# Patient Record
Sex: Female | Born: 1955
Health system: Southern US, Community
[De-identification: ages and names within clinical notes are randomized; demographics above are authoritative.]

## PROBLEM LIST (undated history)

## (undated) ENCOUNTER — Ambulatory Visit: Admission: EM | Source: Home / Self Care

## (undated) DIAGNOSIS — F329 Major depressive disorder, single episode, unspecified: Secondary | ICD-10-CM

## (undated) DIAGNOSIS — K219 Gastro-esophageal reflux disease without esophagitis: Secondary | ICD-10-CM

## (undated) DIAGNOSIS — J449 Chronic obstructive pulmonary disease, unspecified: Secondary | ICD-10-CM

## (undated) DIAGNOSIS — I739 Peripheral vascular disease, unspecified: Secondary | ICD-10-CM

## (undated) DIAGNOSIS — F32A Depression, unspecified: Secondary | ICD-10-CM

## (undated) DIAGNOSIS — C801 Malignant (primary) neoplasm, unspecified: Secondary | ICD-10-CM

## (undated) DIAGNOSIS — E785 Hyperlipidemia, unspecified: Secondary | ICD-10-CM

## (undated) DIAGNOSIS — R7303 Prediabetes: Secondary | ICD-10-CM

## (undated) DIAGNOSIS — M1990 Unspecified osteoarthritis, unspecified site: Secondary | ICD-10-CM

## (undated) DIAGNOSIS — I7409 Other arterial embolism and thrombosis of abdominal aorta: Secondary | ICD-10-CM

## (undated) DIAGNOSIS — I10 Essential (primary) hypertension: Secondary | ICD-10-CM

## (undated) HISTORY — PX: BREAST LUMPECTOMY: SHX2

## (undated) HISTORY — DX: Peripheral vascular disease, unspecified: I73.9

## (undated) HISTORY — DX: Other arterial embolism and thrombosis of abdominal aorta: I74.09

## (undated) HISTORY — PX: BREAST SURGERY: SHX581

---

## 1978-03-03 HISTORY — PX: APPENDECTOMY: SHX54

## 1989-03-03 HISTORY — PX: TUBAL LIGATION: SHX77

## 1999-06-29 ENCOUNTER — Encounter: Payer: Self-pay | Admitting: *Deleted

## 1999-06-29 ENCOUNTER — Emergency Department (HOSPITAL_COMMUNITY): Admission: EM | Admit: 1999-06-29 | Discharge: 1999-06-29 | Payer: Self-pay | Admitting: *Deleted

## 1999-11-10 ENCOUNTER — Emergency Department (HOSPITAL_COMMUNITY): Admission: EM | Admit: 1999-11-10 | Discharge: 1999-11-10 | Payer: Self-pay | Admitting: Internal Medicine

## 2006-08-24 ENCOUNTER — Emergency Department (HOSPITAL_COMMUNITY): Admission: EM | Admit: 2006-08-24 | Discharge: 2006-08-24 | Payer: Self-pay | Admitting: Emergency Medicine

## 2006-10-15 ENCOUNTER — Emergency Department (HOSPITAL_COMMUNITY): Admission: EM | Admit: 2006-10-15 | Discharge: 2006-10-15 | Payer: Self-pay | Admitting: Emergency Medicine

## 2006-11-26 ENCOUNTER — Ambulatory Visit: Payer: Self-pay | Admitting: Internal Medicine

## 2006-11-26 ENCOUNTER — Ambulatory Visit: Payer: Self-pay | Admitting: *Deleted

## 2006-11-26 LAB — CONVERTED CEMR LAB
ALT: 15 units/L (ref 0–35)
AST: 12 units/L (ref 0–37)
Albumin: 4.1 g/dL (ref 3.5–5.2)
Alkaline Phosphatase: 59 units/L (ref 39–117)
BUN: 18 mg/dL (ref 6–23)
CO2: 24 meq/L (ref 19–32)
Calcium: 9.3 mg/dL (ref 8.4–10.5)
Chloride: 107 meq/L (ref 96–112)
Cholesterol: 201 mg/dL — ABNORMAL HIGH (ref 0–200)
Creatinine, Ser: 0.71 mg/dL (ref 0.40–1.20)
Glucose, Bld: 86 mg/dL (ref 70–99)
HDL: 56 mg/dL (ref >39)
LDL Cholesterol: 126 mg/dL — ABNORMAL HIGH (ref 0–99)
Potassium: 4.1 meq/L (ref 3.5–5.3)
Sodium: 140 meq/L (ref 135–145)
TSH: 1.03 microintl units/mL (ref 0.350–5.50)
Total Bilirubin: 0.5 mg/dL (ref 0.3–1.2)
Total CHOL/HDL Ratio: 3.6
Total Protein: 7.2 g/dL (ref 6.0–8.3)
Triglycerides: 94 mg/dL (ref <150)
VLDL: 19 mg/dL (ref 0–40)

## 2007-01-23 ENCOUNTER — Emergency Department (HOSPITAL_COMMUNITY): Admission: EM | Admit: 2007-01-23 | Discharge: 2007-01-23 | Payer: Self-pay | Admitting: Emergency Medicine

## 2007-02-17 ENCOUNTER — Ambulatory Visit: Payer: Self-pay | Admitting: Internal Medicine

## 2007-03-04 DIAGNOSIS — C801 Malignant (primary) neoplasm, unspecified: Secondary | ICD-10-CM

## 2007-03-04 HISTORY — DX: Malignant (primary) neoplasm, unspecified: C80.1

## 2007-04-08 ENCOUNTER — Ambulatory Visit (HOSPITAL_COMMUNITY): Admission: RE | Admit: 2007-04-08 | Discharge: 2007-04-08 | Payer: Self-pay | Admitting: Family Medicine

## 2007-05-17 ENCOUNTER — Encounter: Admission: RE | Admit: 2007-05-17 | Discharge: 2007-05-17 | Payer: Self-pay | Admitting: Family Medicine

## 2007-06-09 ENCOUNTER — Ambulatory Visit: Payer: Self-pay | Admitting: Internal Medicine

## 2007-06-11 ENCOUNTER — Encounter: Admission: RE | Admit: 2007-06-11 | Discharge: 2007-06-11 | Payer: Self-pay | Admitting: Family Medicine

## 2007-06-11 ENCOUNTER — Encounter (INDEPENDENT_AMBULATORY_CARE_PROVIDER_SITE_OTHER): Payer: Self-pay | Admitting: Radiology

## 2007-06-17 ENCOUNTER — Encounter: Payer: Self-pay | Admitting: Family Medicine

## 2007-06-17 ENCOUNTER — Ambulatory Visit: Payer: Self-pay | Admitting: Internal Medicine

## 2007-06-17 LAB — CONVERTED CEMR LAB
BUN: 18 mg/dL (ref 6–23)
CO2: 30 meq/L (ref 19–32)
Calcium: 9.6 mg/dL (ref 8.4–10.5)
Chloride: 101 meq/L (ref 96–112)
Creatinine, Ser: 0.86 mg/dL (ref 0.40–1.20)
Glucose, Bld: 87 mg/dL (ref 70–99)
Potassium: 3.1 meq/L — ABNORMAL LOW (ref 3.5–5.3)
Sodium: 143 meq/L (ref 135–145)

## 2007-06-24 ENCOUNTER — Encounter: Admission: RE | Admit: 2007-06-24 | Discharge: 2007-06-24 | Payer: Self-pay | Admitting: Family Medicine

## 2007-07-02 ENCOUNTER — Encounter: Admission: RE | Admit: 2007-07-02 | Discharge: 2007-07-02 | Payer: Self-pay | Admitting: General Surgery

## 2007-07-05 ENCOUNTER — Encounter (HOSPITAL_BASED_OUTPATIENT_CLINIC_OR_DEPARTMENT_OTHER): Payer: Self-pay | Admitting: General Surgery

## 2007-07-05 ENCOUNTER — Encounter: Admission: RE | Admit: 2007-07-05 | Discharge: 2007-07-05 | Payer: Self-pay | Admitting: General Surgery

## 2007-07-05 ENCOUNTER — Ambulatory Visit (HOSPITAL_BASED_OUTPATIENT_CLINIC_OR_DEPARTMENT_OTHER): Admission: RE | Admit: 2007-07-05 | Discharge: 2007-07-05 | Payer: Self-pay | Admitting: General Surgery

## 2007-07-21 ENCOUNTER — Ambulatory Visit: Payer: Self-pay | Admitting: Internal Medicine

## 2007-07-27 ENCOUNTER — Ambulatory Visit: Admission: RE | Admit: 2007-07-27 | Discharge: 2007-10-12 | Payer: Self-pay | Admitting: Radiation Oncology

## 2007-08-16 ENCOUNTER — Ambulatory Visit: Payer: Self-pay | Admitting: Oncology

## 2007-08-16 LAB — CBC WITH DIFFERENTIAL/PLATELET
BASO%: 0.5 % (ref 0.0–2.0)
Basophils Absolute: 0 10*3/uL (ref 0.0–0.1)
EOS%: 2 % (ref 0.0–7.0)
Eosinophils Absolute: 0.1 10*3/uL (ref 0.0–0.5)
HCT: 34.7 % — ABNORMAL LOW (ref 34.8–46.6)
HGB: 12.1 g/dL (ref 11.6–15.9)
LYMPH%: 34 % (ref 14.0–48.0)
MCH: 32 pg (ref 26.0–34.0)
MCHC: 34.8 g/dL (ref 32.0–36.0)
MCV: 92 fL (ref 81.0–101.0)
MONO#: 0.8 10*3/uL (ref 0.1–0.9)
MONO%: 12.4 % (ref 0.0–13.0)
NEUT#: 3.1 10*3/uL (ref 1.5–6.5)
NEUT%: 51.1 % (ref 39.6–76.8)
Platelets: 257 10*3/uL (ref 145–400)
RBC: 3.77 10*6/uL (ref 3.70–5.32)
RDW: 13.7 % (ref 11.3–14.5)
WBC: 6.1 10*3/uL (ref 3.9–10.0)
lymph#: 2.1 10*3/uL (ref 0.9–3.3)

## 2007-08-17 LAB — COMPREHENSIVE METABOLIC PANEL
ALT: 41 U/L — ABNORMAL HIGH (ref 0–35)
AST: 28 U/L (ref 0–37)
Albumin: 4.4 g/dL (ref 3.5–5.2)
Alkaline Phosphatase: 88 U/L (ref 39–117)
BUN: 15 mg/dL (ref 6–23)
CO2: 24 mEq/L (ref 19–32)
Calcium: 9.9 mg/dL (ref 8.4–10.5)
Chloride: 104 mEq/L (ref 96–112)
Creatinine, Ser: 0.83 mg/dL (ref 0.40–1.20)
Glucose, Bld: 90 mg/dL (ref 70–99)
Potassium: 3.7 mEq/L (ref 3.5–5.3)
Sodium: 140 mEq/L (ref 135–145)
Total Bilirubin: 0.3 mg/dL (ref 0.3–1.2)
Total Protein: 7.6 g/dL (ref 6.0–8.3)

## 2007-08-17 LAB — CANCER ANTIGEN 27.29: CA 27.29: 40 U/mL — ABNORMAL HIGH (ref 0–39)

## 2007-08-17 LAB — LACTATE DEHYDROGENASE: LDH: 206 U/L (ref 94–250)

## 2007-08-17 LAB — VITAMIN D 25 HYDROXY (VIT D DEFICIENCY, FRACTURES): Vit D, 25-Hydroxy: 17 ng/mL — ABNORMAL LOW (ref 30–89)

## 2007-09-02 ENCOUNTER — Ambulatory Visit (HOSPITAL_COMMUNITY): Admission: RE | Admit: 2007-09-02 | Discharge: 2007-09-02 | Payer: Self-pay | Admitting: Family Medicine

## 2007-10-05 ENCOUNTER — Ambulatory Visit: Payer: Self-pay | Admitting: Internal Medicine

## 2007-10-05 ENCOUNTER — Encounter: Payer: Self-pay | Admitting: Family Medicine

## 2007-10-05 LAB — CONVERTED CEMR LAB
BUN: 19 mg/dL (ref 6–23)
CO2: 21 meq/L (ref 19–32)
Calcium: 9.3 mg/dL (ref 8.4–10.5)
Chloride: 105 meq/L (ref 96–112)
Creatinine, Ser: 0.92 mg/dL (ref 0.40–1.20)
Glucose, Bld: 108 mg/dL — ABNORMAL HIGH (ref 70–99)
Potassium: 3.8 meq/L (ref 3.5–5.3)
Sodium: 140 meq/L (ref 135–145)

## 2007-10-13 ENCOUNTER — Encounter: Payer: Self-pay | Admitting: Family Medicine

## 2007-10-13 ENCOUNTER — Ambulatory Visit: Payer: Self-pay | Admitting: Internal Medicine

## 2007-10-13 LAB — CONVERTED CEMR LAB
ALT: 74 units/L — ABNORMAL HIGH (ref 0–35)
AST: 44 units/L — ABNORMAL HIGH (ref 0–37)
Albumin: 4.6 g/dL (ref 3.5–5.2)
Alkaline Phosphatase: 113 units/L (ref 39–117)
BUN: 12 mg/dL (ref 6–23)
Basophils Absolute: 0.1 10*3/uL (ref 0.0–0.1)
Basophils Relative: 2 % — ABNORMAL HIGH (ref 0–1)
CO2: 23 meq/L (ref 19–32)
Calcium: 9.8 mg/dL (ref 8.4–10.5)
Chloride: 101 meq/L (ref 96–112)
Cholesterol: 222 mg/dL — ABNORMAL HIGH (ref 0–200)
Creatinine, Ser: 0.9 mg/dL (ref 0.40–1.20)
Eosinophils Absolute: 0.1 10*3/uL (ref 0.0–0.7)
Eosinophils Relative: 4 % (ref 0–5)
Glucose, Bld: 92 mg/dL (ref 70–99)
HCT: 39.1 % (ref 36.0–46.0)
HDL: 58 mg/dL (ref >39)
Hemoglobin: 12.9 g/dL (ref 12.0–15.0)
LDL Cholesterol: 128 mg/dL — ABNORMAL HIGH (ref 0–99)
Lymphocytes Relative: 32 % (ref 12–46)
Lymphs Abs: 1.1 10*3/uL (ref 0.7–4.0)
MCHC: 33 g/dL (ref 30.0–36.0)
MCV: 96.3 fL (ref 78.0–100.0)
Monocytes Absolute: 0.7 10*3/uL (ref 0.1–1.0)
Monocytes Relative: 20 % — ABNORMAL HIGH (ref 3–12)
Neutro Abs: 1.5 10*3/uL — ABNORMAL LOW (ref 1.7–7.7)
Neutrophils Relative %: 42 % — ABNORMAL LOW (ref 43–77)
Platelets: 238 10*3/uL (ref 150–400)
Potassium: 4.1 meq/L (ref 3.5–5.3)
RBC: 4.06 M/uL (ref 3.87–5.11)
RDW: 15.4 % (ref 11.5–15.5)
Sed Rate: 15 mm/hr (ref 0–22)
Sodium: 138 meq/L (ref 135–145)
TSH: 3.096 microintl units/mL (ref 0.350–4.50)
Total Bilirubin: 0.5 mg/dL (ref 0.3–1.2)
Total CHOL/HDL Ratio: 3.8
Total Protein: 8.2 g/dL (ref 6.0–8.3)
Triglycerides: 179 mg/dL — ABNORMAL HIGH (ref <150)
VLDL: 36 mg/dL (ref 0–40)
WBC: 3.4 10*3/uL — ABNORMAL LOW (ref 4.0–10.5)

## 2007-11-09 ENCOUNTER — Ambulatory Visit: Payer: Self-pay | Admitting: Oncology

## 2008-01-21 ENCOUNTER — Ambulatory Visit: Payer: Self-pay | Admitting: Internal Medicine

## 2008-04-11 ENCOUNTER — Encounter: Admission: RE | Admit: 2008-04-11 | Discharge: 2008-04-11 | Payer: Self-pay | Admitting: Oncology

## 2008-04-19 ENCOUNTER — Ambulatory Visit: Payer: Self-pay | Admitting: Family Medicine

## 2008-05-02 ENCOUNTER — Ambulatory Visit: Payer: Self-pay | Admitting: Oncology

## 2008-06-12 ENCOUNTER — Ambulatory Visit (HOSPITAL_COMMUNITY): Admission: RE | Admit: 2008-06-12 | Discharge: 2008-06-12 | Payer: Self-pay | Admitting: Oncology

## 2008-06-12 LAB — CBC WITH DIFFERENTIAL/PLATELET
BASO%: 0.7 % (ref 0.0–2.0)
Basophils Absolute: 0 10*3/uL (ref 0.0–0.1)
EOS%: 2.9 % (ref 0.0–7.0)
Eosinophils Absolute: 0.1 10*3/uL (ref 0.0–0.5)
HCT: 40.2 % (ref 34.8–46.6)
HGB: 13.8 g/dL (ref 11.6–15.9)
LYMPH%: 36.7 % (ref 14.0–49.7)
MCH: 30.9 pg (ref 25.1–34.0)
MCHC: 34.3 g/dL (ref 31.5–36.0)
MCV: 90.1 fL (ref 79.5–101.0)
MONO#: 0.6 10*3/uL (ref 0.1–0.9)
MONO%: 14.4 % — ABNORMAL HIGH (ref 0.0–14.0)
NEUT#: 1.9 10*3/uL (ref 1.5–6.5)
NEUT%: 45.3 % (ref 38.4–76.8)
Platelets: 230 10*3/uL (ref 145–400)
RBC: 4.46 10*6/uL (ref 3.70–5.45)
RDW: 13.9 % (ref 11.2–14.5)
WBC: 4.2 10*3/uL (ref 3.9–10.3)
lymph#: 1.5 10*3/uL (ref 0.9–3.3)
nRBC: 0 % (ref 0–0)

## 2008-06-13 LAB — COMPREHENSIVE METABOLIC PANEL
ALT: 21 U/L (ref 0–35)
AST: 20 U/L (ref 0–37)
Albumin: 4.5 g/dL (ref 3.5–5.2)
Alkaline Phosphatase: 89 U/L (ref 39–117)
BUN: 17 mg/dL (ref 6–23)
CO2: 26 mEq/L (ref 19–32)
Calcium: 9.7 mg/dL (ref 8.4–10.5)
Chloride: 102 mEq/L (ref 96–112)
Creatinine, Ser: 0.98 mg/dL (ref 0.40–1.20)
Glucose, Bld: 87 mg/dL (ref 70–99)
Potassium: 3.6 mEq/L (ref 3.5–5.3)
Sodium: 138 mEq/L (ref 135–145)
Total Bilirubin: 0.3 mg/dL (ref 0.3–1.2)
Total Protein: 7.8 g/dL (ref 6.0–8.3)

## 2008-06-13 LAB — CANCER ANTIGEN 27.29: CA 27.29: 45 U/mL — ABNORMAL HIGH (ref 0–39)

## 2008-06-13 LAB — VITAMIN D 25 HYDROXY (VIT D DEFICIENCY, FRACTURES): Vit D, 25-Hydroxy: 13 ng/mL — ABNORMAL LOW (ref 30–89)

## 2008-06-13 LAB — LACTATE DEHYDROGENASE: LDH: 186 U/L (ref 94–250)

## 2008-06-14 ENCOUNTER — Ambulatory Visit: Payer: Self-pay | Admitting: Internal Medicine

## 2008-12-27 ENCOUNTER — Ambulatory Visit: Payer: Self-pay | Admitting: Oncology

## 2009-03-05 ENCOUNTER — Ambulatory Visit: Payer: Self-pay | Admitting: Oncology

## 2010-07-16 NOTE — Op Note (Signed)
Brandy Stevenson, Brandy Stevenson             ACCOUNT NO.:  [id number]   MEDICAL RECORD NO.:  [id number]          PATIENT TYPE:  AMB   LOCATION:  DSC                          FACILITY:  [facility]   PHYSICIAN:  [name], M.D.   DATE OF BIRTH:  [birth year]   DATE OF PROCEDURE:  07/05/2007  DATE OF DISCHARGE:                               OPERATIVE REPORT   PREOPERATIVE DIAGNOSIS:  Carcinoma of the left breast (ductal carcinoma  in situ).   POSTOPERATIVE DIAGNOSIS:  Carcinoma of the left breast (ductal carcinoma  in situ).   PATHOLOGY:  Pending.   PROCEDURE:  Needle localized lumpectomy, left breast.   SURGEON:  [name], MD   ASSISTANT:  OR nurse.   ANESTHESIA:  General.   The patient is a 55 year old female on routine screening mammogram was  noted to have an area of calcifications and distortion of the left  breast which on biopsy shows ductal carcinoma in situ.  The patient  comes to the operating room now for excision of this area after the  risks and potential benefits of surgery have been discussed.  All  questions answered and consent obtained.   The patient is positioned supine and following the induction of  satisfactory general anesthesia, the left breast is prepped and draped  to be included in a sterile operative field.  Positive identification of  the patient as Brandy Stevenson and the side as left side was carried  out.  The  localizing needle having been in place, I made a transverse  incision taking an ellipse of skin around the localizing needle, deep  into the skin and subcutaneous tissue, carrying the border all the way  down toward the chest wall.  Wide margins was taken around the needle  and the entire mass removed and x-rayed.  X-rays result showed the mass  to be well within the local area of the carcinoma.  Hemostasis was then  secured with electrocautery.  The breast was then irrigated with normal  saline.  Sponge and instrument counts were verified.   The breast tissues  were reapproximated with 2-0 Vicryl and 3-0 Vicryl.  The skin was closed  with running 5-0 Monocryl and reinforced with Steri-Strips.  Sterile  dressings were applied.  The anesthetic reversed.  The patient removed  from the operating room to the recovery room in stable condition,  tolerated the procedure well.      [name], M.D.  Electronically Signed     PB/MEDQ  D:  07/05/2007  T:  07/06/2007  Job:  [id number]

## 2010-11-27 LAB — COMPREHENSIVE METABOLIC PANEL
ALT: 128 — ABNORMAL HIGH
AST: 85 — ABNORMAL HIGH
Albumin: 3.9
Alkaline Phosphatase: 99
BUN: 11
CO2: 31
Calcium: 9.7
Chloride: 100
Creatinine, Ser: 0.76
GFR calc Af Amer: >60
GFR calc non Af Amer: >60
Glucose, Bld: 76
Potassium: 2.9 — ABNORMAL LOW
Sodium: 138
Total Bilirubin: 0.4
Total Protein: 7.7

## 2010-11-27 LAB — DIFFERENTIAL
Basophils Absolute: 0
Basophils Relative: 1
Eosinophils Absolute: 0.2
Eosinophils Relative: 3
Lymphocytes Relative: 36
Lymphs Abs: 2.3
Monocytes Absolute: 0.8
Monocytes Relative: 13 — ABNORMAL HIGH
Neutro Abs: 3.2
Neutrophils Relative %: 49

## 2010-11-27 LAB — CBC
HCT: 38.1
Hemoglobin: 12.9
MCHC: 33.8
MCV: 94.4
Platelets: 238
RBC: 4.03
RDW: 13.4
WBC: 6.5

## 2010-12-10 LAB — WOUND CULTURE: Gram Stain: NONE SEEN

## 2010-12-18 LAB — URINE CULTURE: Colony Count: 7000

## 2010-12-18 LAB — URINE MICROSCOPIC-ADD ON

## 2010-12-18 LAB — URINALYSIS, ROUTINE W REFLEX MICROSCOPIC
Bilirubin Urine: NEGATIVE
Glucose, UA: NEGATIVE
Hgb urine dipstick: NEGATIVE
Ketones, ur: NEGATIVE
Nitrite: NEGATIVE
Protein, ur: NEGATIVE
Specific Gravity, Urine: 1.021
Urobilinogen, UA: 1
pH: 6

## 2011-03-04 DIAGNOSIS — I739 Peripheral vascular disease, unspecified: Secondary | ICD-10-CM

## 2011-03-04 HISTORY — DX: Peripheral vascular disease, unspecified: I73.9

## 2011-05-22 ENCOUNTER — Telehealth: Payer: Self-pay | Admitting: *Deleted

## 2011-05-22 NOTE — Telephone Encounter (Signed)
left voice message to inform the patient of the new date and time on 06-05-2011 at 1:30pm with dr.rubin

## 2011-06-05 ENCOUNTER — Ambulatory Visit: Payer: Self-pay | Admitting: Oncology

## 2011-06-25 ENCOUNTER — Ambulatory Visit: Payer: Self-pay | Admitting: Oncology

## 2011-06-25 ENCOUNTER — Other Ambulatory Visit: Payer: Self-pay | Admitting: Lab

## 2011-07-02 ENCOUNTER — Encounter (HOSPITAL_COMMUNITY): Payer: Self-pay | Admitting: *Deleted

## 2011-07-02 ENCOUNTER — Emergency Department (HOSPITAL_COMMUNITY)
Admission: EM | Admit: 2011-07-02 | Discharge: 2011-07-02 | Disposition: A | Discharge status: Home / Self Care | Payer: Self-pay | Attending: Emergency Medicine | Admitting: Emergency Medicine

## 2011-07-02 DIAGNOSIS — Z79899 Other long term (current) drug therapy: Secondary | ICD-10-CM | POA: Insufficient documentation

## 2011-07-02 DIAGNOSIS — F17200 Nicotine dependence, unspecified, uncomplicated: Secondary | ICD-10-CM | POA: Insufficient documentation

## 2011-07-02 DIAGNOSIS — I10 Essential (primary) hypertension: Secondary | ICD-10-CM | POA: Insufficient documentation

## 2011-07-02 DIAGNOSIS — J4489 Other specified chronic obstructive pulmonary disease: Secondary | ICD-10-CM | POA: Insufficient documentation

## 2011-07-02 DIAGNOSIS — E785 Hyperlipidemia, unspecified: Secondary | ICD-10-CM | POA: Insufficient documentation

## 2011-07-02 DIAGNOSIS — J449 Chronic obstructive pulmonary disease, unspecified: Secondary | ICD-10-CM | POA: Insufficient documentation

## 2011-07-02 HISTORY — DX: Chronic obstructive pulmonary disease, unspecified: J44.9

## 2011-07-02 HISTORY — DX: Malignant (primary) neoplasm, unspecified: C80.1

## 2011-07-02 HISTORY — DX: Essential (primary) hypertension: I10

## 2011-07-02 HISTORY — DX: Hyperlipidemia, unspecified: E78.5

## 2011-07-02 LAB — POCT I-STAT, CHEM 8
BUN: 13 mg/dL (ref 6–23)
Calcium, Ion: 1.1 mmol/L — ABNORMAL LOW (ref 1.12–1.32)
Chloride: 106 mEq/L (ref 96–112)
Creatinine, Ser: 0.9 mg/dL (ref 0.50–1.10)
Glucose, Bld: 89 mg/dL (ref 70–99)
HCT: 36 % (ref 36.0–46.0)
Hemoglobin: 12.2 g/dL (ref 12.0–15.0)
Potassium: 3.6 mEq/L (ref 3.5–5.1)
Sodium: 140 mEq/L (ref 135–145)
TCO2: 27 mmol/L (ref 0–100)

## 2011-07-02 MED ORDER — OXYCODONE-ACETAMINOPHEN 5-325 MG PO TABS
1.0000 | ORAL_TABLET | Freq: Once | ORAL | Status: AC
  Administered 2011-07-02: 1 via ORAL
  Filled 2011-07-02: qty 1

## 2011-07-02 NOTE — ED Notes (Addendum)
Pt states she has no pain and does not know why she was sent here she is confused but answers questions

## 2011-07-02 NOTE — ED Notes (Signed)
Felt bad for about a week, went to MD yesterday, treated for HTN, increased meds for bp,

## 2011-07-02 NOTE — Discharge Instructions (Signed)
Followup with your doctor this week for your high blood pressure and possible medication adjustment Arterial Hypertension Arterial hypertension (high blood pressure) is a condition of elevated pressure in your blood vessels. Hypertension over a long period of time is a risk factor for strokes, heart attacks, and heart failure. It is also the leading cause of kidney (renal) failure.  CAUSES   In Adults -- Over 90% of all hypertension has no known cause. This is called essential or primary hypertension. In the other 10% of people with hypertension, the increase in blood pressure is caused by another disorder. This is called secondary hypertension. Important causes of secondary hypertension are:   Heavy alcohol use.   Obstructive sleep apnea.   Hyperaldosterosim (Conn's syndrome).   Steroid use.   Chronic kidney failure.   Hyperparathyroidism.   Medications.   Renal artery stenosis.   Pheochromocytoma.   Cushing's disease.   Coarctation of the aorta.   Scleroderma renal crisis.   Licorice (in excessive amounts).   Drugs (cocaine, methamphetamine).  Your caregiver can explain any items above that apply to you.  In Children -- Secondary hypertension is more common and should always be considered.   Pregnancy -- Few women of childbearing age have high blood pressure. However, up to 10% of them develop hypertension of pregnancy. Generally, this will not harm the woman. It may be a sign of 3 complications of pregnancy: preeclampsia, HELLP syndrome, and eclampsia. Follow up and control with medication is necessary.  SYMPTOMS   This condition normally does not produce any noticeable symptoms. It is usually found during a routine exam.   Malignant hypertension is a late problem of high blood pressure. It may have the following symptoms:   Headaches.   Blurred vision.   End-organ damage (this means your kidneys, heart, lungs, and other organs are being damaged).   Stressful  situations can increase the blood pressure. If a person with normal blood pressure has their blood pressure go up while being seen by their caregiver, this is often termed "white coat hypertension." Its importance is not known. It may be related with eventually developing hypertension or complications of hypertension.   Hypertension is often confused with mental tension, stress, and anxiety.  DIAGNOSIS  The diagnosis is made by 3 separate blood pressure measurements. They are taken at least 1 week apart from each other. If there is organ damage from hypertension, the diagnosis may be made without repeat measurements. Hypertension is usually identified by having blood pressure readings:  Above 140/90 mmHg measured in both arms, at 3 separate times, over a couple weeks.   Over 130/80 mmHg should be considered a risk factor and may require treatment in patients with diabetes.  Blood pressure readings over 120/80 mmHg are called "pre-hypertension" even in non-diabetic patients. To get a true blood pressure measurement, use the following guidelines. Be aware of the factors that can alter blood pressure readings.  Take measurements at least 1 hour after caffeine.   Take measurements 30 minutes after smoking and without any stress. This is another reason to quit smoking - it raises your blood pressure.   Use a proper cuff size. Ask your caregiver if you are not sure about your cuff size.   Most home blood pressure cuffs are automatic. They will measure systolic and diastolic pressures. The systolic pressure is the pressure reading at the start of sounds. Diastolic pressure is the pressure at which the sounds disappear. If you are elderly, measure pressures in multiple  postures. Try sitting, lying or standing.   Sit at rest for a minimum of 5 minutes before taking measurements.   You should not be on any medications like decongestants. These are found in many cold medications.   Record your blood  pressure readings and review them with your caregiver.  If you have hypertension:  Your caregiver may do tests to be sure you do not have secondary hypertension (see "causes" above).   Your caregiver may also look for signs of metabolic syndrome. This is also called Syndrome X or Insulin Resistance Syndrome. You may have this syndrome if you have type 2 diabetes, abdominal obesity, and abnormal blood lipids in addition to hypertension.   Your caregiver will take your medical and family history and perform a physical exam.   Diagnostic tests may include blood tests (for glucose, cholesterol, potassium, and kidney function), a urinalysis, or an EKG. Other tests may also be necessary depending on your condition.  PREVENTION  There are important lifestyle issues that you can adopt to reduce your chance of developing hypertension:  Maintain a normal weight.   Limit the amount of salt (sodium) in your diet.   Exercise often.   Limit alcohol intake.   Get enough potassium in your diet. Discuss specific advice with your caregiver.   Follow a DASH diet (dietary approaches to stop hypertension). This diet is rich in fruits, vegetables, and low-fat dairy products, and avoids certain fats.  PROGNOSIS  Essential hypertension cannot be cured. Lifestyle changes and medical treatment can lower blood pressure and reduce complications. The prognosis of secondary hypertension depends on the underlying cause. Many people whose hypertension is controlled with medicine or lifestyle changes can live a normal, healthy life.  RISKS AND COMPLICATIONS  While high blood pressure alone is not an illness, it often requires treatment due to its short- and long-term effects on many organs. Hypertension increases your risk for:  CVAs or strokes (cerebrovascular accident).   Heart failure due to chronically high blood pressure (hypertensive cardiomyopathy).   Heart attack (myocardial infarction).   Damage to the  retina (hypertensive retinopathy).   Kidney failure (hypertensive nephropathy).  Your caregiver can explain list items above that apply to you. Treatment of hypertension can significantly reduce the risk of complications. TREATMENT   For overweight patients, weight loss and regular exercise are recommended. Physical fitness lowers blood pressure.   Mild hypertension is usually treated with diet and exercise. A diet rich in fruits and vegetables, fat-free dairy products, and foods low in fat and salt (sodium) can help lower blood pressure. Decreasing salt intake decreases blood pressure in a 1/3 of people.   Stop smoking if you are a smoker.  The steps above are highly effective in reducing blood pressure. While these actions are easy to suggest, they are difficult to achieve. Most patients with moderate or severe hypertension end up requiring medications to bring their blood pressure down to a normal level. There are several classes of medications for treatment. Blood pressure pills (antihypertensives) will lower blood pressure by their different actions. Lowering the blood pressure by 10 mmHg may decrease the risk of complications by as much as 25%. The goal of treatment is effective blood pressure control. This will reduce your risk for complications. Your caregiver will help you determine the best treatment for you according to your lifestyle. What is excellent treatment for one person, may not be for you. HOME CARE INSTRUCTIONS   Do not smoke.   Follow the lifestyle changes  outlined in the "Prevention" section.   If you are on medications, follow the directions carefully. Blood pressure medications must be taken as prescribed. Skipping doses reduces their benefit. It also puts you at risk for problems.   Follow up with your caregiver, as directed.   If you are asked to monitor your blood pressure at home, follow the guidelines in the "Diagnosis" section above.  SEEK MEDICAL CARE IF:    You think you are having medication side effects.   You have recurrent headaches or lightheadedness.   You have swelling in your ankles.   You have trouble with your vision.  SEEK IMMEDIATE MEDICAL CARE IF:   You have sudden onset of chest pain or pressure, difficulty breathing, or other symptoms of a heart attack.   You have a severe headache.   You have symptoms of a stroke (such as sudden weakness, difficulty speaking, difficulty walking).  MAKE SURE YOU:   Understand these instructions.   Will watch your condition.   Will get help right away if you are not doing well or get worse.  Document Released: 02/17/2005 Document Revised: 02/06/2011 Document Reviewed: 09/17/2006 [facility] Patient Information 2012 [place], [place].

## 2011-07-02 NOTE — ED Provider Notes (Signed)
History     CSN: [id number]  Arrival date & time 07/02/11  1756   First MD Initiated Contact with Patient 07/02/11 2021      Chief Complaint  Patient presents with  . Hypertension    (Consider location/radiation/quality/duration/timing/severity/associated sxs/prior treatment) Patient is a 56 y.o. female presenting with hypertension. The history is provided by the patient.  Hypertension   patient here with increased blood pressure x1 week. Patient saw her doctor yesterday and had a blood pressure medication adjusted complains that her blood pressure home at systolic of 200. She does have some dizziness and a slight headache but no vomiting or mental status changes. Denies any chest pain shortness of breath abdominal pain. She does feel better at this point  Past Medical History  Diagnosis Date  . Cancer   . Hypertension   . COPD (chronic obstructive pulmonary disease)   . Hyperlipemia     Past Surgical History  Procedure Date  . Tubal ligation   . Appendectomy   . Breast surgery     No family history on file.  History  Substance Use Topics  . Smoking status: Current Everyday Smoker  . Smokeless tobacco: Not on file  . Alcohol Use: No    OB History    Grav Para Term Preterm Abortions TAB SAB Ect Mult Living                  Review of Systems  All other systems reviewed and are negative.    Allergies  Review of patient's allergies indicates no known allergies.  Home Medications   Current Outpatient Rx  Name Route Sig Dispense Refill  . ALBUTEROL SULFATE HFA 108 (90 BASE) MCG/ACT IN AERS Inhalation Inhale 2 puffs into the lungs every 6 (six) hours as needed. For shortness of breath    . FLUTICASONE-SALMETEROL 100-50 MCG/DOSE IN AEPB Inhalation Inhale 1 puff into the lungs every 12 (twelve) hours.    . IBUPROFEN 100 MG PO TABS Oral Take 100-300 mg by mouth every 6 (six) hours as needed. For pain    . LISINOPRIL-HYDROCHLOROTHIAZIDE 20-12.5 MG PO TABS Oral  Take 1 tablet by mouth daily.    [name] PRAVASTATIN SODIUM 20 MG PO TABS Oral Take 20 mg by mouth daily.      BP 176/105  Pulse 59  Temp(Src) 98.4 F (36.9 C) (Oral)  Resp 18  SpO2 100%  Physical Exam  Nursing note and vitals reviewed. Constitutional: She is oriented to person, place, and time. She appears well-developed and well-nourished.  Non-toxic appearance. No distress.  HENT:  Head: Normocephalic and atraumatic.  Eyes: Conjunctivae, EOM and lids are normal. Pupils are equal, round, and reactive to light.  Neck: Normal range of motion. Neck supple. No tracheal deviation present. No mass present.  Cardiovascular: Normal rate, regular rhythm and normal heart sounds.  Exam reveals no gallop.   No murmur heard. Pulmonary/Chest: Effort normal and breath sounds normal. No stridor. No respiratory distress. She has no decreased breath sounds. She has no wheezes. She has no rhonchi. She has no rales.  Abdominal: Soft. Normal appearance and bowel sounds are normal. She exhibits no distension. There is no tenderness. There is no rebound and no CVA tenderness.  Musculoskeletal: Normal range of motion. She exhibits no edema and no tenderness.  Neurological: She is alert and oriented to person, place, and time. She has normal strength. No cranial nerve deficit or sensory deficit. GCS eye subscore is 4. GCS verbal subscore is 5.  GCS motor subscore is 6.  Skin: Skin is warm and dry. No abrasion and no rash noted.  Psychiatric: She has a normal mood and affect. Her speech is normal and behavior is normal.    ED Course  Procedures (including critical care time)  Labs Reviewed - No data to display No results found.   No diagnosis found.    MDM  Blood pressure has been stable here. Patient's renal function normal. Will follow up with Dr.        Toy Baker, MD 07/02/11 2133

## 2011-07-23 ENCOUNTER — Ambulatory Visit (HOSPITAL_BASED_OUTPATIENT_CLINIC_OR_DEPARTMENT_OTHER): Payer: Self-pay | Admitting: Oncology

## 2011-07-23 ENCOUNTER — Telehealth: Payer: Self-pay | Admitting: *Deleted

## 2011-07-23 VITALS — BP 118/76 | HR 66 | Temp 97.7°F | Ht 65.0 in | Wt 148.5 lb

## 2011-07-23 DIAGNOSIS — E559 Vitamin D deficiency, unspecified: Secondary | ICD-10-CM

## 2011-07-23 DIAGNOSIS — M25519 Pain in unspecified shoulder: Secondary | ICD-10-CM

## 2011-07-23 DIAGNOSIS — D0590 Unspecified type of carcinoma in situ of unspecified breast: Secondary | ICD-10-CM

## 2011-07-23 DIAGNOSIS — D0510 Intraductal carcinoma in situ of unspecified breast: Secondary | ICD-10-CM

## 2011-07-23 NOTE — Telephone Encounter (Signed)
gave patient appointment for 07-2012 printed out calendar and gave to the patient 

## 2011-07-23 NOTE — Progress Notes (Signed)
Hematology and Oncology Follow Up Visit  Brandy Stevenson [id number] Mar 25, 1955 56 y.o. 07/23/2011 6:50 PM   DIAGNOSIS: 56 year old woman a previous history of DCIS present diagnosed May 2009.  radiation therapy completed August 2009 previous use  of tamoxifen for approximately 2-1/2 years.  Encounter Diagnoses  Name Primary?  [name] DCIS (ductal carcinoma in situ) Yes  . Unspecified vitamin D deficiency      PAST THERAPY: As above   Interim History:  This patient was last seen office in April 2010., She had subsequently moved out of the city has returned. She moved to [facility] to help look after her mother. She is now living back in [place] where she is working as a Stage manager at a [employer]. Her last mammogram was apparently in November of 2012 and was felt to be normal. She denies been receiving mammograms every 6 months. Once again she had stopped taking tamoxifen after a few years. She has also developed some pain in her left shoulder and arm and was concerned that this was related to her previous diagnosis of breast cancer. Her local physician and [facility] Center had recommended followup with our practice. She has otherwise been doing well. She is continued on lisinopril Advair albuterol and a cholesterol-lowering agent. She continues to smoke approximately a pack per day.  Medications: I have reviewed the patient's current medications.  Allergies: No Known Allergies  Past Medical History, Surgical history, Social history, and Family History were reviewed and updated.  Review of Systems: Constitutional:  Negative for fever, chills, night sweats, anorexia, weight loss, pain. Cardiovascular: no chest pain or dyspnea on exertion Respiratory: negative Neurological: negative Dermatological: negative ENT: negative Skin Gastrointestinal: negative Genito-Urinary: negative Hematological and Lymphatic: negative Breast: negative Musculoskeletal: Positive for  pain and discomfort left arm shoulder especially with rotation. Remaining ROS negative.  Physical Exam:  Blood pressure 118/76, pulse 66, temperature 97.7 F (36.5 C), height 5\' 5"  (1.651 m), weight 148 lb 8 oz (67.359 kg).  ECOG: 0   General appearance: alert, cooperative and appears stated age Eyes: conjunctivae/corneas clear. PERRL, EOM's intact. Fundi benign. Throat: lips, mucosa, and tongue normal; teeth and gums normal Resp: clear to auscultation bilaterally and normal percussion bilaterally Breasts: normal appearance, no masses or tenderness, status status post lumpectomy left breast with radiation-related changes seen. No masses in either breast, no nipple retraction or Cardio: regular rate and rhythm, S1, S2 normal, no murmur, click, rub or gallop and normal apical impulse GI: soft, non-tender; bowel sounds normal; no masses,  no organomegaly Extremities: extremities normal, atraumatic, no cyanosis or edema, rotation of left arm limited on Pulses: 2+ and symmetric Skin: Skin color, texture, turgor normal. No rashes or lesions Lymph nodes: Cervical, supraclavicular, and axillary nodes normal.   Lab Results: Lab Results  Component Value Date   WBC 4.2 06/12/2008   HGB 12.2 07/02/2011   HCT 36.0 07/02/2011   MCV 90.1 06/12/2008   PLT 230 06/12/2008     Chemistry      Component Value Date/Time   NA 140 07/02/2011 2056   K 3.6 07/02/2011 2056   CL 106 07/02/2011 2056   CO2 26 06/12/2008 0939   BUN 13 07/02/2011 2056   CREATININE 0.90 07/02/2011 2056      Component Value Date/Time   CALCIUM 9.7 06/12/2008 0939   ALKPHOS 89 06/12/2008 0939   AST 20 06/12/2008 0939   ALT 21 06/12/2008 0939   BILITOT 0.3 06/12/2008 5621  Radiological Studies:  No results found.   IMPRESSIONS AND PLAN: A 56 y.o. female with   History of previous DCIS. She has not been taking her tamoxifen for about 2 years. At this point I do not see any abnormalities in her breast examination. She may need  to see an orthopedic doctor to evaluate her left arm and shoulder. She been some x-rays of that area as well. I have recommended that we obtain her x-rays and mammograms from her previous evaluation performed in November of 2012. I have scheduled her for followup mammogram in for November of 2013. I will plan to see her at that point. I'm happy see her any point prior to that.  Spent more than half the time coordinating care.    Brandy Stevenson 5/22/20136:50 PM

## 2011-10-02 ENCOUNTER — Emergency Department (HOSPITAL_COMMUNITY): Payer: Self-pay

## 2011-10-02 ENCOUNTER — Emergency Department (HOSPITAL_COMMUNITY)
Admission: EM | Admit: 2011-10-02 | Discharge: 2011-10-02 | Disposition: A | Discharge status: Home / Self Care | Payer: Self-pay | Attending: Emergency Medicine | Admitting: Emergency Medicine

## 2011-10-02 ENCOUNTER — Encounter (HOSPITAL_COMMUNITY): Payer: Self-pay | Admitting: *Deleted

## 2011-10-02 DIAGNOSIS — M62830 Muscle spasm of back: Secondary | ICD-10-CM

## 2011-10-02 DIAGNOSIS — J4489 Other specified chronic obstructive pulmonary disease: Secondary | ICD-10-CM | POA: Insufficient documentation

## 2011-10-02 DIAGNOSIS — J449 Chronic obstructive pulmonary disease, unspecified: Secondary | ICD-10-CM | POA: Insufficient documentation

## 2011-10-02 DIAGNOSIS — E785 Hyperlipidemia, unspecified: Secondary | ICD-10-CM | POA: Insufficient documentation

## 2011-10-02 DIAGNOSIS — IMO0002 Reserved for concepts with insufficient information to code with codable children: Secondary | ICD-10-CM | POA: Insufficient documentation

## 2011-10-02 DIAGNOSIS — F17200 Nicotine dependence, unspecified, uncomplicated: Secondary | ICD-10-CM | POA: Insufficient documentation

## 2011-10-02 DIAGNOSIS — T148XXA Other injury of unspecified body region, initial encounter: Secondary | ICD-10-CM

## 2011-10-02 DIAGNOSIS — X58XXXA Exposure to other specified factors, initial encounter: Secondary | ICD-10-CM | POA: Insufficient documentation

## 2011-10-02 DIAGNOSIS — I10 Essential (primary) hypertension: Secondary | ICD-10-CM | POA: Insufficient documentation

## 2011-10-02 MED ORDER — DIAZEPAM 2 MG PO TABS
2.0000 mg | ORAL_TABLET | Freq: Once | ORAL | Status: AC
  Administered 2011-10-02: 2 mg via ORAL
  Filled 2011-10-02: qty 1

## 2011-10-02 MED ORDER — HYDROCODONE-ACETAMINOPHEN 5-325 MG PO TABS
1.0000 | ORAL_TABLET | Freq: Four times a day (QID) | ORAL | Status: AC | PRN
Start: 2011-10-02 — End: 2011-10-12

## 2011-10-02 MED ORDER — DIAZEPAM 5 MG PO TABS: 5.0000 mg | ORAL_TABLET | Freq: Three times a day (TID) | ORAL | Status: AC | PRN

## 2011-10-02 NOTE — ED Notes (Signed)
Pt sts top of shoulder and adjacent to her back.  Tightness of on the shoulder.  Daughter sts that when pt breath its hard b/c due to the pain.  Sts pain has been hurting since this am.  Pt took motrin, and advil but still no relief.

## 2011-10-02 NOTE — ED Notes (Signed)
Patient transported to X-ray 

## 2011-10-02 NOTE — ED Notes (Signed)
Pt state she woke up this morning and started to have right shoulder and back pain. Pt states it feels stiff. Pt denies any injury

## 2011-10-02 NOTE — ED Provider Notes (Signed)
History     CSN: [id number]  Arrival date & time 10/02/11  1632   First MD Initiated Contact with Patient 10/02/11 1855      Chief Complaint  Patient presents with  . Back Pain  . Shoulder Pain    (Consider location/radiation/quality/duration/timing/severity/associated sxs/prior treatment) HPI Comments: Pt reports to triage with complaints of back and shoulder pain. Pt woke up this morning with a tightness in her right shoulderblade with radiation to the right upper chest.. She states that the pain is constant but does get worse with movement or palpation of her shoulder or deep inspiration. 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 She is right-handed. Pt's daughter reports that her mother asked her to look at her back and that she felt a "hardness" in the muscles over her mother's right shoulder blade.  Pt denies change in her normal sputum and cough from COPD and smoking. Patient denies any chest pain, shortness of breath, or fever. Patient denies any muscle weakness, pain radiation down the right arm, numbness, or tingling of the upper extremities. Denies abdominal pain or changes in bowel habits.  Pt has a history of COPD and breast cancer (left lumpectomy 2009 s/p radiation, no chemotherapy).    The history is provided by the patient and a relative.    Past Medical History  Diagnosis Date  . Cancer   . Hypertension   . COPD (chronic obstructive pulmonary disease)   . Hyperlipemia     Past Surgical History  Procedure Date  . Tubal ligation   . Appendectomy   . Breast surgery     No family history on file.  History  Substance Use Topics  . Smoking status: Current Everyday Smoker  . Smokeless tobacco: Not on file  . Alcohol Use: No    OB History    Grav Para Term Preterm Abortions TAB SAB Ect Mult Living                  Review of Systems  Constitutional: Negative for fever and chills.    Respiratory: Negative for cough and shortness of breath.   Gastrointestinal: Negative for abdominal pain.  Neurological: Negative for weakness and numbness.    Allergies  Review of patient's allergies indicates no known allergies.  Home Medications   Current Outpatient Rx  Name Route Sig Dispense Refill  . ALBUTEROL SULFATE HFA 108 (90 BASE) MCG/ACT IN AERS Inhalation Inhale 2 puffs into the lungs every 6 (six) hours as needed. For shortness of breath    . FLUTICASONE-SALMETEROL 100-50 MCG/DOSE IN AEPB Inhalation Inhale 1 puff into the lungs every 12 (twelve) hours.    [name] LISINOPRIL-HYDROCHLOROTHIAZIDE 20-12.5 MG PO TABS Oral Take 1 tablet by mouth daily.    [name] PRAVASTATIN SODIUM 20 MG PO TABS Oral Take 20 mg by mouth every evening.       BP 106/60  Pulse 55  Temp 97.8 F (36.6 C) (Oral)  Resp 22  Ht 5\' 5"  (1.651 m)  Physical Exam  Nursing note and vitals reviewed. Constitutional: She appears well-developed and well-nourished. No distress.  HENT:  Head: Normocephalic and atraumatic.  Neck: Neck supple.  Cardiovascular: Normal rate, regular rhythm and normal heart sounds.   Pulmonary/Chest: Effort normal and breath sounds normal. No respiratory distress. She has no wheezes. She has no rales.  Abdominal: Soft. She exhibits no distension and no mass. There is no tenderness.  There is no rebound and no guarding.  Musculoskeletal:       Cervical back: She exhibits no bony tenderness.       Thoracic back: She exhibits no bony tenderness.       Lumbar back: She exhibits no bony tenderness.       Back:  Lymphadenopathy:    She has no axillary adenopathy.  Neurological: She is alert.  Skin: She is not diaphoretic.    ED Course  Procedures (including critical care time)  Labs Reviewed - No data to display Dg Chest 2 View  10/02/2011  *RADIOLOGY REPORT*  Clinical Data: Chest pain, shortness of breath and cough.  CHEST - 2 VIEW  Comparison: 06/12/2008  Findings: The  cardiomediastinal silhouette is unremarkable. There is no evidence of focal airspace disease, pulmonary edema, suspicious pulmonary nodule/mass, pleural effusion, or pneumothorax. No acute bony abnormalities are identified.  IMPRESSION: No evidence of active cardiopulmonary disease.  Original Report Authenticated By: Rosendo Gros, M.D.    Date: 10/02/2011  Rate: 53  Rhythm: normal sinus rhythm  QRS Axis: normal  Intervals: normal  ST/T Wave abnormalities: normal  Conduction Disutrbances:none  Narrative Interpretation:   Old EKG Reviewed: unchanged     1. Muscle strain   2. Muscle spasm of back       MDM  Pt with increased activity using right arm, now with swelling and tenderness over right upper back.  Pain does radiate into her chest but is reproducible with palpation.  All pain is worse with twisting movement and palpation.  Not exertional.  CXR and EKG unremarkable.  Doubt ACS or pulmonary pathology.  Neurovascularly intact.  No radicular symptoms.  Likely muscle strain.  Pt d/c home with valium, norco.  Discussed all results with patient.  Pt given return precautions.  Pt verbalizes understanding and agrees with plan.           Malcolm, [place] 10/02/11 2205

## 2011-10-03 NOTE — ED Provider Notes (Signed)
Medical screening examination/treatment/procedure(s) were performed by non-physician practitioner and as supervising physician I was immediately available for consultation/collaboration.   Tanara Turvey Y. Wilhemina Grall, MD 10/03/11 1258 

## 2011-11-05 ENCOUNTER — Encounter (HOSPITAL_COMMUNITY): Payer: Self-pay | Admitting: *Deleted

## 2011-11-05 ENCOUNTER — Emergency Department (HOSPITAL_COMMUNITY)
Admission: EM | Admit: 2011-11-05 | Discharge: 2011-11-05 | Disposition: A | Discharge status: Home / Self Care | Payer: Self-pay | Attending: Emergency Medicine | Admitting: Emergency Medicine

## 2011-11-05 DIAGNOSIS — E785 Hyperlipidemia, unspecified: Secondary | ICD-10-CM | POA: Insufficient documentation

## 2011-11-05 DIAGNOSIS — Z859 Personal history of malignant neoplasm, unspecified: Secondary | ICD-10-CM | POA: Insufficient documentation

## 2011-11-05 DIAGNOSIS — I10 Essential (primary) hypertension: Secondary | ICD-10-CM | POA: Insufficient documentation

## 2011-11-05 DIAGNOSIS — J449 Chronic obstructive pulmonary disease, unspecified: Secondary | ICD-10-CM | POA: Insufficient documentation

## 2011-11-05 DIAGNOSIS — Z760 Encounter for issue of repeat prescription: Secondary | ICD-10-CM | POA: Insufficient documentation

## 2011-11-05 DIAGNOSIS — J4489 Other specified chronic obstructive pulmonary disease: Secondary | ICD-10-CM | POA: Insufficient documentation

## 2011-11-05 DIAGNOSIS — F17200 Nicotine dependence, unspecified, uncomplicated: Secondary | ICD-10-CM | POA: Insufficient documentation

## 2011-11-05 MED ORDER — ALBUTEROL SULFATE HFA 108 (90 BASE) MCG/ACT IN AERS
2.0000 | INHALATION_SPRAY | RESPIRATORY_TRACT | Status: DC | PRN
  Filled 2011-11-05: qty 6.7

## 2011-11-05 MED ORDER — HYDROCORTISONE 1 % EX CREA: TOPICAL_CREAM | CUTANEOUS | Status: DC

## 2011-11-05 MED ORDER — FLUTICASONE-SALMETEROL 100-50 MCG/DOSE IN AEPB: 1.0000 | INHALATION_SPRAY | Freq: Two times a day (BID) | RESPIRATORY_TRACT | Status: DC

## 2011-11-05 MED ORDER — PRAVASTATIN SODIUM 20 MG PO TABS: 20.0000 mg | ORAL_TABLET | Freq: Every evening | ORAL | Status: DC

## 2011-11-05 MED ORDER — LISINOPRIL-HYDROCHLOROTHIAZIDE 20-12.5 MG PO TABS: 1.0000 | ORAL_TABLET | Freq: Every day | ORAL | Status: DC

## 2011-11-05 NOTE — ED Notes (Signed)
Pt reports elevated BP x 2 weeks since she ran out of her meds.  Pt reports taking lisinopril for BP.  States that she used to go to [facility] for HTN meds.  Pt also reports that she has been having h/a and dizziness x 2 weeks.  Pt admits to taking some of her friend's Lisinopril.

## 2011-11-05 NOTE — ED Provider Notes (Signed)
History     CSN: [id number]  Arrival date & time 11/05/11  4098   First MD Initiated Contact with Patient 11/05/11 [phone]      Chief Complaint  Patient presents with  . Hypertension    (Consider location/radiation/quality/duration/timing/severity/associated sxs/prior treatment) HPI Pt has been out of meds since [facility] closed. She complains only of ongoing pruritic scaly breast rash. She has been treated with a cream in the past with good effect. She does not know the name of the cream. No current HA, visual changes, focal weakness. BP has improved since this AM.  Past Medical History  Diagnosis Date  . Cancer   . Hypertension   . COPD (chronic obstructive pulmonary disease)   . Hyperlipemia     Past Surgical History  Procedure Date  . Tubal ligation   . Appendectomy   . Breast surgery     No family history on file.  History  Substance Use Topics  . Smoking status: Current Everyday Smoker -- 1.0 packs/day  . Smokeless tobacco: Not on file  . Alcohol Use: No    OB History    Grav Para Term Preterm Abortions TAB SAB Ect Mult Living                  Review of Systems  Constitutional: Negative for fever, chills and fatigue.  HENT: Negative for neck pain.   Eyes: Negative for visual disturbance.  Respiratory: Negative for cough and shortness of breath.   Cardiovascular: Negative for chest pain.  Gastrointestinal: Negative for nausea, vomiting and abdominal pain.  Genitourinary: Negative for dysuria and frequency.  Musculoskeletal: Negative for myalgias, back pain and gait problem.  Skin: Positive for rash.  Neurological: Positive for light-headedness and headaches. Negative for weakness and numbness.    Allergies  Review of patient's allergies indicates no known allergies.  Home Medications   Current Outpatient Rx  Name Route Sig Dispense Refill  . ALBUTEROL SULFATE HFA 108 (90 BASE) MCG/ACT IN AERS Inhalation Inhale 2 puffs into the lungs every 6 (six)  hours as needed. For shortness of breath    . FLUTICASONE-SALMETEROL 100-50 MCG/DOSE IN AEPB Inhalation Inhale 1 puff into the lungs every 12 (twelve) hours. 60 each 2  . HYDROCORTISONE 1 % EX CREA  Apply to affected area 2 times daily 15 g 0  . LISINOPRIL-HYDROCHLOROTHIAZIDE 20-12.5 MG PO TABS Oral Take 1 tablet by mouth daily. 30 tablet 2  . PRAVASTATIN SODIUM 20 MG PO TABS Oral Take 1 tablet (20 mg total) by mouth every evening. 30 tablet 2    BP 160/97  Pulse 68  Temp 98.3 F (36.8 C) (Oral)  Resp 16  Ht 5\' 4"  (1.626 m)  Wt 144 lb 3.2 oz (65.409 kg)  BMI 24.75 kg/m2  SpO2 100%  Physical Exam  Nursing note and vitals reviewed. Constitutional: She is oriented to person, place, and time. She appears well-developed and well-nourished. No distress.  HENT:  Head: Normocephalic and atraumatic.  Mouth/Throat: Oropharynx is clear and moist.  Eyes: EOM are normal. Pupils are equal, round, and reactive to light.  Neck: Normal range of motion. Neck supple.  Cardiovascular: Normal rate and regular rhythm.   Pulmonary/Chest: Effort normal and breath sounds normal. No respiratory distress. She has no wheezes. She has no rales.  Abdominal: Soft. Bowel sounds are normal. There is no tenderness. There is no rebound.  Musculoskeletal: Normal range of motion. She exhibits no edema and no tenderness.  Neurological: She is alert and  oriented to person, place, and time.       5/5 motor,sensation intact, gait normal  Skin: Skin is warm and dry. Rash (Scaly rash on each breast. No evidence of infection. ) noted. No erythema.  Psychiatric: She has a normal mood and affect. Her behavior is normal.    ED Course  Procedures (including critical care time)  Labs Reviewed - No data to display No results found.   1. Hypertension   2. Medication refill       MDM  Will refill meds. Advised to return for worsening symptoms or any concerns        Loren Racer, MD 11/05/11 [phone]

## 2011-11-05 NOTE — ED Notes (Addendum)
Pt states she has been out of BP meds for 2 weeks.  States she took BP this am and it was 212/119.  BP has decreased since pt arrival.  Also complains of rash on breast.  Denies any other symptoms at this time.

## 2012-05-22 ENCOUNTER — Telehealth: Payer: Self-pay | Admitting: *Deleted

## 2012-05-22 NOTE — Telephone Encounter (Signed)
Left message for pt to return my call so I can reschedule her appt.  

## 2012-06-20 ENCOUNTER — Encounter (HOSPITAL_COMMUNITY): Payer: Self-pay | Admitting: *Deleted

## 2012-06-20 ENCOUNTER — Emergency Department (HOSPITAL_COMMUNITY)
Admission: EM | Admit: 2012-06-20 | Discharge: 2012-06-20 | Disposition: A | Discharge status: Home / Self Care | Payer: Self-pay | Attending: Emergency Medicine | Admitting: Emergency Medicine

## 2012-06-20 ENCOUNTER — Emergency Department (HOSPITAL_COMMUNITY): Payer: Self-pay

## 2012-06-20 DIAGNOSIS — I10 Essential (primary) hypertension: Secondary | ICD-10-CM | POA: Insufficient documentation

## 2012-06-20 DIAGNOSIS — J4489 Other specified chronic obstructive pulmonary disease: Secondary | ICD-10-CM | POA: Insufficient documentation

## 2012-06-20 DIAGNOSIS — Z862 Personal history of diseases of the blood and blood-forming organs and certain disorders involving the immune mechanism: Secondary | ICD-10-CM | POA: Insufficient documentation

## 2012-06-20 DIAGNOSIS — Z8639 Personal history of other endocrine, nutritional and metabolic disease: Secondary | ICD-10-CM | POA: Insufficient documentation

## 2012-06-20 DIAGNOSIS — M62838 Other muscle spasm: Secondary | ICD-10-CM | POA: Insufficient documentation

## 2012-06-20 DIAGNOSIS — Z79899 Other long term (current) drug therapy: Secondary | ICD-10-CM | POA: Insufficient documentation

## 2012-06-20 DIAGNOSIS — J449 Chronic obstructive pulmonary disease, unspecified: Secondary | ICD-10-CM | POA: Insufficient documentation

## 2012-06-20 DIAGNOSIS — F17200 Nicotine dependence, unspecified, uncomplicated: Secondary | ICD-10-CM | POA: Insufficient documentation

## 2012-06-20 DIAGNOSIS — Z8739 Personal history of other diseases of the musculoskeletal system and connective tissue: Secondary | ICD-10-CM | POA: Insufficient documentation

## 2012-06-20 DIAGNOSIS — Z853 Personal history of malignant neoplasm of breast: Secondary | ICD-10-CM | POA: Insufficient documentation

## 2012-06-20 MED ORDER — HYDROCODONE-ACETAMINOPHEN 5-325 MG PO TABS: 1.0000 | ORAL_TABLET | Freq: Four times a day (QID) | ORAL | Status: DC | PRN

## 2012-06-20 MED ORDER — IBUPROFEN 200 MG PO TABS
600.0000 mg | ORAL_TABLET | Freq: Once | ORAL | Status: AC
  Administered 2012-06-20: 600 mg via ORAL
  Filled 2012-06-20: qty 3

## 2012-06-20 MED ORDER — HYDROCODONE-ACETAMINOPHEN 5-325 MG PO TABS
2.0000 | ORAL_TABLET | Freq: Once | ORAL | Status: AC
  Administered 2012-06-20: 2 via ORAL
  Filled 2012-06-20: qty 2

## 2012-06-20 MED ORDER — IBUPROFEN 600 MG PO TABS: 600.0000 mg | ORAL_TABLET | Freq: Four times a day (QID) | ORAL | Status: DC | PRN

## 2012-06-20 MED ORDER — METHOCARBAMOL 500 MG PO TABS: 500.0000 mg | ORAL_TABLET | Freq: Two times a day (BID) | ORAL | Status: DC

## 2012-06-20 MED ORDER — DIAZEPAM 2 MG PO TABS
2.0000 mg | ORAL_TABLET | Freq: Once | ORAL | Status: AC
  Administered 2012-06-20: 2 mg via ORAL
  Filled 2012-06-20: qty 1

## 2012-06-20 NOTE — ED Notes (Signed)
Discharge instructions reviewed w/ pt., verbalizes understanding. Three prescriptions provided at discharge. 

## 2012-06-20 NOTE — ED Notes (Signed)
Patient transported to X-ray 

## 2012-06-20 NOTE — ED Notes (Signed)
Pt c/o neck pain starting this past Thursday and worsening gradually until she was woken from sleep at 0200 this morning with pain in the back of her head and neck.

## 2012-06-20 NOTE — ED Provider Notes (Signed)
History     CSN: [id number]  Arrival date & time 06/20/12  4098   First MD Initiated Contact with Patient 06/20/12 [phone]      Chief Complaint  Patient presents with  . Neck Pain    (Consider location/radiation/quality/duration/timing/severity/associated sxs/prior treatment) HPI Comments: Pt with past hx of cancer of the breast, now in remission, and COPD who comes in with cc of neck pain. Pt started having neck pain on Thursday. The pain is located on the right side of her neck, and shoots down towards the deltoid. There is no associated numbness, weakness, urinary incontinence, urinary retention, bowel incontinence. Pt's pain is worse with any movement of the neck, and also with the forward flexion of the upper extremities.    Patient is a 57 y.o. female presenting with neck pain. The history is provided by the patient.  Neck Pain Associated symptoms: no chest pain and no headaches     Past Medical History  Diagnosis Date  . Cancer   . Hypertension   . COPD (chronic obstructive pulmonary disease)   . Hyperlipemia     Past Surgical History  Procedure Laterality Date  . Tubal ligation    . Appendectomy    . Breast surgery      History reviewed. No pertinent family history.  History  Substance Use Topics  . Smoking status: Current Every Day Smoker -- 1.00 packs/day  . Smokeless tobacco: Not on file  . Alcohol Use: No    OB History   Grav Para Term Preterm Abortions TAB SAB Ect Mult Living                  Review of Systems  Constitutional: Negative for activity change.  HENT: Positive for neck pain and neck stiffness.   Respiratory: Negative for shortness of breath.   Cardiovascular: Negative for chest pain.  Gastrointestinal: Negative for nausea, vomiting and abdominal pain.  Genitourinary: Negative for dysuria.  Neurological: Negative for headaches.    Allergies  Review of patient's allergies indicates no known allergies.  Home Medications   Current  Outpatient Rx  Name  Route  Sig  Dispense  Refill  . ibuprofen (ADVIL,MOTRIN) 600 MG tablet   Oral   Take 600 mg by mouth every 6 (six) hours as needed for pain.         [name] lisinopril-hydrochlorothiazide (PRINZIDE,ZESTORETIC) 20-12.5 MG per tablet   Oral   Take 1 tablet by mouth daily.   30 tablet   2     BP 158/97  Pulse 55  Temp(Src) 98 F (36.7 C) (Oral)  Resp 20  SpO2 99%  Physical Exam  Constitutional: She is oriented to person, place, and time. She appears well-developed and well-nourished.  HENT:  Head: Normocephalic and atraumatic.  Eyes: EOM are normal. Pupils are equal, round, and reactive to light.  Neck:  Pt has no midline cspine tenderness. Pt has a visible spasm on the right side of the neck. The tenderness wi reproducible with palpation of the paraspinal muscles and with movement of the neck.  Cardiovascular: Normal rate, regular rhythm, normal heart sounds and intact distal pulses.   No murmur heard. Pulmonary/Chest: Effort normal. No respiratory distress.  Abdominal: Soft. She exhibits no distension. There is no tenderness. There is no rebound and no guarding.  Musculoskeletal: She exhibits no edema and no tenderness.  Neurological: She is alert and oriented to person, place, and time. No cranial nerve deficit.  Motor and sensory exam of the  upper extremity is normal.  Skin: Skin is warm and dry.    ED Course  Procedures (including critical care time)  Labs Reviewed - No data to display No results found.   No diagnosis found.    MDM  Pt comes in with cc of neck pain. Clinically the exam is consistent with muscular etiology - likely spasms. There is hx of cancer, osteoporosis, so we will get cspine films to ensure there is no lytic lesion or pathologic fractures.  Pt has no neuro deficits. Return precautions provided.   Derwood Kaplan, MD 06/20/12 1601

## 2012-06-24 ENCOUNTER — Telehealth: Payer: Self-pay | Admitting: *Deleted

## 2012-06-24 NOTE — Telephone Encounter (Signed)
Attempted to call patient but mobile number [phone] was the wrong number and her home # [phone] states voicemail has not been set up.  I was able to get in touch with Joanna Puff from her contact list.  He states he will give her the message and have her call us back to reschedule.  Awaiting patient response, I have cancelled her appt.

## 2012-07-09 ENCOUNTER — Telehealth: Payer: Self-pay | Admitting: *Deleted

## 2012-07-09 NOTE — Telephone Encounter (Signed)
Attempted to call patient again to reschedule her appt.  The mobile number listed is a wrong number.  Voicemail was not set up on her home number so I was unable to leave a message.

## 2012-07-16 ENCOUNTER — Other Ambulatory Visit: Payer: Self-pay | Admitting: Internal Medicine

## 2012-07-19 ENCOUNTER — Ambulatory Visit: Payer: Self-pay

## 2012-07-22 ENCOUNTER — Other Ambulatory Visit: Payer: Self-pay | Admitting: Lab

## 2012-07-22 ENCOUNTER — Ambulatory Visit: Payer: Self-pay | Admitting: Oncology

## 2012-08-17 ENCOUNTER — Emergency Department (HOSPITAL_COMMUNITY): Payer: Self-pay

## 2012-08-17 ENCOUNTER — Encounter (HOSPITAL_COMMUNITY): Payer: Self-pay | Admitting: *Deleted

## 2012-08-17 ENCOUNTER — Emergency Department (HOSPITAL_COMMUNITY)
Admission: EM | Admit: 2012-08-17 | Discharge: 2012-08-17 | Disposition: A | Discharge status: Home / Self Care | Payer: Self-pay | Attending: Emergency Medicine | Admitting: Emergency Medicine

## 2012-08-17 DIAGNOSIS — Z853 Personal history of malignant neoplasm of breast: Secondary | ICD-10-CM | POA: Insufficient documentation

## 2012-08-17 DIAGNOSIS — Z9889 Other specified postprocedural states: Secondary | ICD-10-CM | POA: Insufficient documentation

## 2012-08-17 DIAGNOSIS — R110 Nausea: Secondary | ICD-10-CM | POA: Insufficient documentation

## 2012-08-17 DIAGNOSIS — I10 Essential (primary) hypertension: Secondary | ICD-10-CM | POA: Insufficient documentation

## 2012-08-17 DIAGNOSIS — Z8639 Personal history of other endocrine, nutritional and metabolic disease: Secondary | ICD-10-CM | POA: Insufficient documentation

## 2012-08-17 DIAGNOSIS — Z79899 Other long term (current) drug therapy: Secondary | ICD-10-CM | POA: Insufficient documentation

## 2012-08-17 DIAGNOSIS — G8929 Other chronic pain: Secondary | ICD-10-CM | POA: Insufficient documentation

## 2012-08-17 DIAGNOSIS — J449 Chronic obstructive pulmonary disease, unspecified: Secondary | ICD-10-CM | POA: Insufficient documentation

## 2012-08-17 DIAGNOSIS — Z862 Personal history of diseases of the blood and blood-forming organs and certain disorders involving the immune mechanism: Secondary | ICD-10-CM | POA: Insufficient documentation

## 2012-08-17 DIAGNOSIS — E871 Hypo-osmolality and hyponatremia: Secondary | ICD-10-CM | POA: Insufficient documentation

## 2012-08-17 DIAGNOSIS — R7402 Elevation of levels of lactic acid dehydrogenase (LDH): Secondary | ICD-10-CM | POA: Insufficient documentation

## 2012-08-17 DIAGNOSIS — R7401 Elevation of levels of liver transaminase levels: Secondary | ICD-10-CM | POA: Insufficient documentation

## 2012-08-17 DIAGNOSIS — F17200 Nicotine dependence, unspecified, uncomplicated: Secondary | ICD-10-CM | POA: Insufficient documentation

## 2012-08-17 DIAGNOSIS — R071 Chest pain on breathing: Secondary | ICD-10-CM | POA: Insufficient documentation

## 2012-08-17 DIAGNOSIS — Z923 Personal history of irradiation: Secondary | ICD-10-CM | POA: Insufficient documentation

## 2012-08-17 DIAGNOSIS — N644 Mastodynia: Secondary | ICD-10-CM | POA: Insufficient documentation

## 2012-08-17 DIAGNOSIS — R0789 Other chest pain: Secondary | ICD-10-CM

## 2012-08-17 DIAGNOSIS — J4489 Other specified chronic obstructive pulmonary disease: Secondary | ICD-10-CM | POA: Insufficient documentation

## 2012-08-17 LAB — CBC WITH DIFFERENTIAL/PLATELET
Basophils Absolute: 0.1 10*3/uL (ref 0.0–0.1)
Basophils Relative: 2 % — ABNORMAL HIGH (ref 0–1)
Eosinophils Absolute: 0.2 10*3/uL (ref 0.0–0.7)
Eosinophils Relative: 3 % (ref 0–5)
HCT: 41.5 % (ref 36.0–46.0)
Hemoglobin: 14.2 g/dL (ref 12.0–15.0)
Lymphocytes Relative: 54 % — ABNORMAL HIGH (ref 12–46)
Lymphs Abs: 3.4 10*3/uL (ref 0.7–4.0)
MCH: 31.6 pg (ref 26.0–34.0)
MCHC: 34.2 g/dL (ref 30.0–36.0)
MCV: 92.2 fL (ref 78.0–100.0)
Monocytes Absolute: 0.8 10*3/uL (ref 0.1–1.0)
Monocytes Relative: 13 % — ABNORMAL HIGH (ref 3–12)
Neutro Abs: 1.8 10*3/uL (ref 1.7–7.7)
Neutrophils Relative %: 29 % — ABNORMAL LOW (ref 43–77)
Platelets: 236 10*3/uL (ref 150–400)
RBC: 4.5 MIL/uL (ref 3.87–5.11)
RDW: 13.8 % (ref 11.5–15.5)
WBC: 6.2 10*3/uL (ref 4.0–10.5)

## 2012-08-17 LAB — COMPREHENSIVE METABOLIC PANEL
ALT: 53 U/L — ABNORMAL HIGH (ref 0–35)
AST: 35 U/L (ref 0–37)
Albumin: 4.3 g/dL (ref 3.5–5.2)
Alkaline Phosphatase: 95 U/L (ref 39–117)
BUN: 13 mg/dL (ref 6–23)
CO2: 24 mEq/L (ref 19–32)
Calcium: 10 mg/dL (ref 8.4–10.5)
Chloride: 99 mEq/L (ref 96–112)
Creatinine, Ser: 0.77 mg/dL (ref 0.50–1.10)
GFR calc Af Amer: >90 mL/min (ref >90)
GFR calc non Af Amer: >90 mL/min (ref >90)
Glucose, Bld: 96 mg/dL (ref 70–99)
Potassium: 3.4 mEq/L — ABNORMAL LOW (ref 3.5–5.1)
Sodium: 134 mEq/L — ABNORMAL LOW (ref 135–145)
Total Bilirubin: 0.3 mg/dL (ref 0.3–1.2)
Total Protein: 8.5 g/dL — ABNORMAL HIGH (ref 6.0–8.3)

## 2012-08-17 LAB — POCT I-STAT TROPONIN I: Troponin i, poc: 0.03 ng/mL (ref 0.00–0.08)

## 2012-08-17 MED ORDER — LISINOPRIL-HYDROCHLOROTHIAZIDE 20-12.5 MG PO TABS: 1.0000 | ORAL_TABLET | Freq: Every day | ORAL | Status: DC

## 2012-08-17 MED ORDER — ASPIRIN 81 MG PO CHEW
324.0000 mg | CHEWABLE_TABLET | Freq: Once | ORAL | Status: AC
  Administered 2012-08-17: 324 mg via ORAL
  Filled 2012-08-17: qty 4

## 2012-08-17 MED ORDER — KETOROLAC TROMETHAMINE 30 MG/ML IJ SOLN
30.0000 mg | Freq: Once | INTRAMUSCULAR | Status: AC
  Administered 2012-08-17: 30 mg via INTRAVENOUS
  Filled 2012-08-17: qty 1

## 2012-08-17 NOTE — ED Provider Notes (Signed)
History     CSN: [id number]  Arrival date & time 08/17/12  0023   First MD Initiated Contact with Patient 08/17/12 0102      Chief Complaint  Patient presents with  . Chest Pain    (Consider location/radiation/quality/duration/timing/severity/associated sxs/prior treatment) Patient is a 57 y.o. female presenting with chest pain. The history is provided by the patient.  Chest Pain She is a very poor historian, but she has been having pain in the left side of her chest with radiation to both arms for about the last week. She describes it as like a knot that spreads out and she rates the pain at 6/10. Nothing makes it better nothing makes it worse. Specifically, the pain is not worse with exertion or deep breath. There is no associated dyspnea but she has had mild nausea and sweating. She is status post cancer of the left breast with surgery and radiation and she has chronic tenderness of the breast and she is back to his the pain she is having his different from the pain that she always has in that breast. She does have significant cardiac risk factors in that she smokes one pack of cigarettes a day, has hypertension and hyperlipidemia. She ran out of her blood pressure medicine about 2 weeks ago. She has been taking ibuprofen and hydrocodone-acetaminophen with partial and temporary relief.  Past Medical History  Diagnosis Date  . Cancer   . Hypertension   . COPD (chronic obstructive pulmonary disease)   . Hyperlipemia     Past Surgical History  Procedure Laterality Date  . Tubal ligation    . Appendectomy    . Breast surgery      No family history on file.  History  Substance Use Topics  . Smoking status: Current Every Day Smoker -- 1.00 packs/day  . Smokeless tobacco: Not on file  . Alcohol Use: No    OB History   Grav Para Term Preterm Abortions TAB SAB Ect Mult Living                  Review of Systems  Cardiovascular: Positive for chest pain.  All other systems  reviewed and are negative.    Allergies  Review of patient's allergies indicates no known allergies.  Home Medications   Current Outpatient Rx  Name  Route  Sig  Dispense  Refill  . HYDROcodone-acetaminophen (NORCO/VICODIN) 5-325 MG per tablet   Oral   Take 1 tablet by mouth every 6 (six) hours as needed for pain.   15 tablet   0   . ibuprofen (ADVIL,MOTRIN) 600 MG tablet   Oral   Take 600 mg by mouth every 6 (six) hours as needed for pain.         [name] ibuprofen (ADVIL,MOTRIN) 600 MG tablet   Oral   Take 1 tablet (600 mg total) by mouth every 6 (six) hours as needed for pain.   30 tablet   0   . lisinopril-hydrochlorothiazide (PRINZIDE,ZESTORETIC) 20-12.5 MG per tablet   Oral   Take 1 tablet by mouth daily.   30 tablet   2   . methocarbamol (ROBAXIN) 500 MG tablet   Oral   Take 1 tablet (500 mg total) by mouth 2 (two) times daily.   20 tablet   0     BP 154/105  Pulse 62  Temp(Src) 97.9 F (36.6 C)  Resp 20  SpO2 99%  Physical Exam  Nursing note and vitals reviewed.  56  year old female, resting comfortably and in no acute distress. Vital signs are significant for hypertension with blood pressure 154/105. Oxygen saturation is 99%, which is normal. Head is normocephalic and atraumatic. PERRLA, EOMI. Oropharynx is clear. Neck is nontender and supple without adenopathy or JVD. Back is nontender and there is no CVA tenderness. Lungs are clear without rales, wheezes, or rhonchi. Chest: Left breast is hyperpigmented and tender consistent with post radiation changes. There is no R. edema or warmth. Heart has regular rate and rhythm without murmur. Abdomen is soft, flat, nontender without masses or hepatosplenomegaly and peristalsis is normoactive. Extremities have no cyanosis or edema, full range of motion is present. Skin is warm and dry without rash. Neurologic: Mental status is normal, cranial nerves are intact, there are no motor or sensory deficits.  ED  Course  Procedures (including critical care time)  Labs Reviewed  COMPREHENSIVE METABOLIC PANEL  CBC WITH DIFFERENTIAL   No results found.   Date: 08/17/2012  Rate: 69  Rhythm: normal sinus rhythm  QRS Axis: normal  Intervals: normal  ST/T Wave abnormalities: normal  Conduction Disutrbances:none  Narrative Interpretation: Left atrial hypertrophy. When compared with ECG of 10/02/2011, no significant changes are seen.  Old EKG Reviewed: unchanged    1. Chest wall pain   2. Hyponatremia   3. Elevated transaminase level       MDM  Chest pain which seems most likely related to the post radiation changes in her breast. However, she does have significant cardiac risk factors. [facility], but no longer has a PCP. Screening labs have been obtained and she will be given aspirin and ketorolac.   She got good relief of pain from ketorolac. She's a vice use over-the-counter ibuprofen as needed for pain. She states that she cannot take naproxen because of stomach upset.      Dione Booze, MD 08/17/12 [phone]

## 2012-08-17 NOTE — ED Notes (Signed)
Pt c/o left chest pain radiating down left arm; states has left breast cancer and unable to differentiate; not taking bp pills; states has chest pain but today started with arm pain

## 2012-10-08 ENCOUNTER — Encounter: Payer: Self-pay | Admitting: *Deleted

## 2012-10-08 NOTE — Progress Notes (Signed)
Letter mailed to request pt to call and schedule appt with new med onc provider. 

## 2012-11-28 ENCOUNTER — Encounter (HOSPITAL_COMMUNITY): Payer: Self-pay | Admitting: Emergency Medicine

## 2012-11-28 ENCOUNTER — Emergency Department (HOSPITAL_COMMUNITY)
Admission: EM | Admit: 2012-11-28 | Discharge: 2012-11-28 | Disposition: A | Discharge status: Home / Self Care | Payer: No Typology Code available for payment source | Attending: Emergency Medicine | Admitting: Emergency Medicine

## 2012-11-28 ENCOUNTER — Emergency Department (HOSPITAL_COMMUNITY): Payer: No Typology Code available for payment source

## 2012-11-28 DIAGNOSIS — Z79899 Other long term (current) drug therapy: Secondary | ICD-10-CM | POA: Insufficient documentation

## 2012-11-28 DIAGNOSIS — K6389 Other specified diseases of intestine: Secondary | ICD-10-CM

## 2012-11-28 DIAGNOSIS — F17200 Nicotine dependence, unspecified, uncomplicated: Secondary | ICD-10-CM | POA: Insufficient documentation

## 2012-11-28 DIAGNOSIS — Z8639 Personal history of other endocrine, nutritional and metabolic disease: Secondary | ICD-10-CM | POA: Insufficient documentation

## 2012-11-28 DIAGNOSIS — I10 Essential (primary) hypertension: Secondary | ICD-10-CM | POA: Insufficient documentation

## 2012-11-28 DIAGNOSIS — Z862 Personal history of diseases of the blood and blood-forming organs and certain disorders involving the immune mechanism: Secondary | ICD-10-CM | POA: Insufficient documentation

## 2012-11-28 DIAGNOSIS — D649 Anemia, unspecified: Secondary | ICD-10-CM | POA: Insufficient documentation

## 2012-11-28 DIAGNOSIS — Z859 Personal history of malignant neoplasm, unspecified: Secondary | ICD-10-CM | POA: Insufficient documentation

## 2012-11-28 DIAGNOSIS — K37 Unspecified appendicitis: Secondary | ICD-10-CM | POA: Insufficient documentation

## 2012-11-28 DIAGNOSIS — M549 Dorsalgia, unspecified: Secondary | ICD-10-CM | POA: Insufficient documentation

## 2012-11-28 DIAGNOSIS — J4489 Other specified chronic obstructive pulmonary disease: Secondary | ICD-10-CM | POA: Insufficient documentation

## 2012-11-28 DIAGNOSIS — Z9851 Tubal ligation status: Secondary | ICD-10-CM | POA: Insufficient documentation

## 2012-11-28 DIAGNOSIS — J449 Chronic obstructive pulmonary disease, unspecified: Secondary | ICD-10-CM | POA: Insufficient documentation

## 2012-11-28 DIAGNOSIS — Z9089 Acquired absence of other organs: Secondary | ICD-10-CM | POA: Insufficient documentation

## 2012-11-28 LAB — URINALYSIS, ROUTINE W REFLEX MICROSCOPIC
Bilirubin Urine: NEGATIVE
Glucose, UA: NEGATIVE mg/dL
Hgb urine dipstick: NEGATIVE
Ketones, ur: NEGATIVE mg/dL
Nitrite: NEGATIVE
Protein, ur: NEGATIVE mg/dL
Specific Gravity, Urine: 1.018 (ref 1.005–1.030)
Urobilinogen, UA: 0.2 mg/dL (ref 0.0–1.0)
pH: 5.5 (ref 5.0–8.0)

## 2012-11-28 LAB — COMPREHENSIVE METABOLIC PANEL
ALT: 14 U/L (ref 0–35)
AST: 17 U/L (ref 0–37)
Albumin: 3.5 g/dL (ref 3.5–5.2)
Alkaline Phosphatase: 64 U/L (ref 39–117)
BUN: 12 mg/dL (ref 6–23)
CO2: 27 mEq/L (ref 19–32)
Calcium: 9.1 mg/dL (ref 8.4–10.5)
Chloride: 102 mEq/L (ref 96–112)
Creatinine, Ser: 0.76 mg/dL (ref 0.50–1.10)
GFR calc Af Amer: >90 mL/min (ref >90)
GFR calc non Af Amer: >90 mL/min (ref >90)
Glucose, Bld: 80 mg/dL (ref 70–99)
Potassium: 3.5 mEq/L (ref 3.5–5.1)
Sodium: 139 mEq/L (ref 135–145)
Total Bilirubin: 0.2 mg/dL — ABNORMAL LOW (ref 0.3–1.2)
Total Protein: 7 g/dL (ref 6.0–8.3)

## 2012-11-28 LAB — CBC WITH DIFFERENTIAL/PLATELET
Basophils Absolute: 0.1 10*3/uL (ref 0.0–0.1)
Basophils Relative: 1 % (ref 0–1)
Eosinophils Absolute: 0.2 10*3/uL (ref 0.0–0.7)
Eosinophils Relative: 3 % (ref 0–5)
HCT: 26.8 % — ABNORMAL LOW (ref 36.0–46.0)
Hemoglobin: 9.2 g/dL — ABNORMAL LOW (ref 12.0–15.0)
Lymphocytes Relative: 43 % (ref 12–46)
Lymphs Abs: 2.6 10*3/uL (ref 0.7–4.0)
MCH: 32.3 pg (ref 26.0–34.0)
MCHC: 34.3 g/dL (ref 30.0–36.0)
MCV: 94 fL (ref 78.0–100.0)
Monocytes Absolute: 0.7 10*3/uL (ref 0.1–1.0)
Monocytes Relative: 11 % (ref 3–12)
Neutro Abs: 2.5 10*3/uL (ref 1.7–7.7)
Neutrophils Relative %: 42 % — ABNORMAL LOW (ref 43–77)
Platelets: 269 10*3/uL (ref 150–400)
RBC: 2.85 MIL/uL — ABNORMAL LOW (ref 3.87–5.11)
RDW: 13.5 % (ref 11.5–15.5)
WBC: 5.9 10*3/uL (ref 4.0–10.5)

## 2012-11-28 LAB — URINE MICROSCOPIC-ADD ON

## 2012-11-28 LAB — OCCULT BLOOD, POC DEVICE: Fecal Occult Bld: NEGATIVE

## 2012-11-28 MED ORDER — OXYCODONE-ACETAMINOPHEN 5-325 MG PO TABS: 1.0000 | ORAL_TABLET | ORAL | Status: DC | PRN

## 2012-11-28 MED ORDER — ONDANSETRON HCL 4 MG/2ML IJ SOLN
4.0000 mg | Freq: Once | INTRAMUSCULAR | Status: AC
  Administered 2012-11-28: 4 mg via INTRAVENOUS
  Filled 2012-11-28: qty 2

## 2012-11-28 MED ORDER — TRAMADOL HCL 50 MG PO TABS: 50.0000 mg | ORAL_TABLET | Freq: Four times a day (QID) | ORAL | Status: DC | PRN

## 2012-11-28 MED ORDER — SODIUM CHLORIDE 0.9 % IV BOLUS (SEPSIS)
1000.0000 mL | Freq: Once | INTRAVENOUS | Status: AC
  Administered 2012-11-28: 1000 mL via INTRAVENOUS

## 2012-11-28 MED ORDER — MORPHINE SULFATE 4 MG/ML IJ SOLN
4.0000 mg | Freq: Once | INTRAMUSCULAR | Status: AC
  Administered 2012-11-28: 4 mg via INTRAVENOUS
  Filled 2012-11-28: qty 1

## 2012-11-28 MED ORDER — LISINOPRIL-HYDROCHLOROTHIAZIDE 20-12.5 MG PO TABS: 1.0000 | ORAL_TABLET | Freq: Every day | ORAL | Status: DC

## 2012-11-28 NOTE — ED Notes (Signed)
Pt is awake and alert. C/o right side flank pian for the past 3 days. Pt reports bloating and swelling. Denies injury. Pt reports take 800 mg of ibuprofen Q4 hours to relive the pain. Pt denies N/V.

## 2012-11-28 NOTE — ED Notes (Signed)
Pt is awake and alert, pleasant and cooperative. Patient denies pain,nausea or vomiting. Discharge vitals 132/78 HR 78 RR 16 and unlabored. Pt advised to follow-up with PCP. Will continue to monitor for safety. Patient escorted to lobby without incident. T.Melvyn Neth RN

## 2012-11-28 NOTE — ED Provider Notes (Signed)
CSN: [id number]     Arrival date & time 11/28/12  1109 History   First MD Initiated Contact with Patient 11/28/12 1251     Chief Complaint  Patient presents with  . Abdominal Pain   (Consider location/radiation/quality/duration/timing/severity/associated sxs/prior Treatment) HPI Comments: 57 year old female with right back pain that radiates to her abdomen over the past 3 days. She dates it is like a throbbing pain. The pain does not wax and wane has been slowly getting worse. Has tried Tylenol and Naprosyn but has had no improvement in the pain. She denies any fevers or chills. Denies any dysuria but has had increased urination. She denies any nausea or vomiting. Denies any hematuria or vaginal discharge or bleeding. States her abdomen does not hurt but feels swollen. Denies any midline back pain or leg weakness or numbness.  The history is provided by the patient.    Past Medical History  Diagnosis Date  . Cancer   . Hypertension   . COPD (chronic obstructive pulmonary disease)   . Hyperlipemia    Past Surgical History  Procedure Laterality Date  . Tubal ligation    . Appendectomy    . Breast surgery     History reviewed. No pertinent family history. History  Substance Use Topics  . Smoking status: Current Every Day Smoker -- 1.00 packs/day for 20 years    Types: Cigarettes  . Smokeless tobacco: Not on file  . Alcohol Use: No   OB History   Grav Para Term Preterm Abortions TAB SAB Ect Mult Living                 Review of Systems  Constitutional: Negative for fever and chills.  Gastrointestinal: Positive for abdominal pain (and bloating). Negative for nausea and vomiting.  Genitourinary: Positive for frequency and flank pain. Negative for dysuria.  Musculoskeletal: Positive for back pain.  Neurological: Negative for weakness and numbness.  All other systems reviewed and are negative.    Allergies  Vicodin  Home Medications   Current Outpatient Rx  Name  Route   Sig  Dispense  Refill  . albuterol (PROVENTIL HFA;VENTOLIN HFA) 108 (90 BASE) MCG/ACT inhaler   Inhalation   Inhale 2 puffs into the lungs every 6 (six) hours as needed for wheezing.         [name] ibuprofen (ADVIL,MOTRIN) 200 MG tablet   Oral   Take 800 mg by mouth every 8 (eight) hours as needed for pain.         [name] lisinopril-hydrochlorothiazide (ZESTORETIC) 20-12.5 MG per tablet   Oral   Take 1 tablet by mouth daily.   30 tablet   0   . loratadine (CLARITIN) 10 MG tablet   Oral   Take 10 mg by mouth daily.         . naproxen (NAPROSYN) 500 MG tablet   Oral   Take 250 mg by mouth 2 (two) times daily as needed (for pain).          BP 103/59  Pulse 56  Temp(Src) 97.9 F (36.6 C) (Oral)  Resp 18  SpO2 99% Physical Exam  Nursing note and vitals reviewed. Constitutional: She is oriented to person, place, and time. She appears well-developed and well-nourished. No distress.  HENT:  Head: Normocephalic and atraumatic.  Right Ear: External ear normal.  Left Ear: External ear normal.  Nose: Nose normal.  Eyes: Right eye exhibits no discharge. Left eye exhibits no discharge.  Cardiovascular: Normal rate, regular rhythm and  normal heart sounds.   Pulmonary/Chest: Effort normal and breath sounds normal.  Abdominal: Soft. There is no tenderness.  Mild right flank tenderness  Genitourinary: Rectum normal. Rectal exam shows no external hemorrhoid, no internal hemorrhoid, no mass, no tenderness and anal tone normal. Guaiac negative stool.  Neurological: She is alert and oriented to person, place, and time. She has normal strength. No sensory deficit.  Skin: Skin is warm and dry.    ED Course  Procedures (including critical care time) Labs Review Labs Reviewed  URINALYSIS, ROUTINE W REFLEX MICROSCOPIC - Abnormal; Notable for the following:    Leukocytes, UA SMALL (*)    All other components within normal limits  URINE MICROSCOPIC-ADD ON - Abnormal; Notable for the  following:    Squamous Epithelial / LPF FEW (*)    All other components within normal limits  CBC WITH DIFFERENTIAL - Abnormal; Notable for the following:    RBC 2.85 (*)    Hemoglobin 9.2 (*)    HCT 26.8 (*)    Neutrophils Relative % 42 (*)    All other components within normal limits  COMPREHENSIVE METABOLIC PANEL - Abnormal; Notable for the following:    Total Bilirubin 0.2 (*)    All other components within normal limits  OCCULT BLOOD, POC DEVICE   Imaging Review Ct Abdomen Pelvis Wo Contrast  11/28/2012   *RADIOLOGY REPORT*  Clinical Data: Right flank pain  CT ABDOMEN AND PELVIS WITHOUT CONTRAST  Technique:  Multidetector CT imaging of the abdomen and pelvis was performed following the standard protocol without intravenous contrast.  Comparison: Prior acute abdominal series radiograph 08/24/2006  Findings:  Lower Chest:  Mild atelectasis versus pleural parenchymal scarring in the anterior lingula and right lower lobe.  Visualized cardiac structures within normal limits for size.  Unremarkable visualized distal thoracic esophagus.  Abdomen: Unenhanced CT was performed per clinician order.  Lack of IV contrast limits sensitivity and specificity, especially for evaluation of abdominal/pelvic solid viscera.  Within these limitations, unremarkable CT appearance of the stomach, duodenum, spleen, adrenal glands and pancreas.  Normal hepatic contour and morphology.  No focal hepatic lesion. Gallbladder is unremarkable. No intra or extrahepatic biliary ductal dilatation.  No hydronephrosis or nephrolithiasis.  No ureteral dilatation.  Small 1.7 cm rounded fat attenuation structure with a thin peripheral soft tissue rim noted in the right cul-de-sac adjacent to the rectosigmoid junction with mild surrounding inflammatory change.  The imaging features are most suggestive of subacute epiploic appendagitis.  Normal-caliber bowel.  No evidence of obstruction.  No significant colonic diverticular disease.   Small appendiceal stump noted and unremarkable.  The terminal ileum is unremarkable.  No free fluid or suspicious adenopathy.  Pelvis: The bladder is decompressed.  No distal ureteral stone. Globular, mildly enlarged uterus, likely multi fibroid. Unremarkable adnexa.  No free fluid or adenopathy.  Bones: No acute fracture or aggressive appearing lytic or blastic osseous lesion.  Vascular: Limited evaluation in the absence of intravenous contrast material.  Extensive calcified atherosclerotic plaque throughout the aorta and the proximal branch vessels.  IMPRESSION:  1.  CT findings are most consistent with a subacute epiploic appendagitis in the region of the rectosigmoid junction in the right pelvic cul-de-sac. 2.  Negative for hydronephrosis or nephrolithiasis. 3.  Extensive atherosclerotic vascular calcifications throughout the aorto iliac system and branch vessels.  Limited evaluation in the absence of intravenous contrast material. 4.  Additional ancillary findings as above.   Original Report Authenticated By: Malachy Moan, M.D.  MDM   1. Epiploic appendagitis   2. Anemia    Patient with mild right flank/lower abd pain. CT shows no stone, but does show subacute epiploic appendagitis. Pain controlled in ED. Noted to be anemic in her labs, though she is asymptomatic. Denies any GI bleeding sx or vomiting. Rectal shows no gross or microscopic blood. As she is asymptomatic, I will refer her to GI for outpatient w/u.    Audree Camel, MD 11/28/12 1630

## 2013-01-26 ENCOUNTER — Other Ambulatory Visit (HOSPITAL_COMMUNITY): Payer: Self-pay

## 2013-01-26 ENCOUNTER — Other Ambulatory Visit (HOSPITAL_COMMUNITY): Payer: Self-pay | Admitting: Psychiatry

## 2013-01-26 DIAGNOSIS — M79604 Pain in right leg: Secondary | ICD-10-CM

## 2013-01-31 ENCOUNTER — Encounter (HOSPITAL_COMMUNITY): Payer: No Typology Code available for payment source

## 2013-02-02 ENCOUNTER — Ambulatory Visit (HOSPITAL_COMMUNITY)
Admission: RE | Admit: 2013-02-02 | Discharge: 2013-02-02 | Disposition: A | Discharge status: Home / Self Care | Payer: No Typology Code available for payment source | Source: Ambulatory Visit | Attending: Family Medicine | Admitting: Family Medicine

## 2013-02-02 DIAGNOSIS — M79604 Pain in right leg: Secondary | ICD-10-CM

## 2013-02-02 DIAGNOSIS — I739 Peripheral vascular disease, unspecified: Secondary | ICD-10-CM | POA: Insufficient documentation

## 2013-02-02 DIAGNOSIS — M79609 Pain in unspecified limb: Secondary | ICD-10-CM

## 2013-02-02 NOTE — Progress Notes (Signed)
*  PRELIMINARY RESULTS* Vascular Ultrasound Lower extremity venous duplex = no evidence of DVT. Lower extremity arterial duplex = Bilateral monophasic flow noted throughout both lower extremities. Consistent with Aortoiliac disease, right worse than left.   Called report to Dr. Christell Constant.   Farrel Demark, RDMS, RVT  02/02/2013, 10:49 AM

## 2013-02-23 ENCOUNTER — Ambulatory Visit: Payer: No Typology Code available for payment source | Attending: Internal Medicine | Admitting: Internal Medicine

## 2013-02-23 ENCOUNTER — Ambulatory Visit: Payer: No Typology Code available for payment source | Attending: Internal Medicine

## 2013-02-23 ENCOUNTER — Encounter: Payer: Self-pay | Admitting: Internal Medicine

## 2013-02-23 VITALS — BP 132/86 | HR 74 | Temp 98.0°F | Resp 16 | Wt 150.0 lb

## 2013-02-23 DIAGNOSIS — I739 Peripheral vascular disease, unspecified: Secondary | ICD-10-CM | POA: Insufficient documentation

## 2013-02-23 DIAGNOSIS — J4489 Other specified chronic obstructive pulmonary disease: Secondary | ICD-10-CM | POA: Insufficient documentation

## 2013-02-23 DIAGNOSIS — M79604 Pain in right leg: Secondary | ICD-10-CM

## 2013-02-23 DIAGNOSIS — Z1211 Encounter for screening for malignant neoplasm of colon: Secondary | ICD-10-CM

## 2013-02-23 DIAGNOSIS — IMO0001 Reserved for inherently not codable concepts without codable children: Secondary | ICD-10-CM | POA: Insufficient documentation

## 2013-02-23 DIAGNOSIS — Z9889 Other specified postprocedural states: Secondary | ICD-10-CM | POA: Insufficient documentation

## 2013-02-23 DIAGNOSIS — Z139 Encounter for screening, unspecified: Secondary | ICD-10-CM

## 2013-02-23 DIAGNOSIS — J449 Chronic obstructive pulmonary disease, unspecified: Secondary | ICD-10-CM | POA: Insufficient documentation

## 2013-02-23 DIAGNOSIS — I10 Essential (primary) hypertension: Secondary | ICD-10-CM | POA: Insufficient documentation

## 2013-02-23 DIAGNOSIS — K219 Gastro-esophageal reflux disease without esophagitis: Secondary | ICD-10-CM | POA: Insufficient documentation

## 2013-02-23 DIAGNOSIS — F17200 Nicotine dependence, unspecified, uncomplicated: Secondary | ICD-10-CM

## 2013-02-23 DIAGNOSIS — M79609 Pain in unspecified limb: Secondary | ICD-10-CM

## 2013-02-23 LAB — COMPLETE METABOLIC PANEL WITHOUT GFR
ALT: 20 U/L (ref 0–35)
AST: 21 U/L (ref 0–37)
Albumin: 4.2 g/dL (ref 3.5–5.2)
Alkaline Phosphatase: 71 U/L (ref 39–117)
BUN: 16 mg/dL (ref 6–23)
CO2: 30 meq/L (ref 19–32)
Calcium: 9.5 mg/dL (ref 8.4–10.5)
Chloride: 100 meq/L (ref 96–112)
Creat: 0.79 mg/dL (ref 0.50–1.10)
GFR, Est African American: >89 mL/min
GFR, Est Non African American: 84 mL/min
Glucose, Bld: 83 mg/dL (ref 70–99)
Potassium: 3.5 meq/L (ref 3.5–5.3)
Sodium: 140 meq/L (ref 135–145)
Total Bilirubin: 0.5 mg/dL (ref 0.3–1.2)
Total Protein: 7.7 g/dL (ref 6.0–8.3)

## 2013-02-23 LAB — LIPID PANEL
Cholesterol: 220 mg/dL — ABNORMAL HIGH (ref 0–200)
HDL: 60 mg/dL (ref >39)
LDL Cholesterol: 143 mg/dL — ABNORMAL HIGH (ref 0–99)
Total CHOL/HDL Ratio: 3.7 Ratio
Triglycerides: 85 mg/dL (ref <150)
VLDL: 17 mg/dL (ref 0–40)

## 2013-02-23 LAB — CBC WITH DIFFERENTIAL/PLATELET
Basophils Absolute: 0 K/uL (ref 0.0–0.1)
Basophils Relative: 1 % (ref 0–1)
Eosinophils Absolute: 0.1 K/uL (ref 0.0–0.7)
Eosinophils Relative: 1 % (ref 0–5)
HCT: 37.9 % (ref 36.0–46.0)
Hemoglobin: 13.1 g/dL (ref 12.0–15.0)
Lymphocytes Relative: 36 % (ref 12–46)
Lymphs Abs: 2 K/uL (ref 0.7–4.0)
MCH: 31.6 pg (ref 26.0–34.0)
MCHC: 34.6 g/dL (ref 30.0–36.0)
MCV: 91.5 fL (ref 78.0–100.0)
Monocytes Absolute: 0.6 K/uL (ref 0.1–1.0)
Monocytes Relative: 11 % (ref 3–12)
Neutro Abs: 2.9 K/uL (ref 1.7–7.7)
Neutrophils Relative %: 51 % (ref 43–77)
Platelets: 261 K/uL (ref 150–400)
RBC: 4.14 MIL/uL (ref 3.87–5.11)
RDW: 14.1 % (ref 11.5–15.5)
WBC: 5.7 K/uL (ref 4.0–10.5)

## 2013-02-23 LAB — TSH: TSH: 0.971 u[IU]/mL (ref 0.350–4.500)

## 2013-02-23 MED ORDER — NICOTINE 21 MG/24HR TD PT24: 21.0000 mg | MEDICATED_PATCH | Freq: Every day | TRANSDERMAL | Status: DC

## 2013-02-23 NOTE — Progress Notes (Signed)
MRN: [id number] Name: Brandy Stevenson  Sex: female Age: 57 y.o. DOB: 01/27/1956  Allergies: Vicodin  Chief Complaint  Patient presents with  . Establish Care    HPI: Patient is 57 y.o. female who comes for the first time to establish medical care, she has history of hypertension COPD GERD, history of breast cancer status post lumpectomy and radiotherapy following up with oncologist, she also reported to have intermittent claudication and leg pain for the last 8 months, she had a lower extremity vascular ultrasound done which reported no DVT butARTERIAL DUPLEX=Bilateral monophasic flow in lowerextremity arterial system. Consistent with aortoiliacdisease.patient does smoke cigarettes everyday, have counseled her about quit smoking, she's agreeable to try nicotine patch. Patient has not seen any vascular surgeon. Patient denies any fever chills chest pain shortness of breath.    Past Medical History  Diagnosis Date  . Cancer   . Hypertension   . COPD (chronic obstructive pulmonary disease)   . Hyperlipemia     Past Surgical History  Procedure Laterality Date  . Tubal ligation    . Appendectomy    . Breast surgery    . Breast lumpectomy Left     s/p radiation therapy       Medication List       This list is accurate as of: 02/23/13 12:56 PM.  Always use your most recent med list.               albuterol 108 (90 BASE) MCG/ACT inhaler  Commonly known as:  PROVENTIL HFA;VENTOLIN HFA  Inhale 2 puffs into the lungs every 6 (six) hours as needed for wheezing.     Fluticasone-Salmeterol 100-50 MCG/DOSE Aepb  Commonly known as:  ADVAIR  Inhale 1 puff into the lungs 2 (two) times daily.     ibuprofen 200 MG tablet  Commonly known as:  ADVIL,MOTRIN  Take 800 mg by mouth every 8 (eight) hours as needed for pain.     lisinopril-hydrochlorothiazide 20-12.5 MG per tablet  Commonly known as:  ZESTORETIC  Take 1 tablet by mouth daily.     loratadine 10 MG tablet  Commonly known  as:  CLARITIN  Take 10 mg by mouth daily.     naproxen 500 MG tablet  Commonly known as:  NAPROSYN  Take 250 mg by mouth 2 (two) times daily as needed (for pain).     nicotine 21 mg/24hr patch  Commonly known as:  NICODERM CQ  Place 1 patch (21 mg total) onto the skin daily.     ranitidine 150 MG capsule  Commonly known as:  ZANTAC  Take 150 mg by mouth 2 (two) times daily.     traMADol 50 MG tablet  Commonly known as:  ULTRAM  Take 1 tablet (50 mg total) by mouth every 6 (six) hours as needed for pain.        Meds ordered this encounter  Medications  . ranitidine (ZANTAC) 150 MG capsule    Sig: Take 150 mg by mouth 2 (two) times daily.  . Fluticasone-Salmeterol (ADVAIR) 100-50 MCG/DOSE AEPB    Sig: Inhale 1 puff into the lungs 2 (two) times daily.  . nicotine (NICODERM CQ) 21 mg/24hr patch    Sig: Place 1 patch (21 mg total) onto the skin daily.    Dispense:  28 patch    Refill:  0     There is no immunization history on file for this patient.  History reviewed. No pertinent family history.  History  Substance Use  Topics  . Smoking status: Current Every Day Smoker -- 1.00 packs/day for 40 years    Types: Cigarettes  . Smokeless tobacco: Not on file  . Alcohol Use: No    Review of Systems  As noted in HPI  Filed Vitals:   02/23/13 1216  BP: 132/86  Pulse: 74  Temp: 98 F (36.7 C)  Resp: 16    Physical Exam  Physical Exam  Constitutional: No distress.  Eyes: EOM are normal. Pupils are equal, round, and reactive to light.  Neck: Neck supple.  Cardiovascular: Normal rate.   Pulmonary/Chest: Breath sounds normal. No respiratory distress. She has no wheezes. She has no rales.  Musculoskeletal: She exhibits no edema.    CBC    Component Value Date/Time   WBC 5.9 11/28/2012 1346   WBC 4.2 06/12/2008 0939   RBC 2.85* 11/28/2012 1346   RBC 4.46 06/12/2008 0939   HGB 9.2* 11/28/2012 1346   HGB 13.8 06/12/2008 0939   HCT 26.8* 11/28/2012 1346   HCT 40.2  06/12/2008 0939   PLT 269 11/28/2012 1346   PLT 230 06/12/2008 0939   MCV 94.0 11/28/2012 1346   MCV 90.1 06/12/2008 0939   LYMPHSABS 2.6 11/28/2012 1346   LYMPHSABS 1.5 06/12/2008 0939   MONOABS 0.7 11/28/2012 1346   MONOABS 0.6 06/12/2008 0939   EOSABS 0.2 11/28/2012 1346   EOSABS 0.1 06/12/2008 0939   BASOSABS 0.1 11/28/2012 1346   BASOSABS 0.0 06/12/2008 0939    CMP     Component Value Date/Time   NA 139 11/28/2012 1346   K 3.5 11/28/2012 1346   CL 102 11/28/2012 1346   CO2 27 11/28/2012 1346   GLUCOSE 80 11/28/2012 1346   BUN 12 11/28/2012 1346   CREATININE 0.76 11/28/2012 1346   CALCIUM 9.1 11/28/2012 1346   PROT 7.0 11/28/2012 1346   ALBUMIN 3.5 11/28/2012 1346   AST 17 11/28/2012 1346   ALT 14 11/28/2012 1346   ALKPHOS 64 11/28/2012 1346   BILITOT 0.2* 11/28/2012 1346   GFRNONAA >90 11/28/2012 1346   GFRAA >90 11/28/2012 1346    Lab Results  Component Value Date/Time   CHOL 222* 10/13/2007  8:19 PM    No components found with this basename: hga1c    Lab Results  Component Value Date/Time   AST 17 11/28/2012  1:46 PM    Assessment and Plan  Essential hypertension, benign Controlled continue with current medication also advised for low-salt diet  GERD (gastroesophageal reflux disease)  Symptoms stable continue with Zantac  COPD (chronic obstructive pulmonary disease) Patient is on Advair and albuterol when necessary  Leg pain, bilateral/Intermittent claudication - Plan: Ambulatory referral to Vascular Surgery  Smoking - Plan: nicotine (NICODERM CQ) 21 mg/24hr patch  History of lumpectomy ... history of breast cancer following up with oncology.  Special screening for malignant neoplasms, colon - Plan: Ambulatory referral to Gastroenterology  Screening - Plan: CBC with Differential, COMPLETE METABOLIC PANEL WITH GFR, TSH, Lipid panel, Vit D  25 hydroxy (rtn osteoporosis monitoring)   Health Maintenance -Colonoscopy: referred to GI    Return in about 6 weeks (around  04/06/2013).  Doris Cheadle, MD

## 2013-02-23 NOTE — Progress Notes (Signed)
Patient here to establish care Has history of left breast cancer (no blood pressure or blood draw from left arm) Has pain to her legs-doppler did not reveal DVT Was suggestive she may have an artery problem Has been vomiting whitish phlegm in the am  Will need referral to GI

## 2013-02-24 LAB — VITAMIN D 25 HYDROXY (VIT D DEFICIENCY, FRACTURES): Vit D, 25-Hydroxy: 17 ng/mL — ABNORMAL LOW (ref 30–89)

## 2013-02-25 ENCOUNTER — Other Ambulatory Visit: Payer: Self-pay | Admitting: *Deleted

## 2013-02-25 DIAGNOSIS — M79609 Pain in unspecified limb: Secondary | ICD-10-CM

## 2013-02-25 DIAGNOSIS — I739 Peripheral vascular disease, unspecified: Secondary | ICD-10-CM

## 2013-02-28 ENCOUNTER — Telehealth: Payer: Self-pay | Admitting: Emergency Medicine

## 2013-02-28 MED ORDER — VITAMIN D (ERGOCALCIFEROL) 1.25 MG (50000 UNIT) PO CAPS: 50000.0000 [IU] | ORAL_CAPSULE | ORAL | Status: DC

## 2013-02-28 NOTE — Addendum Note (Signed)
Addended by: Nonnie Done D on: 02/28/2013 11:21 AM   Modules accepted: Orders

## 2013-02-28 NOTE — Telephone Encounter (Signed)
Message copied by Darlis Loan on Mon Feb 28, 2013 11:21 AM ------      Message from: Doris Cheadle      Created: Mon Feb 28, 2013 10:36 AM       Blood work reviewed, noticed low vitamin D, call patient advise to start ergocalciferol 50,000 units once a week for the duration of  12 weeks.      Also  noticed elevated cholesterol, advise patient for low fat diet.       ------

## 2013-02-28 NOTE — Telephone Encounter (Signed)
Attempted to reach pt with results but number listed unable to take messages

## 2013-03-02 ENCOUNTER — Telehealth: Payer: Self-pay | Admitting: Emergency Medicine

## 2013-03-02 ENCOUNTER — Other Ambulatory Visit: Payer: Self-pay | Admitting: Emergency Medicine

## 2013-03-02 MED ORDER — LISINOPRIL-HYDROCHLOROTHIAZIDE 20-12.5 MG PO TABS: 1.0000 | ORAL_TABLET | Freq: Every day | ORAL | Status: DC

## 2013-03-02 NOTE — Telephone Encounter (Signed)
Pt called requesting Lisinopril refill Pt also informed of Vitamin D script sent to our pharmacy She will pick up Monday

## 2013-03-07 ENCOUNTER — Other Ambulatory Visit: Payer: Self-pay | Admitting: Vascular Surgery

## 2013-03-07 DIAGNOSIS — I739 Peripheral vascular disease, unspecified: Secondary | ICD-10-CM

## 2013-03-07 DIAGNOSIS — M79609 Pain in unspecified limb: Secondary | ICD-10-CM

## 2013-03-21 ENCOUNTER — Encounter: Payer: Self-pay | Admitting: Vascular Surgery

## 2013-03-22 ENCOUNTER — Encounter: Payer: Self-pay | Admitting: Vascular Surgery

## 2013-03-22 ENCOUNTER — Ambulatory Visit (HOSPITAL_COMMUNITY)
Admission: RE | Admit: 2013-03-22 | Discharge: 2013-03-22 | Disposition: A | Discharge status: Home / Self Care | Payer: No Typology Code available for payment source | Source: Ambulatory Visit | Attending: Vascular Surgery | Admitting: Vascular Surgery

## 2013-03-22 ENCOUNTER — Ambulatory Visit (INDEPENDENT_AMBULATORY_CARE_PROVIDER_SITE_OTHER): Payer: No Typology Code available for payment source | Admitting: Vascular Surgery

## 2013-03-22 ENCOUNTER — Ambulatory Visit (INDEPENDENT_AMBULATORY_CARE_PROVIDER_SITE_OTHER)
Admission: RE | Admit: 2013-03-22 | Discharge: 2013-03-22 | Disposition: A | Discharge status: Home / Self Care | Payer: No Typology Code available for payment source | Source: Ambulatory Visit

## 2013-03-22 VITALS — BP 119/81 | HR 67 | Resp 16 | Ht 65.0 in | Wt 148.0 lb

## 2013-03-22 DIAGNOSIS — I739 Peripheral vascular disease, unspecified: Secondary | ICD-10-CM

## 2013-03-22 DIAGNOSIS — M79605 Pain in left leg: Principal | ICD-10-CM

## 2013-03-22 DIAGNOSIS — I70219 Atherosclerosis of native arteries of extremities with intermittent claudication, unspecified extremity: Secondary | ICD-10-CM

## 2013-03-22 DIAGNOSIS — M79609 Pain in unspecified limb: Secondary | ICD-10-CM

## 2013-03-22 DIAGNOSIS — I10 Essential (primary) hypertension: Secondary | ICD-10-CM | POA: Insufficient documentation

## 2013-03-22 DIAGNOSIS — I779 Disorder of arteries and arterioles, unspecified: Secondary | ICD-10-CM | POA: Insufficient documentation

## 2013-03-22 DIAGNOSIS — F17200 Nicotine dependence, unspecified, uncomplicated: Secondary | ICD-10-CM | POA: Insufficient documentation

## 2013-03-22 DIAGNOSIS — M79604 Pain in right leg: Secondary | ICD-10-CM

## 2013-03-22 DIAGNOSIS — E785 Hyperlipidemia, unspecified: Secondary | ICD-10-CM | POA: Insufficient documentation

## 2013-03-22 NOTE — Progress Notes (Signed)
Subjective:     Patient ID: Brandy Stevenson, female   DOB: 01-Jun-1955, 58 y.o.   MRN: [id number]  HPI this 58 year old female was evaluated for bilateral lower extremity leg pain. She denies pain at rest except at night when both legs are restless and have discomfort. She has no history of nonhealing ulcers or infection. She does have discomfort with ambulation in both legs beginning in the calves extending into the buttocks after walking about one half block. This is limiting her. She does have a long history of tobacco abuse smoking one pack per day 40+ years.  Past Medical History  Diagnosis Date  . Cancer   . Hypertension   . COPD (chronic obstructive pulmonary disease)   . Hyperlipemia     History  Substance Use Topics  . Smoking status: Current Every Day Smoker -- 1.00 packs/day for 40 years    Types: Cigarettes  . Smokeless tobacco: Not on file  . Alcohol Use: No    No family history on file.  Allergies  Allergen Reactions  . Vicodin [Hydrocodone-Acetaminophen] Nausea And Vomiting    Current outpatient prescriptions:albuterol (PROVENTIL HFA;VENTOLIN HFA) 108 (90 BASE) MCG/ACT inhaler, Inhale 2 puffs into the lungs every 6 (six) hours as needed for wheezing., Disp: , Rfl: ;  aspirin 81 MG tablet, Take 81 mg by mouth daily., Disp: , Rfl: ;  Fluticasone-Salmeterol (ADVAIR) 100-50 MCG/DOSE AEPB, Inhale 1 puff into the lungs 2 (two) times daily., Disp: , Rfl:  ibuprofen (ADVIL,MOTRIN) 200 MG tablet, Take 800 mg by mouth every 8 (eight) hours as needed for pain., Disp: , Rfl: ;  lisinopril-hydrochlorothiazide (ZESTORETIC) 20-12.5 MG per tablet, Take 1 tablet by mouth daily., Disp: 30 tablet, Rfl: 2;  loratadine (CLARITIN) 10 MG tablet, Take 10 mg by mouth daily., Disp: , Rfl: ;  naproxen (NAPROSYN) 500 MG tablet, Take 250 mg by mouth 2 (two) times daily as needed (for pain)., Disp: , Rfl:  nicotine (NICODERM CQ) 21 mg/24hr patch, Place 1 patch (21 mg total) onto the skin daily., Disp:  28 patch, Rfl: 0;  ranitidine (ZANTAC) 150 MG capsule, Take 150 mg by mouth 2 (two) times daily., Disp: , Rfl: ;  Vitamin D, Ergocalciferol, (DRISDOL) 50000 UNITS CAPS capsule, Take 1 capsule (50,000 Units total) by mouth every 7 (seven) days., Disp: 12 capsule, Rfl: 0 traMADol (ULTRAM) 50 MG tablet, Take 1 tablet (50 mg total) by mouth every 6 (six) hours as needed for pain., Disp: 15 tablet, Rfl: 0  BP 119/81  Pulse 67  Resp 16  Ht 5\' 5"  (1.651 m)  Wt 148 lb (67.132 kg)  BMI 24.63 kg/m2  Body mass index is 24.63 kg/(m^2).           Review of Systems denies chest pain, dyspnea on exertion, PND, orthopnea. Does complain of wheezing, rashes in her breast, discomfort and legs with walking. All other systems negative and complete review of systems     Objective:   Physical Exam BP 119/81  Pulse 67  Resp 16  Ht 5\' 5"  (1.651 m)  Wt 148 lb (67.132 kg)  BMI 24.63 kg/m2  Gen.-alert and oriented x3 in no apparent distress HEENT normal for age Lungs no rhonchi or wheezing Cardiovascular regular rhythm no murmurs carotid pulses 3+ palpable no bruits audible Abdomen soft nontender no palpable masses Musculoskeletal free of  major deformities Skin clear -no rashes Neurologic normal Lower extremities 1-2+ femoral pulses palpable bilaterally. No popliteal or distal pulses palpable. Both feet adequately perfused with  no evidence of infection gangrene or ulceration.  Today I ordered lower trimming arterial duplex exam and ABIs. ABI on the right is 0.75 and on the left is 0.94 with monophasic flow throughout. She does have evidence of calcification in her aorta.        Assessment:     Likely diffuse aortoiliac and femoral popliteal occlusive disease with limiting but stable claudication-no limb threatening ischemia at present Prolonged tobacco abuse-ongoing COPD     Plan:     Counseled the patient regarding cessation of tobacco use. She is not interested in evaluation of  arterial system with possible stent or bypass Otherwise we'll see back in 6 months with repeat ABIs in evaluation by nurse practitionere If symptoms worsen she will be in touch with [place]

## 2013-03-22 NOTE — Addendum Note (Signed)
Addended by: Mena Goes on: 03/22/2013 03:53 PM   Modules accepted: Orders

## 2013-04-06 ENCOUNTER — Ambulatory Visit: Payer: Self-pay | Admitting: Internal Medicine

## 2013-04-08 ENCOUNTER — Encounter: Payer: Self-pay | Admitting: Internal Medicine

## 2013-04-08 ENCOUNTER — Ambulatory Visit: Payer: No Typology Code available for payment source | Attending: Internal Medicine | Admitting: Internal Medicine

## 2013-04-08 VITALS — BP 133/81 | HR 65 | Temp 98.8°F | Resp 14 | Ht 64.0 in | Wt 158.4 lb

## 2013-04-08 DIAGNOSIS — M79604 Pain in right leg: Secondary | ICD-10-CM

## 2013-04-08 DIAGNOSIS — M79609 Pain in unspecified limb: Secondary | ICD-10-CM | POA: Insufficient documentation

## 2013-04-08 DIAGNOSIS — E785 Hyperlipidemia, unspecified: Secondary | ICD-10-CM | POA: Insufficient documentation

## 2013-04-08 DIAGNOSIS — I10 Essential (primary) hypertension: Secondary | ICD-10-CM | POA: Insufficient documentation

## 2013-04-08 DIAGNOSIS — K219 Gastro-esophageal reflux disease without esophagitis: Secondary | ICD-10-CM | POA: Insufficient documentation

## 2013-04-08 DIAGNOSIS — F17200 Nicotine dependence, unspecified, uncomplicated: Secondary | ICD-10-CM | POA: Insufficient documentation

## 2013-04-08 DIAGNOSIS — Z923 Personal history of irradiation: Secondary | ICD-10-CM | POA: Insufficient documentation

## 2013-04-08 DIAGNOSIS — E559 Vitamin D deficiency, unspecified: Secondary | ICD-10-CM | POA: Insufficient documentation

## 2013-04-08 DIAGNOSIS — J449 Chronic obstructive pulmonary disease, unspecified: Secondary | ICD-10-CM | POA: Insufficient documentation

## 2013-04-08 DIAGNOSIS — M79605 Pain in left leg: Secondary | ICD-10-CM

## 2013-04-08 DIAGNOSIS — Z79899 Other long term (current) drug therapy: Secondary | ICD-10-CM | POA: Insufficient documentation

## 2013-04-08 DIAGNOSIS — J4489 Other specified chronic obstructive pulmonary disease: Secondary | ICD-10-CM | POA: Insufficient documentation

## 2013-04-08 MED ORDER — FLUTICASONE-SALMETEROL 100-50 MCG/DOSE IN AEPB: 1.0000 | INHALATION_SPRAY | Freq: Two times a day (BID) | RESPIRATORY_TRACT | Status: DC

## 2013-04-08 MED ORDER — GABAPENTIN 300 MG PO CAPS: 300.0000 mg | ORAL_CAPSULE | Freq: Every day | ORAL | Status: DC

## 2013-04-08 MED ORDER — ALBUTEROL SULFATE HFA 108 (90 BASE) MCG/ACT IN AERS: 2.0000 | INHALATION_SPRAY | Freq: Four times a day (QID) | RESPIRATORY_TRACT | Status: DC | PRN

## 2013-04-08 NOTE — Progress Notes (Signed)
MRN: [id number] Name: Brandy Stevenson  Sex: female Age: 58 y.o. DOB: [birth year]  Allergies: Vicodin  Chief Complaint  Patient presents with  . Follow-up    HPI: Patient is 58 y.o. female who for followup, recently had a blood work done which was reviewed with the patient, noticed vitamin D deficiency patient has already been started on medication, for hyperlipidemia she has already been advised for low-fat diet, she has history of chronic leg pain and claudication and has followed up with vascular, she takes naproxen for pain and is requesting something additional because she also gets some cramps at night. Patient is trying to quit smoking. She denies any headache dizziness chest and shortness of breath , her blood pressure is well controlled  Past Medical History  Diagnosis Date  . Cancer   . Hypertension   . COPD (chronic obstructive pulmonary disease)   . Hyperlipemia     Past Surgical History  Procedure Laterality Date  . Tubal ligation    . Appendectomy    . Breast surgery    . Breast lumpectomy Left     s/p radiation therapy       Medication List       This list is accurate as of: 04/08/13 10:32 AM.  Always use your most recent med list.               albuterol 108 (90 BASE) MCG/ACT inhaler  Commonly known as:  PROVENTIL HFA;VENTOLIN HFA  Inhale 2 puffs into the lungs every 6 (six) hours as needed for wheezing.     aspirin 81 MG tablet  Take 81 mg by mouth daily.     Fluticasone-Salmeterol 100-50 MCG/DOSE Aepb  Commonly known as:  ADVAIR  Inhale 1 puff into the lungs 2 (two) times daily.     gabapentin 300 MG capsule  Commonly known as:  NEURONTIN  Take 1 capsule (300 mg total) by mouth at bedtime.     ibuprofen 200 MG tablet  Commonly known as:  ADVIL,MOTRIN  Take 800 mg by mouth every 8 (eight) hours as needed for pain.     lisinopril-hydrochlorothiazide 20-12.5 MG per tablet  Commonly known as:  ZESTORETIC  Take 1 tablet by mouth daily.     loratadine 10 MG tablet  Commonly known as:  CLARITIN  Take 10 mg by mouth daily.     naproxen 500 MG tablet  Commonly known as:  NAPROSYN  Take 250 mg by mouth 2 (two) times daily as needed (for pain).     nicotine 21 mg/24hr patch  Commonly known as:  NICODERM CQ  Place 1 patch (21 mg total) onto the skin daily.     ranitidine 150 MG capsule  Commonly known as:  ZANTAC  Take 150 mg by mouth 2 (two) times daily.     traMADol 50 MG tablet  Commonly known as:  ULTRAM  Take 1 tablet (50 mg total) by mouth every 6 (six) hours as needed for pain.     Vitamin D (Ergocalciferol) 50000 UNITS Caps capsule  Commonly known as:  DRISDOL  Take 1 capsule (50,000 Units total) by mouth every 7 (seven) days.        Meds ordered this encounter  Medications  . gabapentin (NEURONTIN) 300 MG capsule    Sig: Take 1 capsule (300 mg total) by mouth at bedtime.    Dispense:  90 capsule    Refill:  0     There is no immunization history  on file for this patient.  No family history on file.  History  Substance Use Topics  . Smoking status: Current Every Day Smoker -- 1.00 packs/day for 40 years    Types: Cigarettes  . Smokeless tobacco: Not on file  . Alcohol Use: No    Review of Systems   As noted in HPI  Filed Vitals:   04/08/13 1007  BP: 133/81  Pulse: 65  Temp: 98.8 F (37.1 C)  Resp: 14    Physical Exam  Physical Exam  Eyes: EOM are normal. Pupils are equal, round, and reactive to light.  Cardiovascular: Normal rate and regular rhythm.   Pulmonary/Chest: Breath sounds normal. No respiratory distress. She has no wheezes. She has no rales.  Musculoskeletal: She exhibits no edema.    CBC    Component Value Date/Time   WBC 5.7 02/23/2013 1300   WBC 4.2 06/12/2008 0939   RBC 4.14 02/23/2013 1300   RBC 4.46 06/12/2008 0939   HGB 13.1 02/23/2013 1300   HGB 13.8 06/12/2008 0939   HCT 37.9 02/23/2013 1300   HCT 40.2 06/12/2008 0939   PLT 261 02/23/2013 1300   PLT 230  06/12/2008 0939   MCV 91.5 02/23/2013 1300   MCV 90.1 06/12/2008 0939   LYMPHSABS 2.0 02/23/2013 1300   LYMPHSABS 1.5 06/12/2008 0939   MONOABS 0.6 02/23/2013 1300   MONOABS 0.6 06/12/2008 0939   EOSABS 0.1 02/23/2013 1300   EOSABS 0.1 06/12/2008 0939   BASOSABS 0.0 02/23/2013 1300   BASOSABS 0.0 06/12/2008 0939    CMP     Component Value Date/Time   NA 140 02/23/2013 1300   K 3.5 02/23/2013 1300   CL 100 02/23/2013 1300   CO2 30 02/23/2013 1300   GLUCOSE 83 02/23/2013 1300   BUN 16 02/23/2013 1300   CREATININE 0.79 02/23/2013 1300   CREATININE 0.76 11/28/2012 1346   CALCIUM 9.5 02/23/2013 1300   PROT 7.7 02/23/2013 1300   ALBUMIN 4.2 02/23/2013 1300   AST 21 02/23/2013 1300   ALT 20 02/23/2013 1300   ALKPHOS 71 02/23/2013 1300   BILITOT 0.5 02/23/2013 1300   GFRNONAA >90 11/28/2012 1346   GFRAA >90 11/28/2012 1346    Lab Results  Component Value Date/Time   CHOL 220* 02/23/2013  1:00 PM    No components found with this basename: hga1c    Lab Results  Component Value Date/Time   AST 21 02/23/2013  1:00 PM    Assessment and Plan  Unspecified vitamin D deficiency Started on vitamin D supplements.  Other and unspecified hyperlipidemia Low-fat diet  Essential hypertension, benign Low salt diet continue with current medication  GERD (gastroesophageal reflux disease) Symptoms controlled continue with Zantac  Leg pain, bilateral - Plan: Patient will try gabapentin (NEURONTIN) 300 MG capsule each bedtime  Smoking In the process to quit smoking, she already has nicotine patch.    Return in about 3 months (around 07/06/2013).  Lorayne Marek, MD

## 2013-04-08 NOTE — Addendum Note (Signed)
Addended by: Darsh Vandevoort, [place] R on: 04/08/2013 04:29 PM   Modules accepted: Orders

## 2013-04-08 NOTE — Addendum Note (Signed)
Addended by: Nakoma Gotwalt, [place] R on: 04/08/2013 04:24 PM   Modules accepted: Orders

## 2013-04-08 NOTE — Progress Notes (Signed)
Pt is here f/u. Complains of bilateral leg pain. No swelling, just an aching pain. Can't walk for a long period of time. Tries to elevate legs to ease the pain. Nothing works to help the pain. Suffers from possible muscle spasms. No OTC medications for pain. Used to take Motrin 800 mg for pain; medication worked for pt.

## 2013-06-10 ENCOUNTER — Other Ambulatory Visit: Payer: Self-pay | Admitting: Internal Medicine

## 2013-07-06 ENCOUNTER — Ambulatory Visit: Payer: No Typology Code available for payment source | Admitting: Internal Medicine

## 2013-07-19 ENCOUNTER — Other Ambulatory Visit: Payer: Self-pay

## 2013-07-19 MED ORDER — NAPROXEN 500 MG PO TABS: 250.0000 mg | ORAL_TABLET | Freq: Two times a day (BID) | ORAL | Status: DC | PRN

## 2013-07-21 ENCOUNTER — Ambulatory Visit: Payer: No Typology Code available for payment source | Attending: Internal Medicine | Admitting: *Deleted

## 2013-07-21 VITALS — BP 151/93 | HR 56 | Temp 98.2°F | Resp 14

## 2013-07-21 DIAGNOSIS — I10 Essential (primary) hypertension: Secondary | ICD-10-CM

## 2013-07-21 MED ORDER — CLONIDINE HCL 0.1 MG PO TABS
0.1000 mg | ORAL_TABLET | Freq: Once | ORAL | Status: AC
  Administered 2013-07-21: 0.1 mg via ORAL

## 2013-07-21 MED ORDER — LISINOPRIL-HYDROCHLOROTHIAZIDE 20-12.5 MG PO TABS: 1.0000 | ORAL_TABLET | Freq: Every day | ORAL | Status: DC

## 2013-07-21 NOTE — Patient Instructions (Signed)

## 2013-07-21 NOTE — Progress Notes (Unsigned)
Patient here today for blood pressure check. Patient states she has had a headache since Friday. Patient given Clonidine 0.1 mg for BP reading.  BP checked after 30 minutes was still elevated. Informed patient to sit in lobby for about 30-45 minutes and will recheck blood pressure. Blood pressure rechecked and 151/93. Alverda Skeans, RN

## 2013-08-24 ENCOUNTER — Emergency Department (HOSPITAL_COMMUNITY)
Admission: EM | Admit: 2013-08-24 | Discharge: 2013-08-24 | Disposition: A | Discharge status: Home / Self Care | Payer: No Typology Code available for payment source | Attending: Emergency Medicine | Admitting: Emergency Medicine

## 2013-08-24 ENCOUNTER — Encounter (HOSPITAL_COMMUNITY): Payer: Self-pay | Admitting: Emergency Medicine

## 2013-08-24 DIAGNOSIS — R519 Headache, unspecified: Secondary | ICD-10-CM

## 2013-08-24 DIAGNOSIS — Z7982 Long term (current) use of aspirin: Secondary | ICD-10-CM | POA: Insufficient documentation

## 2013-08-24 DIAGNOSIS — Z79899 Other long term (current) drug therapy: Secondary | ICD-10-CM | POA: Insufficient documentation

## 2013-08-24 DIAGNOSIS — Z791 Long term (current) use of non-steroidal anti-inflammatories (NSAID): Secondary | ICD-10-CM | POA: Insufficient documentation

## 2013-08-24 DIAGNOSIS — J4489 Other specified chronic obstructive pulmonary disease: Secondary | ICD-10-CM | POA: Insufficient documentation

## 2013-08-24 DIAGNOSIS — F17200 Nicotine dependence, unspecified, uncomplicated: Secondary | ICD-10-CM | POA: Insufficient documentation

## 2013-08-24 DIAGNOSIS — R51 Headache: Secondary | ICD-10-CM | POA: Insufficient documentation

## 2013-08-24 DIAGNOSIS — I10 Essential (primary) hypertension: Secondary | ICD-10-CM | POA: Insufficient documentation

## 2013-08-24 DIAGNOSIS — Z859 Personal history of malignant neoplasm, unspecified: Secondary | ICD-10-CM | POA: Insufficient documentation

## 2013-08-24 DIAGNOSIS — E785 Hyperlipidemia, unspecified: Secondary | ICD-10-CM | POA: Insufficient documentation

## 2013-08-24 DIAGNOSIS — IMO0002 Reserved for concepts with insufficient information to code with codable children: Secondary | ICD-10-CM | POA: Insufficient documentation

## 2013-08-24 DIAGNOSIS — J449 Chronic obstructive pulmonary disease, unspecified: Secondary | ICD-10-CM | POA: Insufficient documentation

## 2013-08-24 LAB — COMPREHENSIVE METABOLIC PANEL
ALT: 28 U/L (ref 0–35)
AST: 26 U/L (ref 0–37)
Albumin: 4.2 g/dL (ref 3.5–5.2)
Alkaline Phosphatase: 72 U/L (ref 39–117)
BUN: 14 mg/dL (ref 6–23)
CO2: 26 mEq/L (ref 19–32)
Calcium: 9.7 mg/dL (ref 8.4–10.5)
Chloride: 102 mEq/L (ref 96–112)
Creatinine, Ser: 0.82 mg/dL (ref 0.50–1.10)
GFR calc Af Amer: >90 mL/min (ref >90)
GFR calc non Af Amer: 79 mL/min — ABNORMAL LOW (ref >90)
Glucose, Bld: 71 mg/dL (ref 70–99)
Potassium: 3.2 mEq/L — ABNORMAL LOW (ref 3.7–5.3)
Sodium: 142 mEq/L (ref 137–147)
Total Bilirubin: 0.3 mg/dL (ref 0.3–1.2)
Total Protein: 8.1 g/dL (ref 6.0–8.3)

## 2013-08-24 LAB — CBC WITH DIFFERENTIAL/PLATELET
Basophils Absolute: 0 10*3/uL (ref 0.0–0.1)
Basophils Relative: 1 % (ref 0–1)
Eosinophils Absolute: 0.1 10*3/uL (ref 0.0–0.7)
Eosinophils Relative: 2 % (ref 0–5)
HCT: 36.4 % (ref 36.0–46.0)
Hemoglobin: 12.5 g/dL (ref 12.0–15.0)
Lymphocytes Relative: 38 % (ref 12–46)
Lymphs Abs: 1.7 10*3/uL (ref 0.7–4.0)
MCH: 31.6 pg (ref 26.0–34.0)
MCHC: 34.3 g/dL (ref 30.0–36.0)
MCV: 91.9 fL (ref 78.0–100.0)
Monocytes Absolute: 0.4 10*3/uL (ref 0.1–1.0)
Monocytes Relative: 10 % (ref 3–12)
Neutro Abs: 2.3 10*3/uL (ref 1.7–7.7)
Neutrophils Relative %: 49 % (ref 43–77)
Platelets: 228 10*3/uL (ref 150–400)
RBC: 3.96 MIL/uL (ref 3.87–5.11)
RDW: 13.4 % (ref 11.5–15.5)
WBC: 4.6 10*3/uL (ref 4.0–10.5)

## 2013-08-24 LAB — URINALYSIS, ROUTINE W REFLEX MICROSCOPIC
Bilirubin Urine: NEGATIVE
Glucose, UA: NEGATIVE mg/dL
Hgb urine dipstick: NEGATIVE
Ketones, ur: NEGATIVE mg/dL
Leukocytes, UA: NEGATIVE
Nitrite: NEGATIVE
Protein, ur: NEGATIVE mg/dL
Specific Gravity, Urine: 1.003 — ABNORMAL LOW (ref 1.005–1.030)
Urobilinogen, UA: 0.2 mg/dL (ref 0.0–1.0)
pH: 6.5 (ref 5.0–8.0)

## 2013-08-24 LAB — LIPASE, BLOOD: Lipase: 79 U/L — ABNORMAL HIGH (ref 11–59)

## 2013-08-24 MED ORDER — DIPHENHYDRAMINE HCL 50 MG/ML IJ SOLN
25.0000 mg | Freq: Once | INTRAMUSCULAR | Status: AC
  Administered 2013-08-24: 25 mg via INTRAVENOUS
  Filled 2013-08-24: qty 1

## 2013-08-24 MED ORDER — KETOROLAC TROMETHAMINE 15 MG/ML IJ SOLN
15.0000 mg | Freq: Once | INTRAMUSCULAR | Status: AC
  Administered 2013-08-24: 15 mg via INTRAVENOUS
  Filled 2013-08-24: qty 1

## 2013-08-24 MED ORDER — SODIUM CHLORIDE 0.9 % IV BOLUS (SEPSIS)
1000.0000 mL | Freq: Once | INTRAVENOUS | Status: AC
  Administered 2013-08-24: 1000 mL via INTRAVENOUS

## 2013-08-24 MED ORDER — DEXAMETHASONE SODIUM PHOSPHATE 10 MG/ML IJ SOLN
10.0000 mg | Freq: Once | INTRAMUSCULAR | Status: AC
  Administered 2013-08-24: 10 mg via INTRAVENOUS
  Filled 2013-08-24: qty 1

## 2013-08-24 MED ORDER — METOCLOPRAMIDE HCL 5 MG/ML IJ SOLN
10.0000 mg | Freq: Once | INTRAMUSCULAR | Status: AC
  Administered 2013-08-24: 10 mg via INTRAVENOUS
  Filled 2013-08-24: qty 2

## 2013-08-24 NOTE — ED Notes (Addendum)
Pt c/o headache x 6 days,  bilateral arm pain x 2 weeks, intermittent sharp RLQ abdominal pain x 2-3 days, and polyuria since last night.  Pain score 6/10.  Pt reports appointment w/ PCP, on Friday.  Denies blurred vision and injury to arms.

## 2013-08-24 NOTE — ED Provider Notes (Addendum)
CSN: [id number]     Arrival date & time 08/24/13  1620 History   First MD Initiated Contact with Patient 08/24/13 1926     Chief Complaint  Patient presents with  . Headache  . Arm Pain     (Consider location/radiation/quality/duration/timing/severity/associated sxs/prior Treatment) HPI Comments: Pt has been out of BP and pain meds for over a week.  Got BP meds today and took one but headache did not resolve.  States similar to prior HA's.  No focal neuro sx.  Pt also states that out of naproxen for last week and has noticed aching in bilateral arms without CP or SOB.  Worse with movement and muscles feel tight. Pt denies fever, tick bite or heat exposure.  Initially told triage abd pain but she denies any abd pain currently but has had frequent urination at night.  No hx of DM.  Pt has appt with PCP and pain management on Friday.  Patient is a 58 y.o. female presenting with headaches and arm pain. The history is provided by the patient.  Headache Pain location:  Frontal Quality:  Dull Radiates to:  Does not radiate Severity currently:  6/10 Severity at highest:  6/10 Onset quality:  Gradual Duration:  1 week Timing:  Constant Progression:  Waxing and waning Chronicity:  Recurrent Similar to prior headaches: yes   Context: bright light and loud noise   Relieved by:  NSAIDs Worsened by:  Light and sound Ineffective treatments:  Acetaminophen Associated symptoms: focal weakness, nausea and photophobia   Associated symptoms: no blurred vision, no cough, no diarrhea, no fever, no visual change, no vomiting and no weakness   Arm Pain Associated symptoms include headaches.    Past Medical History  Diagnosis Date  . Cancer   . Hypertension   . COPD (chronic obstructive pulmonary disease)   . Hyperlipemia    Past Surgical History  Procedure Laterality Date  . Tubal ligation    . Appendectomy    . Breast surgery    . Breast lumpectomy Left     s/p radiation therapy     History reviewed. No pertinent family history. History  Substance Use Topics  . Smoking status: Current Every Day Smoker -- 0.25 packs/day for 40 years    Types: Cigarettes  . Smokeless tobacco: Not on file  . Alcohol Use: No   OB History   Grav Para Term Preterm Abortions TAB SAB Ect Mult Living                 Review of Systems  Constitutional: Negative for fever and chills.  Eyes: Positive for photophobia. Negative for blurred vision.  Respiratory: Negative for cough.   Gastrointestinal: Positive for nausea. Negative for vomiting and diarrhea.  Genitourinary: Positive for frequency. Negative for vaginal bleeding and vaginal discharge.  Neurological: Positive for focal weakness and headaches.  All other systems reviewed and are negative.     Allergies  Vicodin  Home Medications   Prior to Admission medications   Medication Sig Start Date End Date Taking? Authorizing Araina Butrick  albuterol (PROVENTIL HFA;VENTOLIN HFA) 108 (90 BASE) MCG/ACT inhaler Inhale 2 puffs into the lungs every 6 (six) hours as needed for wheezing. 04/08/13   Lorayne Marek, MD  aspirin 81 MG tablet Take 81 mg by mouth daily.    Historical Aleza Pew, MD  Fluticasone-Salmeterol (ADVAIR) 100-50 MCG/DOSE AEPB Inhale 1 puff into the lungs 2 (two) times daily. 04/08/13   Lorayne Marek, MD  gabapentin (NEURONTIN) 300 MG capsule  Take 1 capsule (300 mg total) by mouth at bedtime. 04/08/13   Lorayne Marek, MD  ibuprofen (ADVIL,MOTRIN) 200 MG tablet Take 800 mg by mouth every 8 (eight) hours as needed for pain.    Historical Goldie Dimmer, MD  lisinopril-hydrochlorothiazide (PRINZIDE,ZESTORETIC) 20-12.5 MG per tablet Take 1 tablet by mouth daily. 07/21/13   Angelica Chessman, MD  loratadine (CLARITIN) 10 MG tablet Take 10 mg by mouth daily.    Historical Donnavin Vandenbrink, MD  naproxen (NAPROSYN) 500 MG tablet Take 0.5 tablets (250 mg total) by mouth 2 (two) times daily as needed (for pain). 07/19/13   Lorayne Marek, MD  nicotine  (NICODERM CQ) 21 mg/24hr patch Place 1 patch (21 mg total) onto the skin daily. 02/23/13   Lorayne Marek, MD  ranitidine (ZANTAC) 150 MG capsule Take 150 mg by mouth 2 (two) times daily.    Historical Casmira Cramer, MD  traMADol (ULTRAM) 50 MG tablet Take 1 tablet (50 mg total) by mouth every 6 (six) hours as needed for pain. 11/28/12   Ephraim Hamburger, MD  Vitamin D, Ergocalciferol, (DRISDOL) 50000 UNITS CAPS capsule Take 1 capsule (50,000 Units total) by mouth every 7 (seven) days. 02/28/13   Lorayne Marek, MD   BP 157/94  Pulse 61  Temp(Src) 99.2 F (37.3 C) (Oral)  Resp 18  SpO2 100% Physical Exam  Nursing note and vitals reviewed. Constitutional: She is oriented to person, place, and time. She appears well-developed and well-nourished. No distress.  HENT:  Head: Normocephalic and atraumatic.  Eyes: EOM are normal. Pupils are equal, round, and reactive to light.  No photophobia  Neck: Muscular tenderness present.    Cardiovascular: Normal rate, regular rhythm, normal heart sounds and intact distal pulses.  Exam reveals no friction rub.   No murmur heard. Pulmonary/Chest: Effort normal and breath sounds normal. She has no wheezes. She has no rales.  Abdominal: Soft. Bowel sounds are normal. She exhibits no distension. There is no tenderness. There is no rebound and no guarding.  Musculoskeletal: Normal range of motion. She exhibits no tenderness.  No edema  Neurological: She is alert and oriented to person, place, and time. She has normal strength. No cranial nerve deficit or sensory deficit.  Skin: Skin is warm and dry. No rash noted.  Psychiatric: She has a normal mood and affect. Her behavior is normal.    ED Course  Procedures (including critical care time) Labs Review Labs Reviewed  COMPREHENSIVE METABOLIC PANEL - Abnormal; Notable for the following:    Potassium 3.2 (*)    GFR calc non Af Amer 79 (*)    All other components within normal limits  LIPASE, BLOOD - Abnormal;  Notable for the following:    Lipase 79 (*)    All other components within normal limits  URINALYSIS, ROUTINE W REFLEX MICROSCOPIC - Abnormal; Notable for the following:    Specific Gravity, Urine 1.003 (*)    All other components within normal limits  CBC WITH DIFFERENTIAL    Imaging Review No results found.   Date: 08/24/2013  Rate: 64  Rhythm: normal sinus rhythm  QRS Axis: normal  Intervals: normal  ST/T Wave abnormalities: normal  Conduction Disutrbances: none  Narrative Interpretation: unremarkable      MDM   Final diagnoses:  Nonintractable episodic headache, unspecified headache type    Pt with HA without sx suggestive of SAH(sudden onset, worst of life, or deficits), infection, or cavernous vein thrombosis.  States in the past she has had HA but not for  awhile.  Pt states off meds for last 1 week cause out but took BP meds today because px was refilled.  Pt states HA did not improved after BP meds.  Pt BP is 157/94 here and denies any infectious sx.  Normal neuro exam and vital signs. Will give HA cocktail and re-eval.  Secondly the patient is complaining of bilateral upper extremity pain and aching which is also been ongoing for the last 10 weeks and she's been out of her medications. She typically takes a Naprosyn twice a day that she is given by her pain clinic but has not been taking that. She describes the pain in her arms and HTN denies any chest pain or shortness of breath. Feel most likely that this is related to not taking anti-inflammatories. We'll do a screening EKG. Patient is also complaining of urinary frequency but denies any dysuria and currently has no abdominal tenderness on my exam.  Patient's labs and UA are within normal limits. Her lipase is slightly elevated however she has no left upper quadrant tenderness and denies any alcohol use.  Patient states she has an appointment with her PCP and pain doctor on Friday and states they just think she needs  help controlling the headache  9:11 PM Patient much better after headache cocktail. EKG within normal limits. Blood pressure remains the same at 157/93. Patient will continue her blood pressure medication and follow up with her doctor as planned  Blanchie Dessert, MD 08/24/13 2112  Blanchie Dessert, MD 08/24/13 2114  Blanchie Dessert, MD 08/24/13 2115

## 2013-08-26 ENCOUNTER — Ambulatory Visit: Payer: Self-pay | Attending: Internal Medicine | Admitting: Internal Medicine

## 2013-08-26 ENCOUNTER — Encounter: Payer: Self-pay | Admitting: Internal Medicine

## 2013-08-26 VITALS — BP 111/70 | HR 78 | Temp 98.2°F | Resp 17 | Wt 151.8 lb

## 2013-08-26 DIAGNOSIS — E876 Hypokalemia: Secondary | ICD-10-CM

## 2013-08-26 DIAGNOSIS — J4489 Other specified chronic obstructive pulmonary disease: Secondary | ICD-10-CM | POA: Insufficient documentation

## 2013-08-26 DIAGNOSIS — M79604 Pain in right leg: Secondary | ICD-10-CM

## 2013-08-26 DIAGNOSIS — M79605 Pain in left leg: Secondary | ICD-10-CM

## 2013-08-26 DIAGNOSIS — J449 Chronic obstructive pulmonary disease, unspecified: Secondary | ICD-10-CM | POA: Insufficient documentation

## 2013-08-26 DIAGNOSIS — F17200 Nicotine dependence, unspecified, uncomplicated: Secondary | ICD-10-CM | POA: Insufficient documentation

## 2013-08-26 DIAGNOSIS — E785 Hyperlipidemia, unspecified: Secondary | ICD-10-CM | POA: Insufficient documentation

## 2013-08-26 DIAGNOSIS — I10 Essential (primary) hypertension: Secondary | ICD-10-CM | POA: Insufficient documentation

## 2013-08-26 DIAGNOSIS — K219 Gastro-esophageal reflux disease without esophagitis: Secondary | ICD-10-CM | POA: Insufficient documentation

## 2013-08-26 DIAGNOSIS — Z79899 Other long term (current) drug therapy: Secondary | ICD-10-CM | POA: Insufficient documentation

## 2013-08-26 DIAGNOSIS — M79609 Pain in unspecified limb: Secondary | ICD-10-CM

## 2013-08-26 DIAGNOSIS — Z7982 Long term (current) use of aspirin: Secondary | ICD-10-CM | POA: Insufficient documentation

## 2013-08-26 MED ORDER — GABAPENTIN 300 MG PO CAPS: 300.0000 mg | ORAL_CAPSULE | Freq: Every day | ORAL | Status: DC

## 2013-08-26 MED ORDER — NAPROXEN 500 MG PO TABS: 250.0000 mg | ORAL_TABLET | Freq: Two times a day (BID) | ORAL | Status: DC | PRN

## 2013-08-26 NOTE — Progress Notes (Signed)
Patient states here for her regular check up Complains of still having pain and cramps To her right leg

## 2013-08-26 NOTE — Progress Notes (Signed)
MRN: [id number] Name: Brandy Stevenson  Sex: female Age: 58 y.o. DOB: August 07, 1955  Allergies: Vicodin  Chief Complaint  Patient presents with  . Follow-up    HPI: Patient is 58 y.o. female who has to of hypertension GERD COPD comes today for followup her, patient still does smoke cigarettes, I have advised patient to quit smoking, patient is going to try, she also has chronic leg pain, she was prescribed Neurontin on the last visit as per patient she could not afford and could not get it, patient recently had a blood work done noticed low potassium level when she was in the emergency room a few days ago with symptoms of headache, patient not sure if she was given potassium supplement, currently she denies any acute symptoms. Her blood pressure is well controlled.  Past Medical History  Diagnosis Date  . Cancer   . Hypertension   . COPD (chronic obstructive pulmonary disease)   . Hyperlipemia     Past Surgical History  Procedure Laterality Date  . Tubal ligation    . Appendectomy    . Breast surgery    . Breast lumpectomy Left     s/p radiation therapy       Medication List       This list is accurate as of: 08/26/13 11:29 AM.  Always use your most recent med list.               albuterol 108 (90 BASE) MCG/ACT inhaler  Commonly known as:  PROVENTIL HFA;VENTOLIN HFA  Inhale 2 puffs into the lungs every 6 (six) hours as needed for wheezing.     aspirin 81 MG tablet  Take 81 mg by mouth daily.     Fluticasone-Salmeterol 100-50 MCG/DOSE Aepb  Commonly known as:  ADVAIR  Inhale 1 puff into the lungs 2 (two) times daily.     gabapentin 300 MG capsule  Commonly known as:  NEURONTIN  Take 1 capsule (300 mg total) by mouth at bedtime.     ibuprofen 200 MG tablet  Commonly known as:  ADVIL,MOTRIN  Take 800 mg by mouth every 8 (eight) hours as needed for pain.     lisinopril-hydrochlorothiazide 20-12.5 MG per tablet  Commonly known as:  PRINZIDE,ZESTORETIC  Take 1  tablet by mouth daily.     naproxen 500 MG tablet  Commonly known as:  NAPROSYN  Take 0.5 tablets (250 mg total) by mouth 2 (two) times daily as needed (for pain).     ranitidine 150 MG capsule  Commonly known as:  ZANTAC  Take 150 mg by mouth 2 (two) times daily.     Vitamin D (Ergocalciferol) 50000 UNITS Caps capsule  Commonly known as:  DRISDOL  Take 1 capsule (50,000 Units total) by mouth every 7 (seven) days.        Meds ordered this encounter  Medications  . gabapentin (NEURONTIN) 300 MG capsule    Sig: Take 1 capsule (300 mg total) by mouth at bedtime.    Dispense:  90 capsule    Refill:  0  . naproxen (NAPROSYN) 500 MG tablet    Sig: Take 0.5 tablets (250 mg total) by mouth 2 (two) times daily as needed (for pain).    Dispense:  30 tablet    Refill:  0     There is no immunization history on file for this patient.  History reviewed. No pertinent family history.  History  Substance Use Topics  . Smoking status: Current Every  Day Smoker -- 0.25 packs/day for 40 years    Types: Cigarettes  . Smokeless tobacco: Not on file  . Alcohol Use: No    Review of Systems   As noted in HPI  Filed Vitals:   08/26/13 1046  BP: 111/70  Pulse: 78  Temp: 98.2 F (36.8 C)  Resp: 17    Physical Exam  Physical Exam  Eyes: EOM are normal. Pupils are equal, round, and reactive to light.  Cardiovascular: Normal rate and regular rhythm.   Pulmonary/Chest: Breath sounds normal. No respiratory distress. She has no wheezes. She has no rales.  Musculoskeletal: She exhibits no edema.    CBC    Component Value Date/Time   WBC 4.6 08/24/2013 1717   WBC 4.2 06/12/2008 0939   RBC 3.96 08/24/2013 1717   RBC 4.46 06/12/2008 0939   HGB 12.5 08/24/2013 1717   HGB 13.8 06/12/2008 0939   HCT 36.4 08/24/2013 1717   HCT 40.2 06/12/2008 0939   PLT 228 08/24/2013 1717   PLT 230 06/12/2008 0939   MCV 91.9 08/24/2013 1717   MCV 90.1 06/12/2008 0939   LYMPHSABS 1.7 08/24/2013 1717    LYMPHSABS 1.5 06/12/2008 0939   MONOABS 0.4 08/24/2013 1717   MONOABS 0.6 06/12/2008 0939   EOSABS 0.1 08/24/2013 1717   EOSABS 0.1 06/12/2008 0939   BASOSABS 0.0 08/24/2013 1717   BASOSABS 0.0 06/12/2008 0939    CMP     Component Value Date/Time   NA 142 08/24/2013 1717   K 3.2* 08/24/2013 1717   CL 102 08/24/2013 1717   CO2 26 08/24/2013 1717   GLUCOSE 71 08/24/2013 1717   BUN 14 08/24/2013 1717   CREATININE 0.82 08/24/2013 1717   CREATININE 0.79 02/23/2013 1300   CALCIUM 9.7 08/24/2013 1717   PROT 8.1 08/24/2013 1717   ALBUMIN 4.2 08/24/2013 1717   AST 26 08/24/2013 1717   ALT 28 08/24/2013 1717   ALKPHOS 72 08/24/2013 1717   BILITOT 0.3 08/24/2013 1717   GFRNONAA 79* 08/24/2013 1717   GFRNONAA 84 02/23/2013 1300   GFRAA >90 08/24/2013 1717   GFRAA >89 02/23/2013 1300    Lab Results  Component Value Date/Time   CHOL 220* 02/23/2013  1:00 PM    No components found with this basename: hga1c    Lab Results  Component Value Date/Time   AST 26 08/24/2013  5:17 PM    Assessment and Plan  Essential hypertension, benign - Plan: Continue with current medication, will repeat blood chemistry COMPLETE METABOLIC PANEL WITH GFR  Gastroesophageal reflux disease without esophagitis Taking Zantac symptoms stable.  Other and unspecified hyperlipidemia Current meds of her diet. Repeat her fasting lipids and on the next visit.  Leg pain, bilateral - Plan: gabapentin (NEURONTIN) 300 MG capsule, naproxen (NAPROSYN) 500 MG tablet  Hypokalemia - Plan: Repeat blood chemistry COMPLETE METABOLIC PANEL WITH GFR   Return in about 3 months (around 11/26/2013) for hypertension.  Lorayne Marek, MD

## 2013-08-27 LAB — COMPLETE METABOLIC PANEL WITH GFR
ALT: 26 U/L (ref 0–35)
AST: 24 U/L (ref 0–37)
Albumin: 4.1 g/dL (ref 3.5–5.2)
Alkaline Phosphatase: 66 U/L (ref 39–117)
BUN: 14 mg/dL (ref 6–23)
CO2: 29 mEq/L (ref 19–32)
Calcium: 9.5 mg/dL (ref 8.4–10.5)
Chloride: 103 mEq/L (ref 96–112)
Creat: 0.89 mg/dL (ref 0.50–1.10)
GFR, Est African American: 84 mL/min
GFR, Est Non African American: 73 mL/min
Glucose, Bld: 84 mg/dL (ref 70–99)
Potassium: 3.3 mEq/L — ABNORMAL LOW (ref 3.5–5.3)
Sodium: 141 mEq/L (ref 135–145)
Total Bilirubin: 0.2 mg/dL (ref 0.2–1.2)
Total Protein: 7.3 g/dL (ref 6.0–8.3)

## 2013-08-30 ENCOUNTER — Telehealth: Payer: Self-pay

## 2013-08-30 MED ORDER — POTASSIUM CHLORIDE ER 10 MEQ PO TBCR: 10.0000 meq | EXTENDED_RELEASE_TABLET | Freq: Every day | ORAL | Status: DC

## 2013-08-30 NOTE — Telephone Encounter (Signed)
Message copied by Dorothe Pea on Tue Aug 30, 2013  9:13 AM ------      Message from: Lorayne Marek      Created: Mon Aug 29, 2013  1:03 PM       Blood work reviewed her potassium is borderline low, advise patient to take KCL 10 mEq daily for 10 days.             ------

## 2013-08-30 NOTE — Telephone Encounter (Signed)
Patient is aware of her lab results [facility]  

## 2013-09-01 ENCOUNTER — Other Ambulatory Visit: Payer: Self-pay | Admitting: Internal Medicine

## 2013-09-16 ENCOUNTER — Ambulatory Visit: Payer: No Typology Code available for payment source | Attending: Internal Medicine

## 2013-09-19 ENCOUNTER — Other Ambulatory Visit: Payer: Self-pay

## 2013-09-19 MED ORDER — RANITIDINE HCL 150 MG PO CAPS: 150.0000 mg | ORAL_CAPSULE | Freq: Two times a day (BID) | ORAL | Status: DC

## 2013-09-23 ENCOUNTER — Other Ambulatory Visit: Payer: Self-pay | Admitting: Internal Medicine

## 2013-10-03 ENCOUNTER — Encounter: Payer: Self-pay | Admitting: Family

## 2013-10-04 ENCOUNTER — Ambulatory Visit (HOSPITAL_COMMUNITY)
Admission: RE | Admit: 2013-10-04 | Discharge: 2013-10-04 | Disposition: A | Discharge status: Home / Self Care | Payer: No Typology Code available for payment source | Source: Ambulatory Visit | Attending: Family | Admitting: Family

## 2013-10-04 ENCOUNTER — Encounter: Payer: Self-pay | Admitting: Family

## 2013-10-04 ENCOUNTER — Ambulatory Visit (INDEPENDENT_AMBULATORY_CARE_PROVIDER_SITE_OTHER): Payer: Self-pay | Admitting: Family

## 2013-10-04 VITALS — BP 111/85 | HR 57 | Resp 16 | Ht 64.0 in | Wt 151.0 lb

## 2013-10-04 DIAGNOSIS — M79604 Pain in right leg: Secondary | ICD-10-CM

## 2013-10-04 DIAGNOSIS — M79605 Pain in left leg: Secondary | ICD-10-CM

## 2013-10-04 DIAGNOSIS — M79609 Pain in unspecified limb: Secondary | ICD-10-CM

## 2013-10-04 DIAGNOSIS — I70219 Atherosclerosis of native arteries of extremities with intermittent claudication, unspecified extremity: Secondary | ICD-10-CM

## 2013-10-04 DIAGNOSIS — R252 Cramp and spasm: Secondary | ICD-10-CM

## 2013-10-04 NOTE — Patient Instructions (Signed)
Peripheral Vascular Disease Peripheral Vascular Disease (PVD), also called Peripheral Arterial Disease (PAD), is a circulation problem caused by cholesterol (atherosclerotic plaque) deposits in the arteries. PVD commonly occurs in the lower extremities (legs) but it can occur in other areas of the body, such as your arms. The cholesterol buildup in the arteries reduces blood flow which can cause pain and other serious problems. The presence of PVD can place a person at risk for Coronary Artery Disease (CAD).  CAUSES  Causes of PVD can be many. It is usually associated with more than one risk factor such as:   High Cholesterol.  Smoking.  Diabetes.  Lack of exercise or inactivity.  High blood pressure (hypertension).  Obesity.  Family history. SYMPTOMS   When the lower extremities are affected, patients with PVD may experience:  Leg pain with exertion or physical activity. This is called INTERMITTENT CLAUDICATION. This may present as cramping or numbness with physical activity. The location of the pain is associated with the level of blockage. For example, blockage at the abdominal level (distal abdominal aorta) may result in buttock or hip pain. Lower leg arterial blockage may result in calf pain.  As PVD becomes more severe, pain can develop with less physical activity.  In people with severe PVD, leg pain may occur at rest.  Other PVD signs and symptoms:  Leg numbness or weakness.  Coldness in the affected leg or foot, especially when compared to the other leg.  A change in leg color.  Patients with significant PVD are more prone to ulcers or sores on toes, feet or legs. These may take longer to heal or may reoccur. The ulcers or sores can become infected.  If signs and symptoms of PVD are ignored, gangrene may occur. This can result in the loss of toes or loss of an entire limb.  Not all leg pain is related to PVD. Other medical conditions can cause leg pain such  as:  Blood clots (embolism) or Deep Vein Thrombosis.  Inflammation of the blood vessels (vasculitis).  Spinal stenosis. DIAGNOSIS  Diagnosis of PVD can involve several different types of tests. These can include:  Pulse Volume Recording Method (PVR). This test is simple, painless and does not involve the use of X-rays. PVR involves measuring and comparing the blood pressure in the arms and legs. An ABI (Ankle-Brachial Index) is calculated. The normal ratio of blood pressures is 1. As this number becomes smaller, it indicates more severe disease.  < 0.95 - indicates significant narrowing in one or more leg vessels.  <0.8 - there will usually be pain in the foot, leg or buttock with exercise.  <0.4 - will usually have pain in the legs at rest.  <0.25 - usually indicates limb threatening PVD.  Doppler detection of pulses in the legs. This test is painless and checks to see if you have a pulses in your legs/feet.  A dye or contrast material (a substance that highlights the blood vessels so they show up on x-ray) may be given to help your caregiver better see the arteries for the following tests. The dye is eliminated from your body by the kidney's. Your caregiver may order blood work to check your kidney function and other laboratory values before the following tests are performed:  Magnetic Resonance Angiography (MRA). An MRA is a picture study of the blood vessels and arteries. The MRA machine uses a large magnet to produce images of the blood vessels.  Computed Tomography Angiography (CTA). A CTA   is a specialized x-ray that looks at how the blood flows in your blood vessels. An IV may be inserted into your arm so contrast dye can be injected.  Angiogram. Is a procedure that uses x-rays to look at your blood vessels. This procedure is minimally invasive, meaning a small incision (cut) is made in your groin. A small tube (catheter) is then inserted into the artery of your groin. The catheter  is guided to the blood vessel or artery your caregiver wants to examine. Contrast dye is injected into the catheter. X-rays are then taken of the blood vessel or artery. After the images are obtained, the catheter is taken out. TREATMENT  Treatment of PVD involves many interventions which may include:  Lifestyle changes:  Quitting smoking.  Exercise.  Following a low fat, low cholesterol diet.  Control of diabetes.  Foot care is very important to the PVD patient. Good foot care can help prevent infection.  Medication:  Cholesterol-lowering medicine.  Blood pressure medicine.  Anti-platelet drugs.  Certain medicines may reduce symptoms of Intermittent Claudication.  Interventional/Surgical options:  Angioplasty. An Angioplasty is a procedure that inflates a balloon in the blocked artery. This opens the blocked artery to improve blood flow.  Stent Implant. A wire mesh tube (stent) is placed in the artery. The stent expands and stays in place, allowing the artery to remain open.  Peripheral Bypass Surgery. This is a surgical procedure that reroutes the blood around a blocked artery to help improve blood flow. This type of procedure may be performed if Angioplasty or stent implants are not an option. SEEK IMMEDIATE MEDICAL CARE IF:   You develop pain or numbness in your arms or legs.  Your arm or leg turns cold, becomes blue in color.  You develop redness, warmth, swelling and pain in your arms or legs. MAKE SURE YOU:   Understand these instructions.  Will watch your condition.  Will get help right away if you are not doing well or get worse. Document Released: 03/27/2004 Document Revised: 05/12/2011 Document Reviewed: 02/22/2008 ExitCare Patient Information 2015 [employer]. This information is not intended to replace advice given to you by your health care provider. Make sure you discuss any questions you have with your health care provider.   Smoking  Cessation Quitting smoking is important to your health and has many advantages. However, it is not always easy to quit since nicotine is a very addictive drug. Oftentimes, people try 3 times or more before being able to quit. This document explains the best ways for you to prepare to quit smoking. Quitting takes hard work and a lot of effort, but you can do it. ADVANTAGES OF QUITTING SMOKING  You will live longer, feel better, and live better.  Your body will feel the impact of quitting smoking almost immediately.  Within 20 minutes, blood pressure decreases. Your pulse returns to its normal level.  After 8 hours, carbon monoxide levels in the blood return to normal. Your oxygen level increases.  After 24 hours, the chance of having a heart attack starts to decrease. Your breath, hair, and body stop smelling like smoke.  After 48 hours, damaged nerve endings begin to recover. Your sense of taste and smell improve.  After 72 hours, the body is virtually free of nicotine. Your bronchial tubes relax and breathing becomes easier.  After 2 to 12 weeks, lungs can hold more air. Exercise becomes easier and circulation improves.  The risk of having a heart attack, stroke, cancer,   or lung disease is greatly reduced.  After 1 year, the risk of coronary heart disease is cut in half.  After 5 years, the risk of stroke falls to the same as a nonsmoker.  After 10 years, the risk of lung cancer is cut in half and the risk of other cancers decreases significantly.  After 15 years, the risk of coronary heart disease drops, usually to the level of a nonsmoker.  If you are pregnant, quitting smoking will improve your chances of having a healthy baby.  The people you live with, especially any children, will be healthier.  You will have extra money to spend on things other than cigarettes. QUESTIONS TO THINK ABOUT BEFORE ATTEMPTING TO QUIT You may want to talk about your answers with your health care  provider.  Why do you want to quit?  If you tried to quit in the past, what helped and what did not?  What will be the most difficult situations for you after you quit? How will you plan to handle them?  Who can help you through the tough times? Your family? Friends? A health care provider?  What pleasures do you get from smoking? What ways can you still get pleasure if you quit? Here are some questions to ask your health care provider:  How can you help me to be successful at quitting?  What medicine do you think would be best for me and how should I take it?  What should I do if I need more help?  What is smoking withdrawal like? How can I get information on withdrawal? GET READY  Set a quit date.  Change your environment by getting rid of all cigarettes, ashtrays, matches, and lighters in your home, car, or work. Do not let people smoke in your home.  Review your past attempts to quit. Think about what worked and what did not. GET SUPPORT AND ENCOURAGEMENT You have a better chance of being successful if you have help. You can get support in many ways.  Tell your family, friends, and coworkers that you are going to quit and need their support. Ask them not to smoke around you.  Get individual, group, or telephone counseling and support. Programs are available at local hospitals and health centers. Call your local health department for information about programs in your area.  Spiritual beliefs and practices may help some smokers quit.  Download a "quit meter" on your computer to keep track of quit statistics, such as how long you have gone without smoking, cigarettes not smoked, and money saved.  Get a self-help book about quitting smoking and staying off tobacco. LEARN NEW SKILLS AND BEHAVIORS  Distract yourself from urges to smoke. Talk to someone, go for a walk, or occupy your time with a task.  Change your normal routine. Take a different route to work. Drink tea  instead of coffee. Eat breakfast in a different place.  Reduce your stress. Take a hot bath, exercise, or read a book.  Plan something enjoyable to do every day. Reward yourself for not smoking.  Explore interactive web-based programs that specialize in helping you quit. GET MEDICINE AND USE IT CORRECTLY Medicines can help you stop smoking and decrease the urge to smoke. Combining medicine with the above behavioral methods and support can greatly increase your chances of successfully quitting smoking.  Nicotine replacement therapy helps deliver nicotine to your body without the negative effects and risks of smoking. Nicotine replacement therapy includes nicotine gum, lozenges, inhalers,   nasal sprays, and skin patches. Some may be available over-the-counter and others require a prescription.  Antidepressant medicine helps people abstain from smoking, but how this works is unknown. This medicine is available by prescription.  Nicotinic receptor partial agonist medicine simulates the effect of nicotine in your brain. This medicine is available by prescription. Ask your health care provider for advice about which medicines to use and how to use them based on your health history. Your health care provider will tell you what side effects to look out for if you choose to be on a medicine or therapy. Carefully read the information on the package. Do not use any other product containing nicotine while using a nicotine replacement product.  RELAPSE OR DIFFICULT SITUATIONS Most relapses occur within the first 3 months after quitting. Do not be discouraged if you start smoking again. Remember, most people try several times before finally quitting. You may have symptoms of withdrawal because your body is used to nicotine. You may crave cigarettes, be irritable, feel very hungry, cough often, get headaches, or have difficulty concentrating. The withdrawal symptoms are only temporary. They are strongest when you  first quit, but they will go away within 10-14 days. To reduce the chances of relapse, try to:  Avoid drinking alcohol. Drinking lowers your chances of successfully quitting.  Reduce the amount of caffeine you consume. Once you quit smoking, the amount of caffeine in your body increases and can give you symptoms, such as a rapid heartbeat, sweating, and anxiety.  Avoid smokers because they can make you want to smoke.  Do not let weight gain distract you. Many smokers will gain weight when they quit, usually less than 10 pounds. Eat a healthy diet and stay active. You can always lose the weight gained after you quit.  Find ways to improve your mood other than smoking. FOR MORE INFORMATION  www.smokefree.gov  Document Released: 02/11/2001 Document Revised: 07/04/2013 Document Reviewed: 05/29/2011 ExitCare Patient Information 2015 [employer]. This information is not intended to replace advice given to you by your health care provider. Make sure you discuss any questions you have with your health care provider.  

## 2013-10-04 NOTE — Progress Notes (Signed)
[facility] -PAD  History of Present Illness Brandy Stevenson is a 58 y.o. female patient of Dr. Kellie Simmering who returns for follow up of bilateral lower extremity pain. She denies pain at rest except at night when both legs are restless and have discomfort. She has no history of nonhealing ulcers or infection.   She does have a long history of tobacco abuse smoking one pack per day 40+ years, she states she is motivated to quit. The tired feeling in her legs starts in her thighs, travels distally to her calves, also described as heavy feeling in feet. She denies non healing wounds but does have dry skin cracking at backs of both heels. The dorsum of her right foot hurts constantly.   She has a history of left breast lumpectomy for breast cancer.  She denies any history of stroke of TIA, denies hx of MI or heart disease. She states she has back pain with bending over and the same tired feeling in legs with bending at the waist. She was under evaluation of her back which was halted when left breast cancer found in 2009. Her PCP is now [place].  Pt has not had previous peripheral vascular intervention.  The patient reports New Medical or Surgical History: started gabapentin which has helped pain in legs.  Pt Diabetic: No Pt smoker: smoker  (2/3 ppd x 40+ yrs)  Pt meds include: Statin :No, was told by her PCP that her cholesterol is fine ASA: Yes Other anticoagulants/antiplatelets: no  Past Medical History  Diagnosis Date  . Cancer   . Hypertension   . COPD (chronic obstructive pulmonary disease)   . Hyperlipemia     Social History History  Substance Use Topics  . Smoking status: Current Every Day Smoker -- 0.25 packs/day for 40 years    Types: Cigarettes  . Smokeless tobacco: Not on file  . Alcohol Use: No    Family History No family history on file.  Past Surgical History  Procedure Laterality Date  . Tubal  ligation    . Appendectomy    . Breast surgery    . Breast lumpectomy Left     s/p radiation therapy     Allergies  Allergen Reactions  . Vicodin [Hydrocodone-Acetaminophen] Nausea And Vomiting    Current Outpatient Prescriptions  Medication Sig Dispense Refill  . albuterol (PROVENTIL HFA;VENTOLIN HFA) 108 (90 BASE) MCG/ACT inhaler Inhale 2 puffs into the lungs every 6 (six) hours as needed for wheezing.  90 Inhaler  3  . aspirin 81 MG tablet Take 81 mg by mouth daily.      . Fluticasone-Salmeterol (ADVAIR) 100-50 MCG/DOSE AEPB Inhale 1 puff into the lungs 2 (two) times daily.  180 each  3  . gabapentin (NEURONTIN) 300 MG capsule Take 1 capsule (300 mg total) by mouth at bedtime.  90 capsule  0  . ibuprofen (ADVIL,MOTRIN) 200 MG tablet Take 800 mg by mouth every 8 (eight) hours as needed for pain.      [name] lisinopril-hydrochlorothiazide (PRINZIDE,ZESTORETIC) 20-12.5 MG per tablet Take 1 tablet by mouth daily.  30 tablet  1  . naproxen (NAPROSYN) 500 MG tablet Take 0.5 tablets (250 mg total) by mouth 2 (two) times daily as needed (for pain).  30 tablet  0  . potassium chloride (K-DUR) 10 MEQ tablet Take 1 tablet (10 mEq total) by mouth daily.  10 tablet  0  . ranitidine (ZANTAC) 150 MG capsule Take 1  capsule (150 mg total) by mouth 2 (two) times daily.  60 capsule  2  . Vitamin D, Ergocalciferol, (DRISDOL) 50000 UNITS CAPS capsule TAKE 1 CAPSULE BY MOUTH EVERY 7 DAYS.  12 capsule  0   No current facility-administered medications for this visit.    ROS: See HPI for pertinent positives and negatives.   Physical Examination  Filed Vitals:   10/04/13 1200  BP: 111/85  Pulse: 57  Resp: 16  Height: 5\' 4"  (1.626 m)  Weight: 151 lb (68.493 kg)  SpO2: 100%   Body mass index is 25.91 kg/(m^2).  General: A&O x 3, WDWN. Gait: normal Eyes: PERRLA. Pulmonary: CTAB, without wheezes , rales or rhonchi. Cardiac: regular Rythm , without detected murmur.         Carotid Bruits Right Left    Negative Negative  Aorta is not palpable. Radial pulses: 2+ palpable and =                           VASCULAR EXAM: Extremities without ischemic changes  without Gangrene; without open wounds. Dry cracking skin at posterior aspect of heels.                                                                                                          LE Pulses Right Left       FEMORAL  1+ palpable  1+ palpable        POPLITEAL  not palpable   not palpable       POSTERIOR TIBIAL  not palpable   not palpable        DORSALIS PEDIS      ANTERIOR TIBIAL 1+ palpable  not palpable    Abdomen: soft, NT, no masses. Skin: no rashes, no ulcers noted. See extremities. Musculoskeletal: no muscle wasting or atrophy.  Neurologic: A&O X 3; Appropriate Affect ; SENSATION: normal; MOTOR FUNCTION:  moving all extremities equally, motor strength 5/5 throughout in UE's, 3/5 in LE's. Speech is fluent/normal. CN 2-12 grossly intact. Positive straight leg raise test in both legs.    Non-Invasive Vascular Imaging: DATE: 10/04/2013 ABI: RIGHT 0.66, TBI: 0.36, Waveforms: monophasic;  LEFT does not correlate with the tibial artery waveforms, indicating the presence of medial calcification. Monophasic waveforms. Left TBI: 0.72. Previous (03/22/13) ABI's: Right: 0.75, LEFT: 0.94 (unreliable, monophasic waveforms bilaterally)   ASSESSMENT: Brandy Stevenson is a 59 y.o. female who presents with chronic intermittent claudication in legs, no non healing wounds, and 40+ years of tobacco use. Graduated walking program. Right lower extremity ABI indicates moderate arterial occlusive disease, slightly worse than six months prior at 0.75; left ABI is unreliable as the value does not correlate with monophasic waveforms indicating the presence of medial calcification. She has pain in her low back with straight leg raise test in both legs. She was under evaluation in 2009 for lumbar spine issues when this was halted when left  breast cancer was found for which lumpectomy was performed. The pain and tired feeling in her legs with  bending at the waist is not likely to be related to claudication, but she does have evidence of arterial occlusive disease in both legs with possible overlapping symptoms of lumbar spine problems. Will refer to neurosurgeon for evaluation. She is motivated to stop smoking and was given a pamphlet re ongoing free smoking cessation classes at [facility], states she will register.  Will evaluate the outcome of the conservative approach of smoking cessation attempt, maximal medical management, and graduated walking program when she returns in 6 months.   PLAN:  Referral to neurosurgeon, Dr. Erline Levine, for lumbar spine evaluation that was halted when left breast cancer found in 2009. Graduated walking program. She was counseled re smoking cessation. I discussed in depth with the patient the nature of atherosclerosis, and emphasized the importance of maximal medical management including strict control of blood pressure, blood glucose, and lipid levels, obtaining regular exercise, and cessation of smoking.  The patient is aware that without maximal medical management the underlying atherosclerotic disease process will progress, limiting the benefit of any interventions.  Based on the patient's vascular studies and examination, pt will return to clinic in 6 months for ABI's.  The patient was given information about PAD including signs, symptoms, treatment, what symptoms should prompt the patient to seek immediate medical care, and risk reduction measures to take.  Clemon Chambers, RN, MSN, FNP-C [facility] Phone: [phone]  Clinic MD: Kellie Simmering  10/04/2013 11:18 AM

## 2013-10-21 ENCOUNTER — Other Ambulatory Visit: Payer: Self-pay | Admitting: Internal Medicine

## 2013-10-26 ENCOUNTER — Telehealth: Payer: Self-pay | Admitting: Internal Medicine

## 2013-10-26 NOTE — Telephone Encounter (Signed)
Patient has called in to talk about a medical matter of her concern; patient did not disclose information about the matter, however preferred to have a nurse call her to clarify;

## 2013-10-27 ENCOUNTER — Encounter: Payer: Self-pay | Admitting: Emergency Medicine

## 2013-10-27 ENCOUNTER — Other Ambulatory Visit: Payer: Self-pay | Admitting: *Deleted

## 2013-10-27 DIAGNOSIS — R109 Unspecified abdominal pain: Secondary | ICD-10-CM

## 2013-10-27 NOTE — Patient Instructions (Signed)
Keep scheduled appointment for CT scan @ [facility] 10/28/13 and we will call you with results If symptoms continue with nausea,vomitting and increased abdominal pain go to ER

## 2013-10-27 NOTE — Progress Notes (Unsigned)
Patient ID: Brandy Stevenson, female   DOB: June 19, 1955, 58 y.o.   MRN: [id number] Pt comes in with c/o 3 dys right flank pain radiating to back with swelling. Denies n/v/d or fever 6/10 pain Urine Dip- Moderate blood Reviewed with Dr. Annitta Needs Pt scheduled for CT scan @ [facility] 10/28/13

## 2013-10-28 ENCOUNTER — Ambulatory Visit (HOSPITAL_COMMUNITY)
Admission: RE | Admit: 2013-10-28 | Discharge: 2013-10-28 | Disposition: A | Discharge status: Home / Self Care | Payer: Self-pay | Source: Ambulatory Visit | Attending: Internal Medicine | Admitting: Internal Medicine

## 2013-10-28 ENCOUNTER — Telehealth: Payer: Self-pay | Admitting: Internal Medicine

## 2013-10-28 DIAGNOSIS — R109 Unspecified abdominal pain: Secondary | ICD-10-CM | POA: Insufficient documentation

## 2013-10-28 DIAGNOSIS — I708 Atherosclerosis of other arteries: Secondary | ICD-10-CM | POA: Insufficient documentation

## 2013-10-28 DIAGNOSIS — R142 Eructation: Secondary | ICD-10-CM

## 2013-10-28 DIAGNOSIS — R141 Gas pain: Secondary | ICD-10-CM | POA: Insufficient documentation

## 2013-10-28 DIAGNOSIS — R319 Hematuria, unspecified: Secondary | ICD-10-CM | POA: Insufficient documentation

## 2013-10-28 DIAGNOSIS — R143 Flatulence: Secondary | ICD-10-CM

## 2013-10-28 DIAGNOSIS — R935 Abnormal findings on diagnostic imaging of other abdominal regions, including retroperitoneum: Secondary | ICD-10-CM | POA: Insufficient documentation

## 2013-10-28 DIAGNOSIS — Z87442 Personal history of urinary calculi: Secondary | ICD-10-CM | POA: Insufficient documentation

## 2013-10-28 DIAGNOSIS — Z9089 Acquired absence of other organs: Secondary | ICD-10-CM | POA: Insufficient documentation

## 2013-10-28 DIAGNOSIS — I700 Atherosclerosis of aorta: Secondary | ICD-10-CM | POA: Insufficient documentation

## 2013-10-28 NOTE — Telephone Encounter (Signed)
Patient calling for CT results. Informed patient results have not been read by MD when it is we will call her back. Vivia Birmingham, RN

## 2013-10-28 NOTE — Telephone Encounter (Signed)
Pt is in a lot of pain and calling for imaging results to see what treatment will be. Please f/u with pt.

## 2013-10-31 ENCOUNTER — Telehealth: Payer: Self-pay | Admitting: Internal Medicine

## 2013-10-31 NOTE — Telephone Encounter (Signed)
Pt. Called to check on status of CT scan results. Please f/u with pt.

## 2013-11-01 NOTE — Telephone Encounter (Signed)
Please see the results note

## 2013-11-01 NOTE — Telephone Encounter (Signed)
Pt requesting CT results. Please f/u

## 2013-11-02 ENCOUNTER — Telehealth: Payer: Self-pay | Admitting: Emergency Medicine

## 2013-11-02 DIAGNOSIS — F17200 Nicotine dependence, unspecified, uncomplicated: Secondary | ICD-10-CM

## 2013-11-02 MED ORDER — ATORVASTATIN CALCIUM 20 MG PO TABS: 20.0000 mg | ORAL_TABLET | Freq: Every day | ORAL | Status: DC

## 2013-11-02 NOTE — Telephone Encounter (Signed)
Pt given negative CT results and instructed she will need to start taking prescribed Lipitor 20 mg tab daily Medication e-scribed to [facility]

## 2013-11-28 ENCOUNTER — Encounter: Payer: Self-pay | Admitting: Internal Medicine

## 2013-11-28 ENCOUNTER — Ambulatory Visit: Payer: No Typology Code available for payment source | Attending: Internal Medicine | Admitting: Internal Medicine

## 2013-11-28 VITALS — BP 107/73 | HR 64 | Temp 98.0°F | Resp 16 | Wt 154.6 lb

## 2013-11-28 DIAGNOSIS — E785 Hyperlipidemia, unspecified: Secondary | ICD-10-CM | POA: Insufficient documentation

## 2013-11-28 DIAGNOSIS — J449 Chronic obstructive pulmonary disease, unspecified: Secondary | ICD-10-CM | POA: Insufficient documentation

## 2013-11-28 DIAGNOSIS — Z139 Encounter for screening, unspecified: Secondary | ICD-10-CM

## 2013-11-28 DIAGNOSIS — I10 Essential (primary) hypertension: Secondary | ICD-10-CM | POA: Insufficient documentation

## 2013-11-28 DIAGNOSIS — E876 Hypokalemia: Secondary | ICD-10-CM | POA: Insufficient documentation

## 2013-11-28 DIAGNOSIS — Z923 Personal history of irradiation: Secondary | ICD-10-CM | POA: Insufficient documentation

## 2013-11-28 DIAGNOSIS — J4489 Other specified chronic obstructive pulmonary disease: Secondary | ICD-10-CM | POA: Insufficient documentation

## 2013-11-28 DIAGNOSIS — Z9089 Acquired absence of other organs: Secondary | ICD-10-CM | POA: Insufficient documentation

## 2013-11-28 DIAGNOSIS — K219 Gastro-esophageal reflux disease without esophagitis: Secondary | ICD-10-CM | POA: Insufficient documentation

## 2013-11-28 DIAGNOSIS — F17200 Nicotine dependence, unspecified, uncomplicated: Secondary | ICD-10-CM | POA: Insufficient documentation

## 2013-11-28 DIAGNOSIS — Z7982 Long term (current) use of aspirin: Secondary | ICD-10-CM | POA: Insufficient documentation

## 2013-11-28 NOTE — Progress Notes (Signed)
MRN: [id number] Name: Brandy Stevenson  Sex: female Age: 58 y.o. DOB: 23-Sep-1955  Allergies: Vicodin  Chief Complaint  Patient presents with  . Follow-up    HPI: Patient is 58 y.o. female who has to of hypertension GERD hyperlipidemia COPD comes today for followup, she has been compliant in taking her medications her blood pressure is well controlled her she uses Advair as well as albuterol when necessary, patient is to smoke cigarettes, I have encouraged her to quit smoking, last blood work reviewed she had a low potassium as per patient she took the supplement. Patient has already been referred to GI for screening colonoscopy, she has not had any recent mammogram done  Past Medical History  Diagnosis Date  . Hypertension   . COPD (chronic obstructive pulmonary disease)   . Hyperlipemia   . Cancer 2009    Breast    Past Surgical History  Procedure Laterality Date  . Tubal ligation    . Appendectomy    . Breast surgery    . Breast lumpectomy Left     s/p radiation therapy       Medication List       This list is accurate as of: 11/28/13  9:29 AM.  Always use your most recent med list.               albuterol 108 (90 BASE) MCG/ACT inhaler  Commonly known as:  PROVENTIL HFA;VENTOLIN HFA  Inhale 2 puffs into the lungs every 6 (six) hours as needed for wheezing.     ALEVE 220 MG Caps  Generic drug:  Naproxen Sodium  Take by mouth as needed. FOR PAIN     aspirin 81 MG tablet  Take 81 mg by mouth daily.     atorvastatin 20 MG tablet  Commonly known as:  LIPITOR  Take 1 tablet (20 mg total) by mouth daily.     Fluticasone-Salmeterol 100-50 MCG/DOSE Aepb  Commonly known as:  ADVAIR  Inhale 1 puff into the lungs 2 (two) times daily.     gabapentin 300 MG capsule  Commonly known as:  NEURONTIN  Take 1 capsule (300 mg total) by mouth at bedtime.     ibuprofen 200 MG tablet  Commonly known as:  ADVIL,MOTRIN  Take 800 mg by mouth every 8 (eight) hours as  needed for pain.     lisinopril-hydrochlorothiazide 20-12.5 MG per tablet  Commonly known as:  PRINZIDE,ZESTORETIC  TAKE 1 TABLET BY MOUTH DAILY.     naproxen 500 MG tablet  Commonly known as:  NAPROSYN  Take 0.5 tablets (250 mg total) by mouth 2 (two) times daily as needed (for pain).     potassium chloride 10 MEQ tablet  Commonly known as:  K-DUR  Take 1 tablet (10 mEq total) by mouth daily.     ranitidine 150 MG capsule  Commonly known as:  ZANTAC  Take 1 capsule (150 mg total) by mouth 2 (two) times daily.     Vitamin D (Ergocalciferol) 50000 UNITS Caps capsule  Commonly known as:  DRISDOL  TAKE 1 CAPSULE BY MOUTH EVERY 7 DAYS.        No orders of the defined types were placed in this encounter.     There is no immunization history on file for this patient.  Family History  Problem Relation Age of Onset  . Hyperlipidemia Mother   . Hypertension Mother   . Hyperlipidemia Sister   . Hypertension Sister   . Heart disease  Sister     Before age 81,  CHF  . Heart attack Brother   . Hypertension Daughter     History  Substance Use Topics  . Smoking status: Light Tobacco Smoker -- 0.25 packs/day for 40 years    Types: Cigarettes  . Smokeless tobacco: Never Used  . Alcohol Use: No    Review of Systems   As noted in HPI  Filed Vitals:   11/28/13 0905  BP: 107/73  Pulse: 64  Temp: 98 F (36.7 C)  Resp: 16    Physical Exam  Physical Exam  Constitutional: No distress.  Eyes: EOM are normal. Pupils are equal, round, and reactive to light.  Cardiovascular: Normal rate and regular rhythm.   Pulmonary/Chest: Breath sounds normal. No respiratory distress. She has no wheezes. She has no rales.  Musculoskeletal: She exhibits no edema.    CBC    Component Value Date/Time   WBC 4.6 08/24/2013 1717   WBC 4.2 06/12/2008 0939   RBC 3.96 08/24/2013 1717   RBC 4.46 06/12/2008 0939   HGB 12.5 08/24/2013 1717   HGB 13.8 06/12/2008 0939   HCT 36.4 08/24/2013 1717    HCT 40.2 06/12/2008 0939   PLT 228 08/24/2013 1717   PLT 230 06/12/2008 0939   MCV 91.9 08/24/2013 1717   MCV 90.1 06/12/2008 0939   LYMPHSABS 1.7 08/24/2013 1717   LYMPHSABS 1.5 06/12/2008 0939   MONOABS 0.4 08/24/2013 1717   MONOABS 0.6 06/12/2008 0939   EOSABS 0.1 08/24/2013 1717   EOSABS 0.1 06/12/2008 0939   BASOSABS 0.0 08/24/2013 1717   BASOSABS 0.0 06/12/2008 0939    CMP     Component Value Date/Time   NA 141 08/26/2013 1129   K 3.3* 08/26/2013 1129   CL 103 08/26/2013 1129   CO2 29 08/26/2013 1129   GLUCOSE 84 08/26/2013 1129   BUN 14 08/26/2013 1129   CREATININE 0.89 08/26/2013 1129   CREATININE 0.82 08/24/2013 1717   CALCIUM 9.5 08/26/2013 1129   PROT 7.3 08/26/2013 1129   ALBUMIN 4.1 08/26/2013 1129   AST 24 08/26/2013 1129   ALT 26 08/26/2013 1129   ALKPHOS 66 08/26/2013 1129   BILITOT 0.2 08/26/2013 1129   GFRNONAA 73 08/26/2013 1129   GFRNONAA 79* 08/24/2013 1717   GFRAA 84 08/26/2013 1129   GFRAA >90 08/24/2013 1717    Lab Results  Component Value Date/Time   CHOL 220* 02/23/2013  1:00 PM    No components found with this basename: hga1c    Lab Results  Component Value Date/Time   AST 24 08/26/2013 11:29 AM    Assessment and Plan  Essential hypertension, benign - Plan: Blood pressure is well-controlled continued current meds repeat her COMPLETE METABOLIC PANEL WITH GFR  Gastroesophageal reflux disease without esophagitis As modification currently on Zantac.  Other and unspecified hyperlipidemia - Plan: Will repeat fasting Lipid panel  Hypokalemia - Plan: COMPLETE METABOLIC PANEL WITH GFR  Screening - Plan: MM DIGITAL SCREENING BILATERAL   Health Maintenance -Colonoscopy: already referred to GI  -Mammogram: ordered  -Vaccinations:  Patient declined flu shot  Return in about 3 months (around 02/27/2014) for hypertension, hyperipidemia.  Lorayne Marek, MD

## 2013-11-28 NOTE — Patient Instructions (Signed)
Smoking Cessation Quitting smoking is important to your health and has many advantages. However, it is not always easy to quit since nicotine is a very addictive drug. Oftentimes, people try 3 times or more before being able to quit. This document explains the best ways for you to prepare to quit smoking. Quitting takes hard work and a lot of effort, but you can do it. ADVANTAGES OF QUITTING SMOKING  You will live longer, feel better, and live better.  Your body will feel the impact of quitting smoking almost immediately.  Within 20 minutes, blood pressure decreases. Your pulse returns to its normal level.  After 8 hours, carbon monoxide levels in the blood return to normal. Your oxygen level increases.  After 24 hours, the chance of having a heart attack starts to decrease. Your breath, hair, and body stop smelling like smoke.  After 48 hours, damaged nerve endings begin to recover. Your sense of taste and smell improve.  After 72 hours, the body is virtually free of nicotine. Your bronchial tubes relax and breathing becomes easier.  After 2 to 12 weeks, lungs can hold more air. Exercise becomes easier and circulation improves.  The risk of having a heart attack, stroke, cancer, or lung disease is greatly reduced.  After 1 year, the risk of coronary heart disease is cut in half.  After 5 years, the risk of stroke falls to the same as a nonsmoker.  After 10 years, the risk of lung cancer is cut in half and the risk of other cancers decreases significantly.  After 15 years, the risk of coronary heart disease drops, usually to the level of a nonsmoker.  If you are pregnant, quitting smoking will improve your chances of having a healthy baby.  The people you live with, especially any children, will be healthier.  You will have extra money to spend on things other than cigarettes. QUESTIONS TO THINK ABOUT BEFORE ATTEMPTING TO QUIT You may want to talk about your answers with your  health care provider.  Why do you want to quit?  If you tried to quit in the past, what helped and what did not?  What will be the most difficult situations for you after you quit? How will you plan to handle them?  Who can help you through the tough times? Your family? Friends? A health care provider?  What pleasures do you get from smoking? What ways can you still get pleasure if you quit? Here are some questions to ask your health care provider:  How can you help me to be successful at quitting?  What medicine do you think would be best for me and how should I take it?  What should I do if I need more help?  What is smoking withdrawal like? How can I get information on withdrawal? GET READY  Set a quit date.  Change your environment by getting rid of all cigarettes, ashtrays, matches, and lighters in your home, car, or work. Do not let people smoke in your home.  Review your past attempts to quit. Think about what worked and what did not. GET SUPPORT AND ENCOURAGEMENT You have a better chance of being successful if you have help. You can get support in many ways.  Tell your family, friends, and coworkers that you are going to quit and need their support. Ask them not to smoke around you.  Get individual, group, or telephone counseling and support. Programs are available at local hospitals and health centers. Call   your local health department for information about programs in your area.  Spiritual beliefs and practices may help some smokers quit.  Download a "quit meter" on your computer to keep track of quit statistics, such as how long you have gone without smoking, cigarettes not smoked, and money saved.  Get a self-help book about quitting smoking and staying off tobacco. LEARN NEW SKILLS AND BEHAVIORS  Distract yourself from urges to smoke. Talk to someone, go for a walk, or occupy your time with a task.  Change your normal routine. Take a different route to work.  Drink tea instead of coffee. Eat breakfast in a different place.  Reduce your stress. Take a hot bath, exercise, or read a book.  Plan something enjoyable to do every day. Reward yourself for not smoking.  Explore interactive web-based programs that specialize in helping you quit. GET MEDICINE AND USE IT CORRECTLY Medicines can help you stop smoking and decrease the urge to smoke. Combining medicine with the above behavioral methods and support can greatly increase your chances of successfully quitting smoking.  Nicotine replacement therapy helps deliver nicotine to your body without the negative effects and risks of smoking. Nicotine replacement therapy includes nicotine gum, lozenges, inhalers, nasal sprays, and skin patches. Some may be available over-the-counter and others require a prescription.  Antidepressant medicine helps people abstain from smoking, but how this works is unknown. This medicine is available by prescription.  Nicotinic receptor partial agonist medicine simulates the effect of nicotine in your brain. This medicine is available by prescription. Ask your health care provider for advice about which medicines to use and how to use them based on your health history. Your health care provider will tell you what side effects to look out for if you choose to be on a medicine or therapy. Carefully read the information on the package. Do not use any other product containing nicotine while using a nicotine replacement product.  RELAPSE OR DIFFICULT SITUATIONS Most relapses occur within the first 3 months after quitting. Do not be discouraged if you start smoking again. Remember, most people try several times before finally quitting. You may have symptoms of withdrawal because your body is used to nicotine. You may crave cigarettes, be irritable, feel very hungry, cough often, get headaches, or have difficulty concentrating. The withdrawal symptoms are only temporary. They are strongest  when you first quit, but they will go away within 10-14 days. To reduce the chances of relapse, try to:  Avoid drinking alcohol. Drinking lowers your chances of successfully quitting.  Reduce the amount of caffeine you consume. Once you quit smoking, the amount of caffeine in your body increases and can give you symptoms, such as a rapid heartbeat, sweating, and anxiety.  Avoid smokers because they can make you want to smoke.  Do not let weight gain distract you. Many smokers will gain weight when they quit, usually less than 10 pounds. Eat a healthy diet and stay active. You can always lose the weight gained after you quit.  Find ways to improve your mood other than smoking. FOR MORE INFORMATION  www.smokefree.gov  Document Released: 02/11/2001 Document Revised: 07/04/2013 Document Reviewed: 05/29/2011 [employer] Patient Information 2015 [employer]. This information is not intended to replace advice given to you by your health care provider. Make sure you discuss any questions you have with your health care provider.  

## 2013-11-28 NOTE — Progress Notes (Signed)
Patient here for follow up on her HTN and cholesterol

## 2013-11-29 LAB — LIPID PANEL
Cholesterol: 146 mg/dL (ref 0–200)
HDL: 55 mg/dL (ref >39)
LDL Cholesterol: 75 mg/dL (ref 0–99)
Total CHOL/HDL Ratio: 2.7 Ratio
Triglycerides: 78 mg/dL (ref <150)
VLDL: 16 mg/dL (ref 0–40)

## 2013-11-29 LAB — COMPLETE METABOLIC PANEL WITH GFR
ALT: 17 U/L (ref 0–35)
AST: 21 U/L (ref 0–37)
Albumin: 4.2 g/dL (ref 3.5–5.2)
Alkaline Phosphatase: 61 U/L (ref 39–117)
BUN: 17 mg/dL (ref 6–23)
CO2: 29 mEq/L (ref 19–32)
Calcium: 9.5 mg/dL (ref 8.4–10.5)
Chloride: 103 mEq/L (ref 96–112)
Creat: 0.95 mg/dL (ref 0.50–1.10)
GFR, Est African American: 77 mL/min
GFR, Est Non African American: 67 mL/min
Glucose, Bld: 83 mg/dL (ref 70–99)
Potassium: 4.5 mEq/L (ref 3.5–5.3)
Sodium: 141 mEq/L (ref 135–145)
Total Bilirubin: 0.3 mg/dL (ref 0.2–1.2)
Total Protein: 7.4 g/dL (ref 6.0–8.3)

## 2013-12-01 ENCOUNTER — Telehealth: Payer: Self-pay | Admitting: *Deleted

## 2013-12-01 NOTE — Telephone Encounter (Signed)
Pt is aware of her lab results.  

## 2013-12-01 NOTE — Telephone Encounter (Signed)
Message copied by Joan Mayans on Thu Dec 01, 2013  9:23 AM ------      Message from: Lorayne Marek      Created: Tue Nov 29, 2013 12:16 PM       Call and let the Patient know that blood work is normal.       ------

## 2013-12-19 ENCOUNTER — Other Ambulatory Visit: Payer: Self-pay | Admitting: Internal Medicine

## 2013-12-22 ENCOUNTER — Other Ambulatory Visit: Payer: Self-pay

## 2013-12-22 DIAGNOSIS — M79604 Pain in right leg: Secondary | ICD-10-CM

## 2013-12-22 DIAGNOSIS — M79605 Pain in left leg: Principal | ICD-10-CM

## 2013-12-22 MED ORDER — NAPROXEN 500 MG PO TABS: 250.0000 mg | ORAL_TABLET | Freq: Two times a day (BID) | ORAL | Status: DC | PRN

## 2013-12-22 MED ORDER — RANITIDINE HCL 150 MG PO CAPS: 150.0000 mg | ORAL_CAPSULE | Freq: Two times a day (BID) | ORAL | Status: DC

## 2013-12-28 ENCOUNTER — Other Ambulatory Visit: Payer: Self-pay | Admitting: Internal Medicine

## 2014-01-31 ENCOUNTER — Other Ambulatory Visit: Payer: Self-pay | Admitting: Internal Medicine

## 2014-03-02 ENCOUNTER — Other Ambulatory Visit: Payer: Self-pay | Admitting: Internal Medicine

## 2014-03-09 ENCOUNTER — Telehealth: Payer: Self-pay | Admitting: Internal Medicine

## 2014-03-09 NOTE — Telephone Encounter (Signed)
Pt stated at this time do not have HA Been out of HTN medication x 3 day, and taking them now. Advice to walk in tomorrow, BP check If symptoms worsen go to urgent care or ER

## 2014-03-09 NOTE — Telephone Encounter (Signed)
Patient called stating that she has a really bad headache on Monday and since then every time she exerts herself the headache comes back. Patient would like to speak to nurse. Please f/u with pt.

## 2014-03-10 ENCOUNTER — Other Ambulatory Visit: Payer: Self-pay | Admitting: Emergency Medicine

## 2014-03-10 ENCOUNTER — Emergency Department (HOSPITAL_COMMUNITY)
Admission: EM | Admit: 2014-03-10 | Discharge: 2014-03-11 | Disposition: A | Discharge status: Home / Self Care | Payer: No Typology Code available for payment source | Attending: Emergency Medicine | Admitting: Emergency Medicine

## 2014-03-10 DIAGNOSIS — I10 Essential (primary) hypertension: Secondary | ICD-10-CM | POA: Insufficient documentation

## 2014-03-10 DIAGNOSIS — Z720 Tobacco use: Secondary | ICD-10-CM | POA: Insufficient documentation

## 2014-03-10 DIAGNOSIS — E785 Hyperlipidemia, unspecified: Secondary | ICD-10-CM | POA: Insufficient documentation

## 2014-03-10 DIAGNOSIS — Z79899 Other long term (current) drug therapy: Secondary | ICD-10-CM | POA: Insufficient documentation

## 2014-03-10 DIAGNOSIS — J449 Chronic obstructive pulmonary disease, unspecified: Secondary | ICD-10-CM | POA: Insufficient documentation

## 2014-03-10 DIAGNOSIS — Z7982 Long term (current) use of aspirin: Secondary | ICD-10-CM | POA: Insufficient documentation

## 2014-03-10 DIAGNOSIS — Z7951 Long term (current) use of inhaled steroids: Secondary | ICD-10-CM | POA: Insufficient documentation

## 2014-03-10 DIAGNOSIS — R51 Headache: Secondary | ICD-10-CM | POA: Insufficient documentation

## 2014-03-10 DIAGNOSIS — Z853 Personal history of malignant neoplasm of breast: Secondary | ICD-10-CM | POA: Insufficient documentation

## 2014-03-10 DIAGNOSIS — R519 Headache, unspecified: Secondary | ICD-10-CM

## 2014-03-10 MED ORDER — NAPROXEN 500 MG PO TABS: 500.0000 mg | ORAL_TABLET | Freq: Two times a day (BID) | ORAL | Status: DC | PRN

## 2014-03-10 NOTE — ED Notes (Signed)
Bed: [facility] Expected date:  Expected time:  Means of arrival:  Comments: EMS 59yo F HTN

## 2014-03-10 NOTE — ED Notes (Addendum)
Pt transported from home with c/o sudden onset back and L side of head, pt states she had similar HA Monday which resolved. A & O, neuro intact. Pt is hypertensive.  Pt states she was out of her antihypertensives last week for 3 days, has been taking them as directed this week. C/o HA Monday that resolved, 40min to 1 hour ago began having severe HA to back of head radiating around head. Pt also states she is having a difficult time swallowing but this is not new, she states this usually clears with use of MDI.

## 2014-03-11 ENCOUNTER — Encounter (HOSPITAL_COMMUNITY): Payer: Self-pay | Admitting: Emergency Medicine

## 2014-03-11 ENCOUNTER — Emergency Department (HOSPITAL_COMMUNITY): Payer: No Typology Code available for payment source

## 2014-03-11 MED ORDER — NAPROXEN 500 MG PO TABS: 500.0000 mg | ORAL_TABLET | Freq: Two times a day (BID) | ORAL | Status: DC

## 2014-03-11 MED ORDER — OXYCODONE-ACETAMINOPHEN 5-325 MG PO TABS
1.0000 | ORAL_TABLET | Freq: Once | ORAL | Status: AC
  Administered 2014-03-11: 1 via ORAL
  Filled 2014-03-11: qty 1

## 2014-03-11 NOTE — ED Provider Notes (Signed)
CSN: [id number]     Arrival date & time 03/10/14  2353 History   First MD Initiated Contact with Patient 03/11/14 [phone]     Chief Complaint  Patient presents with  . Headache      HPI Patient presents with headache in the posterior aspect of her head over the past 3 days.  She previously had migraine headaches but has not had these in many years.  Her headache had somewhat resolved over the past couple days but then returned again just prior to arrival in the emergency department.  She now states the pain radiates from her right posterior scalp around to the right side of her head.  No photophobia or phonophobia.  She has not tried any medication prior to arrival.  Denies nausea vomiting.  No weakness of her arms or legs.  No speech difficulties.  Pain is mild to moderate in severity.  Nothing worsens or improves her pain   Past Medical History  Diagnosis Date  . Hypertension   . COPD (chronic obstructive pulmonary disease)   . Hyperlipemia   . Cancer 2009    Breast   Past Surgical History  Procedure Laterality Date  . Tubal ligation    . Appendectomy    . Breast surgery    . Breast lumpectomy Left     s/p radiation therapy    Family History  Problem Relation Age of Onset  . Hyperlipidemia Mother   . Hypertension Mother   . Hyperlipidemia Sister   . Hypertension Sister   . Heart disease Sister     Before age 19,  CHF  . Heart attack Brother   . Hypertension Daughter    History  Substance Use Topics  . Smoking status: Light Tobacco Smoker -- 0.25 packs/day for 40 years    Types: Cigarettes  . Smokeless tobacco: Never Used  . Alcohol Use: No   OB History    No data available     Review of Systems  All other systems reviewed and are negative.     Allergies  Vicodin  Home Medications   Prior to Admission medications   Medication Sig Start Date End Date Taking? Authorizing Provider  albuterol (PROVENTIL HFA;VENTOLIN HFA) 108 (90 BASE) MCG/ACT inhaler Inhale 2  puffs into the lungs every 6 (six) hours as needed for wheezing. 04/08/13  Yes Lorayne Marek, MD  aspirin 81 MG tablet Take 81 mg by mouth daily.   Yes Historical Provider, MD  atorvastatin (LIPITOR) 20 MG tablet Take 1 tablet (20 mg total) by mouth daily. 11/02/13  Yes Deepak Advani, MD  Fluticasone-Salmeterol (ADVAIR) 100-50 MCG/DOSE AEPB Inhale 1 puff into the lungs 2 (two) times daily. 04/08/13  Yes Lorayne Marek, MD  gabapentin (NEURONTIN) 300 MG capsule TAKE ONE CAPSULE AT BEDTIME 03/08/14  Yes Deepak Advani, MD  lisinopril-hydrochlorothiazide (PRINZIDE,ZESTORETIC) 20-12.5 MG per tablet TAKE 1 TABLET BY MOUTH DAILY. 12/20/13  Yes Tresa Garter, MD  Naproxen Sodium (ALEVE) 220 MG CAPS Take by mouth as needed. FOR PAIN   Yes Historical Provider, MD  potassium chloride (K-DUR) 10 MEQ tablet Take 1 tablet (10 mEq total) by mouth daily. Patient taking differently: Take 10 mEq by mouth daily as needed. For low potassium 08/30/13  Yes Deepak Advani, MD  ranitidine (ZANTAC) 150 MG capsule Take 1 capsule (150 mg total) by mouth 2 (two) times daily. 12/22/13  Yes Lorayne Marek, MD  Vitamin D, Ergocalciferol, (DRISDOL) 50000 UNITS CAPS capsule TAKE 1 CAPSULE BY MOUTH EVERY 7  DAYS.   Yes Deepak Advani, MD  ibuprofen (ADVIL,MOTRIN) 200 MG tablet Take 800 mg by mouth every 8 (eight) hours as needed for pain.    Historical Provider, MD  naproxen (NAPROSYN) 500 MG tablet Take 1 tablet (500 mg total) by mouth 2 (two) times daily. 03/11/14   Hoy Morn, MD   BP 140/93 mmHg  Pulse 59  Temp(Src) 97.6 F (36.4 C) (Oral)  Resp 20  Wt 150 lb (68.04 kg)  SpO2 96% Physical Exam  Constitutional: She is oriented to person, place, and time. She appears well-developed and well-nourished. No distress.  HENT:  Head: Normocephalic and atraumatic.  Eyes: EOM are normal.  Neck: Normal range of motion.  Cardiovascular: Normal rate, regular rhythm and normal heart sounds.   Pulmonary/Chest: Effort normal and breath  sounds normal.  Abdominal: Soft. She exhibits no distension.  Musculoskeletal: Normal range of motion.  Neurological: She is alert and oriented to person, place, and time.  Skin: Skin is warm and dry.  Psychiatric: She has a normal mood and affect. Judgment normal.  Nursing note and vitals reviewed.   ED Course  Procedures (including critical care time) Labs Review Labs Reviewed - No data to display  Imaging Review Ct Head Wo Contrast  03/11/2014   CLINICAL DATA:  New onset headache tonight. History of hypertension. Headaches on the back and left-sided head. Similar headache on Monday which resolved. Difficulty swallowing.  EXAM: CT HEAD WITHOUT CONTRAST  TECHNIQUE: Contiguous axial images were obtained from the base of the skull through the vertex without intravenous contrast.  COMPARISON:  None.  FINDINGS: Ventricles and sulci appear symmetrical. No mass effect or midline shift. No abnormal extra-axial fluid collections. Gray-white matter junctions are distinct. Basal cisterns are not effaced. No evidence of acute intracranial hemorrhage. No depressed skull fractures. Visualized paranasal sinuses and mastoid air cells are not opacified.  IMPRESSION: No acute intracranial abnormalities.  Normal examination.   Electronically Signed   By: Lucienne Capers M.D.   On: 03/11/2014 01:11  I personally reviewed the imaging tests through PACS system I reviewed available ER/hospitalization records through the EMR    EKG Interpretation None      MDM   Final diagnoses:  Headache, unspecified headache type   3:28 AM Patient is feeling better this time.  Nonspecific headache.  CT ordered prior to my arrival but without significant abnormality.  Doubt subarachnoid hemorrhage.    Hoy Morn, MD 03/11/14 [phone]

## 2014-03-11 NOTE — Discharge Instructions (Signed)

## 2014-03-30 ENCOUNTER — Ambulatory Visit: Payer: No Typology Code available for payment source | Admitting: Internal Medicine

## 2014-04-03 ENCOUNTER — Encounter: Payer: Self-pay | Admitting: Family

## 2014-04-04 ENCOUNTER — Ambulatory Visit (INDEPENDENT_AMBULATORY_CARE_PROVIDER_SITE_OTHER): Payer: Self-pay | Admitting: Family

## 2014-04-04 ENCOUNTER — Encounter: Payer: Self-pay | Admitting: Family

## 2014-04-04 ENCOUNTER — Ambulatory Visit (HOSPITAL_COMMUNITY)
Admission: RE | Admit: 2014-04-04 | Discharge: 2014-04-04 | Disposition: A | Discharge status: Home / Self Care | Payer: No Typology Code available for payment source | Source: Ambulatory Visit | Attending: Family | Admitting: Family

## 2014-04-04 VITALS — BP 128/88 | HR 53 | Resp 16 | Ht 65.0 in | Wt 156.0 lb

## 2014-04-04 DIAGNOSIS — Z87891 Personal history of nicotine dependence: Secondary | ICD-10-CM

## 2014-04-04 DIAGNOSIS — M629 Disorder of muscle, unspecified: Secondary | ICD-10-CM | POA: Insufficient documentation

## 2014-04-04 DIAGNOSIS — I739 Peripheral vascular disease, unspecified: Secondary | ICD-10-CM

## 2014-04-04 DIAGNOSIS — I70219 Atherosclerosis of native arteries of extremities with intermittent claudication, unspecified extremity: Secondary | ICD-10-CM

## 2014-04-04 NOTE — Patient Instructions (Signed)
Peripheral Vascular Disease Peripheral Vascular Disease (PVD), also called Peripheral Arterial Disease (PAD), is a circulation problem caused by cholesterol (atherosclerotic plaque) deposits in the arteries. PVD commonly occurs in the lower extremities (legs) but it can occur in other areas of the body, such as your arms. The cholesterol buildup in the arteries reduces blood flow which can cause pain and other serious problems. The presence of PVD can place a person at risk for Coronary Artery Disease (CAD).  CAUSES  Causes of PVD can be many. It is usually associated with more than one risk factor such as:   High Cholesterol.  Smoking.  Diabetes.  Lack of exercise or inactivity.  High blood pressure (hypertension).  Obesity.  Family history. SYMPTOMS   When the lower extremities are affected, patients with PVD may experience:  Leg pain with exertion or physical activity. This is called INTERMITTENT CLAUDICATION. This may present as cramping or numbness with physical activity. The location of the pain is associated with the level of blockage. For example, blockage at the abdominal level (distal abdominal aorta) may result in buttock or hip pain. Lower leg arterial blockage may result in calf pain.  As PVD becomes more severe, pain can develop with less physical activity.  In people with severe PVD, leg pain may occur at rest.  Other PVD signs and symptoms:  Leg numbness or weakness.  Coldness in the affected leg or foot, especially when compared to the other leg.  A change in leg color.  Patients with significant PVD are more prone to ulcers or sores on toes, feet or legs. These may take longer to heal or may reoccur. The ulcers or sores can become infected.  If signs and symptoms of PVD are ignored, gangrene may occur. This can result in the loss of toes or loss of an entire limb.  Not all leg pain is related to PVD. Other medical conditions can cause leg pain such  as:  Blood clots (embolism) or Deep Vein Thrombosis.  Inflammation of the blood vessels (vasculitis).  Spinal stenosis. DIAGNOSIS  Diagnosis of PVD can involve several different types of tests. These can include:  Pulse Volume Recording Method (PVR). This test is simple, painless and does not involve the use of X-rays. PVR involves measuring and comparing the blood pressure in the arms and legs. An ABI (Ankle-Brachial Index) is calculated. The normal ratio of blood pressures is 1. As this number becomes smaller, it indicates more severe disease.  < 0.95 - indicates significant narrowing in one or more leg vessels.  <0.8 - there will usually be pain in the foot, leg or buttock with exercise.  <0.4 - will usually have pain in the legs at rest.  <0.25 - usually indicates limb threatening PVD.  Doppler detection of pulses in the legs. This test is painless and checks to see if you have a pulses in your legs/feet.  A dye or contrast material (a substance that highlights the blood vessels so they show up on x-ray) may be given to help your caregiver better see the arteries for the following tests. The dye is eliminated from your body by the kidney's. Your caregiver may order blood work to check your kidney function and other laboratory values before the following tests are performed:  Magnetic Resonance Angiography (MRA). An MRA is a picture study of the blood vessels and arteries. The MRA machine uses a large magnet to produce images of the blood vessels.  Computed Tomography Angiography (CTA). A CTA   is a specialized x-ray that looks at how the blood flows in your blood vessels. An IV may be inserted into your arm so contrast dye can be injected.  Angiogram. Is a procedure that uses x-rays to look at your blood vessels. This procedure is minimally invasive, meaning a small incision (cut) is made in your groin. A small tube (catheter) is then inserted into the artery of your groin. The catheter  is guided to the blood vessel or artery your caregiver wants to examine. Contrast dye is injected into the catheter. X-rays are then taken of the blood vessel or artery. After the images are obtained, the catheter is taken out. TREATMENT  Treatment of PVD involves many interventions which may include:  Lifestyle changes:  Quitting smoking.  Exercise.  Following a low fat, low cholesterol diet.  Control of diabetes.  Foot care is very important to the PVD patient. Good foot care can help prevent infection.  Medication:  Cholesterol-lowering medicine.  Blood pressure medicine.  Anti-platelet drugs.  Certain medicines may reduce symptoms of Intermittent Claudication.  Interventional/Surgical options:  Angioplasty. An Angioplasty is a procedure that inflates a balloon in the blocked artery. This opens the blocked artery to improve blood flow.  Stent Implant. A wire mesh tube (stent) is placed in the artery. The stent expands and stays in place, allowing the artery to remain open.  Peripheral Bypass Surgery. This is a surgical procedure that reroutes the blood around a blocked artery to help improve blood flow. This type of procedure may be performed if Angioplasty or stent implants are not an option. SEEK IMMEDIATE MEDICAL CARE IF:   You develop pain or numbness in your arms or legs.  Your arm or leg turns cold, becomes blue in color.  You develop redness, warmth, swelling and pain in your arms or legs. MAKE SURE YOU:   Understand these instructions.  Will watch your condition.  Will get help right away if you are not doing well or get worse. Document Released: 03/27/2004 Document Revised: 05/12/2011 Document Reviewed: 02/22/2008 [facility] Patient Information 2015 [place], [place]. This information is not intended to replace advice given to you by your health care provider. Make sure you discuss any questions you have with your health care provider.   For cracked skin at  heels: try A&D ointment, also take a daily multivitamin with minerals.  For leg cramps at night: take 400 mg magnesium oxide at bedtime and a teaspoon of mustard by mouth at bedtime.

## 2014-04-04 NOTE — Progress Notes (Signed)
[facility] [place] HISTORY AND PHYSICAL -PAD  History of Present Illness Brandy Stevenson is a 59 y.o. female patient of Dr. Kellie Simmering who returns for follow up of bilateral lower extremity PAD and known iliac artery stenosis.  Pt has not had previous peripheral vascular intervention.  She has no history of nonhealing ulcers or infection.   The tired feeling in her legs starts in her thighs, travels distally to her calves, also described as heavy feeling in feet. She denies non healing wounds but does have dry skin cracking at backs of both heels. The dorsum of her right foot hurts constantly.   She has a history of left breast lumpectomy for breast cancer.  She denies any history of stroke or TIA, denies hx of MI or heart disease. She states she has back pain with bending over and the same tired feeling in legs with bending at the waist. She was under evaluation of her back which was halted when left breast cancer found in 2009. Her PCP is now [place].  The patient reports New Medical or Surgical History: started gabapentin which has helped pain in legs.  She could not afford the co-pay to see Dr. Vertell Limber, neurosurgeon to whom she was referred for evaluation of known back problems.   She is attending classes online.   Pt Diabetic: No Pt smoker: former smoker, stopped January 2016, smoked 40+ yrs)  Pt meds include: Statin :yes ASA: Yes Other anticoagulants/antiplatelets: no   Past Medical History  Diagnosis Date  . Hypertension   . COPD (chronic obstructive pulmonary disease)   . Hyperlipemia   . Cancer 2009    Breast    Social History History  Substance Use Topics  . Smoking status: Former Smoker -- 0.25 packs/day for 40 years    Types: Cigarettes    Quit date: 03/04/2014  . Smokeless tobacco: Never Used  . Alcohol Use: No    Family History Family History  Problem Relation Age of Onset  . Hyperlipidemia Mother   . Hypertension  Mother   . Heart disease Mother     Atrial Fib.  . Hyperlipidemia Sister   . Hypertension Sister   . Heart disease Sister     Before age 109,  CHF  . Heart attack Brother   . Hyperlipidemia Brother   . Hypertension Brother   . Hypertension Daughter     Past Surgical History  Procedure Laterality Date  . Tubal ligation  1991  . Breast surgery    . Breast lumpectomy Left     s/p radiation therapy   . Appendectomy  1980    Allergies  Allergen Reactions  . Vicodin [Hydrocodone-Acetaminophen] Nausea And Vomiting and Other (See Comments)    Sweating    Current Outpatient Prescriptions  Medication Sig Dispense Refill  . albuterol (PROVENTIL HFA;VENTOLIN HFA) 108 (90 BASE) MCG/ACT inhaler Inhale 2 puffs into the lungs every 6 (six) hours as needed for wheezing. 90 Inhaler 3  . aspirin 81 MG tablet Take 81 mg by mouth daily.    [name] atorvastatin (LIPITOR) 20 MG tablet Take 1 tablet (20 mg total) by mouth daily. 90 tablet 3  . Fluticasone-Salmeterol (ADVAIR) 100-50 MCG/DOSE AEPB Inhale 1 puff into the lungs 2 (two) times daily. 180 each 3  . gabapentin (NEURONTIN) 300 MG capsule TAKE ONE CAPSULE AT BEDTIME 90 capsule 0  . lisinopril-hydrochlorothiazide (PRINZIDE,ZESTORETIC) 20-12.5 MG per tablet TAKE 1 TABLET BY MOUTH DAILY. 30 tablet 1  . naproxen (  NAPROSYN) 500 MG tablet Take 1 tablet (500 mg total) by mouth 2 (two) times daily. 10 tablet 0  . Naproxen Sodium (ALEVE) 220 MG CAPS Take by mouth as needed. FOR PAIN    . ranitidine (ZANTAC) 150 MG capsule Take 1 capsule (150 mg total) by mouth 2 (two) times daily. 60 capsule 2  . Vitamin D, Ergocalciferol, (DRISDOL) 50000 UNITS CAPS capsule TAKE 1 CAPSULE BY MOUTH EVERY 7 DAYS. 12 capsule 0  . ibuprofen (ADVIL,MOTRIN) 200 MG tablet Take 800 mg by mouth every 8 (eight) hours as needed for pain.    . potassium chloride (K-DUR) 10 MEQ tablet Take 1 tablet (10 mEq total) by mouth daily. (Patient not taking: Reported on 04/04/2014) 10 tablet 0    No current facility-administered medications for this visit.    ROS: See HPI for pertinent positives and negatives.   Physical Examination  Filed Vitals:   04/04/14 1148  BP: 128/88  Pulse: 53  Resp: 16  Height: 5\' 5"  (1.651 m)  Weight: 156 lb (70.761 kg)  SpO2: 100%   Body mass index is 25.96 kg/(m^2).  General: A&O x 3, WDWN. Gait: normal Eyes: PERRLA. Pulmonary: CTAB, without wheezes , rales or rhonchi. Cardiac: regular Rythm , without detected murmur.     Carotid Bruits Right Left   Negative Negative  Aorta is not palpable. Radial pulses: 2+ palpable and =   VASCULAR EXAM: Extremities without ischemic changes  without Gangrene; without open wounds. Dry cracking skin at posterior aspect of heels.     LE Pulses Right Left   FEMORAL Not  palpable Not palpable    POPLITEAL not palpable  not palpable   POSTERIOR TIBIAL not palpable  not palpable    DORSALIS PEDIS  ANTERIOR TIBIAL not palpable  not palpable    Abdomen: soft, NT, no palpable masses. Skin: no rashes, no ulcers. See extremities. Musculoskeletal: no muscle wasting or atrophy. Neurologic: A&O X 3; Appropriate Affect ; SENSATION: normal; MOTOR FUNCTION: moving all extremities equally, motor strength 5/5 throughout in UE's, 3/5 in LE's. Speech is fluent/normal. CN 2-12 grossly intact. Positive straight leg raise test in both legs.    Non-Invasive Vascular Imaging: DATE: 04/04/2014 ABI: RIGHT 0.72 (10/04/13, 0.66), Waveforms: monophasic;  LEFT 0.82 (did not correlate with monophasic waveform), Waveforms: monophasic   ASSESSMENT: Brandy Stevenson is a 59 y.o. female who presents with PAD, known iliac artery disease, no LE tissue loss.  She stopped smoking last month, 40+ years  smoking hx. She does no have DM. She is trying to maintain a graduated walking program. ABI's remain stable, monophasic waveforms six months ago and now.  PLAN:  Continue smoking cessation and graduated walking program. I discussed in depth with the patient the nature of atherosclerosis, and emphasized the importance of maximal medical management including strict control of blood pressure, blood glucose, and lipid levels, obtaining regular exercise, and continued cessation of smoking.  The patient is aware that without maximal medical management the underlying atherosclerotic disease process will progress, limiting the benefit of any interventions.  Based on the patient's vascular studies and examination, pt will return to clinic in 1 year for ABI's.   The patient was given information about PAD including signs, symptoms, treatment, what symptoms should prompt the patient to seek immediate medical care, and risk reduction measures to take.  Clemon Chambers, RN, MSN, FNP-C [facility] Phone: [phone]  Clinic MD: Kellie Simmering  04/04/2014  11:56 AM

## 2014-04-05 ENCOUNTER — Other Ambulatory Visit: Payer: Self-pay | Admitting: *Deleted

## 2014-04-05 DIAGNOSIS — I739 Peripheral vascular disease, unspecified: Secondary | ICD-10-CM

## 2014-04-06 ENCOUNTER — Other Ambulatory Visit: Payer: Self-pay | Admitting: Internal Medicine

## 2014-04-12 ENCOUNTER — Ambulatory Visit: Payer: No Typology Code available for payment source | Attending: Internal Medicine | Admitting: Internal Medicine

## 2014-04-12 ENCOUNTER — Encounter: Payer: Self-pay | Admitting: Internal Medicine

## 2014-04-12 VITALS — BP 110/76 | HR 59 | Temp 97.8°F | Ht 65.0 in | Wt 159.0 lb

## 2014-04-12 DIAGNOSIS — Z7982 Long term (current) use of aspirin: Secondary | ICD-10-CM | POA: Insufficient documentation

## 2014-04-12 DIAGNOSIS — Z853 Personal history of malignant neoplasm of breast: Secondary | ICD-10-CM | POA: Insufficient documentation

## 2014-04-12 DIAGNOSIS — Z7951 Long term (current) use of inhaled steroids: Secondary | ICD-10-CM | POA: Insufficient documentation

## 2014-04-12 DIAGNOSIS — I10 Essential (primary) hypertension: Secondary | ICD-10-CM

## 2014-04-12 DIAGNOSIS — E785 Hyperlipidemia, unspecified: Secondary | ICD-10-CM

## 2014-04-12 DIAGNOSIS — G4700 Insomnia, unspecified: Secondary | ICD-10-CM

## 2014-04-12 DIAGNOSIS — Z87891 Personal history of nicotine dependence: Secondary | ICD-10-CM | POA: Insufficient documentation

## 2014-04-12 DIAGNOSIS — J449 Chronic obstructive pulmonary disease, unspecified: Secondary | ICD-10-CM | POA: Insufficient documentation

## 2014-04-12 DIAGNOSIS — Z1231 Encounter for screening mammogram for malignant neoplasm of breast: Secondary | ICD-10-CM

## 2014-04-12 DIAGNOSIS — K219 Gastro-esophageal reflux disease without esophagitis: Secondary | ICD-10-CM

## 2014-04-12 DIAGNOSIS — Z923 Personal history of irradiation: Secondary | ICD-10-CM | POA: Insufficient documentation

## 2014-04-12 DIAGNOSIS — Z791 Long term (current) use of non-steroidal anti-inflammatories (NSAID): Secondary | ICD-10-CM | POA: Insufficient documentation

## 2014-04-12 MED ORDER — RANITIDINE HCL 150 MG PO CAPS: 150.0000 mg | ORAL_CAPSULE | Freq: Two times a day (BID) | ORAL | Status: DC

## 2014-04-12 MED ORDER — ATORVASTATIN CALCIUM 20 MG PO TABS: 20.0000 mg | ORAL_TABLET | Freq: Every day | ORAL | Status: DC

## 2014-04-12 NOTE — Progress Notes (Signed)
MRN: [id number] Name: Brandy Stevenson  Sex: female Age: 59 y.o. DOB: 30-Jun-1955  Allergies: Vicodin  Chief Complaint  Patient presents with  . Follow-up    HPI: Patient is 59 y.o. female who has is she of GERD, hyperlipidemia, hypertension, COPD comes today for followup as per patient she ran out of for cholesterol medication and its refill, her blood pressure is well controlled, denies any headache dizziness chest and shortness of breath, she also has history of left breast cancer was following up her with oncology has had a lumpectomy done in the past, she did not had a repeat mammogram done. Patient also complaining of problem with the sleep.  Past Medical History  Diagnosis Date  . Hypertension   . COPD (chronic obstructive pulmonary disease)   . Hyperlipemia   . Cancer 2009    Breast    Past Surgical History  Procedure Laterality Date  . Tubal ligation  1991  . Breast surgery    . Breast lumpectomy Left     s/p radiation therapy   . Appendectomy  1980      Medication List       This list is accurate as of: 04/12/14 11:50 AM.  Always use your most recent med list.               albuterol 108 (90 BASE) MCG/ACT inhaler  Commonly known as:  PROVENTIL HFA;VENTOLIN HFA  Inhale 2 puffs into the lungs every 6 (six) hours as needed for wheezing.     ALEVE 220 MG Caps  Generic drug:  Naproxen Sodium  Take by mouth as needed. FOR PAIN     aspirin 81 MG tablet  Take 81 mg by mouth daily.     atorvastatin 20 MG tablet  Commonly known as:  LIPITOR  Take 1 tablet (20 mg total) by mouth daily.     Fluticasone-Salmeterol 100-50 MCG/DOSE Aepb  Commonly known as:  ADVAIR  Inhale 1 puff into the lungs 2 (two) times daily.     gabapentin 300 MG capsule  Commonly known as:  NEURONTIN  TAKE ONE CAPSULE AT BEDTIME     ibuprofen 200 MG tablet  Commonly known as:  ADVIL,MOTRIN  Take 800 mg by mouth every 8 (eight) hours as needed for pain.     lisinopril-hydrochlorothiazide 20-12.5 MG per tablet  Commonly known as:  PRINZIDE,ZESTORETIC  TAKE 1 TABLET BY MOUTH DAILY.     naproxen 500 MG tablet  Commonly known as:  NAPROSYN  Take 1 tablet (500 mg total) by mouth 2 (two) times daily.     potassium chloride 10 MEQ tablet  Commonly known as:  K-DUR  Take 1 tablet (10 mEq total) by mouth daily.     ranitidine 150 MG capsule  Commonly known as:  ZANTAC  Take 1 capsule (150 mg total) by mouth 2 (two) times daily.     Vitamin D (Ergocalciferol) 50000 UNITS Caps capsule  Commonly known as:  DRISDOL  TAKE 1 CAPSULE BY MOUTH EVERY 7 DAYS.        Meds ordered this encounter  Medications  . atorvastatin (LIPITOR) 20 MG tablet    Sig: Take 1 tablet (20 mg total) by mouth daily.    Dispense:  90 tablet    Refill:  3  . ranitidine (ZANTAC) 150 MG capsule    Sig: Take 1 capsule (150 mg total) by mouth 2 (two) times daily.    Dispense:  60 capsule  Refill:  3     There is no immunization history on file for this patient.  Family History  Problem Relation Age of Onset  . Hyperlipidemia Mother   . Hypertension Mother   . Heart disease Mother     Atrial Fib.  . Hyperlipidemia Sister   . Hypertension Sister   . Heart disease Sister     Before age 85,  CHF  . Heart attack Brother   . Hyperlipidemia Brother   . Hypertension Brother   . Hypertension Daughter     History  Substance Use Topics  . Smoking status: Former Smoker -- 0.25 packs/day for 40 years    Types: Cigarettes    Quit date: 03/04/2014  . Smokeless tobacco: Never Used  . Alcohol Use: No    Review of Systems   As noted in HPI  Filed Vitals:   04/12/14 1107  BP: 110/76  Pulse: 59  Temp: 97.8 F (36.6 C)    Physical Exam  Physical Exam  Constitutional: No distress.  Eyes: EOM are normal. Pupils are equal, round, and reactive to light.  Cardiovascular: Normal rate.   Pulmonary/Chest: Breath sounds normal. No respiratory distress. She has  no wheezes. She has no rales.  Musculoskeletal: She exhibits no edema.    CBC    Component Value Date/Time   WBC 4.6 08/24/2013 1717   WBC 4.2 06/12/2008 0939   RBC 3.96 08/24/2013 1717   RBC 4.46 06/12/2008 0939   HGB 12.5 08/24/2013 1717   HGB 13.8 06/12/2008 0939   HCT 36.4 08/24/2013 1717   HCT 40.2 06/12/2008 0939   PLT 228 08/24/2013 1717   PLT 230 06/12/2008 0939   MCV 91.9 08/24/2013 1717   MCV 90.1 06/12/2008 0939   LYMPHSABS 1.7 08/24/2013 1717   LYMPHSABS 1.5 06/12/2008 0939   MONOABS 0.4 08/24/2013 1717   MONOABS 0.6 06/12/2008 0939   EOSABS 0.1 08/24/2013 1717   EOSABS 0.1 06/12/2008 0939   BASOSABS 0.0 08/24/2013 1717   BASOSABS 0.0 06/12/2008 0939    CMP     Component Value Date/Time   NA 141 11/28/2013 0931   K 4.5 11/28/2013 0931   CL 103 11/28/2013 0931   CO2 29 11/28/2013 0931   GLUCOSE 83 11/28/2013 0931   BUN 17 11/28/2013 0931   CREATININE 0.95 11/28/2013 0931   CREATININE 0.82 08/24/2013 1717   CALCIUM 9.5 11/28/2013 0931   PROT 7.4 11/28/2013 0931   ALBUMIN 4.2 11/28/2013 0931   AST 21 11/28/2013 0931   ALT 17 11/28/2013 0931   ALKPHOS 61 11/28/2013 0931   BILITOT 0.3 11/28/2013 0931   GFRNONAA 67 11/28/2013 0931   GFRNONAA 79* 08/24/2013 1717   GFRAA 77 11/28/2013 0931   GFRAA >90 08/24/2013 1717    Lab Results  Component Value Date/Time   CHOL 146 11/28/2013 09:31 AM    No components found for: HGA1C  Lab Results  Component Value Date/Time   AST 21 11/28/2013 09:31 AM    Assessment and Plan  Essential hypertension, benign - Plan: blood pressure is well controlled, continue with current meds, repeat COMPLETE METABOLIC PANEL WITH GFR  Gastroesophageal reflux disease without esophagitis - Plan:lifestyle modification, continue with  ranitidine (ZANTAC) 150 MG capsule  Hyperlipidemia - Plan: resume back on atorvastatin (LIPITOR) 20 MG tablet, will repeat fasting lipid panel on the next visit.  Encounter for screening  mammogram for breast cancer - Plan: MM DIGITAL SCREENING BILATERAL  Insomnia Patient is advised for sleep hygiene, she  will start taking OTC melatonin  Health Maintenance  -Mammogram:ordered -Vaccinations:  Patient declined for flu shot.  Return in about 3 months (around 07/11/2014) for hypertension, hyperipidemia.  Lorayne Marek, MD

## 2014-04-12 NOTE — Progress Notes (Signed)
Patient is here for follow-up for HTN.  Pt has been having difficulty getting medications filled due to finances.  Pt states she has been having increased anxiety, heart palpitations and headaches x 1 month.  Pt denies any chest pain or dizziness.  Pt also c/o having pain and tenderness in left breast x 3 months.

## 2014-04-12 NOTE — Patient Instructions (Signed)
Insomnia Insomnia is frequent trouble falling and/or staying asleep. Insomnia can be a long term problem or a short term problem. Both are common. Insomnia can be a short term problem when the wakefulness is related to a certain stress or worry. Long term insomnia is often related to ongoing stress during waking hours and/or poor sleeping habits. Overtime, sleep deprivation itself can make the problem worse. Every little thing feels more severe because you are overtired and your ability to cope is decreased. CAUSES   Stress, anxiety, and depression.  Poor sleeping habits.  Distractions such as TV in the bedroom.  Naps close to bedtime.  Engaging in emotionally charged conversations before bed.  Technical reading before sleep.  Alcohol and other sedatives. They may make the problem worse. They can hurt normal sleep patterns and normal dream activity.  Stimulants such as caffeine for several hours prior to bedtime.  Pain syndromes and shortness of breath can cause insomnia.  Exercise late at night.  Changing time zones may cause sleeping problems (jet lag). It is sometimes helpful to have someone observe your sleeping patterns. They should look for periods of not breathing during the night (sleep apnea). They should also look to see how long those periods last. If you live alone or observers are uncertain, you can also be observed at a sleep clinic where your sleep patterns will be professionally monitored. Sleep apnea requires a checkup and treatment. Give your caregivers your medical history. Give your caregivers observations your family has made about your sleep.  SYMPTOMS   Not feeling rested in the morning.  Anxiety and restlessness at bedtime.  Difficulty falling and staying asleep. TREATMENT   Your caregiver may prescribe treatment for an underlying medical disorders. Your caregiver can give advice or help if you are using alcohol or other drugs for self-medication. Treatment  of underlying problems will usually eliminate insomnia problems.  Medications can be prescribed for short time use. They are generally not recommended for lengthy use.  Over-the-counter sleep medicines are not recommended for lengthy use. They can be habit forming.  You can promote easier sleeping by making lifestyle changes such as:  Using relaxation techniques that help with breathing and reduce muscle tension.  Exercising earlier in the day.  Changing your diet and the time of your last meal. No night time snacks.  Establish a regular time to go to bed.  Counseling can help with stressful problems and worry.  Soothing music and white noise may be helpful if there are background noises you cannot remove.  Stop tedious detailed work at least one hour before bedtime. HOME CARE INSTRUCTIONS   Keep a diary. Inform your caregiver about your progress. This includes any medication side effects. See your caregiver regularly. Take note of:  Times when you are asleep.  Times when you are awake during the night.  The quality of your sleep.  How you feel the next day. This information will help your caregiver care for you.  Get out of bed if you are still awake after 15 minutes. Read or do some quiet activity. Keep the lights down. Wait until you feel sleepy and go back to bed.  Keep regular sleeping and waking hours. Avoid naps.  Exercise regularly.  Avoid distractions at bedtime. Distractions include watching television or engaging in any intense or detailed activity like attempting to balance the household checkbook.  Develop a bedtime ritual. Keep a familiar routine of bathing, brushing your teeth, climbing into bed at the same   time each night, listening to soothing music. Routines increase the success of falling to sleep faster.  Use relaxation techniques. This can be using breathing and muscle tension release routines. It can also include visualizing peaceful scenes. You can  also help control troubling or intruding thoughts by keeping your mind occupied with boring or repetitive thoughts like the old concept of counting sheep. You can make it more creative like imagining planting one beautiful flower after another in your backyard garden.  During your day, work to eliminate stress. When this is not possible use some of the previous suggestions to help reduce the anxiety that accompanies stressful situations. MAKE SURE YOU:   Understand these instructions.  Will watch your condition.  Will get help right away if you are not doing well or get worse. Document Released: 02/15/2000 Document Revised: 05/12/2011 Document Reviewed: 03/17/2007 [employer] Patient Information 2015 [employer]. This information is not intended to replace advice given to you by your health care provider. Make sure you discuss any questions you have with your health care provider.  

## 2014-04-13 ENCOUNTER — Telehealth: Payer: Self-pay

## 2014-04-13 LAB — COMPLETE METABOLIC PANEL WITH GFR
ALT: 50 U/L — ABNORMAL HIGH (ref 0–35)
AST: 40 U/L — ABNORMAL HIGH (ref 0–37)
Albumin: 4.3 g/dL (ref 3.5–5.2)
Alkaline Phosphatase: 77 U/L (ref 39–117)
BUN: 19 mg/dL (ref 6–23)
CO2: 30 mEq/L (ref 19–32)
Calcium: 9.2 mg/dL (ref 8.4–10.5)
Chloride: 104 mEq/L (ref 96–112)
Creat: 0.75 mg/dL (ref 0.50–1.10)
GFR, Est African American: >89 mL/min
GFR, Est Non African American: 89 mL/min
Glucose, Bld: 90 mg/dL (ref 70–99)
Potassium: 4.4 mEq/L (ref 3.5–5.3)
Sodium: 143 mEq/L (ref 135–145)
Total Bilirubin: 0.3 mg/dL (ref 0.2–1.2)
Total Protein: 7.1 g/dL (ref 6.0–8.3)

## 2014-04-13 NOTE — Telephone Encounter (Signed)
-----   Message from Lorayne Marek, MD sent at 04/13/2014  9:28 AM EST ----- Blood work reviewed noticed borderline elevated liver enzymes, advise patient to avoid alcohol and Tylenol, will repeat her LFTs on the following visit.

## 2014-04-13 NOTE — Telephone Encounter (Signed)
Patient not available Left message on voice mail to return our call 

## 2014-04-13 NOTE — Telephone Encounter (Signed)
Pt aware of lab results 

## 2014-05-23 ENCOUNTER — Other Ambulatory Visit: Payer: Self-pay

## 2014-05-23 DIAGNOSIS — F17200 Nicotine dependence, unspecified, uncomplicated: Secondary | ICD-10-CM

## 2014-05-23 MED ORDER — ALBUTEROL SULFATE HFA 108 (90 BASE) MCG/ACT IN AERS: 2.0000 | INHALATION_SPRAY | Freq: Four times a day (QID) | RESPIRATORY_TRACT | Status: DC | PRN

## 2014-05-23 MED ORDER — FLUTICASONE-SALMETEROL 100-50 MCG/DOSE IN AEPB: 1.0000 | INHALATION_SPRAY | Freq: Two times a day (BID) | RESPIRATORY_TRACT | Status: DC

## 2014-06-06 ENCOUNTER — Telehealth: Payer: Self-pay | Admitting: Internal Medicine

## 2014-06-06 NOTE — Telephone Encounter (Signed)
Pt called requesting medication refill for lisinopril-hydrochlorothiazide (PRINZIDE,ZESTORETIC) 20-12.5 MG per tablet. Pt has to f/u with [place] in may , she uses [facility] pharmacy.Please f/u with pt

## 2014-06-08 ENCOUNTER — Telehealth: Payer: Self-pay | Admitting: Internal Medicine

## 2014-06-08 NOTE — Telephone Encounter (Signed)
Patient is needing refills for the following medication. Please follow up with pt.   lisinopril-hydrochlorothiazide (PRINZIDE,ZESTORETIC) 20-12.5 MG per tablet

## 2014-06-13 ENCOUNTER — Telehealth: Payer: Self-pay | Admitting: Internal Medicine

## 2014-06-13 NOTE — Telephone Encounter (Signed)
Patient came into facility to request a med refill for Lisinopril for next month to last her until her next appointment, patient uses our pharmacy. Please f/u with pt

## 2014-06-19 ENCOUNTER — Ambulatory Visit: Payer: No Typology Code available for payment source

## 2014-06-23 ENCOUNTER — Telehealth: Payer: Self-pay

## 2014-06-23 MED ORDER — LISINOPRIL-HYDROCHLOROTHIAZIDE 20-12.5 MG PO TABS: 1.0000 | ORAL_TABLET | Freq: Every day | ORAL | Status: DC

## 2014-06-23 NOTE — Telephone Encounter (Signed)
Spoke with patient and she is aware her prescription was sent to the pharmacy and She can pick it up

## 2014-07-27 ENCOUNTER — Ambulatory Visit: Payer: No Typology Code available for payment source | Attending: Internal Medicine | Admitting: Internal Medicine

## 2014-07-27 ENCOUNTER — Encounter: Payer: Self-pay | Admitting: Internal Medicine

## 2014-07-27 VITALS — BP 131/82 | HR 62 | Temp 98.2°F | Resp 16 | Wt 152.8 lb

## 2014-07-27 DIAGNOSIS — F17200 Nicotine dependence, unspecified, uncomplicated: Secondary | ICD-10-CM

## 2014-07-27 DIAGNOSIS — Z7951 Long term (current) use of inhaled steroids: Secondary | ICD-10-CM | POA: Insufficient documentation

## 2014-07-27 DIAGNOSIS — Z923 Personal history of irradiation: Secondary | ICD-10-CM | POA: Insufficient documentation

## 2014-07-27 DIAGNOSIS — K219 Gastro-esophageal reflux disease without esophagitis: Secondary | ICD-10-CM | POA: Insufficient documentation

## 2014-07-27 DIAGNOSIS — E559 Vitamin D deficiency, unspecified: Secondary | ICD-10-CM | POA: Insufficient documentation

## 2014-07-27 DIAGNOSIS — R252 Cramp and spasm: Secondary | ICD-10-CM | POA: Insufficient documentation

## 2014-07-27 DIAGNOSIS — Z720 Tobacco use: Secondary | ICD-10-CM

## 2014-07-27 DIAGNOSIS — F17210 Nicotine dependence, cigarettes, uncomplicated: Secondary | ICD-10-CM | POA: Insufficient documentation

## 2014-07-27 DIAGNOSIS — J449 Chronic obstructive pulmonary disease, unspecified: Secondary | ICD-10-CM | POA: Insufficient documentation

## 2014-07-27 DIAGNOSIS — I10 Essential (primary) hypertension: Secondary | ICD-10-CM | POA: Insufficient documentation

## 2014-07-27 DIAGNOSIS — Z7982 Long term (current) use of aspirin: Secondary | ICD-10-CM | POA: Insufficient documentation

## 2014-07-27 DIAGNOSIS — Z791 Long term (current) use of non-steroidal anti-inflammatories (NSAID): Secondary | ICD-10-CM | POA: Insufficient documentation

## 2014-07-27 DIAGNOSIS — E785 Hyperlipidemia, unspecified: Secondary | ICD-10-CM | POA: Insufficient documentation

## 2014-07-27 DIAGNOSIS — Z853 Personal history of malignant neoplasm of breast: Secondary | ICD-10-CM | POA: Insufficient documentation

## 2014-07-27 LAB — LIPID PANEL
Cholesterol: 155 mg/dL (ref 0–200)
HDL: 55 mg/dL (ref >=46)
LDL Cholesterol: 68 mg/dL (ref 0–99)
Total CHOL/HDL Ratio: 2.8 Ratio
Triglycerides: 159 mg/dL — ABNORMAL HIGH (ref <150)
VLDL: 32 mg/dL (ref 0–40)

## 2014-07-27 LAB — COMPLETE METABOLIC PANEL WITH GFR
ALT: 19 U/L (ref 0–35)
AST: 22 U/L (ref 0–37)
Albumin: 4.3 g/dL (ref 3.5–5.2)
Alkaline Phosphatase: 59 U/L (ref 39–117)
BUN: 14 mg/dL (ref 6–23)
CO2: 31 mEq/L (ref 19–32)
Calcium: 9.9 mg/dL (ref 8.4–10.5)
Chloride: 103 mEq/L (ref 96–112)
Creat: 0.8 mg/dL (ref 0.50–1.10)
GFR, Est African American: >89 mL/min
GFR, Est Non African American: 82 mL/min
Glucose, Bld: 91 mg/dL (ref 70–99)
Potassium: 3.2 mEq/L — ABNORMAL LOW (ref 3.5–5.3)
Sodium: 144 mEq/L (ref 135–145)
Total Bilirubin: 0.5 mg/dL (ref 0.2–1.2)
Total Protein: 7.8 g/dL (ref 6.0–8.3)

## 2014-07-27 MED ORDER — FLUTICASONE-SALMETEROL 100-50 MCG/DOSE IN AEPB: 1.0000 | INHALATION_SPRAY | Freq: Two times a day (BID) | RESPIRATORY_TRACT | Status: DC

## 2014-07-27 MED ORDER — LISINOPRIL-HYDROCHLOROTHIAZIDE 20-12.5 MG PO TABS: 1.0000 | ORAL_TABLET | Freq: Every day | ORAL | Status: DC

## 2014-07-27 MED ORDER — ATORVASTATIN CALCIUM 20 MG PO TABS: 20.0000 mg | ORAL_TABLET | Freq: Every day | ORAL | Status: DC

## 2014-07-27 MED ORDER — RANITIDINE HCL 150 MG PO CAPS: 150.0000 mg | ORAL_CAPSULE | Freq: Two times a day (BID) | ORAL | Status: DC

## 2014-07-27 MED ORDER — ALBUTEROL SULFATE HFA 108 (90 BASE) MCG/ACT IN AERS: 2.0000 | INHALATION_SPRAY | Freq: Four times a day (QID) | RESPIRATORY_TRACT | Status: DC | PRN

## 2014-07-27 NOTE — Progress Notes (Signed)
Patient states she is here for her routine check up on her hypertension and Cholesterol.  Patient will also need refills on her medications

## 2014-07-27 NOTE — Patient Instructions (Signed)
DASH Eating Plan °DASH stands for "Dietary Approaches to Stop Hypertension." The DASH eating plan is a healthy eating plan that has been shown to reduce high blood pressure (hypertension). Additional health benefits may include reducing the risk of type 2 diabetes mellitus, heart disease, and stroke. The DASH eating plan may also help with weight loss. °WHAT DO I NEED TO KNOW ABOUT THE DASH EATING PLAN? °For the DASH eating plan, you will follow these general guidelines: °· Choose foods with a percent daily value for sodium of less than 5% (as listed on the food label). °· Use salt-free seasonings or herbs instead of table salt or sea salt. °· Check with your health care provider or pharmacist before using salt substitutes. °· Eat lower-sodium products, often labeled as "lower sodium" or "no salt added." °· Eat fresh foods. °· Eat more vegetables, fruits, and low-fat dairy products. °· Choose whole grains. Look for the word "whole" as the first word in the ingredient list. °· Choose fish and skinless chicken or turkey more often than red meat. Limit fish, poultry, and meat to 6 oz (170 g) each day. °· Limit sweets, desserts, sugars, and sugary drinks. °· Choose heart-healthy fats. °· Limit cheese to 1 oz (28 g) per day. °· Eat more home-cooked food and less restaurant, buffet, and fast food. °· Limit fried foods. °· Cook foods using methods other than frying. °· Limit canned vegetables. If you do use them, rinse them well to decrease the sodium. °· When eating at a restaurant, ask that your food be prepared with less salt, or no salt if possible. °WHAT FOODS CAN I EAT? °Seek help from a dietitian for individual calorie needs. °Grains °Whole grain or whole wheat bread. Brown rice. Whole grain or whole wheat pasta. Quinoa, bulgur, and whole grain cereals. Low-sodium cereals. Corn or whole wheat flour tortillas. Whole grain cornbread. Whole grain crackers. Low-sodium crackers. °Vegetables °Fresh or frozen vegetables  (raw, steamed, roasted, or grilled). Low-sodium or reduced-sodium tomato and vegetable juices. Low-sodium or reduced-sodium tomato sauce and paste. Low-sodium or reduced-sodium canned vegetables.  °Fruits °All fresh, canned (in natural juice), or frozen fruits. °Meat and Other Protein Products °Ground beef (85% or leaner), grass-fed beef, or beef trimmed of fat. Skinless chicken or turkey. Ground chicken or turkey. Pork trimmed of fat. All fish and seafood. Eggs. Dried beans, peas, or lentils. Unsalted nuts and seeds. Unsalted canned beans. °Dairy °Low-fat dairy products, such as skim or 1% milk, 2% or reduced-fat cheeses, low-fat ricotta or cottage cheese, or plain low-fat yogurt. Low-sodium or reduced-sodium cheeses. °Fats and Oils °Tub margarines without trans fats. Light or reduced-fat mayonnaise and salad dressings (reduced sodium). Avocado. Safflower, olive, or canola oils. Natural peanut or almond butter. °Other °Unsalted popcorn and pretzels. °The items listed above may not be a complete list of recommended foods or beverages. Contact your dietitian for more options. °WHAT FOODS ARE NOT RECOMMENDED? °Grains °White bread. White pasta. White rice. Refined cornbread. Bagels and croissants. Crackers that contain trans fat. °Vegetables °Creamed or fried vegetables. Vegetables in a cheese sauce. Regular canned vegetables. Regular canned tomato sauce and paste. Regular tomato and vegetable juices. °Fruits °Dried fruits. Canned fruit in light or heavy syrup. Fruit juice. °Meat and Other Protein Products °Fatty cuts of meat. Ribs, chicken wings, bacon, sausage, bologna, salami, chitterlings, fatback, hot dogs, bratwurst, and packaged luncheon meats. Salted nuts and seeds. Canned beans with salt. °Dairy °Whole or 2% milk, cream, half-and-half, and cream cheese. Whole-fat or sweetened yogurt. Full-fat   cheeses or blue cheese. Nondairy creamers and whipped toppings. Processed cheese, cheese spreads, or cheese  curds. °Condiments °Onion and garlic salt, seasoned salt, table salt, and sea salt. Canned and packaged gravies. Worcestershire sauce. Tartar sauce. Barbecue sauce. Teriyaki sauce. Soy sauce, including reduced sodium. Steak sauce. Fish sauce. Oyster sauce. Cocktail sauce. Horseradish. Ketchup and mustard. Meat flavorings and tenderizers. Bouillon cubes. Hot sauce. Tabasco sauce. Marinades. Taco seasonings. Relishes. °Fats and Oils °Butter, stick margarine, lard, shortening, ghee, and bacon fat. Coconut, palm kernel, or palm oils. Regular salad dressings. °Other °Pickles and olives. Salted popcorn and pretzels. °The items listed above may not be a complete list of foods and beverages to avoid. Contact your dietitian for more information. °WHERE CAN I FIND MORE INFORMATION? °National Heart, [employer], and [employer] Institute: www.nhlbi.nih.gov/health/health-topics/topics/dash/ °Document Released: 02/06/2011 Document Revised: 07/04/2013 Document Reviewed: 12/22/2012 °ExitCare® Patient Information ©2015 [employer]. This information is not intended to replace advice given to you by your health care provider. Make sure you discuss any questions you have with your health care provider. ° °

## 2014-07-27 NOTE — Progress Notes (Signed)
MRN: [id number] Name: Brandy Stevenson  Sex: female Age: 59 y.o. DOB: Aug 27, 1955  Allergies: Vicodin  Chief Complaint  Patient presents with  . Follow-up    HPI: Patient is 59 y.o. female who history of hypertension , hyperlipidemia, GERD, COPD comes today for followup , she is requesting refill on her medications, previous blood work reviewed, noticed borderline liver enzyme elevation, as per patient she does not drink alcohol.patient still smoke cigarettes, I have counseled patient to quit smoking. Patient is complaining of leg cramps, in the past she was also low on vitamin D.  Past Medical History  Diagnosis Date  . Hypertension   . COPD (chronic obstructive pulmonary disease)   . Hyperlipemia   . Cancer 2009    Breast    Past Surgical History  Procedure Laterality Date  . Tubal ligation  1991  . Breast surgery    . Breast lumpectomy Left     s/p radiation therapy   . Appendectomy  1980      Medication List       This list is accurate as of: 07/27/14  1:02 PM.  Always use your most recent med list.               albuterol 108 (90 BASE) MCG/ACT inhaler  Commonly known as:  PROVENTIL HFA;VENTOLIN HFA  Inhale 2 puffs into the lungs every 6 (six) hours as needed for wheezing.     ALEVE 220 MG Caps  Generic drug:  Naproxen Sodium  Take by mouth as needed. FOR PAIN     aspirin 81 MG tablet  Take 81 mg by mouth daily.     atorvastatin 20 MG tablet  Commonly known as:  LIPITOR  Take 1 tablet (20 mg total) by mouth daily.     Fluticasone-Salmeterol 100-50 MCG/DOSE Aepb  Commonly known as:  ADVAIR  Inhale 1 puff into the lungs 2 (two) times daily.     gabapentin 300 MG capsule  Commonly known as:  NEURONTIN  TAKE ONE CAPSULE AT BEDTIME     ibuprofen 200 MG tablet  Commonly known as:  ADVIL,MOTRIN  Take 800 mg by mouth every 8 (eight) hours as needed for pain.     lisinopril-hydrochlorothiazide 20-12.5 MG per tablet  Commonly known as:   PRINZIDE,ZESTORETIC  Take 1 tablet by mouth daily.     naproxen 500 MG tablet  Commonly known as:  NAPROSYN  Take 1 tablet (500 mg total) by mouth 2 (two) times daily.     potassium chloride 10 MEQ tablet  Commonly known as:  K-DUR  Take 1 tablet (10 mEq total) by mouth daily.     ranitidine 150 MG capsule  Commonly known as:  ZANTAC  Take 1 capsule (150 mg total) by mouth 2 (two) times daily.     Vitamin D (Ergocalciferol) 50000 UNITS Caps capsule  Commonly known as:  DRISDOL  TAKE 1 CAPSULE BY MOUTH EVERY 7 DAYS.        Meds ordered this encounter  Medications  . albuterol (PROVENTIL HFA;VENTOLIN HFA) 108 (90 BASE) MCG/ACT inhaler    Sig: Inhale 2 puffs into the lungs every 6 (six) hours as needed for wheezing.    Dispense:  3 Inhaler    Refill:  12  . atorvastatin (LIPITOR) 20 MG tablet    Sig: Take 1 tablet (20 mg total) by mouth daily.    Dispense:  90 tablet    Refill:  3  . Fluticasone-Salmeterol (ADVAIR) 100-50  MCG/DOSE AEPB    Sig: Inhale 1 puff into the lungs 2 (two) times daily.    Dispense:  180 each    Refill:  3  . lisinopril-hydrochlorothiazide (PRINZIDE,ZESTORETIC) 20-12.5 MG per tablet    Sig: Take 1 tablet by mouth daily.    Dispense:  30 tablet    Refill:  2  . ranitidine (ZANTAC) 150 MG capsule    Sig: Take 1 capsule (150 mg total) by mouth 2 (two) times daily.    Dispense:  60 capsule    Refill:  3     There is no immunization history on file for this patient.  Family History  Problem Relation Age of Onset  . Hyperlipidemia Mother   . Hypertension Mother   . Heart disease Mother     Atrial Fib.  . Hyperlipidemia Sister   . Hypertension Sister   . Heart disease Sister     Before age 68,  CHF  . Heart attack Brother   . Hyperlipidemia Brother   . Hypertension Brother   . Hypertension Daughter     History  Substance Use Topics  . Smoking status: Former Smoker -- 0.25 packs/day for 40 years    Types: Cigarettes    Quit date:  03/04/2014  . Smokeless tobacco: Never Used  . Alcohol Use: No    Review of Systems   As noted in HPI  Filed Vitals:   07/27/14 1136  BP: 131/82  Pulse: 62  Temp: 98.2 F (36.8 C)  Resp: 16    Physical Exam  Physical Exam  Constitutional: No distress.  Eyes: EOM are normal. Pupils are equal, round, and reactive to light.  Cardiovascular: Normal rate and regular rhythm.   Pulmonary/Chest: Breath sounds normal. No respiratory distress. She has no wheezes. She has no rales.  Musculoskeletal: She exhibits no edema.    CBC    Component Value Date/Time   WBC 4.6 08/24/2013 1717   WBC 4.2 06/12/2008 0939   RBC 3.96 08/24/2013 1717   RBC 4.46 06/12/2008 0939   HGB 12.5 08/24/2013 1717   HGB 13.8 06/12/2008 0939   HCT 36.4 08/24/2013 1717   HCT 40.2 06/12/2008 0939   PLT 228 08/24/2013 1717   PLT 230 06/12/2008 0939   MCV 91.9 08/24/2013 1717   MCV 90.1 06/12/2008 0939   LYMPHSABS 1.7 08/24/2013 1717   LYMPHSABS 1.5 06/12/2008 0939   MONOABS 0.4 08/24/2013 1717   MONOABS 0.6 06/12/2008 0939   EOSABS 0.1 08/24/2013 1717   EOSABS 0.1 06/12/2008 0939   BASOSABS 0.0 08/24/2013 1717   BASOSABS 0.0 06/12/2008 0939    CMP     Component Value Date/Time   NA 143 04/12/2014 1143   K 4.4 04/12/2014 1143   CL 104 04/12/2014 1143   CO2 30 04/12/2014 1143   GLUCOSE 90 04/12/2014 1143   BUN 19 04/12/2014 1143   CREATININE 0.75 04/12/2014 1143   CREATININE 0.82 08/24/2013 1717   CALCIUM 9.2 04/12/2014 1143   PROT 7.1 04/12/2014 1143   ALBUMIN 4.3 04/12/2014 1143   AST 40* 04/12/2014 1143   ALT 50* 04/12/2014 1143   ALKPHOS 77 04/12/2014 1143   BILITOT 0.3 04/12/2014 1143   GFRNONAA 89 04/12/2014 1143   GFRNONAA 79* 08/24/2013 1717   GFRAA >89 04/12/2014 1143   GFRAA >90 08/24/2013 1717    Lab Results  Component Value Date/Time   CHOL 146 11/28/2013 09:31 AM    No results found for: HGBA1C  Lab Results  Component Value Date/Time   AST 40* 04/12/2014 11:43  AM    Assessment and Plan  Essential hypertension, benign - Plan:advised patient for DASH diet, continue with  lisinopril-hydrochlorothiazide (PRINZIDE,ZESTORETIC) 20-12.5 MG per tablet  Smoking Counseled patient to quit smoking  Hyperlipidemia - Plan: Currently patient is onatorvastatin (LIPITOR) 20 MG tablet,will repeat Lipid panel  Gastroesophageal reflux disease without esophagitis - Plan: ranitidine (ZANTAC) 150 MG capsule  Chronic obstructive pulmonary disease, unspecified COPD, unspecified chronic bronchitis type - Plan: albuterol (PROVENTIL HFA;VENTOLIN HFA) 108 (90 BASE) MCG/ACT inhaler, Fluticasone-Salmeterol (ADVAIR) 100-50 MCG/DOSE AEPB  Vitamin D deficiency - Plan: Vit D  25 hydroxy (rtn osteoporosis monitoring)  Cramp of both lower extremities - Plan: COMPLETE METABOLIC PANEL WITH GFR, Vit D  25 hydroxy (rtn osteoporosis monitoring)   Return in about 3 months (around 10/27/2014) for hypertension.   This note has been created with Surveyor, quantity. Any transcriptional errors are unintentional.    Lorayne Marek, MD

## 2014-07-28 LAB — VITAMIN D 25 HYDROXY (VIT D DEFICIENCY, FRACTURES): Vit D, 25-Hydroxy: 20 ng/mL — ABNORMAL LOW (ref 30–100)

## 2014-08-15 ENCOUNTER — Telehealth: Payer: Self-pay | Admitting: *Deleted

## 2014-08-15 NOTE — Telephone Encounter (Signed)
-----   Message from Lorayne Marek, MD sent at 07/28/2014 10:52 AM EDT ----- Blood work reviewed  potassium is borderline low, advise patient to take KCL 20 mEq daily for 10 days. noticed low vitamin D, call patient advise to start ergocalciferol 50,000 units once a week for the duration of  12 weeks, then take OTC vitamin d 2000 units daily.

## 2014-08-15 NOTE — Telephone Encounter (Signed)
Pt aware of resuls Advised to take Vit D weekly then 2000 unit daily Potassium 1 pill for 10 days

## 2014-09-08 ENCOUNTER — Telehealth: Payer: Self-pay

## 2014-09-08 ENCOUNTER — Other Ambulatory Visit: Payer: Self-pay

## 2014-09-08 DIAGNOSIS — E876 Hypokalemia: Secondary | ICD-10-CM

## 2014-09-08 MED ORDER — POTASSIUM CHLORIDE CRYS ER 20 MEQ PO TBCR: 20.0000 meq | EXTENDED_RELEASE_TABLET | Freq: Every day | ORAL | Status: DC

## 2014-09-08 NOTE — Telephone Encounter (Signed)
Returned patient phone call Prescriptions sent to pharmacy Patient is aware to pick them up when she gets a chance

## 2014-09-27 ENCOUNTER — Ambulatory Visit: Payer: No Typology Code available for payment source | Admitting: Internal Medicine

## 2014-09-29 ENCOUNTER — Ambulatory Visit: Payer: No Typology Code available for payment source | Attending: Internal Medicine | Admitting: Internal Medicine

## 2014-09-29 ENCOUNTER — Encounter: Payer: Self-pay | Admitting: Internal Medicine

## 2014-09-29 VITALS — BP 114/73 | HR 68 | Temp 98.0°F | Resp 16 | Wt 153.4 lb

## 2014-09-29 DIAGNOSIS — E785 Hyperlipidemia, unspecified: Secondary | ICD-10-CM | POA: Insufficient documentation

## 2014-09-29 DIAGNOSIS — E876 Hypokalemia: Secondary | ICD-10-CM

## 2014-09-29 DIAGNOSIS — Z79899 Other long term (current) drug therapy: Secondary | ICD-10-CM | POA: Insufficient documentation

## 2014-09-29 DIAGNOSIS — Z87891 Personal history of nicotine dependence: Secondary | ICD-10-CM | POA: Insufficient documentation

## 2014-09-29 DIAGNOSIS — E559 Vitamin D deficiency, unspecified: Secondary | ICD-10-CM | POA: Insufficient documentation

## 2014-09-29 DIAGNOSIS — L309 Dermatitis, unspecified: Secondary | ICD-10-CM | POA: Insufficient documentation

## 2014-09-29 DIAGNOSIS — J449 Chronic obstructive pulmonary disease, unspecified: Secondary | ICD-10-CM | POA: Insufficient documentation

## 2014-09-29 DIAGNOSIS — Z7982 Long term (current) use of aspirin: Secondary | ICD-10-CM | POA: Insufficient documentation

## 2014-09-29 DIAGNOSIS — I10 Essential (primary) hypertension: Secondary | ICD-10-CM | POA: Insufficient documentation

## 2014-09-29 MED ORDER — CLOBETASOL PROPIONATE 0.05 % EX CREA: 1.0000 "application " | TOPICAL_CREAM | Freq: Two times a day (BID) | CUTANEOUS | Status: DC

## 2014-09-29 MED ORDER — VITAMIN D (ERGOCALCIFEROL) 1.25 MG (50000 UNIT) PO CAPS: ORAL_CAPSULE | ORAL | Status: DC

## 2014-09-29 NOTE — Progress Notes (Signed)
Patient here follow up  Complains of still having bilateral leg pain And now has a rash to the bottom of her right foot

## 2014-09-29 NOTE — Progress Notes (Signed)
MRN: [id number] Name: Brandy Stevenson  Sex: female Age: 59 y.o. DOB: 08/14/1955  Allergies: Vicodin  Chief Complaint  Patient presents with  . Follow-up    HPI: Patient is 59 y.o. female who has history of hypertension, hyperlipidemia, COPD comes today for followup , still complaining of leg cramps, previous blood work reviewed with the patient noticed low potassium level as per patient she was started taking potassium supplement, also have been low on vitamin D and is requesting refill on vitamin D prescription, she's up so complaining of dry/ thick skin in her feet, denies any fever chills any discharge denies any trauma.  Past Medical History  Diagnosis Date  . Hypertension   . COPD (chronic obstructive pulmonary disease)   . Hyperlipemia   . Cancer 2009    Breast    Past Surgical History  Procedure Laterality Date  . Tubal ligation  1991  . Breast surgery    . Breast lumpectomy Left     s/p radiation therapy   . Appendectomy  1980      Medication List       This list is accurate as of: 09/29/14  3:12 PM.  Always use your most recent med list.               albuterol 108 (90 BASE) MCG/ACT inhaler  Commonly known as:  PROVENTIL HFA;VENTOLIN HFA  Inhale 2 puffs into the lungs every 6 (six) hours as needed for wheezing.     ALEVE 220 MG Caps  Generic drug:  Naproxen Sodium  Take by mouth as needed. FOR PAIN     aspirin 81 MG tablet  Take 81 mg by mouth daily.     atorvastatin 20 MG tablet  Commonly known as:  LIPITOR  Take 1 tablet (20 mg total) by mouth daily.     clobetasol cream 0.05 %  Commonly known as:  TEMOVATE  Apply 1 application topically 2 (two) times daily.     Fluticasone-Salmeterol 100-50 MCG/DOSE Aepb  Commonly known as:  ADVAIR  Inhale 1 puff into the lungs 2 (two) times daily.     gabapentin 300 MG capsule  Commonly known as:  NEURONTIN  TAKE ONE CAPSULE AT BEDTIME     ibuprofen 200 MG tablet  Commonly known as:  ADVIL,MOTRIN    Take 800 mg by mouth every 8 (eight) hours as needed for pain.     lisinopril-hydrochlorothiazide 20-12.5 MG per tablet  Commonly known as:  PRINZIDE,ZESTORETIC  Take 1 tablet by mouth daily.     naproxen 500 MG tablet  Commonly known as:  NAPROSYN  Take 1 tablet (500 mg total) by mouth 2 (two) times daily.     potassium chloride 10 MEQ tablet  Commonly known as:  K-DUR  Take 1 tablet (10 mEq total) by mouth daily.     potassium chloride SA 20 MEQ tablet  Commonly known as:  K-DUR,KLOR-CON  Take 1 tablet (20 mEq total) by mouth daily.     ranitidine 150 MG capsule  Commonly known as:  ZANTAC  Take 1 capsule (150 mg total) by mouth 2 (two) times daily.     Vitamin D (Ergocalciferol) 50000 UNITS Caps capsule  Commonly known as:  DRISDOL  TAKE 1 CAPSULE BY MOUTH EVERY 7 DAYS.        Meds ordered this encounter  Medications  . clobetasol cream (TEMOVATE) 0.05 %    Sig: Apply 1 application topically 2 (two) times daily.  Dispense:  30 g    Refill:  1  . Vitamin D, Ergocalciferol, (DRISDOL) 50000 UNITS CAPS capsule    Sig: TAKE 1 CAPSULE BY MOUTH EVERY 7 DAYS.    Dispense:  12 capsule    Refill:  0     There is no immunization history on file for this patient.  Family History  Problem Relation Age of Onset  . Hyperlipidemia Mother   . Hypertension Mother   . Heart disease Mother     Atrial Fib.  . Hyperlipidemia Sister   . Hypertension Sister   . Heart disease Sister     Before age 7,  CHF  . Heart attack Brother   . Hyperlipidemia Brother   . Hypertension Brother   . Hypertension Daughter     History  Substance Use Topics  . Smoking status: Former Smoker -- 0.25 packs/day for 40 years    Types: Cigarettes    Quit date: 03/04/2014  . Smokeless tobacco: Never Used  . Alcohol Use: No    Review of Systems   As noted in HPI  Filed Vitals:   09/29/14 1424  BP: 114/73  Pulse: 68  Temp: 98 F (36.7 C)  Resp: 16    Physical Exam  Physical  Exam  Constitutional: No distress.  Eyes: EOM are normal. Pupils are equal, round, and reactive to light.  Cardiovascular: Normal rate and regular rhythm.   Pulmonary/Chest: Breath sounds normal. No respiratory distress. She has no wheezes. She has no rales.    Labs   Lab Results  Component Value Date   WBC 4.6 08/24/2013   HGB 12.5 08/24/2013   HCT 36.4 08/24/2013   PLT 228 08/24/2013   GLUCOSE 91 07/27/2014   CHOL 155 07/27/2014   TRIG 159* 07/27/2014   HDL 55 07/27/2014   LDLCALC 68 07/27/2014   ALT 19 07/27/2014   AST 22 07/27/2014   NA 144 07/27/2014   K 3.2* 07/27/2014   CL 103 07/27/2014   CREATININE 0.80 07/27/2014   BUN 14 07/27/2014   CO2 31 07/27/2014   TSH 0.971 02/23/2013    No results found for: HGBA1C   Assessment and Plan  Essential hypertension, benign - Plan: blood pressure is controlled continue with current meds, repeat blood chemistry COMPLETE METABOLIC PANEL WITH GFR  Vitamin D deficiency - Plan: Vitamin D, Ergocalciferol, (DRISDOL) 50000 UNITS CAPS capsule  Hypokalemia - Plan: repeat blood chemistry COMPLETE METABOLIC PANEL WITH GFR  Dermatitis - Plan: clobetasol cream (TEMOVATE) 0.05 %   Return in about 3 months (around 12/30/2014), or if symptoms worsen or fail to improve.   This note has been created with Surveyor, quantity. Any transcriptional errors are unintentional.    Lorayne Marek, MD

## 2014-09-30 LAB — COMPLETE METABOLIC PANEL WITH GFR
ALT: 17 U/L (ref 6–29)
AST: 17 U/L (ref 10–35)
Albumin: 4.1 g/dL (ref 3.6–5.1)
Alkaline Phosphatase: 66 U/L (ref 33–130)
BUN: 14 mg/dL (ref 7–25)
CO2: 28 mmol/L (ref 20–31)
Calcium: 9.2 mg/dL (ref 8.6–10.4)
Chloride: 103 mmol/L (ref 98–110)
Creat: 0.89 mg/dL (ref 0.50–1.05)
GFR, Est African American: 83 mL/min (ref >=60)
GFR, Est Non African American: 72 mL/min (ref >=60)
Glucose, Bld: 75 mg/dL (ref 65–99)
Potassium: 3.8 mmol/L (ref 3.5–5.3)
Sodium: 139 mmol/L (ref 135–146)
Total Bilirubin: 0.4 mg/dL (ref 0.2–1.2)
Total Protein: 7.1 g/dL (ref 6.1–8.1)

## 2014-10-02 ENCOUNTER — Telehealth: Payer: Self-pay

## 2014-10-02 NOTE — Telephone Encounter (Signed)
-----   Message from Lorayne Marek, MD sent at 10/02/2014  9:54 AM EDT ----- Call and let the Patient know that blood work is normal.

## 2014-10-02 NOTE — Telephone Encounter (Signed)
Patient is aware of her lab results 

## 2014-10-11 ENCOUNTER — Other Ambulatory Visit: Payer: Self-pay

## 2014-10-11 DIAGNOSIS — L309 Dermatitis, unspecified: Secondary | ICD-10-CM

## 2014-10-11 MED ORDER — CLOBETASOL PROPIONATE 0.05 % EX GEL: CUTANEOUS | Status: DC

## 2014-10-12 ENCOUNTER — Emergency Department (HOSPITAL_COMMUNITY): Payer: No Typology Code available for payment source

## 2014-10-12 ENCOUNTER — Emergency Department (HOSPITAL_COMMUNITY)
Admission: EM | Admit: 2014-10-12 | Discharge: 2014-10-12 | Disposition: A | Discharge status: Home / Self Care | Payer: No Typology Code available for payment source | Attending: Emergency Medicine | Admitting: Emergency Medicine

## 2014-10-12 ENCOUNTER — Other Ambulatory Visit: Payer: Self-pay | Admitting: Family Medicine

## 2014-10-12 ENCOUNTER — Encounter (HOSPITAL_COMMUNITY): Payer: Self-pay

## 2014-10-12 DIAGNOSIS — Y9289 Other specified places as the place of occurrence of the external cause: Secondary | ICD-10-CM | POA: Insufficient documentation

## 2014-10-12 DIAGNOSIS — E785 Hyperlipidemia, unspecified: Secondary | ICD-10-CM | POA: Insufficient documentation

## 2014-10-12 DIAGNOSIS — W1839XA Other fall on same level, initial encounter: Secondary | ICD-10-CM | POA: Insufficient documentation

## 2014-10-12 DIAGNOSIS — Y9389 Activity, other specified: Secondary | ICD-10-CM | POA: Insufficient documentation

## 2014-10-12 DIAGNOSIS — J449 Chronic obstructive pulmonary disease, unspecified: Secondary | ICD-10-CM | POA: Insufficient documentation

## 2014-10-12 DIAGNOSIS — Z853 Personal history of malignant neoplasm of breast: Secondary | ICD-10-CM | POA: Insufficient documentation

## 2014-10-12 DIAGNOSIS — Y998 Other external cause status: Secondary | ICD-10-CM | POA: Insufficient documentation

## 2014-10-12 DIAGNOSIS — R519 Headache, unspecified: Secondary | ICD-10-CM

## 2014-10-12 DIAGNOSIS — S0990XA Unspecified injury of head, initial encounter: Secondary | ICD-10-CM | POA: Insufficient documentation

## 2014-10-12 DIAGNOSIS — Z7982 Long term (current) use of aspirin: Secondary | ICD-10-CM | POA: Insufficient documentation

## 2014-10-12 DIAGNOSIS — R51 Headache: Secondary | ICD-10-CM

## 2014-10-12 DIAGNOSIS — Z79899 Other long term (current) drug therapy: Secondary | ICD-10-CM | POA: Insufficient documentation

## 2014-10-12 DIAGNOSIS — R0981 Nasal congestion: Secondary | ICD-10-CM | POA: Insufficient documentation

## 2014-10-12 DIAGNOSIS — I10 Essential (primary) hypertension: Secondary | ICD-10-CM | POA: Insufficient documentation

## 2014-10-12 DIAGNOSIS — Z87891 Personal history of nicotine dependence: Secondary | ICD-10-CM | POA: Insufficient documentation

## 2014-10-12 DIAGNOSIS — J988 Other specified respiratory disorders: Secondary | ICD-10-CM

## 2014-10-12 LAB — CBC WITH DIFFERENTIAL/PLATELET
Basophils Absolute: 0 10*3/uL (ref 0.0–0.1)
Basophils Relative: 1 % (ref 0–1)
Eosinophils Absolute: 0.1 10*3/uL (ref 0.0–0.7)
Eosinophils Relative: 2 % (ref 0–5)
HCT: 38.3 % (ref 36.0–46.0)
Hemoglobin: 12.9 g/dL (ref 12.0–15.0)
Lymphocytes Relative: 45 % (ref 12–46)
Lymphs Abs: 1.9 10*3/uL (ref 0.7–4.0)
MCH: 31.4 pg (ref 26.0–34.0)
MCHC: 33.7 g/dL (ref 30.0–36.0)
MCV: 93.2 fL (ref 78.0–100.0)
Monocytes Absolute: 0.5 10*3/uL (ref 0.1–1.0)
Monocytes Relative: 12 % (ref 3–12)
Neutro Abs: 1.7 10*3/uL (ref 1.7–7.7)
Neutrophils Relative %: 40 % — ABNORMAL LOW (ref 43–77)
Platelets: 223 10*3/uL (ref 150–400)
RBC: 4.11 MIL/uL (ref 3.87–5.11)
RDW: 13.3 % (ref 11.5–15.5)
WBC: 4.2 10*3/uL (ref 4.0–10.5)

## 2014-10-12 LAB — COMPREHENSIVE METABOLIC PANEL
ALT: 19 U/L (ref 14–54)
AST: 21 U/L (ref 15–41)
Albumin: 4.4 g/dL (ref 3.5–5.0)
Alkaline Phosphatase: 56 U/L (ref 38–126)
Anion gap: 7 (ref 5–15)
BUN: 17 mg/dL (ref 6–20)
CO2: 32 mmol/L (ref 22–32)
Calcium: 10 mg/dL (ref 8.9–10.3)
Chloride: 102 mmol/L (ref 101–111)
Creatinine, Ser: 0.82 mg/dL (ref 0.44–1.00)
GFR calc Af Amer: >60 mL/min (ref >60)
GFR calc non Af Amer: >60 mL/min (ref >60)
Glucose, Bld: 91 mg/dL (ref 65–99)
Potassium: 3.4 mmol/L — ABNORMAL LOW (ref 3.5–5.1)
Sodium: 141 mmol/L (ref 135–145)
Total Bilirubin: 0.4 mg/dL (ref 0.3–1.2)
Total Protein: 8.2 g/dL — ABNORMAL HIGH (ref 6.5–8.1)

## 2014-10-12 LAB — URINE MICROSCOPIC-ADD ON

## 2014-10-12 LAB — URINALYSIS, ROUTINE W REFLEX MICROSCOPIC
Bilirubin Urine: NEGATIVE
Glucose, UA: NEGATIVE mg/dL
Ketones, ur: NEGATIVE mg/dL
Leukocytes, UA: NEGATIVE
Nitrite: NEGATIVE
Protein, ur: NEGATIVE mg/dL
Specific Gravity, Urine: 1.008 (ref 1.005–1.030)
Urobilinogen, UA: 1 mg/dL (ref 0.0–1.0)
pH: 6.5 (ref 5.0–8.0)

## 2014-10-12 MED ORDER — CLOBETASOL PROPIONATE 0.05 % EX GEL: 1.0000 "application " | Freq: Two times a day (BID) | CUTANEOUS | Status: DC

## 2014-10-12 MED ORDER — SODIUM CHLORIDE 0.9 % IV BOLUS (SEPSIS)
1000.0000 mL | Freq: Once | INTRAVENOUS | Status: AC
  Administered 2014-10-12: 1000 mL via INTRAVENOUS

## 2014-10-12 NOTE — ED Provider Notes (Signed)
CSN: [id number]     Arrival date & time 10/12/14  1312 History   First MD Initiated Contact with Patient 10/12/14 1328     Chief Complaint  Patient presents with  . Fall  . Headache  . Arm Pain  . Nausea     (Consider location/radiation/quality/duration/timing/severity/associated sxs/prior Treatment) HPI  59 year old female who presents with left sided headache, generalized malaise, feeling hot and cold, nauseous, and congested.  Reports symptoms for a few days. No known sick contacts, no runny nose or sore throat. Has had cough productive of phlegm.  3 days ago, mechical fall on the left side, caught self, but said she fell because her legs both weak and gave out from under her.  Complains of mild myalgias all over. History of migraines, but not similar to that. Usual migraines more severe per patient, but this headache comes and goes. Denies ear pain, hearing changes, ataxia, weakness or numbness, vision changes, speech changes, neck pain or stiffness.  Past Medical History  Diagnosis Date  . Hypertension   . COPD (chronic obstructive pulmonary disease)   . Hyperlipemia   . Cancer 2009    Breast   Past Surgical History  Procedure Laterality Date  . Tubal ligation  1991  . Breast surgery    . Breast lumpectomy Left     s/p radiation therapy   . Appendectomy  1980   Family History  Problem Relation Age of Onset  . Hyperlipidemia Mother   . Hypertension Mother   . Heart disease Mother     Atrial Fib.  . Hyperlipidemia Sister   . Hypertension Sister   . Heart disease Sister     Before age 46,  CHF  . Heart attack Brother   . Hyperlipidemia Brother   . Hypertension Brother   . Hypertension Daughter    Social History  Substance Use Topics  . Smoking status: Former Smoker -- 0.25 packs/day for 40 years    Types: Cigarettes    Quit date: 03/04/2014  . Smokeless tobacco: Never Used  . Alcohol Use: No   OB History    No data available     Review of Systems 10/14  systems reviewed and are negative other than those stated in the HPI    Allergies  Vicodin  Home Medications   Prior to Admission medications   Medication Sig Start Date End Date Taking? Authorizing Provider  albuterol (PROVENTIL HFA;VENTOLIN HFA) 108 (90 BASE) MCG/ACT inhaler Inhale 2 puffs into the lungs every 6 (six) hours as needed for wheezing. 07/27/14  Yes Lorayne Marek, MD  aspirin 81 MG tablet Take 81 mg by mouth daily.   Yes Historical Provider, MD  atorvastatin (LIPITOR) 20 MG tablet Take 1 tablet (20 mg total) by mouth daily. 07/27/14  Yes Deepak Advani, MD  Fluticasone-Salmeterol (ADVAIR) 100-50 MCG/DOSE AEPB Inhale 1 puff into the lungs 2 (two) times daily. 07/27/14  Yes Deepak Advani, MD  gabapentin (NEURONTIN) 300 MG capsule TAKE ONE CAPSULE AT BEDTIME Patient taking differently: TAKE 300 MG BY MOUTH AT BEDTIME. 03/08/14  Yes Deepak Advani, MD  ibuprofen (ADVIL,MOTRIN) 200 MG tablet Take 200-800 mg by mouth every 8 (eight) hours as needed for pain.    Yes Historical Provider, MD  lisinopril-hydrochlorothiazide (PRINZIDE,ZESTORETIC) 20-12.5 MG per tablet Take 1 tablet by mouth daily. 07/27/14  Yes Lorayne Marek, MD  ranitidine (ZANTAC) 150 MG capsule Take 1 capsule (150 mg total) by mouth 2 (two) times daily. 07/27/14  Yes Deepak Advani,  MD  clobetasol (TEMOVATE) 0.05 % GEL Apply 1 application topically 2 (two) times daily. Apply 1 application topically two times daily Patient not taking: Reported on 10/12/2014 10/12/14   Boykin Nearing, MD  clobetasol cream (TEMOVATE) 1.61 % Apply 1 application topically 2 (two) times daily. Patient not taking: Reported on 10/12/2014 09/29/14   Lorayne Marek, MD  potassium chloride SA (K-DUR,KLOR-CON) 20 MEQ tablet Take 1 tablet (20 mEq total) by mouth daily. Patient not taking: Reported on 10/12/2014 09/08/14   Lorayne Marek, MD  Vitamin D, Ergocalciferol, (DRISDOL) 50000 UNITS CAPS capsule TAKE 1 CAPSULE BY MOUTH EVERY 7 DAYS. Patient not taking:  Reported on 10/12/2014 09/29/14   Lorayne Marek, MD   BP 158/111 mmHg  Pulse 58  Temp(Src) 98.2 F (36.8 C) (Oral)  Resp 18  SpO2 100% Physical Exam Physical Exam  Nursing note and vitals reviewed. Constitutional: Well developed, well nourished, non-toxic, and in no acute distress Head: Normocephalic and atraumatic.  Mouth/Throat: Oropharynx is clear and moist.  Neck: Normal range of motion. Neck supple. No meningismus.  Cardiovascular: Normal rate and regular rhythm.   Pulmonary/Chest: Effort normal and breath sounds normal.  Abdominal: Soft. There is no tenderness. There is no rebound and no guarding.  Musculoskeletal: Normal range of motion.  Neurological: Alert, no facial droop, fluent speech, moves all extremities symmetrically, sensation to light touch in tact throughout. PERRL.  Skin: Skin is warm and dry.  Psychiatric: Cooperative  ED Course  Procedures (including critical care time) Labs Review Labs Reviewed  URINALYSIS, ROUTINE W REFLEX MICROSCOPIC (NOT AT [facility]) - Abnormal; Notable for the following:    APPearance CLOUDY (*)    Hgb urine dipstick TRACE (*)    All other components within normal limits  CBC WITH DIFFERENTIAL/PLATELET - Abnormal; Notable for the following:    Neutrophils Relative % 40 (*)    All other components within normal limits  COMPREHENSIVE METABOLIC PANEL - Abnormal; Notable for the following:    Potassium 3.4 (*)    Total Protein 8.2 (*)    All other components within normal limits  URINE MICROSCOPIC-ADD ON - Abnormal; Notable for the following:    Squamous Epithelial / LPF FEW (*)    All other components within normal limits    Imaging Review Dg Chest 2 View  10/12/2014   CLINICAL DATA:  Pt fell 3days ago and used her left arm to catch her fall. Pt c/o of SOB and unproductive cough since fall.Hx of left sided breast cx and left lung ectomy x7years, COPD, HTN, current smoker 1ppd  EXAM: CHEST  2 VIEW  COMPARISON:  08/17/2012  FINDINGS:  Normal heart, mediastinum hila. Lungs are mildly hyperexpanded but clear. No pleural effusion pneumothorax.  There is mild degenerative disc disease along the thoracic spine. No evidence of a fracture.  IMPRESSION: No active cardiopulmonary disease.   Electronically Signed   By: Lajean Manes M.D.   On: 10/12/2014 14:23   Ct Head Wo Contrast  10/12/2014   CLINICAL DATA:  New left-sided headache.  Nausea.  EXAM: CT HEAD WITHOUT CONTRAST  TECHNIQUE: Contiguous axial images were obtained from the base of the skull through the vertex without intravenous contrast.  COMPARISON:  03/11/2014  FINDINGS: No mass lesion. No midline shift. No acute hemorrhage or hematoma. No extra-axial fluid collections. No evidence of acute infarction. Brain parenchyma is normal. Osseous structures are normal.  IMPRESSION: Normal exam.   Electronically Signed   By: Lorriane Shire M.D.   On: 10/12/2014  15:08     EKG Interpretation None      MDM   Final diagnoses:  Congestion of upper airway  Acute nonintractable headache, unspecified headache type    59 year old female who presents with multiple complaints, including headache, subjective fevers and chills, nausea, congestion, cough with sputum production, and generalized malaise. Overall clinical present dictation seem consistent with likely flulike illness. She is afebrile with stable vital signs. She on exam has an intact neurological exam, benign abdomen, and unremarkable cardiopulmonary exam. Given that these are new daily headaches for this patient the CT head was performed and negative. No evidence of meningismus or other concerning features that would be concerning for meningitis. Headache is intermittent in nature, and not of sudden onset maximal intensity to suggest evidence of subarachnoid hemorrhage. Basic blood work including CBC, comp metabolic profile, and urinalysis are unremarkable. Chest x-ray shows no acute cardiopulmonary processes. She is given IV fluids,  report some symptomatic improvement. Supportive care was discussed for home. She is appropriate for discharge. Strict return and follow-up instructions were reviewed. She expresses understanding of all discharge instructions and felt comfortable with the plan of care.    Forde Dandy, MD 10/12/14 [phone]

## 2014-10-12 NOTE — Discharge Instructions (Signed)
Return without fail for worsening symptoms including worsening pain, confusion, fevers, new vision or speech changes, new weakness or numbness, difficulty breathing, chest pain, or any other symptoms concerning to you.   General Headache Without Cause A general headache is pain or discomfort felt around the head or neck area. The cause may not be found.  HOME CARE   Keep all doctor visits.  Only take medicines as told by your doctor.  Lie down in a dark, quiet room when you have a headache.  Keep a journal to find out if certain things bring on headaches. For example, write down:  What you eat and drink.  How much sleep you get.  Any change to your diet or medicines.  Relax by getting a massage or doing other relaxing activities.  Put ice or heat packs on the head and neck area as told by your doctor.  Lessen stress.  Sit up straight. Do not tighten (tense) your muscles.  Quit smoking if you smoke.  Lessen how much alcohol you drink.  Lessen how much caffeine you drink, or stop drinking caffeine.  Eat and sleep on a regular schedule.  Get 7 to 9 hours of sleep, or as told by your doctor.  Keep lights dim if bright lights bother you or make your headaches worse. GET HELP RIGHT AWAY IF:   Your headache becomes really bad.  You have a fever.  You have a stiff neck.  You have trouble seeing.  Your muscles are weak, or you lose muscle control.  You lose your balance or have trouble walking.  You feel like you will pass out (faint), or you pass out.  You have really bad symptoms that are different than your first symptoms.  You have problems with the medicines given to you by your doctor.  Your medicines do not work.  Your headache feels different than the other headaches.  You feel sick to your stomach (nauseous) or throw up (vomit). MAKE SURE YOU:   Understand these instructions.  Will watch your condition.  Will get help right away if you are not  doing well or get worse. Document Released: 11/27/2007 Document Revised: 05/12/2011 Document Reviewed: 02/07/2011 [facility] Patient Information 2015 [place], [place]. This information is not intended to replace advice given to you by your health care provider. Make sure you discuss any questions you have with your health care provider.

## 2014-10-12 NOTE — ED Notes (Signed)
This RN notes that the Pt walked to the room w/o difficulty.

## 2014-10-12 NOTE — ED Notes (Signed)
Pt c/o intermittent head pain and L arm pain after a fall x 3 days ago, increased urination x "a couple days," and nausea r/t chest congestion x "a while."  Pain score 5/10.  Pt reports taking ibuprofen w/ relief.  Pt reports that her leg "gave out" causing her to fall.  Sts she has chronic issues w/ her legs and is followed by a "vein specialist."

## 2014-11-24 ENCOUNTER — Other Ambulatory Visit: Payer: Self-pay | Admitting: Internal Medicine

## 2015-01-03 ENCOUNTER — Emergency Department (HOSPITAL_COMMUNITY)
Admission: EM | Admit: 2015-01-03 | Discharge: 2015-01-03 | Disposition: A | Discharge status: Home / Self Care | Payer: No Typology Code available for payment source | Attending: Emergency Medicine | Admitting: Emergency Medicine

## 2015-01-03 ENCOUNTER — Encounter (HOSPITAL_COMMUNITY): Payer: Self-pay | Admitting: Emergency Medicine

## 2015-01-03 ENCOUNTER — Emergency Department (HOSPITAL_COMMUNITY): Payer: No Typology Code available for payment source

## 2015-01-03 DIAGNOSIS — Z720 Tobacco use: Secondary | ICD-10-CM | POA: Insufficient documentation

## 2015-01-03 DIAGNOSIS — K219 Gastro-esophageal reflux disease without esophagitis: Secondary | ICD-10-CM

## 2015-01-03 DIAGNOSIS — J449 Chronic obstructive pulmonary disease, unspecified: Secondary | ICD-10-CM | POA: Insufficient documentation

## 2015-01-03 DIAGNOSIS — N644 Mastodynia: Secondary | ICD-10-CM | POA: Insufficient documentation

## 2015-01-03 DIAGNOSIS — Z853 Personal history of malignant neoplasm of breast: Secondary | ICD-10-CM | POA: Insufficient documentation

## 2015-01-03 DIAGNOSIS — Z79899 Other long term (current) drug therapy: Secondary | ICD-10-CM | POA: Insufficient documentation

## 2015-01-03 DIAGNOSIS — E876 Hypokalemia: Secondary | ICD-10-CM

## 2015-01-03 DIAGNOSIS — Z7982 Long term (current) use of aspirin: Secondary | ICD-10-CM | POA: Insufficient documentation

## 2015-01-03 DIAGNOSIS — E785 Hyperlipidemia, unspecified: Secondary | ICD-10-CM | POA: Insufficient documentation

## 2015-01-03 DIAGNOSIS — I10 Essential (primary) hypertension: Secondary | ICD-10-CM | POA: Insufficient documentation

## 2015-01-03 LAB — BASIC METABOLIC PANEL
Anion gap: 5 (ref 5–15)
BUN: 12 mg/dL (ref 6–20)
CO2: 31 mmol/L (ref 22–32)
Calcium: 9.2 mg/dL (ref 8.9–10.3)
Chloride: 105 mmol/L (ref 101–111)
Creatinine, Ser: 0.83 mg/dL (ref 0.44–1.00)
GFR calc Af Amer: >60 mL/min (ref >60)
GFR calc non Af Amer: >60 mL/min (ref >60)
Glucose, Bld: 104 mg/dL — ABNORMAL HIGH (ref 65–99)
Potassium: 2.6 mmol/L — CL (ref 3.5–5.1)
Sodium: 141 mmol/L (ref 135–145)

## 2015-01-03 LAB — CBC
HCT: 37.9 % (ref 36.0–46.0)
Hemoglobin: 12.6 g/dL (ref 12.0–15.0)
MCH: 31.1 pg (ref 26.0–34.0)
MCHC: 33.2 g/dL (ref 30.0–36.0)
MCV: 93.6 fL (ref 78.0–100.0)
Platelets: 205 10*3/uL (ref 150–400)
RBC: 4.05 MIL/uL (ref 3.87–5.11)
RDW: 13.8 % (ref 11.5–15.5)
WBC: 4.3 10*3/uL (ref 4.0–10.5)

## 2015-01-03 LAB — I-STAT TROPONIN, ED: Troponin i, poc: 0 ng/mL (ref 0.00–0.08)

## 2015-01-03 MED ORDER — GABAPENTIN 300 MG PO CAPS: 300.0000 mg | ORAL_CAPSULE | Freq: Every day | ORAL | Status: DC

## 2015-01-03 MED ORDER — POTASSIUM CHLORIDE CRYS ER 20 MEQ PO TBCR
40.0000 meq | EXTENDED_RELEASE_TABLET | Freq: Once | ORAL | Status: AC
  Administered 2015-01-03: 40 meq via ORAL
  Filled 2015-01-03: qty 2

## 2015-01-03 MED ORDER — POTASSIUM CHLORIDE CRYS ER 20 MEQ PO TBCR: 20.0000 meq | EXTENDED_RELEASE_TABLET | Freq: Every day | ORAL | Status: DC

## 2015-01-03 MED ORDER — ATORVASTATIN CALCIUM 20 MG PO TABS: 20.0000 mg | ORAL_TABLET | Freq: Every day | ORAL | Status: DC

## 2015-01-03 MED ORDER — LISINOPRIL-HYDROCHLOROTHIAZIDE 20-12.5 MG PO TABS: 1.0000 | ORAL_TABLET | Freq: Every day | ORAL | Status: DC

## 2015-01-03 MED ORDER — FLUTICASONE-SALMETEROL 100-50 MCG/DOSE IN AEPB: 1.0000 | INHALATION_SPRAY | Freq: Two times a day (BID) | RESPIRATORY_TRACT | Status: DC

## 2015-01-03 MED ORDER — RANITIDINE HCL 150 MG PO CAPS: 150.0000 mg | ORAL_CAPSULE | Freq: Two times a day (BID) | ORAL | Status: DC

## 2015-01-03 NOTE — ED Notes (Signed)
Pt c/o of pain in left breast/chest area.  Pt unable to determine if pain is in just the breast or in chest. Pt states that "pain hurts when you touch the breast like go away".  Pt has PMH breast cancer on left side.

## 2015-01-03 NOTE — ED Provider Notes (Signed)
CSN: [id number]     Arrival date & time 01/03/15  1761 History   First MD Initiated Contact with Patient 01/03/15 [phone]     Chief Complaint  Patient presents with  . Chest Pain  . Breast Pain      HPI Patient has a history of breast cancer status post treatment.  She's been in remission since 2009.  She reports several days of ongoing intermittent sharp pain located to her left breast itself.  She reports her left breast is somewhat tender to palpation.  She denies shortness of breath.  She denies radiation of her pain.  She has no associated shortness of breath nausea vomiting or diaphoresis associated with the pain.  The pain usually resolves once she squeezes her left breast.  She reports it is somewhat more painful to lay on the left side.  She's not noticed any drainage or redness.  No fevers or chills.  No other complaints.   Past Medical History  Diagnosis Date  . Hypertension   . COPD (chronic obstructive pulmonary disease) ([place])   . Hyperlipemia   . Cancer [facility]) 2009    Breast   Past Surgical History  Procedure Laterality Date  . Tubal ligation  1991  . Breast surgery    . Breast lumpectomy Left     s/p radiation therapy   . Appendectomy  1980   Family History  Problem Relation Age of Onset  . Hyperlipidemia Mother   . Hypertension Mother   . Heart disease Mother     Atrial Fib.  . Hyperlipidemia Sister   . Hypertension Sister   . Heart disease Sister     Before age 45,  CHF  . Heart attack Brother   . Hyperlipidemia Brother   . Hypertension Brother   . Hypertension Daughter    Social History  Substance Use Topics  . Smoking status: Current Every Day Smoker -- 0.25 packs/day for 40 years    Types: Cigarettes  . Smokeless tobacco: Never Used  . Alcohol Use: No   OB History    No data available     Review of Systems  All other systems reviewed and are negative.     Allergies  Vicodin  Home Medications   Prior to Admission medications    Medication Sig Start Date End Date Taking? Authorizing Provider  albuterol (PROVENTIL HFA;VENTOLIN HFA) 108 (90 BASE) MCG/ACT inhaler Inhale 2 puffs into the lungs every 6 (six) hours as needed for wheezing. 07/27/14  Yes Lorayne Marek, MD  aspirin 81 MG tablet Take 81 mg by mouth daily.   Yes Historical Provider, MD  atorvastatin (LIPITOR) 20 MG tablet Take 1 tablet (20 mg total) by mouth daily. 07/27/14  Yes Deepak Advani, MD  Fluticasone-Salmeterol (ADVAIR) 100-50 MCG/DOSE AEPB Inhale 1 puff into the lungs 2 (two) times daily. 07/27/14  Yes Lorayne Marek, MD  gabapentin (NEURONTIN) 300 MG capsule TAKE ONE CAPSULE AT BEDTIME Patient taking differently: take 300 mg at bedtime 03/08/14  Yes Deepak Advani, MD  ibuprofen (ADVIL,MOTRIN) 200 MG tablet Take 200-800 mg by mouth every 8 (eight) hours as needed for pain.    Yes Historical Provider, MD  lisinopril-hydrochlorothiazide (PRINZIDE,ZESTORETIC) 20-12.5 MG per tablet Take 1 tablet by mouth daily. 07/27/14  Yes Lorayne Marek, MD  ranitidine (ZANTAC) 150 MG capsule Take 1 capsule (150 mg total) by mouth 2 (two) times daily. 07/27/14  Yes Deepak Advani, MD  clobetasol (TEMOVATE) 0.05 % GEL Apply 1 application topically 2 (two) times  daily. Apply 1 application topically two times daily Patient not taking: Reported on 10/12/2014 10/12/14   Boykin Nearing, MD  clobetasol cream (TEMOVATE) 5.97 % Apply 1 application topically 2 (two) times daily. Patient not taking: Reported on 10/12/2014 09/29/14   Lorayne Marek, MD  potassium chloride SA (K-DUR,KLOR-CON) 20 MEQ tablet Take 1 tablet (20 mEq total) by mouth daily. Patient not taking: Reported on 10/12/2014 09/08/14   Lorayne Marek, MD  Vitamin D, Ergocalciferol, (DRISDOL) 50000 UNITS CAPS capsule TAKE 1 CAPSULE BY MOUTH EVERY 7 DAYS. Patient not taking: Reported on 10/12/2014 09/29/14   Lorayne Marek, MD   BP 149/88 mmHg  Pulse 64  Temp(Src) 98.1 F (36.7 C) (Oral)  Resp 15  SpO2 100% Physical Exam   Constitutional: She is oriented to person, place, and time. She appears well-developed and well-nourished. No distress.  HENT:  Head: Normocephalic and atraumatic.  Eyes: EOM are normal.  Neck: Normal range of motion.  Cardiovascular: Normal rate, regular rhythm and normal heart sounds.   Pulmonary/Chest: Effort normal and breath sounds normal.  Mild tenderness surrounding the left breast near her prior  lumpectomy scar.  No fluctuance or erythema or warmth noted in this area.  Mild possible dimpling of the skin in this region.  No overlying skin changes.  Abdominal: Soft. She exhibits no distension. There is no tenderness.  Musculoskeletal: Normal range of motion.  Neurological: She is alert and oriented to person, place, and time.  Skin: Skin is warm and dry.  Psychiatric: She has a normal mood and affect. Judgment normal.  Nursing note and vitals reviewed.   ED Course  Procedures (including critical care time) Labs Review Labs Reviewed  BASIC METABOLIC PANEL - Abnormal; Notable for the following:    Potassium 2.6 (*)    Glucose, Bld 104 (*)    All other components within normal limits  CBC  I-STAT TROPOININ, ED    Imaging Review Dg Chest 2 View  01/03/2015  CLINICAL DATA:  Cough and chest pain for 3 days EXAM: CHEST  2 VIEW COMPARISON:  October 12, 2014 FINDINGS: There is no edema or consolidation. The heart size and pulmonary vascularity are normal. No adenopathy. There is atherosclerotic calcification in the aortic arch region. No pneumothorax. There is mild degenerative change in the thoracic spine. IMPRESSION: No edema or consolidation. Electronically Signed   By: Lowella Grip III M.D.   On: 01/03/2015 09:47   I have personally reviewed and evaluated these images and lab results as part of my medical decision-making.   EKG Interpretation   Date/Time:  Wednesday January 03 2015 09:36:27 EDT Ventricular Rate:  59 PR Interval:  170 QRS Duration: 93 QT Interval:   428 QTC Calculation: 424 R Axis:   -5 Text Interpretation:  Sinus rhythm Low voltage, precordial leads No  significant change was found Confirmed by Dorthea Maina  MD, Lennette Bihari ([id number]) on  01/03/2015 10:31:57 AM      MDM   Final diagnoses:  None    Patient is to be referred back to her primary care physician for referral to the [facility] [place] for mammography and possible ultrasound given her history of cancer in the discomfort there.  She understands to return to the ER for new or worsening symptoms.  Doubt acute coronary syndrome.    Jola Schmidt, MD 01/03/15 1104

## 2015-03-15 ENCOUNTER — Encounter: Payer: Self-pay | Admitting: Family Medicine

## 2015-03-15 ENCOUNTER — Ambulatory Visit: Payer: BLUE CROSS/BLUE SHIELD | Attending: Family Medicine | Admitting: Family Medicine

## 2015-03-15 VITALS — BP 124/79 | HR 59 | Temp 98.0°F | Resp 16 | Ht 64.0 in | Wt 153.0 lb

## 2015-03-15 DIAGNOSIS — I739 Peripheral vascular disease, unspecified: Secondary | ICD-10-CM | POA: Diagnosis present

## 2015-03-15 DIAGNOSIS — Z7982 Long term (current) use of aspirin: Secondary | ICD-10-CM | POA: Diagnosis not present

## 2015-03-15 DIAGNOSIS — I70209 Unspecified atherosclerosis of native arteries of extremities, unspecified extremity: Secondary | ICD-10-CM | POA: Insufficient documentation

## 2015-03-15 DIAGNOSIS — Z1159 Encounter for screening for other viral diseases: Secondary | ICD-10-CM

## 2015-03-15 DIAGNOSIS — Z6826 Body mass index (BMI) 26.0-26.9, adult: Secondary | ICD-10-CM | POA: Diagnosis not present

## 2015-03-15 DIAGNOSIS — Z720 Tobacco use: Secondary | ICD-10-CM

## 2015-03-15 DIAGNOSIS — Z114 Encounter for screening for human immunodeficiency virus [HIV]: Secondary | ICD-10-CM

## 2015-03-15 DIAGNOSIS — G4700 Insomnia, unspecified: Secondary | ICD-10-CM | POA: Insufficient documentation

## 2015-03-15 DIAGNOSIS — Z0000 Encounter for general adult medical examination without abnormal findings: Secondary | ICD-10-CM

## 2015-03-15 DIAGNOSIS — I70219 Atherosclerosis of native arteries of extremities with intermittent claudication, unspecified extremity: Secondary | ICD-10-CM

## 2015-03-15 DIAGNOSIS — F17200 Nicotine dependence, unspecified, uncomplicated: Secondary | ICD-10-CM

## 2015-03-15 DIAGNOSIS — Z79899 Other long term (current) drug therapy: Secondary | ICD-10-CM | POA: Insufficient documentation

## 2015-03-15 DIAGNOSIS — L84 Corns and callosities: Secondary | ICD-10-CM

## 2015-03-15 DIAGNOSIS — F17210 Nicotine dependence, cigarettes, uncomplicated: Secondary | ICD-10-CM | POA: Insufficient documentation

## 2015-03-15 LAB — COMPLETE METABOLIC PANEL WITH GFR
ALT: 15 U/L (ref 6–29)
AST: 19 U/L (ref 10–35)
Albumin: 4.3 g/dL (ref 3.6–5.1)
Alkaline Phosphatase: 49 U/L (ref 33–130)
BUN: 14 mg/dL (ref 7–25)
CO2: 30 mmol/L (ref 20–31)
Calcium: 9.6 mg/dL (ref 8.6–10.4)
Chloride: 102 mmol/L (ref 98–110)
Creat: 0.73 mg/dL (ref 0.50–1.05)
GFR, Est African American: >89 mL/min (ref >=60)
GFR, Est Non African American: >89 mL/min (ref >=60)
Glucose, Bld: 80 mg/dL (ref 65–99)
Potassium: 3.8 mmol/L (ref 3.5–5.3)
Sodium: 143 mmol/L (ref 135–146)
Total Bilirubin: 0.4 mg/dL (ref 0.2–1.2)
Total Protein: 7.6 g/dL (ref 6.1–8.1)

## 2015-03-15 LAB — MAGNESIUM: Magnesium: 2 mg/dL (ref 1.5–2.5)

## 2015-03-15 LAB — POCT GLYCOSYLATED HEMOGLOBIN (HGB A1C): Hemoglobin A1C: 5.9

## 2015-03-15 MED ORDER — GABAPENTIN 300 MG PO CAPS: 600.0000 mg | ORAL_CAPSULE | Freq: Every day | ORAL | Status: DC

## 2015-03-15 MED ORDER — DIPHENHYDRAMINE HCL 25 MG PO TABS: 25.0000 mg | ORAL_TABLET | Freq: Every evening | ORAL | Status: DC | PRN

## 2015-03-15 MED ORDER — BUPROPION HCL ER (SR) 150 MG PO TB12: 150.0000 mg | ORAL_TABLET | Freq: Two times a day (BID) | ORAL | Status: DC

## 2015-03-15 MED FILL — GABAPENTIN 300 MG CAPSULE: 300 | 30 days supply | Qty: 60 | Fill #0

## 2015-03-15 MED FILL — ?BUPROPION HCL SR 150 MG TA: 150 | 30 days supply | Qty: 60 | Fill #0

## 2015-03-15 MED FILL — ?DIPHENHYDRAMINE 25 MG CAP: 25 | 15 days supply | Qty: 30 | Fill #0

## 2015-03-15 NOTE — Patient Instructions (Addendum)
Brandy Stevenson was seen today for foot swelling.  Diagnoses and all orders for this visit:  Healthcare maintenance -     POCT glycosylated hemoglobin (Hb A1C) -     MM DIGITAL SCREENING BILATERAL; Future -     Ambulatory referral to Gastroenterology  Atherosclerotic PVD with intermittent claudication (HCC) -     gabapentin (NEURONTIN) 300 MG capsule; Take 2 capsules (600 mg total) by mouth at bedtime. -     COMPLETE METABOLIC PANEL WITH GFR -     Magnesium  Insomnia -     diphenhydrAMINE (BENADRYL) 25 MG tablet; Take 1-2 tablets (25-50 mg total) by mouth at bedtime as needed for sleep.  Smoking -     buPROPion (WELLBUTRIN SR) 150 MG 12 hr tablet; Take 1 tablet (150 mg total) by mouth 2 (two) times daily. 150 mg daily for 3 days, then twice daily, take 2nd dose before 5 PM   Smoking cessation support: smoking cessation hotline: 1-800-QUIT-NOW.  Smoking cessation classes are available through [facility]. Call 6780655916 or visit our website at [email or web address]   F/u in 4-6 weeks for physical with pap  Dr. Adrian Blackwater

## 2015-03-15 NOTE — Progress Notes (Signed)
Patient ID: Brandy Stevenson, female   DOB: 1955-03-19, 60 y.o.   MRN: [id number]   Subjective:  Patient ID: Brandy Stevenson, female    DOB: 18-Jun-1955  Age: 60 y.o. MRN: [id number]  CC: Foot Swelling   HPI Aithana Guin presents for    1. Claudication: she has worsening pain in both legs. She has known PAD. She is a smoker. 1/2 PPD. She quit for one month last year. Started smoking again due to stress. She is followed by vascular. She knows she needs to quit and desires to quit smoking.   2. Calluses: on both feet. Non tender. No drainage. Chronic.    Social History  Substance Use Topics  . Smoking status: Current Every Day Smoker -- 0.25 packs/day for 40 years    Types: Cigarettes  . Smokeless tobacco: Never Used  . Alcohol Use: No   Outpatient Prescriptions Prior to Visit  Medication Sig Dispense Refill  . aspirin 81 MG tablet Take 81 mg by mouth daily.    [name] atorvastatin (LIPITOR) 20 MG tablet Take 1 tablet (20 mg total) by mouth daily. 90 tablet 3  . Fluticasone-Salmeterol (ADVAIR) 100-50 MCG/DOSE AEPB Inhale 1 puff into the lungs 2 (two) times daily. 180 each 3  . gabapentin (NEURONTIN) 300 MG capsule Take 1 capsule (300 mg total) by mouth at bedtime. 90 capsule 0  . ibuprofen (ADVIL,MOTRIN) 200 MG tablet Take 200-800 mg by mouth every 8 (eight) hours as needed for pain.     [name] lisinopril-hydrochlorothiazide (PRINZIDE,ZESTORETIC) 20-12.5 MG tablet Take 1 tablet by mouth daily. 30 tablet 2  . ranitidine (ZANTAC) 150 MG capsule Take 1 capsule (150 mg total) by mouth 2 (two) times daily. 60 capsule 3  . albuterol (PROVENTIL HFA;VENTOLIN HFA) 108 (90 BASE) MCG/ACT inhaler Inhale 2 puffs into the lungs every 6 (six) hours as needed for wheezing. (Patient not taking: Reported on 03/15/2015) 3 Inhaler 12  . atorvastatin (LIPITOR) 20 MG tablet TAKE 1 TABLET BY MOUTH ONCE DAILY (Patient not taking: Reported on 03/15/2015) 90 tablet 3  . potassium chloride SA (K-DUR,KLOR-CON) 20 MEQ tablet  Take 1 tablet (20 mEq total) by mouth daily. (Patient not taking: Reported on 03/15/2015) 10 tablet 0   No facility-administered medications prior to visit.    ROS Review of Systems  Constitutional: Negative for fever and chills.  Eyes: Negative for visual disturbance.  Respiratory: Negative for shortness of breath.   Cardiovascular: Negative for chest pain.  Gastrointestinal: Negative for abdominal pain and blood in stool.  Musculoskeletal: Negative for back pain and arthralgias.  Skin: Negative for rash.  Allergic/Immunologic: Negative for immunocompromised state.  Hematological: Negative for adenopathy. Does not bruise/bleed easily.  Psychiatric/Behavioral: Negative for suicidal ideas and dysphoric mood.    Objective:  BP 124/79 mmHg  Pulse 59  Temp(Src) 98 F (36.7 C) (Oral)  Resp 16  Ht 5\' 4"  (1.626 m)  Wt 153 lb (69.4 kg)  BMI 26.25 kg/m2  SpO2 100%  BP/Weight 03/15/2015 01/03/2015 [id number]  Systolic BP A999333 [id number] A999333  Diastolic BP 79 86 99  Wt. (Lbs) 153 - -  BMI 26.25 - -    Physical Exam  Constitutional: She is oriented to person, place, and time. She appears well-developed and well-nourished. No distress.  HENT:  Head: Normocephalic and atraumatic.  Cardiovascular: Normal rate, regular rhythm, normal heart sounds and intact distal pulses.  Exam reveals decreased pulses.   Pulmonary/Chest: Effort normal and breath sounds normal.  Musculoskeletal: She exhibits no edema.  Neurological:  She is alert and oriented to person, place, and time.  Skin: Skin is warm and dry. No rash noted.  Plantar calluses on both feet   Psychiatric: She has a normal mood and affect.   Lab Results  Component Value Date   HGBA1C 5.90 03/15/2015    Assessment & Plan:   Problem List Items Addressed This Visit    Atherosclerotic PVD with intermittent claudication (HCC) (Chronic)   Relevant Medications   gabapentin (NEURONTIN) 300 MG capsule   Other Relevant Orders   COMPLETE  METABOLIC PANEL WITH GFR   Magnesium   Insomnia (Chronic)   Relevant Medications   diphenhydrAMINE (BENADRYL) 25 MG tablet   Smoking (Chronic)   Relevant Medications   buPROPion (WELLBUTRIN SR) 150 MG 12 hr tablet    Other Visit Diagnoses    Healthcare maintenance    -  Primary    Relevant Orders    POCT glycosylated hemoglobin (Hb A1C) (Completed)    MM DIGITAL SCREENING BILATERAL    Ambulatory referral to Gastroenterology    Screening for HIV (human immunodeficiency virus)        Relevant Orders    HIV antibody (with reflex)    Need for hepatitis C screening test        Relevant Orders    Hepatitis C antibody, reflex    Callus of foot        Relevant Orders    Ambulatory referral to Podiatry       No orders of the defined types were placed in this encounter.    Follow-up: No Follow-up on file.   Brandy Nearing MD

## 2015-03-15 NOTE — Progress Notes (Signed)
F/U black spot on rt foot  Pain to walk and swelling  No pain today  Tobacco user 1/2 ppday  No suicidal thought in the past two weeks

## 2015-03-15 NOTE — Assessment & Plan Note (Signed)
A: smoker with PAD P: Smoking cessation wellbutrin

## 2015-03-16 LAB — HEPATITIS C ANTIBODY: HCV Ab: NEGATIVE

## 2015-03-16 LAB — HIV ANTIBODY (ROUTINE TESTING W REFLEX): HIV 1&2 Ab, 4th Generation: NONREACTIVE

## 2015-03-19 ENCOUNTER — Other Ambulatory Visit: Payer: Self-pay | Admitting: Family Medicine

## 2015-03-19 DIAGNOSIS — Z853 Personal history of malignant neoplasm of breast: Secondary | ICD-10-CM

## 2015-03-19 MED FILL — LISINOPRIL-HCTZ 20-12.5 MG: 20-12.5 | 30 days supply | Qty: 30 | Fill #0

## 2015-03-19 MED FILL — ATORVASTATIN 20 MG TABLET: 20 | 30 days supply | Qty: 30 | Fill #0

## 2015-03-20 ENCOUNTER — Telehealth: Payer: Self-pay

## 2015-03-20 NOTE — Telephone Encounter (Signed)
Spoke with patient this am  And she is aware of her lab results

## 2015-03-20 NOTE — Telephone Encounter (Signed)
-----   Message from Boykin Nearing, MD sent at 03/16/2015  8:29 AM EST ----- Screening HIV and Hep C negative Normal CMP  Normal magnesium level

## 2015-03-22 MED FILL — VENTOLIN HFA 90 MCG INHALER: 108 (90 BAS | 30 days supply | Qty: 18 | Fill #0

## 2015-03-26 ENCOUNTER — Ambulatory Visit
Admission: RE | Admit: 2015-03-26 | Discharge: 2015-03-26 | Disposition: A | Discharge status: Home / Self Care | Payer: BLUE CROSS/BLUE SHIELD | Source: Ambulatory Visit | Attending: Family Medicine | Admitting: Family Medicine

## 2015-03-26 ENCOUNTER — Other Ambulatory Visit: Payer: Self-pay | Admitting: Family Medicine

## 2015-03-26 DIAGNOSIS — Z853 Personal history of malignant neoplasm of breast: Secondary | ICD-10-CM

## 2015-04-04 ENCOUNTER — Encounter: Payer: Self-pay | Admitting: Podiatry

## 2015-04-04 ENCOUNTER — Encounter: Payer: Self-pay | Admitting: Family

## 2015-04-04 ENCOUNTER — Ambulatory Visit (INDEPENDENT_AMBULATORY_CARE_PROVIDER_SITE_OTHER): Payer: BLUE CROSS/BLUE SHIELD | Admitting: Podiatry

## 2015-04-04 VITALS — BP 146/86 | HR 69 | Resp 12

## 2015-04-04 DIAGNOSIS — R0989 Other specified symptoms and signs involving the circulatory and respiratory systems: Secondary | ICD-10-CM

## 2015-04-04 DIAGNOSIS — Q828 Other specified congenital malformations of skin: Secondary | ICD-10-CM | POA: Diagnosis not present

## 2015-04-04 NOTE — Progress Notes (Signed)
   Subjective:    Patient ID: Brandy Stevenson, female    DOB: December 09, 1955, 60 y.o.   MRN: [id number]  HPI   This patient presents today complaining of a three-month history of painful skin lesions 2 on the plantar aspect of right foot aggravated with standing walking relieved with rest. Patient has attempted this soak right foot to reduce symptoms, however, upon weight-bearing the still quite uncomfortable becoming progressively more uncomfortable over time.  Patient has a history of claudication and cramping with walking which reduces her standing walking and states that she has a pending vascular evaluation on 04/10/2015 for evaluation of this problem  Review of Systems  Skin: Positive for color change.       Objective:   Physical Exam  Patient appears orientated 3 with granddaughter present and treatment room  Vascular: No calf edema bilaterally No calf tenderness bilaterally DP and PT pulses 0/4 bilaterally Capillary reflex delayed bilaterally  Neurological: Sensation to 10 g monofilament wire intact 5/5 bilaterally Vibratory sensation reactive bilaterally Ankle reflex equal and reactive bilaterally  Dermatological: No open skin lesions bilaterally two nucleated plantar keratoses plantar mid arch right which are patient's area of concern  Musculoskeletal: There is no pain or crepitus upon range of motion ankle, subtalar, midtarsal joints bilaterally       Assessment & Plan:   Assessment: Nonpalpable pedal pulses consistent with known history of peripheral arterial disease and pending vascular examination by vascular Dr. Osvaldo Angst 2  Plan: Today review the results of examination with patient today made aware that her circulation in her feet needed evaluation as prescribed with pending vascular exam on 04/10/2015.  I made patient aware the skin lesions were chronic and should be debrided at patient's request maintain comfort Porokeratosis 2 are debrided  without any bleeding  Reappoint at patient's request

## 2015-04-04 NOTE — Patient Instructions (Addendum)
your foot examination demonstrated decreased pulsation or possible decreased circulation in your feet with pending vascular examination on 04/10/2015 The corn like growths on the bottom of your right foot are a primary skin problem and will need  trimimg as needed to remain comfortable

## 2015-04-05 ENCOUNTER — Other Ambulatory Visit: Payer: Self-pay | Admitting: Internal Medicine

## 2015-04-05 MED FILL — raNITIdine HCL 150 MG TABS: 150 | 30 days supply | Qty: 60 | Fill #3

## 2015-04-10 ENCOUNTER — Ambulatory Visit (HOSPITAL_COMMUNITY)
Admission: RE | Admit: 2015-04-10 | Discharge: 2015-04-10 | Disposition: A | Discharge status: Home / Self Care | Payer: BLUE CROSS/BLUE SHIELD | Source: Ambulatory Visit | Attending: Family | Admitting: Family

## 2015-04-10 ENCOUNTER — Ambulatory Visit (INDEPENDENT_AMBULATORY_CARE_PROVIDER_SITE_OTHER): Payer: BLUE CROSS/BLUE SHIELD | Admitting: Family

## 2015-04-10 ENCOUNTER — Encounter: Payer: Self-pay | Admitting: Family

## 2015-04-10 VITALS — BP 129/89 | HR 59 | Temp 97.2°F | Resp 16 | Ht 65.0 in | Wt 148.0 lb

## 2015-04-10 DIAGNOSIS — Z720 Tobacco use: Secondary | ICD-10-CM | POA: Diagnosis not present

## 2015-04-10 DIAGNOSIS — I10 Essential (primary) hypertension: Secondary | ICD-10-CM | POA: Insufficient documentation

## 2015-04-10 DIAGNOSIS — I739 Peripheral vascular disease, unspecified: Secondary | ICD-10-CM | POA: Diagnosis not present

## 2015-04-10 DIAGNOSIS — R29898 Other symptoms and signs involving the musculoskeletal system: Secondary | ICD-10-CM | POA: Diagnosis not present

## 2015-04-10 DIAGNOSIS — F17200 Nicotine dependence, unspecified, uncomplicated: Secondary | ICD-10-CM

## 2015-04-10 DIAGNOSIS — I779 Disorder of arteries and arterioles, unspecified: Secondary | ICD-10-CM

## 2015-04-10 DIAGNOSIS — E785 Hyperlipidemia, unspecified: Secondary | ICD-10-CM | POA: Insufficient documentation

## 2015-04-10 NOTE — Patient Instructions (Signed)
Peripheral Vascular Disease Peripheral vascular disease (PVD) is a disease of the blood vessels that are not part of your heart and brain. A simple term for PVD is poor circulation. In most cases, PVD narrows the blood vessels that carry blood from your heart to the rest of your body. This can result in a decreased supply of blood to your arms, legs, and internal organs, like your stomach or kidneys. However, it most often affects a person's lower legs and feet. There are two types of PVD.  Organic PVD. This is the more common type. It is caused by damage to the structure of blood vessels.  Functional PVD. This is caused by conditions that make blood vessels contract and tighten (spasm). Without treatment, PVD tends to get worse over time. PVD can also lead to acute ischemic limb. This is when an arm or limb suddenly has trouble getting enough blood. This is a medical emergency. CAUSES Each type of PVD has many different causes. The most common cause of PVD is buildup of a fatty material (plaque) inside of your arteries (atherosclerosis). Small amounts of plaque can break off from the walls of the blood vessels and become lodged in a smaller artery. This blocks blood flow and can cause acute ischemic limb. Other common causes of PVD include:  Blood clots that form inside of blood vessels.  Injuries to blood vessels.  Diseases that cause inflammation of blood vessels or cause blood vessel spasms.  Health behaviors and health history that increase your risk of developing PVD. RISK FACTORS  You may have a greater risk of PVD if you:  Have a family history of PVD.  Have certain medical conditions, including:  High cholesterol.  Diabetes.  High blood pressure (hypertension).  Coronary heart disease.  Past problems with blood clots.  Past injury, such as burns or a broken bone. These may have damaged blood vessels in your limbs.  Buerger disease. This is caused by inflamed blood  vessels in your hands and feet.  Some forms of arthritis.  Rare birth defects that affect the arteries in your legs.  Use tobacco.  Do not get enough exercise.  Are obese.  Are age 50 or older. SIGNS AND SYMPTOMS  PVD may cause many different symptoms. Your symptoms depend on what part of your body is not getting enough blood. Some common signs and symptoms include:  Cramps in your lower legs. This may be a symptom of poor leg circulation (claudication).  Pain and weakness in your legs while you are physically active that goes away when you rest (intermittent claudication).  Leg pain when at rest.  Leg numbness, tingling, or weakness.  Coldness in a leg or foot, especially when compared with the other leg.  Skin or hair changes. These can include:  Hair loss.  Shiny skin.  Pale or bluish skin.  Thick toenails.  Inability to get or maintain an erection (erectile dysfunction). People with PVD are more prone to developing ulcers and sores on their toes, feet, or legs. These may take longer than normal to heal. DIAGNOSIS Your health care provider may diagnose PVD from your signs and symptoms. The health care provider will also do a physical exam. You may have tests to find out what is causing your PVD and determine its severity. Tests may include:  Blood pressure recordings from your arms and legs and measurements of the strength of your pulses (pulse volume recordings).  Imaging studies using sound waves to take pictures of   the blood flow through your blood vessels (Doppler ultrasound).  Injecting a dye into your blood vessels before having imaging studies using:  X-rays (angiogram or arteriogram).  Computer-generated X-rays (CT angiogram).  A powerful electromagnetic field and a computer (magnetic resonance angiogram or MRA). TREATMENT Treatment for PVD depends on the cause of your condition and the severity of your symptoms. It also depends on your age. Underlying  causes need to be treated and controlled. These include long-lasting (chronic) conditions, such as diabetes, high cholesterol, and high blood pressure. You may need to first try making lifestyle changes and taking medicines. Surgery may be needed if these do not work. Lifestyle changes may include:  Quitting smoking.  Exercising regularly.  Following a low-fat, low-cholesterol diet. Medicines may include:  Blood thinners to prevent blood clots.  Medicines to improve blood flow.  Medicines to improve your blood cholesterol levels. Surgical procedures may include:  A procedure that uses an inflated balloon to open a blocked artery and improve blood flow (angioplasty).  A procedure to put in a tube (stent) to keep a blocked artery open (stent implant).  Surgery to reroute blood flow around a blocked artery (peripheral bypass surgery).  Surgery to remove dead tissue from an infected wound on the affected limb.  Amputation. This is surgical removal of the affected limb. This may be necessary in cases of acute ischemic limb that are not improved through medical or surgical treatments. HOME CARE INSTRUCTIONS  Take medicines only as directed by your health care provider.  Do not use any tobacco products, including cigarettes, chewing tobacco, or electronic cigarettes. If you need help quitting, ask your health care provider.  Lose weight if you are overweight, and maintain a healthy weight as directed by your health care provider.  Eat a diet that is low in fat and cholesterol. If you need help, ask your health care provider.  Exercise regularly. Ask your health care provider to suggest some good activities for you.  Use compression stockings or other mechanical devices as directed by your health care provider.  Take good care of your feet.  Wear comfortable shoes that fit well.  Check your feet often for any cuts or sores. SEEK MEDICAL CARE IF:  You have cramps in your legs  while walking.  You have leg pain when you are at rest.  You have coldness in a leg or foot.  Your skin changes.  You have erectile dysfunction.  You have cuts or sores on your feet that are not healing. SEEK IMMEDIATE MEDICAL CARE IF:  Your arm or leg turns cold and blue.  Your arms or legs become red, warm, swollen, painful, or numb.  You have chest pain or trouble breathing.  You suddenly have weakness in your face, arm, or leg.  You become very confused or lose the ability to speak.  You suddenly have a very bad headache or lose your vision.   This information is not intended to replace advice given to you by your health care provider. Make sure you discuss any questions you have with your health care provider.   Document Released: 03/27/2004 Document Revised: 03/10/2014 Document Reviewed: 07/28/2013 Elsevier Interactive Patient Education 2016 [employer]  

## 2015-04-10 NOTE — Progress Notes (Signed)
[facility] [place] HISTORY AND PHYSICAL -PAD  History of Present Illness Brandy Stevenson is a 60 y.o. female patient of Brandy Stevenson who returns for follow up of bilateral lower extremity PAD and known iliac artery stenosis.  Pt has not had previous peripheral vascular intervention. She has no history of nonhealing ulcers or infection.   The tired feeling in her legs starts in her thighs, travels distally to her calves, also described as heavy feeling in feet. Her lower legs feel cold all of the time. She denies non healing wounds but does have dry skin cracking at backs of both heels, no fissures. Her feet "ball up" at night.   She has a history of left breast lumpectomy for breast cancer.  She denies any history of stroke or TIA, denies hx of MI or heart disease. She states she has back pain with bending over and the same tired feeling in legs with bending at the waist. Her legs give way at times.. She was under evaluation of her back which was halted when left breast cancer found in 2009.  Her PCP is Brandy Stevenson.  The patient reports New Medical or Surgical History: started gabapentin which has helped pain in legs.  She could not afford the co-pay to see Brandy Stevenson, neurosurgeon to whom she was referred for evaluation of known back problems.  Pt states she now has a different health insurance. She states many years ago she was told that she had a deteriorating disc in her back. She was also diagnosed a few years back with osteoporosis.    Pt Diabetic: No Pt smoker:  Smoker, 5 cigarettes/day, 40+ yrs)  Pt meds include: Statin :yes ASA: Yes Other anticoagulants/antiplatelets: no   Past Medical History  Diagnosis Date  . Hypertension   . COPD (chronic obstructive pulmonary disease) ([place])   . Hyperlipemia   . Cancer [facility]) 2009    Breast  . Peripheral vascular disease [facility])     Social History Social History  Substance Use Topics  .  Smoking status: Light Tobacco Smoker -- 0.25 packs/day for 40 years    Types: Cigarettes  . Smokeless tobacco: Never Used  . Alcohol Use: No    Family History Family History  Problem Relation Age of Onset  . Hyperlipidemia Mother   . Hypertension Mother   . Heart disease Mother     Atrial Fib.  . Hyperlipidemia Sister   . Hypertension Sister   . Heart disease Sister     Before age 26,  CHF  . Heart attack Brother   . Hyperlipidemia Brother   . Hypertension Brother   . Hypertension Daughter     Past Surgical History  Procedure Laterality Date  . Tubal ligation  1991  . Breast surgery    . Breast lumpectomy Left     s/p radiation therapy   . Appendectomy  1980    Allergies  Allergen Reactions  . Vicodin [Hydrocodone-Acetaminophen] Nausea And Vomiting and Other (See Comments)    Sweating    Current Outpatient Prescriptions  Medication Sig Dispense Refill  . albuterol (PROVENTIL HFA;VENTOLIN HFA) 108 (90 BASE) MCG/ACT inhaler Inhale 2 puffs into the lungs every 6 (six) hours as needed for wheezing. 3 Inhaler 12  . aspirin 81 MG tablet Take 81 mg by mouth daily.    [name] atorvastatin (LIPITOR) 20 MG tablet Take 1 tablet (20 mg total) by mouth daily. 90 tablet 3  . buPROPion (WELLBUTRIN SR) 150 MG  12 hr tablet Take 1 tablet (150 mg total) by mouth 2 (two) times daily. 150 mg daily for 3 days, then twice daily, take 2nd dose before 5 PM 60 tablet 1  . diphenhydrAMINE (BENADRYL) 25 mg capsule   0  . diphenhydrAMINE (BENADRYL) 25 MG tablet Take 1-2 tablets (25-50 mg total) by mouth at bedtime as needed for sleep. 30 tablet 0  . Fluticasone-Salmeterol (ADVAIR) 100-50 MCG/DOSE AEPB Inhale 1 puff into the lungs 2 (two) times daily. 180 each 3  . gabapentin (NEURONTIN) 300 MG capsule Take 2 capsules (600 mg total) by mouth at bedtime. 60 capsule 5  . ibuprofen (ADVIL,MOTRIN) 200 MG tablet Take 200-800 mg by mouth every 8 (eight) hours as needed for pain.     [name]  lisinopril-hydrochlorothiazide (PRINZIDE,ZESTORETIC) 20-12.5 MG tablet Take 1 tablet by mouth daily. 30 tablet 2  . ranitidine (ZANTAC) 150 MG tablet TAKE 1 TABLET BY MOUTH 2 TIMES DAILY. 60 tablet 2   No current facility-administered medications for this visit.    ROS: See HPI for pertinent positives and negatives.   Physical Examination  Filed Vitals:   04/10/15 1329  BP: 129/89  Pulse: 59  Temp: 97.2 F (36.2 C)  TempSrc: Oral  Resp: 16  Height: 5\' 5"  (1.651 m)  Weight: 148 lb (67.132 kg)  SpO2: 99%   Body mass index is 24.63 kg/(m^2).  General: A&O x 3, WDWN. Gait: slow, deliberate Eyes: PERRLA. Pulmonary: CTAB, without wheezes , rales or rhonchi. Cardiac: regular rhythm, no detected murmur.     Carotid Bruits Right Left   Negative Negative  Aorta is not palpable. Radial pulses: 2+ palpable and =   VASCULAR EXAM: Extremities without ischemic changes  without Gangrene; without open wounds. Dry cracking skin at posterior aspect of heels, no fissures.     LE Pulses Right Left   FEMORAL Not palpable 1+ palpable    POPLITEAL not palpable  not palpable   POSTERIOR TIBIAL not palpable  not palpable    DORSALIS PEDIS  ANTERIOR TIBIAL not palpable  not palpable    Abdomen: soft, NT, no palpable masses. Skin: no rashes, no ulcers. See extremities. Acanthosis nigricans at neck. Musculoskeletal: no muscle wasting or atrophy. Neurologic: A&O X 3; Appropriate Affect ; SENSATION: normal; MOTOR FUNCTION: moving all extremities equally, motor strength 5/5 throughout in UE's, 3/5 in LE's. Speech is fluent/normal. CN 2-12 grossly intact. Positive straight leg raise test in both legs.           Non-Invasive Vascular  Imaging: DATE: 04/10/2015 ABI (Date: 04/10/2015)  R: 0.60 (0.72, 04/04/14), DP: mono, PT: mono, TBI: 0.45  L: 0.59 (0.82), DP: mono, PT: mono, TBI: 0.41   ASSESSMENT: Brandy Stevenson is a 60 y.o. female female who presents with PAD, known iliac artery disease, has no signs of ischemia in her feet/legs.  She has a constellation of symptoms in her legs that are not primarily classic for claudication, even though she does have moderate bilateral arterial occlusive disease in her legs, there seems to be a significant neurological component. The symptoms in her legs are life limiting. I discussed the above with Brandy Stevenson and pt's ABI results. Will refer to neurosurgeon to continue evaluation of back issues that was halted in 2009 when breast cancer was diagnosed. If no neurological etiology for her leg sx's is found, will consider arteriogram to evaluate aortoiliac and lower extremity arterial perfusion.  She denies a known diagnosis of DM; unfortunately she resumed smoking.  Face to  face time with patient was 25 minutes. Over 50% of this time was spent on counseling and coordination of care.    PLAN:  The patient was counseled re smoking cessation and given several free resources re smoking cessation.   Referral back to Dr. Erline Levine, since pt has different health insurance, for evaluation of neurological sx's relating to her legs since her arterial occlusive disease is moderate and does not account for the severe life limiting sx's she is having in her legs.  She has a remote hx of back issues; evaluation of this was halted in 2009 when breast cancer was diagnosed.  Based on the patient's vascular studies and examination, pt will return to clinic in 6 months with ABI's.   I discussed in depth with the patient the nature of atherosclerosis, and emphasized the importance of maximal medical management including strict control of blood pressure, blood glucose, and lipid levels, obtaining regular  exercise, and cessation of smoking.  The patient is aware that without maximal medical management the underlying atherosclerotic disease process will progress, limiting the benefit of any interventions.  The patient was given information about PAD including signs, symptoms, treatment, what symptoms should prompt the patient to seek immediate medical care, and risk reduction measures to take.  Clemon Chambers, RN, MSN, FNP-C [facility] Phone: [phone]  Clinic MD: Kellie Stevenson  04/10/2015 1:48 PM

## 2015-04-17 ENCOUNTER — Other Ambulatory Visit: Payer: BLUE CROSS/BLUE SHIELD | Admitting: Family Medicine

## 2015-04-17 MED FILL — ATORVASTATIN 20 MG TABLET: 20 | 30 days supply | Qty: 30 | Fill #1

## 2015-04-17 MED FILL — LISINOPRIL-HCTZ 20-12.5 MG: 20-12.5 | 30 days supply | Qty: 30 | Fill #1

## 2015-05-01 ENCOUNTER — Encounter: Payer: Self-pay | Admitting: Clinical

## 2015-05-01 NOTE — Progress Notes (Signed)
Depression screen [facility] 2/9 03/15/2015 04/12/2014  Decreased Interest 1 2  Down, Depressed, Hopeless 1 2  PHQ - 2 Score 2 4  Altered sleeping 3 2  Tired, decreased energy 3 2  Change in appetite 2 2  Feeling bad or failure about yourself  0 0  Trouble concentrating 2 0  Moving slowly or fidgety/restless - 0  Suicidal thoughts 0 0  PHQ-9 Score 12 10  03-14-14: PHQ9- 13 "Moving slowly or fidgety/restless"=1  GAD 7 : Generalized Anxiety Score 03/15/2015  Nervous, Anxious, on Edge 0  Control/stop worrying 1  Worry too much - different things 1  Trouble relaxing 3  Restless 1  Easily annoyed or irritable 1  Afraid - awful might happen 0  Total GAD 7 Score 7

## 2015-05-03 ENCOUNTER — Encounter: Payer: Self-pay | Admitting: Family Medicine

## 2015-05-03 ENCOUNTER — Ambulatory Visit: Payer: BLUE CROSS/BLUE SHIELD | Attending: Family Medicine | Admitting: Family Medicine

## 2015-05-03 VITALS — BP 128/80 | HR 60 | Temp 98.1°F | Resp 16 | Ht 65.0 in | Wt 151.0 lb

## 2015-05-03 DIAGNOSIS — Z124 Encounter for screening for malignant neoplasm of cervix: Secondary | ICD-10-CM

## 2015-05-03 DIAGNOSIS — J449 Chronic obstructive pulmonary disease, unspecified: Secondary | ICD-10-CM

## 2015-05-03 DIAGNOSIS — Z1272 Encounter for screening for malignant neoplasm of vagina: Secondary | ICD-10-CM | POA: Insufficient documentation

## 2015-05-03 DIAGNOSIS — Z79899 Other long term (current) drug therapy: Secondary | ICD-10-CM | POA: Diagnosis not present

## 2015-05-03 DIAGNOSIS — Z7982 Long term (current) use of aspirin: Secondary | ICD-10-CM | POA: Insufficient documentation

## 2015-05-03 DIAGNOSIS — Z888 Allergy status to other drugs, medicaments and biological substances status: Secondary | ICD-10-CM | POA: Insufficient documentation

## 2015-05-03 DIAGNOSIS — F17210 Nicotine dependence, cigarettes, uncomplicated: Secondary | ICD-10-CM | POA: Diagnosis not present

## 2015-05-03 MED ORDER — FLUTICASONE-SALMETEROL 100-50 MCG/DOSE IN AEPB: 1.0000 | INHALATION_SPRAY | Freq: Two times a day (BID) | RESPIRATORY_TRACT | Status: DC

## 2015-05-03 MED ORDER — ALBUTEROL SULFATE HFA 108 (90 BASE) MCG/ACT IN AERS: 2.0000 | INHALATION_SPRAY | Freq: Four times a day (QID) | RESPIRATORY_TRACT | Status: DC | PRN

## 2015-05-03 NOTE — Patient Instructions (Signed)
Diagnoses and all orders for this visit:  Pap smear for cervical cancer screening -     Cytology - PAP   Pap results will be sent to mychart  F/u in 1-3 weeks for disability evaluation exam, needs 30 minutes  Bring your paperwork along  Dr. Adrian Blackwater

## 2015-05-03 NOTE — Progress Notes (Signed)
SUBJECTIVE:  60 y.o. female for Pap:   Social History  Substance Use Topics  . Smoking status: Light Tobacco Smoker -- 0.25 packs/day for 40 years    Types: Cigarettes  . Smokeless tobacco: Never Used  . Alcohol Use: No   Current Outpatient Prescriptions  Medication Sig Dispense Refill  . albuterol (PROVENTIL HFA;VENTOLIN HFA) 108 (90 BASE) MCG/ACT inhaler Inhale 2 puffs into the lungs every 6 (six) hours as needed for wheezing. 3 Inhaler 12  . aspirin 81 MG tablet Take 81 mg by mouth daily.    [name] atorvastatin (LIPITOR) 20 MG tablet Take 1 tablet (20 mg total) by mouth daily. 90 tablet 3  . buPROPion (WELLBUTRIN SR) 150 MG 12 hr tablet Take 1 tablet (150 mg total) by mouth 2 (two) times daily. 150 mg daily for 3 days, then twice daily, take 2nd dose before 5 PM 60 tablet 1  . diphenhydrAMINE (BENADRYL) 25 mg capsule   0  . diphenhydrAMINE (BENADRYL) 25 MG tablet Take 1-2 tablets (25-50 mg total) by mouth at bedtime as needed for sleep. 30 tablet 0  . Fluticasone-Salmeterol (ADVAIR) 100-50 MCG/DOSE AEPB Inhale 1 puff into the lungs 2 (two) times daily. 180 each 3  . gabapentin (NEURONTIN) 300 MG capsule Take 2 capsules (600 mg total) by mouth at bedtime. 60 capsule 5  . ibuprofen (ADVIL,MOTRIN) 200 MG tablet Take 200-800 mg by mouth every 8 (eight) hours as needed for pain.     [name] lisinopril-hydrochlorothiazide (PRINZIDE,ZESTORETIC) 20-12.5 MG tablet Take 1 tablet by mouth daily. 30 tablet 2  . ranitidine (ZANTAC) 150 MG tablet TAKE 1 TABLET BY MOUTH 2 TIMES DAILY. 60 tablet 2   No current facility-administered medications for this visit.   Allergies: Vicodin  No LMP recorded. Patient is postmenopausal.  ROS:  No pelvic pain or vaginal discharge. No pain with sex. Has chronic leg pain.   OBJECTIVE:  The patient appears well, alert, oriented x 3, in no distress. BP 128/80 mmHg  Pulse 60  Temp(Src) 98.1 F (36.7 C) (Oral)  Resp 16  Ht 5\' 5"  (1.651 m)  Wt 151 lb (68.493 kg)  BMI  25.13 kg/m2  SpO2 99% Chest: normal WOB   BREAST EXAM: breasts appear normal, no suspicious masses, no skin or nipple changes or axillary nodes, not examined  PELVIC EXAM: normal external genitalia, vulva, vagina, cervix, uterus and adnexa  ASSESSMENT:  well woman  PLAN:  pap smear

## 2015-05-03 NOTE — Progress Notes (Signed)
Annual pap smear No vaginal discharge no odor Sexually active, no pain with intercourse  No pain today  Tobacco user 5 cigarette  No suicidal thought in the past two weeks

## 2015-05-04 LAB — CERVICOVAGINAL ANCILLARY ONLY
Chlamydia: NEGATIVE
Neisseria Gonorrhea: NEGATIVE
Wet Prep (BD Affirm): NEGATIVE

## 2015-05-04 LAB — CYTOLOGY - PAP

## 2015-05-17 MED FILL — ATORVASTATIN 20 MG TABLET: 20 | 30 days supply | Qty: 30 | Fill #2

## 2015-05-17 MED FILL — raNITIdine HCL 150 MG TABS: 150 | 30 days supply | Qty: 60 | Fill #0

## 2015-05-17 MED FILL — VENTOLIN HFA 90 MCG INHALER: 108 (90 BAS | 30 days supply | Qty: 18 | Fill #0

## 2015-05-17 MED FILL — LISINOPRIL-HCTZ 20-12.5 MG: 20-12.5 | 30 days supply | Qty: 30 | Fill #2

## 2015-05-22 ENCOUNTER — Encounter: Payer: Self-pay | Admitting: Family Medicine

## 2015-05-22 ENCOUNTER — Ambulatory Visit (HOSPITAL_BASED_OUTPATIENT_CLINIC_OR_DEPARTMENT_OTHER): Payer: BLUE CROSS/BLUE SHIELD | Admitting: Clinical

## 2015-05-22 ENCOUNTER — Ambulatory Visit: Payer: BLUE CROSS/BLUE SHIELD | Attending: Family Medicine | Admitting: Family Medicine

## 2015-05-22 VITALS — BP 107/68 | HR 73 | Temp 98.3°F | Resp 16 | Ht 65.0 in | Wt 151.0 lb

## 2015-05-22 DIAGNOSIS — F17210 Nicotine dependence, cigarettes, uncomplicated: Secondary | ICD-10-CM | POA: Insufficient documentation

## 2015-05-22 DIAGNOSIS — J449 Chronic obstructive pulmonary disease, unspecified: Secondary | ICD-10-CM | POA: Insufficient documentation

## 2015-05-22 DIAGNOSIS — E785 Hyperlipidemia, unspecified: Secondary | ICD-10-CM | POA: Diagnosis not present

## 2015-05-22 DIAGNOSIS — I10 Essential (primary) hypertension: Secondary | ICD-10-CM

## 2015-05-22 DIAGNOSIS — F418 Other specified anxiety disorders: Secondary | ICD-10-CM | POA: Diagnosis not present

## 2015-05-22 DIAGNOSIS — J3489 Other specified disorders of nose and nasal sinuses: Secondary | ICD-10-CM

## 2015-05-22 DIAGNOSIS — Z79899 Other long term (current) drug therapy: Secondary | ICD-10-CM | POA: Diagnosis not present

## 2015-05-22 DIAGNOSIS — I739 Peripheral vascular disease, unspecified: Secondary | ICD-10-CM | POA: Diagnosis present

## 2015-05-22 DIAGNOSIS — F329 Major depressive disorder, single episode, unspecified: Secondary | ICD-10-CM | POA: Diagnosis not present

## 2015-05-22 DIAGNOSIS — G8929 Other chronic pain: Secondary | ICD-10-CM | POA: Diagnosis present

## 2015-05-22 DIAGNOSIS — F32A Depression, unspecified: Secondary | ICD-10-CM

## 2015-05-22 DIAGNOSIS — F17200 Nicotine dependence, unspecified, uncomplicated: Secondary | ICD-10-CM

## 2015-05-22 DIAGNOSIS — Z720 Tobacco use: Secondary | ICD-10-CM

## 2015-05-22 DIAGNOSIS — F419 Anxiety disorder, unspecified: Secondary | ICD-10-CM | POA: Insufficient documentation

## 2015-05-22 DIAGNOSIS — Z7982 Long term (current) use of aspirin: Secondary | ICD-10-CM | POA: Insufficient documentation

## 2015-05-22 MED ORDER — ATORVASTATIN CALCIUM 20 MG PO TABS: 20.0000 mg | ORAL_TABLET | Freq: Every day | ORAL | Status: DC

## 2015-05-22 MED ORDER — LISINOPRIL-HYDROCHLOROTHIAZIDE 20-12.5 MG PO TABS: 1.0000 | ORAL_TABLET | Freq: Every day | ORAL | Status: DC

## 2015-05-22 MED ORDER — BUPROPION HCL ER (SR) 150 MG PO TB12: 150.0000 mg | ORAL_TABLET | Freq: Every day | ORAL | Status: DC

## 2015-05-22 MED ORDER — ESCITALOPRAM OXALATE 20 MG PO TABS: 20.0000 mg | ORAL_TABLET | Freq: Every day | ORAL | Status: DC

## 2015-05-22 MED ORDER — MUPIROCIN 2 % EX OINT: 1.0000 "application " | TOPICAL_OINTMENT | Freq: Three times a day (TID) | CUTANEOUS | Status: DC

## 2015-05-22 MED ORDER — BUPROPION HCL ER (SR) 150 MG PO TB12: 150.0000 mg | ORAL_TABLET | Freq: Two times a day (BID) | ORAL | Status: DC

## 2015-05-22 MED FILL — MUPIROCIN 2% OINTMENT: 2 | 14 days supply | Qty: 22 | Fill #0

## 2015-05-22 MED FILL — BUPROPION SR 150 MG TABLET: 150 | 30 days supply | Qty: 60 | Fill #0

## 2015-05-22 MED FILL — ESCITALOPRAM 20 MG TABLET: 20 | 30 days supply | Qty: 30 | Fill #0

## 2015-05-22 NOTE — Progress Notes (Signed)
Disability exam and form C/C irritation and sore on nose x2 day  Pain scale #5 Tobacco user 5 cigarette per day  No suicidal thoughts in the past two weeks

## 2015-05-22 NOTE — Assessment & Plan Note (Addendum)
A: declined with worsening screening, worsening depression and anxiety, since starting wellbutrin P: Counseling services available at [facility], [place] and [place].  Taper off wellbutrin take 150 mg daily for one week, then every other day for one week, then STOP Add lexapro 20 mg daily

## 2015-05-22 NOTE — Progress Notes (Signed)
ASSESSMENT: Pt currently experiencing symptoms of anxiety and depression. Pt needs to f/u with PCP and [facility]; would benefit from psychoeducation and brief therapeutic interventions.  Stage of Change: contemplative  PLAN: 1. F/U with behavioral health consultant in one month 2. Psychiatric Medications: Wellbutrin, Lexapro(will be tapering off Wellbutrin, adding Lexapro, as prescribed by PCP) 3. Behavioral recommendation(s):   -Continue to find humor in everyday life -Continue to keep positive outlook -Consider increased social support -Consider reading educational material regarding coping with symptoms of anxiety and depression SUBJECTIVE: Pt. referred by Dr Adrian Blackwater for symptoms of anxiety and deression:  Pt. reports the following symptoms/concerns: Pt states that she has "been through a lot in life, and going through a lot right now", does not want to open it all up today. She copes by finding the humor in life, tries to keep positive; proud to be breast cancer survivor, married over 35 years, children all grown. She is enjoying going back to school at [employer], but feels somewhat isolated doing online classes, wants to come back to talk some more at next visit.  Duration of problem: Two months (increase in symptoms) Severity: severe  OBJECTIVE: Orientation & Cognition: Oriented x3. Thought processes normal and appropriate to situation. Mood: appropriate. Affect: appropriate Appearance: appropriate Risk of harm to self or others: no known risk of harm to self or others Substance use: tobacco Assessments administered: PHQ9: 22/ GAD7: 21  Diagnosis: Anxiety and depression CPT Code: F41.8 -------------------------------------------- Other(s) present in the room: none  Time spent with patient in exam room: 20 minutes, 12:45-1:05pm   Depression screen [facility] 2/9 05/22/2015 03/15/2015 04/12/2014  Decreased Interest 3 1 2   Down, Depressed, Hopeless 2 1 2   PHQ - 2 Score 5 2 4   Altered sleeping 3 3 2    Tired, decreased energy 3 3 2   Change in appetite 3 2 2   Feeling bad or failure about yourself  3 0 0  Trouble concentrating 3 2 0  Moving slowly or fidgety/restless 2 - 0  Suicidal thoughts 0 0 0  PHQ-9 Score 22 12 10     GAD 7 : Generalized Anxiety Score 05/22/2015 03/15/2015  Nervous, Anxious, on Edge 3 0  Control/stop worrying 3 1  Worry too much - different things 3 1  Trouble relaxing 3 3  Restless 3 1  Easily annoyed or irritable 3 1  Afraid - awful might happen 3 0  Total GAD 7 Score 21 7  Anxiety Difficulty Not difficult at all -

## 2015-05-22 NOTE — Progress Notes (Signed)
Subjective:  Patient ID: Brandy Stevenson, female    DOB: 12/05/1955  Age: 60 y.o. MRN: [id number]  CC: Follow-up   HPI Brandy Stevenson presents for   1. Disability evaluation: she comes today with a form for disability. She is applying for disability on the basis of her chronic leg pain due to peripheral arterial disease. She continues to smoke.   2. Sore on nose: x 2 days. Swelling in L nares. No history of nasal sores in the past. No trauma to her nose. No fever or chills.   Past Medical History  Diagnosis Date  . Hypertension   . COPD (chronic obstructive pulmonary disease) ([place])   . Hyperlipemia   . Cancer ([place]) 2009    L, Breast  . Peripheral vascular disease ([place]) 2013   Social History  Substance Use Topics  . Smoking status: Light Tobacco Smoker -- 0.25 packs/day for 40 years    Types: Cigarettes  . Smokeless tobacco: Never Used  . Alcohol Use: No    Outpatient Prescriptions Prior to Visit  Medication Sig Dispense Refill  . albuterol (PROVENTIL HFA;VENTOLIN HFA) 108 (90 Base) MCG/ACT inhaler Inhale 2 puffs into the lungs every 6 (six) hours as needed for wheezing. 3 Inhaler 12  . aspirin 81 MG tablet Take 81 mg by mouth daily.    [name] atorvastatin (LIPITOR) 20 MG tablet Take 1 tablet (20 mg total) by mouth daily. 90 tablet 3  . buPROPion (WELLBUTRIN SR) 150 MG 12 hr tablet Take 1 tablet (150 mg total) by mouth 2 (two) times daily. 150 mg daily for 3 days, then twice daily, take 2nd dose before 5 PM 60 tablet 1  . diphenhydrAMINE (BENADRYL) 25 MG tablet Take 1-2 tablets (25-50 mg total) by mouth at bedtime as needed for sleep. 30 tablet 0  . Fluticasone-Salmeterol (ADVAIR) 100-50 MCG/DOSE AEPB Inhale 1 puff into the lungs 2 (two) times daily. 180 each 3  . gabapentin (NEURONTIN) 300 MG capsule Take 2 capsules (600 mg total) by mouth at bedtime. 60 capsule 5  . ibuprofen (ADVIL,MOTRIN) 200 MG tablet Take 200-800 mg by mouth every 8 (eight) hours as needed for pain.     [name]  lisinopril-hydrochlorothiazide (PRINZIDE,ZESTORETIC) 20-12.5 MG tablet Take 1 tablet by mouth daily. 30 tablet 2  . ranitidine (ZANTAC) 150 MG tablet TAKE 1 TABLET BY MOUTH 2 TIMES DAILY. 60 tablet 2  . diphenhydrAMINE (BENADRYL) 25 mg capsule Reported on 05/22/2015  0   No facility-administered medications prior to visit.    ROS Review of Systems  Constitutional: Negative for fever and chills.  Eyes: Negative for visual disturbance.  Respiratory: Negative for shortness of breath.   Cardiovascular: Negative for chest pain.  Gastrointestinal: Negative for abdominal pain and blood in stool.  Musculoskeletal: Positive for myalgias. Negative for back pain and arthralgias.  Skin: Negative for rash.  Allergic/Immunologic: Negative for immunocompromised state.  Hematological: Negative for adenopathy. Does not bruise/bleed easily.  Psychiatric/Behavioral: Positive for sleep disturbance and dysphoric mood. Negative for suicidal ideas.    Objective:  BP 107/68 mmHg  Pulse 73  Temp(Src) 98.3 F (36.8 C) (Oral)  Resp 16  Ht 5\' 5"  (1.651 m)  Wt 151 lb (68.493 kg)  BMI 25.13 kg/m2  SpO2 96%  BP/Weight 05/22/2015 [id number]  Systolic BP [id number]  Diastolic BP 68 80 89  Wt. (Lbs) 151 151 148  BMI 25.13 25.13 24.63   Physical Exam  Constitutional: She is oriented to person, place, and time. She  appears well-developed and well-nourished. No distress.  HENT:  Head: Normocephalic and atraumatic.  Nose:    Cardiovascular: Normal rate, regular rhythm, normal heart sounds and intact distal pulses.  Exam reveals decreased pulses.   Pulmonary/Chest: Effort normal and breath sounds normal.  Musculoskeletal: She exhibits no edema.  Neurological: She is alert and oriented to person, place, and time.  Skin: Skin is warm and dry. No rash noted.  Psychiatric: She exhibits a depressed mood.   Lab Results  Component Value Date   HGBA1C 5.90 03/15/2015   Depression screen [facility] 2/9  05/22/2015 03/15/2015 04/12/2014  Decreased Interest 3 1 2   Down, Depressed, Hopeless 2 1 2   PHQ - 2 Score 5 2 4   Altered sleeping 3 3 2   Tired, decreased energy 3 3 2   Change in appetite 3 2 2   Feeling bad or failure about yourself  3 0 0  Trouble concentrating 3 2 0  Moving slowly or fidgety/restless 2 - 0  Suicidal thoughts 0 0 0  PHQ-9 Score 22 12 10     GAD 7 : Generalized Anxiety Score 05/22/2015 03/15/2015  Nervous, Anxious, on Edge 3 0  Control/stop worrying 3 1  Worry too much - different things 3 1  Trouble relaxing 3 3  Restless 3 1  Easily annoyed or irritable 3 1  Afraid - awful might happen 3 0  Total GAD 7 Score 21 7  Anxiety Difficulty Not difficult at all -       Assessment & Plan:   There are no diagnoses linked to this encounter. Tagen was seen today for follow-up.  Diagnoses and all orders for this visit:  Essential hypertension, benign -     lisinopril-hydrochlorothiazide (PRINZIDE,ZESTORETIC) 20-12.5 MG tablet; Take 1 tablet by mouth daily.  Hyperlipidemia -     atorvastatin (LIPITOR) 20 MG tablet; Take 1 tablet (20 mg total) by mouth daily.  Nasal sore -     mupirocin ointment (BACTROBAN) 2 %; Place 1 application into the nose 3 (three) times daily. Apply to L side of nose  Smoking -     Discontinue: buPROPion (WELLBUTRIN SR) 150 MG 12 hr tablet; Take 1 tablet (150 mg total) by mouth 2 (two) times daily. -     buPROPion (WELLBUTRIN SR) 150 MG 12 hr tablet; Take 1 tablet (150 mg total) by mouth daily.  Anxiety and depression -     escitalopram (LEXAPRO) 20 MG tablet; Take 1 tablet (20 mg total) by mouth daily. -     buPROPion (WELLBUTRIN SR) 150 MG 12 hr tablet; Take 1 tablet (150 mg total) by mouth daily.   Meds ordered this encounter  Medications  . lisinopril-hydrochlorothiazide (PRINZIDE,ZESTORETIC) 20-12.5 MG tablet    Sig: Take 1 tablet by mouth daily.    Dispense:  30 tablet    Refill:  11  . atorvastatin (LIPITOR) 20 MG tablet     Sig: Take 1 tablet (20 mg total) by mouth daily.    Dispense:  30 tablet    Refill:  11  . mupirocin ointment (BACTROBAN) 2 %    Sig: Place 1 application into the nose 3 (three) times daily. Apply to L side of nose    Dispense:  22 g    Refill:  0    Follow-up: No Follow-up on file.   Boykin Nearing MD

## 2015-05-22 NOTE — Patient Instructions (Addendum)
Brandy Stevenson was seen today for follow-up.  Diagnoses and all orders for this visit:  Essential hypertension, benign -     lisinopril-hydrochlorothiazide (PRINZIDE,ZESTORETIC) 20-12.5 MG tablet; Take 1 tablet by mouth daily.  Hyperlipidemia -     atorvastatin (LIPITOR) 20 MG tablet; Take 1 tablet (20 mg total) by mouth daily.  Nasal sore -     mupirocin ointment (BACTROBAN) 2 %; Place 1 application into the nose 3 (three) times daily. Apply to L side of nose  Smoking -     Discontinue: buPROPion (WELLBUTRIN SR) 150 MG 12 hr tablet; Take 1 tablet (150 mg total) by mouth 2 (two) times daily. -     buPROPion (WELLBUTRIN SR) 150 MG 12 hr tablet; Take 1 tablet (150 mg total) by mouth daily.  Anxiety and depression -     escitalopram (LEXAPRO) 20 MG tablet; Take 1 tablet (20 mg total) by mouth daily. -     buPROPion (WELLBUTRIN SR) 150 MG 12 hr tablet; Take 1 tablet (150 mg total) by mouth daily.  Continue to work on quitting smoking Counseling services available at [facility] [place], [place] and [employer].  Taper off wellbutrin take 150 mg daily for one week, then every other day for one week, then STOP Add lexapro 20 mg daily   F/u in 6 weeks for smoking cessation and depression   Dr. Adrian Blackwater

## 2015-05-22 NOTE — Assessment & Plan Note (Signed)
Down to 5 cigs per day Addressed smoking cessation

## 2015-05-24 ENCOUNTER — Other Ambulatory Visit: Payer: Self-pay | Admitting: Gastroenterology

## 2015-06-14 ENCOUNTER — Encounter (HOSPITAL_COMMUNITY): Payer: Self-pay | Admitting: *Deleted

## 2015-06-18 ENCOUNTER — Telehealth: Payer: Self-pay | Admitting: Clinical

## 2015-06-18 NOTE — Telephone Encounter (Signed)
Attempt to f/u with pt; no voicemail set up, no message left 

## 2015-06-22 MED FILL — LISINOPRIL-HCTZ 20-12.5 MG: 20-12.5 | 30 days supply | Qty: 30 | Fill #0

## 2015-06-22 MED FILL — raNITIdine HCL 150 MG TABS: 150 | 30 days supply | Qty: 60 | Fill #1

## 2015-06-22 MED FILL — ATORVASTATIN 20 MG TABLET: 20 | 30 days supply | Qty: 30 | Fill #3

## 2015-06-25 MED FILL — TRILYTE WITH FLAVOR PACKETS: 420 | 1 days supply | Qty: 4000 | Fill #0

## 2015-06-26 ENCOUNTER — Ambulatory Visit (HOSPITAL_COMMUNITY): Payer: BLUE CROSS/BLUE SHIELD | Admitting: Anesthesiology

## 2015-06-26 ENCOUNTER — Ambulatory Visit (HOSPITAL_COMMUNITY)
Admission: RE | Admit: 2015-06-26 | Discharge: 2015-06-26 | Disposition: A | Discharge status: Home / Self Care | Payer: BLUE CROSS/BLUE SHIELD | Source: Ambulatory Visit | Attending: Gastroenterology | Admitting: Gastroenterology

## 2015-06-26 ENCOUNTER — Encounter (HOSPITAL_COMMUNITY): Payer: Self-pay | Admitting: Anesthesiology

## 2015-06-26 ENCOUNTER — Encounter (HOSPITAL_COMMUNITY)
Admission: RE | Disposition: A | Discharge status: Home / Self Care | Payer: Self-pay | Source: Ambulatory Visit | Attending: Gastroenterology

## 2015-06-26 DIAGNOSIS — K621 Rectal polyp: Secondary | ICD-10-CM | POA: Insufficient documentation

## 2015-06-26 DIAGNOSIS — E7800 Pure hypercholesterolemia, unspecified: Secondary | ICD-10-CM | POA: Diagnosis not present

## 2015-06-26 DIAGNOSIS — Z9049 Acquired absence of other specified parts of digestive tract: Secondary | ICD-10-CM | POA: Diagnosis not present

## 2015-06-26 DIAGNOSIS — D124 Benign neoplasm of descending colon: Secondary | ICD-10-CM | POA: Diagnosis not present

## 2015-06-26 DIAGNOSIS — D125 Benign neoplasm of sigmoid colon: Secondary | ICD-10-CM | POA: Diagnosis not present

## 2015-06-26 DIAGNOSIS — J449 Chronic obstructive pulmonary disease, unspecified: Secondary | ICD-10-CM | POA: Insufficient documentation

## 2015-06-26 DIAGNOSIS — F17210 Nicotine dependence, cigarettes, uncomplicated: Secondary | ICD-10-CM | POA: Diagnosis not present

## 2015-06-26 DIAGNOSIS — Z800 Family history of malignant neoplasm of digestive organs: Secondary | ICD-10-CM | POA: Diagnosis not present

## 2015-06-26 DIAGNOSIS — Z853 Personal history of malignant neoplasm of breast: Secondary | ICD-10-CM | POA: Insufficient documentation

## 2015-06-26 DIAGNOSIS — I10 Essential (primary) hypertension: Secondary | ICD-10-CM | POA: Diagnosis not present

## 2015-06-26 DIAGNOSIS — D123 Benign neoplasm of transverse colon: Secondary | ICD-10-CM | POA: Diagnosis not present

## 2015-06-26 DIAGNOSIS — I739 Peripheral vascular disease, unspecified: Secondary | ICD-10-CM | POA: Insufficient documentation

## 2015-06-26 DIAGNOSIS — Z1211 Encounter for screening for malignant neoplasm of colon: Secondary | ICD-10-CM | POA: Insufficient documentation

## 2015-06-26 HISTORY — DX: Depression, unspecified: F32.A

## 2015-06-26 HISTORY — DX: Unspecified osteoarthritis, unspecified site: M19.90

## 2015-06-26 HISTORY — PX: COLONOSCOPY WITH PROPOFOL: SHX5780

## 2015-06-26 HISTORY — DX: Major depressive disorder, single episode, unspecified: F32.9

## 2015-06-26 HISTORY — DX: Gastro-esophageal reflux disease without esophagitis: K21.9

## 2015-06-26 SURGERY — COLONOSCOPY WITH PROPOFOL
Anesthesia: Monitor Anesthesia Care

## 2015-06-26 MED ORDER — PROPOFOL 500 MG/50ML IV EMUL
INTRAVENOUS | Status: DC | PRN
  Administered 2015-06-26: 110 ug/kg/min via INTRAVENOUS

## 2015-06-26 MED ORDER — LIDOCAINE HCL (CARDIAC) 20 MG/ML IV SOLN
INTRAVENOUS | Status: DC | PRN
  Administered 2015-06-26: 25 mg via INTRATRACHEAL

## 2015-06-26 MED ORDER — LACTATED RINGERS IV SOLN
INTRAVENOUS | Status: DC
  Administered 2015-06-26: 1000 mL via INTRAVENOUS

## 2015-06-26 MED ORDER — PROPOFOL 10 MG/ML IV BOLUS
INTRAVENOUS | Status: AC
  Filled 2015-06-26: qty 60

## 2015-06-26 MED ORDER — PROPOFOL 10 MG/ML IV BOLUS
INTRAVENOUS | Status: DC | PRN
  Administered 2015-06-26 (×4): 20 mg via INTRAVENOUS
  Administered 2015-06-26: 40 mg via INTRAVENOUS
  Administered 2015-06-26 (×2): 20 mg via INTRAVENOUS

## 2015-06-26 MED ORDER — SODIUM CHLORIDE 0.9 % IV SOLN: INTRAVENOUS | Status: DC

## 2015-06-26 MED ORDER — PROPOFOL 10 MG/ML IV BOLUS
INTRAVENOUS | Status: AC
  Filled 2015-06-26: qty 20

## 2015-06-26 SURGICAL SUPPLY — 22 items

## 2015-06-26 NOTE — Transfer of Care (Signed)
Immediate Anesthesia Transfer of Care Note  Patient: Brandy Stevenson  Procedure(s) Performed: Procedure(s): COLONOSCOPY WITH PROPOFOL (N/A)  Patient Location: PACU and Endoscopy Unit  Anesthesia Type:MAC  Level of Consciousness: sedated and patient cooperative  Airway & Oxygen Therapy: Patient Spontanous Breathing and Patient connected to face mask oxygen  Post-op Assessment: Report given to RN and Post -op Vital signs reviewed and stable  Post vital signs: Reviewed and stable  Last Vitals:  Filed Vitals:   06/26/15 1039  BP: 155/97  Pulse: 59  Temp: 36.5 C  Resp: 17    Complications: No apparent anesthesia complications

## 2015-06-26 NOTE — Op Note (Signed)
[facility] Patient Name: Brandy Stevenson Procedure Date: 06/26/2015 MRN: [id number] Attending MD: [name] , MD Date of Birth: 02-14-1956 CSN:  Age: 60 Admit Type: Outpatient Procedure:                Colonoscopy Indications:              Screening for colorectal malignant neoplasm Providers:                [name], MD, Sarah Monday, RN, William Dalton, Technician Referring MD:              Medicines:                Propofol per Anesthesia Complications:            No immediate complications. Estimated Blood Loss:     Estimated blood loss: none. Procedure:                Pre-Anesthesia Assessment:                           - Prior to the procedure, a History and Physical                            was performed, and patient medications and                            allergies were reviewed. The patient's tolerance of                            previous anesthesia was also reviewed. The risks                            and benefits of the procedure and the sedation                            options and risks were discussed with the patient.                            All questions were answered, and informed consent                            was obtained. Prior Anticoagulants: The patient has                            taken aspirin, last dose was day of procedure. ASA                            Grade Assessment: III - A patient with severe                            systemic disease. After reviewing the risks and  benefits, the patient was deemed in satisfactory                            condition to undergo the procedure.                           After obtaining informed consent, the colonoscope                            was passed under direct vision. Throughout the                            procedure, the patient's blood pressure, pulse, and                            oxygen saturations  were monitored continuously. The                            [id number]) scope was introduced through                            the anus and advanced to the the cecum, identified                            by appendiceal orifice and ileocecal valve. The                            colonoscopy was technically difficult and complex                            due to significant looping. The patient tolerated                            the procedure well. The quality of the bowel                            preparation was good. The appendiceal orifice and                            the rectum were photographed. Scope In: 11:33:24 AM Scope Out: 12:15:57 PM Scope Withdrawal Time: 0 hours 36 minutes 52 seconds  Total Procedure Duration: 0 hours 42 minutes 33 seconds  Findings:      The perianal and digital rectal examinations were normal.      A 10 mm polyp was found in the proximal transverse colon. The polyp was       sessile. The polyp was removed with a hot snare. Resection and retrieval       were complete.      A 7 mm polyp was found in the proximal descending colon. The polyp was       sessile. The polyp was removed with a hot snare. Resection and retrieval       were complete.      A 7 mm polyp was found in the sigmoid colon. The polyp was sessile. The  polyp was removed with a hot snare. Resection and retrieval were       complete.      Three sessile polyps were found in the rectum. The polyps were 3 to 5 mm       in size. These polyps were removed with a cold biopsy forceps. Resection       and retrieval were complete.      The exam was otherwise without abnormality. Impression:               - One 10 mm polyp in the proximal transverse colon,                            removed with a hot snare. Resected and retrieved.                           - One 7 mm polyp in the proximal descending colon,                            removed with a hot snare. Resected and  retrieved.                           - One 7 mm polyp in the sigmoid colon, removed with                            a hot snare. Resected and retrieved.                           - Three 3 to 5 mm polyps in the rectum, removed                            with a cold biopsy forceps. Resected and retrieved.                           - The examination was otherwise normal. Moderate Sedation:      N/A- Per Anesthesia Care Recommendation:           - Patient has a contact number available for                            emergencies. The signs and symptoms of potential                            delayed complications were discussed with the                            patient. Return to normal activities tomorrow.                            Written discharge instructions were provided to the                            patient.                           -  Repeat colonoscopy date to be determined after                            pending pathology results are reviewed for                            surveillance.                           - Resume previous diet.                           - Continue present medications. Procedure Code(s):        --- Professional ---                           [phone], Colonoscopy, flexible; with removal of                            tumor(s), polyp(s), or other lesion(s) by snare                            technique                           45380, 74, Colonoscopy, flexible; with biopsy,                            single or multiple Diagnosis Code(s):        --- Professional ---                           Z12.11, Encounter for screening for malignant                            neoplasm of colon                           D12.3, Benign neoplasm of transverse colon (hepatic                            flexure or splenic flexure)                           D12.4, Benign neoplasm of descending colon                           D12.5, Benign neoplasm of sigmoid colon                            K62.1, Rectal polyp CPT copyright 2016 [employer]. All rights reserved. The codes documented in this report are preliminary and upon coder review may  be revised to meet current compliance requirements. Earle Gell, MD [name], MD 06/26/2015 12:24:51 PM This report has been signed electronically. Number of Addenda: 0

## 2015-06-26 NOTE — Anesthesia Preprocedure Evaluation (Addendum)
Anesthesia Evaluation  Patient identified by MRN, date of birth, ID band Patient awake    Reviewed: Allergy & Precautions, NPO status , Patient's Chart, lab work & pertinent test results  Airway Mallampati: II  TM Distance: >3 FB Neck ROM: Full    Dental no notable dental hx.    Pulmonary COPD,  COPD inhaler, Current Smoker,    Pulmonary exam normal breath sounds clear to auscultation       Cardiovascular hypertension, Pt. on medications + Peripheral Vascular Disease  Normal cardiovascular exam Rhythm:Regular Rate:Normal     Neuro/Psych PSYCHIATRIC DISORDERS Depression  Neuromuscular disease    GI/Hepatic Neg liver ROS, GERD  ,  Endo/Other  negative endocrine ROS  Renal/GU negative Renal ROS  negative genitourinary   Musculoskeletal  (+) Arthritis ,   Abdominal   Peds negative pediatric ROS (+)  Hematology negative hematology ROS (+)   Anesthesia Other Findings   Reproductive/Obstetrics negative OB ROS                             Anesthesia Physical Anesthesia Plan  ASA: II  Anesthesia Plan: MAC   Post-op Pain Management:    Induction: Intravenous  Airway Management Planned: Natural Airway  Additional Equipment:   Intra-op Plan:   Post-operative Plan:   Informed Consent: I have reviewed the patients History and Physical, chart, labs and discussed the procedure including the risks, benefits and alternatives for the proposed anesthesia with the patient or authorized representative who has indicated his/her understanding and acceptance.   Dental advisory given  Plan Discussed with: CRNA  Anesthesia Plan Comments:         Anesthesia Quick Evaluation

## 2015-06-26 NOTE — Discharge Instructions (Signed)

## 2015-06-26 NOTE — H&P (Signed)
  Procedure: Baseline screening colonoscopy  History: The patient is a 60 year old female born 10/29/1955. She is scheduled to undergo her first screening colonoscopy with polypectomy to prevent colon cancer.  Past medical history: Tubal ligation. Appendectomy. Breast lumpectomy. Hypertension. Chronic obstructive pulmonary disease. Hypercholesterolemia. Left breast cancer. Peripheral vascular disease. Vitamin B 12 deficiency.  Family history: Uncle diagnosed with colon cancer  Medication allergies: Vicodin causes nausea and vomiting  Exam: The patient is alert and lying comfortably on the endoscopy stretcher. Abdomen is soft and nontender to palpation. Lungs are clear to auscultation. Cardiac exam reveals a regular rhythm.  Plan: Proceed with screening colonoscopy

## 2015-06-26 NOTE — Anesthesia Postprocedure Evaluation (Signed)
Anesthesia Post Note  Patient: Brandy Stevenson  Procedure(s) Performed: Procedure(s) (LRB): COLONOSCOPY WITH PROPOFOL (N/A)  Patient location during evaluation: PACU Anesthesia Type: MAC Level of consciousness: awake and alert Pain management: pain level controlled Vital Signs Assessment: post-procedure vital signs reviewed and stable Respiratory status: spontaneous breathing, nonlabored ventilation, respiratory function stable and patient connected to nasal cannula oxygen Cardiovascular status: stable and blood pressure returned to baseline Anesthetic complications: no    Last Vitals:  Filed Vitals:   06/26/15 1240 06/26/15 1250  BP: 145/88 152/78  Pulse: 59 58  Temp:    Resp: 17 14    Last Pain:  Filed Vitals:   06/26/15 1251  PainSc: 3                  Koy Lamp J

## 2015-06-27 ENCOUNTER — Encounter (HOSPITAL_COMMUNITY): Payer: Self-pay | Admitting: Gastroenterology

## 2015-07-02 HISTORY — PX: COLONOSCOPY: SHX174

## 2015-07-09 ENCOUNTER — Other Ambulatory Visit: Payer: Self-pay | Admitting: Neurosurgery

## 2015-07-09 DIAGNOSIS — M4316 Spondylolisthesis, lumbar region: Secondary | ICD-10-CM

## 2015-07-09 DIAGNOSIS — G959 Disease of spinal cord, unspecified: Secondary | ICD-10-CM

## 2015-07-16 ENCOUNTER — Ambulatory Visit
Admission: RE | Admit: 2015-07-16 | Discharge: 2015-07-16 | Disposition: A | Discharge status: Home / Self Care | Payer: BLUE CROSS/BLUE SHIELD | Source: Ambulatory Visit | Attending: Neurosurgery | Admitting: Neurosurgery

## 2015-07-16 DIAGNOSIS — G959 Disease of spinal cord, unspecified: Secondary | ICD-10-CM

## 2015-07-16 DIAGNOSIS — M4316 Spondylolisthesis, lumbar region: Secondary | ICD-10-CM

## 2015-07-26 MED FILL — ATORVASTATIN 20 MG TABLET: 20 | 30 days supply | Qty: 30 | Fill #4

## 2015-07-26 MED FILL — LISINOPRIL-HCTZ 20-12.5 MG: 20-12.5 | 30 days supply | Qty: 30 | Fill #1

## 2015-08-27 ENCOUNTER — Other Ambulatory Visit: Payer: Self-pay | Admitting: Family Medicine

## 2015-08-27 MED FILL — GABAPENTIN 300 MG CAPSULE: 300 | 30 days supply | Qty: 60 | Fill #1

## 2015-08-27 MED FILL — LISINOPRIL-HCTZ 20-12.5 MG: 20-12.5 | 30 days supply | Qty: 30 | Fill #2

## 2015-08-27 MED FILL — ATORVASTATIN 20 MG TABLET: 20 | 30 days supply | Qty: 30 | Fill #5

## 2015-08-28 MED FILL — VENTOLIN HFA 90 MCG INHALER: 108 (90 BAS | 30 days supply | Qty: 18 | Fill #1

## 2015-08-28 MED FILL — raNITIdine HCL 150 MG TABS: 150 | 30 days supply | Qty: 60 | Fill #2

## 2015-08-30 ENCOUNTER — Encounter (HOSPITAL_COMMUNITY): Payer: Self-pay

## 2015-08-30 ENCOUNTER — Emergency Department (HOSPITAL_COMMUNITY)
Admission: EM | Admit: 2015-08-30 | Discharge: 2015-08-31 | Disposition: A | Discharge status: Home / Self Care | Payer: BLUE CROSS/BLUE SHIELD | Attending: Emergency Medicine | Admitting: Emergency Medicine

## 2015-08-30 ENCOUNTER — Emergency Department (HOSPITAL_COMMUNITY): Payer: BLUE CROSS/BLUE SHIELD

## 2015-08-30 DIAGNOSIS — E785 Hyperlipidemia, unspecified: Secondary | ICD-10-CM | POA: Insufficient documentation

## 2015-08-30 DIAGNOSIS — M1990 Unspecified osteoarthritis, unspecified site: Secondary | ICD-10-CM | POA: Insufficient documentation

## 2015-08-30 DIAGNOSIS — Z7951 Long term (current) use of inhaled steroids: Secondary | ICD-10-CM | POA: Insufficient documentation

## 2015-08-30 DIAGNOSIS — Z853 Personal history of malignant neoplasm of breast: Secondary | ICD-10-CM | POA: Insufficient documentation

## 2015-08-30 DIAGNOSIS — J449 Chronic obstructive pulmonary disease, unspecified: Secondary | ICD-10-CM | POA: Insufficient documentation

## 2015-08-30 DIAGNOSIS — Z7982 Long term (current) use of aspirin: Secondary | ICD-10-CM | POA: Insufficient documentation

## 2015-08-30 DIAGNOSIS — Z79899 Other long term (current) drug therapy: Secondary | ICD-10-CM | POA: Insufficient documentation

## 2015-08-30 DIAGNOSIS — F17210 Nicotine dependence, cigarettes, uncomplicated: Secondary | ICD-10-CM | POA: Insufficient documentation

## 2015-08-30 DIAGNOSIS — F329 Major depressive disorder, single episode, unspecified: Secondary | ICD-10-CM | POA: Insufficient documentation

## 2015-08-30 DIAGNOSIS — I10 Essential (primary) hypertension: Secondary | ICD-10-CM | POA: Insufficient documentation

## 2015-08-30 DIAGNOSIS — I739 Peripheral vascular disease, unspecified: Secondary | ICD-10-CM | POA: Insufficient documentation

## 2015-08-30 DIAGNOSIS — K219 Gastro-esophageal reflux disease without esophagitis: Secondary | ICD-10-CM | POA: Insufficient documentation

## 2015-08-30 DIAGNOSIS — R0602 Shortness of breath: Secondary | ICD-10-CM | POA: Insufficient documentation

## 2015-08-30 NOTE — ED Notes (Signed)
Patient states multiple complaints.  Patient states that has had pain in her right chest since about 1800 today.  Patient denies nauseau and vomiting but,  states that has had some SOB.  Patient  states that she has been out of her breathing treatments and has been using her granddaughters medication which is different from what she takes.   Patient states has been unable to fill her albuterol medication due to not being able to afford it.  Patient also states has a Hx of acid reflux and has been out of her medication x2 weeks.  Patient also c/o abdominal pain and frequent urination.  Patient denies excessive thirst.

## 2015-08-30 NOTE — ED Notes (Signed)
Patient transported to x-ray. ?

## 2015-08-30 NOTE — ED Notes (Signed)
This RN reassessed VS....blood pressure 150/95, PR 65, O2 97, RR 16.

## 2015-08-30 NOTE — ED Notes (Signed)
Bed: [facility] Expected date:  Expected time:  Means of arrival:  Comments: EKGs

## 2015-08-31 ENCOUNTER — Encounter (HOSPITAL_COMMUNITY): Payer: Self-pay | Admitting: Emergency Medicine

## 2015-08-31 LAB — CBC
HCT: 36.7 % (ref 36.0–46.0)
Hemoglobin: 12.8 g/dL (ref 12.0–15.0)
MCH: 31.3 pg (ref 26.0–34.0)
MCHC: 34.9 g/dL (ref 30.0–36.0)
MCV: 89.7 fL (ref 78.0–100.0)
Platelets: 247 10*3/uL (ref 150–400)
RBC: 4.09 MIL/uL (ref 3.87–5.11)
RDW: 13.5 % (ref 11.5–15.5)
WBC: 5.8 10*3/uL (ref 4.0–10.5)

## 2015-08-31 LAB — BASIC METABOLIC PANEL
Anion gap: 9 (ref 5–15)
BUN: 15 mg/dL (ref 6–20)
CO2: 31 mmol/L (ref 22–32)
Calcium: 9.7 mg/dL (ref 8.9–10.3)
Chloride: 101 mmol/L (ref 101–111)
Creatinine, Ser: 0.73 mg/dL (ref 0.44–1.00)
GFR calc Af Amer: >60 mL/min (ref >60)
GFR calc non Af Amer: >60 mL/min (ref >60)
Glucose, Bld: 99 mg/dL (ref 65–99)
Potassium: 3.3 mmol/L — ABNORMAL LOW (ref 3.5–5.1)
Sodium: 141 mmol/L (ref 135–145)

## 2015-08-31 LAB — I-STAT TROPONIN, ED
Troponin i, poc: 0 ng/mL (ref 0.00–0.08)
Troponin i, poc: 0.01 ng/mL (ref 0.00–0.08)

## 2015-08-31 MED ORDER — GI COCKTAIL ~~LOC~~
30.0000 mL | Freq: Once | ORAL | Status: AC
  Administered 2015-08-31: 30 mL via ORAL
  Filled 2015-08-31: qty 30

## 2015-08-31 NOTE — ED Provider Notes (Signed)
CSN: [id number]     Arrival date & time 08/30/15  2251 History  By signing my name below, I, Reola Mosher, attest that this documentation has been prepared under the direction and in the presence of Etana Beets, MD.  Electronically Signed: Reola Mosher, ED Scribe. 08/31/2015. 1:38 AM.   Chief Complaint  Patient presents with  . Chest Pain  . Shortness of Breath   Patient is a 60 y.o. female presenting with chest pain. The history is provided by the patient.  Chest Pain Pain location:  R chest Pain quality: not sharp   Pain radiates to:  Does not radiate Pain radiates to the back: no   Pain severity:  Moderate Onset quality:  Gradual Timing:  Constant Progression:  Unchanged Context: not breathing   Relieved by:  Nothing Worsened by:  Nothing tried Associated symptoms: shortness of breath   Associated symptoms: no abdominal pain, no diaphoresis, no fever, no nausea and not vomiting    HPI Comments: Brandy Stevenson is a 60 y.o. female with a PMHx significant for COPD, and GERD, and HTN who presents to the Emergency Department complaining of sudden onset, resolved CP onset ~7 hours PTA. Pt reports that she was standing, and she began to felt nervous and felt urinary and bowel urgency. She reports that she could not pass a bowel movement when she went to the bathroom, and it was all flatulence. She has not taken her Zantac for her GERD in 9 days because she ran out. She has not had a cardiac catheterization or stress testing in the past. She is not complaining of any other symptoms at this time. She denies nausea or vomiting.    Past Medical History  Diagnosis Date  . Hypertension   . COPD (chronic obstructive pulmonary disease) ([place])   . Hyperlipemia   . Peripheral vascular disease ([place]) 2013  . Depression   . GERD (gastroesophageal reflux disease)   . Arthritis     back problems to be evaluated by neurology next month  . Cancer ([place]) 2009    L, Breast, radiation  and surgery.   Past Surgical History  Procedure Laterality Date  . Tubal ligation  1991  . Breast surgery    . Breast lumpectomy Left     s/p radiation therapy   . Appendectomy  1980  . Colonoscopy with propofol N/A 06/26/2015    Procedure: COLONOSCOPY WITH PROPOFOL;  Surgeon: [name], MD;  Location: [facility];  Service: Endoscopy;  Laterality: N/A;   Family History  Problem Relation Age of Onset  . Hyperlipidemia Mother   . Hypertension Mother   . Heart disease Mother     Atrial Fib.  . Hyperlipidemia Sister   . Hypertension Sister   . Heart disease Sister     Before age 107,  CHF  . Heart attack Brother   . Hyperlipidemia Brother   . Hypertension Brother   . Hypertension Daughter    Social History  Substance Use Topics  . Smoking status: Light Tobacco Smoker -- 0.25 packs/day for 40 years    Types: Cigarettes  . Smokeless tobacco: Never Used     Comment: trying to quit  . Alcohol Use: No   OB History    No data available     Review of Systems  Constitutional: Negative for fever and diaphoresis.  Respiratory: Positive for shortness of breath. Negative for stridor.   Cardiovascular: Positive for chest pain. Negative for leg swelling.  Gastrointestinal:  Negative for nausea, vomiting and abdominal pain.  All other systems reviewed and are negative.  Allergies  Vicodin  Home Medications   Prior to Admission medications   Medication Sig Start Date End Date Taking? Authorizing Provider  albuterol (PROVENTIL HFA;VENTOLIN HFA) 108 (90 Base) MCG/ACT inhaler Inhale 2 puffs into the lungs every 6 (six) hours as needed for wheezing. 05/03/15  Yes Boykin Nearing, MD  aspirin EC 81 MG tablet Take 81 mg by mouth daily.   Yes Historical Provider, MD  atorvastatin (LIPITOR) 20 MG tablet Take 1 tablet (20 mg total) by mouth daily. 05/22/15  Yes Josalyn Funches, MD  buPROPion (WELLBUTRIN SR) 150 MG 12 hr tablet Take 1 tablet (150 mg total) by mouth daily. 05/22/15  Yes  Josalyn Funches, MD  diphenhydrAMINE (BENADRYL) 25 mg capsule TAKE 1-2 CAPSULES BY MOUTH AT BEDTIME AS NEEDED FOR SLEEP. 08/27/15  Yes Josalyn Funches, MD  diphenhydramine-acetaminophen (TYLENOL PM) 25-500 MG TABS tablet Take 1 tablet by mouth at bedtime as needed (pain/sleep).   Yes Historical Provider, MD  escitalopram (LEXAPRO) 20 MG tablet Take 1 tablet (20 mg total) by mouth daily. 05/22/15  Yes Josalyn Funches, MD  Fluticasone-Salmeterol (ADVAIR) 100-50 MCG/DOSE AEPB Inhale 1 puff into the lungs 2 (two) times daily. 05/03/15  Yes Josalyn Funches, MD  gabapentin (NEURONTIN) 300 MG capsule Take 2 capsules (600 mg total) by mouth at bedtime. Patient taking differently: Take 300 mg by mouth at bedtime.  03/15/15  Yes Josalyn Funches, MD  lisinopril-hydrochlorothiazide (PRINZIDE,ZESTORETIC) 20-12.5 MG tablet Take 1 tablet by mouth daily. 05/22/15  Yes Josalyn Funches, MD  naproxen (NAPROSYN) 500 MG tablet Take 500 mg by mouth 2 (two) times daily with a meal.   Yes Historical Provider, MD  ranitidine (ZANTAC) 150 MG tablet TAKE 1 TABLET BY MOUTH 2 TIMES DAILY. 04/06/15  Yes Josalyn Funches, MD   BP 128/88 mmHg  Pulse 60  Temp(Src) 98.3 F (36.8 C) (Oral)  Resp 17  Ht 5\' 5"  (1.651 m)  Wt 150 lb (68.04 kg)  BMI 24.96 kg/m2  SpO2 100%   Physical Exam  Constitutional: She is oriented to person, place, and time. She appears well-developed and well-nourished. No distress.  HENT:  Head: Normocephalic and atraumatic.  Mouth/Throat: Oropharynx is clear and moist. No oropharyngeal exudate.  Eyes: Conjunctivae and EOM are normal. Pupils are equal, round, and reactive to light.  Neck: Trachea normal and normal range of motion. Neck supple. No JVD present. Carotid bruit is not present. No tracheal deviation present.  Cardiovascular: Normal rate, regular rhythm, normal heart sounds and intact distal pulses.  Exam reveals no gallop and no friction rub.   No murmur heard. Pulses:      Dorsalis pedis pulses  are 2+ on the right side, and 2+ on the left side.       Posterior tibial pulses are 2+ on the right side, and 2+ on the left side.  Pulmonary/Chest: Effort normal and breath sounds normal. No stridor. She has no wheezes. She has no rales. She exhibits no tenderness.  Abdominal: Soft. She exhibits no mass. There is no tenderness. There is no rebound and no guarding.  She has hyperactive bowel sounds.  Musculoskeletal: Normal range of motion. She exhibits no edema.  Pt does not have leg edema, and all compartments are soft.   Lymphadenopathy:    She has no cervical adenopathy.  Neurological: She is alert and oriented to person, place, and time. She has normal reflexes.  Skin: Skin is  warm and dry. She is not diaphoretic.  Psychiatric: She has a normal mood and affect. Her behavior is normal.  Nursing note and vitals reviewed.  ED Course  Procedures (including critical care time)  DIAGNOSTIC STUDIES: Oxygen Saturation is 100%, on RA, normal by my interpretation.   COORDINATION OF CARE: 1:18 AM-Discussed next steps with pt. Pt verbalized understanding and is agreeable with the plan.   Labs Review Labs Reviewed  BASIC METABOLIC PANEL - Abnormal; Notable for the following:    Potassium 3.3 (*)    All other components within normal limits  CBC  I-STAT TROPOININ, ED    Imaging Review Dg Chest 2 View  08/31/2015  CLINICAL DATA:  Initial evaluation for acute chest pain. EXAM: CHEST  2 VIEW COMPARISON:  Prior radiograph from 01/03/2015. FINDINGS: Cardiac and mediastinal silhouettes are stable in size and contour, and remain within normal limits. Atheromatous plaque within the aortic arch. Lungs are normally inflated. No focal infiltrate, pulmonary edema, or pleural effusion. No pneumothorax. No acute osseous abnormality. IMPRESSION: 1. No active cardiopulmonary disease. 2. Aortic atherosclerosis. Electronically Signed   By: Jeannine Boga M.D.   On: 08/31/2015 00:06   I have  personally reviewed and evaluated these images and lab results as part of my medical decision-making.   EKG Interpretation   Date/Time:  Thursday August 30 2015 23:33:18 EDT Ventricular Rate:  58 PR Interval:    QRS Duration: 102 QT Interval:  426 QTC Calculation: 419 R Axis:   26 Text Interpretation:  Sinus rhythm Confirmed by [facility]  MD, Kalyb Pemble  ([id number]) on 08/31/2015 12:53:55 AM      MDM   Final diagnoses:  None   Filed Vitals:   08/30/15 2320 08/31/15 0201  BP: 150/95 128/88  Pulse: 65 60  Temp:    Resp: 16 17   Results for orders placed or performed during the hospital encounter of [id number]  Basic metabolic panel  Result Value Ref Range   Sodium 141 135 - 145 mmol/L   Potassium 3.3 (L) 3.5 - 5.1 mmol/L   Chloride 101 101 - 111 mmol/L   CO2 31 22 - 32 mmol/L   Glucose, Bld 99 65 - 99 mg/dL   BUN 15 6 - 20 mg/dL   Creatinine, Ser 0.73 0.44 - 1.00 mg/dL   Calcium 9.7 8.9 - 10.3 mg/dL   GFR calc non Af Amer >60 >60 mL/min   GFR calc Af Amer >60 >60 mL/min   Anion gap 9 5 - 15  CBC  Result Value Ref Range   WBC 5.8 4.0 - 10.5 K/uL   RBC 4.09 3.87 - 5.11 MIL/uL   Hemoglobin 12.8 12.0 - 15.0 g/dL   HCT 36.7 36.0 - 46.0 %   MCV 89.7 78.0 - 100.0 fL   MCH 31.3 26.0 - 34.0 pg   MCHC 34.9 30.0 - 36.0 g/dL   RDW 13.5 11.5 - 15.5 %   Platelets 247 150 - 400 K/uL  I-stat troponin, ED  Result Value Ref Range   Troponin i, poc 0.00 0.00 - 0.08 ng/mL   Comment 3          I-stat troponin, ED  Result Value Ref Range   Troponin i, poc 0.01 0.00 - 0.08 ng/mL   Comment 3           Dg Chest 2 View  08/31/2015  CLINICAL DATA:  Initial evaluation for acute chest pain. EXAM: CHEST  2 VIEW COMPARISON:  Prior radiograph from 01/03/2015.  FINDINGS: Cardiac and mediastinal silhouettes are stable in size and contour, and remain within normal limits. Atheromatous plaque within the aortic arch. Lungs are normally inflated. No focal infiltrate, pulmonary edema, or pleural  effusion. No pneumothorax. No acute osseous abnormality. IMPRESSION: 1. No active cardiopulmonary disease. 2. Aortic atherosclerosis. Electronically Signed   By: Jeannine Boga M.D.   On: 08/31/2015 00:06    Pain free post GI cocktail.  I suspect her symptoms are gerd.  She is instructed to follow up with her PMD and cardiology for outpatient stress test.  Strict return precautions given.  Restart your zantac  I personally performed the services described in this documentation, which was scribed in my presence. The recorded information has been reviewed and is accurate.      Veatrice Kells, MD 08/31/15 [phone]

## 2015-09-10 ENCOUNTER — Encounter: Payer: Self-pay | Admitting: Family Medicine

## 2015-09-10 ENCOUNTER — Ambulatory Visit: Payer: Self-pay | Attending: Family Medicine | Admitting: Family Medicine

## 2015-09-10 VITALS — BP 105/71 | HR 56 | Temp 98.1°F | Resp 18 | Ht 65.0 in | Wt 149.0 lb

## 2015-09-10 DIAGNOSIS — F329 Major depressive disorder, single episode, unspecified: Secondary | ICD-10-CM | POA: Insufficient documentation

## 2015-09-10 DIAGNOSIS — Z7982 Long term (current) use of aspirin: Secondary | ICD-10-CM | POA: Insufficient documentation

## 2015-09-10 DIAGNOSIS — F419 Anxiety disorder, unspecified: Secondary | ICD-10-CM

## 2015-09-10 DIAGNOSIS — I739 Peripheral vascular disease, unspecified: Secondary | ICD-10-CM

## 2015-09-10 DIAGNOSIS — Z720 Tobacco use: Secondary | ICD-10-CM

## 2015-09-10 DIAGNOSIS — F32A Depression, unspecified: Secondary | ICD-10-CM

## 2015-09-10 DIAGNOSIS — F418 Other specified anxiety disorders: Secondary | ICD-10-CM

## 2015-09-10 DIAGNOSIS — F17210 Nicotine dependence, cigarettes, uncomplicated: Secondary | ICD-10-CM | POA: Insufficient documentation

## 2015-09-10 DIAGNOSIS — Z79899 Other long term (current) drug therapy: Secondary | ICD-10-CM | POA: Insufficient documentation

## 2015-09-10 DIAGNOSIS — F17200 Nicotine dependence, unspecified, uncomplicated: Secondary | ICD-10-CM

## 2015-09-10 DIAGNOSIS — G4700 Insomnia, unspecified: Secondary | ICD-10-CM

## 2015-09-10 DIAGNOSIS — I70219 Atherosclerosis of native arteries of extremities with intermittent claudication, unspecified extremity: Secondary | ICD-10-CM

## 2015-09-10 DIAGNOSIS — IMO0001 Reserved for inherently not codable concepts without codable children: Secondary | ICD-10-CM

## 2015-09-10 DIAGNOSIS — I70209 Unspecified atherosclerosis of native arteries of extremities, unspecified extremity: Secondary | ICD-10-CM | POA: Insufficient documentation

## 2015-09-10 MED ORDER — DIPHENHYDRAMINE HCL 25 MG PO CAPS: 25.0000 mg | ORAL_CAPSULE | Freq: Every evening | ORAL | Status: DC | PRN

## 2015-09-10 MED ORDER — GABAPENTIN 300 MG PO CAPS: ORAL_CAPSULE | ORAL | Status: DC

## 2015-09-10 MED ORDER — BUPROPION HCL ER (SR) 150 MG PO TB12: ORAL_TABLET | ORAL | Status: DC

## 2015-09-10 NOTE — Patient Instructions (Addendum)
Brandy Stevenson was seen today for depression and nicotine dependence.  Diagnoses and all orders for this visit:  Atherosclerotic PVD with intermittent claudication (HCC) -     Discontinue: gabapentin (NEURONTIN) 300 MG capsule; 300 mg in the morning and 600 mg in the afternoon -     gabapentin (NEURONTIN) 300 MG capsule; 300 mg in the morning and 600 mg in the evening  Smoking -     buPROPion (WELLBUTRIN SR) 150 MG 12 hr tablet; 150 mg daily for two weeks, then 300 mg daily  Anxiety and depression -     buPROPion (WELLBUTRIN SR) 150 MG 12 hr tablet; 150 mg daily for two weeks, then 300 mg daily  Insomnia -     diphenhydrAMINE (BENADRYL) 25 mg capsule; Take 1-2 capsules (25-50 mg total) by mouth at bedtime as needed.   F/u in 6-8  weeks for smoking cessation  Dr. Adrian Blackwater

## 2015-09-10 NOTE — Progress Notes (Signed)
Subjective:  Patient ID: Brandy Stevenson, female    DOB: 02/07/1956  Age: 60 y.o. MRN: [id number]  CC: Depression and Nicotine Dependence   HPI Jaquay Slimak has PAD with claudication she presents for   1. Smoking cessation: she is down to 5-6 cigs per day. She strongly desires to quit. She is taking 150 mg of wellbutrin most days. She does not buy cigarettes. Her husband leaves her with a few when he leaves for the day.  Social History  Substance Use Topics  . Smoking status: Light Tobacco Smoker -- 0.25 packs/day for 40 years    Types: Cigarettes  . Smokeless tobacco: Never Used     Comment: trying to quit  . Alcohol Use: No    Outpatient Prescriptions Prior to Visit  Medication Sig Dispense Refill  . albuterol (PROVENTIL HFA;VENTOLIN HFA) 108 (90 Base) MCG/ACT inhaler Inhale 2 puffs into the lungs every 6 (six) hours as needed for wheezing. 3 Inhaler 12  . aspirin EC 81 MG tablet Take 81 mg by mouth daily.    [name] atorvastatin (LIPITOR) 20 MG tablet Take 1 tablet (20 mg total) by mouth daily. 30 tablet 11  . buPROPion (WELLBUTRIN SR) 150 MG 12 hr tablet Take 1 tablet (150 mg total) by mouth daily. 30 tablet 0  . diphenhydrAMINE (BENADRYL) 25 mg capsule TAKE 1-2 CAPSULES BY MOUTH AT BEDTIME AS NEEDED FOR SLEEP. 30 capsule 0  . diphenhydramine-acetaminophen (TYLENOL PM) 25-500 MG TABS tablet Take 1 tablet by mouth at bedtime as needed (pain/sleep).    [name] escitalopram (LEXAPRO) 20 MG tablet Take 1 tablet (20 mg total) by mouth daily. 30 tablet 3  . Fluticasone-Salmeterol (ADVAIR) 100-50 MCG/DOSE AEPB Inhale 1 puff into the lungs 2 (two) times daily. 180 each 3  . gabapentin (NEURONTIN) 300 MG capsule Take 2 capsules (600 mg total) by mouth at bedtime. (Patient taking differently: Take 300 mg by mouth at bedtime. ) 60 capsule 5  . lisinopril-hydrochlorothiazide (PRINZIDE,ZESTORETIC) 20-12.5 MG tablet Take 1 tablet by mouth daily. 30 tablet 11  . naproxen (NAPROSYN) 500 MG tablet Take  500 mg by mouth 2 (two) times daily with a meal.    . ranitidine (ZANTAC) 150 MG tablet TAKE 1 TABLET BY MOUTH 2 TIMES DAILY. 60 tablet 2   No facility-administered medications prior to visit.    ROS Review of Systems  Constitutional: Negative for fever and chills.  Eyes: Negative for visual disturbance.  Respiratory: Negative for shortness of breath.   Cardiovascular: Negative for chest pain.  Gastrointestinal: Negative for abdominal pain and blood in stool.  Musculoskeletal: Positive for myalgias. Negative for back pain and arthralgias.  Skin: Negative for rash.  Allergic/Immunologic: Negative for immunocompromised state.  Hematological: Negative for adenopathy. Does not bruise/bleed easily.  Psychiatric/Behavioral: Positive for sleep disturbance and dysphoric mood. Negative for suicidal ideas.    Objective:  BP 105/71 mmHg  Pulse 56  Temp(Src) 98.1 F (36.7 C) (Oral)  Resp 18  Ht 5\' 5"  (1.651 m)  Wt 149 lb (67.586 kg)  BMI 24.79 kg/m2  SpO2 99%  BP/Weight 09/10/2015 08/31/2015 123456  Systolic BP 123456 [id number] -  Diastolic BP 71 61 -  Wt. (Lbs) 149 - 150  BMI 24.79 - 24.96    Physical Exam  Constitutional: She is oriented to person, place, and time. She appears well-developed and well-nourished. No distress.  HENT:  Head: Normocephalic and atraumatic.  Cardiovascular: Normal rate, regular rhythm, normal heart sounds and intact distal pulses.  Exam reveals  decreased pulses.   Pulmonary/Chest: Effort normal and breath sounds normal.  Musculoskeletal: She exhibits no edema.  Neurological: She is alert and oriented to person, place, and time.  Skin: Skin is warm and dry. No rash noted.  Psychiatric: She exhibits a depressed mood.    Assessment & Plan:   There are no diagnoses linked to this encounter.  Eulalia was seen today for depression and nicotine dependence.  Diagnoses and all orders for this visit:  Atherosclerotic PVD with intermittent claudication (HCC) -      Discontinue: gabapentin (NEURONTIN) 300 MG capsule; 300 mg in the morning and 600 mg in the afternoon -     gabapentin (NEURONTIN) 300 MG capsule; 300 mg in the morning and 600 mg in the evening  Smoking -     buPROPion (WELLBUTRIN SR) 150 MG 12 hr tablet; 150 mg daily for two weeks, then 300 mg daily  Anxiety and depression -     buPROPion (WELLBUTRIN SR) 150 MG 12 hr tablet; 150 mg daily for two weeks, then 300 mg daily  Insomnia -     diphenhydrAMINE (BENADRYL) 25 mg capsule; Take 1-2 capsules (25-50 mg total) by mouth at bedtime as needed.   Meds ordered this encounter  Medications  . DISCONTD: gabapentin (NEURONTIN) 300 MG capsule    Sig: 300 mg in the morning and 600 mg in the afternoon    Dispense:  90 capsule    Refill:  3  . diphenhydrAMINE (BENADRYL) 25 mg capsule    Sig: Take 1-2 capsules (25-50 mg total) by mouth at bedtime as needed.    Dispense:  30 capsule    Refill:  0  . buPROPion (WELLBUTRIN SR) 150 MG 12 hr tablet    Sig: 150 mg daily for two weeks, then 300 mg daily    Dispense:  60 tablet    Refill:  0  . gabapentin (NEURONTIN) 300 MG capsule    Sig: 300 mg in the morning and 600 mg in the evening    Dispense:  90 capsule    Refill:  3    Follow-up: No Follow-up on file.   Boykin Nearing MD

## 2015-09-11 ENCOUNTER — Encounter: Payer: Self-pay | Admitting: Family Medicine

## 2015-09-11 NOTE — Assessment & Plan Note (Signed)
She continues to smoke. She desires to quit. We spent 10 minutes discussing why she particularly needs to quit, PAD and COPD, and the benefits of quitting.  Plan wellbutrin 150 mg daily for two weeks, then 300 mg daily

## 2015-09-11 NOTE — Assessment & Plan Note (Signed)
Still with significant pain  Plan: Increase gabapentin to 300 mg in AM, 600 mg in PM Smoking cessation

## 2015-09-27 MED FILL — LISINOPRIL-HCTZ 20-12.5 MG: 20-12.5 | 30 days supply | Qty: 30 | Fill #3

## 2015-09-27 MED FILL — ATORVASTATIN 20 MG TABLET: 20 | 30 days supply | Qty: 30 | Fill #6

## 2015-10-11 ENCOUNTER — Encounter: Payer: Self-pay | Admitting: Family

## 2015-10-15 ENCOUNTER — Other Ambulatory Visit: Payer: Self-pay

## 2015-10-15 ENCOUNTER — Ambulatory Visit (INDEPENDENT_AMBULATORY_CARE_PROVIDER_SITE_OTHER): Payer: Self-pay | Admitting: Family

## 2015-10-15 ENCOUNTER — Other Ambulatory Visit: Payer: Self-pay | Admitting: Family Medicine

## 2015-10-15 ENCOUNTER — Encounter: Payer: Self-pay | Admitting: Family

## 2015-10-15 ENCOUNTER — Ambulatory Visit (HOSPITAL_COMMUNITY)
Admission: RE | Admit: 2015-10-15 | Discharge: 2015-10-15 | Disposition: A | Discharge status: Home / Self Care | Payer: Self-pay | Source: Ambulatory Visit | Attending: Family | Admitting: Family

## 2015-10-15 VITALS — BP 148/100 | HR 67 | Temp 98.4°F | Resp 16 | Ht 65.0 in | Wt 145.0 lb

## 2015-10-15 DIAGNOSIS — E785 Hyperlipidemia, unspecified: Secondary | ICD-10-CM | POA: Insufficient documentation

## 2015-10-15 DIAGNOSIS — F17200 Nicotine dependence, unspecified, uncomplicated: Secondary | ICD-10-CM

## 2015-10-15 DIAGNOSIS — Z720 Tobacco use: Secondary | ICD-10-CM | POA: Insufficient documentation

## 2015-10-15 DIAGNOSIS — I10 Essential (primary) hypertension: Secondary | ICD-10-CM | POA: Insufficient documentation

## 2015-10-15 DIAGNOSIS — J449 Chronic obstructive pulmonary disease, unspecified: Secondary | ICD-10-CM | POA: Insufficient documentation

## 2015-10-15 DIAGNOSIS — K219 Gastro-esophageal reflux disease without esophagitis: Secondary | ICD-10-CM | POA: Insufficient documentation

## 2015-10-15 DIAGNOSIS — R29898 Other symptoms and signs involving the musculoskeletal system: Secondary | ICD-10-CM | POA: Insufficient documentation

## 2015-10-15 DIAGNOSIS — I7409 Other arterial embolism and thrombosis of abdominal aorta: Secondary | ICD-10-CM

## 2015-10-15 DIAGNOSIS — I779 Disorder of arteries and arterioles, unspecified: Secondary | ICD-10-CM | POA: Insufficient documentation

## 2015-10-15 DIAGNOSIS — F329 Major depressive disorder, single episode, unspecified: Secondary | ICD-10-CM | POA: Insufficient documentation

## 2015-10-15 MED FILL — raNITIdine HCL 150 MG TABS: 150 | 30 days supply | Qty: 60 | Fill #0

## 2015-10-15 NOTE — Progress Notes (Signed)
Vitals:   10/15/15 1333  BP: (!) 150/99  Pulse: 67  Resp: 16  Temp: 98.4 F (36.9 C)  SpO2: 100%  Weight: 145 lb (65.8 kg)  Height: 5\' 5"  (1.651 m)

## 2015-10-15 NOTE — Progress Notes (Signed)
[facility] [place]   CC: Follow up peripheral artery occlusive disease  History of Present Illness Brandy Stevenson is a 60 y.o. female patient of Dr. Kellie Simmering who returns for follow up of bilateral lower extremity PAD and known iliac artery stenosis.  Pt has not had previous peripheral vascular intervention. She has no history of nonhealing ulcers or infection.   The tired feeling in her legs starts in her thighs, travels distally to her calves, also described as heavy feeling in feet. Her lower legs feel cold all of the time. She denies non healing wounds but does have dry skin cracking at backs of both heels, no fissures. Her feet "ball up" at night.   She has a history of left breast lumpectomy for breast cancer.  She denies any history of stroke or TIA, denies hx of MI or heart disease. She states she has back pain with bending over and the same tired feeling in legs with bending at the waist. Her legs give way at times.. She was under evaluation of her back which was halted when left breast cancer found in 2009.  Her PCP is [place].  The patient reports New Medical or Surgical History: started gabapentin which has helped pain in legs.  She saw Dr. Vertell Limber, neurosurgeon, about May 2017; states he told her that she has arthritis in her neck and minor lumbar spine issues, not enough to cause the bilateral calf, thigh, and hips pain, at it's worst when she makes her bed, also present when she lies supine. She states many years ago she was told that she had a deteriorating disc in her back. She was also diagnosed a few years back with osteoporosis.    Pt Diabetic: A1C was 5.9 in January 2017 (review of records) (pre-diabetes). She has a sign of insulin resistance: acanthosis nigricans encircling her neck Pt smoker:  Smoker, about 2 cigarettes/day, (40+ yrs). She has been taking Wellbutrin since about June 2017 and has decreased her smoking. She  expresses a strong desire to stop smoking.   Pt meds include: Statin :yes ASA: Yes Other anticoagulants/antiplatelets: no   Past Medical History:  Diagnosis Date  . Arthritis    back problems to be evaluated by neurology next month  . Cancer ([place]) 2009   L, Breast, radiation and surgery.  [name] COPD (chronic obstructive pulmonary disease) ([place])   . Depression   . GERD (gastroesophageal reflux disease)   . Hyperlipemia   . Hypertension   . Peripheral vascular disease ([place]) 2013    Social History Social History  Substance Use Topics  . Smoking status: Light Tobacco Smoker    Packs/day: 0.25    Years: 40.00    Types: Cigarettes  . Smokeless tobacco: Never Used     Comment: trying to quit  . Alcohol use No    Family History Family History  Problem Relation Age of Onset  . Hyperlipidemia Mother   . Hypertension Mother   . Heart disease Mother     Atrial Fib.  . Hyperlipidemia Sister   . Hypertension Sister   . Heart disease Sister     Before age 58,  CHF  . Heart attack Brother   . Hyperlipidemia Brother   . Hypertension Brother   . Hypertension Daughter     Past Surgical History:  Procedure Laterality Date  . APPENDECTOMY  1980  . BREAST LUMPECTOMY Left    s/p radiation therapy   . BREAST SURGERY    .  COLONOSCOPY  07/2015  . COLONOSCOPY WITH PROPOFOL N/A 06/26/2015   Procedure: COLONOSCOPY WITH PROPOFOL;  Surgeon: [name], MD;  Location: [facility];  Service: Endoscopy;  Laterality: N/A;  . TUBAL LIGATION  1991    Allergies  Allergen Reactions  . Vicodin [Hydrocodone-Acetaminophen] Nausea And Vomiting and Other (See Comments)    Sweating    Current Outpatient Prescriptions  Medication Sig Dispense Refill  . albuterol (PROVENTIL HFA;VENTOLIN HFA) 108 (90 Base) MCG/ACT inhaler Inhale 2 puffs into the lungs every 6 (six) hours as needed for wheezing. 3 Inhaler 12  . aspirin EC 81 MG tablet Take 81 mg by mouth daily.    [name] atorvastatin (LIPITOR)  20 MG tablet Take 1 tablet (20 mg total) by mouth daily. 30 tablet 11  . buPROPion (WELLBUTRIN SR) 150 MG 12 hr tablet 150 mg daily for two weeks, then 300 mg daily 60 tablet 0  . diphenhydrAMINE (BENADRYL) 25 mg capsule Take 1-2 capsules (25-50 mg total) by mouth at bedtime as needed. 30 capsule 0  . escitalopram (LEXAPRO) 20 MG tablet Take 1 tablet (20 mg total) by mouth daily. 30 tablet 3  . Fluticasone-Salmeterol (ADVAIR) 100-50 MCG/DOSE AEPB Inhale 1 puff into the lungs 2 (two) times daily. 180 each 3  . gabapentin (NEURONTIN) 300 MG capsule 300 mg in the morning and 600 mg in the evening 90 capsule 3  . lisinopril-hydrochlorothiazide (PRINZIDE,ZESTORETIC) 20-12.5 MG tablet Take 1 tablet by mouth daily. 30 tablet 11  . ranitidine (ZANTAC) 150 MG tablet TAKE 1 TABLET BY MOUTH 2 TIMES DAILY. 60 tablet 2  . diphenhydramine-acetaminophen (TYLENOL PM) 25-500 MG TABS tablet Take 1 tablet by mouth at bedtime as needed (pain/sleep).    . naproxen (NAPROSYN) 500 MG tablet Take 500 mg by mouth 2 (two) times daily with a meal.     No current facility-administered medications for this visit.     ROS: See HPI for pertinent positives and negatives.   Physical Examination  Vitals:   10/15/15 1333 10/15/15 1334  BP: (!) 150/99 (!) 148/100  Pulse: 67 67  Resp: 16   Temp: 98.4 F (36.9 C)   SpO2: 100%   Weight: 145 lb (65.8 kg)   Height: 5\' 5"  (1.651 m)    Body mass index is 24.13 kg/m.  General: A&O x 3, WDWN. Gait: slow, deliberate Eyes: PERRLA. Pulmonary: CTAB, without wheezes , rales or rhonchi. Cardiac: regular rhythm, no detected murmur.     Carotid Bruits Right Left   Negative Negative  Aorta is not palpable. Radial pulses: 2+ palpable and =   VASCULAR EXAM: Extremities without ischemic changes  without Gangrene; without open wounds. Dry cracking skin at posterior aspect of heels, no  fissures.     LE Pulses Right Left   FEMORAL Not palpable 1+ palpable    POPLITEAL not palpable  not palpable   POSTERIOR TIBIAL not palpable  not palpable    DORSALIS PEDIS  ANTERIOR TIBIAL not palpable  not palpable    Abdomen: soft, NT, no palpable masses. Skin: no rashes, no ulcers. See extremities. Acanthosis nigricans at neck. Musculoskeletal: no muscle wasting or atrophy. Neurologic: A&O X 3; Appropriate Affect ; SENSATION: normal; MOTOR FUNCTION: moving all extremities equally, motor strength 5/5 throughout in UE's, 3/5 in LE's. Speech is fluent/normal. CN 2-12 grossly intact. Positive straight leg raise test in both legs.   10/28/13 CT abd/pelvis w/o contrast requested by Dr. Annitta Needs for hematuria, right flank pain: Densely calcified atherosclerotic plaque extends  along the abdominal aorta into the iliac arteries. This is advanced for age.  07/16/15 MR Lumbar spine w/o contrast requested by Dr. Vertell Limber: IMPRESSION: Mild lumbar degenerative change.  Normal alignment Small right foraminal disc protrusion L3-4 with associated disc bulging.   Non-Invasive Vascular Imaging: DATE: 10/15/2015 ABI:  RIGHT: 0.71 (0.60, 04/10/15), Waveforms: monophasic; TBI: not performed  LEFT: 0.66 (0.59), Waveforms: monophasic; TBI: not perfomred   ASSESSMENT: Markita Urrabazo is a 60 y.o. female who presents with PAD, known aortoiliac artery disease, has no signs of ischemia in her feet/legs.  She has a constellation of symptoms in her legs that are not primarily classic for claudication, even though she does have moderate bilateral arterial occlusive disease in her legs, there seems to be a significant neurological component. The symptoms in her legs are life limiting. She was evaluated by  Dr. Vertell Limber and he determined that the lumber spine issues she has are minor, not severe enough to cause the life limiting symptoms in her legs.  At pt's visit on 04/10/15, Dr. Kellie Simmering advised that If no neurological etiology for her leg sx's is found, will consider arteriogram to evaluate aortoiliac and lower extremity arterial perfusion. ABI's have improved slightly today compared to 6 months ago; this might be attributable to decreased smoking, taking Wellbutrin. She is not walking any more than usual as her hips to calves are painful with walking. Her right femoral pulse is not palpable, left is 1+ palpable.   She denies a known diagnosis of DM; unfortunately she resumed smoking.    PLAN:  The patient was counseled re smoking cessation and given several free resources re smoking cessation.   Based on the patient's vascular studies and examination, and after discussing with Dr. Trula Slade, pt will be scheduled for arteriogram with bilateral run off, possible intervention, first available vascular surgeon and date.  I discussed in depth with the patient the nature of atherosclerosis, and emphasized the importance of maximal medical management including strict control of blood pressure, blood glucose, and lipid levels, obtaining regular exercise, and cessation of smoking.  The patient is aware that without maximal medical management the underlying atherosclerotic disease process will progress, limiting the benefit of any interventions.  The patient was given information about PAD including signs, symptoms, treatment, what symptoms should prompt the patient to seek immediate medical care, and risk reduction measures to take.  Clemon Chambers, RN, MSN, FNP-C [facility] Phone: [phone]  Clinic MD: Trula Slade  10/15/15 1:46 PM

## 2015-10-15 NOTE — Patient Instructions (Signed)
Peripheral Vascular Disease Peripheral vascular disease (PVD) is a disease of the blood vessels that are not part of your heart and brain. A simple term for PVD is poor circulation. In most cases, PVD narrows the blood vessels that carry blood from your heart to the rest of your body. This can result in a decreased supply of blood to your arms, legs, and internal organs, like your stomach or kidneys. However, it most often affects a person's lower legs and feet. There are two types of PVD.  Organic PVD. This is the more common type. It is caused by damage to the structure of blood vessels.  Functional PVD. This is caused by conditions that make blood vessels contract and tighten (spasm). Without treatment, PVD tends to get worse over time. PVD can also lead to acute ischemic limb. This is when an arm or limb suddenly has trouble getting enough blood. This is a medical emergency. CAUSES Each type of PVD has many different causes. The most common cause of PVD is buildup of a fatty material (plaque) inside of your arteries (atherosclerosis). Small amounts of plaque can break off from the walls of the blood vessels and become lodged in a smaller artery. This blocks blood flow and can cause acute ischemic limb. Other common causes of PVD include:  Blood clots that form inside of blood vessels.  Injuries to blood vessels.  Diseases that cause inflammation of blood vessels or cause blood vessel spasms.  Health behaviors and health history that increase your risk of developing PVD. RISK FACTORS  You may have a greater risk of PVD if you:  Have a family history of PVD.  Have certain medical conditions, including:  High cholesterol.  Diabetes.  High blood pressure (hypertension).  Coronary heart disease.  Past problems with blood clots.  Past injury, such as burns or a broken bone. These may have damaged blood vessels in your limbs.  Buerger disease. This is caused by inflamed blood  vessels in your hands and feet.  Some forms of arthritis.  Rare birth defects that affect the arteries in your legs.  Use tobacco.  Do not get enough exercise.  Are obese.  Are age 50 or older. SIGNS AND SYMPTOMS  PVD may cause many different symptoms. Your symptoms depend on what part of your body is not getting enough blood. Some common signs and symptoms include:  Cramps in your lower legs. This may be a symptom of poor leg circulation (claudication).  Pain and weakness in your legs while you are physically active that goes away when you rest (intermittent claudication).  Leg pain when at rest.  Leg numbness, tingling, or weakness.  Coldness in a leg or foot, especially when compared with the other leg.  Skin or hair changes. These can include:  Hair loss.  Shiny skin.  Pale or bluish skin.  Thick toenails.  Inability to get or maintain an erection (erectile dysfunction). People with PVD are more prone to developing ulcers and sores on their toes, feet, or legs. These may take longer than normal to heal. DIAGNOSIS Your health care provider may diagnose PVD from your signs and symptoms. The health care provider will also do a physical exam. You may have tests to find out what is causing your PVD and determine its severity. Tests may include:  Blood pressure recordings from your arms and legs and measurements of the strength of your pulses (pulse volume recordings).  Imaging studies using sound waves to take pictures of   the blood flow through your blood vessels (Doppler ultrasound).  Injecting a dye into your blood vessels before having imaging studies using:  X-rays (angiogram or arteriogram).  Computer-generated X-rays (CT angiogram).  A powerful electromagnetic field and a computer (magnetic resonance angiogram or MRA). TREATMENT Treatment for PVD depends on the cause of your condition and the severity of your symptoms. It also depends on your age. Underlying  causes need to be treated and controlled. These include long-lasting (chronic) conditions, such as diabetes, high cholesterol, and high blood pressure. You may need to first try making lifestyle changes and taking medicines. Surgery may be needed if these do not work. Lifestyle changes may include:  Quitting smoking.  Exercising regularly.  Following a low-fat, low-cholesterol diet. Medicines may include:  Blood thinners to prevent blood clots.  Medicines to improve blood flow.  Medicines to improve your blood cholesterol levels. Surgical procedures may include:  A procedure that uses an inflated balloon to open a blocked artery and improve blood flow (angioplasty).  A procedure to put in a tube (stent) to keep a blocked artery open (stent implant).  Surgery to reroute blood flow around a blocked artery (peripheral bypass surgery).  Surgery to remove dead tissue from an infected wound on the affected limb.  Amputation. This is surgical removal of the affected limb. This may be necessary in cases of acute ischemic limb that are not improved through medical or surgical treatments. HOME CARE INSTRUCTIONS  Take medicines only as directed by your health care provider.  Do not use any tobacco products, including cigarettes, chewing tobacco, or electronic cigarettes. If you need help quitting, ask your health care provider.  Lose weight if you are overweight, and maintain a healthy weight as directed by your health care provider.  Eat a diet that is low in fat and cholesterol. If you need help, ask your health care provider.  Exercise regularly. Ask your health care provider to suggest some good activities for you.  Use compression stockings or other mechanical devices as directed by your health care provider.  Take good care of your feet.  Wear comfortable shoes that fit well.  Check your feet often for any cuts or sores. SEEK MEDICAL CARE IF:  You have cramps in your legs  while walking.  You have leg pain when you are at rest.  You have coldness in a leg or foot.  Your skin changes.  You have erectile dysfunction.  You have cuts or sores on your feet that are not healing. SEEK IMMEDIATE MEDICAL CARE IF:  Your arm or leg turns cold and blue.  Your arms or legs become red, warm, swollen, painful, or numb.  You have chest pain or trouble breathing.  You suddenly have weakness in your face, arm, or leg.  You become very confused or lose the ability to speak.  You suddenly have a very bad headache or lose your vision.   This information is not intended to replace advice given to you by your health care provider. Make sure you discuss any questions you have with your health care provider.   Document Released: 03/27/2004 Document Revised: 03/10/2014 Document Reviewed: 07/28/2013 Elsevier Interactive Patient Education 2016 Elsevier Inc[employer]     Steps to Quit Smoking  Smoking tobacco can be harmful to your health and can affect almost every organ in your body. Smoking puts you, and those around you, at risk for developing many serious chronic diseases. Quitting smoking is difficult, but it is one of   the best things that you can do for your health. It is never too late to quit. WHAT ARE THE BENEFITS OF QUITTING SMOKING? When you quit smoking, you lower your risk of developing serious diseases and conditions, such as:  Lung cancer or lung disease, such as COPD.  Heart disease.  Stroke.  Heart attack.  Infertility.  Osteoporosis and bone fractures. Additionally, symptoms such as coughing, wheezing, and shortness of breath may get better when you quit. You may also find that you get sick less often because your body is stronger at fighting off colds and infections. If you are pregnant, quitting smoking can help to reduce your chances of having a baby of low birth weight. HOW DO I GET READY TO QUIT? When you decide to quit smoking, create a plan to  make sure that you are successful. Before you quit:  Pick a date to quit. Set a date within the next two weeks to give you time to prepare.  Write down the reasons why you are quitting. Keep this list in places where you will see it often, such as on your bathroom mirror or in your car or wallet.  Identify the people, places, things, and activities that make you want to smoke (triggers) and avoid them. Make sure to take these actions:  Throw away all cigarettes at home, at work, and in your car.  Throw away smoking accessories, such as ashtrays and lighters.  Clean your car and make sure to empty the ashtray.  Clean your home, including curtains and carpets.  Tell your family, friends, and coworkers that you are quitting. Support from your loved ones can make quitting easier.  Talk with your health care provider about your options for quitting smoking.  Find out what treatment options are covered by your health insurance. WHAT STRATEGIES CAN I USE TO QUIT SMOKING?  Talk with your healthcare provider about different strategies to quit smoking. Some strategies include:  Quitting smoking altogether instead of gradually lessening how much you smoke over a period of time. Research shows that quitting "cold turkey" is more successful than gradually quitting.  Attending in-person counseling to help you build problem-solving skills. You are more likely to have success in quitting if you attend several counseling sessions. Even short sessions of 10 minutes can be effective.  Finding resources and support systems that can help you to quit smoking and remain smoke-free after you quit. These resources are most helpful when you use them often. They can include:  Online chats with a counselor.  Telephone quitlines.  Printed self-help materials.  Support groups or group counseling.  Text messaging programs.  Mobile phone applications.  Taking medicines to help you quit smoking. (If you are  pregnant or breastfeeding, talk with your health care provider first.) Some medicines contain nicotine and some do not. Both types of medicines help with cravings, but the medicines that include nicotine help to relieve withdrawal symptoms. Your health care provider may recommend:  Nicotine patches, gum, or lozenges.  Nicotine inhalers or sprays.  Non-nicotine medicine that is taken by mouth. Talk with your health care provider about combining strategies, such as taking medicines while you are also receiving in-person counseling. Using these two strategies together makes you more likely to succeed in quitting than if you used either strategy on its own. If you are pregnant or breastfeeding, talk with your health care provider about finding counseling or other support strategies to quit smoking. Do not take medicine to help you   quit smoking unless told to do so by your health care provider. WHAT THINGS CAN I DO TO MAKE IT EASIER TO QUIT? Quitting smoking might feel overwhelming at first, but there is a lot that you can do to make it easier. Take these important actions:  Reach out to your family and friends and ask that they support and encourage you during this time. Call telephone quitlines, reach out to support groups, or work with a counselor for support.  Ask people who smoke to avoid smoking around you.  Avoid places that trigger you to smoke, such as bars, parties, or smoke-break areas at work.  Spend time around people who do not smoke.  Lessen stress in your life, because stress can be a smoking trigger for some people. To lessen stress, try:  Exercising regularly.  Deep-breathing exercises.  Yoga.  Meditating.  Performing a body scan. This involves closing your eyes, scanning your body from head to toe, and noticing which parts of your body are particularly tense. Purposefully relax the muscles in those areas.  Download or purchase mobile phone or tablet apps (applications)  that can help you stick to your quit plan by providing reminders, tips, and encouragement. There are many free apps, such as QuitGuide from the CDC (Centers for Disease Control and Prevention). You can find other support for quitting smoking (smoking cessation) through smokefree.gov and other websites. HOW WILL I FEEL WHEN I QUIT SMOKING? Within the first 24 hours of quitting smoking, you may start to feel some withdrawal symptoms. These symptoms are usually most noticeable 2-3 days after quitting, but they usually do not last beyond 2-3 weeks. Changes or symptoms that you might experience include:  Mood swings.  Restlessness, anxiety, or irritation.  Difficulty concentrating.  Dizziness.  Strong cravings for sugary foods in addition to nicotine.  Mild weight gain.  Constipation.  Nausea.  Coughing or a sore throat.  Changes in how your medicines work in your body.  A depressed mood.  Difficulty sleeping (insomnia). After the first 2-3 weeks of quitting, you may start to notice more positive results, such as:  Improved sense of smell and taste.  Decreased coughing and sore throat.  Slower heart rate.  Lower blood pressure.  Clearer skin.  The ability to breathe more easily.  Fewer sick days. Quitting smoking is very challenging for most people. Do not get discouraged if you are not successful the first time. Some people need to make many attempts to quit before they achieve long-term success. Do your best to stick to your quit plan, and talk with your health care provider if you have any questions or concerns.   This information is not intended to replace advice given to you by your health care provider. Make sure you discuss any questions you have with your health care provider.   Document Released: 02/11/2001 Document Revised: 07/04/2014 Document Reviewed: 07/04/2014 Elsevier Interactive Patient Education 2016 Elsevier Inc.  

## 2015-10-22 ENCOUNTER — Encounter (HOSPITAL_COMMUNITY)
Admission: RE | Disposition: A | Discharge status: Home / Self Care | Payer: Self-pay | Source: Ambulatory Visit | Attending: Vascular Surgery

## 2015-10-22 ENCOUNTER — Ambulatory Visit (HOSPITAL_COMMUNITY)
Admission: RE | Admit: 2015-10-22 | Discharge: 2015-10-22 | Disposition: A | Discharge status: Home / Self Care | Payer: Self-pay | Source: Ambulatory Visit | Attending: Vascular Surgery | Admitting: Vascular Surgery

## 2015-10-22 DIAGNOSIS — Z8249 Family history of ischemic heart disease and other diseases of the circulatory system: Secondary | ICD-10-CM | POA: Insufficient documentation

## 2015-10-22 DIAGNOSIS — Z885 Allergy status to narcotic agent status: Secondary | ICD-10-CM | POA: Insufficient documentation

## 2015-10-22 DIAGNOSIS — E1151 Type 2 diabetes mellitus with diabetic peripheral angiopathy without gangrene: Secondary | ICD-10-CM | POA: Insufficient documentation

## 2015-10-22 DIAGNOSIS — I10 Essential (primary) hypertension: Secondary | ICD-10-CM | POA: Insufficient documentation

## 2015-10-22 DIAGNOSIS — J449 Chronic obstructive pulmonary disease, unspecified: Secondary | ICD-10-CM | POA: Insufficient documentation

## 2015-10-22 DIAGNOSIS — K219 Gastro-esophageal reflux disease without esophagitis: Secondary | ICD-10-CM | POA: Insufficient documentation

## 2015-10-22 DIAGNOSIS — Z853 Personal history of malignant neoplasm of breast: Secondary | ICD-10-CM | POA: Insufficient documentation

## 2015-10-22 DIAGNOSIS — Z7982 Long term (current) use of aspirin: Secondary | ICD-10-CM | POA: Insufficient documentation

## 2015-10-22 DIAGNOSIS — Z7951 Long term (current) use of inhaled steroids: Secondary | ICD-10-CM | POA: Insufficient documentation

## 2015-10-22 DIAGNOSIS — M5126 Other intervertebral disc displacement, lumbar region: Secondary | ICD-10-CM | POA: Insufficient documentation

## 2015-10-22 DIAGNOSIS — F17210 Nicotine dependence, cigarettes, uncomplicated: Secondary | ICD-10-CM | POA: Insufficient documentation

## 2015-10-22 DIAGNOSIS — I700 Atherosclerosis of aorta: Secondary | ICD-10-CM | POA: Insufficient documentation

## 2015-10-22 DIAGNOSIS — M1990 Unspecified osteoarthritis, unspecified site: Secondary | ICD-10-CM | POA: Insufficient documentation

## 2015-10-22 DIAGNOSIS — Z923 Personal history of irradiation: Secondary | ICD-10-CM | POA: Insufficient documentation

## 2015-10-22 DIAGNOSIS — I7409 Other arterial embolism and thrombosis of abdominal aorta: Secondary | ICD-10-CM

## 2015-10-22 DIAGNOSIS — E785 Hyperlipidemia, unspecified: Secondary | ICD-10-CM | POA: Insufficient documentation

## 2015-10-22 DIAGNOSIS — I779 Disorder of arteries and arterioles, unspecified: Secondary | ICD-10-CM

## 2015-10-22 DIAGNOSIS — F329 Major depressive disorder, single episode, unspecified: Secondary | ICD-10-CM | POA: Insufficient documentation

## 2015-10-22 HISTORY — PX: PERIPHERAL VASCULAR CATHETERIZATION: SHX172C

## 2015-10-22 LAB — POCT I-STAT, CHEM 8
BUN: 18 mg/dL (ref 6–20)
Calcium, Ion: 1.11 mmol/L — ABNORMAL LOW (ref 1.13–1.30)
Chloride: 99 mmol/L — ABNORMAL LOW (ref 101–111)
Creatinine, Ser: 0.7 mg/dL (ref 0.44–1.00)
Glucose, Bld: 83 mg/dL (ref 65–99)
HCT: 35 % — ABNORMAL LOW (ref 36.0–46.0)
Hemoglobin: 11.9 g/dL — ABNORMAL LOW (ref 12.0–15.0)
Potassium: 2.4 mmol/L — CL (ref 3.5–5.1)
Sodium: 142 mmol/L (ref 135–145)
TCO2: 30 mmol/L (ref 0–100)

## 2015-10-22 LAB — POCT ACTIVATED CLOTTING TIME
Activated Clotting Time: 186 seconds
Activated Clotting Time: 164 seconds

## 2015-10-22 SURGERY — ABDOMINAL AORTOGRAM W/LOWER EXTREMITY
Anesthesia: LOCAL

## 2015-10-22 MED ORDER — FENTANYL CITRATE (PF) 100 MCG/2ML IJ SOLN
INTRAMUSCULAR | Status: DC | PRN
  Administered 2015-10-22 (×2): 50 ug via INTRAVENOUS

## 2015-10-22 MED ORDER — LIDOCAINE HCL (PF) 1 % IJ SOLN
INTRAMUSCULAR | Status: AC
  Filled 2015-10-22: qty 30

## 2015-10-22 MED ORDER — SODIUM CHLORIDE 0.9 % IV SOLN: 1.0000 mL/kg/h | INTRAVENOUS | Status: DC

## 2015-10-22 MED ORDER — MIDAZOLAM HCL 2 MG/2ML IJ SOLN
INTRAMUSCULAR | Status: AC
  Filled 2015-10-22: qty 2

## 2015-10-22 MED ORDER — MIDAZOLAM HCL 2 MG/2ML IJ SOLN
INTRAMUSCULAR | Status: DC | PRN
  Administered 2015-10-22 (×2): 1 mg via INTRAVENOUS

## 2015-10-22 MED ORDER — HEPARIN SODIUM (PORCINE) 1000 UNIT/ML IJ SOLN
INTRAMUSCULAR | Status: AC
  Filled 2015-10-22: qty 1

## 2015-10-22 MED ORDER — POTASSIUM CHLORIDE CRYS ER 20 MEQ PO TBCR
EXTENDED_RELEASE_TABLET | ORAL | Status: AC
  Administered 2015-10-22: 20 meq via ORAL
  Filled 2015-10-22: qty 1

## 2015-10-22 MED ORDER — HEPARIN (PORCINE) IN NACL 2-0.9 UNIT/ML-% IJ SOLN
INTRAMUSCULAR | Status: AC
  Filled 2015-10-22: qty 500

## 2015-10-22 MED ORDER — HYDRALAZINE HCL 20 MG/ML IJ SOLN: 10.0000 mg | INTRAMUSCULAR | Status: DC | PRN

## 2015-10-22 MED ORDER — IODIXANOL 320 MG/ML IV SOLN
INTRAVENOUS | Status: DC | PRN
  Administered 2015-10-22: 187 mL via INTRA_ARTERIAL

## 2015-10-22 MED ORDER — LIDOCAINE HCL (PF) 1 % IJ SOLN
INTRAMUSCULAR | Status: DC | PRN
  Administered 2015-10-22: 15 mL via INTRADERMAL

## 2015-10-22 MED ORDER — HEPARIN SODIUM (PORCINE) 1000 UNIT/ML IJ SOLN
INTRAMUSCULAR | Status: DC | PRN
  Administered 2015-10-22: 4000 [IU] via INTRAVENOUS

## 2015-10-22 MED ORDER — POTASSIUM CHLORIDE CRYS ER 20 MEQ PO TBCR
20.0000 meq | EXTENDED_RELEASE_TABLET | Freq: Once | ORAL | Status: AC
  Administered 2015-10-22: 20 meq via ORAL
  Filled 2015-10-22: qty 1

## 2015-10-22 MED ORDER — FENTANYL CITRATE (PF) 100 MCG/2ML IJ SOLN
INTRAMUSCULAR | Status: AC
  Filled 2015-10-22: qty 2

## 2015-10-22 MED ORDER — HEPARIN (PORCINE) IN NACL 2-0.9 UNIT/ML-% IJ SOLN
INTRAMUSCULAR | Status: DC | PRN
  Administered 2015-10-22: 1000 mL

## 2015-10-22 MED ORDER — SODIUM CHLORIDE 0.9 % IV SOLN
INTRAVENOUS | Status: DC
  Administered 2015-10-22: 08:00:00 via INTRAVENOUS

## 2015-10-22 SURGICAL SUPPLY — 12 items
CATH ANGIO 5F BER2 65CM (CATHETERS) ×1 IMPLANT
CATH ANGIO 5F PIGTAIL 65CM (CATHETERS) ×1 IMPLANT
COVER PRB 48X5XTLSCP FOLD TPE (BAG) IMPLANT
COVER PROBE 5X48 (BAG) ×2
GUIDEWIRE ANGLED .035X150CM (WIRE) ×1 IMPLANT
KIT MICROINTRODUCER STIFF 5F (SHEATH) ×1 IMPLANT
KIT PV (KITS) ×2 IMPLANT
SHEATH PINNACLE 5F 10CM (SHEATH) ×1 IMPLANT
SYR MEDRAD MARK V 150ML (SYRINGE) ×2 IMPLANT
TRANSDUCER W/STOPCOCK (MISCELLANEOUS) ×2 IMPLANT
TRAY PV CATH (CUSTOM PROCEDURE TRAY) ×2 IMPLANT
WIRE HITORQ VERSACORE ST 145CM (WIRE) ×1 IMPLANT

## 2015-10-22 NOTE — Progress Notes (Signed)
ISTAT potassium 2.4; Eliezer Champagne, RN notified

## 2015-10-22 NOTE — H&P (View-Only) (Signed)
[facility] [place]   CC: Follow up peripheral artery occlusive disease  History of Present Illness Davonda Desario is a 60 y.o. female patient of Dr. Kellie Simmering who returns for follow up of bilateral lower extremity PAD and known iliac artery stenosis.  Pt has not had previous peripheral vascular intervention. She has no history of nonhealing ulcers or infection.   The tired feeling in her legs starts in her thighs, travels distally to her calves, also described as heavy feeling in feet. Her lower legs feel cold all of the time. She denies non healing wounds but does have dry skin cracking at backs of both heels, no fissures. Her feet "ball up" at night.   She has a history of left breast lumpectomy for breast cancer.  She denies any history of stroke or TIA, denies hx of MI or heart disease. She states she has back pain with bending over and the same tired feeling in legs with bending at the waist. Her legs give way at times.. She was under evaluation of her back which was halted when left breast cancer found in 2009.  Her PCP is [facility].  The patient reports New Medical or Surgical History: started gabapentin which has helped pain in legs.  She saw Dr. Vertell Limber, neurosurgeon, about May 2017; states he told her that she has arthritis in her neck and minor lumbar spine issues, not enough to cause the bilateral calf, thigh, and hips pain, at it's worst when she makes her bed, also present when she lies supine. She states many years ago she was told that she had a deteriorating disc in her back. She was also diagnosed a few years back with osteoporosis.    Pt Diabetic: A1C was 5.9 in January 2017 (review of records) (pre-diabetes). She has a sign of insulin resistance: acanthosis nigricans encircling her neck Pt smoker:  Smoker, about 2 cigarettes/day, (40+ yrs). She has been taking Wellbutrin since about June 2017 and has decreased her smoking. She  expresses a strong desire to stop smoking.   Pt meds include: Statin :yes ASA: Yes Other anticoagulants/antiplatelets: no   Past Medical History:  Diagnosis Date  . Arthritis    back problems to be evaluated by neurology next month  . Cancer ([place]) 2009   L, Breast, radiation and surgery.  [name] COPD (chronic obstructive pulmonary disease) ([place])   . Depression   . GERD (gastroesophageal reflux disease)   . Hyperlipemia   . Hypertension   . Peripheral vascular disease ([place]) 2013    Social History Social History  Substance Use Topics  . Smoking status: Light Tobacco Smoker    Packs/day: 0.25    Years: 40.00    Types: Cigarettes  . Smokeless tobacco: Never Used     Comment: trying to quit  . Alcohol use No    Family History Family History  Problem Relation Age of Onset  . Hyperlipidemia Mother   . Hypertension Mother   . Heart disease Mother     Atrial Fib.  . Hyperlipidemia Sister   . Hypertension Sister   . Heart disease Sister     Before age 18,  CHF  . Heart attack Brother   . Hyperlipidemia Brother   . Hypertension Brother   . Hypertension Daughter     Past Surgical History:  Procedure Laterality Date  . APPENDECTOMY  1980  . BREAST LUMPECTOMY Left    s/p radiation therapy   . BREAST SURGERY    .  COLONOSCOPY  07/2015  . COLONOSCOPY WITH PROPOFOL N/A 06/26/2015   Procedure: COLONOSCOPY WITH PROPOFOL;  Surgeon: [name], MD;  Location: [facility];  Service: Endoscopy;  Laterality: N/A;  . TUBAL LIGATION  1991    Allergies  Allergen Reactions  . Vicodin [Hydrocodone-Acetaminophen] Nausea And Vomiting and Other (See Comments)    Sweating    Current Outpatient Prescriptions  Medication Sig Dispense Refill  . albuterol (PROVENTIL HFA;VENTOLIN HFA) 108 (90 Base) MCG/ACT inhaler Inhale 2 puffs into the lungs every 6 (six) hours as needed for wheezing. 3 Inhaler 12  . aspirin EC 81 MG tablet Take 81 mg by mouth daily.    [name] atorvastatin (LIPITOR)  20 MG tablet Take 1 tablet (20 mg total) by mouth daily. 30 tablet 11  . buPROPion (WELLBUTRIN SR) 150 MG 12 hr tablet 150 mg daily for two weeks, then 300 mg daily 60 tablet 0  . diphenhydrAMINE (BENADRYL) 25 mg capsule Take 1-2 capsules (25-50 mg total) by mouth at bedtime as needed. 30 capsule 0  . escitalopram (LEXAPRO) 20 MG tablet Take 1 tablet (20 mg total) by mouth daily. 30 tablet 3  . Fluticasone-Salmeterol (ADVAIR) 100-50 MCG/DOSE AEPB Inhale 1 puff into the lungs 2 (two) times daily. 180 each 3  . gabapentin (NEURONTIN) 300 MG capsule 300 mg in the morning and 600 mg in the evening 90 capsule 3  . lisinopril-hydrochlorothiazide (PRINZIDE,ZESTORETIC) 20-12.5 MG tablet Take 1 tablet by mouth daily. 30 tablet 11  . ranitidine (ZANTAC) 150 MG tablet TAKE 1 TABLET BY MOUTH 2 TIMES DAILY. 60 tablet 2  . diphenhydramine-acetaminophen (TYLENOL PM) 25-500 MG TABS tablet Take 1 tablet by mouth at bedtime as needed (pain/sleep).    . naproxen (NAPROSYN) 500 MG tablet Take 500 mg by mouth 2 (two) times daily with a meal.     No current facility-administered medications for this visit.     ROS: See HPI for pertinent positives and negatives.   Physical Examination  Vitals:   10/15/15 1333 10/15/15 1334  BP: (!) 150/99 (!) 148/100  Pulse: 67 67  Resp: 16   Temp: 98.4 F (36.9 C)   SpO2: 100%   Weight: 145 lb (65.8 kg)   Height: 5\' 5"  (1.651 m)    Body mass index is 24.13 kg/m.  General: A&O x 3, WDWN. Gait: slow, deliberate Eyes: PERRLA. Pulmonary: CTAB, without wheezes , rales or rhonchi. Cardiac: regular rhythm, no detected murmur.     Carotid Bruits Right Left   Negative Negative  Aorta is not palpable. Radial pulses: 2+ palpable and =   VASCULAR EXAM: Extremities without ischemic changes  without Gangrene; without open wounds. Dry cracking skin at posterior aspect of heels, no  fissures.     LE Pulses Right Left   FEMORAL Not palpable 1+ palpable    POPLITEAL not palpable  not palpable   POSTERIOR TIBIAL not palpable  not palpable    DORSALIS PEDIS  ANTERIOR TIBIAL not palpable  not palpable    Abdomen: soft, NT, no palpable masses. Skin: no rashes, no ulcers. See extremities. Acanthosis nigricans at neck. Musculoskeletal: no muscle wasting or atrophy. Neurologic: A&O X 3; Appropriate Affect ; SENSATION: normal; MOTOR FUNCTION: moving all extremities equally, motor strength 5/5 throughout in UE's, 3/5 in LE's. Speech is fluent/normal. CN 2-12 grossly intact. Positive straight leg raise test in both legs.   10/28/13 CT abd/pelvis w/o contrast requested by Dr. Annitta Needs for hematuria, right flank pain: Densely calcified atherosclerotic plaque extends  along the abdominal aorta into the iliac arteries. This is advanced for age.  07/16/15 MR Lumbar spine w/o contrast requested by Dr. Vertell Limber: IMPRESSION: Mild lumbar degenerative change.  Normal alignment Small right foraminal disc protrusion L3-4 with associated disc bulging.   Non-Invasive Vascular Imaging: DATE: 10/15/2015 ABI:  RIGHT: 0.71 (0.60, 04/10/15), Waveforms: monophasic; TBI: not performed  LEFT: 0.66 (0.59), Waveforms: monophasic; TBI: not perfomred   ASSESSMENT: Jenesa Schmeer is a 60 y.o. female who presents with PAD, known aortoiliac artery disease, has no signs of ischemia in her feet/legs.  She has a constellation of symptoms in her legs that are not primarily classic for claudication, even though she does have moderate bilateral arterial occlusive disease in her legs, there seems to be a significant neurological component. The symptoms in her legs are life limiting. She was evaluated by  Dr. Vertell Limber and he determined that the lumber spine issues she has are minor, not severe enough to cause the life limiting symptoms in her legs.  At pt's visit on 04/10/15, Dr. Kellie Simmering advised that If no neurological etiology for her leg sx's is found, will consider arteriogram to evaluate aortoiliac and lower extremity arterial perfusion. ABI's have improved slightly today compared to 6 months ago; this might be attributable to decreased smoking, taking Wellbutrin. She is not walking any more than usual as her hips to calves are painful with walking. Her right femoral pulse is not palpable, left is 1+ palpable.   She denies a known diagnosis of DM; unfortunately she resumed smoking.    PLAN:  The patient was counseled re smoking cessation and given several free resources re smoking cessation.   Based on the patient's vascular studies and examination, and after discussing with Dr. Trula Slade, pt will be scheduled for arteriogram with bilateral run off, possible intervention, first available vascular surgeon and date.  I discussed in depth with the patient the nature of atherosclerosis, and emphasized the importance of maximal medical management including strict control of blood pressure, blood glucose, and lipid levels, obtaining regular exercise, and cessation of smoking.  The patient is aware that without maximal medical management the underlying atherosclerotic disease process will progress, limiting the benefit of any interventions.  The patient was given information about PAD including signs, symptoms, treatment, what symptoms should prompt the patient to seek immediate medical care, and risk reduction measures to take.  Clemon Chambers, RN, MSN, FNP-C [facility] Phone: [phone]  Clinic MD: Trula Slade  10/15/15 1:46 PM

## 2015-10-22 NOTE — Interval H&P Note (Signed)
History and Physical Interval Note:  10/22/2015 9:04 AM  Brandy Stevenson  has presented today for surgery, with the diagnosis of pvd with bilateral lower extremity claudication  The various methods of treatment have been discussed with the patient and family. After consideration of risks, benefits and other options for treatment, the patient has consented to  Procedure(s): Abdominal Aortogram w/Lower Extremity (N/A) as a surgical intervention .  The patient's history has been reviewed, patient examined, no change in status, stable for surgery.  I have reviewed the patient's chart and labs.  Questions were answered to the patient's satisfaction.     Deitra Mayo

## 2015-10-22 NOTE — Discharge Instructions (Signed)
Angiogram, Care After °Refer to this sheet in the next few weeks. These instructions provide you with information about caring for yourself after your procedure. Your health care provider may also give you more specific instructions. Your treatment has been planned according to current medical practices, but problems sometimes occur. Call your health care provider if you have any problems or questions after your procedure. °WHAT TO EXPECT AFTER THE PROCEDURE °After your procedure, it is typical to have the following: °· Bruising at the catheter insertion site that usually fades within 1-2 weeks. °· Blood collecting in the tissue (hematoma) that may be painful to the touch. It should usually decrease in size and tenderness within 1-2 weeks. °HOME CARE INSTRUCTIONS °· Take medicines only as directed by your health care provider. °· You may shower 24-48 hours after the procedure or as directed by your health care provider. Remove the bandage (dressing) and gently wash the site with plain soap and water. Pat the area dry with a clean towel. Do not rub the site, because this may cause bleeding. °· Do not take baths, swim, or use a hot tub until your health care provider approves. °· Check your insertion site every day for redness, swelling, or drainage. °· Do not apply powder or lotion to the site. °· Do not lift over 10 lb (4.5 kg) for 5 days after your procedure or as directed by your health care provider. °· Ask your health care provider when it is okay to: °¨ Return to work or school. °¨ Resume usual physical activities or sports. °¨ Resume sexual activity. °· Do not drive home if you are discharged the same day as the procedure. Have someone else drive you. °· You may drive 24 hours after the procedure unless otherwise instructed by your health care provider. °· Do not operate machinery or power tools for 24 hours after the procedure or as directed by your health care provider. °· If your procedure was done as an  outpatient procedure, which means that you went home the same day as your procedure, a responsible adult should be with you for the first 24 hours after you arrive home. °· Keep all follow-up visits as directed by your health care provider. This is important. °SEEK MEDICAL CARE IF: °· You have a fever. °· You have chills. °· You have increased bleeding from the catheter insertion site. Hold pressure on the site. °SEEK IMMEDIATE MEDICAL CARE IF: °· You have unusual pain at the catheter insertion site. °· You have redness, warmth, or swelling at the catheter insertion site. °· You have drainage (other than a small amount of blood on the dressing) from the catheter insertion site. °· The catheter insertion site is bleeding, and the bleeding does not stop after 30 minutes of holding steady pressure on the site. °· The area near or just beyond the catheter insertion site becomes pale, cool, tingly, or numb. °  °This information is not intended to replace advice given to you by your health care provider. Make sure you discuss any questions you have with your health care provider. °  °Document Released: 09/05/2004 Document Revised: 03/10/2014 Document Reviewed: 07/21/2012 °Elsevier Interactive Patient Education ©2016 [employer] ° °

## 2015-10-22 NOTE — Op Note (Signed)
PATIENT: Brandy Stevenson   MRN: [id number] DOB: Aug 16, 1955    DATE OF PROCEDURE: 10/22/2015  INDICATIONS: Keeta Omori is a 60 y.o. female who had previously been followed by Dr. Kellie Simmering. She was recently seen in our office by the nurse practitioner and Dr. Trula Slade. She was set up for an arteriogram.  PROCEDURE:  1. Ultrasound-guided access to the right common femoral artery 2. Arch aortogram 3. Aortogram with bilateral lower extremity runoff 4. Retrograde right femoral arteriogram  SURGEON: Judeth Cornfield. Scot Dock, MD, FACS  ANESTHESIA: local with sedation  EBL: Minimal  TECHNIQUE: The patient was taken to the peripheral vascular lab. She did notave femoral pulses. I elected to attempt cannulation on the right side. Both groins were prepped and draped in usual sterile fashion. The period of conscious sedation Was 47 minutes.  During that time period, I was present face-to-face 100% of the time.  The patient was administered (1 mg of Versed and 50 g of fentanyl, nitially. She later received an additional 1 mg of Versed and 50 g of fentanyl.). The patient's heart rate, blood pressure, and oxygen saturation were monitored by the nurse continuously during the procedure. The patient was also administered 4000 units of IV heparin once the catheter was placed as this was occlusive.  Under ultrasound guidance, after the skin was anesthetized, the right common femoral artery was cannulated with a micropuncture needle and a micropuncture sheath introduced over a wire. This was exchanged for a 5 French sheath over a versa core wire. The wire would initially not advance. I therefore used a Kumpe catheter and an angled Glidewire and order to advance the wire into the infrarenal aorta. The Kumpe catheter was exchanged for a pigtail catheter. Flush aortogram was obtained. Patient had an aortic occlusion. The catheter was positioned below the renals and bilateral lower extremity runoff films were  obtained. Advance the catheter into the ascending aortic arch and arch aortogram to see if the patient had any subclavian stenosis in case the patient were to be considered for excellent femoral bypass grafting. The catheter was retracted into the suprarenal aorta and a lateral aortogram was obtained. In order to obtain better visualization of the tibials on the right, the pigtail catheter was removed over a wire and a retrograde right femoral arteriogram was obtained. At the completion of the procedure the patient was transferred to the holding area for removal of the sheath. No immediate complications were noted.  FINDINGS:  1. The arch is widely patent as far the innominate artery, right subclavian artery, right vertebral artery and right common carotid artery. The left subclavian and left vertebral arteries are also patent. As no significant occlusive disease. 2. There are single renal arteries bilaterally with no significant renal artery stenosis identified. 3. The infrarenal aorta is occluded below the level of the inferior mesenteric artery. The common iliac arteries are occluded. There is reconstitution of the external iliac arteries bilaterally via hypogastric collaterals. 4. On the right side, the common femoral, deep femoral, superficial femoral and popliteal arteries are patent. There is a focal calcific plaque in the distal popliteal artery on the right. The anterior tibial arteries patent. There is a focal plaque in the proximal peroneal artery. The peroneal proximally and posterior tibial artery proximally are patent. There is poor visualization of these 2 vessels distally. 5. On the left side, the common femoral, superficial femoral, deep femoral arteries are patent. The popliteal artery, anterior tibial, tibial peroneal trunk, peroneal, posterior tibial arteries are patent.  CLINICAL NOTE: I have not previously evaluated this patient and therefore I will arrange for an office visit for new  patient evaluation to consider conservative treatment versus aortobifemoral bypass versus axillobifemoral bypass. Patient is a smoker.  Deitra Mayo, MD, FACS [facility]  DATE OF DICTATION:   10/22/2015

## 2015-10-22 NOTE — Progress Notes (Signed)
Site area: Right groin a 5 french sheath was removed  Site Prior to Removal:  Level 0  Pressure Applied For 15 MINUTES    Bedrest Beginning at 1125am  Manual:   Yes.    Patient Status During Pull:  stable  Post Pull Groin Site:  Level 0  Post Pull Instructions Given:  Yes.    Post Pull Pulses Present:  Yes.    Dressing Applied:  Yes.    Comments:  VS remain stable during sheath pull

## 2015-10-23 ENCOUNTER — Telehealth: Payer: Self-pay | Admitting: Vascular Surgery

## 2015-10-23 ENCOUNTER — Encounter (HOSPITAL_COMMUNITY): Payer: Self-pay | Admitting: Vascular Surgery

## 2015-10-23 NOTE — Telephone Encounter (Signed)
-----   Message from Mena Goes, RN sent at 10/22/2015 10:43 AM EDT ----- Regarding: FW:f/u 30 minute appt with [facility] to discuss surgery   ----- Message ----- From: [name], MD Sent: 10/22/2015  10:16 AM To: [facility] Charge Pool Subject: charge and f/u                                 PROCEDURE:  1. Ultrasound-guided access to the right common femoral artery 2. Arch aortogram 3. Aortogram with bilateral lower extremity runoff 4. Retrograde right femoral arteriogram  SURGEON: Judeth Cornfield. Scot Dock, MD, FACS  This patient was previously followed by Dr. Kellie Simmering. The patient was seen in the office by the nurse practitioner and Dr. Trula Slade and set up for an arteriogram. She needs to be considered for aortofemoral bypass grafting. I need to see her as a new patient (30 minutes).  This is not urgent. CD

## 2015-10-23 NOTE — Telephone Encounter (Signed)
Sched appt 9/21 at 10:00. Spoke to pt to inform pt of appt.

## 2015-10-25 MED FILL — LISINOPRIL-HCTZ 20-12.5 MG: 20-12.5 | 30 days supply | Qty: 30 | Fill #4

## 2015-10-25 MED FILL — ATORVASTATIN 20 MG TABLET: 20 | 30 days supply | Qty: 30 | Fill #7

## 2015-11-07 ENCOUNTER — Telehealth: Payer: Self-pay

## 2015-11-07 NOTE — Telephone Encounter (Signed)
Phone call from pt.  Reported she wanted to ask if she could resume sexual activity, following her angiogram on 8/21.  Reported her groin areas look fine; denied any swelling, redness, warmth, or discomfort.  Reported she was told she has a blockage, and wanted to make sure it would be okay to engage in sexual intercourse.  Advised since it has been 2 weeks post-procedure, and she feels the groin areas are healing, without any signs of increased redness, swelling, or bruising, it would be okay to resume sexual activity.  Pt. verb. understanding.

## 2015-11-16 ENCOUNTER — Encounter: Payer: Self-pay | Admitting: Vascular Surgery

## 2015-11-22 ENCOUNTER — Ambulatory Visit (INDEPENDENT_AMBULATORY_CARE_PROVIDER_SITE_OTHER): Payer: Self-pay | Admitting: Vascular Surgery

## 2015-11-22 ENCOUNTER — Encounter: Payer: Self-pay | Admitting: Vascular Surgery

## 2015-11-22 ENCOUNTER — Encounter: Payer: Self-pay | Admitting: Family Medicine

## 2015-11-22 ENCOUNTER — Ambulatory Visit: Payer: Self-pay | Attending: Family Medicine | Admitting: Family Medicine

## 2015-11-22 VITALS — BP 170/105 | HR 57 | Temp 97.3°F | Resp 16 | Ht 65.0 in | Wt 148.0 lb

## 2015-11-22 VITALS — BP 160/90 | HR 58 | Temp 98.0°F | Ht 65.0 in | Wt 148.6 lb

## 2015-11-22 DIAGNOSIS — I7409 Other arterial embolism and thrombosis of abdominal aorta: Secondary | ICD-10-CM

## 2015-11-22 DIAGNOSIS — F418 Other specified anxiety disorders: Secondary | ICD-10-CM | POA: Insufficient documentation

## 2015-11-22 DIAGNOSIS — I10 Essential (primary) hypertension: Secondary | ICD-10-CM | POA: Insufficient documentation

## 2015-11-22 DIAGNOSIS — F17210 Nicotine dependence, cigarettes, uncomplicated: Secondary | ICD-10-CM | POA: Insufficient documentation

## 2015-11-22 DIAGNOSIS — E876 Hypokalemia: Secondary | ICD-10-CM

## 2015-11-22 DIAGNOSIS — Z7982 Long term (current) use of aspirin: Secondary | ICD-10-CM | POA: Insufficient documentation

## 2015-11-22 DIAGNOSIS — I70219 Atherosclerosis of native arteries of extremities with intermittent claudication, unspecified extremity: Secondary | ICD-10-CM

## 2015-11-22 DIAGNOSIS — F329 Major depressive disorder, single episode, unspecified: Secondary | ICD-10-CM

## 2015-11-22 DIAGNOSIS — K219 Gastro-esophageal reflux disease without esophagitis: Secondary | ICD-10-CM

## 2015-11-22 DIAGNOSIS — F419 Anxiety disorder, unspecified: Secondary | ICD-10-CM

## 2015-11-22 DIAGNOSIS — F17200 Nicotine dependence, unspecified, uncomplicated: Secondary | ICD-10-CM

## 2015-11-22 DIAGNOSIS — I739 Peripheral vascular disease, unspecified: Secondary | ICD-10-CM | POA: Insufficient documentation

## 2015-11-22 DIAGNOSIS — F32A Depression, unspecified: Secondary | ICD-10-CM

## 2015-11-22 DIAGNOSIS — Z720 Tobacco use: Secondary | ICD-10-CM

## 2015-11-22 LAB — BASIC METABOLIC PANEL WITH GFR
BUN: 13 mg/dL (ref 7–25)
CO2: 28 mmol/L (ref 20–31)
Calcium: 9.2 mg/dL (ref 8.6–10.4)
Chloride: 100 mmol/L (ref 98–110)
Creat: 0.7 mg/dL (ref 0.50–1.05)
GFR, Est African American: >89 mL/min (ref >=60)
GFR, Est Non African American: >89 mL/min (ref >=60)
Glucose, Bld: 93 mg/dL (ref 65–99)
Potassium: 2.9 mmol/L — ABNORMAL LOW (ref 3.5–5.3)
Sodium: 141 mmol/L (ref 135–146)

## 2015-11-22 LAB — POCT URINALYSIS DIPSTICK
Bilirubin, UA: NEGATIVE
Glucose, UA: NEGATIVE
Ketones, UA: NEGATIVE
Leukocytes, UA: NEGATIVE
Nitrite, UA: NEGATIVE
Protein, UA: NEGATIVE
Spec Grav, UA: 1.01
Urobilinogen, UA: 0.2
pH, UA: 7

## 2015-11-22 MED ORDER — BUPROPION HCL ER (SR) 150 MG PO TB12: ORAL_TABLET | ORAL | 2 refills | Status: DC

## 2015-11-22 MED ORDER — BUPROPION HCL ER (SR) 150 MG PO TB12: 300.0000 mg | ORAL_TABLET | Freq: Every day | ORAL | 2 refills | Status: DC

## 2015-11-22 MED ORDER — RANITIDINE HCL 150 MG PO TABS: 150.0000 mg | ORAL_TABLET | Freq: Two times a day (BID) | ORAL | 11 refills | Status: DC

## 2015-11-22 MED ORDER — LISINOPRIL-HYDROCHLOROTHIAZIDE 20-12.5 MG PO TABS: 2.0000 | ORAL_TABLET | Freq: Every day | ORAL | 11 refills | Status: DC

## 2015-11-22 MED ORDER — ACETAMINOPHEN-CODEINE #3 300-30 MG PO TABS: 1.0000 | ORAL_TABLET | Freq: Three times a day (TID) | ORAL | 0 refills | Status: DC | PRN

## 2015-11-22 MED FILL — raNITIdine HCL 150 MG TABS: 150 | 30 days supply | Qty: 60 | Fill #0

## 2015-11-22 MED FILL — ACETAMINOPHEN/COD #3 TABLET: 300-30 | 20 days supply | Qty: 60 | Fill #0

## 2015-11-22 MED FILL — LISINOPRIL-HCTZ 20-12.5 MG: 20-12.5 | 30 days supply | Qty: 60 | Fill #0

## 2015-11-22 MED FILL — BUPROPION SR 150 MG TABLET: 150 | 30 days supply | Qty: 60 | Fill #0

## 2015-11-22 NOTE — Progress Notes (Signed)
Subjective:  Patient ID: Brandy Stevenson, female    DOB: [birth year]  Age: 60 y.o. MRN: [id number]  CC: Follow-up (smoking)   HPI Brandy Stevenson has PAD with claudication she presents for   1. Smoking cessation: she is down to 3 cigs per day. She strongly desires to quit. She is taking 150 mg of wellbutrin most days. She does not buy cigarettes. Her husband leaves her with a few when he leaves for the day.  2. HTN: taking prinzide once daily. She take ibuprofen 200 mg 4 times a day. She has slight dizziness. No HA, CP, SOB or swelling.   Social History  Substance Use Topics  . Smoking status: Light Tobacco Smoker    Packs/day: 0.25    Years: 40.00    Types: Cigarettes  . Smokeless tobacco: Never Used     Comment: 3 cigarettes per day  . Alcohol use No    Outpatient Medications Prior to Visit  Medication Sig Dispense Refill  . albuterol (PROVENTIL HFA;VENTOLIN HFA) 108 (90 Base) MCG/ACT inhaler Inhale 2 puffs into the lungs every 6 (six) hours as needed for wheezing. 3 Inhaler 12  . aspirin EC 81 MG tablet Take 81 mg by mouth daily.    [name] atorvastatin (LIPITOR) 20 MG tablet Take 1 tablet (20 mg total) by mouth daily. 30 tablet 11  . buPROPion (WELLBUTRIN SR) 150 MG 12 hr tablet 150 mg daily for two weeks, then 300 mg daily 60 tablet 0  . diphenhydrAMINE (BENADRYL) 25 mg capsule Take 1-2 capsules (25-50 mg total) by mouth at bedtime as needed. (Patient taking differently: Take 25-50 mg by mouth at bedtime as needed for sleep. ) 30 capsule 0  . diphenhydramine-acetaminophen (TYLENOL PM) 25-500 MG TABS tablet Take 1 tablet by mouth at bedtime as needed (pain/sleep).    [name] escitalopram (LEXAPRO) 20 MG tablet Take 1 tablet (20 mg total) by mouth daily. 30 tablet 3  . Fluticasone-Salmeterol (ADVAIR) 100-50 MCG/DOSE AEPB Inhale 1 puff into the lungs 2 (two) times daily. 180 each 3  . gabapentin (NEURONTIN) 300 MG capsule 300 mg in the morning and 600 mg in the evening (Patient taking  differently: Take 300-600 mg by mouth See admin instructions. 300 mg in the morning and 600 mg in the evening) 90 capsule 3  . ibuprofen (ADVIL,MOTRIN) 200 MG tablet Take 800 mg by mouth every 6 (six) hours as needed for moderate pain. Patient states she takes every day    . lisinopril-hydrochlorothiazide (PRINZIDE,ZESTORETIC) 20-12.5 MG tablet Take 1 tablet by mouth daily. 30 tablet 11  . ranitidine (ZANTAC) 150 MG tablet TAKE 1 TABLET BY MOUTH 2 TIMES DAILY. 60 tablet 2   No facility-administered medications prior to visit.     ROS Review of Systems  Constitutional: Negative for chills and fever.  Eyes: Negative for visual disturbance.  Respiratory: Negative for shortness of breath.   Cardiovascular: Negative for chest pain.  Gastrointestinal: Negative for abdominal pain and blood in stool.  Genitourinary: Positive for frequency.  Musculoskeletal: Positive for myalgias. Negative for arthralgias and back pain.  Skin: Negative for rash.  Allergic/Immunologic: Negative for immunocompromised state.  Hematological: Negative for adenopathy. Does not bruise/bleed easily.  Psychiatric/Behavioral: Positive for dysphoric mood and sleep disturbance. Negative for suicidal ideas.    Objective:  BP (!) 173/92 (BP Location: Right Arm, Patient Position: Sitting, Cuff Size: Small)   Pulse (!) 58   Temp 98 F (36.7 C) (Oral)   Ht 5\' 5"  (1.651 m)  Wt 148 lb 9.6 oz (67.4 kg)   SpO2 98%   BMI 24.73 kg/m   BP/Weight 11/22/2015 10/22/2015 99991111  Systolic BP 123XX123 [id number] [id number]  Diastolic BP 123456 77 123XX123  Wt. (Lbs) 148 145 145  BMI 24.63 24.13 24.13   .  Physical Exam  Constitutional: She is oriented to person, place, and time. She appears well-developed and well-nourished. No distress.  HENT:  Head: Normocephalic and atraumatic.  Cardiovascular: Normal rate, regular rhythm, normal heart sounds and intact distal pulses.  Exam reveals decreased pulses.   Pulmonary/Chest: Effort normal and breath  sounds normal.  Musculoskeletal: She exhibits no edema.  Neurological: She is alert and oriented to person, place, and time.  Skin: Skin is warm and dry. No rash noted.  Psychiatric: She exhibits a depressed mood.   Patient took another dose of her prinzide 20-12.5 mg x one  Repeat BP 160/90  UA: negative protein, small blood  Assessment & Plan:  Brandy Stevenson was seen today for follow-up.  Diagnoses and all orders for this visit:  Essential hypertension, benign -     lisinopril-hydrochlorothiazide (PRINZIDE,ZESTORETIC) 20-12.5 MG tablet; Take 2 tablets by mouth daily. -     BASIC METABOLIC PANEL WITH GFR  Smoking -     Discontinue: buPROPion (WELLBUTRIN SR) 150 MG 12 hr tablet; 150 mg daily for two weeks, then 300 mg daily -     buPROPion (WELLBUTRIN SR) 150 MG 12 hr tablet; Take 2 tablets (300 mg total) by mouth daily.  Anxiety and depression -     Discontinue: buPROPion (WELLBUTRIN SR) 150 MG 12 hr tablet; 150 mg daily for two weeks, then 300 mg daily -     buPROPion (WELLBUTRIN SR) 150 MG 12 hr tablet; Take 2 tablets (300 mg total) by mouth daily.   There are no diagnoses linked to this encounter.  There are no diagnoses linked to this encounter. No orders of the defined types were placed in this encounter.   Follow-up: Return in about 2 weeks (around 12/06/2015) for HTN pharmacy BP check .   Boykin Nearing MD

## 2015-11-22 NOTE — Assessment & Plan Note (Signed)
A: HTN with elevated BP today Med: compliant P: Increase prinzide to 40-25 mg daily BMP with GFR, UA

## 2015-11-22 NOTE — Progress Notes (Signed)
Pt states that she is down to 3 cigs a day.  Pt has had BP medication this morning.

## 2015-11-22 NOTE — Assessment & Plan Note (Signed)
A: smoker with COPD and PAD P: Increase Wellbutrin to 300 mg daily F/u in 1 month for smoking cessation

## 2015-11-22 NOTE — Patient Instructions (Addendum)
Brandy Stevenson was seen today for follow-up.  Diagnoses and all orders for this visit:  Atherosclerotic PVD with intermittent claudication (HCC) -     acetaminophen-codeine (TYLENOL #3) 300-30 MG tablet; Take 1 tablet by mouth every 8 (eight) hours as needed.  Essential hypertension, benign -     lisinopril-hydrochlorothiazide (PRINZIDE,ZESTORETIC) 20-12.5 MG tablet; Take 2 tablets by mouth daily. -     BASIC METABOLIC PANEL WITH GFR -     Urinalysis Dipstick  Smoking -     Discontinue: buPROPion (WELLBUTRIN SR) 150 MG 12 hr tablet; 150 mg daily for two weeks, then 300 mg daily -     buPROPion (WELLBUTRIN SR) 150 MG 12 hr tablet; Take 2 tablets (300 mg total) by mouth daily.  Anxiety and depression -     Discontinue: buPROPion (WELLBUTRIN SR) 150 MG 12 hr tablet; 150 mg daily for two weeks, then 300 mg daily -     buPROPion (WELLBUTRIN SR) 150 MG 12 hr tablet; Take 2 tablets (300 mg total) by mouth daily.  Other orders -     ranitidine (ZANTAC) 150 MG tablet; Take 1 tablet (150 mg total) by mouth 2 (two) times daily.   STOP taking ibuprofen  F/u in 2 weeks for HTN , BP check with clinical pharmacologist or with me   F/u in 1 month for smoking cessation  Dr. Adrian Blackwater

## 2015-11-22 NOTE — Progress Notes (Signed)
Patient name: Brandy Stevenson MRN: [id number] DOB: 24-Oct-1955 Sex: female  REASON FOR VISIT: Follow up of aortoiliac occlusive disease.  HPI: Brandy Stevenson is a 60 y.o. female who was previously followed by Dr. Kellie Simmering with aortoiliac occlusive disease. The patient was seen in our office by Dr. Trula Slade and set up for an arteriogram. I performed an arteriogram on 10/22/2015. This showed that her infrarenal aorta was occluded below the inferior mesenteric artery. There was reconstitution of the external iliac arteries bilaterally. She had no significant disease of the aortic arch or right or left subclavian arteries. She comes in today to discuss continued conservative treatment versus aortofemoral bypass grafting.  She complains of claudication in both calves which also extends up to her thighs and hips. This occurs about one to 2 blocks. Her symptoms are slightly more significant on the right side. She denies any history of rest pain or history of nonhealing ulcers.  Her risk factors for peripheral vascular disease include hypertension, hypercholesterolemia, a family history of premature cardiovascular disease, and tobacco use.  She is in the process of trying to quit smoking. She is down to 3 cigarettes a day. She had smoked 1-1-1/2 packs per day for over 25 years.  Past Medical History:  Diagnosis Date  . Arthritis    back problems to be evaluated by neurology next month  . Cancer ([place]) 2009   L, Breast, radiation and surgery.  [name] COPD (chronic obstructive pulmonary disease) ([place])   . Depression   . GERD (gastroesophageal reflux disease)   . Hyperlipemia   . Hypertension   . Peripheral vascular disease ([place]) 2013    Family History  Problem Relation Age of Onset  . Hyperlipidemia Mother   . Hypertension Mother   . Heart disease Mother     Atrial Fib.  . Hyperlipidemia Sister   . Hypertension Sister   . Heart disease Sister     Before age 36,  CHF  . Heart attack Brother   .  Hyperlipidemia Brother   . Hypertension Brother   . Hypertension Daughter     SOCIAL HISTORY: Social History  Substance Use Topics  . Smoking status: Light Tobacco Smoker    Packs/day: 0.25    Years: 40.00    Types: Cigarettes  . Smokeless tobacco: Never Used     Comment: 3 cigarettes per day  . Alcohol use No    Allergies  Allergen Reactions  . Vicodin [Hydrocodone-Acetaminophen] Nausea And Vomiting and Other (See Comments)    Sweating    Current Outpatient Prescriptions  Medication Sig Dispense Refill  . albuterol (PROVENTIL HFA;VENTOLIN HFA) 108 (90 Base) MCG/ACT inhaler Inhale 2 puffs into the lungs every 6 (six) hours as needed for wheezing. 3 Inhaler 12  . aspirin EC 81 MG tablet Take 81 mg by mouth daily.    [name] atorvastatin (LIPITOR) 20 MG tablet Take 1 tablet (20 mg total) by mouth daily. 30 tablet 11  . buPROPion (WELLBUTRIN SR) 150 MG 12 hr tablet 150 mg daily for two weeks, then 300 mg daily 60 tablet 0  . diphenhydrAMINE (BENADRYL) 25 mg capsule Take 1-2 capsules (25-50 mg total) by mouth at bedtime as needed. (Patient taking differently: Take 25-50 mg by mouth at bedtime as needed for sleep. ) 30 capsule 0  . diphenhydramine-acetaminophen (TYLENOL PM) 25-500 MG TABS tablet Take 1 tablet by mouth at bedtime as needed (pain/sleep).    [name] escitalopram (LEXAPRO) 20 MG tablet Take 1 tablet (20 mg total) by  mouth daily. 30 tablet 3  . Fluticasone-Salmeterol (ADVAIR) 100-50 MCG/DOSE AEPB Inhale 1 puff into the lungs 2 (two) times daily. 180 each 3  . gabapentin (NEURONTIN) 300 MG capsule 300 mg in the morning and 600 mg in the evening (Patient taking differently: Take 300-600 mg by mouth See admin instructions. 300 mg in the morning and 600 mg in the evening) 90 capsule 3  . ibuprofen (ADVIL,MOTRIN) 200 MG tablet Take 800 mg by mouth every 6 (six) hours as needed for moderate pain. Patient states she takes every day    . lisinopril-hydrochlorothiazide (PRINZIDE,ZESTORETIC)  20-12.5 MG tablet Take 1 tablet by mouth daily. 30 tablet 11  . ranitidine (ZANTAC) 150 MG tablet TAKE 1 TABLET BY MOUTH 2 TIMES DAILY. 60 tablet 2   No current facility-administered medications for this visit.     REVIEW OF SYSTEMS:  [X]  denotes positive finding, [ ]  denotes negative finding Cardiac  Comments:  Chest pain or chest pressure:    Shortness of breath upon exertion:    Short of breath when lying flat:    Irregular heart rhythm:        Vascular    Pain in calf, thigh, or hip brought on by ambulation: X Bilateral hips thighs and calves.   Pain in feet at night that wakes you up from your sleep:     Blood clot in your veins:    Leg swelling:         Pulmonary    Oxygen at home:    Productive cough:     Wheezing:         Neurologic    Sudden weakness in arms or legs:     Sudden numbness in arms or legs:     Sudden onset of difficulty speaking or slurred speech:    Temporary loss of vision in one eye:     Problems with dizziness:         Gastrointestinal    Blood in stool:     Vomited blood:         Genitourinary    Burning when urinating:     Blood in urine:        Psychiatric    Major depression:         Hematologic    Bleeding problems:    Problems with blood clotting too easily:        Skin    Rashes or ulcers:        Constitutional    Fever or chills:      PHYSICAL EXAM: Vitals:   11/22/15 0955  BP: (!) 162/100  Pulse: (!) 57  Resp: 16  Temp: 97.3 F (36.3 C)  TempSrc: Oral  SpO2: 98%  Weight: 148 lb (67.1 kg)  Height: 5\' 5"  (1.651 m)    GENERAL: The patient is a well-nourished female, in no acute distress. The vital signs are documented above. CARDIAC: There is a regular rate and rhythm.  VASCULAR: I do not detect carotid bruits.  I cannot palpate femoral, popliteal, or pedal pulses.  PULMONARY: There is good air exchange bilaterally without wheezing or rales. ABDOMEN: Soft and non-tender with normal pitched bowel sounds.    MUSCULOSKELETAL: There are no major deformities or cyanosis. NEUROLOGIC: No focal weakness or paresthesias are detected. SKIN: There are no ulcers or rashes noted. PSYCHIATRIC: The patient has a normal affect.  DATA:   The results of her arteriogram are discussed above. She has an aortic occlusion with reconstitution  of her external iliac arteries bilaterally.   MEDICAL ISSUES:  AORTOILIAC OCCLUSIVE DISEASE: This patient has an occluded infrarenal aorta. She has stable claudication. She is very close to getting off cigarettes completely and I have encouraged her to try to get off cigarettes completely. In addition we have discussed the importance of a structured walking program. We also discussed the importance of nutrition. Hopefully [facility] will be up and running soon have added her name to the list as I think she would be an excellent candidate for this. She seems very motivated. If her symptoms progressed then I think she would be a candidate for aortofemoral bypass grafting. She has had previous abdominal surgery. I'll see her back in 6 months. I ordered follow up ABIs at that time. She knows to call sooner if she has problems.  Deitra Mayo [facility] [phone]

## 2015-11-23 ENCOUNTER — Telehealth: Payer: Self-pay | Admitting: Family Medicine

## 2015-11-23 DIAGNOSIS — E876 Hypokalemia: Secondary | ICD-10-CM | POA: Insufficient documentation

## 2015-11-23 MED ORDER — POTASSIUM CHLORIDE CRYS ER 20 MEQ PO TBCR: EXTENDED_RELEASE_TABLET | ORAL | 0 refills | Status: DC

## 2015-11-23 NOTE — Telephone Encounter (Signed)
Called patient verified name and DOB  Potassium is low Normal kidney function Continue prinzide 20-12.5 mg, two tablets daily Also take potassium 40 mEq daily for next week, then 20 mEq daily, starting immediately  Come in today to pick up potassium from pharmacy Come in next week for repeat blood draw.  Patient is concerned that she does not have $ for her medications. I advised her to come in with watch she has and that I will talk with the pharmacy.   Patient agreed with plan and voiced understanding

## 2015-11-23 NOTE — Telephone Encounter (Signed)
-----   Message from Boykin Nearing, MD sent at 11/23/2015  8:15 AM EDT ----- Potassium is low Normal kidney function Continue prinzide 20-12.5 mg, two tablets daily Also take potassium 40 mEq daily for next week, then 20 mEq daily, starting immediately

## 2015-11-23 NOTE — Addendum Note (Signed)
Addended by: Boykin Nearing on: 11/23/2015 08:17 AM   Modules accepted: Orders

## 2015-11-23 NOTE — Assessment & Plan Note (Signed)
A: hypokalemia on prinzide P: Replace potassium Will plan to stop prinzide and replace with lisinopril and norvasc  Or lisinopril and aldactone

## 2015-11-24 ENCOUNTER — Encounter (HOSPITAL_COMMUNITY): Payer: Self-pay | Admitting: Emergency Medicine

## 2015-11-24 ENCOUNTER — Emergency Department (HOSPITAL_COMMUNITY)
Admission: EM | Admit: 2015-11-24 | Discharge: 2015-11-24 | Disposition: A | Discharge status: Home / Self Care | Payer: Self-pay | Attending: Emergency Medicine | Admitting: Emergency Medicine

## 2015-11-24 DIAGNOSIS — F17210 Nicotine dependence, cigarettes, uncomplicated: Secondary | ICD-10-CM | POA: Insufficient documentation

## 2015-11-24 DIAGNOSIS — E876 Hypokalemia: Secondary | ICD-10-CM | POA: Insufficient documentation

## 2015-11-24 DIAGNOSIS — Z79899 Other long term (current) drug therapy: Secondary | ICD-10-CM | POA: Insufficient documentation

## 2015-11-24 DIAGNOSIS — I10 Essential (primary) hypertension: Secondary | ICD-10-CM | POA: Insufficient documentation

## 2015-11-24 DIAGNOSIS — Z853 Personal history of malignant neoplasm of breast: Secondary | ICD-10-CM | POA: Insufficient documentation

## 2015-11-24 DIAGNOSIS — J449 Chronic obstructive pulmonary disease, unspecified: Secondary | ICD-10-CM | POA: Insufficient documentation

## 2015-11-24 DIAGNOSIS — Z7982 Long term (current) use of aspirin: Secondary | ICD-10-CM | POA: Insufficient documentation

## 2015-11-24 LAB — I-STAT TROPONIN, ED: Troponin i, poc: 0.01 ng/mL (ref 0.00–0.08)

## 2015-11-24 LAB — I-STAT CHEM 8, ED
BUN: 10 mg/dL (ref 6–20)
Calcium, Ion: 1.13 mmol/L — ABNORMAL LOW (ref 1.15–1.40)
Chloride: 99 mmol/L — ABNORMAL LOW (ref 101–111)
Creatinine, Ser: 0.6 mg/dL (ref 0.44–1.00)
Glucose, Bld: 88 mg/dL (ref 65–99)
HCT: 40 % (ref 36.0–46.0)
Hemoglobin: 13.6 g/dL (ref 12.0–15.0)
Potassium: 2.6 mmol/L — CL (ref 3.5–5.1)
Sodium: 140 mmol/L (ref 135–145)
TCO2: 28 mmol/L (ref 0–100)

## 2015-11-24 MED ORDER — POTASSIUM CHLORIDE CRYS ER 20 MEQ PO TBCR
60.0000 meq | EXTENDED_RELEASE_TABLET | Freq: Once | ORAL | Status: AC
  Administered 2015-11-24: 60 meq via ORAL
  Filled 2015-11-24: qty 3

## 2015-11-24 MED ORDER — ALBUTEROL SULFATE HFA 108 (90 BASE) MCG/ACT IN AERS
2.0000 | INHALATION_SPRAY | Freq: Once | RESPIRATORY_TRACT | Status: AC
  Administered 2015-11-24: 2 via RESPIRATORY_TRACT
  Filled 2015-11-24: qty 6.7

## 2015-11-24 NOTE — ED Provider Notes (Signed)
[facility] DEPT Provider Note   CSN: [id number] Arrival date & time: 11/24/15  1255     History   Chief Complaint Chief Complaint  Patient presents with  . Hypotension    HPI Brandy Stevenson is a 60 y.o. female.  The history is provided by the patient. No language interpreter was used.    Brandy Stevenson is a 59 y.o. female who presents to the Emergency Department complaining of hypotension.  She has a hx/o HTN and two days ago had her BP meds increased (lisinopril HCTZ increased from 20/12.5 daily to 40/25 daily).  Today she felt poorly with headache and general unwell feeling and suspected her BP was elevated.  She went to [employer] and checked her BP and it was 70/60.  Due to prior lumpectomy she had to use her right arm and had to position herself awkwardly and the BP measured off of her right forearm.  She presents to the ED due to concern for hypotension.  She currently denies any complaints and feels well.  No fever, chest pain, nausea, vomiting, numbness, weakness.  She is out of her alubterol MDI and is requesting a routine treatment.    Past Medical History:  Diagnosis Date  . Arthritis    back problems to be evaluated by neurology next month  . Cancer ([place]) 2009   L, Breast, radiation and surgery.  [name] COPD (chronic obstructive pulmonary disease) ([place])   . Depression   . GERD (gastroesophageal reflux disease)   . Hyperlipemia   . Hypertension   . Peripheral vascular disease ([place]) 2013    Patient Active Problem List   Diagnosis Date Noted  . Hypokalemia 11/23/2015  . Anxiety and depression 05/22/2015  . Insomnia 03/15/2015  . Hamstring tightness of both lower extremities 04/04/2014  . Unspecified vitamin D deficiency 04/08/2013  . Other and unspecified hyperlipidemia 04/08/2013  . Bilateral leg pain 03/22/2013  . Atherosclerotic PVD with intermittent claudication ([place]) 03/22/2013  . Essential hypertension, benign 02/23/2013  . GERD (gastroesophageal  reflux disease) 02/23/2013  . COPD (chronic obstructive pulmonary disease) ([place]) 02/23/2013  . Smoking 02/23/2013  . History of lumpectomy 02/23/2013    Past Surgical History:  Procedure Laterality Date  . APPENDECTOMY  1980  . BREAST LUMPECTOMY Left    s/p radiation therapy   . BREAST SURGERY    . COLONOSCOPY  07/2015  . COLONOSCOPY WITH PROPOFOL N/A 06/26/2015   Procedure: COLONOSCOPY WITH PROPOFOL;  Surgeon: [name], MD;  Location: [facility];  Service: Endoscopy;  Laterality: N/A;  . PERIPHERAL VASCULAR CATHETERIZATION N/A 10/22/2015   Procedure: Abdominal Aortogram w/Lower Extremity;  Surgeon: [name], MD;  Location: [facility];  Service: Cardiovascular;  Laterality: N/A;  . TUBAL LIGATION  1991    OB History    No data available       Home Medications    Prior to Admission medications   Medication Sig Start Date End Date Taking? Authorizing Provider  acetaminophen-codeine (TYLENOL #3) 300-30 MG tablet Take 1 tablet by mouth every 8 (eight) hours as needed. Patient taking differently: Take 0.5-1 tablets by mouth every 8 (eight) hours as needed for moderate pain.  11/22/15  Yes Josalyn Funches, MD  albuterol (PROVENTIL HFA;VENTOLIN HFA) 108 (90 Base) MCG/ACT inhaler Inhale 2 puffs into the lungs every 6 (six) hours as needed for wheezing. 05/03/15  Yes Boykin Nearing, MD  aspirin EC 81 MG tablet Take 81 mg by mouth daily.   Yes Historical  Provider, MD  atorvastatin (LIPITOR) 20 MG tablet Take 1 tablet (20 mg total) by mouth daily. 05/22/15  Yes Josalyn Funches, MD  buPROPion (WELLBUTRIN SR) 150 MG 12 hr tablet Take 2 tablets (300 mg total) by mouth daily. 11/22/15  Yes Josalyn Funches, MD  diphenhydrAMINE (BENADRYL) 25 mg capsule Take 1-2 capsules (25-50 mg total) by mouth at bedtime as needed. Patient taking differently: Take 25-50 mg by mouth at bedtime as needed for sleep.  09/10/15  Yes Josalyn Funches, MD  escitalopram (LEXAPRO) 20 MG tablet  Take 1 tablet (20 mg total) by mouth daily. 05/22/15  Yes Josalyn Funches, MD  Fluticasone-Salmeterol (ADVAIR) 100-50 MCG/DOSE AEPB Inhale 1 puff into the lungs 2 (two) times daily. 05/03/15  Yes Josalyn Funches, MD  gabapentin (NEURONTIN) 300 MG capsule 300 mg in the morning and 600 mg in the evening Patient taking differently: Take 300-600 mg by mouth See admin instructions. 300 mg in the morning and 600 mg in the evening 09/10/15  Yes Josalyn Funches, MD  lisinopril-hydrochlorothiazide (PRINZIDE,ZESTORETIC) 20-12.5 MG tablet Take 2 tablets by mouth daily. 11/22/15  Yes Josalyn Funches, MD  ranitidine (ZANTAC) 150 MG tablet Take 1 tablet (150 mg total) by mouth 2 (two) times daily. 11/22/15  Yes Boykin Nearing, MD  potassium chloride SA (K-DUR,KLOR-CON) 20 MEQ tablet Take by mouth two tablets once daily for one week, then 1 tablet daily for next week 11/23/15   Boykin Nearing, MD    Family History Family History  Problem Relation Age of Onset  . Hyperlipidemia Mother   . Hypertension Mother   . Heart disease Mother     Atrial Fib.  . Hyperlipidemia Sister   . Hypertension Sister   . Heart disease Sister     Before age 70,  CHF  . Heart attack Brother   . Hyperlipidemia Brother   . Hypertension Brother   . Hypertension Daughter     Social History Social History  Substance Use Topics  . Smoking status: Light Tobacco Smoker    Packs/day: 0.25    Years: 40.00    Types: Cigarettes  . Smokeless tobacco: Never Used     Comment: 3 cigarettes per day  . Alcohol use No     Allergies   Vicodin [hydrocodone-acetaminophen]   Review of Systems Review of Systems  All other systems reviewed and are negative.    Physical Exam Updated Vital Signs BP 132/86 (BP Location: Right Arm)   Pulse (!) 55   Temp 97.9 F (36.6 C) (Oral)   Resp 18   Ht 5\' 5"  (1.651 m)   Wt 148 lb (67.1 kg)   SpO2 100%   BMI 24.63 kg/m   Physical Exam  Constitutional: She is oriented to person, place,  and time. She appears well-developed and well-nourished.  HENT:  Head: Normocephalic and atraumatic.  Cardiovascular: Normal rate and regular rhythm.   No murmur heard. Pulmonary/Chest: Effort normal and breath sounds normal. No respiratory distress.  Abdominal: Soft. There is no tenderness. There is no rebound and no guarding.  Musculoskeletal: She exhibits no edema or tenderness.  Neurological: She is alert and oriented to person, place, and time.  Skin: Skin is warm and dry.  Psychiatric: She has a normal mood and affect. Her behavior is normal.  Nursing note and vitals reviewed.    ED Treatments / Results  Labs (all labs ordered are listed, but only abnormal results are displayed) Labs Reviewed  I-STAT CHEM 8, ED - Abnormal; Notable for the  following:       Result Value   Potassium 2.6 (*)    Chloride 99 (*)    Calcium, Ion 1.13 (*)    All other components within normal limits  I-STAT TROPOININ, ED    EKG  EKG Interpretation  Date/Time:  Saturday November 24 2015 15:27:45 EDT Ventricular Rate:  51 PR Interval:    QRS Duration: 98 QT Interval:  444 QTC Calculation: 409 R Axis:   -7 Text Interpretation:  Sinus rhythm Confirmed by Hazle Coca [phone]) on 11/24/2015 3:37:20 PM       Radiology No results found.  Procedures Procedures (including critical care time)  Medications Ordered in ED Medications  albuterol (PROVENTIL HFA;VENTOLIN HFA) 108 (90 Base) MCG/ACT inhaler 2 puff (2 puffs Inhalation Given 11/24/15 1602)  potassium chloride SA (K-DUR,KLOR-CON) CR tablet 60 mEq (60 mEq Oral Given 11/24/15 1636)     Initial Impression / Assessment and Plan / ED Course  I have reviewed the triage vital signs and the nursing notes.  Pertinent labs & imaging results that were available during my care of the patient were reviewed by me and considered in my medical decision making (see chart for details).  Clinical Course    Pt here for evaluation after feeling poorly  at home, concern for hypotension PTA.  Pt normo to hypertensive in ED, asymptomatic.  Pt notes difficulty in using BP cuff at [facility] and BP is not thought to be accurate.  She is noted to be hypokalemic and will not be able to fill her recently prescribed potassium supplement until Monday.  Will replace potassium in ED with plan to start her supplement at home with close outpatient follow up and return precautions.    Presentation not c/w ACS, CVA, hypertensive urgency, acute hypotension, sepsis.    Final Clinical Impressions(s) / ED Diagnoses   Final diagnoses:  Hypokalemia    New Prescriptions New Prescriptions   No medications on file     Quintella Reichert, MD 11/24/15 1715

## 2015-11-24 NOTE — ED Triage Notes (Signed)
Pt states that she recently went to her primary care doctor who upped her dose of lisinopril to two pills and she feels like it is making her BP too low and she has a headache. Pt denies dizziness, weakness, or SHOB.

## 2015-11-24 NOTE — ED Notes (Signed)
MD notified of critical potassium level  

## 2015-11-30 MED FILL — ATORVASTATIN 20 MG TABLET: 20 | 30 days supply | Qty: 30 | Fill #8

## 2015-12-05 NOTE — Progress Notes (Deleted)
   S:    Patient arrives ***.    Presents to the clinic for hypertension evaluation. Patient was referred on 11/22/15.  Patient was last seen by Primary Care Provider on 11/22/15.   Of note, she went to the ED on 11/24/15 for hypotension. It is believed the blood pressure reading at [place] [facility] was inaccurate and she was found to be normotensive to hypertensive in the ED.  Patient {Actions; denies-reports:120008} adherence with medications.  Current BP Medications include:  Lisinopril-HCTZ 20-12.5 mg 2 tablets daily. Is currently on potassium supplement to help with hypokalemia.   Dietary habits include:  O:   Last 3 Office BP readings: BP Readings from Last 3 Encounters:  11/24/15 140/95  11/22/15 (!) 160/90  11/22/15 (!) 170/105    BMET    Component Value Date/Time   NA 140 11/24/2015 1619   K 2.6 (LL) 11/24/2015 1619   CL 99 (L) 11/24/2015 1619   CO2 28 11/22/2015 1431   GLUCOSE 88 11/24/2015 1619   BUN 10 11/24/2015 1619   CREATININE 0.60 11/24/2015 1619   CREATININE 0.70 11/22/2015 1431   CALCIUM 9.2 11/22/2015 1431   GFRNONAA >89 11/22/2015 1431   GFRAA >89 11/22/2015 1431    A/P: Hypertension longstanding/newly diagnosed currently *** on current medications.  {Meds adjust:18428} ***. Will obtain BMET today.  Results reviewed and written information provided.   Total time in face-to-face counseling *** minutes.   F/U Clinic Visit with Dr. [name]  Patient seen with Biagio Borg, PharmD Candidate

## 2015-12-06 ENCOUNTER — Ambulatory Visit: Payer: Self-pay | Admitting: Pharmacist

## 2016-01-01 MED FILL — raNITIdine HCL 150 MG TABS: 150 | 30 days supply | Qty: 60 | Fill #1

## 2016-01-01 MED FILL — LISINOPRIL-HCTZ 20-12.5 MG: 20-12.5 | 30 days supply | Qty: 60 | Fill #1

## 2016-01-01 MED FILL — ?ATORVASTATIN 20 MG TABLET: 20 | 30 days supply | Qty: 30 | Fill #9

## 2016-01-02 MED FILL — VENTOLIN HFA 90 MCG INHALER: 108 (90 BAS | 30 days supply | Qty: 18 | Fill #2

## 2016-01-15 ENCOUNTER — Ambulatory Visit: Payer: Medicare Other | Admitting: Family Medicine

## 2016-02-04 MED FILL — raNITIdine HCL 150 MG TABS: 150 | 30 days supply | Qty: 60 | Fill #2

## 2016-02-04 MED FILL — LISINOPRIL-HCTZ 20-12.5 MG: 20-12.5 | 30 days supply | Qty: 60 | Fill #2

## 2016-02-04 MED FILL — ATORVASTATIN 20 MG TABLET: 20 | 30 days supply | Qty: 30 | Fill #10

## 2016-02-05 NOTE — Addendum Note (Signed)
Addended by: Lianne Cure A on: 02/05/2016 09:01 AM   Modules accepted: Orders

## 2016-02-18 ENCOUNTER — Ambulatory Visit: Payer: Commercial Managed Care - HMO | Attending: Family Medicine | Admitting: Family Medicine

## 2016-02-18 ENCOUNTER — Encounter: Payer: Self-pay | Admitting: Family Medicine

## 2016-02-18 VITALS — BP 103/63 | HR 62 | Temp 98.4°F | Ht 65.0 in | Wt 146.4 lb

## 2016-02-18 DIAGNOSIS — J418 Mixed simple and mucopurulent chronic bronchitis: Secondary | ICD-10-CM

## 2016-02-18 DIAGNOSIS — Z79899 Other long term (current) drug therapy: Secondary | ICD-10-CM | POA: Insufficient documentation

## 2016-02-18 DIAGNOSIS — L84 Corns and callosities: Secondary | ICD-10-CM | POA: Diagnosis not present

## 2016-02-18 DIAGNOSIS — E7800 Pure hypercholesterolemia, unspecified: Secondary | ICD-10-CM | POA: Diagnosis not present

## 2016-02-18 DIAGNOSIS — K219 Gastro-esophageal reflux disease without esophagitis: Secondary | ICD-10-CM | POA: Diagnosis not present

## 2016-02-18 DIAGNOSIS — F17200 Nicotine dependence, unspecified, uncomplicated: Secondary | ICD-10-CM

## 2016-02-18 DIAGNOSIS — I70219 Atherosclerosis of native arteries of extremities with intermittent claudication, unspecified extremity: Secondary | ICD-10-CM

## 2016-02-18 DIAGNOSIS — Z7982 Long term (current) use of aspirin: Secondary | ICD-10-CM | POA: Insufficient documentation

## 2016-02-18 DIAGNOSIS — Z853 Personal history of malignant neoplasm of breast: Secondary | ICD-10-CM | POA: Diagnosis not present

## 2016-02-18 DIAGNOSIS — Z0001 Encounter for general adult medical examination with abnormal findings: Secondary | ICD-10-CM | POA: Diagnosis not present

## 2016-02-18 DIAGNOSIS — E876 Hypokalemia: Secondary | ICD-10-CM | POA: Insufficient documentation

## 2016-02-18 DIAGNOSIS — F17210 Nicotine dependence, cigarettes, uncomplicated: Secondary | ICD-10-CM | POA: Diagnosis not present

## 2016-02-18 DIAGNOSIS — I10 Essential (primary) hypertension: Secondary | ICD-10-CM | POA: Insufficient documentation

## 2016-02-18 MED ORDER — ALBUTEROL SULFATE HFA 108 (90 BASE) MCG/ACT IN AERS: 2.0000 | INHALATION_SPRAY | Freq: Four times a day (QID) | RESPIRATORY_TRACT | 3 refills | Status: DC | PRN

## 2016-02-18 MED ORDER — GABAPENTIN 300 MG PO CAPS: 300.0000 mg | ORAL_CAPSULE | ORAL | 3 refills | Status: DC

## 2016-02-18 MED ORDER — RANITIDINE HCL 150 MG PO TABS: 150.0000 mg | ORAL_TABLET | Freq: Two times a day (BID) | ORAL | 3 refills | Status: DC

## 2016-02-18 MED ORDER — ASPIRIN EC 81 MG PO TBEC: 81.0000 mg | DELAYED_RELEASE_TABLET | Freq: Every day | ORAL | 3 refills | Status: DC

## 2016-02-18 MED ORDER — ATORVASTATIN CALCIUM 20 MG PO TABS: 20.0000 mg | ORAL_TABLET | Freq: Every day | ORAL | 3 refills | Status: DC

## 2016-02-18 MED ORDER — NAPROXEN 500 MG PO TABS: 500.0000 mg | ORAL_TABLET | Freq: Two times a day (BID) | ORAL | 0 refills | Status: DC

## 2016-02-18 MED ORDER — FLUTICASONE-SALMETEROL 100-50 MCG/DOSE IN AEPB: 1.0000 | INHALATION_SPRAY | Freq: Two times a day (BID) | RESPIRATORY_TRACT | 3 refills | Status: DC

## 2016-02-18 MED ORDER — NAPROXEN 500 MG PO TABS: 500.0000 mg | ORAL_TABLET | Freq: Two times a day (BID) | ORAL | 5 refills | Status: DC

## 2016-02-18 NOTE — Assessment & Plan Note (Signed)
Continue to smokes Compliant with wellbutrin  Plan: Continue wellbutrin Quit date set

## 2016-02-18 NOTE — Patient Instructions (Addendum)
Brandy Stevenson was seen today for nicotine dependence and hypertension.  Diagnoses and all orders for this visit:  Hypokalemia -     BASIC METABOLIC PANEL WITH GFR  Pure hypercholesterolemia -     atorvastatin (LIPITOR) 20 MG tablet; Take 1 tablet (20 mg total) by mouth daily.  Gastroesophageal reflux disease without esophagitis -     ranitidine (ZANTAC) 150 MG tablet; Take 1 tablet (150 mg total) by mouth 2 (two) times daily.  Atherosclerotic PVD with intermittent claudication (HCC) -     aspirin EC 81 MG tablet; Take 1 tablet (81 mg total) by mouth daily. -     gabapentin (NEURONTIN) 300 MG capsule; Take 1-2 capsules (300-600 mg total) by mouth See admin instructions. 300 mg in the morning and 600 mg in the evening  Mixed simple and mucopurulent chronic bronchitis (HCC) -     albuterol (PROVENTIL HFA;VENTOLIN HFA) 108 (90 Base) MCG/ACT inhaler; Inhale 2 puffs into the lungs every 6 (six) hours as needed for wheezing. -     Fluticasone-Salmeterol (ADVAIR) 100-50 MCG/DOSE AEPB; Inhale 1 puff into the lungs 2 (two) times daily.  Foot callus -     Ambulatory referral to Podiatry  Other orders -     Discontinue: naproxen (NAPROSYN) 500 MG tablet; Take 1 tablet (500 mg total) by mouth 2 (two) times daily with a meal. -     naproxen (NAPROSYN) 500 MG tablet; Take 1 tablet (500 mg total) by mouth 2 (two) times daily with a meal.   Set quit date: quit date on 03/03/2016    You will be called with lab results if potassium is normal will send prinzide to [facility]   F/u in 6 weeks for smoking cessation   Dr. Adrian Blackwater

## 2016-02-18 NOTE — Assessment & Plan Note (Signed)
Callus on R foot No ulcer PAD  Podiatry referral placed

## 2016-02-18 NOTE — Progress Notes (Signed)
Subjective:  Patient ID: Brandy Stevenson, female    DOB: 1955/05/27  Age: 60 y.o. MRN: [id number]  CC: Nicotine Dependence and Hypertension   HPI Brandy Stevenson has PAD with claudication she presents for   1. Smoking cessation: she is down to 5 cigs per day. She strongly desires to quit. She is taking 150 mg of wellbutrin BID every day. She does not buy cigarettes. Her husband leaves her with a few when he leaves for the day. She feels ready to quit smoking. She is starting a walking exercise plan this week. She has callus on her R foot that hurt when she walks. No sores or lesions.   2. HTN: taking prinzide once daily. She has slight dizziness. No HA, CP, SOB or swelling.   Social History  Substance Use Topics  . Smoking status: Light Tobacco Smoker    Packs/day: 0.25    Years: 40.00    Types: Cigarettes  . Smokeless tobacco: Never Used     Comment: 3 cigarettes per day  . Alcohol use No    Outpatient Medications Prior to Visit  Medication Sig Dispense Refill  . acetaminophen-codeine (TYLENOL #3) 300-30 MG tablet Take 1 tablet by mouth every 8 (eight) hours as needed. (Patient taking differently: Take 0.5-1 tablets by mouth every 8 (eight) hours as needed for moderate pain. ) 60 tablet 0  . albuterol (PROVENTIL HFA;VENTOLIN HFA) 108 (90 Base) MCG/ACT inhaler Inhale 2 puffs into the lungs every 6 (six) hours as needed for wheezing. 3 Inhaler 12  . aspirin EC 81 MG tablet Take 81 mg by mouth daily.    [name] atorvastatin (LIPITOR) 20 MG tablet Take 1 tablet (20 mg total) by mouth daily. 30 tablet 11  . buPROPion (WELLBUTRIN SR) 150 MG 12 hr tablet Take 2 tablets (300 mg total) by mouth daily. 60 tablet 2  . diphenhydrAMINE (BENADRYL) 25 mg capsule Take 1-2 capsules (25-50 mg total) by mouth at bedtime as needed. (Patient taking differently: Take 25-50 mg by mouth at bedtime as needed for sleep. ) 30 capsule 0  . escitalopram (LEXAPRO) 20 MG tablet Take 1 tablet (20 mg total) by mouth  daily. 30 tablet 3  . Fluticasone-Salmeterol (ADVAIR) 100-50 MCG/DOSE AEPB Inhale 1 puff into the lungs 2 (two) times daily. 180 each 3  . gabapentin (NEURONTIN) 300 MG capsule 300 mg in the morning and 600 mg in the evening (Patient taking differently: Take 300-600 mg by mouth See admin instructions. 300 mg in the morning and 600 mg in the evening) 90 capsule 3  . lisinopril-hydrochlorothiazide (PRINZIDE,ZESTORETIC) 20-12.5 MG tablet Take 2 tablets by mouth daily. 60 tablet 11  . potassium chloride SA (K-DUR,KLOR-CON) 20 MEQ tablet Take by mouth two tablets once daily for one week, then 1 tablet daily for next week 21 tablet 0  . ranitidine (ZANTAC) 150 MG tablet Take 1 tablet (150 mg total) by mouth 2 (two) times daily. 60 tablet 11   No facility-administered medications prior to visit.     ROS Review of Systems  Constitutional: Negative for chills and fever.  Eyes: Negative for visual disturbance.  Respiratory: Negative for shortness of breath.   Cardiovascular: Negative for chest pain.  Gastrointestinal: Negative for abdominal pain and blood in stool.  Genitourinary: Positive for frequency.  Musculoskeletal: Positive for arthralgias (R shoulder pain ) and myalgias. Negative for back pain.  Skin: Negative for rash.  Allergic/Immunologic: Negative for immunocompromised state.  Hematological: Negative for adenopathy. Does not bruise/bleed easily.  Psychiatric/Behavioral: Positive for dysphoric mood and sleep disturbance. Negative for suicidal ideas.    Objective:  BP 103/63 (BP Location: Left Arm, Patient Position: Sitting, Cuff Size: Small)   Pulse 62   Temp 98.4 F (36.9 C) (Oral)   Ht 5\' 5"  (1.651 m)   Wt 146 lb 6.4 oz (66.4 kg)   BMI 24.36 kg/m   BP/Weight 02/18/2016 11/24/2015 123456  Systolic BP [id number]  Diastolic BP 63 95 90  Wt. (Lbs) 146.4 148 148.6  BMI 24.36 24.63 24.73   . Physical Exam  Constitutional: She is oriented to person, place, and time. She  appears well-developed and well-nourished. No distress.  HENT:  Head: Normocephalic and atraumatic.  Cardiovascular: Normal rate, regular rhythm, normal heart sounds and intact distal pulses.  Exam reveals decreased pulses.   Pulmonary/Chest: Effort normal and breath sounds normal.  Musculoskeletal: She exhibits no edema.  Neurological: She is alert and oriented to person, place, and time.  Skin: Skin is warm and dry. No rash noted.  Psychiatric: She exhibits a depressed mood.     Depression screen [facility] 2/9 11/22/2015 11/22/2015 09/12/2015 09/12/2015 05/22/2015  Decreased Interest 2 2 3 3 3   Down, Depressed, Hopeless 2 2 1 1 2   PHQ - 2 Score 4 4 4 4 5   Altered sleeping 3 - 3 - 3  Tired, decreased energy 3 - 3 - 3  Change in appetite 0 - 1 - 3  Feeling bad or failure about yourself  0 - 0 - 3  Trouble concentrating 2 - 0 - 3  Moving slowly or fidgety/restless 0 - 0 - 2  Suicidal thoughts 0 - 0 - 0  PHQ-9 Score 12 - 11 - 22    GAD 7 : Generalized Anxiety Score 11/22/2015 09/12/2015 05/22/2015 03/15/2015  Nervous, Anxious, on Edge 2 0 3 0  Control/stop worrying 2 0 3 1  Worry too much - different things 2 0 3 1  Trouble relaxing 2 0 3 3  Restless 2 0 3 1  Easily annoyed or irritable 2 0 3 1  Afraid - awful might happen 0 0 3 0  Total GAD 7 Score 12 0 21 7  Anxiety Difficulty - - Not difficult at all -     Assessment & Plan:  Diagnoses and all orders for this visit:  Hypokalemia -     BASIC METABOLIC PANEL WITH GFR   There are no diagnoses linked to this encounter.  Diagnoses and all orders for this visit:  Hypokalemia -     BASIC METABOLIC PANEL WITH GFR   No orders of the defined types were placed in this encounter.   Follow-up: No Follow-up on file.   Boykin Nearing MD

## 2016-02-18 NOTE — Assessment & Plan Note (Addendum)
Well controlled Compliant with regimen Has recent hypokalemia on prinzide Checking K+ today Continue prinzide 40-25 mg daily for now   K + 3.1 on prinzide. Plan, DC prinzide.  Change to losartan 100 mg daily, noting patient also has COPD and desiring to avoid concerns of ACE inhibitor exacerbating cough in the future.

## 2016-02-19 LAB — BASIC METABOLIC PANEL WITH GFR
BUN: 16 mg/dL (ref 7–25)
CO2: 26 mmol/L (ref 20–31)
Calcium: 9.2 mg/dL (ref 8.6–10.4)
Chloride: 104 mmol/L (ref 98–110)
Creat: 0.84 mg/dL (ref 0.50–1.05)
GFR, Est African American: 88 mL/min (ref >=60)
GFR, Est Non African American: 76 mL/min (ref >=60)
Glucose, Bld: 83 mg/dL (ref 65–99)
Potassium: 3.1 mmol/L — ABNORMAL LOW (ref 3.5–5.3)
Sodium: 140 mmol/L (ref 135–146)

## 2016-02-19 MED ORDER — LOSARTAN POTASSIUM 100 MG PO TABS: 100.0000 mg | ORAL_TABLET | Freq: Every day | ORAL | 3 refills | Status: DC

## 2016-02-19 NOTE — Addendum Note (Signed)
Addended by: Boykin Nearing on: 02/19/2016 08:45 AM   Modules accepted: Orders

## 2016-02-20 ENCOUNTER — Telehealth: Payer: Self-pay

## 2016-02-20 NOTE — Telephone Encounter (Signed)
Pt was called and informed of results and medication being sent over to her pharmacy.

## 2016-02-20 NOTE — Telephone Encounter (Signed)
Pt was called and informed of lab results. 

## 2016-02-27 ENCOUNTER — Telehealth: Payer: Self-pay | Admitting: Family Medicine

## 2016-02-27 DIAGNOSIS — E876 Hypokalemia: Secondary | ICD-10-CM

## 2016-02-27 DIAGNOSIS — I10 Essential (primary) hypertension: Secondary | ICD-10-CM

## 2016-02-27 NOTE — Telephone Encounter (Signed)
Patient is calling regarding the change in her bp med. She takes lisinopril and was instead prescribed losartan. She needs clarification on the change of medicine.  Please follow up

## 2016-02-28 MED FILL — NAPROXEN 500 MG TABLET: 500 | 30 days supply | Qty: 60 | Fill #0

## 2016-02-29 NOTE — Telephone Encounter (Signed)
Changed to losartan at last OV  Since patient has COPD Lisinopril can cause cough Losartan will not Be advised to change to losartan

## 2016-02-29 NOTE — Telephone Encounter (Signed)
Will route to pcp 

## 2016-03-05 NOTE — Telephone Encounter (Signed)
Pt was called and informed of reason medication was changed pt states that she understands.

## 2016-03-14 ENCOUNTER — Ambulatory Visit: Payer: Medicare Other | Admitting: Podiatry

## 2016-03-23 ENCOUNTER — Emergency Department (HOSPITAL_COMMUNITY)
Admission: EM | Admit: 2016-03-23 | Discharge: 2016-03-24 | Disposition: A | Discharge status: Home / Self Care | Payer: Medicare HMO | Attending: Emergency Medicine | Admitting: Emergency Medicine

## 2016-03-23 ENCOUNTER — Encounter (HOSPITAL_COMMUNITY): Payer: Self-pay | Admitting: Emergency Medicine

## 2016-03-23 ENCOUNTER — Emergency Department (HOSPITAL_COMMUNITY): Payer: Medicare HMO

## 2016-03-23 DIAGNOSIS — F17210 Nicotine dependence, cigarettes, uncomplicated: Secondary | ICD-10-CM | POA: Insufficient documentation

## 2016-03-23 DIAGNOSIS — I10 Essential (primary) hypertension: Secondary | ICD-10-CM | POA: Diagnosis not present

## 2016-03-23 DIAGNOSIS — Z7982 Long term (current) use of aspirin: Secondary | ICD-10-CM | POA: Insufficient documentation

## 2016-03-23 DIAGNOSIS — J449 Chronic obstructive pulmonary disease, unspecified: Secondary | ICD-10-CM | POA: Diagnosis not present

## 2016-03-23 DIAGNOSIS — Z853 Personal history of malignant neoplasm of breast: Secondary | ICD-10-CM | POA: Diagnosis not present

## 2016-03-23 DIAGNOSIS — R609 Edema, unspecified: Secondary | ICD-10-CM | POA: Diagnosis present

## 2016-03-23 LAB — CBC WITH DIFFERENTIAL/PLATELET
Basophils Absolute: 0 10*3/uL (ref 0.0–0.1)
Basophils Relative: 1 %
Eosinophils Absolute: 0.1 10*3/uL (ref 0.0–0.7)
Eosinophils Relative: 2 %
HCT: 33 % — ABNORMAL LOW (ref 36.0–46.0)
Hemoglobin: 11 g/dL — ABNORMAL LOW (ref 12.0–15.0)
Lymphocytes Relative: 46 %
Lymphs Abs: 2.8 10*3/uL (ref 0.7–4.0)
MCH: 31.3 pg (ref 26.0–34.0)
MCHC: 33.3 g/dL (ref 30.0–36.0)
MCV: 93.8 fL (ref 78.0–100.0)
Monocytes Absolute: 0.5 10*3/uL (ref 0.1–1.0)
Monocytes Relative: 9 %
Neutro Abs: 2.5 10*3/uL (ref 1.7–7.7)
Neutrophils Relative %: 42 %
Platelets: 182 10*3/uL (ref 150–400)
RBC: 3.52 MIL/uL — ABNORMAL LOW (ref 3.87–5.11)
RDW: 14.8 % (ref 11.5–15.5)
WBC: 5.9 10*3/uL (ref 4.0–10.5)

## 2016-03-23 LAB — BASIC METABOLIC PANEL
Anion gap: 7 (ref 5–15)
BUN: 14 mg/dL (ref 6–20)
CO2: 24 mmol/L (ref 22–32)
Calcium: 9.2 mg/dL (ref 8.9–10.3)
Chloride: 110 mmol/L (ref 101–111)
Creatinine, Ser: 0.74 mg/dL (ref 0.44–1.00)
GFR calc Af Amer: >60 mL/min (ref >60)
GFR calc non Af Amer: >60 mL/min (ref >60)
Glucose, Bld: 91 mg/dL (ref 65–99)
Potassium: 3.4 mmol/L — ABNORMAL LOW (ref 3.5–5.1)
Sodium: 141 mmol/L (ref 135–145)

## 2016-03-23 MED ORDER — LISINOPRIL-HYDROCHLOROTHIAZIDE 20-12.5 MG PO TABS: 1.0000 | ORAL_TABLET | Freq: Every day | ORAL | 1 refills | Status: DC

## 2016-03-23 MED ORDER — AMLODIPINE BESYLATE 5 MG PO TABS
5.0000 mg | ORAL_TABLET | Freq: Once | ORAL | Status: AC
  Administered 2016-03-23: 5 mg via ORAL
  Filled 2016-03-23: qty 1

## 2016-03-23 NOTE — Discharge Instructions (Signed)
Follow up with your doctor this week, go back to your lisinopril HCTZ medication, return as needed for shortness of breath, worsening symptoms

## 2016-03-23 NOTE — ED Provider Notes (Signed)
[facility] DEPT Provider Note   CSN: [id number] Arrival date & time: 03/23/16  1839     History   Chief Complaint Chief Complaint  Patient presents with  . fluid retention    HPI Anneelizabeth Sprow is a 61 y.o. female.  HPI Pt used to take a combination ace inhibitor diuretic(lisinopril 20/12.5).  Her doctor switcher her to cozaar about two Stevenson ago.  She is not sure why she was switched.  She has noticed over the last few days puffiness in her face.  Her legs feel swollen.  No CP or SOB.  Her BP has been elevated today but she did take her medications.   Past Medical History:  Diagnosis Date  . Arthritis    back problems to be evaluated by neurology next month  . Cancer ([place]) 2009   L, Breast, radiation and surgery.  [name] COPD (chronic obstructive pulmonary disease) ([place])   . Depression   . GERD (gastroesophageal reflux disease)   . Hyperlipemia   . Hypertension   . Peripheral vascular disease ([place]) 2013    Patient Active Problem List   Diagnosis Date Noted  . History of breast cancer 02/18/2016  . Foot callus 02/18/2016  . Hypokalemia 11/23/2015  . Anxiety and depression 05/22/2015  . Insomnia 03/15/2015  . Hamstring tightness of both lower extremities 04/04/2014  . Unspecified vitamin D deficiency 04/08/2013  . Other and unspecified hyperlipidemia 04/08/2013  . Bilateral leg pain 03/22/2013  . Atherosclerotic PVD with intermittent claudication ([place]) 03/22/2013  . Essential hypertension, benign 02/23/2013  . GERD (gastroesophageal reflux disease) 02/23/2013  . COPD (chronic obstructive pulmonary disease) ([place]) 02/23/2013  . Smoking 02/23/2013  . History of lumpectomy 02/23/2013    Past Surgical History:  Procedure Laterality Date  . APPENDECTOMY  1980  . BREAST LUMPECTOMY Left    s/p radiation therapy   . BREAST SURGERY    . COLONOSCOPY  07/2015  . COLONOSCOPY WITH PROPOFOL N/A 06/26/2015   Procedure: COLONOSCOPY WITH PROPOFOL;  Surgeon: [name],  MD;  Location: [facility];  Service: Endoscopy;  Laterality: N/A;  . PERIPHERAL VASCULAR CATHETERIZATION N/A 10/22/2015   Procedure: Abdominal Aortogram w/Lower Extremity;  Surgeon: [name], MD;  Location: [facility];  Service: Cardiovascular;  Laterality: N/A;  . TUBAL LIGATION  1991    OB History    No data available       Home Medications    Prior to Admission medications   Medication Sig Start Date End Date Taking? Authorizing Provider  acetaminophen-codeine (TYLENOL #3) 300-30 MG tablet Take 1 tablet by mouth every 8 (eight) hours as needed. Patient taking differently: Take 0.5-1 tablets by mouth every 8 (eight) hours as needed for moderate pain.  11/22/15  Yes Josalyn Funches, MD  albuterol (PROVENTIL HFA;VENTOLIN HFA) 108 (90 Base) MCG/ACT inhaler Inhale 2 puffs into the lungs every 6 (six) hours as needed for wheezing. 02/18/16  Yes Boykin Nearing, MD  aspirin EC 81 MG tablet Take 1 tablet (81 mg total) by mouth daily. 02/18/16  Yes Josalyn Funches, MD  atorvastatin (LIPITOR) 20 MG tablet Take 1 tablet (20 mg total) by mouth daily. 02/18/16  Yes Josalyn Funches, MD  buPROPion (WELLBUTRIN SR) 150 MG 12 hr tablet Take 2 tablets (300 mg total) by mouth daily. 11/22/15  Yes Josalyn Funches, MD  diphenhydrAMINE (BENADRYL) 25 mg capsule Take 1-2 capsules (25-50 mg total) by mouth at bedtime as needed. Patient taking differently: Take 25-50 mg by mouth at bedtime  as needed for sleep.  09/10/15  Yes Josalyn Funches, MD  Fluticasone-Salmeterol (ADVAIR) 100-50 MCG/DOSE AEPB Inhale 1 puff into the lungs 2 (two) times daily. 02/18/16  Yes Josalyn Funches, MD  gabapentin (NEURONTIN) 300 MG capsule Take 1-2 capsules (300-600 mg total) by mouth See admin instructions. 300 mg in the morning and 600 mg in the evening Patient taking differently: Take 300 mg by mouth at bedtime.  02/18/16  Yes Josalyn Funches, MD  naproxen (NAPROSYN) 500 MG tablet Take 1 tablet (500 mg total) by  mouth 2 (two) times daily with a meal. 02/18/16  Yes Josalyn Funches, MD  ranitidine (ZANTAC) 150 MG tablet Take 1 tablet (150 mg total) by mouth 2 (two) times daily. 02/18/16  Yes Josalyn Funches, MD  lisinopril-hydrochlorothiazide (ZESTORETIC) 20-12.5 MG tablet Take 1 tablet by mouth daily. 03/23/16   Dorie Rank, MD  potassium chloride SA (K-DUR,KLOR-CON) 20 MEQ tablet Take by mouth two tablets once daily for one week, then 1 tablet daily for next week 11/23/15   Boykin Nearing, MD    Family History Family History  Problem Relation Age of Onset  . Hyperlipidemia Mother   . Hypertension Mother   . Heart disease Mother     Atrial Fib.  . Hyperlipidemia Sister   . Hypertension Sister   . Heart disease Sister     Before age 61,  CHF  . Heart attack Brother   . Hyperlipidemia Brother   . Hypertension Brother   . Hypertension Daughter     Social History Social History  Substance Use Topics  . Smoking status: Light Tobacco Smoker    Packs/day: 0.25    Years: 40.00    Types: Cigarettes  . Smokeless tobacco: Never Used     Comment: 3 cigarettes per day  . Alcohol use No     Allergies   Vicodin [hydrocodone-acetaminophen]   Review of Systems Review of Systems  All other systems reviewed and are negative.    Physical Exam Updated Vital Signs BP (!) 204/115 (BP Location: Right Arm)   Pulse 72   Temp 98.2 F (36.8 C) (Oral)   Resp 19   SpO2 99%   Physical Exam  Constitutional: She appears well-developed and well-nourished. No distress.  HENT:  Head: Normocephalic and atraumatic.  Right Ear: External ear normal.  Left Ear: External ear normal.  Eyes: Conjunctivae are normal. Right eye exhibits no discharge. Left eye exhibits no discharge. No scleral icterus.  Neck: Neck supple. No JVD present. No tracheal deviation present.  Cardiovascular: Normal rate, regular rhythm and intact distal pulses.   Pulmonary/Chest: Effort normal and breath sounds normal. No stridor. No  respiratory distress. She has no wheezes. She has no rales.  Abdominal: Soft. Bowel sounds are normal. She exhibits no distension. There is no tenderness. There is no rebound and no guarding.  Musculoskeletal: She exhibits no edema or tenderness.  Neurological: She is alert. She has normal strength. No cranial nerve deficit (no facial droop, extraocular movements intact, no slurred speech) or sensory deficit. She exhibits normal muscle tone. She displays no seizure activity. Coordination normal.  Skin: Skin is warm and dry. No rash noted.  Psychiatric: She has a normal mood and affect.  Nursing note and vitals reviewed.    ED Treatments / Results  Labs (all labs ordered are listed, but only abnormal results are displayed) Labs Reviewed  CBC WITH DIFFERENTIAL/PLATELET - Abnormal; Notable for the following:       Result Value   RBC  3.52 (*)    Hemoglobin 11.0 (*)    HCT 33.0 (*)    All other components within normal limits  BASIC METABOLIC PANEL - Abnormal; Notable for the following:    Potassium 3.4 (*)    All other components within normal limits    Radiology Dg Chest 2 View  Result Date: 03/23/2016 CLINICAL DATA:  61 year old female with hypertension and facial swelling. EXAM: CHEST  2 VIEW COMPARISON:  Chest radiograph dated 08/30/2015 FINDINGS: The lungs are clear. There is no pleural effusion or pneumothorax. The cardiac silhouette is within normal limits. There is mild atherosclerotic calcification of the aortic arch. No acute osseous pathology identified. IMPRESSION: No active cardiopulmonary disease. Electronically Signed   By: Anner Crete M.D.   On: 03/23/2016 23:29    Procedures Procedures (including critical care time)  Medications Ordered in ED Medications  amLODipine (NORVASC) tablet 5 mg (5 mg Oral Given 03/23/16 2302)     Initial Impression / Assessment and Plan / ED Course  I have reviewed the triage vital signs and the nursing notes.  Pertinent labs &  imaging results that were available during my care of the patient were reviewed by me and considered in my medical decision making (see chart for details).  Clinical Course as of Mar 24 2351  Nancy Fetter Mar 23, 2016  2344 BP is now 181/98.    Decreasing after Norvasc dose  [JK]    Clinical Course User Index [JK] Dorie Rank, MD   Patient presented to the emergency room with uncontrolled hypertension. Patient states she had been managed well on lisinopril and hydrochlorothiazide. She is not exactly sure why her doctor changed her to a new medication. I reviewed the notes from the clinic appointment. It looks like there is a concern about Ace inhibitor induced coughing. Patient does not recall having any side effects. She felt like her blood pressure was better controlled while she was on that medication. Here in the emergency room she doesn't show any evidence of pulmonary edema. I do not see any edema of the face specifically. I suspect her symptoms are related to her hypertension. I will discharge her home with a prescription for lisinopril HCTZ.  Final Clinical Impressions(s) / ED Diagnoses   Final diagnoses:  Hypertension, unspecified type    New Prescriptions New Prescriptions   LISINOPRIL-HYDROCHLOROTHIAZIDE (ZESTORETIC) 20-12.5 MG TABLET    Take 1 tablet by mouth daily.     Dorie Rank, MD 03/23/16 [phone]

## 2016-03-23 NOTE — ED Triage Notes (Signed)
Patient states that over the past 3-4 days she has swelling in her face. Patient believes it is fluid retention due to her having a medication change 2 weeks ago and was taken off BP med with diuretic in it.  Patient states that she has cough that is her baseline for her COPD and still smokes.

## 2016-03-24 NOTE — Telephone Encounter (Signed)
Pt calling stating she went to the ED last night due to her blood pressure/bp medication  Pt wants to inform PCP that ED physician left notes for PCP to read over ED Physician switched pts BP medication to Lisinopril  Pt would like to speak to PCP or nurse for advice on what she should do, either begin taking the Lisinopril right away or to wait a day or two.

## 2016-03-26 NOTE — Telephone Encounter (Signed)
Will route to PCP 

## 2016-03-27 MED ORDER — POTASSIUM CHLORIDE CRYS ER 20 MEQ PO TBCR: 20.0000 meq | EXTENDED_RELEASE_TABLET | Freq: Every day | ORAL | 5 refills | Status: DC

## 2016-03-27 NOTE — Telephone Encounter (Signed)
Please inform patient that her medication was changed from l;isinopril -HCTZ combo  to cozaar last visit due to low potassium.  She is advised to start back on lisinopril-HCTZ combo since her BP was better controlled with it, but she  will also need to take a potassium supplement daily, lose doe 20 mEq that I have sent to her pharmacy.   She should start right away.

## 2016-03-27 NOTE — Assessment & Plan Note (Signed)
Please inform patient that her medication was changed from prinzide to cozaar last visit due to low potassium.  She is advised to start back on the prinzide since her BP was better controlled with prinzide but she will also need to take a potassium supplement daily, lose doe 20 mEq that I have sent to her pharmacy.

## 2016-03-27 NOTE — Telephone Encounter (Signed)
Pt was called and informed of medication change and medication being sent over to her pharmacy. Pt states that she would like to have a MRI of her head due to pain in her head.

## 2016-03-31 ENCOUNTER — Other Ambulatory Visit: Payer: Self-pay | Admitting: Family Medicine

## 2016-03-31 DIAGNOSIS — E876 Hypokalemia: Secondary | ICD-10-CM

## 2016-03-31 DIAGNOSIS — I10 Essential (primary) hypertension: Secondary | ICD-10-CM

## 2016-03-31 MED ORDER — POTASSIUM CHLORIDE CRYS ER 20 MEQ PO TBCR: 20.0000 meq | EXTENDED_RELEASE_TABLET | Freq: Every day | ORAL | 5 refills | Status: DC

## 2016-04-02 ENCOUNTER — Ambulatory Visit: Payer: Medicare HMO | Attending: Internal Medicine | Admitting: Physician Assistant

## 2016-04-02 VITALS — BP 136/90 | HR 94 | Temp 97.9°F | Resp 16 | Ht 65.0 in | Wt 139.0 lb

## 2016-04-02 DIAGNOSIS — R519 Headache, unspecified: Secondary | ICD-10-CM

## 2016-04-02 DIAGNOSIS — Z79899 Other long term (current) drug therapy: Secondary | ICD-10-CM | POA: Insufficient documentation

## 2016-04-02 DIAGNOSIS — I10 Essential (primary) hypertension: Secondary | ICD-10-CM | POA: Diagnosis not present

## 2016-04-02 DIAGNOSIS — E876 Hypokalemia: Secondary | ICD-10-CM | POA: Diagnosis not present

## 2016-04-02 DIAGNOSIS — J069 Acute upper respiratory infection, unspecified: Secondary | ICD-10-CM | POA: Insufficient documentation

## 2016-04-02 DIAGNOSIS — R51 Headache: Secondary | ICD-10-CM

## 2016-04-02 DIAGNOSIS — Z7982 Long term (current) use of aspirin: Secondary | ICD-10-CM | POA: Diagnosis not present

## 2016-04-02 MED ORDER — AZITHROMYCIN 250 MG PO TABS: ORAL_TABLET | ORAL | 0 refills | Status: DC

## 2016-04-02 MED FILL — AZITHROMYCIN 250 MG TABLET: 250 | 5 days supply | Qty: 6 | Fill #0

## 2016-04-02 NOTE — Progress Notes (Signed)
"  Feels like fluid is in my head and something in my throat making me feel nauseated"

## 2016-04-02 NOTE — Progress Notes (Signed)
Chief Complaint: 'I was in the Emergency room"  Subjective: 61 year old female who was recently in the emergency department with increasing puffiness, increasing swelling and elevated blood pressure. She had recently been changed from [place] to Cozaar. Her blood pressure in the ED was 204/115. Her hemoglobin was 11. Her potassium was 3.4. In the emergency department they treated her with Norvasc with improvement in her blood pressure to 181/98. She was changed back to lisinopril/HCTZ at discharge. It looks like she contacted our office around 03/27/2016 and her potassium was addressed. She still has not received the potassium from her mail pharmacy [facility]).  At home she has no couple she's making that her mission to get one with her next paycheck.  Currently she stating that she has some type of "bug". She has congestion. She has phlegm production. Her right ear feels full. Her throat is sore. She has been having some headaches that she's concerned about. She continues to smoke.   ROS:  GEN: denies fever or chills, denies change in weight Skin: denies lesions or rashes HEENT: + headache, +earache, epistaxis, +sore throat, or neck pain LUNGS: denies SHOB, dyspnea, PND, orthopnea CV: denies CP or palpitations ABD: denies abd pain, N or V EXT: denies muscle spasms or swelling; no pain in lower ext, no weakness NEURO: denies numbness or tingling, denies sz, stroke or TIA   Objective:  Vitals:   04/02/16 0847  BP: 136/90  Pulse: 94  Resp: 16  Temp: 97.9 F (36.6 C)  TempSrc: Oral  SpO2: 99%  Weight: 139 lb (63 kg)  Height: 5\' 5"  (1.651 m)    Physical Exam:  General: in no acute distress. HEENT: no pallor, no icterus, moist oral mucosa but injected, no JVD, no lymphadenopathy Heart: Normal  s1 &s2  Regular rate and rhythm, without murmurs, rubs, gallops. Lungs: Clear to auscultation bilaterally. Abdomen: Soft, nontender, nondistended, positive bowel sounds. Extremities: No  clubbing cyanosis or edema with positive pedal pulses. Neuro: Alert, awake, oriented x3, nonfocal.  Medications: Prior to Admission medications   Medication Sig Start Date End Date Taking? Authorizing Provider  acetaminophen-codeine (TYLENOL #3) 300-30 MG tablet Take 1 tablet by mouth every 8 (eight) hours as needed. Patient taking differently: Take 0.5-1 tablets by mouth every 8 (eight) hours as needed for moderate pain.  11/22/15   Josalyn Funches, MD  albuterol (PROVENTIL HFA;VENTOLIN HFA) 108 (90 Base) MCG/ACT inhaler Inhale 2 puffs into the lungs every 6 (six) hours as needed for wheezing. 02/18/16   Boykin Nearing, MD  aspirin EC 81 MG tablet Take 1 tablet (81 mg total) by mouth daily. 02/18/16   Josalyn Funches, MD  atorvastatin (LIPITOR) 20 MG tablet Take 1 tablet (20 mg total) by mouth daily. 02/18/16   Josalyn Funches, MD  azithromycin (ZITHROMAX) 250 MG tablet 2 tabs today; Then 1 tab daily thereafter for 4 days 04/02/16   Brayton Caves, PA-C  buPROPion [facility] SR) 150 MG 12 hr tablet Take 2 tablets (300 mg total) by mouth daily. 11/22/15   Josalyn Funches, MD  diphenhydrAMINE (BENADRYL) 25 mg capsule Take 1-2 capsules (25-50 mg total) by mouth at bedtime as needed. Patient taking differently: Take 25-50 mg by mouth at bedtime as needed for sleep.  09/10/15   Josalyn Funches, MD  Fluticasone-Salmeterol (ADVAIR) 100-50 MCG/DOSE AEPB Inhale 1 puff into the lungs 2 (two) times daily. 02/18/16   Josalyn Funches, MD  gabapentin (NEURONTIN) 300 MG capsule Take 1-2 capsules (300-600 mg total) by mouth See admin instructions.  300 mg in the morning and 600 mg in the evening Patient taking differently: Take 300 mg by mouth at bedtime.  02/18/16   Josalyn Funches, MD  lisinopril-hydrochlorothiazide (ZESTORETIC) 20-12.5 MG tablet Take 1 tablet by mouth daily. 03/23/16   Dorie Rank, MD  naproxen (NAPROSYN) 500 MG tablet Take 1 tablet (500 mg total) by mouth 2 (two) times daily with a meal. 02/18/16    Josalyn Funches, MD  potassium chloride SA (K-DUR,KLOR-CON) 20 MEQ tablet Take 1 tablet (20 mEq total) by mouth daily. 03/31/16   Josalyn Funches, MD  ranitidine (ZANTAC) 150 MG tablet Take 1 tablet (150 mg total) by mouth 2 (two) times daily. 02/18/16   Boykin Nearing, MD    Assessment: 1. URI 2. HTN-improved 3. Hypokalemia 4. Smoker  Plan: Z-pack at pts request Cont current regimen RF modification Smoking cessation K+order should be in this week BMP and f/u in 2 weeks with PCP  Follow up: 2 weeks  The patient was given clear instructions to go to ER or return to medical center if symptoms don't improve, worsen or new problems develop. The patient verbalized understanding. The patient was told to call to get lab results if they haven't heard anything in the next week.   This note has been created with Surveyor, quantity. Any transcriptional errors are unintentional.   Zettie Pho, PA-C 04/02/2016, 11:18 AM

## 2016-04-03 ENCOUNTER — Other Ambulatory Visit: Payer: Self-pay | Admitting: Family Medicine

## 2016-04-03 DIAGNOSIS — Z1231 Encounter for screening mammogram for malignant neoplasm of breast: Secondary | ICD-10-CM

## 2016-04-17 ENCOUNTER — Ambulatory Visit: Payer: Medicare HMO | Attending: Family Medicine | Admitting: Family Medicine

## 2016-04-17 ENCOUNTER — Encounter: Payer: Self-pay | Admitting: Family Medicine

## 2016-04-17 VITALS — BP 110/70 | HR 60 | Temp 98.1°F | Ht 65.0 in | Wt 144.6 lb

## 2016-04-17 DIAGNOSIS — M47812 Spondylosis without myelopathy or radiculopathy, cervical region: Secondary | ICD-10-CM | POA: Insufficient documentation

## 2016-04-17 DIAGNOSIS — F17200 Nicotine dependence, unspecified, uncomplicated: Secondary | ICD-10-CM | POA: Diagnosis not present

## 2016-04-17 DIAGNOSIS — Z0001 Encounter for general adult medical examination with abnormal findings: Secondary | ICD-10-CM | POA: Diagnosis not present

## 2016-04-17 DIAGNOSIS — K219 Gastro-esophageal reflux disease without esophagitis: Secondary | ICD-10-CM | POA: Diagnosis not present

## 2016-04-17 DIAGNOSIS — F329 Major depressive disorder, single episode, unspecified: Secondary | ICD-10-CM | POA: Diagnosis not present

## 2016-04-17 DIAGNOSIS — K449 Diaphragmatic hernia without obstruction or gangrene: Secondary | ICD-10-CM

## 2016-04-17 DIAGNOSIS — I10 Essential (primary) hypertension: Secondary | ICD-10-CM | POA: Diagnosis not present

## 2016-04-17 DIAGNOSIS — F418 Other specified anxiety disorders: Secondary | ICD-10-CM

## 2016-04-17 DIAGNOSIS — E876 Hypokalemia: Secondary | ICD-10-CM | POA: Insufficient documentation

## 2016-04-17 DIAGNOSIS — Z79899 Other long term (current) drug therapy: Secondary | ICD-10-CM | POA: Insufficient documentation

## 2016-04-17 DIAGNOSIS — M4712 Other spondylosis with myelopathy, cervical region: Secondary | ICD-10-CM | POA: Diagnosis not present

## 2016-04-17 DIAGNOSIS — F32A Depression, unspecified: Secondary | ICD-10-CM

## 2016-04-17 DIAGNOSIS — Z7982 Long term (current) use of aspirin: Secondary | ICD-10-CM | POA: Insufficient documentation

## 2016-04-17 DIAGNOSIS — F17210 Nicotine dependence, cigarettes, uncomplicated: Secondary | ICD-10-CM | POA: Diagnosis not present

## 2016-04-17 DIAGNOSIS — R1319 Other dysphagia: Secondary | ICD-10-CM | POA: Insufficient documentation

## 2016-04-17 DIAGNOSIS — F419 Anxiety disorder, unspecified: Secondary | ICD-10-CM | POA: Diagnosis not present

## 2016-04-17 DIAGNOSIS — R1310 Dysphagia, unspecified: Secondary | ICD-10-CM | POA: Insufficient documentation

## 2016-04-17 LAB — BASIC METABOLIC PANEL WITH GFR
BUN: 13 mg/dL (ref 7–25)
CO2: 26 mmol/L (ref 20–31)
Calcium: 9.3 mg/dL (ref 8.6–10.4)
Chloride: 102 mmol/L (ref 98–110)
Creat: 0.77 mg/dL (ref 0.50–1.05)
GFR, Est African American: >89 mL/min (ref >=60)
GFR, Est Non African American: 85 mL/min (ref >=60)
Glucose, Bld: 88 mg/dL (ref 65–99)
Potassium: 3.4 mmol/L — ABNORMAL LOW (ref 3.5–5.3)
Sodium: 139 mmol/L (ref 135–146)

## 2016-04-17 MED ORDER — BUPROPION HCL ER (SR) 150 MG PO TB12
150.0000 mg | ORAL_TABLET | Freq: Two times a day (BID) | ORAL | 2 refills | Status: DC
Start: 2016-04-17 — End: 2016-06-17

## 2016-04-17 MED ORDER — MELOXICAM 7.5 MG PO TABS: 7.5000 mg | ORAL_TABLET | Freq: Every day | ORAL | 0 refills | Status: DC

## 2016-04-17 MED ORDER — NAPROXEN 500 MG PO TABS: 500.0000 mg | ORAL_TABLET | Freq: Two times a day (BID) | ORAL | 5 refills | Status: DC

## 2016-04-17 NOTE — Progress Notes (Signed)
Subjective:  Patient ID: Brandy Stevenson, female    DOB: 1955-04-05  Age: 61 y.o. MRN: [id number]  CC: Hypertension   HPI [name] has COPD,  PAD with claudication, HTN, current smoker  she presents for   1. Smoking cessation: she has stopped Wellbutrin. Increase smoking. Feels more depressed. Would like to start Wellbutrin again.   2. HTN: taking prinzide once daily. No HA, CP, SOB or swelling.   3. Trouble swallowing: for past several month. Worsening. She has difficulty swallowing burger. She denies fever, chills, weight loss. She used to drink alochol regularly, but stopped many years ago.  4. Cervical DJD: chronic with pain in both shoulders. She has been referred to neurosurgery. Has been lost to follow up.  Cervical MRI 07/16/2015: IMPRESSION: Moderately large central and right-sided disc protrusion C3-4 with cord flattening and mild spinal stenosis. Mild right foraminal stenosis with possible impingement right C4 nerve root.  Mild spinal and foraminal stenosis bilaterally C4-5 and C5-6.  Social History  Substance Use Topics  . Smoking status: Light Tobacco Smoker    Packs/day: 0.25    Years: 40.00    Types: Cigarettes  . Smokeless tobacco: Never Used     Comment: 3 cigarettes per day  . Alcohol use No    Outpatient Medications Prior to Visit  Medication Sig Dispense Refill  . acetaminophen-codeine (TYLENOL #3) 300-30 MG tablet Take 1 tablet by mouth every 8 (eight) hours as needed. (Patient taking differently: Take 0.5-1 tablets by mouth every 8 (eight) hours as needed for moderate pain. ) 60 tablet 0  . albuterol (PROVENTIL HFA;VENTOLIN HFA) 108 (90 Base) MCG/ACT inhaler Inhale 2 puffs into the lungs every 6 (six) hours as needed for wheezing. 3 Inhaler 3  . aspirin EC 81 MG tablet Take 1 tablet (81 mg total) by mouth daily. 90 tablet 3  . atorvastatin (LIPITOR) 20 MG tablet Take 1 tablet (20 mg total) by mouth daily. 90 tablet 3  . diphenhydrAMINE (BENADRYL)  25 mg capsule Take 1-2 capsules (25-50 mg total) by mouth at bedtime as needed. (Patient taking differently: Take 25-50 mg by mouth at bedtime as needed for sleep. ) 30 capsule 0  . Fluticasone-Salmeterol (ADVAIR) 100-50 MCG/DOSE AEPB Inhale 1 puff into the lungs 2 (two) times daily. 180 each 3  . gabapentin (NEURONTIN) 300 MG capsule Take 1-2 capsules (300-600 mg total) by mouth See admin instructions. 300 mg in the morning and 600 mg in the evening (Patient taking differently: Take 300 mg by mouth at bedtime. ) 270 capsule 3  . lisinopril-hydrochlorothiazide (ZESTORETIC) 20-12.5 MG tablet Take 1 tablet by mouth daily. 30 tablet 1  . naproxen (NAPROSYN) 500 MG tablet Take 1 tablet (500 mg total) by mouth 2 (two) times daily with a meal. 60 tablet 5  . potassium chloride SA (K-DUR,KLOR-CON) 20 MEQ tablet Take 1 tablet (20 mEq total) by mouth daily. 30 tablet 5  . ranitidine (ZANTAC) 150 MG tablet Take 1 tablet (150 mg total) by mouth 2 (two) times daily. 180 tablet 3  . azithromycin (ZITHROMAX) 250 MG tablet 2 tabs today; Then 1 tab daily thereafter for 4 days (Patient not taking: Reported on 04/17/2016) 6 tablet 0  . buPROPion (WELLBUTRIN SR) 150 MG 12 hr tablet Take 2 tablets (300 mg total) by mouth daily. (Patient not taking: Reported on 04/17/2016) 60 tablet 2   No facility-administered medications prior to visit.     ROS Review of Systems  Constitutional: Negative for chills and fever.  HENT: Positive for trouble swallowing.   Eyes: Negative for visual disturbance.  Respiratory: Negative for shortness of breath.   Cardiovascular: Negative for chest pain.  Gastrointestinal: Negative for abdominal pain and blood in stool.  Genitourinary: Negative for frequency.  Musculoskeletal: Positive for arthralgias (R shoulder pain ), myalgias and neck pain. Negative for back pain.  Skin: Negative for rash.  Allergic/Immunologic: Negative for immunocompromised state.  Hematological: Negative for  adenopathy. Does not bruise/bleed easily.  Psychiatric/Behavioral: Positive for dysphoric mood and sleep disturbance. Negative for suicidal ideas.    Objective:  BP 110/70 Comment: manual R arm  Pulse 60   Temp 98.1 F (36.7 C) (Oral)   Ht 5\' 5"  (1.651 m)   Wt 144 lb 9.6 oz (65.6 kg)   SpO2 99%   BMI 24.06 kg/m   BP/Weight 04/17/2016 04/02/2016 [id number]  Systolic BP [id number]  Diastolic BP 70 90 98  Wt. (Lbs) 144.6 139 -  BMI 24.06 23.13 -   . Physical Exam  Constitutional: She is oriented to person, place, and time. She appears well-developed and well-nourished. No distress.  HENT:  Head: Normocephalic and atraumatic.  Cardiovascular: Normal rate, regular rhythm, normal heart sounds and intact distal pulses.  Exam reveals decreased pulses.   Pulmonary/Chest: Effort normal and breath sounds normal.  Musculoskeletal: She exhibits no edema.  Neurological: She is alert and oriented to person, place, and time.  Skin: Skin is warm and dry. No rash noted.  Psychiatric: She exhibits a depressed mood.     Depression screen [facility] 2/9 04/02/2016 02/18/2016 11/22/2015 11/22/2015 09/12/2015  Decreased Interest 2 2 2 2 3   Down, Depressed, Hopeless 1 2 2 2 1   PHQ - 2 Score 3 4 4 4 4   Altered sleeping 3 3 3  - 3  Tired, decreased energy 3 3 3  - 3  Change in appetite 2 1 0 - 1  Feeling bad or failure about yourself  1 1 0 - 0  Trouble concentrating 0 1 2 - 0  Moving slowly or fidgety/restless 0 0 0 - 0  Suicidal thoughts 0 0 0 - 0  PHQ-9 Score 12 13 12  - 11    GAD 7 : Generalized Anxiety Score 04/02/2016 02/18/2016 11/22/2015 09/12/2015  Nervous, Anxious, on Edge 2 2 2  0  Control/stop worrying 2 2 2  0  Worry too much - different things 2 2 2  0  Trouble relaxing 2 2 2  0  Restless 2 2 2  0  Easily annoyed or irritable 1 2 2  0  Afraid - awful might happen 0 1 0 0  Total GAD 7 Score 11 13 12  0  Anxiety Difficulty - - - -     Assessment & Plan:  Kayann was seen today for  hypertension.  Diagnoses and all orders for this visit:  Esophageal dysphagia -     Cancel: H. pylori breath test -     DG Esophagus; Future  Hypokalemia -     BASIC METABOLIC PANEL WITH GFR  Smoking -     buPROPion (WELLBUTRIN SR) 150 MG 12 hr tablet; Take 1 tablet (150 mg total) by mouth 2 (two) times daily.  Anxiety and depression -     buPROPion (WELLBUTRIN SR) 150 MG 12 hr tablet; Take 1 tablet (150 mg total) by mouth 2 (two) times daily.  Osteoarthritis of cervical spine with myelopathy -     Ambulatory referral to Neurosurgery -     Discontinue: meloxicam (MOBIC) 7.5 MG tablet; Take  1 tablet (7.5 mg total) by mouth daily.  Other orders -     naproxen (NAPROSYN) 500 MG tablet; Take 1 tablet (500 mg total) by mouth 2 (two) times daily with a meal.   There are no diagnoses linked to this encounter.  There are no diagnoses linked to this encounter. No orders of the defined types were placed in this encounter.   Follow-up: Return in about 3 weeks (around 05/08/2016).   Boykin Nearing MD

## 2016-04-17 NOTE — Assessment & Plan Note (Signed)
Well controlled Checking BMP with GFR to f/u low potassium levels

## 2016-04-17 NOTE — Assessment & Plan Note (Addendum)
Dysphagia in smoker, former ETOH drinker No red flags  Plan: Continue zantac Barium swallow  Barium swallow revealed small hiatal and moderate reflux This Would cause pain and difficulty swallowing Plan: GI referral Continue zantac

## 2016-04-17 NOTE — Patient Instructions (Addendum)
Brandy Stevenson was seen today for hypertension.  Diagnoses and all orders for this visit:  Esophageal dysphagia -     Cancel: H. pylori breath test -     DG Esophagus; Future  Hypokalemia -     BASIC METABOLIC PANEL WITH GFR  Smoking -     buPROPion (WELLBUTRIN SR) 150 MG 12 hr tablet; Take 1 tablet (150 mg total) by mouth 2 (two) times daily.  Anxiety and depression -     buPROPion (WELLBUTRIN SR) 150 MG 12 hr tablet; Take 1 tablet (150 mg total) by mouth 2 (two) times daily.  Osteoarthritis of cervical spine with myelopathy -     Ambulatory referral to Neurosurgery -     Discontinue: meloxicam (MOBIC) 7.5 MG tablet; Take 1 tablet (7.5 mg total) by mouth daily.  Other orders -     naproxen (NAPROSYN) 500 MG tablet; Take 1 tablet (500 mg total) by mouth 2 (two) times daily with a meal.   We will evaluate your swallowing Be sure to take take zantac  Checking potassium level today you will be called if potassium is needed to be continued Restart wellbutrin 150 mg daily for 3 days, then twice daily, be sure to take second dose before 6 PM  F/u in 3 weeks for smoking cessation, f/u restart of Wellbutrin and for swallowing difficulty   Dr. Adrian Blackwater

## 2016-04-17 NOTE — Assessment & Plan Note (Signed)
Neurosurgery placed

## 2016-04-18 ENCOUNTER — Telehealth: Payer: Self-pay

## 2016-04-18 NOTE — Telephone Encounter (Signed)
Pt was called and informed of lab results and to continue taking medication. 

## 2016-04-21 ENCOUNTER — Ambulatory Visit (HOSPITAL_COMMUNITY): Payer: Medicare HMO

## 2016-04-22 ENCOUNTER — Ambulatory Visit (HOSPITAL_COMMUNITY)
Admission: RE | Admit: 2016-04-22 | Discharge: 2016-04-22 | Disposition: A | Discharge status: Home / Self Care | Payer: Medicare HMO | Source: Ambulatory Visit | Attending: Family Medicine | Admitting: Family Medicine

## 2016-04-22 DIAGNOSIS — K219 Gastro-esophageal reflux disease without esophagitis: Secondary | ICD-10-CM | POA: Insufficient documentation

## 2016-04-22 DIAGNOSIS — K449 Diaphragmatic hernia without obstruction or gangrene: Secondary | ICD-10-CM | POA: Diagnosis not present

## 2016-04-22 DIAGNOSIS — R1319 Other dysphagia: Secondary | ICD-10-CM

## 2016-04-22 DIAGNOSIS — R1310 Dysphagia, unspecified: Secondary | ICD-10-CM | POA: Insufficient documentation

## 2016-04-30 ENCOUNTER — Other Ambulatory Visit: Payer: Self-pay | Admitting: Family Medicine

## 2016-04-30 DIAGNOSIS — G4700 Insomnia, unspecified: Secondary | ICD-10-CM

## 2016-05-01 ENCOUNTER — Ambulatory Visit
Admission: RE | Admit: 2016-05-01 | Discharge: 2016-05-01 | Disposition: A | Discharge status: Home / Self Care | Payer: Medicare HMO | Source: Ambulatory Visit | Attending: Family Medicine | Admitting: Family Medicine

## 2016-05-01 ENCOUNTER — Telehealth: Payer: Self-pay

## 2016-05-01 DIAGNOSIS — Z1231 Encounter for screening mammogram for malignant neoplasm of breast: Secondary | ICD-10-CM | POA: Diagnosis not present

## 2016-05-01 DIAGNOSIS — K449 Diaphragmatic hernia without obstruction or gangrene: Secondary | ICD-10-CM | POA: Insufficient documentation

## 2016-05-01 NOTE — Assessment & Plan Note (Signed)
Barium swallow revealed small hiatal and moderate reflux This Would cause pain and difficulty swallowing Plan: GI referral Continue zantac

## 2016-05-01 NOTE — Telephone Encounter (Signed)
Pt returned phone call and was informed of lab results, and referral being placed for her.

## 2016-05-01 NOTE — Addendum Note (Signed)
Addended by: Boykin Nearing on: 05/01/2016 10:58 AM   Modules accepted: Orders

## 2016-05-01 NOTE — Telephone Encounter (Signed)
Patient refill request -

## 2016-05-01 NOTE — Telephone Encounter (Signed)
Pt was called and a VM was left informing pt to return phone call for lab results. 

## 2016-05-08 ENCOUNTER — Ambulatory Visit: Payer: Medicare HMO | Admitting: Family Medicine

## 2016-05-08 MED ORDER — DIPHENHYDRAMINE HCL 50 MG PO CAPS: 50.0000 mg | ORAL_CAPSULE | Freq: Every evening | ORAL | 2 refills | Status: DC | PRN

## 2016-05-15 ENCOUNTER — Encounter: Payer: Self-pay | Admitting: Family

## 2016-05-23 MED FILL — LISINOPRIL-HCTZ 20-12.5 MG: 20-12.5 | 30 days supply | Qty: 60 | Fill #3

## 2016-05-28 ENCOUNTER — Encounter: Payer: Self-pay | Admitting: Family

## 2016-05-28 ENCOUNTER — Ambulatory Visit (HOSPITAL_COMMUNITY)
Admission: RE | Admit: 2016-05-28 | Discharge: 2016-05-28 | Disposition: A | Discharge status: Home / Self Care | Payer: Medicare HMO | Source: Ambulatory Visit | Attending: Family | Admitting: Family

## 2016-05-28 ENCOUNTER — Ambulatory Visit (INDEPENDENT_AMBULATORY_CARE_PROVIDER_SITE_OTHER): Payer: Medicare HMO | Admitting: Family

## 2016-05-28 VITALS — BP 111/79 | HR 63 | Temp 97.0°F | Resp 20 | Ht 65.0 in | Wt 144.0 lb

## 2016-05-28 DIAGNOSIS — I7409 Other arterial embolism and thrombosis of abdominal aorta: Secondary | ICD-10-CM | POA: Diagnosis not present

## 2016-05-28 DIAGNOSIS — F17200 Nicotine dependence, unspecified, uncomplicated: Secondary | ICD-10-CM

## 2016-05-28 NOTE — Progress Notes (Signed)
[facility] [place] [facility]   CC: Follow up peripheral artery occlusive disease  History of Present Illness Brandy Stevenson is a 61 y.o. female patient of Dr. Kellie Simmering who returns for follow up of bilateral lower extremity PAD and known iliac artery stenosis.  She has no history of nonhealing ulcers or infection.  Dr. Scot Dock performed an arteriogram on 10/22/2015. This showed that her infrarenal aorta was occluded below the inferior mesenteric artery. There was reconstitution of the external iliac arteries bilaterally. She had no significant disease of the aortic arch or right or left subclavian arteries.  Dr. Scot Dock saw pt on 11-22-15. At that time she had stable claudication. She was very close to getting off cigarettes completely and Dr. Scot Dock encouraged her to try to get off cigarettes completely, and discussed a structured walking program and the importance of nutrition. Hopefully the [facility] vascular rehabilitation program will be up and running soon, Dr. Scot Dock added her name to the list; Dr. Scot Dock indicates that she would be an excellent candidate for this. She seems very motivated. If her symptoms progressed then Dr. Scot Dock indicates she would be a candidate for aortofemoral bypass grafting. She has had previous abdominal surgery. Dr. Scot Dock advised pt to return in 6 months with ABIs.   The tired feeling in her legs starts in her thighs, travels distally to her calves, also described as heavy feeling in feet. Her lower legs feel cold all of the time. She denies non healing wounds but does have dry skin cracking at backs of both heels, no fissures. Her feet "ball up" at night.   She has a history of left breast lumpectomy for breast cancer.  She denies any history of stroke or TIA, denies hx of MI or heart disease. She states she has back pain with bending over and the same tired feeling in legs with bending at the waist. Her legs give way at times.  She was  under evaluation of her back which was halted when left breast cancer found in 2009, but was evaluated by Dr. Vertell Limber for this in May 2017. Pt states he told her that she has arthritis in her neck and minor lumbar spine issues, not enough to cause the bilateral calf, thigh, and hips pain, at it's worst when she makes her bed, also present when she lies supine. She states many years ago she was told that she had a deteriorating disc in her back. She was also diagnosed a few years back with osteoporosis.    Her PCP is [facility].  Gabapentin has helped pain in her legs.  Pt Diabetic: A1C was 5.9 in January 2017 (review of records) (pre-diabetes). She has a sign of insulin resistance: acanthosis nigricans encircling her neck Pt smoker: Smoker, about 2 cigarettes/day, (40+ yrs). She has been taking Wellbutrin since about June 2017 and has decreased her smoking. She expresses a strong desire to stop smoking.   Pt meds include: Statin :yes ASA: Yes Other anticoagulants/antiplatelets: no   Past Medical History:  Diagnosis Date  . Arthritis    back problems to be evaluated by neurology next month  . Cancer ([place]) 2009   L, Breast, radiation and surgery.  [name] COPD (chronic obstructive pulmonary disease) ([place])   . Depression   . GERD (gastroesophageal reflux disease)   . Hyperlipemia   . Hypertension   . Peripheral vascular disease ([place]) 2013    Social History Social History  Substance Use Topics  . Smoking status: Light Tobacco  Smoker    Packs/day: 0.25    Years: 40.00    Types: Cigarettes  . Smokeless tobacco: Never Used     Comment: 3 cigarettes per day  . Alcohol use No    Family History Family History  Problem Relation Age of Onset  . Hyperlipidemia Mother   . Hypertension Mother   . Heart disease Mother     Atrial Fib.  . Hyperlipidemia Sister   . Hypertension Sister   . Heart disease Sister     Before age 10,  CHF  . Heart attack Brother   .  Hyperlipidemia Brother   . Hypertension Brother   . Hypertension Daughter     Past Surgical History:  Procedure Laterality Date  . APPENDECTOMY  1980  . BREAST LUMPECTOMY Left    s/p radiation therapy   . BREAST SURGERY    . COLONOSCOPY  07/2015  . COLONOSCOPY WITH PROPOFOL N/A 06/26/2015   Procedure: COLONOSCOPY WITH PROPOFOL;  Surgeon: [name], MD;  Location: [facility];  Service: Endoscopy;  Laterality: N/A;  . PERIPHERAL VASCULAR CATHETERIZATION N/A 10/22/2015   Procedure: Abdominal Aortogram w/Lower Extremity;  Surgeon: [name], MD;  Location: [facility];  Service: Cardiovascular;  Laterality: N/A;  . TUBAL LIGATION  1991    Allergies  Allergen Reactions  . Vicodin [Hydrocodone-Acetaminophen] Nausea And Vomiting and Other (See Comments)    Sweating    Current Outpatient Prescriptions  Medication Sig Dispense Refill  . albuterol (PROVENTIL HFA;VENTOLIN HFA) 108 (90 Base) MCG/ACT inhaler Inhale 2 puffs into the lungs every 6 (six) hours as needed for wheezing. 3 Inhaler 3  . aspirin EC 81 MG tablet Take 1 tablet (81 mg total) by mouth daily. 90 tablet 3  . atorvastatin (LIPITOR) 20 MG tablet Take 1 tablet (20 mg total) by mouth daily. 90 tablet 3  . buPROPion (WELLBUTRIN SR) 150 MG 12 hr tablet Take 1 tablet (150 mg total) by mouth 2 (two) times daily. 60 tablet 2  . diphenhydrAMINE (BENADRYL) 50 MG capsule Take 1 capsule (50 mg total) by mouth at bedtime as needed. 30 capsule 2  . Fluticasone-Salmeterol (ADVAIR) 100-50 MCG/DOSE AEPB Inhale 1 puff into the lungs 2 (two) times daily. 180 each 3  . gabapentin (NEURONTIN) 300 MG capsule Take 1-2 capsules (300-600 mg total) by mouth See admin instructions. 300 mg in the morning and 600 mg in the evening (Patient taking differently: Take 300 mg by mouth at bedtime. ) 270 capsule 3  . lisinopril-hydrochlorothiazide (ZESTORETIC) 20-12.5 MG tablet Take 1 tablet by mouth daily. 30 tablet 1  . naproxen  (NAPROSYN) 500 MG tablet Take 1 tablet (500 mg total) by mouth 2 (two) times daily with a meal. 60 tablet 5  . potassium chloride SA (K-DUR,KLOR-CON) 20 MEQ tablet Take 1 tablet (20 mEq total) by mouth daily. 30 tablet 5  . ranitidine (ZANTAC) 150 MG tablet Take 1 tablet (150 mg total) by mouth 2 (two) times daily. 180 tablet 3   No current facility-administered medications for this visit.     ROS: See HPI for pertinent positives and negatives.   Physical Examination  Vitals:   05/28/16 1449  BP: 111/79  Pulse: 63  Resp: 20  Temp: 97 F (36.1 C)  TempSrc: Oral  SpO2: 97%  Weight: 144 lb (65.3 kg)  Height: 5\' 5"  (1.651 m)   Body mass index is 23.96 kg/m.  General: A&O x 3, WDWN. Neck: mild thyromegaly, right lobe larger than  left, mild acanthosis nigricans.  Gait: slow, deliberate Eyes: PERRLA. Pulmonary: Respirations are non labored, CTAB, without wheezes, rales, or rhonchi. Cardiac: regular rhythm, no detected murmur.     Carotid Bruits Right Left   Negative Negative  Aorta is not palpable. Radial pulses: 2+ palpable and =   VASCULAR EXAM: Extremitieswithout ischemic changes  without Gangrene; without open wounds. Dry cracking skin at posterior aspect of heels, no fissures. Plantar warts.      LE Pulses Right Left   FEMORAL Not palpable Not palpable    POPLITEAL not palpable  not palpable   POSTERIOR TIBIAL not palpable  not palpable    DORSALIS PEDIS  ANTERIOR TIBIAL not palpable  not palpable    Abdomen: soft, NT, no palpable masses. Skin: no rashes, no ulcers. See extremities.  Musculoskeletal: no muscle wasting or atrophy. Neurologic: A&O X 3; Appropriate Affect ; SENSATION: normal; MOTOR FUNCTION:  moving all extremities equally, motor strength 5/5 throughout in UE's, 3/5 in LE's. Speech is fluent/normal. CN 2-12 grossly intact. Positive straight leg raise test in both legs.      ASSESSMENT: Brandy Stevenson is a 61 y.o. female who presents with PAD, known aortoiliac artery disease,has no signs of ischemia in her feet/legs.  Dr. Scot Dock performed an arteriogram on 10/22/2015. This showed that her infrarenal aorta was occluded below the inferior mesenteric artery. There was reconstitution of the external iliac arteries bilaterally. She had no significant disease of the aortic arch or right or left subclavian arteries.   She was evaluated by Dr. Vertell Limber and he determined that the lumber spine issues she has are minor, not severe enough to cause the symptoms in her legs.  She is not walking any more than usual as her hips to calves are painful with walking. She has painful plantar warts that she has been self treating, states she cannot afford the co-pay to see a podiatrist.   She denies a known diagnosis of DM, she does have a sign of insulin resistance: acanthosis nigricans. Unfortunately she resumed smoking.   Pt reports generalized feeling of feeling cold, straining at stool, fatigue, and unintentional weight loss in the last year. She denies post prandial abdominal pain. She denies palpitations. Pt also has mild thyromegaly with right lobe of thyroid larger than left. No TSH result on file dating back to 2016; defer to pt PCP to check her thyroid function with TSH, free T4 and free T3, thyroid antibodies.    DATA:  ABI's  (05/28/16): Right: 0.60 (0.71, 10-22-15), waveforms: monophasic; TBI: 0.47 LEFT: 0.57 (0.66, 10-22-15), waveforms: monophasic; TBI: 0.42 Slight decline in bilateral ABI, moderate arterial occlusive disease bilaterally.   10/28/13 CT abd/pelvis w/o contrast requested by Dr. Annitta Needs for hematuria, right flank pain: Densely calcified atherosclerotic plaque extends along  the abdominal aorta into the iliac arteries. This is advanced for age.  07/16/15 MR Lumbar spine w/o contrast requested by Dr. Vertell Limber: IMPRESSION: Mild lumbar degenerative change. Normal alignment Small right foraminal disc protrusion L3-4 with associated disc bulging.    PLAN:  The patient was counseled re smoking cessation and given several free resources re smoking cessation.   Based on the patient's vascular studies and examination, pt will return to clinic in 4 months with ABI's.  Continue graduated walking program.  I discussed in depth with the patient the nature of atherosclerosis, and emphasized the importance of maximal medical management including strict control of blood pressure, blood glucose, and lipid levels, obtaining regular exercise, and cessation of smoking.  The patient is aware that without maximal medical management the underlying atherosclerotic disease process will progress, limiting the benefit of any interventions.  The patient was given information about PAD including signs, symptoms, treatment, what symptoms should prompt the patient to seek immediate medical care, and risk reduction measures to take.  Clemon Chambers, RN, MSN, FNP-C [facility] Phone: [phone]  Clinic MD: Scot Dock  05/28/16 2:59 PM

## 2016-05-28 NOTE — Patient Instructions (Signed)
Peripheral Vascular Disease Peripheral vascular disease (PVD) is a disease of the blood vessels that are not part of your heart and brain. A simple term for PVD is poor circulation. In most cases, PVD narrows the blood vessels that carry blood from your heart to the rest of your body. This can result in a decreased supply of blood to your arms, legs, and internal organs, like your stomach or kidneys. However, it most often affects a person's lower legs and feet. There are two types of PVD.  Organic PVD. This is the more common type. It is caused by damage to the structure of blood vessels.  Functional PVD. This is caused by conditions that make blood vessels contract and tighten (spasm). Without treatment, PVD tends to get worse over time. PVD can also lead to acute ischemic limb. This is when an arm or limb suddenly has trouble getting enough blood. This is a medical emergency. Follow these instructions at home:  Take medicines only as told by your doctor.  Do not use any tobacco products, including cigarettes, chewing tobacco, or electronic cigarettes. If you need help quitting, ask your doctor.  Lose weight if you are overweight, and maintain a healthy weight as told by your doctor.  Eat a diet that is low in fat and cholesterol. If you need help, ask your doctor.  Exercise regularly. Ask your doctor for some good activities for you.  Take good care of your feet.  Wear comfortable shoes that fit well.  Check your feet often for any cuts or sores. Contact a doctor if:  You have cramps in your legs while walking.  You have leg pain when you are at rest.  You have coldness in a leg or foot.  Your skin changes.  You are unable to get or have an erection (erectile dysfunction).  You have cuts or sores on your feet that are not healing. Get help right away if:  Your arm or leg turns cold and blue.  Your arms or legs become red, warm, swollen, painful, or numb.  You have  chest pain or trouble breathing.  You suddenly have weakness in your face, arm, or leg.  You become very confused or you cannot speak.  You suddenly have a very bad headache.  You suddenly cannot see. This information is not intended to replace advice given to you by your health care provider. Make sure you discuss any questions you have with your health care provider. Document Released: 05/14/2009 Document Revised: 07/26/2015 Document Reviewed: 07/28/2013 Elsevier Interactive Patient Education  2017 Elsevier Inc.      Steps to Quit Smoking Smoking tobacco can be bad for your health. It can also affect almost every organ in your body. Smoking puts you and people around you at risk for many serious long-lasting (chronic) diseases. Quitting smoking is hard, but it is one of the best things that you can do for your health. It is never too late to quit. What are the benefits of quitting smoking? When you quit smoking, you lower your risk for getting serious diseases and conditions. They can include:  Lung cancer or lung disease.  Heart disease.  Stroke.  Heart attack.  Not being able to have children (infertility).  Weak bones (osteoporosis) and broken bones (fractures). If you have coughing, wheezing, and shortness of breath, those symptoms may get better when you quit. You may also get sick less often. If you are pregnant, quitting smoking can help to lower your chances   of having a baby of low birth weight. What can I do to help me quit smoking? Talk with your doctor about what can help you quit smoking. Some things you can do (strategies) include:  Quitting smoking totally, instead of slowly cutting back how much you smoke over a period of time.  Going to in-person counseling. You are more likely to quit if you go to many counseling sessions.  Using resources and support systems, such as:  Online chats with a counselor.  Phone quitlines.  Printed self-help  materials.  Support groups or group counseling.  Text messaging programs.  Mobile phone apps or applications.  Taking medicines. Some of these medicines may have nicotine in them. If you are pregnant or breastfeeding, do not take any medicines to quit smoking unless your doctor says it is okay. Talk with your doctor about counseling or other things that can help you. Talk with your doctor about using more than one strategy at the same time, such as taking medicines while you are also going to in-person counseling. This can help make quitting easier. What things can I do to make it easier to quit? Quitting smoking might feel very hard at first, but there is a lot that you can do to make it easier. Take these steps:  Talk to your family and friends. Ask them to support and encourage you.  Call phone quitlines, reach out to support groups, or work with a counselor.  Ask people who smoke to not smoke around you.  Avoid places that make you want (trigger) to smoke, such as:  Bars.  Parties.  Smoke-break areas at work.  Spend time with people who do not smoke.  Lower the stress in your life. Stress can make you want to smoke. Try these things to help your stress:  Getting regular exercise.  Deep-breathing exercises.  Yoga.  Meditating.  Doing a body scan. To do this, close your eyes, focus on one area of your body at a time from head to toe, and notice which parts of your body are tense. Try to relax the muscles in those areas.  Download or buy apps on your mobile phone or tablet that can help you stick to your quit plan. There are many free apps, such as QuitGuide from the CDC (Centers for Disease Control and Prevention). You can find more support from smokefree.gov and other websites. This information is not intended to replace advice given to you by your health care provider. Make sure you discuss any questions you have with your health care provider. Document Released:  12/14/2008 Document Revised: 10/16/2015 Document Reviewed: 07/04/2014 Elsevier Interactive Patient Education  2017 Elsevier Inc.  

## 2016-05-29 NOTE — Addendum Note (Signed)
Addended by: Lianne Cure A on: 05/29/2016 09:25 AM   Modules accepted: Orders

## 2016-06-17 ENCOUNTER — Other Ambulatory Visit: Payer: Self-pay | Admitting: Family Medicine

## 2016-06-17 DIAGNOSIS — IMO0001 Reserved for inherently not codable concepts without codable children: Secondary | ICD-10-CM

## 2016-06-17 DIAGNOSIS — F17200 Nicotine dependence, unspecified, uncomplicated: Secondary | ICD-10-CM

## 2016-06-17 DIAGNOSIS — F329 Major depressive disorder, single episode, unspecified: Secondary | ICD-10-CM

## 2016-06-17 DIAGNOSIS — F419 Anxiety disorder, unspecified: Secondary | ICD-10-CM

## 2016-06-17 DIAGNOSIS — F32A Depression, unspecified: Secondary | ICD-10-CM

## 2016-06-27 ENCOUNTER — Encounter: Payer: Self-pay | Admitting: Family Medicine

## 2016-06-27 ENCOUNTER — Ambulatory Visit: Payer: Medicare HMO | Attending: Family Medicine | Admitting: Family Medicine

## 2016-06-27 VITALS — BP 129/78 | HR 60 | Temp 98.1°F | Ht 65.0 in | Wt 144.0 lb

## 2016-06-27 DIAGNOSIS — F17210 Nicotine dependence, cigarettes, uncomplicated: Secondary | ICD-10-CM | POA: Diagnosis not present

## 2016-06-27 DIAGNOSIS — H538 Other visual disturbances: Secondary | ICD-10-CM | POA: Insufficient documentation

## 2016-06-27 DIAGNOSIS — L84 Corns and callosities: Secondary | ICD-10-CM | POA: Insufficient documentation

## 2016-06-27 DIAGNOSIS — Z79899 Other long term (current) drug therapy: Secondary | ICD-10-CM | POA: Diagnosis not present

## 2016-06-27 DIAGNOSIS — R51 Headache: Secondary | ICD-10-CM | POA: Diagnosis present

## 2016-06-27 DIAGNOSIS — G500 Trigeminal neuralgia: Secondary | ICD-10-CM | POA: Insufficient documentation

## 2016-06-27 DIAGNOSIS — R234 Changes in skin texture: Secondary | ICD-10-CM | POA: Insufficient documentation

## 2016-06-27 DIAGNOSIS — Z853 Personal history of malignant neoplasm of breast: Secondary | ICD-10-CM | POA: Diagnosis not present

## 2016-06-27 DIAGNOSIS — I70219 Atherosclerosis of native arteries of extremities with intermittent claudication, unspecified extremity: Secondary | ICD-10-CM

## 2016-06-27 DIAGNOSIS — I10 Essential (primary) hypertension: Secondary | ICD-10-CM | POA: Diagnosis not present

## 2016-06-27 DIAGNOSIS — E010 Iodine-deficiency related diffuse (endemic) goiter: Secondary | ICD-10-CM | POA: Diagnosis not present

## 2016-06-27 DIAGNOSIS — Z7982 Long term (current) use of aspirin: Secondary | ICD-10-CM | POA: Diagnosis not present

## 2016-06-27 MED ORDER — GABAPENTIN 300 MG PO CAPS: ORAL_CAPSULE | ORAL | 3 refills | Status: DC

## 2016-06-27 NOTE — Patient Instructions (Addendum)
Brandy Stevenson was seen today for headache.  Diagnoses and all orders for this visit:  Thyromegaly -     TSH -     T3, Free -     T4, free  Atherosclerotic PVD with intermittent claudication (HCC) -     gabapentin (NEURONTIN) 300 MG capsule; Take by mouth 300 mg in the day as needed for pain and 300 mg at night -     Ambulatory referral to Podiatry  Blurry vision, right eye -     Ambulatory referral to Ophthalmology  Cracked skin on feet -     Ambulatory referral to Podiatry   You will be called with lab results Keep track of headache symptoms   f/u in 2 weeks for R sided headache  Dr. Adrian Blackwater

## 2016-06-27 NOTE — Assessment & Plan Note (Signed)
Chronic in R eye Referral to opthalmology placed

## 2016-06-27 NOTE — Progress Notes (Signed)
Subjective:  Patient ID: Brandy Stevenson, female    DOB: [birth year]  Age: 61 y.o. MRN: [id number]  CC: Headache   HPI Brandy Stevenson has PAD, HTN, smoker, hx of breast cancer she presents for   1. Head pains: started 2 days ago. On the R side. Comes and goes. Quick, sharp pains. Pains occur at location of her ear and radiate forwards to temple. Last episode last night. She has chronic blurriness in her R eye. She denies ringing in her R ear or hearing loss.   2. ? Thyroid dysfunction: reports feeling hot and cold. Night sweats. Low energy. Feeling stressed and depressed. She had f/u with vascular surgery for PAD and was told that the R lobe of her thyroid gland was enlarged. She reports some soreness on the R side of her neck.   3. Foot pain: has dry, flaky skin on her feet and lower legs. She has cracking of her heels with pain. She has calluses on her R foot that are tender. She denies foot itching. She soaks her feet twice weekly in a special soap that is gives relief. She has pain with walking and prolonged standing.   Social History  Substance Use Topics  . Smoking status: Light Tobacco Smoker    Packs/day: 0.25    Years: 40.00    Types: Cigarettes  . Smokeless tobacco: Never Used     Comment: 3 cigarettes per day  . Alcohol use No    Outpatient Medications Prior to Visit  Medication Sig Dispense Refill  . albuterol (PROVENTIL HFA;VENTOLIN HFA) 108 (90 Base) MCG/ACT inhaler Inhale 2 puffs into the lungs every 6 (six) hours as needed for wheezing. 3 Inhaler 3  . aspirin EC 81 MG tablet Take 1 tablet (81 mg total) by mouth daily. 90 tablet 3  . atorvastatin (LIPITOR) 20 MG tablet Take 1 tablet (20 mg total) by mouth daily. 90 tablet 3  . buPROPion (WELLBUTRIN SR) 150 MG 12 hr tablet TAKE 1 TABLET TWICE DAILY 180 tablet 0  . diphenhydrAMINE (BENADRYL) 50 MG capsule Take 1 capsule (50 mg total) by mouth at bedtime as needed. 30 capsule 2  . Fluticasone-Salmeterol (ADVAIR) 100-50  MCG/DOSE AEPB Inhale 1 puff into the lungs 2 (two) times daily. 180 each 3  . gabapentin (NEURONTIN) 300 MG capsule Take 1-2 capsules (300-600 mg total) by mouth See admin instructions. 300 mg in the morning and 600 mg in the evening (Patient taking differently: Take 300 mg by mouth at bedtime. ) 270 capsule 3  . lisinopril-hydrochlorothiazide (ZESTORETIC) 20-12.5 MG tablet Take 1 tablet by mouth daily. 30 tablet 1  . naproxen (NAPROSYN) 500 MG tablet Take 1 tablet (500 mg total) by mouth 2 (two) times daily with a meal. 60 tablet 5  . potassium chloride SA (K-DUR,KLOR-CON) 20 MEQ tablet Take 1 tablet (20 mEq total) by mouth daily. 30 tablet 5  . ranitidine (ZANTAC) 150 MG tablet Take 1 tablet (150 mg total) by mouth 2 (two) times daily. 180 tablet 3   No facility-administered medications prior to visit.     ROS Review of Systems  Constitutional: Negative for chills and fever.  HENT: Negative for trouble swallowing.   Eyes: Negative for visual disturbance.  Respiratory: Negative for shortness of breath.   Cardiovascular: Negative for chest pain.  Gastrointestinal: Negative for abdominal pain and blood in stool.  Genitourinary: Negative for frequency.  Musculoskeletal: Positive for arthralgias, myalgias and neck pain. Negative for back pain.  Skin: Negative for  rash.  Allergic/Immunologic: Negative for immunocompromised state.  Neurological: Positive for headaches.  Hematological: Negative for adenopathy. Does not bruise/bleed easily.  Psychiatric/Behavioral: Positive for dysphoric mood and sleep disturbance. Negative for suicidal ideas.    Objective:  BP 129/78   Pulse 60   Temp 98.1 F (36.7 C) (Oral)   Ht 5\' 5"  (1.651 m)   Wt 144 lb (65.3 kg)   SpO2 100%   BMI 23.96 kg/m   BP/Weight 06/27/2016 05/28/2016 0/93/2671  Systolic BP 245 809 983  Diastolic BP 78 79 70  Wt. (Lbs) 144 144 144.6  BMI 23.96 23.96 24.06    Physical Exam  Constitutional: She is oriented to person,  place, and time. She appears well-developed and well-nourished. No distress.  HENT:  Head: Normocephalic and atraumatic.  Right Ear: Tympanic membrane, external ear and ear canal normal.  Left Ear: Tympanic membrane, external ear and ear canal normal.  Eyes: Conjunctivae and EOM are normal. Pupils are equal, round, and reactive to light.  Neck: Normal range of motion. Thyromegaly (slight enlargement in R lobe of thyroid gland, no nodules ) present.  Cardiovascular: Normal rate, regular rhythm, normal heart sounds and intact distal pulses.  Exam reveals decreased pulses.   Pulmonary/Chest: Effort normal and breath sounds normal.  Musculoskeletal: She exhibits no edema.       Feet:  Neurological: She is alert and oriented to person, place, and time.  Skin: Skin is warm and dry. No rash noted.  Psychiatric: She has a normal mood and affect.     Assessment & Plan:  Brandy Stevenson was seen today for headache.  Diagnoses and all orders for this visit:  Thyromegaly -     TSH -     T3, Free -     T4, free  Atherosclerotic PVD with intermittent claudication (HCC) -     gabapentin (NEURONTIN) 300 MG capsule; Take by mouth 300 mg in the day as needed for pain and 300 mg at night -     Ambulatory referral to Podiatry  Blurry vision, right eye -     Ambulatory referral to Ophthalmology  Cracked skin on feet -     Ambulatory referral to Podiatry  Trigeminal neuralgia of right side of face  Foot callus -     Ambulatory referral to Podiatry   There are no diagnoses linked to this encounter.  No orders of the defined types were placed in this encounter.   Follow-up: Return in about 2 weeks (around 07/11/2016) for R sided headaches.   Boykin Nearing MD

## 2016-06-27 NOTE — Assessment & Plan Note (Signed)
Cracking and fissuring of feet in setting of PAD Podiatry referral placed

## 2016-06-27 NOTE — Assessment & Plan Note (Signed)
New onset headache with features consistent with trigeminal neuralgia Discussed options Carbamazepine vs increase gabapentin Since symptoms started only 2 days ago and none today, plan to increase gabapentin to 300 mg q hs and 300 mg in afternoon as needed

## 2016-06-27 NOTE — Assessment & Plan Note (Signed)
Very slight enlargement of R lobe of thyroid gland with no nodules Plan TFTs Thyroid US of TFTs abnormal

## 2016-06-28 LAB — T3, FREE: T3, Free: 3.5 pg/mL (ref 2.0–4.4)

## 2016-06-28 LAB — TSH: TSH: 1.4 u[IU]/mL (ref 0.450–4.500)

## 2016-06-28 LAB — T4, FREE: Free T4: 1.33 ng/dL (ref 0.82–1.77)

## 2016-06-30 ENCOUNTER — Encounter: Payer: Self-pay | Admitting: Family Medicine

## 2016-07-02 ENCOUNTER — Telehealth: Payer: Self-pay

## 2016-07-02 NOTE — Telephone Encounter (Signed)
Pt was called and informed of lab results. 

## 2016-07-10 ENCOUNTER — Encounter: Payer: Self-pay | Admitting: Family Medicine

## 2016-07-10 ENCOUNTER — Ambulatory Visit: Payer: Medicare HMO | Attending: Family Medicine | Admitting: Family Medicine

## 2016-07-10 VITALS — BP 110/72 | HR 59 | Temp 97.9°F | Ht 65.0 in | Wt 147.4 lb

## 2016-07-10 DIAGNOSIS — Z853 Personal history of malignant neoplasm of breast: Secondary | ICD-10-CM | POA: Insufficient documentation

## 2016-07-10 DIAGNOSIS — Z7982 Long term (current) use of aspirin: Secondary | ICD-10-CM | POA: Diagnosis not present

## 2016-07-10 DIAGNOSIS — G500 Trigeminal neuralgia: Secondary | ICD-10-CM | POA: Diagnosis not present

## 2016-07-10 DIAGNOSIS — F17210 Nicotine dependence, cigarettes, uncomplicated: Secondary | ICD-10-CM | POA: Diagnosis not present

## 2016-07-10 DIAGNOSIS — Z79899 Other long term (current) drug therapy: Secondary | ICD-10-CM | POA: Insufficient documentation

## 2016-07-10 DIAGNOSIS — I10 Essential (primary) hypertension: Secondary | ICD-10-CM | POA: Diagnosis not present

## 2016-07-10 DIAGNOSIS — R51 Headache: Secondary | ICD-10-CM | POA: Diagnosis present

## 2016-07-10 DIAGNOSIS — K219 Gastro-esophageal reflux disease without esophagitis: Secondary | ICD-10-CM | POA: Insufficient documentation

## 2016-07-10 DIAGNOSIS — K449 Diaphragmatic hernia without obstruction or gangrene: Secondary | ICD-10-CM | POA: Diagnosis not present

## 2016-07-10 MED ORDER — OMEPRAZOLE 40 MG PO CPDR: 40.0000 mg | DELAYED_RELEASE_CAPSULE | Freq: Every day | ORAL | 3 refills | Status: DC

## 2016-07-10 NOTE — Progress Notes (Signed)
Subjective:  Patient ID: Brandy Stevenson, female    DOB: 1955-12-26  Age: 61 y.o. MRN: [id number]  CC: Headache   HPI Brandy Stevenson has PAD, HTN, smoker, hx of breast cancer she presents for   1. Head pains: resolved with increase gabapentin dose. Leg pain has also improved. She is also sleeping better.  2. ? Thyroid dysfunction: TFs normal at last visit,  3. GERD: he is taking zantac. Smoking 3-4 cigs per day. Working to quit. Reports GERD symptoms are controlled. Eating a low spice diet to try to control symptoms.  She had a barium swallow study on 04/22/2016: IMPRESSION: 1. Small sliding-type hiatal hernia and moderate inducible GE reflux. 2. Normal esophageal motility.  No esophageal lesions or stricture.    Social History  Substance Use Topics  . Smoking status: Light Tobacco Smoker    Packs/day: 0.25    Years: 40.00    Types: Cigarettes  . Smokeless tobacco: Never Used     Comment: 3 cigarettes per day  . Alcohol use No    Outpatient Medications Prior to Visit  Medication Sig Dispense Refill  . albuterol (PROVENTIL HFA;VENTOLIN HFA) 108 (90 Base) MCG/ACT inhaler Inhale 2 puffs into the lungs every 6 (six) hours as needed for wheezing. 3 Inhaler 3  . aspirin EC 81 MG tablet Take 1 tablet (81 mg total) by mouth daily. 90 tablet 3  . atorvastatin (LIPITOR) 20 MG tablet Take 1 tablet (20 mg total) by mouth daily. 90 tablet 3  . buPROPion (WELLBUTRIN SR) 150 MG 12 hr tablet TAKE 1 TABLET TWICE DAILY 180 tablet 0  . diphenhydrAMINE (BENADRYL) 50 MG capsule Take 1 capsule (50 mg total) by mouth at bedtime as needed. 30 capsule 2  . Fluticasone-Salmeterol (ADVAIR) 100-50 MCG/DOSE AEPB Inhale 1 puff into the lungs 2 (two) times daily. 180 each 3  . gabapentin (NEURONTIN) 300 MG capsule Take by mouth 300 mg in the day as needed for pain and 300 mg at night 180 capsule 3  . lisinopril-hydrochlorothiazide (ZESTORETIC) 20-12.5 MG tablet Take 1 tablet by mouth daily. 30 tablet 1    . naproxen (NAPROSYN) 500 MG tablet Take 1 tablet (500 mg total) by mouth 2 (two) times daily with a meal. 60 tablet 5  . potassium chloride SA (K-DUR,KLOR-CON) 20 MEQ tablet Take 1 tablet (20 mEq total) by mouth daily. 30 tablet 5  . ranitidine (ZANTAC) 150 MG tablet Take 1 tablet (150 mg total) by mouth 2 (two) times daily. 180 tablet 3   No facility-administered medications prior to visit.     ROS Review of Systems  Constitutional: Negative for chills and fever.  HENT: Negative for trouble swallowing.   Eyes: Negative for visual disturbance.  Respiratory: Negative for shortness of breath.   Cardiovascular: Negative for chest pain.  Gastrointestinal: Negative for abdominal pain and blood in stool.  Genitourinary: Negative for frequency.  Musculoskeletal: Positive for arthralgias and myalgias. Negative for back pain and neck pain.  Skin: Negative for rash.  Allergic/Immunologic: Negative for immunocompromised state.  Neurological: Negative for headaches.  Hematological: Negative for adenopathy. Does not bruise/bleed easily.  Psychiatric/Behavioral: Positive for dysphoric mood and sleep disturbance. Negative for suicidal ideas.    Objective:  BP 110/72   Pulse (!) 59   Temp 97.9 F (36.6 C) (Oral)   Ht 5\' 5"  (1.651 m)   Wt 147 lb 6.4 oz (66.9 kg)   SpO2 100%   BMI 24.53 kg/m   BP/Weight 07/10/2016 06/27/2016 05/28/2016  Systolic BP 595 638 756  Diastolic BP 72 78 79  Wt. (Lbs) 147.4 144 144  BMI 24.53 23.96 23.96    Physical Exam  Constitutional: She is oriented to person, place, and time. She appears well-developed and well-nourished. No distress.  HENT:  Head: Normocephalic and atraumatic.  Right Ear: Tympanic membrane, external ear and ear canal normal.  Left Ear: Tympanic membrane, external ear and ear canal normal.  Eyes: Conjunctivae and EOM are normal. Pupils are equal, round, and reactive to light.  Neck: Normal range of motion. No thyromegaly present.   Cardiovascular: Normal rate, regular rhythm, normal heart sounds and intact distal pulses.  Exam reveals decreased pulses.   Pulmonary/Chest: Effort normal and breath sounds normal.  Musculoskeletal: She exhibits no edema.  Neurological: She is alert and oriented to person, place, and time.  Skin: Skin is warm and dry. No rash noted.  Psychiatric: She has a normal mood and affect.     Assessment & Plan:  Brandy Stevenson was seen today for headache.  Diagnoses and all orders for this visit:  Hiatal hernia -     omeprazole (PRILOSEC) 40 MG capsule; Take 1 capsule (40 mg total) by mouth daily.  Gastroesophageal reflux disease, esophagitis presence not specified -     omeprazole (PRILOSEC) 40 MG capsule; Take 1 capsule (40 mg total) by mouth daily.  Trigeminal neuralgia of right side of face   There are no diagnoses linked to this encounter.  No orders of the defined types were placed in this encounter.   Follow-up: Return in about 8 weeks (around 09/04/2016) for GERD .   Boykin Nearing MD

## 2016-07-10 NOTE — Patient Instructions (Addendum)
Roniya was seen today for headache.  Diagnoses and all orders for this visit:  Hiatal hernia -     omeprazole (PRILOSEC) 40 MG capsule; Take 1 capsule (40 mg total) by mouth daily.  Gastroesophageal reflux disease, esophagitis presence not specified -     omeprazole (PRILOSEC) 40 MG capsule; Take 1 capsule (40 mg total) by mouth daily.  Trigeminal neuralgia of right side of face   Take prilosec 40 mg daily for 8 weeks  Try to avoid lactose in diet   f/u in 8 weeks for GERD  Dr. Adrian Blackwater

## 2016-07-14 NOTE — Assessment & Plan Note (Signed)
GERD: Continue zantac Smoking cessation addressed Add Prilosec

## 2016-07-14 NOTE — Assessment & Plan Note (Signed)
Symptoms have resolved with increase in gabapentin dosing

## 2016-07-16 ENCOUNTER — Encounter: Payer: Self-pay | Admitting: Family Medicine

## 2016-07-17 ENCOUNTER — Ambulatory Visit: Payer: Medicare HMO | Admitting: Podiatry

## 2016-07-29 MED FILL — LISINOPRIL-HCTZ 20-12.5 MG: 20-12.5 | 30 days supply | Qty: 60 | Fill #4

## 2016-08-11 ENCOUNTER — Ambulatory Visit: Payer: Medicare HMO | Admitting: Podiatry

## 2016-09-04 ENCOUNTER — Ambulatory Visit: Payer: Medicare HMO | Admitting: Family Medicine

## 2016-09-10 ENCOUNTER — Other Ambulatory Visit: Payer: Self-pay | Admitting: Pharmacist

## 2016-09-10 DIAGNOSIS — E7800 Pure hypercholesterolemia, unspecified: Secondary | ICD-10-CM

## 2016-09-10 DIAGNOSIS — J418 Mixed simple and mucopurulent chronic bronchitis: Secondary | ICD-10-CM

## 2016-09-10 MED ORDER — ATORVASTATIN CALCIUM 20 MG PO TABS: 20.0000 mg | ORAL_TABLET | Freq: Every day | ORAL | 0 refills | Status: DC

## 2016-09-10 MED ORDER — ALBUTEROL SULFATE HFA 108 (90 BASE) MCG/ACT IN AERS: 2.0000 | INHALATION_SPRAY | Freq: Four times a day (QID) | RESPIRATORY_TRACT | 0 refills | Status: DC | PRN

## 2016-09-12 ENCOUNTER — Ambulatory Visit: Payer: Medicare HMO | Admitting: Family Medicine

## 2016-09-22 ENCOUNTER — Encounter: Payer: Self-pay | Admitting: Family

## 2016-09-29 MED FILL — LISINOPRIL-HCTZ 20-12.5 MG: 20-12.5 | 30 days supply | Qty: 60 | Fill #5

## 2016-10-01 ENCOUNTER — Ambulatory Visit (INDEPENDENT_AMBULATORY_CARE_PROVIDER_SITE_OTHER): Payer: Medicare HMO | Admitting: Family

## 2016-10-01 ENCOUNTER — Encounter: Payer: Self-pay | Admitting: Family

## 2016-10-01 ENCOUNTER — Ambulatory Visit (HOSPITAL_COMMUNITY)
Admission: RE | Admit: 2016-10-01 | Discharge: 2016-10-01 | Disposition: A | Discharge status: Home / Self Care | Payer: Medicare HMO | Source: Ambulatory Visit | Attending: Vascular Surgery | Admitting: Vascular Surgery

## 2016-10-01 VITALS — BP 122/80 | HR 60 | Temp 98.7°F | Resp 59 | Ht 65.0 in | Wt 144.9 lb

## 2016-10-01 DIAGNOSIS — I739 Peripheral vascular disease, unspecified: Secondary | ICD-10-CM | POA: Diagnosis not present

## 2016-10-01 DIAGNOSIS — I7409 Other arterial embolism and thrombosis of abdominal aorta: Secondary | ICD-10-CM | POA: Diagnosis not present

## 2016-10-01 DIAGNOSIS — F17200 Nicotine dependence, unspecified, uncomplicated: Secondary | ICD-10-CM

## 2016-10-01 NOTE — Progress Notes (Signed)
[facility] [place]   CC: Follow up peripheral artery occlusive disease  History of Present Illness Brandy Stevenson is a 61 y.o. female who returns for follow up of bilateral lower extremity PAD and known iliac artery stenosis.  She has no history of nonhealing ulcers or infection.  Dr. Scot Dock performed an arteriogram on 10/22/2015. This showed that her infrarenal aorta was occluded below the inferior mesenteric artery. There was reconstitution of the external iliac arteries bilaterally. She had no significant disease of the aortic arch or right or left subclavian arteries.  Dr. Scot Dock saw pt on 11-22-15. At that time she had stable claudication. She was very close to getting off cigarettes completely and Dr. Scot Dock encouraged her to try to get off cigarettes completely, and discussed a structured walking program and the importance of nutrition. Hopefully the [facility] vascular rehabilitation program will be up and running soon, Dr. Scot Dock added her name to the list; Dr. Scot Dock indicates that she would be an excellent candidate for this. She seems very motivated. If her symptoms progressed then Dr. Scot Dock indicates she would be a candidate for aortofemoral bypass grafting. She has had previous abdominal surgery. Dr. Scot Dock advised pt to return in 6 months with ABIs.   After walking about 200 feet both hips feel achy and tired, this feeling extends distally into her calves, relieved by rest.  Her lower legs feel cold all of the time. She denies non healing wounds but does have dry skin cracking at backs of both heels, no fissures. Her feet "ball up" at night.   She has a history of left breast lumpectomy for breast cancer.  She denies any history of stroke or TIA, denies hx of MI or heart disease. She states she has back pain with bending over and the same tired feeling in legs with bending at the waist. Her legs give way at times.  She was under evaluation of her  back which was halted when left breast cancer found in 2009, but was evaluated by Dr. Vertell Limber for this in May 2017. Pt states he told her that she has arthritis in her neck and minor lumbar spine issues, not enough to cause the bilateral calf, thigh, and hips pain, at it's worstwhen she makes her bed, also present when she lies supine. She states many years ago she was told that she had a deteriorating disc in her back. She was also diagnosed a few years back with osteoporosis.    Her PCP is [place].  Gabapentin has helped pain in her legs.  Pt Diabetic: A1C was 5.9 in January 2017 (review of records) (pre-diabetes). She has a sign of insulin resistance: acanthosis nigricans encircling her neck Pt smoker: Smoker, about 5cigarettes/day or less, (40+ yrs). She has been taking Wellbutrin since about June 2017 and has decreased her smoking. She expresses a strong desire to stop smoking.   Pt meds include: Statin :yes ASA: Yes Other anticoagulants/antiplatelets: no   Past Medical History:  Diagnosis Date  . Arthritis    back problems to be evaluated by neurology next month  . Cancer ([place]) 2009   L, Breast, radiation and surgery.  [name] COPD (chronic obstructive pulmonary disease) ([place])   . Depression   . GERD (gastroesophageal reflux disease)   . Hyperlipemia   . Hypertension   . Peripheral vascular disease ([place]) 2013    Social History Social History  Substance Use Topics  . Smoking status: Light Tobacco Smoker  Packs/day: 0.25    Years: 40.00    Types: Cigarettes  . Smokeless tobacco: Never Used     Comment: 3 cigarettes per day  . Alcohol use No    Family History Family History  Problem Relation Age of Onset  . Hyperlipidemia Mother   . Hypertension Mother   . Heart disease Mother        Atrial Fib.  . Hyperlipidemia Sister   . Hypertension Sister   . Heart disease Sister        Before age 52,  CHF  . Heart attack Brother   . Hyperlipidemia Brother    . Hypertension Brother   . Hypertension Daughter     Past Surgical History:  Procedure Laterality Date  . APPENDECTOMY  1980  . BREAST LUMPECTOMY Left    s/p radiation therapy   . BREAST SURGERY    . COLONOSCOPY  07/2015  . COLONOSCOPY WITH PROPOFOL N/A 06/26/2015   Procedure: COLONOSCOPY WITH PROPOFOL;  Surgeon: [name], MD;  Location: [facility];  Service: Endoscopy;  Laterality: N/A;  . PERIPHERAL VASCULAR CATHETERIZATION N/A 10/22/2015   Procedure: Abdominal Aortogram w/Lower Extremity;  Surgeon: [name], MD;  Location: [facility];  Service: Cardiovascular;  Laterality: N/A;  . TUBAL LIGATION  1991    Allergies  Allergen Reactions  . Vicodin [Hydrocodone-Acetaminophen] Nausea And Vomiting and Other (See Comments)    Sweating    Current Outpatient Prescriptions  Medication Sig Dispense Refill  . albuterol (PROVENTIL HFA;VENTOLIN HFA) 108 (90 Base) MCG/ACT inhaler Inhale 2 puffs into the lungs every 6 (six) hours as needed for wheezing. 3 Inhaler 0  . aspirin EC 81 MG tablet Take 1 tablet (81 mg total) by mouth daily. 90 tablet 3  . atorvastatin (LIPITOR) 20 MG tablet Take 1 tablet (20 mg total) by mouth daily. 90 tablet 0  . buPROPion (WELLBUTRIN SR) 150 MG 12 hr tablet TAKE 1 TABLET TWICE DAILY 180 tablet 0  . diphenhydrAMINE (BENADRYL) 50 MG capsule Take 1 capsule (50 mg total) by mouth at bedtime as needed. 30 capsule 2  . Fluticasone-Salmeterol (ADVAIR) 100-50 MCG/DOSE AEPB Inhale 1 puff into the lungs 2 (two) times daily. 180 each 3  . gabapentin (NEURONTIN) 300 MG capsule Take by mouth 300 mg in the day as needed for pain and 300 mg at night 180 capsule 3  . lisinopril-hydrochlorothiazide (ZESTORETIC) 20-12.5 MG tablet Take 1 tablet by mouth daily. 30 tablet 1  . naproxen (NAPROSYN) 500 MG tablet Take 1 tablet (500 mg total) by mouth 2 (two) times daily with a meal. 60 tablet 5  . omeprazole (PRILOSEC) 40 MG capsule Take 1 capsule (40 mg  total) by mouth daily. 30 capsule 3  . potassium chloride SA (K-DUR,KLOR-CON) 20 MEQ tablet Take 1 tablet (20 mEq total) by mouth daily. 30 tablet 5   No current facility-administered medications for this visit.     ROS: See HPI for pertinent positives and negatives.   Physical Examination  Vitals:   10/01/16 1332  BP: 122/80  Pulse: 60  Resp: (!) 59  Temp: 98.7 F (37.1 C)  TempSrc: Oral  SpO2: 96%  Weight: 144 lb 14.4 oz (65.7 kg)  Height: 5\' 5"  (1.651 m)   Body mass index is 24.11 kg/m.  General: A&O x 3, WDWN. Neck: mild thyromegaly, right lobe larger than left, mild acanthosis nigricans.  Gait: normal Eyes: PERRLA. Pulmonary: Respirations are non labored, CTAB, without wheezes, rales, or rhonchi. Cardiac:  regular rhythm, no detected murmur.     Carotid Bruits Right Left   Negative Negative  Aorta is not palpable. Radial pulses: 2+ palpable and =   VASCULAR EXAM: Extremitieswithout ischemic changes  without Gangrene; without open wounds. Dry cracking skin at posterior aspect of heels, no fissures. Plantar warts.      LE Pulses Right Left   FEMORAL Not palpable Not palpable    POPLITEAL not palpable  not palpable   POSTERIOR TIBIAL not palpable  not palpable    DORSALIS PEDIS  ANTERIOR TIBIAL not palpable  not palpable    Abdomen: soft, NT, no palpable masses. Skin: no rashes, no ulcers. Small consolidated abscess below left clavicle. See extremities.  Musculoskeletal: no muscle wasting or atrophy. Neurologic: A&O X 3; Appropriate Affect ; SENSATION: normal; MOTOR FUNCTION: moving all extremities equally, motor strength 5/5 throughout in UE's, 3/5 in LE's. Speech is fluent/normal. CN 2-12 grossly intact.  Positive straight leg raise test in both legs.    ASSESSMENT: Logan Vegh is a 61 y.o. female presents with PAD, known aortoiliac artery disease,has no signs of ischemia in her feet/legs.  Dr. Scot Dock performed an arteriogram on 10/22/2015. This showed that her infrarenal aorta was occluded below the inferior mesenteric artery. There was reconstitution of the external iliac arteries bilaterally. She had no significant disease of the aortic arch or right or left subclavian arteries.  She has been trying to walk more, her claudication starts in her hips after walking about 200 feet, extends distally to her calves, relieved by rest.  Her femoral and pedal pulses are not palpable. There are no signs of ischemia in her feet or legs.    She was evaluated by Dr. Vertell Limber and he determined that the lumber spine issues she has are minor, not severe enough to cause the symptoms in her legs.  She has painful plantar warts that she has been self treating, states she cannot afford the co-pay to see a podiatrist.   She denies a known diagnosis of DM, she does have a sign of insulin resistance: acanthosis nigricans. Unfortunately she resumed smoking.    DATA  ABI (Date: 10/01/2016):  R:   ABI: 0.58 (was 0.60 on 05-28-16),   PT: mono  DP: mono  TBI:  0.45 (was 0.47)  L:   ABI: 0.54 (was 0.57),   PT: mono  DP: mono  TBI: 0.43 (was 0.42)  Stable bilateral ABI with moderaet arterial occlusive disease.    10/28/13 CT abd/pelvis w/o contrast requested by Dr. Annitta Needs for hematuria, right flank pain: Densely calcified atherosclerotic plaque extends along the abdominal aorta into the iliac arteries. This is advanced for age.  07/16/15 MR Lumbar spine w/o contrast requested by Dr. Vertell Limber: IMPRESSION: Mild lumbar degenerative change. Normal alignment Small right foraminal disc protrusion L3-4 with associated disc Bulging.    PLAN:  The patient was counseled re smoking cessation  and given several free resources re smoking cessation.   Continue graduated walking program.    Based on the patient's vascular studies and examination, pt will return to clinic in 6 months with ABI's. I advised her to notify us if she develops concerns re the circulation in her feet or legs.   I discussed in depth with the patient the nature of atherosclerosis, and emphasized the importance of maximal medical management including strict control of blood pressure, blood glucose, and lipid levels, obtaining regular exercise, and cessation of smoking.  The patient is aware that without maximal medical management  the underlying atherosclerotic disease process will progress, limiting the benefit of any interventions.  The patient was given information about PAD including signs, symptoms, treatment, what symptoms should prompt the patient to seek immediate medical care, and risk reduction measures to take.  Clemon Chambers, RN, MSN, FNP-C [facility] Phone: [phone]  Clinic MD: Scot Dock  10/01/16 1:34 PM

## 2016-10-01 NOTE — Patient Instructions (Signed)
Steps to Quit Smoking Smoking tobacco can be bad for your health. It can also affect almost every organ in your body. Smoking puts you and people around you at risk for many serious long-lasting (chronic) diseases. Quitting smoking is hard, but it is one of the best things that you can do for your health. It is never too late to quit. What are the benefits of quitting smoking? When you quit smoking, you lower your risk for getting serious diseases and conditions. They can include:  Lung cancer or lung disease.  Heart disease.  Stroke.  Heart attack.  Not being able to have children (infertility).  Weak bones (osteoporosis) and broken bones (fractures).  If you have coughing, wheezing, and shortness of breath, those symptoms may get better when you quit. You may also get sick less often. If you are pregnant, quitting smoking can help to lower your chances of having a baby of low birth weight. What can I do to help me quit smoking? Talk with your doctor about what can help you quit smoking. Some things you can do (strategies) include:  Quitting smoking totally, instead of slowly cutting back how much you smoke over a period of time.  Going to in-person counseling. You are more likely to quit if you go to many counseling sessions.  Using resources and support systems, such as: ? Online chats with a counselor. ? Phone quitlines. ? Printed self-help materials. ? Support groups or group counseling. ? Text messaging programs. ? Mobile phone apps or applications.  Taking medicines. Some of these medicines may have nicotine in them. If you are pregnant or breastfeeding, do not take any medicines to quit smoking unless your doctor says it is okay. Talk with your doctor about counseling or other things that can help you.  Talk with your doctor about using more than one strategy at the same time, such as taking medicines while you are also going to in-person counseling. This can help make  quitting easier. What things can I do to make it easier to quit? Quitting smoking might feel very hard at first, but there is a lot that you can do to make it easier. Take these steps:  Talk to your family and friends. Ask them to support and encourage you.  Call phone quitlines, reach out to support groups, or work with a counselor.  Ask people who smoke to not smoke around you.  Avoid places that make you want (trigger) to smoke, such as: ? Bars. ? Parties. ? Smoke-break areas at work.  Spend time with people who do not smoke.  Lower the stress in your life. Stress can make you want to smoke. Try these things to help your stress: ? Getting regular exercise. ? Deep-breathing exercises. ? Yoga. ? Meditating. ? Doing a body scan. To do this, close your eyes, focus on one area of your body at a time from head to toe, and notice which parts of your body are tense. Try to relax the muscles in those areas.  Download or buy apps on your mobile phone or tablet that can help you stick to your quit plan. There are many free apps, such as QuitGuide from the CDC (Centers for Disease Control and Prevention). You can find more support from smokefree.gov and other websites.  This information is not intended to replace advice given to you by your health care provider. Make sure you discuss any questions you have with your health care provider. Document Released: 12/14/2008 Document   Revised: 10/16/2015 Document Reviewed: 07/04/2014 Elsevier Interactive Patient Education  2018 Elsevier Inc.     Peripheral Vascular Disease Peripheral vascular disease (PVD) is a disease of the blood vessels that are not part of your heart and brain. A simple term for PVD is poor circulation. In most cases, PVD narrows the blood vessels that carry blood from your heart to the rest of your body. This can result in a decreased supply of blood to your arms, legs, and internal organs, like your stomach or kidneys.  However, it most often affects a person's lower legs and feet. There are two types of PVD.  Organic PVD. This is the more common type. It is caused by damage to the structure of blood vessels.  Functional PVD. This is caused by conditions that make blood vessels contract and tighten (spasm).  Without treatment, PVD tends to get worse over time. PVD can also lead to acute ischemic limb. This is when an arm or limb suddenly has trouble getting enough blood. This is a medical emergency. Follow these instructions at home:  Take medicines only as told by your doctor.  Do not use any tobacco products, including cigarettes, chewing tobacco, or electronic cigarettes. If you need help quitting, ask your doctor.  Lose weight if you are overweight, and maintain a healthy weight as told by your doctor.  Eat a diet that is low in fat and cholesterol. If you need help, ask your doctor.  Exercise regularly. Ask your doctor for some good activities for you.  Take good care of your feet. ? Wear comfortable shoes that fit well. ? Check your feet often for any cuts or sores. Contact a doctor if:  You have cramps in your legs while walking.  You have leg pain when you are at rest.  You have coldness in a leg or foot.  Your skin changes.  You are unable to get or have an erection (erectile dysfunction).  You have cuts or sores on your feet that are not healing. Get help right away if:  Your arm or leg turns cold and blue.  Your arms or legs become red, warm, swollen, painful, or numb.  You have chest pain or trouble breathing.  You suddenly have weakness in your face, arm, or leg.  You become very confused or you cannot speak.  You suddenly have a very bad headache.  You suddenly cannot see. This information is not intended to replace advice given to you by your health care provider. Make sure you discuss any questions you have with your health care provider. Document Released:  05/14/2009 Document Revised: 07/26/2015 Document Reviewed: 07/28/2013 Elsevier Interactive Patient Education  2017 Elsevier Inc.  

## 2016-10-03 ENCOUNTER — Ambulatory Visit: Payer: Medicare HMO | Attending: Family Medicine | Admitting: Family Medicine

## 2016-10-03 ENCOUNTER — Encounter: Payer: Self-pay | Admitting: Family Medicine

## 2016-10-03 DIAGNOSIS — F329 Major depressive disorder, single episode, unspecified: Secondary | ICD-10-CM | POA: Insufficient documentation

## 2016-10-03 DIAGNOSIS — E7800 Pure hypercholesterolemia, unspecified: Secondary | ICD-10-CM | POA: Diagnosis not present

## 2016-10-03 DIAGNOSIS — Z853 Personal history of malignant neoplasm of breast: Secondary | ICD-10-CM | POA: Diagnosis not present

## 2016-10-03 DIAGNOSIS — J418 Mixed simple and mucopurulent chronic bronchitis: Secondary | ICD-10-CM | POA: Diagnosis not present

## 2016-10-03 DIAGNOSIS — I739 Peripheral vascular disease, unspecified: Secondary | ICD-10-CM | POA: Diagnosis not present

## 2016-10-03 DIAGNOSIS — Z7982 Long term (current) use of aspirin: Secondary | ICD-10-CM | POA: Diagnosis not present

## 2016-10-03 DIAGNOSIS — F419 Anxiety disorder, unspecified: Secondary | ICD-10-CM | POA: Diagnosis not present

## 2016-10-03 DIAGNOSIS — F17200 Nicotine dependence, unspecified, uncomplicated: Secondary | ICD-10-CM

## 2016-10-03 DIAGNOSIS — K449 Diaphragmatic hernia without obstruction or gangrene: Secondary | ICD-10-CM | POA: Diagnosis not present

## 2016-10-03 DIAGNOSIS — Z79899 Other long term (current) drug therapy: Secondary | ICD-10-CM | POA: Insufficient documentation

## 2016-10-03 DIAGNOSIS — Z7951 Long term (current) use of inhaled steroids: Secondary | ICD-10-CM | POA: Insufficient documentation

## 2016-10-03 DIAGNOSIS — F17210 Nicotine dependence, cigarettes, uncomplicated: Secondary | ICD-10-CM | POA: Insufficient documentation

## 2016-10-03 DIAGNOSIS — I10 Essential (primary) hypertension: Secondary | ICD-10-CM | POA: Diagnosis not present

## 2016-10-03 DIAGNOSIS — I70219 Atherosclerosis of native arteries of extremities with intermittent claudication, unspecified extremity: Secondary | ICD-10-CM

## 2016-10-03 DIAGNOSIS — E876 Hypokalemia: Secondary | ICD-10-CM | POA: Diagnosis not present

## 2016-10-03 DIAGNOSIS — F32A Depression, unspecified: Secondary | ICD-10-CM

## 2016-10-03 DIAGNOSIS — K219 Gastro-esophageal reflux disease without esophagitis: Secondary | ICD-10-CM | POA: Diagnosis not present

## 2016-10-03 MED ORDER — FLUTICASONE-SALMETEROL 100-50 MCG/DOSE IN AEPB: 1.0000 | INHALATION_SPRAY | Freq: Two times a day (BID) | RESPIRATORY_TRACT | 3 refills | Status: DC

## 2016-10-03 MED ORDER — ASPIRIN EC 81 MG PO TBEC: 81.0000 mg | DELAYED_RELEASE_TABLET | Freq: Every day | ORAL | 3 refills | Status: AC

## 2016-10-03 MED ORDER — ALBUTEROL SULFATE HFA 108 (90 BASE) MCG/ACT IN AERS: 2.0000 | INHALATION_SPRAY | Freq: Four times a day (QID) | RESPIRATORY_TRACT | 3 refills | Status: DC | PRN

## 2016-10-03 MED ORDER — GABAPENTIN 300 MG PO CAPS: ORAL_CAPSULE | ORAL | 3 refills | Status: DC

## 2016-10-03 MED ORDER — BUPROPION HCL ER (SR) 150 MG PO TB12: 150.0000 mg | ORAL_TABLET | Freq: Two times a day (BID) | ORAL | 3 refills | Status: DC

## 2016-10-03 MED ORDER — LISINOPRIL-HYDROCHLOROTHIAZIDE 20-12.5 MG PO TABS: 1.0000 | ORAL_TABLET | Freq: Every day | ORAL | 3 refills | Status: DC

## 2016-10-03 MED ORDER — POTASSIUM CHLORIDE CRYS ER 20 MEQ PO TBCR: 20.0000 meq | EXTENDED_RELEASE_TABLET | Freq: Every day | ORAL | 3 refills | Status: DC

## 2016-10-03 MED ORDER — ATORVASTATIN CALCIUM 20 MG PO TABS: 20.0000 mg | ORAL_TABLET | Freq: Every day | ORAL | 3 refills | Status: DC

## 2016-10-03 MED ORDER — OMEPRAZOLE 40 MG PO CPDR: 40.0000 mg | DELAYED_RELEASE_CAPSULE | Freq: Every day | ORAL | 5 refills | Status: DC

## 2016-10-03 NOTE — Patient Instructions (Addendum)
Brandy Stevenson was seen today for gastroesophageal reflux.  Diagnoses and all orders for this visit:  Hiatal hernia -     omeprazole (PRILOSEC) 40 MG capsule; Take 1 capsule (40 mg total) by mouth daily.  Gastroesophageal reflux disease, esophagitis presence not specified -     omeprazole (PRILOSEC) 40 MG capsule; Take 1 capsule (40 mg total) by mouth daily.  Mixed simple and mucopurulent chronic bronchitis (HCC) -     albuterol (PROVENTIL HFA;VENTOLIN HFA) 108 (90 Base) MCG/ACT inhaler; Inhale 2 puffs into the lungs every 6 (six) hours as needed for wheezing. -     Fluticasone-Salmeterol (ADVAIR) 100-50 MCG/DOSE AEPB; Inhale 1 puff into the lungs 2 (two) times daily.  Pure hypercholesterolemia -     atorvastatin (LIPITOR) 20 MG tablet; Take 1 tablet (20 mg total) by mouth daily.  Smoking -     buPROPion (WELLBUTRIN SR) 150 MG 12 hr tablet; Take 1 tablet (150 mg total) by mouth 2 (two) times daily.  Anxiety and depression -     buPROPion (WELLBUTRIN SR) 150 MG 12 hr tablet; Take 1 tablet (150 mg total) by mouth 2 (two) times daily.  Atherosclerotic PVD with intermittent claudication (HCC) -     aspirin EC 81 MG tablet; Take 1 tablet (81 mg total) by mouth daily. -     gabapentin (NEURONTIN) 300 MG capsule; Take by mouth 300 mg in the day as needed for pain and 300 mg at night  Hypokalemia -     potassium chloride SA (K-DUR,KLOR-CON) 20 MEQ tablet; Take 1 tablet (20 mEq total) by mouth daily.  Essential hypertension, benign -     lisinopril-hydrochlorothiazide (ZESTORETIC) 20-12.5 MG tablet; Take 1 tablet by mouth daily. -     potassium chloride SA (K-DUR,KLOR-CON) 20 MEQ tablet; Take 1 tablet (20 mEq total) by mouth daily.  cholesterol and metabolic panel check today, checking potassium   F/u in   Dr. Adrian Blackwater

## 2016-10-03 NOTE — Assessment & Plan Note (Signed)
A: followed closely by vascular. Compliant with walking program. Working towards smoking cessation P: Continue aspirin, lipitor, gabapentin for pain Wellbutrin from smoking cessation and depression

## 2016-10-03 NOTE — Assessment & Plan Note (Signed)
Well controlled with  prilosec 40 mg daily Given hiatal hernia Plan to continue

## 2016-10-03 NOTE — Progress Notes (Signed)
Subjective:  Patient ID: Brandy Stevenson, female    DOB: [birth year]  Age: 61 y.o. MRN: [id number]  CC: Gastroesophageal Reflux   HPI Brandy Stevenson has PAD, HTN, smoker, hx of breast cancer, depression, she is disable and receiving social security disability benefits she presents for   1. GERD: taking prilosec 40 mg daily.  Reports GERD symptoms are well controlled. Denies abdominal pain and bloating.   She had a barium swallow study on 04/22/2016: IMPRESSION: 1. Small sliding-type hiatal hernia and moderate inducible GE reflux. 2. Normal esophageal motility.  No esophageal lesions or stricture.   2. PAD: she is followed by Vascular every 6 months. She has cut down smoking to 1-3 cigs per day. She still has pain when she walks especially in right leg and up hill but she walks 3-4 times a day to visit her niece and great-niece. She is pleased with her progress. She is hoping to avoid re-perfusion surgery. She reports gabapentin controls nighttime pain.     Social History  Substance Use Topics  . Smoking status: Light Tobacco Smoker    Packs/day: 0.25    Years: 40.00    Types: Cigarettes  . Smokeless tobacco: Never Used     Comment: 3 cigarettes per day  . Alcohol use No    Outpatient Medications Prior to Visit  Medication Sig Dispense Refill  . albuterol (PROVENTIL HFA;VENTOLIN HFA) 108 (90 Base) MCG/ACT inhaler Inhale 2 puffs into the lungs every 6 (six) hours as needed for wheezing. 3 Inhaler 0  . aspirin EC 81 MG tablet Take 1 tablet (81 mg total) by mouth daily. 90 tablet 3  . atorvastatin (LIPITOR) 20 MG tablet Take 1 tablet (20 mg total) by mouth daily. 90 tablet 0  . buPROPion (WELLBUTRIN SR) 150 MG 12 hr tablet TAKE 1 TABLET TWICE DAILY 180 tablet 0  . diphenhydrAMINE (BENADRYL) 50 MG capsule Take 1 capsule (50 mg total) by mouth at bedtime as needed. 30 capsule 2  . Fluticasone-Salmeterol (ADVAIR) 100-50 MCG/DOSE AEPB Inhale 1 puff into the lungs 2 (two) times daily.  180 each 3  . gabapentin (NEURONTIN) 300 MG capsule Take by mouth 300 mg in the day as needed for pain and 300 mg at night 180 capsule 3  . lisinopril-hydrochlorothiazide (ZESTORETIC) 20-12.5 MG tablet Take 1 tablet by mouth daily. 30 tablet 1  . naproxen (NAPROSYN) 500 MG tablet Take 1 tablet (500 mg total) by mouth 2 (two) times daily with a meal. 60 tablet 5  . omeprazole (PRILOSEC) 40 MG capsule Take 1 capsule (40 mg total) by mouth daily. 30 capsule 3  . potassium chloride SA (K-DUR,KLOR-CON) 20 MEQ tablet Take 1 tablet (20 mEq total) by mouth daily. 30 tablet 5   No facility-administered medications prior to visit.     ROS Review of Systems  Constitutional: Negative for chills and fever.  HENT: Negative for trouble swallowing.   Eyes: Negative for visual disturbance.  Respiratory: Negative for shortness of breath.   Cardiovascular: Negative for chest pain.  Gastrointestinal: Negative for abdominal pain and blood in stool.  Genitourinary: Negative for frequency.  Musculoskeletal: Positive for arthralgias and myalgias. Negative for back pain and neck pain.  Skin: Negative for rash.  Allergic/Immunologic: Negative for immunocompromised state.  Neurological: Negative for headaches.  Hematological: Negative for adenopathy. Does not bruise/bleed easily.  Psychiatric/Behavioral: Positive for dysphoric mood. Negative for sleep disturbance and suicidal ideas.    Objective:  BP 111/72   Pulse 62   Temp  98 F (36.7 C) (Oral)   Ht _0  (1.651 m)   Wt 145 lb 6.4 oz (66 kg)   SpO2 100%   BMI 24.20 kg/m   BP/Weight 10/03/2016 10/01/2016 7/41/2878  Systolic BP 676 720 947  Diastolic BP 72 80 72  Wt. (Lbs) 145.4 144.9 147.4  BMI 24.2 24.11 24.53    Physical Exam  Constitutional: She is oriented to person, place, and time. She appears well-developed and well-nourished. No distress.  HENT:  Head: Normocephalic and atraumatic.  Right Ear: Tympanic membrane, external ear and ear canal  normal.  Left Ear: Tympanic membrane, external ear and ear canal normal.  Eyes: Pupils are equal, round, and reactive to light. Conjunctivae and EOM are normal.  Neck: Normal range of motion. No thyromegaly present.  Cardiovascular: Normal rate, regular rhythm and normal heart sounds.  Exam reveals decreased pulses.   Pulmonary/Chest: Effort normal and breath sounds normal.  Abdominal: Soft. Bowel sounds are normal. She exhibits no distension and no mass. There is no tenderness. There is no rebound and no guarding.  Musculoskeletal: She exhibits no edema.  Neurological: She is alert and oriented to person, place, and time.  Skin: Skin is warm and dry. No rash noted.  Psychiatric: She has a normal mood and affect.     Assessment & Plan:  Len was seen today for gastroesophageal reflux.  Diagnoses and all orders for this visit:  Hiatal hernia -     omeprazole (PRILOSEC) 40 MG capsule; Take 1 capsule (40 mg total) by mouth daily.  Gastroesophageal reflux disease, esophagitis presence not specified -     omeprazole (PRILOSEC) 40 MG capsule; Take 1 capsule (40 mg total) by mouth daily.  Mixed simple and mucopurulent chronic bronchitis (HCC) -     albuterol (PROVENTIL HFA;VENTOLIN HFA) 108 (90 Base) MCG/ACT inhaler; Inhale 2 puffs into the lungs every 6 (six) hours as needed for wheezing. -     Fluticasone-Salmeterol (ADVAIR) 100-50 MCG/DOSE AEPB; Inhale 1 puff into the lungs 2 (two) times daily.  Pure hypercholesterolemia -     atorvastatin (LIPITOR) 20 MG tablet; Take 1 tablet (20 mg total) by mouth daily. -     Lipid Panel  Smoking -     buPROPion (WELLBUTRIN SR) 150 MG 12 hr tablet; Take 1 tablet (150 mg total) by mouth 2 (two) times daily.  Anxiety and depression -     buPROPion (WELLBUTRIN SR) 150 MG 12 hr tablet; Take 1 tablet (150 mg total) by mouth 2 (two) times daily.  Atherosclerotic PVD with intermittent claudication (HCC) -     aspirin EC 81 MG tablet; Take 1 tablet  (81 mg total) by mouth daily. -     gabapentin (NEURONTIN) 300 MG capsule; Take by mouth 300 mg in the day as needed for pain and 300 mg at night -     Lipid Panel  Hypokalemia -     potassium chloride SA (K-DUR,KLOR-CON) 20 MEQ tablet; Take 1 tablet (20 mEq total) by mouth daily. -     Lipid Panel -     CMP14+EGFR  Essential hypertension, benign -     lisinopril-hydrochlorothiazide (ZESTORETIC) 20-12.5 MG tablet; Take 1 tablet by mouth daily. -     potassium chloride SA (K-DUR,KLOR-CON) 20 MEQ tablet; Take 1 tablet (20 mEq total) by mouth daily. -     CMP14+EGFR   There are no diagnoses linked to this encounter.  No orders of the defined types were placed in this encounter.  Follow-up: Return in about 3 months (around 01/03/2017) for HTN, smoking, GERD.   Boykin Nearing MD

## 2016-10-04 LAB — CMP14+EGFR
ALT: 28 IU/L (ref 0–32)
AST: 35 IU/L (ref 0–40)
Albumin/Globulin Ratio: 1.4 (ref 1.2–2.2)
Albumin: 4.3 g/dL (ref 3.5–5.5)
Alkaline Phosphatase: 117 IU/L (ref 39–117)
BUN/Creatinine Ratio: 10 (ref 9–23)
BUN: 9 mg/dL (ref 6–24)
Bilirubin Total: <0.2 mg/dL (ref 0.0–1.2)
CO2: 27 mmol/L (ref 20–29)
Calcium: 9.7 mg/dL (ref 8.7–10.2)
Chloride: 101 mmol/L (ref 96–106)
Creatinine, Ser: 0.87 mg/dL (ref 0.57–1.00)
GFR calc Af Amer: 84 mL/min/{1.73_m2} (ref >59)
GFR calc non Af Amer: 73 mL/min/{1.73_m2} (ref >59)
Globulin, Total: 3.1 g/dL (ref 1.5–4.5)
Glucose: 69 mg/dL (ref 65–99)
Potassium: 3.1 mmol/L — ABNORMAL LOW (ref 3.5–5.2)
Sodium: 142 mmol/L (ref 134–144)
Total Protein: 7.4 g/dL (ref 6.0–8.5)

## 2016-10-04 LAB — LIPID PANEL
Chol/HDL Ratio: 3.2 ratio (ref 0.0–4.4)
Cholesterol, Total: 161 mg/dL (ref 100–199)
HDL: 51 mg/dL (ref >39)
LDL Calculated: 84 mg/dL (ref 0–99)
Triglycerides: 130 mg/dL (ref 0–149)
VLDL Cholesterol Cal: 26 mg/dL (ref 5–40)

## 2016-10-07 NOTE — Addendum Note (Signed)
Addended by: Lianne Cure A on: 10/07/2016 04:52 PM   Modules accepted: Orders

## 2016-10-16 ENCOUNTER — Telehealth: Payer: Self-pay

## 2016-10-16 NOTE — Telephone Encounter (Signed)
Pt was called and informed of lab results. 

## 2017-01-05 ENCOUNTER — Encounter: Payer: Self-pay | Admitting: Internal Medicine

## 2017-01-05 ENCOUNTER — Ambulatory Visit: Payer: Medicare HMO | Attending: Internal Medicine | Admitting: Internal Medicine

## 2017-01-05 VITALS — BP 116/76 | HR 59 | Temp 98.7°F | Resp 16 | Wt 145.6 lb

## 2017-01-05 DIAGNOSIS — G8929 Other chronic pain: Secondary | ICD-10-CM | POA: Insufficient documentation

## 2017-01-05 DIAGNOSIS — Z8249 Family history of ischemic heart disease and other diseases of the circulatory system: Secondary | ICD-10-CM | POA: Diagnosis not present

## 2017-01-05 DIAGNOSIS — Z79899 Other long term (current) drug therapy: Secondary | ICD-10-CM | POA: Insufficient documentation

## 2017-01-05 DIAGNOSIS — M25511 Pain in right shoulder: Secondary | ICD-10-CM | POA: Diagnosis not present

## 2017-01-05 DIAGNOSIS — Z9851 Tubal ligation status: Secondary | ICD-10-CM | POA: Insufficient documentation

## 2017-01-05 DIAGNOSIS — I739 Peripheral vascular disease, unspecified: Secondary | ICD-10-CM | POA: Insufficient documentation

## 2017-01-05 DIAGNOSIS — F419 Anxiety disorder, unspecified: Secondary | ICD-10-CM | POA: Insufficient documentation

## 2017-01-05 DIAGNOSIS — M25512 Pain in left shoulder: Secondary | ICD-10-CM | POA: Insufficient documentation

## 2017-01-05 DIAGNOSIS — Z9889 Other specified postprocedural states: Secondary | ICD-10-CM | POA: Insufficient documentation

## 2017-01-05 DIAGNOSIS — L98499 Non-pressure chronic ulcer of skin of other sites with unspecified severity: Secondary | ICD-10-CM | POA: Diagnosis not present

## 2017-01-05 DIAGNOSIS — F32A Depression, unspecified: Secondary | ICD-10-CM

## 2017-01-05 DIAGNOSIS — L97509 Non-pressure chronic ulcer of other part of unspecified foot with unspecified severity: Secondary | ICD-10-CM | POA: Diagnosis not present

## 2017-01-05 DIAGNOSIS — I10 Essential (primary) hypertension: Secondary | ICD-10-CM | POA: Diagnosis not present

## 2017-01-05 DIAGNOSIS — F17210 Nicotine dependence, cigarettes, uncomplicated: Secondary | ICD-10-CM | POA: Insufficient documentation

## 2017-01-05 DIAGNOSIS — G500 Trigeminal neuralgia: Secondary | ICD-10-CM | POA: Diagnosis not present

## 2017-01-05 DIAGNOSIS — K219 Gastro-esophageal reflux disease without esophagitis: Secondary | ICD-10-CM | POA: Diagnosis not present

## 2017-01-05 DIAGNOSIS — J449 Chronic obstructive pulmonary disease, unspecified: Secondary | ICD-10-CM | POA: Insufficient documentation

## 2017-01-05 DIAGNOSIS — F17200 Nicotine dependence, unspecified, uncomplicated: Secondary | ICD-10-CM

## 2017-01-05 DIAGNOSIS — L602 Onychogryphosis: Secondary | ICD-10-CM | POA: Diagnosis not present

## 2017-01-05 DIAGNOSIS — F329 Major depressive disorder, single episode, unspecified: Secondary | ICD-10-CM | POA: Insufficient documentation

## 2017-01-05 DIAGNOSIS — Z853 Personal history of malignant neoplasm of breast: Secondary | ICD-10-CM | POA: Diagnosis not present

## 2017-01-05 DIAGNOSIS — D649 Anemia, unspecified: Secondary | ICD-10-CM | POA: Insufficient documentation

## 2017-01-05 NOTE — Patient Instructions (Addendum)
Continue to work on trying to quit smoking.  You can call [phone] Quit Now to request the free nicotine patches.  You have been referred to Podiatry.   Please go to [facility] to get x-rays of your shoulder done as ordered.

## 2017-01-05 NOTE — Progress Notes (Deleted)
Patient ID: Brandy Stevenson, female    DOB: 1955/09/11  MRN: [id number]  CC: re-establish   Subjective: Brandy Stevenson is a 61 y.o. female who presents for Her concerns today include:  Hx of PAD, HTN, HL, tob dep, LT breast CA (lumpectomy and XRT 2009), GERD, COPD, Trigeminal neuralgia RT, dep/anxiety  1. Pain in both arms: -cold worse RT shoulder. More with movement  2. COPD -down to 5 cig from 1 pk a day. Try to quit. Stop Wellbutrin. Made me feel bad. Trying to quit on her own.   3. Depression: it all depends on the day. -daughter currently staying with her; feels crowded. More happy when she is by herself. Children going through somethings that are depressing to her. -likes to walk but painful callouses. Also can not walk 1 block without pain in calf. Better walking down hill  4. HTN: compliant Patient Active Problem List   Diagnosis Date Noted  . Cracked skin on feet 06/27/2016  . Hiatal hernia 05/01/2016  . DJD (degenerative joint disease) of cervical spine 04/17/2016  . History of breast cancer 02/18/2016  . Foot callus 02/18/2016  . Hypokalemia 11/23/2015  . Anxiety and depression 05/22/2015  . Insomnia 03/15/2015  . Hamstring tightness of both lower extremities 04/04/2014  . Unspecified vitamin D deficiency 04/08/2013  . Other and unspecified hyperlipidemia 04/08/2013  . Bilateral leg pain 03/22/2013  . Atherosclerotic PVD with intermittent claudication ([place]) 03/22/2013  . Essential hypertension, benign 02/23/2013  . GERD (gastroesophageal reflux disease) 02/23/2013  . COPD (chronic obstructive pulmonary disease) ([place]) 02/23/2013  . Smoking 02/23/2013  . History of lumpectomy 02/23/2013     Current Outpatient Medications on File Prior to Visit  Medication Sig Dispense Refill  . albuterol (PROVENTIL HFA;VENTOLIN HFA) 108 (90 Base) MCG/ACT inhaler Inhale 2 puffs into the lungs every 6 (six) hours as needed for wheezing. 3 Inhaler 3  . aspirin EC 81 MG tablet  Take 1 tablet (81 mg total) by mouth daily. 90 tablet 3  . atorvastatin (LIPITOR) 20 MG tablet Take 1 tablet (20 mg total) by mouth daily. 90 tablet 3  . buPROPion (WELLBUTRIN SR) 150 MG 12 hr tablet Take 1 tablet (150 mg total) by mouth 2 (two) times daily. 180 tablet 3  . diphenhydrAMINE (BENADRYL) 50 MG capsule Take 1 capsule (50 mg total) by mouth at bedtime as needed. 30 capsule 2  . Fluticasone-Salmeterol (ADVAIR) 100-50 MCG/DOSE AEPB Inhale 1 puff into the lungs 2 (two) times daily. 180 each 3  . gabapentin (NEURONTIN) 300 MG capsule Take by mouth 300 mg in the day as needed for pain and 300 mg at night 180 capsule 3  . lisinopril-hydrochlorothiazide (ZESTORETIC) 20-12.5 MG tablet Take 1 tablet by mouth daily. 90 tablet 3  . naproxen (NAPROSYN) 500 MG tablet Take 1 tablet (500 mg total) by mouth 2 (two) times daily with a meal. 60 tablet 5  . omeprazole (PRILOSEC) 40 MG capsule Take 1 capsule (40 mg total) by mouth daily. 30 capsule 5  . potassium chloride SA (K-DUR,KLOR-CON) 20 MEQ tablet Take 1 tablet (20 mEq total) by mouth daily. 90 tablet 3   No current facility-administered medications on file prior to visit.     Allergies  Allergen Reactions  . Vicodin [Hydrocodone-Acetaminophen] Nausea And Vomiting and Other (See Comments)    Sweating    Social History   Socioeconomic History  . Marital status: Married    Spouse name: Not on file  . Number of children:  Not on file  . Years of education: Not on file  . Highest education level: Not on file  Social Needs  . Financial resource strain: Not on file  . Food insecurity - worry: Not on file  . Food insecurity - inability: Not on file  . Transportation needs - medical: Not on file  . Transportation needs - non-medical: Not on file  Occupational History  . Not on file  Tobacco Use  . Smoking status: Light Tobacco Smoker    Packs/day: 0.25    Years: 40.00    Pack years: 10.00    Types: Cigarettes  . Smokeless tobacco:  Never Used  . Tobacco comment: 3 cigarettes per day  Substance and Sexual Activity  . Alcohol use: No  . Drug use: No  . Sexual activity: Not Currently  Other Topics Concern  . Not on file  Social History Narrative  . Not on file    Family History  Problem Relation Age of Onset  . Hyperlipidemia Mother   . Hypertension Mother   . Heart disease Mother        Atrial Fib.  . Hyperlipidemia Sister   . Hypertension Sister   . Heart disease Sister        Before age 43,  CHF  . Heart attack Brother   . Hyperlipidemia Brother   . Hypertension Brother   . Hypertension Daughter     Past Surgical History:  Procedure Laterality Date  . APPENDECTOMY  1980  . BREAST LUMPECTOMY Left    s/p radiation therapy   . BREAST SURGERY    . COLONOSCOPY  07/2015  . TUBAL LIGATION  1991    ROS: Review of Systems  PHYSICAL EXAM: BP 116/76   Pulse (!) 59   Temp 98.7 F (37.1 C) (Oral)   Resp 16   Wt 145 lb 9.6 oz (66 kg)   SpO2 95%   BMI 24.23 kg/m   Physical Exam  {female adult master:310786} {female adult master:310785} Depression screen [facility] 2/9 01/05/2017 10/03/2016 07/10/2016 06/27/2016 04/17/2016  Decreased Interest 2 2 2 3 2   Down, Depressed, Hopeless 1 1 1 2 1   PHQ - 2 Score 3 3 3 5 3   Altered sleeping 3 3 3 2 2   Tired, decreased energy 3 2 3 2 1   Change in appetite 1 2 2 1  0  Feeling bad or failure about yourself  1 1 0 1 1  Trouble concentrating 1 2 1 1  0  Moving slowly or fidgety/restless 1 1 0 1 0  Suicidal thoughts 0 0 0 0 0  PHQ-9 Score 13 14 12 13 7   Some recent data might be hidden   Lab Results  Component Value Date   WBC 5.9 03/23/2016   HGB 11.0 (L) 03/23/2016   HCT 33.0 (L) 03/23/2016   MCV 93.8 03/23/2016   PLT 182 03/23/2016     Chemistry      Component Value Date/Time   NA 142 10/03/2016 0954   K 3.1 (L) 10/03/2016 0954   CL 101 10/03/2016 0954   CO2 27 10/03/2016 0954   BUN 9 10/03/2016 0954   CREATININE 0.87 10/03/2016 0954   CREATININE 0.77  04/17/2016 1104      Component Value Date/Time   CALCIUM 9.7 10/03/2016 0954   ALKPHOS 117 10/03/2016 0954   AST 35 10/03/2016 0954   ALT 28 10/03/2016 0954   BILITOT <0.2 10/03/2016 0954        ASSESSMENT AND PLAN:  There are no diagnoses linked to this encounter. Declined Tramadol Patient was given the opportunity to ask questions.  Patient verbalized understanding of the plan and was able to repeat key elements of the plan.   No orders of the defined types were placed in this encounter.    Requested Prescriptions    No prescriptions requested or ordered in this encounter    No Follow-up on file.  Karle Plumber, MD, FACP

## 2017-01-06 LAB — CBC
Hematocrit: 37.3 % (ref 34.0–46.6)
Hemoglobin: 12.4 g/dL (ref 11.1–15.9)
MCH: 31.3 pg (ref 26.6–33.0)
MCHC: 33.2 g/dL (ref 31.5–35.7)
MCV: 94 fL (ref 79–97)
Platelets: 208 10*3/uL (ref 150–379)
RBC: 3.96 x10E6/uL (ref 3.77–5.28)
RDW: 13.6 % (ref 12.3–15.4)
WBC: 5.7 10*3/uL (ref 3.4–10.8)

## 2017-01-06 LAB — IRON,TIBC AND FERRITIN PANEL
Ferritin: 237 ng/mL — ABNORMAL HIGH (ref 15–150)
Iron Saturation: 23 % (ref 15–55)
Iron: 54 ug/dL (ref 27–159)
Total Iron Binding Capacity: 237 ug/dL — ABNORMAL LOW (ref 250–450)
UIBC: 183 ug/dL (ref 131–425)

## 2017-01-06 LAB — POTASSIUM: Potassium: 3.4 mmol/L — ABNORMAL LOW (ref 3.5–5.2)

## 2017-01-06 NOTE — Progress Notes (Signed)
CC: re-establish   Subjective: Brandy Stevenson is a 61 y.o. female who presents for chronic disease management.  Last saw Dr. Adrian Blackwater 10/2016. Her concerns today include:  Hx of PAD, HTN, HL, tob dep, LT breast CA (lumpectomy and XRT 2009), GERD, COPD, Trigeminal neuralgia RT, dep/anxiety  1. Pain in both arms: She points to both shoulders. -Worse in cold weather and with movement like elevation of arms above head to comb hair. -Right shoulder worse than left.   -Occasional tingling at the fingertips.  No weakness or numbness in the arms -.  Spinal stenosis was also noted at C4-6   2. COPD -down to 5 cig from 1 pk a day. Trying to quit. Stop Wellbutrin. "It made me feel bad and I'm on enough medicines." -Trying to quit on her own. -On Advair and albuterol inhalers  3. Depression: "it all depends on the day." -daughter currently staying with her; she feels crowded. happiest when she is by herself. Children going through somethings that are depressing to her. -likes to walk but painful callouses on RT foot prevents her from being able to do so. Also limited by pain in both calf from PAD. can not walk 1 block without stopping due to pain in calf. Better walking down hill  4. HTN: compliant with lisinopril/HCTZ Patient Active Problem List   Diagnosis Date Noted  . Cracked skin on feet 06/27/2016  . Hiatal hernia 05/01/2016  . DJD (degenerative joint disease) of cervical spine 04/17/2016  . History of breast cancer 02/18/2016  . Foot callus 02/18/2016  . Hypokalemia 11/23/2015  . Anxiety and depression 05/22/2015  . Insomnia 03/15/2015  . Hamstring tightness of both lower extremities 04/04/2014  . Unspecified vitamin D deficiency 04/08/2013  . Other and unspecified hyperlipidemia 04/08/2013  . Bilateral leg pain 03/22/2013  . Atherosclerotic PVD with intermittent claudication ([place]) 03/22/2013  . Essential hypertension, benign 02/23/2013  . GERD (gastroesophageal reflux disease)  02/23/2013  . COPD (chronic obstructive pulmonary disease) ([place]) 02/23/2013  . Smoking 02/23/2013  . History of lumpectomy 02/23/2013     Current Outpatient Medications on File Prior to Visit  Medication Sig Dispense Refill  . albuterol (PROVENTIL HFA;VENTOLIN HFA) 108 (90 Base) MCG/ACT inhaler Inhale 2 puffs into the lungs every 6 (six) hours as needed for wheezing. 3 Inhaler 3  . aspirin EC 81 MG tablet Take 1 tablet (81 mg total) by mouth daily. 90 tablet 3  . atorvastatin (LIPITOR) 20 MG tablet Take 1 tablet (20 mg total) by mouth daily. 90 tablet 3  . buPROPion (WELLBUTRIN SR) 150 MG 12 hr tablet Take 1 tablet (150 mg total) by mouth 2 (two) times daily. 180 tablet 3  . diphenhydrAMINE (BENADRYL) 50 MG capsule Take 1 capsule (50 mg total) by mouth at bedtime as needed. 30 capsule 2  . Fluticasone-Salmeterol (ADVAIR) 100-50 MCG/DOSE AEPB Inhale 1 puff into the lungs 2 (two) times daily. 180 each 3  . gabapentin (NEURONTIN) 300 MG capsule Take by mouth 300 mg in the day as needed for pain and 300 mg at night 180 capsule 3  . lisinopril-hydrochlorothiazide (ZESTORETIC) 20-12.5 MG tablet Take 1 tablet by mouth daily. 90 tablet 3  . naproxen (NAPROSYN) 500 MG tablet Take 1 tablet (500 mg total) by mouth 2 (two) times daily with a meal. 60 tablet 5  . omeprazole (PRILOSEC) 40 MG capsule Take 1 capsule (40 mg total) by mouth daily. 30 capsule 5  . potassium chloride SA (K-DUR,KLOR-CON) 20 MEQ tablet Take 1  tablet (20 mEq total) by mouth daily. 90 tablet 3   No current facility-administered medications on file prior to visit.     Allergies  Allergen Reactions  . Vicodin [Hydrocodone-Acetaminophen] Nausea And Vomiting and Other (See Comments)    Sweating    Social History   Socioeconomic History  . Marital status: Married    Spouse name: Not on file  . Number of children: Not on file  . Years of education: Not on file  . Highest education level: Not on file  Social Needs  .  Financial resource strain: Not on file  . Food insecurity - worry: Not on file  . Food insecurity - inability: Not on file  . Transportation needs - medical: Not on file  . Transportation needs - non-medical: Not on file  Occupational History  . Not on file  Tobacco Use  . Smoking status: Light Tobacco Smoker    Packs/day: 0.25    Years: 40.00    Pack years: 10.00    Types: Cigarettes  . Smokeless tobacco: Never Used  . Tobacco comment: 3 cigarettes per day  Substance and Sexual Activity  . Alcohol use: No  . Drug use: No  . Sexual activity: Not Currently  Other Topics Concern  . Not on file  Social History Narrative  . Not on file    Family History  Problem Relation Age of Onset  . Hyperlipidemia Mother   . Hypertension Mother   . Heart disease Mother        Atrial Fib.  . Hyperlipidemia Sister   . Hypertension Sister   . Heart disease Sister        Before age 44,  CHF  . Heart attack Brother   . Hyperlipidemia Brother   . Hypertension Brother   . Hypertension Daughter     Past Surgical History:  Procedure Laterality Date  . APPENDECTOMY  1980  . BREAST LUMPECTOMY Left    s/p radiation therapy   . BREAST SURGERY    . COLONOSCOPY  07/2015  . TUBAL LIGATION  1991    ROS: Review of Systems Negative except as stated above PHYSICAL EXAM: BP 116/76   Pulse (!) 59   Temp 98.7 F (37.1 C) (Oral)   Resp 16   Wt 145 lb 9.6 oz (66 kg)   SpO2 95%   BMI 24.23 kg/m   Physical Exam General appearance - alert, well appearing, and in no distress Mental status - alert, oriented to person, place, and time, normal mood, behavior, speech, dress, motor activity, and thought processes Mouth - mucous membranes moist, pharynx normal without lesions Neck - supple, no significant adenopathy Chest -scattered expiratory wheezes  heart - normal rate, regular rhythm, normal S1, S2, no murmurs, rubs, clicks or gallops Musculoskeletal -right shoulder no point tenderness.  Mild  discomfort with passive movement in all directions drop arm test is negative. Left shoulder -no point tenderness.  Mild discomfort with passive range of motion in all direction.  Drop arm test is negative. Extremities -no lower extremity edema Skin: Toenails are thickened, discolored and overgrown.  Hyperpigmented small callus on the soles of right foot.  They are both tender to touch Skin no soles of the feet especially on the heels are dried and cracked Skin on both lower extremities very dry with the appearance of fish scales Depression screen [facility] 2/9 01/05/2017 10/03/2016 07/10/2016 06/27/2016 04/17/2016  Decreased Interest 2 2 2 3 2   Down, Depressed, Hopeless 1 1 1  2 1  PHQ - 2 Score 3 3 3 5 3   Altered sleeping 3 3 3 2 2   Tired, decreased energy 3 2 3 2 1   Change in appetite 1 2 2 1  0  Feeling bad or failure about yourself  1 1 0 1 1  Trouble concentrating 1 2 1 1  0  Moving slowly or fidgety/restless 1 1 0 1 0  Suicidal thoughts 0 0 0 0 0  PHQ-9 Score 13 14 12 13 7   Some recent data might be hidden   Lab Results  Component Value Date   WBC 5.9 03/23/2016   HGB 11.0 (L) 03/23/2016   HCT 33.0 (L) 03/23/2016   MCV 93.8 03/23/2016   PLT 182 03/23/2016     Chemistry      Component Value Date/Time   NA 142 10/03/2016 0954   K 3.1 (L) 10/03/2016 0954   CL 101 10/03/2016 0954   CO2 27 10/03/2016 0954   BUN 9 10/03/2016 0954   CREATININE 0.87 10/03/2016 0954   CREATININE 0.77 04/17/2016 1104      Component Value Date/Time   CALCIUM 9.7 10/03/2016 0954   ALKPHOS 117 10/03/2016 0954   AST 35 10/03/2016 0954   ALT 28 10/03/2016 0954   BILITOT <0.2 10/03/2016 0954        ASSESSMENT AND PLAN: 1. Chronic pain of both shoulders -possible OA vs coming from neck.  -will image shoulders -Recommend trail of Tramadol. Pt declined stating narcotics in the past have been too strong for her. She also declined changing from Naprosyn to Mobic - DG Shoulder Right; Future - DG Shoulder  Left; Future  2. Essential hypertension -at goal. Cont Lisin/HCTZ. Recheck K+ - Potassium  3. Tobacco dependence Commended her on trying to quit. Encouraged her to set a quit date. Also encouraged her to try nicotine replacement therapy.  Patient can call 1 [phone] quit now.  Wellbutrin removed from med list  4. PAD (peripheral artery disease) ([place]) -she is followed by vascular.   5. Overgrown toenails - Ambulatory referral to Podiatry  6. Callous ulcer, unspecified ulcer stage ([place]) - Ambulatory referral to Podiatry  7. Anxiety and depression -pt declined any medication or referral to therapist at this time  8. Anemia, unspecified type - CBC - Iron, TIBC and Ferritin Panel  Patient was given the opportunity to ask questions.  Patient verbalized understanding of the plan and was able to repeat key elements of the plan.   No orders of the defined types were placed in this encounter.    Requested Prescriptions    No prescriptions requested or ordered in this encounter    No Follow-up on file.  Karle Plumber, MD, FACP

## 2017-01-07 ENCOUNTER — Ambulatory Visit (HOSPITAL_COMMUNITY)
Admission: RE | Admit: 2017-01-07 | Discharge: 2017-01-07 | Disposition: A | Discharge status: Home / Self Care | Payer: Medicare HMO | Source: Ambulatory Visit | Attending: Internal Medicine | Admitting: Internal Medicine

## 2017-01-07 DIAGNOSIS — M19012 Primary osteoarthritis, left shoulder: Secondary | ICD-10-CM | POA: Diagnosis not present

## 2017-01-07 DIAGNOSIS — M25512 Pain in left shoulder: Secondary | ICD-10-CM | POA: Insufficient documentation

## 2017-01-07 DIAGNOSIS — M19011 Primary osteoarthritis, right shoulder: Secondary | ICD-10-CM | POA: Diagnosis not present

## 2017-01-07 DIAGNOSIS — M25511 Pain in right shoulder: Secondary | ICD-10-CM | POA: Diagnosis not present

## 2017-01-07 DIAGNOSIS — G8929 Other chronic pain: Secondary | ICD-10-CM | POA: Diagnosis not present

## 2017-01-09 ENCOUNTER — Telehealth: Payer: Self-pay | Admitting: Internal Medicine

## 2017-01-09 DIAGNOSIS — J418 Mixed simple and mucopurulent chronic bronchitis: Secondary | ICD-10-CM

## 2017-01-09 MED ORDER — ALBUTEROL SULFATE HFA 108 (90 BASE) MCG/ACT IN AERS: 2.0000 | INHALATION_SPRAY | Freq: Four times a day (QID) | RESPIRATORY_TRACT | 4 refills | Status: DC | PRN

## 2017-01-09 MED ORDER — TRAMADOL HCL 50 MG PO TABS: 50.0000 mg | ORAL_TABLET | Freq: Two times a day (BID) | ORAL | 1 refills | Status: DC

## 2017-01-09 NOTE — Telephone Encounter (Signed)
Phone call placed to patient this a.m. to go over the results of lab tests and x-rays.  Patient informed that the x-rays of the shoulder revealed mild degenerative arthritis changes on the right.  Left was normal.  Patient reports that the Naprosyn that she has taken for some time is really not controlling the pain in her neck and shoulders.  We had discussed trying her with tramadol on last visit but she had declined.  She is now willing to give it a try.  I went over possible side effects of the medication including constipation and drowsiness.  Patient advised that it is a narcotic medication and also can become habit forming.  Advised to take only when needed.  Patient expressed understanding.  Prescription sent to pharmacy. [facility] reviewed and is appropriate.   Blood test showed normal CBC.  Potassium level has improved but still low at 3.4.  She admits that she was not taking the potassium supplement every day because of the size of the pills.  We discussed changing to liquid formula.  Patient declined stating that she will take the pills every day.  She is on lisinopril HCTZ.  Patient requested refill on albuterol inhaler to be sent to [facility].

## 2017-01-14 ENCOUNTER — Encounter: Payer: Medicare HMO | Admitting: Podiatry

## 2017-01-21 NOTE — Progress Notes (Signed)
This encounter was created in error - please disregard.

## 2017-02-02 ENCOUNTER — Emergency Department (HOSPITAL_COMMUNITY): Payer: Medicare HMO

## 2017-02-02 ENCOUNTER — Emergency Department (HOSPITAL_COMMUNITY)
Admission: EM | Admit: 2017-02-02 | Discharge: 2017-02-02 | Disposition: A | Discharge status: Home / Self Care | Payer: Medicare HMO | Attending: Emergency Medicine | Admitting: Emergency Medicine

## 2017-02-02 ENCOUNTER — Other Ambulatory Visit: Payer: Self-pay

## 2017-02-02 ENCOUNTER — Ambulatory Visit (HOSPITAL_COMMUNITY): Payer: Medicare HMO

## 2017-02-02 ENCOUNTER — Encounter (HOSPITAL_COMMUNITY): Payer: Self-pay | Admitting: Emergency Medicine

## 2017-02-02 DIAGNOSIS — R0789 Other chest pain: Secondary | ICD-10-CM | POA: Insufficient documentation

## 2017-02-02 DIAGNOSIS — I2510 Atherosclerotic heart disease of native coronary artery without angina pectoris: Secondary | ICD-10-CM | POA: Diagnosis not present

## 2017-02-02 DIAGNOSIS — F329 Major depressive disorder, single episode, unspecified: Secondary | ICD-10-CM | POA: Diagnosis not present

## 2017-02-02 DIAGNOSIS — Z7982 Long term (current) use of aspirin: Secondary | ICD-10-CM | POA: Diagnosis not present

## 2017-02-02 DIAGNOSIS — Z79899 Other long term (current) drug therapy: Secondary | ICD-10-CM | POA: Diagnosis not present

## 2017-02-02 DIAGNOSIS — I10 Essential (primary) hypertension: Secondary | ICD-10-CM | POA: Insufficient documentation

## 2017-02-02 DIAGNOSIS — Z853 Personal history of malignant neoplasm of breast: Secondary | ICD-10-CM | POA: Insufficient documentation

## 2017-02-02 DIAGNOSIS — F17210 Nicotine dependence, cigarettes, uncomplicated: Secondary | ICD-10-CM | POA: Diagnosis not present

## 2017-02-02 DIAGNOSIS — F419 Anxiety disorder, unspecified: Secondary | ICD-10-CM | POA: Insufficient documentation

## 2017-02-02 DIAGNOSIS — R05 Cough: Secondary | ICD-10-CM | POA: Diagnosis present

## 2017-02-02 DIAGNOSIS — R0781 Pleurodynia: Secondary | ICD-10-CM | POA: Diagnosis not present

## 2017-02-02 DIAGNOSIS — J449 Chronic obstructive pulmonary disease, unspecified: Secondary | ICD-10-CM | POA: Diagnosis not present

## 2017-02-02 MED ORDER — DICLOFENAC SODIUM 75 MG PO TBEC: 75.0000 mg | DELAYED_RELEASE_TABLET | Freq: Two times a day (BID) | ORAL | 0 refills | Status: DC

## 2017-02-02 NOTE — ED Triage Notes (Signed)
Pt c/o cough and states "feels like something is moving" in L lower ribs. Pt states it is only painful with deep cough. States cough is non-productive.

## 2017-02-02 NOTE — ED Notes (Addendum)
Pt states that when she has a deep cough she has pain to her left side rib pain, pt states she feels that she something is moving. Pt is alert and oriented x 4 and is verbally responsive. Pt denies pain at rest. Pt is breathing WNL .Pt does report that she has some blocked artery in her abdomen area, per pt.

## 2017-02-02 NOTE — Discharge Instructions (Signed)
See your Physician for recheck.  °

## 2017-02-03 NOTE — ED Provider Notes (Signed)
[facility] DEPT Provider Note   CSN: [id number] Arrival date & time: 02/02/17  1309     History   Chief Complaint Chief Complaint  Patient presents with  . Cough  . L rib pain    HPI Brandy Stevenson is a 61 y.o. female.  The history is provided by the patient. No language interpreter was used.  Cough  This is a new problem. The current episode started 2 days ago. The problem occurs constantly. The problem has been gradually worsening. The cough is non-productive. There has been no fever. The treatment provided moderate relief. She is not a smoker. Her past medical history does not include pneumonia.  Pt complains of pain with taking a deep breath.  Pt reports left ribs feel sore.  Pt reports yesterday and today she had a pain shot down her left arm.    Past Medical History:  Diagnosis Date  . Arthritis    back problems to be evaluated by neurology next month  . Cancer ([place]) 2009   L, Breast, radiation and surgery.  [name] COPD (chronic obstructive pulmonary disease) ([place])   . Depression   . GERD (gastroesophageal reflux disease)   . Hyperlipemia   . Hypertension   . Peripheral vascular disease ([place]) 2013    Patient Active Problem List   Diagnosis Date Noted  . Cracked skin on feet 06/27/2016  . Hiatal hernia 05/01/2016  . DJD (degenerative joint disease) of cervical spine 04/17/2016  . History of breast cancer 02/18/2016  . Foot callus 02/18/2016  . Anxiety and depression 05/22/2015  . Insomnia 03/15/2015  . Hamstring tightness of both lower extremities 04/04/2014  . Unspecified vitamin D deficiency 04/08/2013  . Other and unspecified hyperlipidemia 04/08/2013  . Bilateral leg pain 03/22/2013  . Atherosclerotic PVD with intermittent claudication ([place]) 03/22/2013  . Essential hypertension, benign 02/23/2013  . GERD (gastroesophageal reflux disease) 02/23/2013  . COPD (chronic obstructive pulmonary disease) ([place]) 02/23/2013  . Smoking  02/23/2013    Past Surgical History:  Procedure Laterality Date  . APPENDECTOMY  1980  . BREAST LUMPECTOMY Left    s/p radiation therapy   . BREAST SURGERY    . COLONOSCOPY  07/2015  . COLONOSCOPY WITH PROPOFOL N/A 06/26/2015   Procedure: COLONOSCOPY WITH PROPOFOL;  Surgeon: [name], MD;  Location: [facility];  Service: Endoscopy;  Laterality: N/A;  . PERIPHERAL VASCULAR CATHETERIZATION N/A 10/22/2015   Procedure: Abdominal Aortogram w/Lower Extremity;  Surgeon: [name], MD;  Location: [facility];  Service: Cardiovascular;  Laterality: N/A;  . TUBAL LIGATION  1991    OB History    No data available       Home Medications    Prior to Admission medications   Medication Sig Start Date End Date Taking? Authorizing Provider  albuterol (PROVENTIL HFA;VENTOLIN HFA) 108 (90 Base) MCG/ACT inhaler Inhale 2 puffs every 6 (six) hours as needed into the lungs for wheezing. 01/09/17   [name], MD  aspirin EC 81 MG tablet Take 1 tablet (81 mg total) by mouth daily. 10/03/16   Funches, Adriana Mccallum, MD  atorvastatin (LIPITOR) 20 MG tablet Take 1 tablet (20 mg total) by mouth daily. 10/03/16   Funches, Adriana Mccallum, MD  diclofenac (VOLTAREN) 75 MG EC tablet Take 1 tablet (75 mg total) by mouth 2 (two) times daily. 02/02/17   Fransico Meadow, PA-C  diphenhydrAMINE (BENADRYL) 50 MG capsule Take 1 capsule (50 mg total) by mouth at bedtime as needed.  05/08/16   Funches, Adriana Mccallum, MD  Fluticasone-Salmeterol (ADVAIR) 100-50 MCG/DOSE AEPB Inhale 1 puff into the lungs 2 (two) times daily. 10/03/16   Boykin Nearing, MD  gabapentin (NEURONTIN) 300 MG capsule Take by mouth 300 mg in the day as needed for pain and 300 mg at night 10/03/16   Funches, Josalyn, MD  lisinopril-hydrochlorothiazide (ZESTORETIC) 20-12.5 MG tablet Take 1 tablet by mouth daily. 10/03/16   Funches, Adriana Mccallum, MD  naproxen (NAPROSYN) 500 MG tablet Take 1 tablet (500 mg total) by mouth 2 (two) times daily with a meal.  04/17/16   Funches, Josalyn, MD  omeprazole (PRILOSEC) 40 MG capsule Take 1 capsule (40 mg total) by mouth daily. 10/03/16   Funches, Adriana Mccallum, MD  potassium chloride SA (K-DUR,KLOR-CON) 20 MEQ tablet Take 1 tablet (20 mEq total) by mouth daily. 10/03/16   Funches, Adriana Mccallum, MD  traMADol (ULTRAM) 50 MG tablet Take 1 tablet (50 mg total) 2 (two) times daily by mouth. 01/09/17   [name], MD    Family History Family History  Problem Relation Age of Onset  . Hyperlipidemia Mother   . Hypertension Mother   . Heart disease Mother        Atrial Fib.  . Hyperlipidemia Sister   . Hypertension Sister   . Heart disease Sister        Before age 26,  CHF  . Heart attack Brother   . Hyperlipidemia Brother   . Hypertension Brother   . Hypertension Daughter     Social History Social History   Tobacco Use  . Smoking status: Light Tobacco Smoker    Packs/day: 0.25    Years: 40.00    Pack years: 10.00    Types: Cigarettes  . Smokeless tobacco: Never Used  . Tobacco comment: 3 cigarettes per day  Substance Use Topics  . Alcohol use: No  . Drug use: No     Allergies   Vicodin [hydrocodone-acetaminophen] and Wellbutrin [bupropion]   Review of Systems Review of Systems  Respiratory: Positive for cough.   All other systems reviewed and are negative.    Physical Exam Updated Vital Signs BP (!) 148/98 (BP Location: Left Arm)   Pulse 78   Temp 98.9 F (37.2 C) (Oral)   Resp 20   Ht 5\' 4"  (1.626 m)   Wt 67.1 kg (148 lb)   SpO2 100%   BMI 25.40 kg/m   Physical Exam  Constitutional: She is oriented to person, place, and time. She appears well-developed and well-nourished.  HENT:  Head: Normocephalic.  Right Ear: External ear normal.  Left Ear: External ear normal.  Eyes: EOM are normal. Pupils are equal, round, and reactive to light.  Neck: Normal range of motion.  Cardiovascular: Normal rate and regular rhythm.  Pulmonary/Chest: Effort normal and breath sounds normal.    Abdominal: Soft. She exhibits no distension.  Musculoskeletal: Normal range of motion.  Neurological: She is alert and oriented to person, place, and time.  Skin: Skin is warm.  Psychiatric: She has a normal mood and affect.  Nursing note and vitals reviewed.    ED Treatments / Results  Labs (all labs ordered are listed, but only abnormal results are displayed) Labs Reviewed - No data to display  EKG  EKG Interpretation  Date/Time:  Monday February 02 2017 21:20:00 EST Ventricular Rate:  54 PR Interval:    QRS Duration: 94 QT Interval:  460 QTC Calculation: 436 R Axis:   25 Text Interpretation:  Sinus rhythm no  acute ST/T changes no significant change since 2017 Confirmed by Sherwood Gambler [phone]) on 02/02/2017 9:35:33 PM Also confirmed by Sherwood Gambler [phone]), editor Hattie Perch [phone])  on 02/03/2017 7:34:31 AM       Radiology Dg Ribs Unilateral W/chest Left  Result Date: 02/02/2017 CLINICAL DATA:  Left rib pain.  Cough for several weeks. EXAM: LEFT RIBS AND CHEST - 3+ VIEW COMPARISON:  Chest x-ray 03/23/2016 FINDINGS: No fracture or other bone lesions are seen involving the ribs. There is no evidence of pneumothorax or pleural effusion. Both lungs are clear. Heart size and mediastinal contours are within normal limits. Aortic atherosclerosis. IMPRESSION: No acute abnormality. Aortic atherosclerosis. Electronically Signed   By: Lorriane Shire M.D.   On: 02/02/2017 15:45    Procedures Procedures (including critical care time)  Medications Ordered in ED Medications - No data to display   Initial Impression / Assessment and Plan / ED Course  I have reviewed the triage vital signs and the nursing notes.  Pertinent labs & imaging results that were available during my care of the patient were reviewed by me and considered in my medical decision making (see chart for details).     Pt advised to follow up with her MD.  Pt is currently pain free.   I suspect  muscular pain.    Final Clinical Impressions(s) / ED Diagnoses   Final diagnoses:  Chest wall pain    ED Discharge Orders        Ordered    diclofenac (VOLTAREN) 75 MG EC tablet  2 times daily     02/02/17 2147    An After Visit Summary was printed and given to the patient.    Fransico Meadow, PA-C 02/03/17 [place], MD 02/03/17 [phone]

## 2017-02-04 ENCOUNTER — Ambulatory Visit (HOSPITAL_COMMUNITY)
Admission: RE | Admit: 2017-02-04 | Discharge: 2017-02-04 | Disposition: A | Discharge status: Home / Self Care | Payer: Medicare HMO | Source: Ambulatory Visit | Attending: Family | Admitting: Family

## 2017-02-04 ENCOUNTER — Ambulatory Visit (INDEPENDENT_AMBULATORY_CARE_PROVIDER_SITE_OTHER): Payer: Medicare HMO | Admitting: Family

## 2017-02-04 ENCOUNTER — Encounter (HOSPITAL_COMMUNITY): Payer: Self-pay

## 2017-02-04 ENCOUNTER — Encounter: Payer: Self-pay | Admitting: Family

## 2017-02-04 VITALS — BP 124/81 | HR 62 | Temp 97.1°F | Resp 18 | Wt 145.3 lb

## 2017-02-04 DIAGNOSIS — I7409 Other arterial embolism and thrombosis of abdominal aorta: Secondary | ICD-10-CM

## 2017-02-04 DIAGNOSIS — I70213 Atherosclerosis of native arteries of extremities with intermittent claudication, bilateral legs: Secondary | ICD-10-CM | POA: Diagnosis not present

## 2017-02-04 DIAGNOSIS — Z87891 Personal history of nicotine dependence: Secondary | ICD-10-CM

## 2017-02-04 DIAGNOSIS — I739 Peripheral vascular disease, unspecified: Secondary | ICD-10-CM

## 2017-02-04 DIAGNOSIS — Z7722 Contact with and (suspected) exposure to environmental tobacco smoke (acute) (chronic): Secondary | ICD-10-CM | POA: Diagnosis not present

## 2017-02-04 DIAGNOSIS — F17200 Nicotine dependence, unspecified, uncomplicated: Secondary | ICD-10-CM

## 2017-02-04 NOTE — Progress Notes (Signed)
[facility]   CC: Follow up peripheral artery occlusive disease  History of Present Illness Brandy Stevenson is a 61 y.o. female who returns for follow up of bilateral lower extremity PAD and known iliac artery stenosis.  She has no history of nonhealing ulcers or infection.   Dr. Scot Dock performed an arteriogram on 10/22/2015. This showed that her infrarenal aorta was occluded below the inferior mesenteric artery. There was reconstitution of the external iliac arteries bilaterally. She had no significant disease of the aortic arch or right or left subclavian arteries.  Dr. Scot Dock saw pt on 11-22-15. At that time she hadstable claudication. She was very close to getting off cigarettes completely and Dr. Raynelle Fanning her to try to get off cigarettes completely, and discussed astructured walking program andthe importance of nutrition. Hopefully the [facility] vascular rehabilitation program will be up and running soon, Dr. Zonia Kief her name to the list; Dr. Scot Dock indicates thatshe would be an excellent candidate for this. She seems very motivated. If her symptoms progressed then Dr. Scot Dock indicates she would be a candidate for aortofemoral bypass grafting. She has had previous abdominal surgery. Dr. Scot Dock advised pt to return in 6 months withABIs.   She has multiple complaints today: depression, anxiety, arthritis pain in her neck and shoulders, calluses on her feet, and a catch feeling on her left upper flank that was evaluated in the ED on 02-02-17. In regards to claudication: walking about 100 feet both calves feel tight, relieved by rest.   She denies non healing wounds but does have dry skin cracking at backs of both heels, no fissures. Her feet "ball up" at night.   She has a history of left breast lumpectomy for breast cancer.  She denies any history of stroke or TIA, denies hx of MI or heart disease. She states she has back pain with  bending over and the same tired feeling in legs with bending at the waist. Her legs give way at times.  She was under evaluation of her back which was halted when left breast cancer found in 2009, but was evaluated by Dr. Vertell Limber for this in May 2017. Pt states he told her that she has arthritis in her neck and minor lumbar spine issues, not enough to cause the bilateral calf, thigh, and hips pain, at it's worstwhen she makes her bed, also present when she lies supine. She states many years ago she was told that she had a deteriorating disc in her back. She was also diagnosed a few years back with osteoporosis.   Her PCP is [facility].  Gabapentin has helped pain in her legs.   Pt Diabetic: A1C was 5.9 in January 2017 (review of records) (pre-diabetes). She has a sign of insulin resistance: acanthosis nigricans encircling her neck Pt smoker: Former smoker, last cigarette was 02-01-17, was about 5cigarettes/day or less, (40+ yrs). She has been taking Wellbutrin since about June 2017 and has decreased her smoking. She expresses a strong desire to stop smoking. Several family members smoke in her house.  Pt meds include: Statin :yes ASA: Yes Other anticoagulants/antiplatelets: no    Past Medical History:  Diagnosis Date  . Arthritis    back problems to be evaluated by neurology next month  . Cancer ([place]) 2009   L, Breast, radiation and surgery.  [name] COPD (chronic obstructive pulmonary disease) ([place])   . Depression   . GERD (gastroesophageal reflux disease)   . Hyperlipemia   . Hypertension   .  Peripheral vascular disease ([place]) 2013    Social History Social History   Tobacco Use  . Smoking status: Light Tobacco Smoker    Packs/day: 0.25    Years: 40.00    Pack years: 10.00    Types: Cigarettes  . Smokeless tobacco: Never Used  . Tobacco comment: 3 cigarettes per day  Substance Use Topics  . Alcohol use: No  . Drug use: No    Family History Family History   Problem Relation Age of Onset  . Hyperlipidemia Mother   . Hypertension Mother   . Heart disease Mother        Atrial Fib.  . Hyperlipidemia Sister   . Hypertension Sister   . Heart disease Sister        Before age 79,  CHF  . Heart attack Brother   . Hyperlipidemia Brother   . Hypertension Brother   . Heart disease Brother   . Hypertension Daughter     Past Surgical History:  Procedure Laterality Date  . APPENDECTOMY  1980  . BREAST LUMPECTOMY Left    s/p radiation therapy   . BREAST SURGERY    . COLONOSCOPY  07/2015  . COLONOSCOPY WITH PROPOFOL N/A 06/26/2015   Procedure: COLONOSCOPY WITH PROPOFOL;  Surgeon: [name], MD;  Location: [facility];  Service: Endoscopy;  Laterality: N/A;  . PERIPHERAL VASCULAR CATHETERIZATION N/A 10/22/2015   Procedure: Abdominal Aortogram w/Lower Extremity;  Surgeon: [name], MD;  Location: [facility];  Service: Cardiovascular;  Laterality: N/A;  . TUBAL LIGATION  1991    Allergies  Allergen Reactions  . Vicodin [Hydrocodone-Acetaminophen] Nausea And Vomiting and Other (See Comments)    Sweating  . Wellbutrin [Bupropion]     Made me feel bad    Current Outpatient Medications  Medication Sig Dispense Refill  . albuterol (PROVENTIL HFA;VENTOLIN HFA) 108 (90 Base) MCG/ACT inhaler Inhale 2 puffs every 6 (six) hours as needed into the lungs for wheezing. 3 Inhaler 4  . aspirin EC 81 MG tablet Take 1 tablet (81 mg total) by mouth daily. 90 tablet 3  . atorvastatin (LIPITOR) 20 MG tablet Take 1 tablet (20 mg total) by mouth daily. 90 tablet 3  . diphenhydrAMINE (BENADRYL) 50 MG capsule Take 1 capsule (50 mg total) by mouth at bedtime as needed. 30 capsule 2  . Fluticasone-Salmeterol (ADVAIR) 100-50 MCG/DOSE AEPB Inhale 1 puff into the lungs 2 (two) times daily. 180 each 3  . gabapentin (NEURONTIN) 300 MG capsule Take by mouth 300 mg in the day as needed for pain and 300 mg at night 180 capsule 3  .  lisinopril-hydrochlorothiazide (ZESTORETIC) 20-12.5 MG tablet Take 1 tablet by mouth daily. 90 tablet 3  . naproxen (NAPROSYN) 500 MG tablet Take 1 tablet (500 mg total) by mouth 2 (two) times daily with a meal. 60 tablet 5  . omeprazole (PRILOSEC) 40 MG capsule Take 1 capsule (40 mg total) by mouth daily. 30 capsule 5  . potassium chloride SA (K-DUR,KLOR-CON) 20 MEQ tablet Take 1 tablet (20 mEq total) by mouth daily. 90 tablet 3  . traMADol (ULTRAM) 50 MG tablet Take 1 tablet (50 mg total) 2 (two) times daily by mouth. 60 tablet 1  . diclofenac (VOLTAREN) 75 MG EC tablet Take 1 tablet (75 mg total) by mouth 2 (two) times daily. (Patient not taking: Reported on 02/04/2017) 20 tablet 0   No current facility-administered medications for this visit.     ROS: See HPI for pertinent  positives and negatives.   Physical Examination  Vitals:   02/04/17 0919  BP: 124/81  Pulse: 62  Resp: 18  Temp: (!) 97.1 F (36.2 C)  TempSrc: Oral  SpO2: 97%  Weight: 145 lb 4.8 oz (65.9 kg)   Body mass index is 24.94 kg/m.  General: A&O x 3, WDWN. Neck:  mild acanthosis nigricans Gait: normal Eyes: PERRLA. Pulmonary: Respirations are non labored, fair air movement in all fields, CTAB, without wheezes, rales,or rhonchi. Cardiac: regular rhythm and rate, no detected murmur.     Carotid Bruits Right Left   Negative Negative   Abdominal aortic pulse is not palpable. Radial pulses: 2+ palpable and =   VASCULAR EXAM: Extremitieswithout ischemic changes  without Gangrene; without open wounds. Dry cracking skin at posterior aspect of heels, no fissures. Plantar warts.      LE Pulses Right Left   FEMORAL Not palpable 1+ palpable    POPLITEAL not palpable  not palpable   POSTERIOR  TIBIAL not palpable  not palpable    DORSALIS PEDIS  ANTERIOR TIBIAL not palpable  not palpable    Abdomen: soft, NT, no palpable masses. Skin: no rashes, no ulcers. Small consolidated abscess below left clavicle. See extremities.  Musculoskeletal: no muscle wasting or atrophy. Neurologic: A&O X 3; moderately anxious affect; SENSATION: normal; MOTOR FUNCTION: moving all extremities equally, motor strength 4/5 in UE's, 5/5 in LE's. Speech is fluent/normal. CN 2-12 grossly intact.       ASSESSMENT: Brandy Stevenson is a 61 y.o. female presents with PAD, known aortoiliac artery disease,has no signs of ischemia in her feet/legs.  Dr. Scot Dock performed an arteriogram on 10/22/2015. This showed that her infrarenal aorta was occluded below the inferior mesenteric artery. There was reconstitution of the external iliac arteries bilaterally. She had no significant disease of the aortic arch or right or left subclavian arteries.  She has been trying to walk more, both calves hurt after walking about 100 feet, this has worsened from 200 feet, relieved by rest.  Her left femoral pulse is 1+ palpable, right femoral and pedal pulses are not palpable. There are no signs of ischemia in her feet or legs.   She was evaluated by Dr. Vertell Limber and he determined that the lumber spine issues she has are minor, not severe enough to cause the symptoms in her legs, but her c-spine issues are more important.  She has painful plantar warts that she has been self treating, states she cannot afford the co-pay to see a podiatrist.   She denies a known diagnosis of DM, she does have a sign of insulin resistance: acanthosis nigricans. Fortunately she has not smoked x 3 days but family members in her home smoke in her house. I advised her to tell them to not smoke in their house since secondhand smoke is harmful to pt.   I discussed pt HPI with Dr. Scot Dock, he reviewed her August 2017  arteriogram. He has not seen her for over a year, he will need to see her as a new pt office visit to discuss possible lower extremity revascularization surgery.    DATA  None today, as advised by Dr. Scot Dock  ABI (Date: 10/01/2016):  R:  ? ABI: 0.58 (was 0.60 on 05-28-16),  ? PT: mono ? DP: mono ? TBI:  0.45 (was 0.47)  L:  ? ABI: 0.54 (was 0.57),  ? PT: mono ? DP: mono ? TBI: 0.43 (was 0.42) ? Stable bilateral ABI with moderate arterial occlusive disease.  10/28/13 CT abd/pelvis w/o contrast requested by Dr. Annitta Needs for hematuria, right flank pain: Densely calcified atherosclerotic plaque extends along the abdominal aorta into the iliac arteries. This is advanced for age.  07/16/15 MR Lumbar spine w/o contrast requested by Dr. Vertell Limber: IMPRESSION: Mild lumbar degenerative change. Normal alignment Small right foraminal disc protrusion L3-4 with associated disc Bulging.    PLAN:  Based on the patient's vascular studies and examination, pt will return to clinic at Dr. Nicole Cella next office availability to work up as new pt, to discuss possible aortobifemoral bypass graft.    I discussed in depth with the patient the nature of atherosclerosis, and emphasized the importance of maximal medical management including strict control of blood pressure, blood glucose, and lipid levels, obtaining regular exercise, and cessation of smoking.  The patient is aware that without maximal medical management the underlying atherosclerotic disease process will progress, limiting the benefit of any interventions.  The patient was given information about PAD including signs, symptoms, treatment, what symptoms should prompt the patient to seek immediate medical care, and risk reduction measures to take.  Clemon Chambers, RN, MSN, FNP-C [facility] Phone: [phone]  Clinic MD: Scot Dock  02/04/17 9:41 AM

## 2017-02-04 NOTE — Patient Instructions (Signed)
Secondhand Smoke What is secondhand smoke? Secondhand smoke is smoke that comes from burning tobacco. It could be the smoke from a cigarette, a pipe, or a cigar. Even if you are not the one smoking, secondhand smoke exposes you to the dangers of smoking. This is called involuntary, or passive, smoking. There are two types of secondhand smoke:  Sidestream smoke is the smoke that comes off the lighted end of a cigarette, pipe, or cigar. ? This type of smoke has the highest amount of cancer-causing agents (carcinogens). ? The particles in sidestream smoke are smaller. They get into your lungs more easily.  Mainstream smoke is the smoke that is exhaled by a person who is smoking. ? This type of smoke is also dangerous to your health.  How can secondhand smoke affect my health? Studies show that there is no safe level of secondhand smoke. This smoke contains thousands of chemicals. At least 69 of them are known to cause cancer. Secondhand smoke can also cause many other health problems. It has been linked to:  Lung cancer.  Cancer of the voice box (larynx) or throat.  Cancer of the sinuses.  Brain cancer.  Bladder cancer.  Stomach cancer.  Breast cancer.  White blood cell cancers (lymphoma and leukemia).  Brain and liver tumors in children.  Heart disease and stroke in adults.  Pregnancy loss (miscarriage).  Diseases in children, such as: ? Asthma. ? Lung infections. ? Ear infections. ? Sudden infant death syndrome (SIDS). ? Slow growth.  Where can I be at risk for exposure to secondhand smoke?  For adults, the workplace is the main source of exposure to secondhand smoke. ? Your workplace should have a policy separating smoking areas from nonsmoking areas. ? Smoking areas should have a system for ventilating and cleaning the air.  For children, the home may be the most dangerous place for exposure to secondhand smoke. ? Children who live in apartment buildings may be at  risk from smoke drifting from hallways or other people's homes.  For everyone, many public places are possible sources of exposure to secondhand smoke. ? These places include restaurants, shopping centers, and parks. How can I reduce my risk for exposure to secondhand smoke? The most important thing you can do is not smoke. Discourage family members from smoking. Other ways to reduce exposure for you and your family include the following:  Keep your home smoke free.  Make sure your child care providers do not smoke.  Warn your child about the dangers of smoking and secondhand smoke.  Do not allow smoking in your car. When someone smokes in a car, all the damaging chemicals from the smoke are confined in a small area.  Avoid public places where smoking is allowed.  This information is not intended to replace advice given to you by your health care provider. Make sure you discuss any questions you have with your health care provider. Document Released: 03/27/2004 Document Revised: 01/15/2016 Document Reviewed: 06/03/2013 Elsevier Interactive Patient Education  2017 Elsevier Inc.    Peripheral Vascular Disease Peripheral vascular disease (PVD) is a disease of the blood vessels that are not part of your heart and brain. A simple term for PVD is poor circulation. In most cases, PVD narrows the blood vessels that carry blood from your heart to the rest of your body. This can result in a decreased supply of blood to your arms, legs, and internal organs, like your stomach or kidneys. However, it most often affects a   person's lower legs and feet. There are two types of PVD.  Organic PVD. This is the more common type. It is caused by damage to the structure of blood vessels.  Functional PVD. This is caused by conditions that make blood vessels contract and tighten (spasm).  Without treatment, PVD tends to get worse over time. PVD can also lead to acute ischemic limb. This is when an arm or  limb suddenly has trouble getting enough blood. This is a medical emergency. Follow these instructions at home:  Take medicines only as told by your doctor.  Do not use any tobacco products, including cigarettes, chewing tobacco, or electronic cigarettes. If you need help quitting, ask your doctor.  Lose weight if you are overweight, and maintain a healthy weight as told by your doctor.  Eat a diet that is low in fat and cholesterol. If you need help, ask your doctor.  Exercise regularly. Ask your doctor for some good activities for you.  Take good care of your feet. ? Wear comfortable shoes that fit well. ? Check your feet often for any cuts or sores. Contact a doctor if:  You have cramps in your legs while walking.  You have leg pain when you are at rest.  You have coldness in a leg or foot.  Your skin changes.  You are unable to get or have an erection (erectile dysfunction).  You have cuts or sores on your feet that are not healing. Get help right away if:  Your arm or leg turns cold and blue.  Your arms or legs become red, warm, swollen, painful, or numb.  You have chest pain or trouble breathing.  You suddenly have weakness in your face, arm, or leg.  You become very confused or you cannot speak.  You suddenly have a very bad headache.  You suddenly cannot see. This information is not intended to replace advice given to you by your health care provider. Make sure you discuss any questions you have with your health care provider. Document Released: 05/14/2009 Document Revised: 07/26/2015 Document Reviewed: 07/28/2013 Elsevier Interactive Patient Education  2017 Elsevier Inc.  

## 2017-02-11 ENCOUNTER — Encounter: Payer: Medicare HMO | Admitting: Vascular Surgery

## 2017-02-15 ENCOUNTER — Encounter (HOSPITAL_COMMUNITY): Payer: Self-pay

## 2017-02-15 ENCOUNTER — Emergency Department (HOSPITAL_COMMUNITY)
Admission: EM | Admit: 2017-02-15 | Discharge: 2017-02-15 | Disposition: A | Discharge status: Home / Self Care | Payer: Medicare HMO | Attending: Emergency Medicine | Admitting: Emergency Medicine

## 2017-02-15 ENCOUNTER — Emergency Department (HOSPITAL_COMMUNITY): Payer: Medicare HMO

## 2017-02-15 DIAGNOSIS — Z79899 Other long term (current) drug therapy: Secondary | ICD-10-CM | POA: Diagnosis not present

## 2017-02-15 DIAGNOSIS — F17210 Nicotine dependence, cigarettes, uncomplicated: Secondary | ICD-10-CM | POA: Insufficient documentation

## 2017-02-15 DIAGNOSIS — B370 Candidal stomatitis: Secondary | ICD-10-CM | POA: Insufficient documentation

## 2017-02-15 DIAGNOSIS — R05 Cough: Secondary | ICD-10-CM | POA: Insufficient documentation

## 2017-02-15 DIAGNOSIS — Z853 Personal history of malignant neoplasm of breast: Secondary | ICD-10-CM | POA: Insufficient documentation

## 2017-02-15 DIAGNOSIS — R0789 Other chest pain: Secondary | ICD-10-CM | POA: Insufficient documentation

## 2017-02-15 DIAGNOSIS — Z885 Allergy status to narcotic agent status: Secondary | ICD-10-CM | POA: Diagnosis not present

## 2017-02-15 DIAGNOSIS — Z7982 Long term (current) use of aspirin: Secondary | ICD-10-CM | POA: Diagnosis not present

## 2017-02-15 DIAGNOSIS — R0600 Dyspnea, unspecified: Secondary | ICD-10-CM | POA: Diagnosis not present

## 2017-02-15 DIAGNOSIS — I10 Essential (primary) hypertension: Secondary | ICD-10-CM | POA: Diagnosis not present

## 2017-02-15 DIAGNOSIS — R0981 Nasal congestion: Secondary | ICD-10-CM | POA: Diagnosis not present

## 2017-02-15 DIAGNOSIS — B379 Candidiasis, unspecified: Secondary | ICD-10-CM | POA: Diagnosis not present

## 2017-02-15 DIAGNOSIS — M791 Myalgia, unspecified site: Secondary | ICD-10-CM | POA: Diagnosis not present

## 2017-02-15 DIAGNOSIS — R058 Other specified cough: Secondary | ICD-10-CM

## 2017-02-15 DIAGNOSIS — J449 Chronic obstructive pulmonary disease, unspecified: Secondary | ICD-10-CM | POA: Diagnosis not present

## 2017-02-15 MED ORDER — AEROCHAMBER PLUS FLO-VU SMALL MISC: 1.0000 | Freq: Once | Status: DC

## 2017-02-15 MED ORDER — BENZONATATE 100 MG PO CAPS: 100.0000 mg | ORAL_CAPSULE | Freq: Three times a day (TID) | ORAL | 0 refills | Status: DC

## 2017-02-15 MED ORDER — IPRATROPIUM-ALBUTEROL 0.5-2.5 (3) MG/3ML IN SOLN
3.0000 mL | Freq: Once | RESPIRATORY_TRACT | Status: AC
  Administered 2017-02-15: 3 mL via RESPIRATORY_TRACT
  Filled 2017-02-15: qty 3

## 2017-02-15 MED ORDER — PREDNISONE 20 MG PO TABS: 20.0000 mg | ORAL_TABLET | Freq: Every day | ORAL | 0 refills | Status: AC

## 2017-02-15 MED ORDER — NYSTATIN 100000 UNIT/ML MT SUSP
5.0000 mL | Freq: Four times a day (QID) | OROMUCOSAL | 0 refills | Status: DC
Start: 2017-02-15 — End: 2017-03-13

## 2017-02-15 MED ORDER — BENZONATATE 100 MG PO CAPS
100.0000 mg | ORAL_CAPSULE | Freq: Once | ORAL | Status: AC
  Administered 2017-02-15: 100 mg via ORAL
  Filled 2017-02-15: qty 1

## 2017-02-15 MED ORDER — OPTICHAMBER DIAMOND MISC
1.0000 | Freq: Once | Status: AC
  Administered 2017-02-15: 1
  Filled 2017-02-15: qty 1

## 2017-02-15 NOTE — ED Notes (Signed)
Pt arrived from Xray

## 2017-02-15 NOTE — ED Notes (Signed)
Pt stable, ambulatory, states understanding of discharge instructions 

## 2017-02-15 NOTE — ED Provider Notes (Signed)
[facility] EMERGENCY DEPARTMENT Provider Note   CSN: [id number] Arrival date & time: 02/15/17  4627     History   Chief Complaint Chief Complaint  Patient presents with  . fever/cough    HPI Brandy Stevenson is a 61 y.o. female with a history of COPD, left breast cancer s/p lumpectomy, PAD, HTN, and arthritis presenting with nonproductive cough times x2 weeks. She was seen for the same on 12/3 and now complains of intermittent, sharp chest pain and right groin pain that is brought on with coughing and resolves when the coughing subsides. She reports associated myalgias and nasal congestion x2 days. She denies dyspnea, sore throat, fever, chills, headache, rhinorrhea, leg swelling, rash, N/V/D, or abdominal pain.    She was recently seen by vascular surgery to discuss possible aortobifemoral bypass graft if she fails conservative management with smoking cessation and exercise. She reports she began cutting back on smoking about 4 weeks ago and has not smoked a cigarette in the last week.   She has been compliant with her home medications including Advair and albuterol. She took one half of a tablet of naproxen this morning to help with arthritis without relief.  She reports she has been using 2 puffs of her albuterol every 2-3 hours because she feels as if "something is stuck in her throat that she can't cough up".  She reports that she recently had a swallow test performed which was unremarkable.  She also reports that her PCP recently changed her acid reflux medications from 2 pills daily to 1 pill daily.  The history is provided by the patient. No language interpreter was used.    Past Medical History:  Diagnosis Date  . Arthritis    back problems to be evaluated by neurology next month  . Cancer ([place]) 2009   L, Breast, radiation and surgery.  [name] COPD (chronic obstructive pulmonary disease) ([place])   . Depression   . GERD (gastroesophageal reflux disease)   .  Hyperlipemia   . Hypertension   . Peripheral vascular disease ([place]) 2013    Patient Active Problem List   Diagnosis Date Noted  . Cracked skin on feet 06/27/2016  . Hiatal hernia 05/01/2016  . DJD (degenerative joint disease) of cervical spine 04/17/2016  . History of breast cancer 02/18/2016  . Foot callus 02/18/2016  . Anxiety and depression 05/22/2015  . Insomnia 03/15/2015  . Hamstring tightness of both lower extremities 04/04/2014  . Unspecified vitamin D deficiency 04/08/2013  . Other and unspecified hyperlipidemia 04/08/2013  . Bilateral leg pain 03/22/2013  . Atherosclerotic PVD with intermittent claudication ([place]) 03/22/2013  . Essential hypertension, benign 02/23/2013  . GERD (gastroesophageal reflux disease) 02/23/2013  . COPD (chronic obstructive pulmonary disease) ([place]) 02/23/2013  . Smoking 02/23/2013    Past Surgical History:  Procedure Laterality Date  . APPENDECTOMY  1980  . BREAST LUMPECTOMY Left    s/p radiation therapy   . BREAST SURGERY    . COLONOSCOPY  07/2015  . COLONOSCOPY WITH PROPOFOL N/A 06/26/2015   Procedure: COLONOSCOPY WITH PROPOFOL;  Surgeon: [name], MD;  Location: [facility] END[facility];  Service: Endoscopy;  Laterality: N/A;  . PERIPHERAL VASCULAR CATHETERIZATION N/A 10/22/2015   Procedure: Abdominal Aortogram w/Lower Extremity;  Surgeon: [name], MD;  Location: [facility];  Service: Cardiovascular;  Laterality: N/A;  . TUBAL LIGATION  1991    OB History    No data available       Home Medications  Prior to Admission medications   Medication Sig Start Date End Date Taking? Authorizing Provider  albuterol (PROVENTIL HFA;VENTOLIN HFA) 108 (90 Base) MCG/ACT inhaler Inhale 2 puffs every 6 (six) hours as needed into the lungs for wheezing. 01/09/17   [name], MD  aspirin EC 81 MG tablet Take 1 tablet (81 mg total) by mouth daily. 10/03/16   Funches, Adriana Mccallum, MD  atorvastatin (LIPITOR) 20 MG tablet Take 1  tablet (20 mg total) by mouth daily. 10/03/16   Funches, Adriana Mccallum, MD  benzonatate (TESSALON) 100 MG capsule Take 1 capsule (100 mg total) by mouth 3 (three) times daily. 02/15/17   McDonald, Mia A, PA-C  diclofenac (VOLTAREN) 75 MG EC tablet Take 1 tablet (75 mg total) by mouth 2 (two) times daily. Patient not taking: Reported on 02/04/2017 02/02/17   Fransico Meadow, PA-C  diphenhydrAMINE (BENADRYL) 50 MG capsule Take 1 capsule (50 mg total) by mouth at bedtime as needed. 05/08/16   Funches, Adriana Mccallum, MD  Fluticasone-Salmeterol (ADVAIR) 100-50 MCG/DOSE AEPB Inhale 1 puff into the lungs 2 (two) times daily. 10/03/16   Boykin Nearing, MD  gabapentin (NEURONTIN) 300 MG capsule Take by mouth 300 mg in the day as needed for pain and 300 mg at night 10/03/16   Funches, Josalyn, MD  lisinopril-hydrochlorothiazide (ZESTORETIC) 20-12.5 MG tablet Take 1 tablet by mouth daily. 10/03/16   Funches, Adriana Mccallum, MD  naproxen (NAPROSYN) 500 MG tablet Take 1 tablet (500 mg total) by mouth 2 (two) times daily with a meal. 04/17/16   Funches, Josalyn, MD  nystatin (MYCOSTATIN) 100000 UNIT/ML suspension Take 5 mLs (500,000 Units total) by mouth 4 (four) times daily. 02/15/17   McDonald, Mia A, PA-C  omeprazole (PRILOSEC) 40 MG capsule Take 1 capsule (40 mg total) by mouth daily. 10/03/16   Funches, Adriana Mccallum, MD  potassium chloride SA (K-DUR,KLOR-CON) 20 MEQ tablet Take 1 tablet (20 mEq total) by mouth daily. 10/03/16   Funches, Adriana Mccallum, MD  predniSONE (DELTASONE) 20 MG tablet Take 1 tablet (20 mg total) by mouth daily with breakfast for 5 days. 02/15/17 02/20/17  McDonald, Mia A, PA-C  traMADol (ULTRAM) 50 MG tablet Take 1 tablet (50 mg total) 2 (two) times daily by mouth. 01/09/17   [name], MD    Family History Family History  Problem Relation Age of Onset  . Hyperlipidemia Mother   . Hypertension Mother   . Heart disease Mother        Atrial Fib.  . Hyperlipidemia Sister   . Hypertension Sister   . Heart disease  Sister        Before age 53,  CHF  . Heart attack Brother   . Hyperlipidemia Brother   . Hypertension Brother   . Heart disease Brother   . Hypertension Daughter     Social History Social History   Tobacco Use  . Smoking status: Light Tobacco Smoker    Packs/day: 0.25    Years: 40.00    Pack years: 10.00    Types: Cigarettes  . Smokeless tobacco: Never Used  . Tobacco comment: 3 cigarettes per day  Substance Use Topics  . Alcohol use: No  . Drug use: No     Allergies   Vicodin [hydrocodone-acetaminophen] and Wellbutrin [bupropion]   Review of Systems Review of Systems  Constitutional: Negative for chills and fever.  HENT: Positive for congestion and mouth sores. Negative for ear pain, postnasal drip, rhinorrhea and sore throat.   Respiratory: Positive for cough. Negative for chest  tightness, shortness of breath and wheezing.   Cardiovascular: Negative for chest pain and leg swelling.  Gastrointestinal: Negative for abdominal pain, diarrhea, nausea and vomiting.  Musculoskeletal: Positive for back pain (chronic) and myalgias.  Skin: Negative for rash.  Allergic/Immunologic: Negative for immunocompromised state.  Neurological: Negative for headaches.     Physical Exam Updated Vital Signs BP (!) 92/56   Pulse 83   Temp 99.5 F (37.5 C) (Oral)   Resp 20   SpO2 95%   Physical Exam  Constitutional: No distress.  HENT:  Head: Normocephalic.  Thick white coating consistent with thrush over the tongue.  One 0.76mm ulceration noted to the anterolateral aspect of the tongue.  Eyes: Conjunctivae are normal.  Neck: Normal range of motion. Neck supple.  No meningeal signs.  Cardiovascular: Normal rate, regular rhythm, normal heart sounds and intact distal pulses. Exam reveals no gallop and no friction rub.  No murmur heard. Pulmonary/Chest: Effort normal and breath sounds normal. No stridor. No respiratory distress. She has no wheezes. She has no rales.  Reproducible  TTP over the anterior chest wall.   Abdominal: Soft. Bowel sounds are normal. She exhibits no distension and no mass. There is no tenderness. There is no rebound and no guarding. No hernia.  Musculoskeletal: She exhibits no edema.  DP and PT pulses are 2+ bilaterally.  Mild xerosis of the bilateral lower legs.  No lower extremity edema.  Neurological: She is alert.  Skin: Skin is warm. No rash noted.  Psychiatric: Her behavior is normal.  Nursing note and vitals reviewed.    ED Treatments / Results  Labs (all labs ordered are listed, but only abnormal results are displayed) Labs Reviewed - No data to display  EKG  EKG Interpretation None       Radiology Dg Chest 2 View  Result Date: 02/15/2017 CLINICAL DATA:  Cough for 2 days EXAM: CHEST  2 VIEW COMPARISON:  02/02/2017 FINDINGS: Cardiac shadow is stable. Stable aortic calcifications are seen. The lungs are well aerated without focal infiltrate or sizable effusion. Degenerative changes of the thoracic spine are noted. IMPRESSION: No active cardiopulmonary disease. Electronically Signed   By: Inez Catalina M.D.   On: 02/15/2017 10:42    Procedures Procedures (including critical care time)  Medications Ordered in ED Medications  optichamber diamond 1 each (not administered)  ipratropium-albuterol (DUONEB) 0.5-2.5 (3) MG/3ML nebulizer solution 3 mL (3 mLs Nebulization Given 02/15/17 1128)  benzonatate (TESSALON) capsule 100 mg (100 mg Oral Given 02/15/17 1127)     Initial Impression / Assessment and Plan / ED Course  I have reviewed the triage vital signs and the nursing notes.  Pertinent labs & imaging results that were available during my care of the patient were reviewed by me and considered in my medical decision making (see chart for details).     48-year-old female with a history of COPD, PAD, and left breast cancer s/p lumpectomy, HTN, and arthritis presenting with nonproductive cough times 2 weeks with chest pain  only present when coughing and nasal congestion.  The patient was seen and evaluated with Dr. Tamera Punt, attending physician.  She has been using her albuterol more excessively, 2 puffs every 2-3 hours, for cough and "globus'. She reports a recent swallow study was unremarkable.  She is well-appearing.  She is hemodynamically stable.  On physical exam, she has rhinorrhea, and mild conversational dyspnea, but lungs are clear to auscultation bilaterally.  Chest x-ray is negative for active cardiopulmonary disease.  Doubt  CAD, pneumonia, or influenza. DuoNeb given in the emergency department with improvement of her dyspnea and SaO2.  Will order the patient is spacer as she currently has oral thrush from overuse of her albuterol and not swishing and spitting after using her Advair.  Will prescribe her with some nystatin suspension as well.  Given increase in albuterol use, will discharge her with a burst of prednisone and follow-up with her PCP in 4-5 days if her symptoms are not improving.  Suspect this cough could be related to his COPD versus cough associated with viral illness versus GERD.  Educated the patient on appropriate albuterol use.  Will also provide Tessalon Perles to assist with cough.  She is in no acute distress.  Discussed the plan with the patient and her family who are in agreement at this time.  Strict return precautions given.  The patient is safe for discharge.  Final Clinical Impressions(s) / ED Diagnoses   Final diagnoses:  Nonproductive cough  Thrush, oral    ED Discharge Orders        Ordered    benzonatate (TESSALON) 100 MG capsule  3 times daily     02/15/17 1209    predniSONE (DELTASONE) 20 MG tablet  Daily with breakfast     02/15/17 1209    nystatin (MYCOSTATIN) 100000 UNIT/ML suspension  4 times daily     02/15/17 1209       McDonald, Mia A, PA-C 02/15/17 1220    Malvin Johns, MD 02/15/17 1419

## 2017-02-15 NOTE — Discharge Instructions (Signed)
Albuterol is a medication that is used to treat shortness of breath. Use two puffs of an albuterol with the spacer we provided you no more than once every 4 hours for shortness of breath.  Take 2 tablets of prednisone daily for the next 5 days with breakfast to help with your cough and shortness of breath.   Make sure to gargle with salt water using this and her Advair to avoid getting thrush (the thick white coating) on your tongue.  Please swish and spit the Nystatin solution up to 4 times per day until the thrush on your tongue resolves. Try to swish this around in your mouth for up to a couple of minutes if you can, but do not swallow the Nystatin.  Take 1 tablet of benzonatate every 8 hours as needed for cough.    If your symptoms do not start to improve in the next 4-5 days, please call and schedule a follow-up appointment with your primary care provider.  There can be many reasons that you can have a prolonged cough including a viral infection, COPD, and acid reflux.  Take 650 mg of Tylenol once every 6 hours or continue your home naproxen for body aches.  If you develop new or worsening symptoms, including back pain, shortness of breath, chest pain with movement, or high fever that does not improve with Tylenol, please return to the emergency department for reevaluation.

## 2017-02-15 NOTE — ED Notes (Signed)
Called xray to bring pt back to room E39 when finished.

## 2017-02-15 NOTE — ED Triage Notes (Signed)
Patient complains of fever and cough x 2 days. States that the cough is dry and has hx of COPD

## 2017-02-26 ENCOUNTER — Ambulatory Visit: Payer: Medicare HMO | Admitting: Internal Medicine

## 2017-03-05 ENCOUNTER — Other Ambulatory Visit: Payer: Self-pay

## 2017-03-05 ENCOUNTER — Emergency Department (HOSPITAL_COMMUNITY)
Admission: EM | Admit: 2017-03-05 | Discharge: 2017-03-05 | Disposition: A | Discharge status: Home / Self Care | Payer: Medicare HMO | Attending: Emergency Medicine | Admitting: Emergency Medicine

## 2017-03-05 ENCOUNTER — Encounter (HOSPITAL_COMMUNITY): Payer: Self-pay | Admitting: Emergency Medicine

## 2017-03-05 DIAGNOSIS — Z853 Personal history of malignant neoplasm of breast: Secondary | ICD-10-CM | POA: Insufficient documentation

## 2017-03-05 DIAGNOSIS — J449 Chronic obstructive pulmonary disease, unspecified: Secondary | ICD-10-CM | POA: Insufficient documentation

## 2017-03-05 DIAGNOSIS — L02213 Cutaneous abscess of chest wall: Secondary | ICD-10-CM | POA: Insufficient documentation

## 2017-03-05 DIAGNOSIS — Z87891 Personal history of nicotine dependence: Secondary | ICD-10-CM | POA: Insufficient documentation

## 2017-03-05 DIAGNOSIS — E785 Hyperlipidemia, unspecified: Secondary | ICD-10-CM | POA: Diagnosis not present

## 2017-03-05 DIAGNOSIS — I10 Essential (primary) hypertension: Secondary | ICD-10-CM | POA: Diagnosis not present

## 2017-03-05 DIAGNOSIS — Z7982 Long term (current) use of aspirin: Secondary | ICD-10-CM | POA: Insufficient documentation

## 2017-03-05 DIAGNOSIS — Z79899 Other long term (current) drug therapy: Secondary | ICD-10-CM | POA: Diagnosis not present

## 2017-03-05 DIAGNOSIS — L0291 Cutaneous abscess, unspecified: Secondary | ICD-10-CM

## 2017-03-05 MED ORDER — SULFAMETHOXAZOLE-TRIMETHOPRIM 800-160 MG PO TABS: 1.0000 | ORAL_TABLET | Freq: Two times a day (BID) | ORAL | 0 refills | Status: AC

## 2017-03-05 MED ORDER — LIDOCAINE HCL 2 % IJ SOLN
20.0000 mL | Freq: Once | INTRAMUSCULAR | Status: AC
  Administered 2017-03-05: 400 mg
  Filled 2017-03-05: qty 20

## 2017-03-05 MED ORDER — SULFAMETHOXAZOLE-TRIMETHOPRIM 800-160 MG PO TABS
1.0000 | ORAL_TABLET | Freq: Once | ORAL | Status: AC
  Administered 2017-03-05: 1 via ORAL
  Filled 2017-03-05: qty 1

## 2017-03-05 NOTE — ED Triage Notes (Signed)
Pt c/o abscess to left collar bone x 3 days.

## 2017-03-05 NOTE — ED Provider Notes (Signed)
[facility] DEPARTMENT Provider Note   CSN: [id number] Arrival date & time: 03/05/17  1459     History   Chief Complaint Chief Complaint  Patient presents with  . Abscess    HPI Brandy Stevenson is a 62 y.o. female.  HPI   24 YOF presents today with complaints of abscess.  She notes similar presentation in 2009 with clavicle abscess.  She notes this was I indeed without complication.  She notes 3 days ago she developed redness, swelling in the same area.  She denies any purulent drainage, denies any fever, reports some minor surrounding redness.     Past Medical History:  Diagnosis Date  . Arthritis    back problems to be evaluated by neurology next month  . Cancer ([place]) 2009   L, Breast, radiation and surgery.  [name] COPD (chronic obstructive pulmonary disease) ([place])   . Depression   . GERD (gastroesophageal reflux disease)   . Hyperlipemia   . Hypertension   . Peripheral vascular disease ([place]) 2013    Patient Active Problem List   Diagnosis Date Noted  . Cracked skin on feet 06/27/2016  . Hiatal hernia 05/01/2016  . DJD (degenerative joint disease) of cervical spine 04/17/2016  . History of breast cancer 02/18/2016  . Foot callus 02/18/2016  . Anxiety and depression 05/22/2015  . Insomnia 03/15/2015  . Hamstring tightness of both lower extremities 04/04/2014  . Unspecified vitamin D deficiency 04/08/2013  . Other and unspecified hyperlipidemia 04/08/2013  . Bilateral leg pain 03/22/2013  . Atherosclerotic PVD with intermittent claudication ([place]) 03/22/2013  . Essential hypertension, benign 02/23/2013  . GERD (gastroesophageal reflux disease) 02/23/2013  . COPD (chronic obstructive pulmonary disease) ([place]) 02/23/2013  . Smoking 02/23/2013    Past Surgical History:  Procedure Laterality Date  . APPENDECTOMY  1980  . BREAST LUMPECTOMY Left    s/p radiation therapy   . BREAST SURGERY    . COLONOSCOPY  07/2015  . COLONOSCOPY WITH  PROPOFOL N/A 06/26/2015   Procedure: COLONOSCOPY WITH PROPOFOL;  Surgeon: [name], MD;  Location: [facility];  Service: Endoscopy;  Laterality: N/A;  . PERIPHERAL VASCULAR CATHETERIZATION N/A 10/22/2015   Procedure: Abdominal Aortogram w/Lower Extremity;  Surgeon: [name], MD;  Location: [facility];  Service: Cardiovascular;  Laterality: N/A;  . TUBAL LIGATION  1991    OB History    No data available       Home Medications    Prior to Admission medications   Medication Sig Start Date End Date Taking? Authorizing Provider  albuterol (PROVENTIL HFA;VENTOLIN HFA) 108 (90 Base) MCG/ACT inhaler Inhale 2 puffs every 6 (six) hours as needed into the lungs for wheezing. 01/09/17   [name], MD  aspirin EC 81 MG tablet Take 1 tablet (81 mg total) by mouth daily. 10/03/16   Funches, Adriana Mccallum, MD  atorvastatin (LIPITOR) 20 MG tablet Take 1 tablet (20 mg total) by mouth daily. 10/03/16   Funches, Adriana Mccallum, MD  benzonatate (TESSALON) 100 MG capsule Take 1 capsule (100 mg total) by mouth 3 (three) times daily. 02/15/17   McDonald, Mia A, PA-C  diclofenac (VOLTAREN) 75 MG EC tablet Take 1 tablet (75 mg total) by mouth 2 (two) times daily. Patient not taking: Reported on 02/04/2017 02/02/17   Fransico Meadow, PA-C  diphenhydrAMINE (BENADRYL) 50 MG capsule Take 1 capsule (50 mg total) by mouth at bedtime as needed. 05/08/16   Boykin Nearing, MD  Fluticasone-Salmeterol (ADVAIR) 100-50  MCG/DOSE AEPB Inhale 1 puff into the lungs 2 (two) times daily. 10/03/16   Boykin Nearing, MD  gabapentin (NEURONTIN) 300 MG capsule Take by mouth 300 mg in the day as needed for pain and 300 mg at night 10/03/16   Funches, Josalyn, MD  lisinopril-hydrochlorothiazide (ZESTORETIC) 20-12.5 MG tablet Take 1 tablet by mouth daily. 10/03/16   Funches, Adriana Mccallum, MD  naproxen (NAPROSYN) 500 MG tablet Take 1 tablet (500 mg total) by mouth 2 (two) times daily with a meal. 04/17/16   Funches, Josalyn, MD    nystatin (MYCOSTATIN) 100000 UNIT/ML suspension Take 5 mLs (500,000 Units total) by mouth 4 (four) times daily. 02/15/17   McDonald, Mia A, PA-C  omeprazole (PRILOSEC) 40 MG capsule Take 1 capsule (40 mg total) by mouth daily. 10/03/16   Funches, Adriana Mccallum, MD  potassium chloride SA (K-DUR,KLOR-CON) 20 MEQ tablet Take 1 tablet (20 mEq total) by mouth daily. 10/03/16   Funches, Adriana Mccallum, MD  sulfamethoxazole-trimethoprim (BACTRIM DS,SEPTRA DS) 800-160 MG tablet Take 1 tablet by mouth 2 (two) times daily for 7 days. 03/05/17 03/12/17  Mahin Guardia, Dellis Filbert, PA-C  traMADol (ULTRAM) 50 MG tablet Take 1 tablet (50 mg total) 2 (two) times daily by mouth. 01/09/17   [name], MD    Family History Family History  Problem Relation Age of Onset  . Hyperlipidemia Mother   . Hypertension Mother   . Heart disease Mother        Atrial Fib.  . Hyperlipidemia Sister   . Hypertension Sister   . Heart disease Sister        Before age 16,  CHF  . Heart attack Brother   . Hyperlipidemia Brother   . Hypertension Brother   . Heart disease Brother   . Hypertension Daughter     Social History Social History   Tobacco Use  . Smoking status: Former Smoker    Packs/day: 0.25    Years: 40.00    Pack years: 10.00    Types: Cigarettes  . Smokeless tobacco: Never Used  . Tobacco comment: 3 cigarettes per day  Substance Use Topics  . Alcohol use: No  . Drug use: No     Allergies   Vicodin [hydrocodone-acetaminophen] and Wellbutrin [bupropion]   Review of Systems Review of Systems  All other systems reviewed and are negative.    Physical Exam Updated Vital Signs BP (!) 139/91 (BP Location: Right Arm)   Pulse 74   Temp 98.2 F (36.8 C) (Oral)   Resp 18   Ht 5\' 5"  (1.651 m)   Wt 63.5 kg (140 lb)   SpO2 100%   BMI 23.30 kg/m   Physical Exam  Constitutional: She is oriented to person, place, and time. She appears well-developed and well-nourished.  HENT:  Head: Normocephalic and atraumatic.   Eyes: Conjunctivae are normal. Pupils are equal, round, and reactive to light. Right eye exhibits no discharge. Left eye exhibits no discharge. No scleral icterus.  Neck: Normal range of motion. No JVD present. No tracheal deviation present.  Pulmonary/Chest: Effort normal. No stridor.  Neurological: She is alert and oriented to person, place, and time. Coordination normal.  Skin:  Induration over the left clavicle with central fluctuance and surrounding redness- no discharge   Psychiatric: She has a normal mood and affect. Her behavior is normal. Judgment and thought content normal.  Nursing note and vitals reviewed.    ED Treatments / Results  Labs (all labs ordered are listed, but only abnormal results are displayed)  Labs Reviewed - No data to display  EKG  EKG Interpretation None       Radiology No results found.  Procedures .Marland KitchenIncision and Drainage Date/Time: 03/05/2017 6:01 PM Performed by: Okey Regal, PA-C Authorized by: Okey Regal, PA-C   Consent:    Consent obtained:  Verbal   Consent given by:  Patient   Risks discussed:  Bleeding, incomplete drainage, infection, damage to other organs and pain   Alternatives discussed:  Delayed treatment, alternative treatment, observation and referral Location:    Type:  Abscess   Size:  2 cm    Location: left clavicle  Anesthesia (see MAR for exact dosages):    Anesthesia method:  Local infiltration   Local anesthetic:  Lidocaine 2% w/o epi Procedure type:    Complexity:  Simple Procedure details:    Needle aspiration: no     Incision types:  Single straight   Incision depth:  Dermal   Scalpel blade:  11   Wound management:  Probed and deloculated   Drainage:  Bloody   Drainage amount:  Moderate   Wound treatment:  Wound left open   Packing materials:  None Post-procedure details:    Patient tolerance of procedure:  Tolerated well, no immediate complications   (including critical care  time)  Medications Ordered in ED Medications  sulfamethoxazole-trimethoprim (BACTRIM DS,SEPTRA DS) 800-160 MG per tablet 1 tablet (not administered)  lidocaine (XYLOCAINE) 2 % (with pres) injection 400 mg (400 mg Infiltration Given 03/05/17 1702)     Initial Impression / Assessment and Plan / ED Course  I have reviewed the triage vital signs and the nursing notes.  Pertinent labs & imaging results that were available during my care of the patient were reviewed by me and considered in my medical decision making (see chart for details).      Final Clinical Impressions(s) / ED Diagnoses   Final diagnoses:  Abscess   Labs:   Imaging:  Consults:  Therapeutics: Again  Discharge Meds:   Assessment/Plan: 62 year old female presents today with complaints of abscess.  This is along the clavicle this does appear to be an abscess without significant mass.  Patient does have a history of breast cancer, it is important that she follows up with her primary care for further evaluation of this area to ensure complete resolution and no malignant etiology.  She is given strict return precautions, she verbalized understanding and agreement to today's plan had no further questions or concerns at the time of discharge.   ED Discharge Orders        Ordered    sulfamethoxazole-trimethoprim (BACTRIM DS,SEPTRA DS) 800-160 MG tablet  2 times daily     03/05/17 1813       Okey Regal, PA-C 03/05/17 1814    Quintella Reichert, MD 03/06/17 1253

## 2017-03-05 NOTE — Discharge Instructions (Addendum)
Please read the attached information, please follow-up with your primary care provider for reevaluation and further management of this area.  Please return immediately if any new or worsening signs or symptoms present.

## 2017-03-10 ENCOUNTER — Ambulatory Visit: Payer: Medicare HMO | Attending: Internal Medicine | Admitting: Internal Medicine

## 2017-03-10 ENCOUNTER — Encounter: Payer: Self-pay | Admitting: Internal Medicine

## 2017-03-10 VITALS — BP 78/43 | HR 75 | Temp 99.2°F | Resp 16 | Wt 147.8 lb

## 2017-03-10 DIAGNOSIS — E785 Hyperlipidemia, unspecified: Secondary | ICD-10-CM | POA: Diagnosis not present

## 2017-03-10 DIAGNOSIS — F329 Major depressive disorder, single episode, unspecified: Secondary | ICD-10-CM | POA: Insufficient documentation

## 2017-03-10 DIAGNOSIS — E559 Vitamin D deficiency, unspecified: Secondary | ICD-10-CM | POA: Insufficient documentation

## 2017-03-10 DIAGNOSIS — M47812 Spondylosis without myelopathy or radiculopathy, cervical region: Secondary | ICD-10-CM | POA: Insufficient documentation

## 2017-03-10 DIAGNOSIS — J449 Chronic obstructive pulmonary disease, unspecified: Secondary | ICD-10-CM | POA: Insufficient documentation

## 2017-03-10 DIAGNOSIS — K219 Gastro-esophageal reflux disease without esophagitis: Secondary | ICD-10-CM | POA: Diagnosis not present

## 2017-03-10 DIAGNOSIS — L02213 Cutaneous abscess of chest wall: Secondary | ICD-10-CM | POA: Insufficient documentation

## 2017-03-10 DIAGNOSIS — F419 Anxiety disorder, unspecified: Secondary | ICD-10-CM | POA: Insufficient documentation

## 2017-03-10 DIAGNOSIS — I739 Peripheral vascular disease, unspecified: Secondary | ICD-10-CM | POA: Diagnosis not present

## 2017-03-10 DIAGNOSIS — G500 Trigeminal neuralgia: Secondary | ICD-10-CM | POA: Diagnosis not present

## 2017-03-10 DIAGNOSIS — K449 Diaphragmatic hernia without obstruction or gangrene: Secondary | ICD-10-CM | POA: Insufficient documentation

## 2017-03-10 DIAGNOSIS — Z853 Personal history of malignant neoplasm of breast: Secondary | ICD-10-CM | POA: Diagnosis not present

## 2017-03-10 DIAGNOSIS — Z79899 Other long term (current) drug therapy: Secondary | ICD-10-CM | POA: Diagnosis not present

## 2017-03-10 DIAGNOSIS — Z87891 Personal history of nicotine dependence: Secondary | ICD-10-CM | POA: Insufficient documentation

## 2017-03-10 DIAGNOSIS — M19012 Primary osteoarthritis, left shoulder: Secondary | ICD-10-CM | POA: Diagnosis not present

## 2017-03-10 DIAGNOSIS — M19011 Primary osteoarthritis, right shoulder: Secondary | ICD-10-CM | POA: Diagnosis not present

## 2017-03-10 DIAGNOSIS — I10 Essential (primary) hypertension: Secondary | ICD-10-CM | POA: Diagnosis not present

## 2017-03-10 NOTE — Progress Notes (Addendum)
Patient ID: Brandy Stevenson, female    DOB: 1955/06/26  MRN: [id number]  CC: Hospitalization Follow-up   Subjective: Brandy Stevenson is a 62 y.o. female who presents for ER f/u. Last seen 01/2017.  Her concerns today include:  Hx of PAD, HTN, HL, tob dep, LT breast CA (lumpectomy and XRT 2009), GERD, COPD, Trigeminal neuralgia RT, dep/anxiety, OA shoulders  1.  Since last visit with me, pt has been to ER 3 x. First two times with acute resp symptoms. Most recent visit 03/05/2017 was for abscess over the RT collar bone. It was I&D and pt sent home with Bactrim which she takes Q 12 noon and at 8 p.m.  No cx taken -reports having abscess in same location in 2009 requiring I&D -she has kept area covered with gauze. Drainage and pain have dec.  She was feeling well until last evening when she starting having chills, fever  -no inc cough. No dysuria Patient Active Problem List   Diagnosis Date Noted  . Cracked skin on feet 06/27/2016  . Hiatal hernia 05/01/2016  . DJD (degenerative joint disease) of cervical spine 04/17/2016  . History of breast cancer 02/18/2016  . Foot callus 02/18/2016  . Anxiety and depression 05/22/2015  . Insomnia 03/15/2015  . Hamstring tightness of both lower extremities 04/04/2014  . Unspecified vitamin D deficiency 04/08/2013  . Other and unspecified hyperlipidemia 04/08/2013  . Bilateral leg pain 03/22/2013  . Atherosclerotic PVD with intermittent claudication ([place]) 03/22/2013  . Essential hypertension, benign 02/23/2013  . GERD (gastroesophageal reflux disease) 02/23/2013  . COPD (chronic obstructive pulmonary disease) ([place]) 02/23/2013  . Smoking 02/23/2013     Current Outpatient Medications on File Prior to Visit  Medication Sig Dispense Refill  . albuterol (PROVENTIL HFA;VENTOLIN HFA) 108 (90 Base) MCG/ACT inhaler Inhale 2 puffs every 6 (six) hours as needed into the lungs for wheezing. 3 Inhaler 4  . aspirin EC 81 MG tablet Take 1 tablet (81 mg total)  by mouth daily. 90 tablet 3  . atorvastatin (LIPITOR) 20 MG tablet Take 1 tablet (20 mg total) by mouth daily. 90 tablet 3  . benzonatate (TESSALON) 100 MG capsule Take 1 capsule (100 mg total) by mouth 3 (three) times daily. (Patient not taking: Reported on 03/10/2017) 21 capsule 0  . diclofenac (VOLTAREN) 75 MG EC tablet Take 1 tablet (75 mg total) by mouth 2 (two) times daily. (Patient not taking: Reported on 02/04/2017) 20 tablet 0  . diphenhydrAMINE (BENADRYL) 50 MG capsule Take 1 capsule (50 mg total) by mouth at bedtime as needed. 30 capsule 2  . Fluticasone-Salmeterol (ADVAIR) 100-50 MCG/DOSE AEPB Inhale 1 puff into the lungs 2 (two) times daily. 180 each 3  . gabapentin (NEURONTIN) 300 MG capsule Take by mouth 300 mg in the day as needed for pain and 300 mg at night 180 capsule 3  . lisinopril-hydrochlorothiazide (ZESTORETIC) 20-12.5 MG tablet Take 1 tablet by mouth daily. 90 tablet 3  . naproxen (NAPROSYN) 500 MG tablet Take 1 tablet (500 mg total) by mouth 2 (two) times daily with a meal. 60 tablet 5  . nystatin (MYCOSTATIN) 100000 UNIT/ML suspension Take 5 mLs (500,000 Units total) by mouth 4 (four) times daily. 60 mL 0  . omeprazole (PRILOSEC) 40 MG capsule Take 1 capsule (40 mg total) by mouth daily. 30 capsule 5  . potassium chloride SA (K-DUR,KLOR-CON) 20 MEQ tablet Take 1 tablet (20 mEq total) by mouth daily. 90 tablet 3  . sulfamethoxazole-trimethoprim (BACTRIM DS,SEPTRA DS)  800-160 MG tablet Take 1 tablet by mouth 2 (two) times daily for 7 days. 14 tablet 0  . traMADol (ULTRAM) 50 MG tablet Take 1 tablet (50 mg total) 2 (two) times daily by mouth. (Patient not taking: Reported on 03/10/2017) 60 tablet 1   No current facility-administered medications on file prior to visit.     Allergies  Allergen Reactions  . Vicodin [Hydrocodone-Acetaminophen] Nausea And Vomiting and Other (See Comments)    Sweating  . Wellbutrin [Bupropion]     Made me feel bad    Social History    Socioeconomic History  . Marital status: Married    Spouse name: Not on file  . Number of children: Not on file  . Years of education: Not on file  . Highest education level: Not on file  Social Needs  . Financial resource strain: Not on file  . Food insecurity - worry: Not on file  . Food insecurity - inability: Not on file  . Transportation needs - medical: Not on file  . Transportation needs - non-medical: Not on file  Occupational History  . Not on file  Tobacco Use  . Smoking status: Former Smoker    Packs/day: 0.25    Years: 40.00    Pack years: 10.00    Types: Cigarettes  . Smokeless tobacco: Never Used  . Tobacco comment: 3 cigarettes per day  Substance and Sexual Activity  . Alcohol use: No  . Drug use: No  . Sexual activity: Not Currently  Other Topics Concern  . Not on file  Social History Narrative  . Not on file    Family History  Problem Relation Age of Onset  . Hyperlipidemia Mother   . Hypertension Mother   . Heart disease Mother        Atrial Fib.  . Hyperlipidemia Sister   . Hypertension Sister   . Heart disease Sister        Before age 71,  CHF  . Heart attack Brother   . Hyperlipidemia Brother   . Hypertension Brother   . Heart disease Brother   . Hypertension Daughter     Past Surgical History:  Procedure Laterality Date  . APPENDECTOMY  1980  . BREAST LUMPECTOMY Left    s/p radiation therapy   . BREAST SURGERY    . COLONOSCOPY  07/2015  . COLONOSCOPY WITH PROPOFOL N/A 06/26/2015   Procedure: COLONOSCOPY WITH PROPOFOL;  Surgeon: [name], MD;  Location: [facility];  Service: Endoscopy;  Laterality: N/A;  . PERIPHERAL VASCULAR CATHETERIZATION N/A 10/22/2015   Procedure: Abdominal Aortogram w/Lower Extremity;  Surgeon: [name], MD;  Location: [facility];  Service: Cardiovascular;  Laterality: N/A;  . TUBAL LIGATION  1991    ROS: Review of Systems  PHYSICAL EXAM: BP (!) 78/43   Pulse 75   Temp  99.2 F (37.3 C) (Oral)   Resp 16   Wt 147 lb 12.8 oz (67 kg)   SpO2 97%   BMI 24.60 kg/m   Repeat BP 92/50 Physical Exam  General appearance - alert, well appearing, and in no distress Mental status - alert, oriented to person, place, and time, normal mood, behavior, speech, dress, motor activity, and thought processes Chest - few scattered wheezes Heart - normal rate, regular rhythm, normal S1, S2, no murmurs, rubs, clicks or gallops Skin - area of induration over the proximal RT clavicle. Mild tenderness and warmth.  No drainage expressed from I&D site  ASSESSMENT AND PLAN: 1. Chest wall abscess -she is on day 4 of Bactrim for soft tissue infection/abscess likely MRSA. I am concern about the low grade fever. Location can lead to contiguous spread to the clavicle itself causing osteomyelitis. She has 4 more days of abx. Will have her f/u in 3 days for close observation. If fever increases or pain increases, pt advise to return to ER -with BP running low, pt advise to decrease Lis/HCTZ to 1/2 tab daily. - Basic Metabolic Panel - CBC With Differential  Patient was given the opportunity to ask questions.  Patient verbalized understanding of the plan and was able to repeat key elements of the plan.   Orders Placed This Encounter  Procedures  . Basic Metabolic Panel  . CBC With Differential     Requested Prescriptions    No prescriptions requested or ordered in this encounter    Return in about 3 days (around 03/13/2017).  Karle Plumber, MD, FACP

## 2017-03-10 NOTE — Patient Instructions (Signed)
Cut dose of Lisinopril/HCTZ in half. Continue antibiotics.  Be seen in ER if temp increases. Stop at lab today or return in a.m to have blood test.

## 2017-03-11 ENCOUNTER — Telehealth: Payer: Self-pay | Admitting: Internal Medicine

## 2017-03-11 LAB — CBC WITH DIFFERENTIAL
Basophils Absolute: 0 10*3/uL (ref 0.0–0.2)
Basos: 1 %
EOS (ABSOLUTE): 0.1 10*3/uL (ref 0.0–0.4)
Eos: 3 %
Hematocrit: 32.6 % — ABNORMAL LOW (ref 34.0–46.6)
Hemoglobin: 10.9 g/dL — ABNORMAL LOW (ref 11.1–15.9)
Immature Grans (Abs): 0 10*3/uL (ref 0.0–0.1)
Immature Granulocytes: 0 %
Lymphocytes Absolute: 0.4 10*3/uL — ABNORMAL LOW (ref 0.7–3.1)
Lymphs: 11 %
MCH: 31.4 pg (ref 26.6–33.0)
MCHC: 33.4 g/dL (ref 31.5–35.7)
MCV: 94 fL (ref 79–97)
Monocytes Absolute: 0.1 10*3/uL (ref 0.1–0.9)
Monocytes: 4 %
Neutrophils Absolute: 3 10*3/uL (ref 1.4–7.0)
Neutrophils: 81 %
RBC: 3.47 x10E6/uL — ABNORMAL LOW (ref 3.77–5.28)
RDW: 14.4 % (ref 12.3–15.4)
WBC: 3.7 10*3/uL (ref 3.4–10.8)

## 2017-03-11 LAB — BASIC METABOLIC PANEL
BUN/Creatinine Ratio: 12 (ref 12–28)
BUN: 14 mg/dL (ref 8–27)
CO2: 22 mmol/L (ref 20–29)
Calcium: 8.6 mg/dL — ABNORMAL LOW (ref 8.7–10.3)
Chloride: 99 mmol/L (ref 96–106)
Creatinine, Ser: 1.19 mg/dL — ABNORMAL HIGH (ref 0.57–1.00)
GFR calc Af Amer: 57 mL/min/{1.73_m2} — ABNORMAL LOW (ref >59)
GFR calc non Af Amer: 50 mL/min/{1.73_m2} — ABNORMAL LOW (ref >59)
Glucose: 66 mg/dL (ref 65–99)
Potassium: 3.2 mmol/L — ABNORMAL LOW (ref 3.5–5.2)
Sodium: 141 mmol/L (ref 134–144)

## 2017-03-11 NOTE — Telephone Encounter (Signed)
PC placed to pt today to discuss lab results. K+ is low again. Pt admits that she was not taking Potassium supplement daily.  She was taking it QOD and sometimes forgot to take it even then. Pt advised that the Lis/HCTZ has a diuretic in it that can cause K loss in the urine. Advise to take potassium supplement daily. Creat mildly increase likely due to effect of Bactrim. She has a mild to moderate normocytic anemia. Iron studies done 01/2017 suggest ACD.  Results for orders placed or performed in visit on 98/33/82  Basic Metabolic Panel  Result Value Ref Range   Glucose 66 65 - 99 mg/dL   BUN 14 8 - 27 mg/dL   Creatinine, Ser 1.19 (H) 0.57 - 1.00 mg/dL   GFR calc non Af Amer 50 (L) >59 mL/min/1.73   GFR calc Af Amer 57 (L) >59 mL/min/1.73   BUN/Creatinine Ratio 12 12 - 28   Sodium 141 134 - 144 mmol/L   Potassium 3.2 (L) 3.5 - 5.2 mmol/L   Chloride 99 96 - 106 mmol/L   CO2 22 20 - 29 mmol/L   Calcium 8.6 (L) 8.7 - 10.3 mg/dL  CBC With Differential  Result Value Ref Range   WBC 3.7 3.4 - 10.8 x10E3/uL   RBC 3.47 (L) 3.77 - 5.28 x10E6/uL   Hemoglobin 10.9 (L) 11.1 - 15.9 g/dL   Hematocrit 32.6 (L) 34.0 - 46.6 %   MCV 94 79 - 97 fL   MCH 31.4 26.6 - 33.0 pg   MCHC 33.4 31.5 - 35.7 g/dL   RDW 14.4 12.3 - 15.4 %   Neutrophils 81 Not Estab. %   Lymphs 11 Not Estab. %   Monocytes 4 Not Estab. %   Eos 3 Not Estab. %   Basos 1 Not Estab. %   Neutrophils Absolute 3.0 1.4 - 7.0 x10E3/uL   Lymphocytes Absolute 0.4 (L) 0.7 - 3.1 x10E3/uL   Monocytes Absolute 0.1 0.1 - 0.9 x10E3/uL   EOS (ABSOLUTE) 0.1 0.0 - 0.4 x10E3/uL   Basophils Absolute 0.0 0.0 - 0.2 x10E3/uL   Immature Granulocytes 0 Not Estab. %   Immature Grans (Abs) 0.0 0.0 - 0.1 x10E3/uL

## 2017-03-13 ENCOUNTER — Encounter: Payer: Self-pay | Admitting: Internal Medicine

## 2017-03-13 ENCOUNTER — Ambulatory Visit (HOSPITAL_BASED_OUTPATIENT_CLINIC_OR_DEPARTMENT_OTHER): Payer: Medicare HMO | Admitting: Internal Medicine

## 2017-03-13 ENCOUNTER — Emergency Department (HOSPITAL_COMMUNITY)
Admission: EM | Admit: 2017-03-13 | Discharge: 2017-03-13 | Disposition: A | Discharge status: Home / Self Care | Payer: Medicare HMO | Attending: Emergency Medicine | Admitting: Emergency Medicine

## 2017-03-13 ENCOUNTER — Telehealth: Payer: Self-pay | Admitting: Internal Medicine

## 2017-03-13 ENCOUNTER — Encounter (HOSPITAL_COMMUNITY): Payer: Self-pay | Admitting: *Deleted

## 2017-03-13 VITALS — BP 104/67 | HR 82 | Temp 99.8°F | Resp 16 | Wt 148.8 lb

## 2017-03-13 DIAGNOSIS — Z87891 Personal history of nicotine dependence: Secondary | ICD-10-CM | POA: Diagnosis not present

## 2017-03-13 DIAGNOSIS — F329 Major depressive disorder, single episode, unspecified: Secondary | ICD-10-CM

## 2017-03-13 DIAGNOSIS — J449 Chronic obstructive pulmonary disease, unspecified: Secondary | ICD-10-CM | POA: Insufficient documentation

## 2017-03-13 DIAGNOSIS — I739 Peripheral vascular disease, unspecified: Secondary | ICD-10-CM | POA: Insufficient documentation

## 2017-03-13 DIAGNOSIS — K449 Diaphragmatic hernia without obstruction or gangrene: Secondary | ICD-10-CM

## 2017-03-13 DIAGNOSIS — Z923 Personal history of irradiation: Secondary | ICD-10-CM | POA: Insufficient documentation

## 2017-03-13 DIAGNOSIS — Z789 Other specified health status: Secondary | ICD-10-CM | POA: Diagnosis not present

## 2017-03-13 DIAGNOSIS — K219 Gastro-esophageal reflux disease without esophagitis: Secondary | ICD-10-CM | POA: Insufficient documentation

## 2017-03-13 DIAGNOSIS — L02213 Cutaneous abscess of chest wall: Secondary | ICD-10-CM | POA: Diagnosis not present

## 2017-03-13 DIAGNOSIS — R109 Unspecified abdominal pain: Secondary | ICD-10-CM | POA: Diagnosis not present

## 2017-03-13 DIAGNOSIS — M19011 Primary osteoarthritis, right shoulder: Secondary | ICD-10-CM

## 2017-03-13 DIAGNOSIS — F419 Anxiety disorder, unspecified: Secondary | ICD-10-CM

## 2017-03-13 DIAGNOSIS — G500 Trigeminal neuralgia: Secondary | ICD-10-CM | POA: Insufficient documentation

## 2017-03-13 DIAGNOSIS — Z7982 Long term (current) use of aspirin: Secondary | ICD-10-CM | POA: Insufficient documentation

## 2017-03-13 DIAGNOSIS — L298 Other pruritus: Secondary | ICD-10-CM | POA: Insufficient documentation

## 2017-03-13 DIAGNOSIS — Z853 Personal history of malignant neoplasm of breast: Secondary | ICD-10-CM

## 2017-03-13 DIAGNOSIS — M19012 Primary osteoarthritis, left shoulder: Secondary | ICD-10-CM | POA: Insufficient documentation

## 2017-03-13 DIAGNOSIS — T50995A Adverse effect of other drugs, medicaments and biological substances, initial encounter: Secondary | ICD-10-CM | POA: Insufficient documentation

## 2017-03-13 DIAGNOSIS — E559 Vitamin D deficiency, unspecified: Secondary | ICD-10-CM | POA: Insufficient documentation

## 2017-03-13 DIAGNOSIS — Z79899 Other long term (current) drug therapy: Secondary | ICD-10-CM

## 2017-03-13 DIAGNOSIS — E785 Hyperlipidemia, unspecified: Secondary | ICD-10-CM

## 2017-03-13 DIAGNOSIS — I10 Essential (primary) hypertension: Secondary | ICD-10-CM

## 2017-03-13 DIAGNOSIS — E876 Hypokalemia: Secondary | ICD-10-CM

## 2017-03-13 DIAGNOSIS — R197 Diarrhea, unspecified: Secondary | ICD-10-CM | POA: Insufficient documentation

## 2017-03-13 DIAGNOSIS — L299 Pruritus, unspecified: Secondary | ICD-10-CM | POA: Diagnosis not present

## 2017-03-13 DIAGNOSIS — R062 Wheezing: Secondary | ICD-10-CM | POA: Diagnosis not present

## 2017-03-13 DIAGNOSIS — R21 Rash and other nonspecific skin eruption: Secondary | ICD-10-CM

## 2017-03-13 DIAGNOSIS — B370 Candidal stomatitis: Secondary | ICD-10-CM | POA: Diagnosis not present

## 2017-03-13 LAB — I-STAT CHEM 8, ED
BUN: 16 mg/dL (ref 6–20)
Calcium, Ion: 1.07 mmol/L — ABNORMAL LOW (ref 1.15–1.40)
Chloride: 99 mmol/L — ABNORMAL LOW (ref 101–111)
Creatinine, Ser: 1.1 mg/dL — ABNORMAL HIGH (ref 0.44–1.00)
Glucose, Bld: 87 mg/dL (ref 65–99)
HCT: 33 % — ABNORMAL LOW (ref 36.0–46.0)
Hemoglobin: 11.2 g/dL — ABNORMAL LOW (ref 12.0–15.0)
Potassium: 3.4 mmol/L — ABNORMAL LOW (ref 3.5–5.1)
Sodium: 136 mmol/L (ref 135–145)
TCO2: 23 mmol/L (ref 22–32)

## 2017-03-13 LAB — POCT URINALYSIS DIPSTICK
Bilirubin, UA: NEGATIVE
Glucose, UA: NEGATIVE
Ketones, UA: NEGATIVE
Leukocytes, UA: NEGATIVE
Nitrite, UA: NEGATIVE
Protein, UA: 100
Spec Grav, UA: 1.015 (ref 1.010–1.025)
Urobilinogen, UA: 2 E.U./dL — AB
pH, UA: 6.5 (ref 5.0–8.0)

## 2017-03-13 MED ORDER — DIPHENHYDRAMINE HCL 25 MG PO TABS: 25.0000 mg | ORAL_TABLET | Freq: Four times a day (QID) | ORAL | 0 refills | Status: DC

## 2017-03-13 MED ORDER — PREDNISONE 20 MG PO TABS: 40.0000 mg | ORAL_TABLET | Freq: Every day | ORAL | 0 refills | Status: AC

## 2017-03-13 MED ORDER — NYSTATIN 100000 UNIT/ML MT SUSP: 5.0000 mL | Freq: Four times a day (QID) | OROMUCOSAL | 0 refills | Status: DC

## 2017-03-13 MED ORDER — DOXYCYCLINE HYCLATE 100 MG PO CAPS: 100.0000 mg | ORAL_CAPSULE | Freq: Two times a day (BID) | ORAL | 0 refills | Status: AC

## 2017-03-13 MED ORDER — DIPHENHYDRAMINE HCL 50 MG/ML IJ SOLN
12.5000 mg | Freq: Once | INTRAMUSCULAR | Status: AC
Start: 2017-03-13 — End: 2017-03-13
  Administered 2017-03-13: 12.5 mg via INTRAVENOUS
  Filled 2017-03-13: qty 1

## 2017-03-13 MED ORDER — FAMOTIDINE 20 MG PO TABS: 20.0000 mg | ORAL_TABLET | Freq: Two times a day (BID) | ORAL | 0 refills | Status: DC

## 2017-03-13 MED ORDER — POTASSIUM CHLORIDE ER 10 MEQ PO TBCR: 10.0000 meq | EXTENDED_RELEASE_TABLET | Freq: Every day | ORAL | 2 refills | Status: DC

## 2017-03-13 MED ORDER — METHYLPREDNISOLONE SODIUM SUCC 125 MG IJ SOLR
125.0000 mg | Freq: Once | INTRAMUSCULAR | Status: AC
Start: 2017-03-13 — End: 2017-03-13
  Administered 2017-03-13: 125 mg via INTRAVENOUS
  Filled 2017-03-13: qty 2

## 2017-03-13 MED ORDER — DOXYCYCLINE HYCLATE 100 MG PO TABS
100.0000 mg | ORAL_TABLET | Freq: Once | ORAL | Status: AC
  Administered 2017-03-13: 100 mg via ORAL
  Filled 2017-03-13: qty 1

## 2017-03-13 MED ORDER — FAMOTIDINE IN NACL 20-0.9 MG/50ML-% IV SOLN
20.0000 mg | Freq: Once | INTRAVENOUS | Status: AC
Start: 2017-03-13 — End: 2017-03-13
  Administered 2017-03-13: 20 mg via INTRAVENOUS
  Filled 2017-03-13: qty 50

## 2017-03-13 MED ORDER — LOPERAMIDE HCL 2 MG PO TABS: 2.0000 mg | ORAL_TABLET | Freq: Three times a day (TID) | ORAL | 0 refills | Status: DC | PRN

## 2017-03-13 MED ORDER — DIPHENHYDRAMINE HCL 25 MG PO CAPS
25.0000 mg | ORAL_CAPSULE | Freq: Once | ORAL | Status: AC
  Administered 2017-03-13: 25 mg via ORAL
  Filled 2017-03-13: qty 1

## 2017-03-13 MED ORDER — ONDANSETRON HCL 4 MG PO TABS: 4.0000 mg | ORAL_TABLET | Freq: Three times a day (TID) | ORAL | 0 refills | Status: DC | PRN

## 2017-03-13 MED FILL — NYSTATIN 100,000 UNITS/ML S: 100000 | 3 days supply | Qty: 60 | Fill #0

## 2017-03-13 MED FILL — ONDANSETRON HCL 4 MG TABLET: 4 | 3 days supply | Qty: 10 | Fill #0

## 2017-03-13 MED FILL — POTASSIUM CL 10 MEQ TAB SA: 10 | 30 days supply | Qty: 30 | Fill #0

## 2017-03-13 NOTE — Telephone Encounter (Signed)
Patient called because she was itching all over and I spoke with Dr. Wynetta Emery she told me to inform the patient to take a benadryl and go to the Urgent Care.

## 2017-03-13 NOTE — Progress Notes (Signed)
Patient ID: Brandy Stevenson, female    DOB: 24-Feb-1956  MRN: [id number]  CC:  F/u abscess  Subjective:  Brandy Stevenson is a 62 y.o. female who presents for 3 day f/u of abscess over LT clavical. Her concerns today include:  Hx of PAD, HTN, HL, tob dep, LT breast CA (lumpectomy and XRT 2009), GERD, COPD, Trigeminal neuralgia RT, dep/anxiety, OA shoulders  C/o feeling nauseated x 3 days. Vomited 3 nights ago. Since then she has had dry heaves.  Only able to eat liquid diet.  -sharp intermittent pains on either side of abdomen and lower abdomen that radiates around to back. Episodes last only several sec. Recurs in about 20 mins -endorses diarrhea of 1-2  loose stools a day. No blood in stools -no dysuria or vaginal dischg No one else in her house is sick  Abscess: she continues to keep area over LT clavicle coverage with gauze minimal drainage. -no fevers Has 1 more day of Bactrim  Low K+.  Take K supplement. Slight bump in Creat since being on Bactrim.  Patient Active Problem List   Diagnosis Date Noted  . Cracked skin on feet 06/27/2016  . Hiatal hernia 05/01/2016  . DJD (degenerative joint disease) of cervical spine 04/17/2016  . History of breast cancer 02/18/2016  . Foot callus 02/18/2016  . Anxiety and depression 05/22/2015  . Insomnia 03/15/2015  . Hamstring tightness of both lower extremities 04/04/2014  . Unspecified vitamin D deficiency 04/08/2013  . Other and unspecified hyperlipidemia 04/08/2013  . Bilateral leg pain 03/22/2013  . Atherosclerotic PVD with intermittent claudication ([place]) 03/22/2013  . Essential hypertension, benign 02/23/2013  . GERD (gastroesophageal reflux disease) 02/23/2013  . COPD (chronic obstructive pulmonary disease) ([place]) 02/23/2013  . Smoking 02/23/2013     Current Outpatient Medications on File Prior to Visit  Medication Sig Dispense Refill  . albuterol (PROVENTIL HFA;VENTOLIN HFA) 108 (90 Base) MCG/ACT inhaler Inhale 2 puffs every  6 (six) hours as needed into the lungs for wheezing. 3 Inhaler 4  . aspirin EC 81 MG tablet Take 1 tablet (81 mg total) by mouth daily. 90 tablet 3  . atorvastatin (LIPITOR) 20 MG tablet Take 1 tablet (20 mg total) by mouth daily. 90 tablet 3  . benzonatate (TESSALON) 100 MG capsule Take 1 capsule (100 mg total) by mouth 3 (three) times daily. (Patient not taking: Reported on 03/10/2017) 21 capsule 0  . diclofenac (VOLTAREN) 75 MG EC tablet Take 1 tablet (75 mg total) by mouth 2 (two) times daily. (Patient not taking: Reported on 02/04/2017) 20 tablet 0  . diphenhydrAMINE (BENADRYL) 50 MG capsule Take 1 capsule (50 mg total) by mouth at bedtime as needed. 30 capsule 2  . Fluticasone-Salmeterol (ADVAIR) 100-50 MCG/DOSE AEPB Inhale 1 puff into the lungs 2 (two) times daily. 180 each 3  . gabapentin (NEURONTIN) 300 MG capsule Take by mouth 300 mg in the day as needed for pain and 300 mg at night 180 capsule 3  . lisinopril-hydrochlorothiazide (ZESTORETIC) 20-12.5 MG tablet Take 1 tablet by mouth daily. 90 tablet 3  . naproxen (NAPROSYN) 500 MG tablet Take 1 tablet (500 mg total) by mouth 2 (two) times daily with a meal. 60 tablet 5  . omeprazole (PRILOSEC) 40 MG capsule Take 1 capsule (40 mg total) by mouth daily. 30 capsule 5  . traMADol (ULTRAM) 50 MG tablet Take 1 tablet (50 mg total) 2 (two) times daily by mouth. (Patient not taking: Reported on 03/10/2017) 60 tablet 1  No current facility-administered medications on file prior to visit.     Allergies  Allergen Reactions  . Vicodin [Hydrocodone-Acetaminophen] Nausea And Vomiting and Other (See Comments)    Sweating  . Wellbutrin [Bupropion]     Made me feel bad     ROS: Review of Systems Neg except as above  PHYSICAL EXAM: BP 104/67   Pulse 82   Temp 99.8 F (37.7 C) (Oral)   Resp 16   Wt 148 lb 12.8 oz (67.5 kg)   SpO2 97%   BMI 24.76 kg/m   Wt Readings from Last 3 Encounters:  03/13/17 148 lb 12.8 oz (67.5 kg)  03/10/17 147 lb  12.8 oz (67 kg)  03/05/17 140 lb (63.5 kg)   Physical Exam General appearance - alert, pt initially holding plastic back and would spit into it when she feels a heave.  By the end, pt was noted on her cell phone very relax and chatting Mental status - alert, oriented to person, place, and time, normal mood, behavior, speech, dress, motor activity, and thought processes Mouth: small amount of residual thrush Chest - clear to auscultation, no wheezes, rales or rhonchi, symmetric air entry Heart - normal rate, regular rhythm, normal S1, S2, no murmurs, rubs, clicks or gallops Abdomen - soft, nontender, nondistended, no masses or organomegaly. Soft bruits heard upper mid abdomin on both sides LT>RT Skin - induration, and warm have significantly dec from I&D site over LT clavicle.  No drainage.   Results for orders placed or performed in visit on 03/13/17  POCT urinalysis dipstick  Result Value Ref Range   Color, UA yellow    Clarity, UA clear    Glucose, UA negative    Bilirubin, UA negative    Ketones, UA negative    Spec Grav, UA 1.015 1.010 - 1.025   Blood, UA small    pH, UA 6.5 5.0 - 8.0   Protein, UA 100    Urobilinogen, UA 2.0 (A) 0.2 or 1.0 E.U./dL   Nitrite, UA negative    Leukocytes, UA Negative Negative   Appearance     Odor       ASSESSMENT AND PLAN: 1. Chest wall abscess Clinically improving.  Likely was due to MRSA.  2. Oral thrush Refill nystatin suspension. Recommend washing mouth with warm water use of Advair - nystatin (MYCOSTATIN) 100000 UNIT/ML suspension; Take 5 mLs (500,000 Units total) by mouth 4 (four) times daily. Swish ans swallow  Dispense: 60 mL; Refill: 0  3. Abdominal pain, unspecified abdominal location I think her symptoms are either viral in etiology versus due to Bactrim.  She has only 1 more day left of the Bactrim so we agreed instead of stopping it, she would push fluids like Gatorade and take Imodium and Zofran as needed - loperamide  (IMODIUM A-D) 2 MG tablet; Take 1 tablet (2 mg total) by mouth every 8 (eight) hours as needed for diarrhea or loose stools.  Dispense: 12 tablet; Refill: 0 - ondansetron (ZOFRAN) 4 MG tablet; Take 1 tablet (4 mg total) by mouth every 8 (eight) hours as needed for nausea or vomiting.  Dispense: 10 tablet; Refill: 0 - POCT urinalysis dipstick  4. Drug intolerance See #3 above  5. Hypokalemia -Blood pressure better today.  Since we have decreased the dose of the lisinopril HCTZ to half, we will also decrease the potassium from 20 mg to 10 mg daily - potassium chloride (K-DUR) 10 MEQ tablet; Take 1 tablet (10 mEq total) by  mouth daily.  Dispense: 30 tablet; Refill: 2  Patient was given the opportunity to ask questions.  Patient verbalized understanding of the plan and was able to repeat key elements of the plan.   Orders Placed This Encounter  Procedures  . POCT urinalysis dipstick     Requested Prescriptions   Signed Prescriptions Disp Refills  . loperamide (IMODIUM A-D) 2 MG tablet 12 tablet 0    Sig: Take 1 tablet (2 mg total) by mouth every 8 (eight) hours as needed for diarrhea or loose stools.  . ondansetron (ZOFRAN) 4 MG tablet 10 tablet 0    Sig: Take 1 tablet (4 mg total) by mouth every 8 (eight) hours as needed for nausea or vomiting.  . nystatin (MYCOSTATIN) 100000 UNIT/ML suspension 60 mL 0    Sig: Take 5 mLs (500,000 Units total) by mouth 4 (four) times daily. Swish ans swallow  . potassium chloride (K-DUR) 10 MEQ tablet 30 tablet 2    Sig: Take 1 tablet (10 mEq total) by mouth daily.    Future Appointments  Date Time Provider [place]  03/24/2017 10:30 AM [name], MD [facility]  04/07/2017  9:45 AM [name], MD [facility] None    Karle Plumber, MD, Rosalita [facility]ssman

## 2017-03-13 NOTE — ED Provider Notes (Signed)
[facility] EMERGENCY DEPARTMENT Provider Note   CSN: [id number] Arrival date & time: 03/13/17  1615     History   Chief Complaint Chief Complaint  Patient presents with  . Pruritis    HPI Brandy Stevenson is a 62 y.o. female.  HPI   Patient is a 68-year-old female with history of COPD, hyperlipidemia, hypertension, peripheral vascular disease, cancer of the left breast, status post radiation and surgery presenting for diffuse pruritus.  Patient reports that this began around 2:30 PM.  Patient was evaluated earlier today by her primary care provider in an ED follow-up visit for I&D of a left clavicular abscess.  Patient has been on Bactrim for 6 days.  Patient reports she has had some GI upset over the past couple days, which her primary care provider attributed to Bactrim.  Patient denies any change in soaps, detergents, lotions, or eating any foods that she has not had in the past.  No other new medications besides Bactrim.  Patient denies any angioedema, pharyngeal swelling, increasing wheezing or shortness of breath, or abdominal cramping.  Patient was instructed to take 50 mg of Benadryl and present to the emergency department.  Past Medical History:  Diagnosis Date  . Arthritis    back problems to be evaluated by neurology next month  . Cancer ([place]) 2009   L, Breast, radiation and surgery.  [name] COPD (chronic obstructive pulmonary disease) ([place])   . Depression   . GERD (gastroesophageal reflux disease)   . Hyperlipemia   . Hypertension   . Peripheral vascular disease ([place]) 2013    Patient Active Problem List   Diagnosis Date Noted  . Cracked skin on feet 06/27/2016  . Hiatal hernia 05/01/2016  . DJD (degenerative joint disease) of cervical spine 04/17/2016  . History of breast cancer 02/18/2016  . Foot callus 02/18/2016  . Anxiety and depression 05/22/2015  . Insomnia 03/15/2015  . Hamstring tightness of both lower extremities 04/04/2014  .  Unspecified vitamin D deficiency 04/08/2013  . Other and unspecified hyperlipidemia 04/08/2013  . Bilateral leg pain 03/22/2013  . Atherosclerotic PVD with intermittent claudication ([place]) 03/22/2013  . Essential hypertension, benign 02/23/2013  . GERD (gastroesophageal reflux disease) 02/23/2013  . COPD (chronic obstructive pulmonary disease) ([place]) 02/23/2013  . Smoking 02/23/2013    Past Surgical History:  Procedure Laterality Date  . APPENDECTOMY  1980  . BREAST LUMPECTOMY Left    s/p radiation therapy   . BREAST SURGERY    . COLONOSCOPY  07/2015  . COLONOSCOPY WITH PROPOFOL N/A 06/26/2015   Procedure: COLONOSCOPY WITH PROPOFOL;  Surgeon: [name], MD;  Location: [facility];  Service: Endoscopy;  Laterality: N/A;  . PERIPHERAL VASCULAR CATHETERIZATION N/A 10/22/2015   Procedure: Abdominal Aortogram w/Lower Extremity;  Surgeon: [name], MD;  Location: [facility];  Service: Cardiovascular;  Laterality: N/A;  . TUBAL LIGATION  1991    OB History    No data available       Home Medications    Prior to Admission medications   Medication Sig Start Date End Date Taking? Authorizing Provider  albuterol (PROVENTIL HFA;VENTOLIN HFA) 108 (90 Base) MCG/ACT inhaler Inhale 2 puffs every 6 (six) hours as needed into the lungs for wheezing. 01/09/17   [name], MD  aspirin EC 81 MG tablet Take 1 tablet (81 mg total) by mouth daily. 10/03/16   Funches, Adriana Mccallum, MD  atorvastatin (LIPITOR) 20 MG tablet Take 1 tablet (20 mg total) by  mouth daily. 10/03/16   Funches, Adriana Mccallum, MD  benzonatate (TESSALON) 100 MG capsule Take 1 capsule (100 mg total) by mouth 3 (three) times daily. Patient not taking: Reported on 03/10/2017 02/15/17   McDonald, Maree Erie A, PA-C  diclofenac (VOLTAREN) 75 MG EC tablet Take 1 tablet (75 mg total) by mouth 2 (two) times daily. Patient not taking: Reported on 02/04/2017 02/02/17   Fransico Meadow, PA-C  diphenhydrAMINE (BENADRYL) 25 MG tablet  Take 1 tablet (25 mg total) by mouth every 6 (six) hours for 7 days. 03/13/17 03/20/17  Langston Masker B, PA-C  doxycycline (VIBRAMYCIN) 100 MG capsule Take 1 capsule (100 mg total) by mouth 2 (two) times daily for 7 days. 03/13/17 03/20/17  Langston Masker B, PA-C  famotidine (PEPCID) 20 MG tablet Take 1 tablet (20 mg total) by mouth 2 (two) times daily. 03/13/17   Langston Masker B, PA-C  Fluticasone-Salmeterol (ADVAIR) 100-50 MCG/DOSE AEPB Inhale 1 puff into the lungs 2 (two) times daily. 10/03/16   Boykin Nearing, MD  gabapentin (NEURONTIN) 300 MG capsule Take by mouth 300 mg in the day as needed for pain and 300 mg at night 10/03/16   Funches, Josalyn, MD  lisinopril-hydrochlorothiazide (ZESTORETIC) 20-12.5 MG tablet Take 1 tablet by mouth daily. 10/03/16   Funches, Adriana Mccallum, MD  loperamide (IMODIUM A-D) 2 MG tablet Take 1 tablet (2 mg total) by mouth every 8 (eight) hours as needed for diarrhea or loose stools. 03/13/17   [name], MD  naproxen (NAPROSYN) 500 MG tablet Take 1 tablet (500 mg total) by mouth 2 (two) times daily with a meal. 04/17/16   Funches, Adriana Mccallum, MD  nystatin (MYCOSTATIN) 100000 UNIT/ML suspension Take 5 mLs (500,000 Units total) by mouth 4 (four) times daily. Swish ans swallow 03/13/17   [name], MD  omeprazole (PRILOSEC) 40 MG capsule Take 1 capsule (40 mg total) by mouth daily. 10/03/16   Funches, Adriana Mccallum, MD  ondansetron (ZOFRAN) 4 MG tablet Take 1 tablet (4 mg total) by mouth every 8 (eight) hours as needed for nausea or vomiting. 03/13/17   [name], MD  potassium chloride (K-DUR) 10 MEQ tablet Take 1 tablet (10 mEq total) by mouth daily. 03/13/17   [name], MD  predniSONE (DELTASONE) 20 MG tablet Take 2 tablets (40 mg total) by mouth daily with breakfast for 4 days. 03/13/17 03/17/17  Langston Masker B, PA-C  traMADol (ULTRAM) 50 MG tablet Take 1 tablet (50 mg total) 2 (two) times daily by mouth. Patient not taking: Reported on 03/10/2017 01/09/17    [name], MD    Family History Family History  Problem Relation Age of Onset  . Hyperlipidemia Mother   . Hypertension Mother   . Heart disease Mother        Atrial Fib.  . Hyperlipidemia Sister   . Hypertension Sister   . Heart disease Sister        Before age 78,  CHF  . Heart attack Brother   . Hyperlipidemia Brother   . Hypertension Brother   . Heart disease Brother   . Hypertension Daughter     Social History Social History   Tobacco Use  . Smoking status: Former Smoker    Packs/day: 0.25    Years: 40.00    Pack years: 10.00    Types: Cigarettes    Last attempt to quit: 02/08/2017    Years since quitting: 0.0  . Smokeless tobacco: Never Used  . Tobacco comment: 3 cigarettes  per day  Substance Use Topics  . Alcohol use: No  . Drug use: No     Allergies   Vicodin [hydrocodone-acetaminophen] and Wellbutrin [bupropion]   Review of Systems Review of Systems  Constitutional: Negative for fever.  HENT: Negative for congestion, rhinorrhea, trouble swallowing and voice change.   Respiratory: Positive for wheezing. Negative for chest tightness and stridor.        Patient has wheezing at baseline.  Gastrointestinal: Negative for abdominal pain, nausea and vomiting.  Musculoskeletal: Negative for myalgias.  Skin: Negative for color change and rash.       + Pruritus  All other systems reviewed and are negative.    Physical Exam Updated Vital Signs BP 96/64   Pulse 71   Temp 97.9 F (36.6 C) (Oral)   Resp 14   Ht 5\' 4"  (1.626 m)   Wt 67.1 kg (148 lb)   SpO2 98%   BMI 25.40 kg/m   Physical Exam  Constitutional: She appears well-developed and well-nourished. No distress.  Sitting comfortably in bed.  HENT:  Head: Normocephalic and atraumatic.  Eyes: Conjunctivae are normal. Right eye exhibits no discharge. Left eye exhibits no discharge.  EOMs normal to gross examination.  Neck: Normal range of motion.  Cardiovascular: Normal rate and  regular rhythm.  Intact, 2+ radial pulse  Pulmonary/Chest: Effort normal. She has wheezes.  Normal respiratory effort. Patient converses comfortably. There are soft wheezes.  Abdominal: She exhibits no distension.  Musculoskeletal: Normal range of motion.  Neurological: She is alert.  Cranial nerves intact to gross observation. Patient moves extremities without difficulty.  Skin: Skin is warm and dry. She is not diaphoretic.  There are no maculopapular or urticarial lesions.  No erythema of the skin of bilateral extremities, abdomen, or back. Area over the left clavicle demonstrate some erythema around the site of I&D, however it is still actively draining and appears to be healing appropriately.  Psychiatric: She has a normal mood and affect. Her behavior is normal. Judgment and thought content normal.  Nursing note and vitals reviewed.    ED Treatments / Results  Labs (all labs ordered are listed, but only abnormal results are displayed) Labs Reviewed  I-STAT CHEM 8, ED - Abnormal; Notable for the following components:      Result Value   Potassium 3.4 (*)    Chloride 99 (*)    Creatinine, Ser 1.10 (*)    Calcium, Ion 1.07 (*)    Hemoglobin 11.2 (*)    HCT 33.0 (*)    All other components within normal limits    EKG  EKG Interpretation None       Radiology No results found.  Procedures Procedures (including critical care time)  Medications Ordered in ED Medications  diphenhydrAMINE (BENADRYL) capsule 25 mg (25 mg Oral Given 03/13/17 1901)  diphenhydrAMINE (BENADRYL) capsule 25 mg (25 mg Oral Given 03/13/17 1940)  diphenhydrAMINE (BENADRYL) injection 12.5 mg (12.5 mg Intravenous Given 03/13/17 2032)  methylPREDNISolone sodium succinate (SOLU-MEDROL) 125 mg/2 mL injection 125 mg (125 mg Intravenous Given 03/13/17 2032)  famotidine (PEPCID) IVPB 20 mg premix (0 mg Intravenous Stopped 03/13/17 2105)  doxycycline (VIBRA-TABS) tablet 100 mg (100 mg Oral Given 03/13/17 2154)      Initial Impression / Assessment and Plan / ED Course  I have reviewed the triage vital signs and the nursing notes.  Pertinent labs & imaging results that were available during my care of the patient were reviewed by me and considered in my  medical decision making (see chart for details).     Final Clinical Impressions(s) / ED Diagnoses   Final diagnoses:  Pruritus  Rash   Patient is nontoxic-appearing, non-tachycardic, and demonstrates no evidence of anaphylaxis.  Patient's blood pressure is soft but not hypotensive.  Per chart review and patient report, this is chronic for patient.  Unclear etiology of pruritus, as there is erythema where patient is scratching, however no clear rash.  Rechecked BUN and creatinine today to establish that patient does not have uremia-induced pruritus, and these values are normal and unchanged from earlier this week.  Patient has been observed in the emergency department for severak hours without additional development of symptoms.  Wheezing is likely due to patient's chronic COPD, and does not appear to be part of any anaphylactic sequelae.  Patient did have some minor improvement with Benadryl, methylprednisolone, and Pepcid.  I discussed with patient stopping her Bactrim therapy, and starting with doxycycline.  I encouraged patient to follow-up with her primary care provider on Monday to ensure that the skin infection is healing appropriately on doxycycline therapy.  Patient given return precautions for any further development of skin lesions, distal to breathing, or significant wheezing.  Patient and her niece are in understanding and agree with the plan of care.  This is a shared visit with Dr. Nat Christen. Patient was independently evaluated by this attending physician. Attending physician consulted in evaluation and discharge management.  ED Discharge Orders        Ordered    predniSONE (DELTASONE) 20 MG tablet  Daily with breakfast     03/13/17  2148    diphenhydrAMINE (BENADRYL) 25 MG tablet  Every 6 hours     03/13/17 2148    famotidine (PEPCID) 20 MG tablet  2 times daily     03/13/17 2148    doxycycline (VIBRAMYCIN) 100 MG capsule  2 times daily     03/13/17 2148       Tamala Julian 03/14/17 0308    Nat Christen, MD 03/16/17 [phone]

## 2017-03-13 NOTE — ED Triage Notes (Signed)
Pt c/o generalized body itching onset today, denies taking new meds, no visible hives, denies SOB, A&O x4

## 2017-03-13 NOTE — ED Notes (Signed)
See EDP secondary assessment.  

## 2017-03-13 NOTE — Patient Instructions (Signed)
Wash mouth with warm water after each use of Advir.  Push oral fluids and Gatorade.  Use Imodium and Zofran as needed for nausea and diarrhea.

## 2017-03-13 NOTE — Discharge Instructions (Signed)
Please read and follow all provided instructions.  Your diagnoses today include:  1. Pruritus   2. Rash     Tests performed today include: Vital signs. See below for your results today.   Medications prescribed:   Take any prescribed medications only as directed.  You are prescribed prednisone, a steroid in the ED for itching This is a medication to help reduce inflammation in the skin.  Common side effects include upset stomach/nausea. You may take this medicine with food if this occurs. Other side effects include restlessness, difficulty sleeping, and increased sweating. Call your healthcare provider if these do not resolve after finishing the medication.  This medicine may increase your blood sugar so additional careful monitoring is needed of blood sugar if you have diabetes. Call your healthcare provider for any signs/symtpoms of high blood sugar such as confusion, feeling sleepy, more thirst, more hunger, passing urine more often, flushing, fast breathing, or breath that smells like fruit.  Your prescribed Benadryl.  You may take 25 mg every 4-8 hours.  Your prescribed Pepcid.  You may take 20 mg twice daily for the next 3 days.  Your prescribed doxycycline.  We gave you your first dose in the emergency department today.  Please take all of your antibiotics until finished.   You may develop abdominal discomfort or nausea from the antibiotic. If this occurs, you may take it with food. Some patients also get diarrhea with antibiotics. You may help offset this with probiotics which you can buy or get in yogurt. Do not eat or take the probiotics until 2 hours after your antibiotic. Some women develop vaginal yeast infections after antibiotics. If you develop unusual vaginal discharge after being on this medication, please see your primary care provider.   Some people develop allergies to antibiotics. Symptoms of antibiotic allergy can be mild and include a flat rash and itching. They can  also be more serious and include:  ?Hives - Hives are raised, red patches of skin that are usually very itchy.  ?Lip or tongue swelling  ?Trouble swallowing or breathing  ?Blistering of the skin or mouth.  If you have any of these serious symptoms, please seek emergency medical care immediately.   Home care instructions:  Follow any educational materials contained in this packet  Follow-up instructions: Please follow-up with your primary care provider in the next 3 days for further evaluation of your symptoms.   Return instructions:  Please return to the Emergency Department if you experience worsening symptoms.  Call 9-1-1 immediately if you have an allergic reaction that involves your lips, mouth, throat or if you have any difficulty breathing. This is a life-threatening emergency.  Please return if you have any other emergent concerns.  Additional Information:  Your vital signs today were: BP 102/62 (BP Location: Right Arm)    Pulse 76    Temp 97.9 F (36.6 C) (Oral)    Resp 14    Ht 5\' 4"  (1.626 m)    Wt 67.1 kg (148 lb)    SpO2 100%    BMI 25.40 kg/m  If your blood pressure (BP) was elevated above 130/80 this visit, please have this repeated by your doctor within one month. --------------  Thank you for allowing [place] to participate in your care today!

## 2017-03-14 LAB — URINALYSIS, DIPSTICK ONLY
Bilirubin, UA: NEGATIVE
Glucose, UA: NEGATIVE
Ketones, UA: NEGATIVE
Leukocytes, UA: NEGATIVE
Nitrite, UA: NEGATIVE
Specific Gravity, UA: 1.016 (ref 1.005–1.030)
Urobilinogen, Ur: 1 mg/dL (ref 0.2–1.0)
pH, UA: 6.5 (ref 5.0–7.5)

## 2017-03-24 ENCOUNTER — Encounter: Payer: Self-pay | Admitting: Vascular Surgery

## 2017-03-24 ENCOUNTER — Ambulatory Visit (INDEPENDENT_AMBULATORY_CARE_PROVIDER_SITE_OTHER): Payer: Medicare HMO | Admitting: Vascular Surgery

## 2017-03-24 VITALS — BP 110/77 | HR 64 | Resp 18 | Ht 64.0 in | Wt 144.0 lb

## 2017-03-24 DIAGNOSIS — I7409 Other arterial embolism and thrombosis of abdominal aorta: Secondary | ICD-10-CM

## 2017-03-24 NOTE — Progress Notes (Signed)
Patient name: Brandy Stevenson MRN: [id number] DOB: [birth year] Sex: female  REASON FOR VISIT:   Follow-up of aortoiliac occlusive disease  HPI:   Brandy Stevenson is a pleasant 62 y.o. female who I last saw on 11/22/2015.  She had presented with claudication and underwent an arteriogram on 10/22/2015 which showed that the infrarenal aorta was occluded below the level of the inferior mesenteric artery.  The common iliac arteries were occluded and there was reconstitution of the external iliac arteries bilaterally.  She was seen by our nurse practitioner and her symptoms have progressed she was set up to see me to discuss possible aortofemoral bypass grafting.  Since I saw her last her symptoms in her lower extremities have improved.  She quit smoking on 02/02/2017 and she is very proud of this understandably.  She experiences pain in both calves which is brought on by ambulation and relieved with rest.  Her symptoms are slightly more significant on the left side.  There are no other aggravating or alleviating factors.  She can walk about a half a block before experiencing symptoms.  She denies any history of rest pain or history of nonhealing ulcers.  She is on aspirin and is on a statin.  Past Medical History:  Diagnosis Date  . Arthritis    back problems to be evaluated by neurology next month  . Cancer ([place]) 2009   L, Breast, radiation and surgery.  [name] COPD (chronic obstructive pulmonary disease) ([place])   . Depression   . GERD (gastroesophageal reflux disease)   . Hyperlipemia   . Hypertension   . Peripheral vascular disease ([place]) 2013    Family History  Problem Relation Age of Onset  . Hyperlipidemia Mother   . Hypertension Mother   . Heart disease Mother        Atrial Fib.  . Hyperlipidemia Sister   . Hypertension Sister   . Heart disease Sister        Before age 23,  CHF  . Heart attack Brother   . Hyperlipidemia Brother   . Hypertension Brother   . Heart disease Brother   .  Hypertension Daughter     SOCIAL HISTORY: Social History   Tobacco Use  . Smoking status: Former Smoker    Packs/day: 0.25    Years: 40.00    Pack years: 10.00    Types: Cigarettes    Last attempt to quit: 02/08/2017    Years since quitting: 0.1  . Smokeless tobacco: Never Used  . Tobacco comment: 3 cigarettes per day  Substance Use Topics  . Alcohol use: No    Allergies  Allergen Reactions  . Vicodin [Hydrocodone-Acetaminophen] Nausea And Vomiting and Other (See Comments)    Sweating  . Wellbutrin [Bupropion]     Made me feel bad    Current Outpatient Medications  Medication Sig Dispense Refill  . albuterol (PROVENTIL HFA;VENTOLIN HFA) 108 (90 Base) MCG/ACT inhaler Inhale 2 puffs every 6 (six) hours as needed into the lungs for wheezing. 3 Inhaler 4  . aspirin EC 81 MG tablet Take 1 tablet (81 mg total) by mouth daily. 90 tablet 3  . atorvastatin (LIPITOR) 20 MG tablet Take 1 tablet (20 mg total) by mouth daily. 90 tablet 3  . famotidine (PEPCID) 20 MG tablet Take 1 tablet (20 mg total) by mouth 2 (two) times daily. 30 tablet 0  . Fluticasone-Salmeterol (ADVAIR) 100-50 MCG/DOSE AEPB Inhale 1 puff into the lungs 2 (two) times daily. 180 each 3  .  gabapentin (NEURONTIN) 300 MG capsule Take by mouth 300 mg in the day as needed for pain and 300 mg at night 180 capsule 3  . lisinopril-hydrochlorothiazide (ZESTORETIC) 20-12.5 MG tablet Take 1 tablet by mouth daily. 90 tablet 3  . loperamide (IMODIUM A-D) 2 MG tablet Take 1 tablet (2 mg total) by mouth every 8 (eight) hours as needed for diarrhea or loose stools. 12 tablet 0  . naproxen (NAPROSYN) 500 MG tablet Take 1 tablet (500 mg total) by mouth 2 (two) times daily with a meal. 60 tablet 5  . nystatin (MYCOSTATIN) 100000 UNIT/ML suspension Take 5 mLs (500,000 Units total) by mouth 4 (four) times daily. Swish ans swallow 60 mL 0  . omeprazole (PRILOSEC) 40 MG capsule Take 1 capsule (40 mg total) by mouth daily. 30 capsule 5  .  ondansetron (ZOFRAN) 4 MG tablet Take 1 tablet (4 mg total) by mouth every 8 (eight) hours as needed for nausea or vomiting. 10 tablet 0  . potassium chloride (K-DUR) 10 MEQ tablet Take 1 tablet (10 mEq total) by mouth daily. 30 tablet 2  . traMADol (ULTRAM) 50 MG tablet Take 1 tablet (50 mg total) 2 (two) times daily by mouth. 60 tablet 1  . benzonatate (TESSALON) 100 MG capsule Take 1 capsule (100 mg total) by mouth 3 (three) times daily. (Patient not taking: Reported on 03/10/2017) 21 capsule 0  . diclofenac (VOLTAREN) 75 MG EC tablet Take 1 tablet (75 mg total) by mouth 2 (two) times daily. (Patient not taking: Reported on 02/04/2017) 20 tablet 0  . diphenhydrAMINE (BENADRYL) 25 MG tablet Take 1 tablet (25 mg total) by mouth every 6 (six) hours for 7 days. 28 tablet 0   No current facility-administered medications for this visit.     REVIEW OF SYSTEMS:  [X]  denotes positive finding, [ ]  denotes negative finding Cardiac  Comments:  Chest pain or chest pressure:    Shortness of breath upon exertion:    Short of breath when lying flat:    Irregular heart rhythm:        Vascular    Pain in calf, thigh, or hip brought on by ambulation: x   Pain in feet at night that wakes you up from your sleep:     Blood clot in your veins:    Leg swelling:  x       Pulmonary    Oxygen at home:    Productive cough:     Wheezing:  x       Neurologic    Sudden weakness in arms or legs:     Sudden numbness in arms or legs:     Sudden onset of difficulty speaking or slurred speech:    Temporary loss of vision in one eye:     Problems with dizziness:         Gastrointestinal    Blood in stool:     Vomited blood:         Genitourinary    Burning when urinating:     Blood in urine:        Psychiatric    Major depression:         Hematologic    Bleeding problems:    Problems with blood clotting too easily:        Skin    Rashes or ulcers:        Constitutional    Fever or chills:      PHYSICAL EXAM:   Vitals:  03/24/17 1052  BP: 110/77  Pulse: 64  Resp: 18  SpO2: 99%  Weight: 144 lb (65.3 kg)  Height: 5\' 4"  (1.626 m)    GENERAL: The patient is a well-nourished female, in no acute distress. The vital signs are documented above. CARDIAC: There is a regular rate and rhythm.  VASCULAR: I do not detect carotid bruits. I cannot palpate femoral, popliteal, or pedal pulses. She has no significant lower extremity swelling. PULMONARY: There is good air exchange bilaterally without wheezing or rales. ABDOMEN: Soft and non-tender with normal pitched bowel sounds.  MUSCULOSKELETAL: There are no major deformities or cyanosis. NEUROLOGIC: No focal weakness or paresthesias are detected. SKIN: There are no ulcers or rashes noted. PSYCHIATRIC: The patient has a normal affect.  DATA:    No new data.  MEDICAL ISSUES:   AORTOILIAC OCCLUSIVE DISEASE: This patient's symptoms have improved significantly since she quit smoking on 02/02/2017.  Therefore I think we should hold off on consideration for aortofemoral bypass grafting.  We have discussed the importance of getting on a structured walking program.  I think she would be an excellent candidate for the cardiovascular wellness program once this is up and running.  She does live in [place].  When she returns for her follow-up visit perhaps we can make these arrangements.  We have also discussed the importance of nutrition.  I have ordered follow-up ABIs in 6 months and I will see her back at that time.  She knows to call sooner if she has problems.  Deitra Mayo [facility] [phone]

## 2017-04-01 ENCOUNTER — Encounter: Payer: Medicare HMO | Admitting: Vascular Surgery

## 2017-04-01 NOTE — Addendum Note (Signed)
Addended by: Lianne Cure A on: 04/01/2017 12:58 PM   Modules accepted: Orders

## 2017-04-07 ENCOUNTER — Encounter: Payer: Self-pay | Admitting: Internal Medicine

## 2017-04-07 ENCOUNTER — Ambulatory Visit: Payer: Medicare HMO | Attending: Internal Medicine | Admitting: Internal Medicine

## 2017-04-07 VITALS — BP 130/81 | HR 57 | Temp 98.2°F | Resp 16 | Wt 153.4 lb

## 2017-04-07 DIAGNOSIS — Z7982 Long term (current) use of aspirin: Secondary | ICD-10-CM | POA: Insufficient documentation

## 2017-04-07 DIAGNOSIS — Z9889 Other specified postprocedural states: Secondary | ICD-10-CM | POA: Insufficient documentation

## 2017-04-07 DIAGNOSIS — G8929 Other chronic pain: Secondary | ICD-10-CM | POA: Diagnosis not present

## 2017-04-07 DIAGNOSIS — Z885 Allergy status to narcotic agent status: Secondary | ICD-10-CM | POA: Diagnosis not present

## 2017-04-07 DIAGNOSIS — I739 Peripheral vascular disease, unspecified: Secondary | ICD-10-CM | POA: Diagnosis not present

## 2017-04-07 DIAGNOSIS — N289 Disorder of kidney and ureter, unspecified: Secondary | ICD-10-CM | POA: Insufficient documentation

## 2017-04-07 DIAGNOSIS — Z853 Personal history of malignant neoplasm of breast: Secondary | ICD-10-CM | POA: Insufficient documentation

## 2017-04-07 DIAGNOSIS — F419 Anxiety disorder, unspecified: Secondary | ICD-10-CM | POA: Insufficient documentation

## 2017-04-07 DIAGNOSIS — Z79899 Other long term (current) drug therapy: Secondary | ICD-10-CM | POA: Insufficient documentation

## 2017-04-07 DIAGNOSIS — M545 Low back pain, unspecified: Secondary | ICD-10-CM

## 2017-04-07 DIAGNOSIS — Z87891 Personal history of nicotine dependence: Secondary | ICD-10-CM | POA: Diagnosis not present

## 2017-04-07 DIAGNOSIS — M19011 Primary osteoarthritis, right shoulder: Secondary | ICD-10-CM | POA: Diagnosis not present

## 2017-04-07 DIAGNOSIS — Z9851 Tubal ligation status: Secondary | ICD-10-CM | POA: Diagnosis not present

## 2017-04-07 DIAGNOSIS — K219 Gastro-esophageal reflux disease without esophagitis: Secondary | ICD-10-CM | POA: Insufficient documentation

## 2017-04-07 DIAGNOSIS — Z8249 Family history of ischemic heart disease and other diseases of the circulatory system: Secondary | ICD-10-CM | POA: Diagnosis not present

## 2017-04-07 DIAGNOSIS — J449 Chronic obstructive pulmonary disease, unspecified: Secondary | ICD-10-CM | POA: Diagnosis not present

## 2017-04-07 DIAGNOSIS — I10 Essential (primary) hypertension: Secondary | ICD-10-CM | POA: Insufficient documentation

## 2017-04-07 MED ORDER — TRAMADOL HCL 50 MG PO TABS: 50.0000 mg | ORAL_TABLET | Freq: Two times a day (BID) | ORAL | 1 refills | Status: DC | PRN

## 2017-04-07 NOTE — Progress Notes (Signed)
Patient ID: Brandy Stevenson, female    DOB: 1955/07/18  MRN: [id number]  CC: Follow-up (3 months)   Subjective: Brandy Stevenson is a 62 y.o. female who presents for chronic ds management. Her concerns today include:  Hx of PAD, HTN, HL, tob dep, LT breast CA (lumpectomy and XRT 2009), GERD, COPD, Trigeminal neuralgia RT, dep/anxiety, OA shoulders  1.  PAD:  Saw vascular surgeon in f/u recently.  Symptoms of claudication improved since she d/c smoking so decision made to hold off on A-F bypass.  Pt encouraged to get on structured walking program.   2.  Tob dep:  Quit smoking 2 mths ago. "I feel great about my turn out so far."   3.  Requesting RF on Naprosyn.  Pain mainly in lower back and legs.  Pain in lower back for a few yrs.  Had MRI 07/2015 that revealed mild degen change and small RT foraminal disc protrusion L3-L4 with associated disc bulging.  Never received the Tramadol prescribed for her 01/2017 for pain in shoulders from OA.  Rxn was sent to [facility] prescription.  Pain still in shoulders but not as bad.   4.  HTN:  Lis/HCTZ dose cut in half on last visit Patient Active Problem List   Diagnosis Date Noted  . Cracked skin on feet 06/27/2016  . Hiatal hernia 05/01/2016  . DJD (degenerative joint disease) of cervical spine 04/17/2016  . History of breast cancer 02/18/2016  . Foot callus 02/18/2016  . Anxiety and depression 05/22/2015  . Insomnia 03/15/2015  . Hamstring tightness of both lower extremities 04/04/2014  . Unspecified vitamin D deficiency 04/08/2013  . Other and unspecified hyperlipidemia 04/08/2013  . Bilateral leg pain 03/22/2013  . Atherosclerotic PVD with intermittent claudication ([place]) 03/22/2013  . Essential hypertension, benign 02/23/2013  . GERD (gastroesophageal reflux disease) 02/23/2013  . COPD (chronic obstructive pulmonary disease) ([place]) 02/23/2013  . Smoking 02/23/2013     Current Outpatient Medications on File Prior to Visit  Medication  Sig Dispense Refill  . albuterol (PROVENTIL HFA;VENTOLIN HFA) 108 (90 Base) MCG/ACT inhaler Inhale 2 puffs every 6 (six) hours as needed into the lungs for wheezing. 3 Inhaler 4  . aspirin EC 81 MG tablet Take 1 tablet (81 mg total) by mouth daily. 90 tablet 3  . atorvastatin (LIPITOR) 20 MG tablet Take 1 tablet (20 mg total) by mouth daily. 90 tablet 3  . diclofenac (VOLTAREN) 75 MG EC tablet Take 1 tablet (75 mg total) by mouth 2 (two) times daily. (Patient not taking: Reported on 02/04/2017) 20 tablet 0  . diphenhydrAMINE (BENADRYL) 25 MG tablet Take 1 tablet (25 mg total) by mouth every 6 (six) hours for 7 days. 28 tablet 0  . famotidine (PEPCID) 20 MG tablet Take 1 tablet (20 mg total) by mouth 2 (two) times daily. 30 tablet 0  . Fluticasone-Salmeterol (ADVAIR) 100-50 MCG/DOSE AEPB Inhale 1 puff into the lungs 2 (two) times daily. 180 each 3  . gabapentin (NEURONTIN) 300 MG capsule Take by mouth 300 mg in the day as needed for pain and 300 mg at night 180 capsule 3  . lisinopril-hydrochlorothiazide (ZESTORETIC) 20-12.5 MG tablet Take 1 tablet by mouth daily. 90 tablet 3  . loperamide (IMODIUM A-D) 2 MG tablet Take 1 tablet (2 mg total) by mouth every 8 (eight) hours as needed for diarrhea or loose stools. 12 tablet 0  . nystatin (MYCOSTATIN) 100000 UNIT/ML suspension Take 5 mLs (500,000 Units total) by mouth 4 (four)  times daily. Swish ans swallow 60 mL 0  . omeprazole (PRILOSEC) 40 MG capsule Take 1 capsule (40 mg total) by mouth daily. 30 capsule 5  . potassium chloride (K-DUR) 10 MEQ tablet Take 1 tablet (10 mEq total) by mouth daily. 30 tablet 2   No current facility-administered medications on file prior to visit.     Allergies  Allergen Reactions  . Vicodin [Hydrocodone-Acetaminophen] Nausea And Vomiting and Other (See Comments)    Sweating  . Wellbutrin [Bupropion]     Made me feel bad    Social History   Socioeconomic History  . Marital status: Married    Spouse name: Not on  file  . Number of children: Not on file  . Years of education: Not on file  . Highest education level: Not on file  Social Needs  . Financial resource strain: Not on file  . Food insecurity - worry: Not on file  . Food insecurity - inability: Not on file  . Transportation needs - medical: Not on file  . Transportation needs - non-medical: Not on file  Occupational History  . Not on file  Tobacco Use  . Smoking status: Former Smoker    Packs/day: 0.25    Years: 40.00    Pack years: 10.00    Types: Cigarettes    Last attempt to quit: 02/08/2017    Years since quitting: 0.1  . Smokeless tobacco: Never Used  . Tobacco comment: 3 cigarettes per day  Substance and Sexual Activity  . Alcohol use: No  . Drug use: No  . Sexual activity: Not Currently  Other Topics Concern  . Not on file  Social History Narrative  . Not on file    Family History  Problem Relation Age of Onset  . Hyperlipidemia Mother   . Hypertension Mother   . Heart disease Mother        Atrial Fib.  . Hyperlipidemia Sister   . Hypertension Sister   . Heart disease Sister        Before age 41,  CHF  . Heart attack Brother   . Hyperlipidemia Brother   . Hypertension Brother   . Heart disease Brother   . Hypertension Daughter     Past Surgical History:  Procedure Laterality Date  . APPENDECTOMY  1980  . BREAST LUMPECTOMY Left    s/p radiation therapy   . BREAST SURGERY    . COLONOSCOPY  07/2015  . COLONOSCOPY WITH PROPOFOL N/A 06/26/2015   Procedure: COLONOSCOPY WITH PROPOFOL;  Surgeon: [name], MD;  Location: [facility];  Service: Endoscopy;  Laterality: N/A;  . PERIPHERAL VASCULAR CATHETERIZATION N/A 10/22/2015   Procedure: Abdominal Aortogram w/Lower Extremity;  Surgeon: [name], MD;  Location: [facility];  Service: Cardiovascular;  Laterality: N/A;  . TUBAL LIGATION  1991    ROS: Review of Systems Negative except as stated above PHYSICAL EXAM: BP 130/81    Pulse (!) 57   Temp 98.2 F (36.8 C) (Oral)   Resp 16   Wt 153 lb 6.4 oz (69.6 kg)   SpO2 95%   BMI 26.33 kg/m   Wt Readings from Last 3 Encounters:  04/07/17 153 lb 6.4 oz (69.6 kg)  03/24/17 144 lb (65.3 kg)  03/13/17 148 lb (67.1 kg)    Physical Exam General appearance - alert, well appearing, and in no distress Mental status - alert, oriented to person, place, and time, normal mood, behavior, speech, dress, motor activity, and  thought processes Neck - supple, no significant adenopathy Chest - clear to auscultation, no wheezes, rales or rhonchi, symmetric air entry Heart - normal rate, regular rhythm, normal S1, S2, no murmurs, rubs, clicks or gallops Extremities - peripheral pulses normal, no pedal edema, no clubbing or cyanosis Skin - area over LT collar bone is well healed MSK:  Shoulders: No point tenderness.  Good range of motion. Lumbar spine: No point tenderness.    Chemistry      Component Value Date/Time   NA 136 03/13/2017 1941   NA 141 03/10/2017 1335   K 3.4 (L) 03/13/2017 1941   CL 99 (L) 03/13/2017 1941   CO2 22 03/10/2017 1335   BUN 16 03/13/2017 1941   BUN 14 03/10/2017 1335   CREATININE 1.10 (H) 03/13/2017 1941   CREATININE 0.77 04/17/2016 1104      Component Value Date/Time   CALCIUM 8.6 (L) 03/10/2017 1335   ALKPHOS 117 10/03/2016 0954   AST 35 10/03/2016 0954   ALT 28 10/03/2016 0954   BILITOT <0.2 10/03/2016 0954     Lab Results  Component Value Date   WBC 3.7 03/10/2017   HGB 11.2 (L) 03/13/2017   HCT 33.0 (L) 03/13/2017   MCV 94 03/10/2017   PLT 208 01/05/2017    ASSESSMENT AND PLAN: 1. Essential hypertension At goal.  Continue current medication.  2. Former smoker Commended her on quitting.  Encouraged her to remain tobacco free.  3. PAD (peripheral artery disease) ([place]) -Encourage patient to walk at the mall or place that is convenient to her at least 3 times a week.  Walk rest cycles discussed  4. Primary osteoarthritis of  right shoulder 5. Chronic midline low back pain without sciatica Given the renal insufficiency seen on last BMP (likely due to her being on Bactrim at the time), I advised against daily Naprosyn.  She is willing to try the tramadol.  I went over possible side effects including constipation and drowsiness.  Patient informed that the medication is a controlled substance and as such people can develop dependence and sometimes addiction.  Advised to take the medicine only when needed. -NCCSRS reviewed and is appropriate. -CMA went over controlled substance prescribing agreement with her today  6. Renal insufficiency - Basic metabolic panel  Patient was given the opportunity to ask questions.  Patient verbalized understanding of the plan and was able to repeat key elements of the plan.   Orders Placed This Encounter  Procedures  . Basic metabolic panel     Requested Prescriptions   Signed Prescriptions Disp Refills  . traMADol (ULTRAM) 50 MG tablet 60 tablet 1    Sig: Take 1 tablet (50 mg total) by mouth every 12 (twelve) hours as needed.    No Follow-up on file.  Karle Plumber, MD, FACP

## 2017-04-07 NOTE — Progress Notes (Signed)
Pt states she has been 60 days smoking free

## 2017-04-07 NOTE — Patient Instructions (Signed)
Stop Naprosyn. Start Tramadol as needed for pain.  Try to walk three times a week.

## 2017-04-08 ENCOUNTER — Telehealth: Payer: Self-pay | Admitting: Internal Medicine

## 2017-04-08 ENCOUNTER — Ambulatory Visit: Payer: Medicare HMO | Admitting: Family

## 2017-04-08 ENCOUNTER — Encounter (HOSPITAL_COMMUNITY): Payer: Medicare HMO

## 2017-04-08 DIAGNOSIS — E876 Hypokalemia: Secondary | ICD-10-CM

## 2017-04-08 LAB — BASIC METABOLIC PANEL
BUN/Creatinine Ratio: 18 (ref 12–28)
BUN: 15 mg/dL (ref 8–27)
CO2: 27 mmol/L (ref 20–29)
Calcium: 8.9 mg/dL (ref 8.7–10.3)
Chloride: 104 mmol/L (ref 96–106)
Creatinine, Ser: 0.82 mg/dL (ref 0.57–1.00)
GFR calc Af Amer: 90 mL/min/{1.73_m2} (ref >59)
GFR calc non Af Amer: 78 mL/min/{1.73_m2} (ref >59)
Glucose: 96 mg/dL (ref 65–99)
Potassium: 3.1 mmol/L — ABNORMAL LOW (ref 3.5–5.2)
Sodium: 144 mmol/L (ref 134–144)

## 2017-04-08 MED ORDER — POTASSIUM CHLORIDE ER 20 MEQ PO TBCR: 20.0000 meq | EXTENDED_RELEASE_TABLET | Freq: Every day | ORAL | 3 refills | Status: DC

## 2017-04-08 NOTE — Telephone Encounter (Signed)
PC placed to pt to inform her of low K+. Pt reports she has been taking the Kdur 10 mg daily and taking 1/2 of the Lis/HCTZ 20mg /12.5 mg.  I advise eating more K+ rich foods like bananas and OJ.  Also advise increasing Kdur to 20 mg daily which means she will take two of the 10 mg tabs.   I will send new rxn to [facility] for the 20 mg tabs.  Advise to look on bottle when she receives next set of K+ pills to make sure it tis the 20 mg tabs.  Pt expressed understanding.

## 2017-04-09 ENCOUNTER — Ambulatory Visit: Payer: Medicare HMO | Admitting: Family

## 2017-04-09 ENCOUNTER — Encounter (HOSPITAL_COMMUNITY): Payer: Medicare HMO

## 2017-05-07 ENCOUNTER — Telehealth: Payer: Self-pay | Admitting: Internal Medicine

## 2017-05-07 NOTE — Telephone Encounter (Signed)
2 page, paperwork received through fax 05-07-17.

## 2017-07-06 ENCOUNTER — Ambulatory Visit: Payer: Medicare HMO | Attending: Internal Medicine | Admitting: Internal Medicine

## 2017-07-06 ENCOUNTER — Encounter: Payer: Self-pay | Admitting: Internal Medicine

## 2017-07-06 VITALS — BP 129/82 | HR 59 | Temp 98.4°F | Resp 16 | Wt 162.4 lb

## 2017-07-06 DIAGNOSIS — Z7982 Long term (current) use of aspirin: Secondary | ICD-10-CM | POA: Diagnosis not present

## 2017-07-06 DIAGNOSIS — M545 Low back pain, unspecified: Secondary | ICD-10-CM

## 2017-07-06 DIAGNOSIS — F329 Major depressive disorder, single episode, unspecified: Secondary | ICD-10-CM | POA: Diagnosis not present

## 2017-07-06 DIAGNOSIS — B370 Candidal stomatitis: Secondary | ICD-10-CM | POA: Insufficient documentation

## 2017-07-06 DIAGNOSIS — J439 Emphysema, unspecified: Secondary | ICD-10-CM | POA: Diagnosis not present

## 2017-07-06 DIAGNOSIS — E876 Hypokalemia: Secondary | ICD-10-CM | POA: Insufficient documentation

## 2017-07-06 DIAGNOSIS — Z9851 Tubal ligation status: Secondary | ICD-10-CM | POA: Insufficient documentation

## 2017-07-06 DIAGNOSIS — F419 Anxiety disorder, unspecified: Secondary | ICD-10-CM | POA: Diagnosis not present

## 2017-07-06 DIAGNOSIS — Z885 Allergy status to narcotic agent status: Secondary | ICD-10-CM | POA: Diagnosis not present

## 2017-07-06 DIAGNOSIS — K449 Diaphragmatic hernia without obstruction or gangrene: Secondary | ICD-10-CM | POA: Diagnosis not present

## 2017-07-06 DIAGNOSIS — E663 Overweight: Secondary | ICD-10-CM | POA: Insufficient documentation

## 2017-07-06 DIAGNOSIS — Z9889 Other specified postprocedural states: Secondary | ICD-10-CM | POA: Insufficient documentation

## 2017-07-06 DIAGNOSIS — E785 Hyperlipidemia, unspecified: Secondary | ICD-10-CM | POA: Diagnosis not present

## 2017-07-06 DIAGNOSIS — M19011 Primary osteoarthritis, right shoulder: Secondary | ICD-10-CM | POA: Insufficient documentation

## 2017-07-06 DIAGNOSIS — Q809 Congenital ichthyosis, unspecified: Secondary | ICD-10-CM | POA: Insufficient documentation

## 2017-07-06 DIAGNOSIS — Z888 Allergy status to other drugs, medicaments and biological substances status: Secondary | ICD-10-CM | POA: Insufficient documentation

## 2017-07-06 DIAGNOSIS — G8929 Other chronic pain: Secondary | ICD-10-CM | POA: Insufficient documentation

## 2017-07-06 DIAGNOSIS — K219 Gastro-esophageal reflux disease without esophagitis: Secondary | ICD-10-CM | POA: Diagnosis not present

## 2017-07-06 DIAGNOSIS — Z853 Personal history of malignant neoplasm of breast: Secondary | ICD-10-CM | POA: Insufficient documentation

## 2017-07-06 DIAGNOSIS — Z8249 Family history of ischemic heart disease and other diseases of the circulatory system: Secondary | ICD-10-CM | POA: Insufficient documentation

## 2017-07-06 DIAGNOSIS — Z79899 Other long term (current) drug therapy: Secondary | ICD-10-CM | POA: Insufficient documentation

## 2017-07-06 DIAGNOSIS — Z87891 Personal history of nicotine dependence: Secondary | ICD-10-CM | POA: Insufficient documentation

## 2017-07-06 DIAGNOSIS — I70219 Atherosclerosis of native arteries of extremities with intermittent claudication, unspecified extremity: Secondary | ICD-10-CM | POA: Diagnosis not present

## 2017-07-06 DIAGNOSIS — I10 Essential (primary) hypertension: Secondary | ICD-10-CM | POA: Insufficient documentation

## 2017-07-06 DIAGNOSIS — Z923 Personal history of irradiation: Secondary | ICD-10-CM | POA: Insufficient documentation

## 2017-07-06 DIAGNOSIS — G500 Trigeminal neuralgia: Secondary | ICD-10-CM | POA: Diagnosis not present

## 2017-07-06 MED ORDER — MELOXICAM 15 MG PO TABS
15.0000 mg | ORAL_TABLET | Freq: Every day | ORAL | 6 refills | Status: DC
Start: 2017-07-06 — End: 2017-08-20

## 2017-07-06 MED ORDER — AMMONIUM LACTATE 12 % EX CREA: TOPICAL_CREAM | CUTANEOUS | 0 refills | Status: DC | PRN

## 2017-07-06 MED ORDER — NYSTATIN 100000 UNIT/ML MT SUSP: 5.0000 mL | Freq: Four times a day (QID) | OROMUCOSAL | 0 refills | Status: DC

## 2017-07-06 NOTE — Progress Notes (Signed)
Patient ID: Brandy Stevenson, female    DOB: 01-16-1956  MRN: [id number]  CC: No chief complaint on file.   Subjective: Brandy Stevenson is a 62 y.o. female who presents for chronic ds management Her concerns today include:  Hx of PAD, HTN, HL, tob dep, LT breast CA (lumpectomy and XRT 2009), GERD, COPD, Trigeminal neuralgia RT, dep/anxiety, OA shoulders   1.  She has remained tobacco free.  "I walk more, I feel better and breath better." -she has gained 9 lbs.  Finds herself eating more b/w meals.  -breathing good.  Using Advair as prescribed.  Uses Albuterol inh infrequently  2.  Reports episode of thrush last mth.  She has been washing mouth with peroxide and Listerine. -rinses mouth after use of Advair.  3.  HTN/PVD:  Check BP once a day.  Reports good readings.  Walking 4 blocks daily.  Sometimes she has to stop and rest due to pain in calf.  Known PVD.   No CP  4. OA RT shoulder/chronic LBP (mild disc ds):  Does not like taking narcotics.  Tramadol makes her drowsy.  Taking OTC Ibuprofen.     Patient Active Problem List   Diagnosis Date Noted  . Former smoker 04/07/2017  . Primary osteoarthritis of right shoulder 04/07/2017  . Chronic midline low back pain without sciatica 04/07/2017  . Cracked skin on feet 06/27/2016  . Hiatal hernia 05/01/2016  . DJD (degenerative joint disease) of cervical spine 04/17/2016  . History of breast cancer 02/18/2016  . Foot callus 02/18/2016  . Anxiety and depression 05/22/2015  . Insomnia 03/15/2015  . Hamstring tightness of both lower extremities 04/04/2014  . Unspecified vitamin D deficiency 04/08/2013  . Other and unspecified hyperlipidemia 04/08/2013  . Bilateral leg pain 03/22/2013  . Atherosclerotic PVD with intermittent claudication ([place]) 03/22/2013  . Essential hypertension, benign 02/23/2013  . GERD (gastroesophageal reflux disease) 02/23/2013  . COPD (chronic obstructive pulmonary disease) ([place]) 02/23/2013     Current  Outpatient Medications on File Prior to Visit  Medication Sig Dispense Refill  . albuterol (PROVENTIL HFA;VENTOLIN HFA) 108 (90 Base) MCG/ACT inhaler Inhale 2 puffs every 6 (six) hours as needed into the lungs for wheezing. 3 Inhaler 4  . aspirin EC 81 MG tablet Take 1 tablet (81 mg total) by mouth daily. 90 tablet 3  . atorvastatin (LIPITOR) 20 MG tablet Take 1 tablet (20 mg total) by mouth daily. 90 tablet 3  . famotidine (PEPCID) 20 MG tablet Take 1 tablet (20 mg total) by mouth 2 (two) times daily. 30 tablet 0  . Fluticasone-Salmeterol (ADVAIR) 100-50 MCG/DOSE AEPB Inhale 1 puff into the lungs 2 (two) times daily. 180 each 3  . gabapentin (NEURONTIN) 300 MG capsule Take by mouth 300 mg in the day as needed for pain and 300 mg at night 180 capsule 3  . lisinopril-hydrochlorothiazide (ZESTORETIC) 20-12.5 MG tablet Take 1 tablet by mouth daily. 90 tablet 3  . loperamide (IMODIUM A-D) 2 MG tablet Take 1 tablet (2 mg total) by mouth every 8 (eight) hours as needed for diarrhea or loose stools. 12 tablet 0  . omeprazole (PRILOSEC) 40 MG capsule Take 1 capsule (40 mg total) by mouth daily. 30 capsule 5  . potassium chloride 20 MEQ TBCR Take 20 mEq by mouth daily. 90 tablet 3   No current facility-administered medications on file prior to visit.     Allergies  Allergen Reactions  . Vicodin [Hydrocodone-Acetaminophen] Nausea And Vomiting and Other (See  Comments)    Sweating  . Wellbutrin [Bupropion]     Made me feel bad    Social History   Socioeconomic History  . Marital status: Married    Spouse name: Not on file  . Number of children: Not on file  . Years of education: Not on file  . Highest education level: Not on file  Occupational History  . Not on file  Social Needs  . Financial resource strain: Not on file  . Food insecurity:    Worry: Not on file    Inability: Not on file  . Transportation needs:    Medical: Not on file    Non-medical: Not on file  Tobacco Use  .  Smoking status: Former Smoker    Packs/day: 0.25    Years: 40.00    Pack years: 10.00    Types: Cigarettes    Last attempt to quit: 02/08/2017    Years since quitting: 0.4  . Smokeless tobacco: Never Used  . Tobacco comment: 3 cigarettes per day  Substance and Sexual Activity  . Alcohol use: No  . Drug use: No  . Sexual activity: Not Currently  Lifestyle  . Physical activity:    Days per week: Not on file    Minutes per session: Not on file  . Stress: Not on file  Relationships  . Social connections:    Talks on phone: Not on file    Gets together: Not on file    Attends religious service: Not on file    Active member of club or organization: Not on file    Attends meetings of clubs or organizations: Not on file    Relationship status: Not on file  . Intimate partner violence:    Fear of current or ex partner: Not on file    Emotionally abused: Not on file    Physically abused: Not on file    Forced sexual activity: Not on file  Other Topics Concern  . Not on file  Social History Narrative  . Not on file    Family History  Problem Relation Age of Onset  . Hyperlipidemia Mother   . Hypertension Mother   . Heart disease Mother        Atrial Fib.  . Hyperlipidemia Sister   . Hypertension Sister   . Heart disease Sister        Before age 51,  CHF  . Heart attack Brother   . Hyperlipidemia Brother   . Hypertension Brother   . Heart disease Brother   . Hypertension Daughter     Past Surgical History:  Procedure Laterality Date  . APPENDECTOMY  1980  . BREAST LUMPECTOMY Left    s/p radiation therapy   . BREAST SURGERY    . COLONOSCOPY  07/2015  . COLONOSCOPY WITH PROPOFOL N/A 06/26/2015   Procedure: COLONOSCOPY WITH PROPOFOL;  Surgeon: [name], MD;  Location: [facility];  Service: Endoscopy;  Laterality: N/A;  . PERIPHERAL VASCULAR CATHETERIZATION N/A 10/22/2015   Procedure: Abdominal Aortogram w/Lower Extremity;  Surgeon: [name], MD;   Location: [facility];  Service: Cardiovascular;  Laterality: N/A;  . TUBAL LIGATION  1991    ROS: Review of Systems Neg except as above PHYSICAL EXAM: BP 129/82   Pulse (!) 59   Temp 98.4 F (36.9 C) (Oral)   Resp 16   Wt 162 lb 6.4 oz (73.7 kg)   SpO2 99%   BMI 27.88 kg/m  Wt Readings from Last 3 Encounters:  07/06/17 162 lb 6.4 oz (73.7 kg)  04/07/17 153 lb 6.4 oz (69.6 kg)  03/24/17 144 lb (65.3 kg)    Physical Exam  General appearance - alert, well appearing, and in no distress Mental status - normal mood, behavior, speech, dress, motor activity, and thought processes Mouth - no signs of oral thrush, moist mucosa  Neck - supple, no significant adenopathy Chest - clear to auscultation, no wheezes, rales or rhonchi, symmetric air entry Heart - normal rate, regular rhythm, normal S1, S2, no murmurs, rubs, clicks or gallops Extremities - peripheral pulses normal, no pedal edema, no clubbing or cyanosis Skin: Dry, scaliness to the skin on the legs.  ASSESSMENT AND PLAN: 1. Essential hypertension At goal.  Continue current medication and salt restriction - CBC  2. Primary osteoarthritis of right shoulder 3. Chronic midline low back pain without sciatica Stop tramadol. Patient willing to try once daily meloxicam.  Advised to stop ibuprofen - meloxicam (MOBIC) 15 MG tablet; Take 1 tablet (15 mg total) by mouth daily.  Dispense: 30 tablet; Refill: 6  4. Oral thrush Not present at this time.  Patient advised to wash/rinse mouth out after each use of Advair - nystatin (MYCOSTATIN) 100000 UNIT/ML suspension; Take 5 mLs (500,000 Units total) by mouth 4 (four) times daily. Swish ans swallow  Dispense: 60 mL; Refill: 0  5. Over weight Advised patient to cut back on frequent snacking.  She will also cut back on portion sizes for her regular meals.  Continue daily walks.  6. Pulmonary emphysema, unspecified emphysema type ([place]) Stable.  Commended her on remaining free  of cigarettes  7. Hypokalemia - Potassium  8. Ichthyosis - ammonium lactate (LAC-HYDRIN) 12 % cream; Apply topically as needed for dry skin.  Dispense: 385 g; Refill: 0  Patient was given the opportunity to ask questions.  Patient verbalized understanding of the plan and was able to repeat key elements of the plan.   Orders Placed This Encounter  Procedures  . Potassium  . CBC     Requested Prescriptions   Signed Prescriptions Disp Refills  . ammonium lactate (LAC-HYDRIN) 12 % cream 385 g 0    Sig: Apply topically as needed for dry skin.  [name] nystatin (MYCOSTATIN) 100000 UNIT/ML suspension 60 mL 0    Sig: Take 5 mLs (500,000 Units total) by mouth 4 (four) times daily. Swish ans swallow  . meloxicam (MOBIC) 15 MG tablet 30 tablet 6    Sig: Take 1 tablet (15 mg total) by mouth daily.    Return in about 3 months (around 10/06/2017).  Karle Plumber, MD, FACP

## 2017-07-06 NOTE — Progress Notes (Signed)
Pt states she has been taking ibuprofen otc

## 2017-07-06 NOTE — Patient Instructions (Addendum)
Stop Ibuprofen.  Start Meloxicam 15 mg daily instead.   Please rinse mouth after each use of Advair.

## 2017-07-07 LAB — CBC
Hematocrit: 33.2 % — ABNORMAL LOW (ref 34.0–46.6)
Hemoglobin: 11.4 g/dL (ref 11.1–15.9)
MCH: 31.1 pg (ref 26.6–33.0)
MCHC: 34.3 g/dL (ref 31.5–35.7)
MCV: 91 fL (ref 79–97)
Platelets: 191 10*3/uL (ref 150–379)
RBC: 3.66 x10E6/uL — ABNORMAL LOW (ref 3.77–5.28)
RDW: 14 % (ref 12.3–15.4)
WBC: 4 10*3/uL (ref 3.4–10.8)

## 2017-07-07 LAB — POTASSIUM: Potassium: 2.7 mmol/L — ABNORMAL LOW (ref 3.5–5.2)

## 2017-07-08 ENCOUNTER — Telehealth: Payer: Self-pay | Admitting: Internal Medicine

## 2017-07-08 DIAGNOSIS — E875 Hyperkalemia: Secondary | ICD-10-CM

## 2017-07-08 DIAGNOSIS — E876 Hypokalemia: Secondary | ICD-10-CM

## 2017-07-08 MED ORDER — POTASSIUM CHLORIDE ER 20 MEQ PO TBCR: 40.0000 meq | EXTENDED_RELEASE_TABLET | Freq: Every day | ORAL | 3 refills | Status: DC

## 2017-07-08 NOTE — Telephone Encounter (Signed)
PC placed to pt. Pt informed of low K+.  She is taking K+ supplement 20 meq daily.  She was suppose to be on Lis/HCTZ 20/12.5 mg 1/2 tab daily based on OV 04/2017.  However, pt states she went back to taking the full pill after BP inc and she started having HA. I wanted to change to Lis/Norvasc at this pt but pt just got 3 mth supply of Lis/HCTZ.  Therefore pt advise to continue taking and inc K+ supplement to 40 meq daily.  Return to lab next wk for recheck.  Pt to take extra 20 meq of K+ supplement tonight.  She was able to repeat back instructions.

## 2017-07-08 NOTE — Telephone Encounter (Signed)
PC placed to pt this evening.  Pt informed that K+ level is low at 2.7.  She reports compliance with taking K+ supplement of 20 meq daily.  She is on Lis/HCTZ 20/112.5 mg.  She is suppose to be on 1/2 tab a day as of our OV 04/2017.  However, pt states she went back to taking the whole pill because her BP was up and she started to get headaches.   I wanted to change her to Lisinopril/Norvasc and check renin/aldosterone levels.  However, pt states she just received a 3 mth supply of the Lis/HCTZ from [facility].  Therefore, I advise increasing Potassium to 20 meq two tabs daily.  Pt to take an extra 20 meq dose to night then starting tomorrow morning, she will do two 20 meq daily.  Return to lab early next week for recheck K+.  Pt was able to repeat these instructions to me. Results for orders placed or performed in visit on 07/06/17  Potassium  Result Value Ref Range   Potassium 2.7 (L) 3.5 - 5.2 mmol/L  CBC  Result Value Ref Range   WBC 4.0 3.4 - 10.8 x10E3/uL   RBC 3.66 (L) 3.77 - 5.28 x10E6/uL   Hemoglobin 11.4 11.1 - 15.9 g/dL   Hematocrit 33.2 (L) 34.0 - 46.6 %   MCV 91 79 - 97 fL   MCH 31.1 26.6 - 33.0 pg   MCHC 34.3 31.5 - 35.7 g/dL   RDW 14.0 12.3 - 15.4 %   Platelets 191 150 - 379 x10E3/uL

## 2017-07-28 ENCOUNTER — Emergency Department (HOSPITAL_COMMUNITY)
Admission: EM | Admit: 2017-07-28 | Discharge: 2017-07-29 | Disposition: A | Discharge status: Home / Self Care | Payer: Medicare HMO | Attending: Emergency Medicine | Admitting: Emergency Medicine

## 2017-07-28 ENCOUNTER — Other Ambulatory Visit: Payer: Self-pay

## 2017-07-28 DIAGNOSIS — Z5321 Procedure and treatment not carried out due to patient leaving prior to being seen by health care provider: Secondary | ICD-10-CM | POA: Diagnosis not present

## 2017-07-28 DIAGNOSIS — R079 Chest pain, unspecified: Secondary | ICD-10-CM | POA: Diagnosis not present

## 2017-07-28 DIAGNOSIS — R0789 Other chest pain: Secondary | ICD-10-CM | POA: Insufficient documentation

## 2017-07-28 NOTE — ED Triage Notes (Signed)
Pt states that underneath her left breast when she moves a certain way (raising her arm) she has a feeling that "something moves"  Has hx of lumpectomy on that side. Unsure if it is related.  May occur once or twice a month.

## 2017-07-29 ENCOUNTER — Emergency Department (HOSPITAL_COMMUNITY): Payer: Medicare HMO

## 2017-07-29 DIAGNOSIS — R079 Chest pain, unspecified: Secondary | ICD-10-CM | POA: Diagnosis not present

## 2017-07-29 LAB — CBC
HCT: 38.2 % (ref 36.0–46.0)
Hemoglobin: 12.4 g/dL (ref 12.0–15.0)
MCH: 30.1 pg (ref 26.0–34.0)
MCHC: 32.5 g/dL (ref 30.0–36.0)
MCV: 92.7 fL (ref 78.0–100.0)
Platelets: 210 10*3/uL (ref 150–400)
RBC: 4.12 MIL/uL (ref 3.87–5.11)
RDW: 13.7 % (ref 11.5–15.5)
WBC: 5.7 10*3/uL (ref 4.0–10.5)

## 2017-07-29 LAB — BASIC METABOLIC PANEL
Anion gap: 10 (ref 5–15)
BUN: 13 mg/dL (ref 6–20)
CO2: 27 mmol/L (ref 22–32)
Calcium: 9.8 mg/dL (ref 8.9–10.3)
Chloride: 104 mmol/L (ref 101–111)
Creatinine, Ser: 1.03 mg/dL — ABNORMAL HIGH (ref 0.44–1.00)
GFR calc Af Amer: >60 mL/min (ref >60)
GFR calc non Af Amer: 58 mL/min — ABNORMAL LOW (ref >60)
Glucose, Bld: 106 mg/dL — ABNORMAL HIGH (ref 65–99)
Potassium: 3.8 mmol/L (ref 3.5–5.1)
Sodium: 141 mmol/L (ref 135–145)

## 2017-07-29 LAB — I-STAT TROPONIN, ED: Troponin i, poc: 0 ng/mL (ref 0.00–0.08)

## 2017-07-29 NOTE — ED Notes (Addendum)
Follow up call made   Will follow up w clinic or return to ed as needed 1320  07/29/17 s Bessye Stith rn

## 2017-08-19 ENCOUNTER — Other Ambulatory Visit: Payer: Self-pay | Admitting: Internal Medicine

## 2017-08-19 DIAGNOSIS — Q809 Congenital ichthyosis, unspecified: Secondary | ICD-10-CM

## 2017-08-19 DIAGNOSIS — B370 Candidal stomatitis: Secondary | ICD-10-CM

## 2017-08-19 NOTE — Telephone Encounter (Signed)
Patient came in the office and requested for listed medication to be redirected to [facility] pharmacy  meloxicam (MOBIC) 15 MG tablet [[id number]]  ammonium lactate (LAC-HYDRIN) 12 % cream [[id number]]  nystatin (MYCOSTATIN) 100000 UNIT/ML suspension [[id number]]   Patient also requested for her acid reflux medication be refilled through [facility].

## 2017-08-20 ENCOUNTER — Other Ambulatory Visit: Payer: Self-pay

## 2017-08-20 DIAGNOSIS — M545 Low back pain, unspecified: Secondary | ICD-10-CM

## 2017-08-20 DIAGNOSIS — M19011 Primary osteoarthritis, right shoulder: Secondary | ICD-10-CM

## 2017-08-20 DIAGNOSIS — G8929 Other chronic pain: Secondary | ICD-10-CM

## 2017-08-20 MED ORDER — MELOXICAM 15 MG PO TABS: 15.0000 mg | ORAL_TABLET | Freq: Every day | ORAL | 6 refills | Status: DC

## 2017-08-21 MED ORDER — NYSTATIN 100000 UNIT/ML MT SUSP: 5.0000 mL | Freq: Four times a day (QID) | OROMUCOSAL | 0 refills | Status: DC

## 2017-08-21 MED ORDER — AMMONIUM LACTATE 12 % EX CREA: TOPICAL_CREAM | CUTANEOUS | 0 refills | Status: DC | PRN

## 2017-08-22 ENCOUNTER — Encounter (HOSPITAL_COMMUNITY): Payer: Self-pay | Admitting: Emergency Medicine

## 2017-08-22 ENCOUNTER — Emergency Department (HOSPITAL_COMMUNITY)
Admission: EM | Admit: 2017-08-22 | Discharge: 2017-08-22 | Disposition: A | Discharge status: Home / Self Care | Payer: Medicare HMO | Attending: Emergency Medicine | Admitting: Emergency Medicine

## 2017-08-22 ENCOUNTER — Emergency Department (HOSPITAL_BASED_OUTPATIENT_CLINIC_OR_DEPARTMENT_OTHER): Payer: Medicare HMO

## 2017-08-22 ENCOUNTER — Other Ambulatory Visit: Payer: Self-pay

## 2017-08-22 DIAGNOSIS — Z87891 Personal history of nicotine dependence: Secondary | ICD-10-CM | POA: Insufficient documentation

## 2017-08-22 DIAGNOSIS — Z79899 Other long term (current) drug therapy: Secondary | ICD-10-CM | POA: Insufficient documentation

## 2017-08-22 DIAGNOSIS — M79604 Pain in right leg: Secondary | ICD-10-CM

## 2017-08-22 DIAGNOSIS — J449 Chronic obstructive pulmonary disease, unspecified: Secondary | ICD-10-CM | POA: Diagnosis not present

## 2017-08-22 DIAGNOSIS — I10 Essential (primary) hypertension: Secondary | ICD-10-CM | POA: Insufficient documentation

## 2017-08-22 DIAGNOSIS — Z7982 Long term (current) use of aspirin: Secondary | ICD-10-CM | POA: Diagnosis not present

## 2017-08-22 DIAGNOSIS — I739 Peripheral vascular disease, unspecified: Secondary | ICD-10-CM | POA: Diagnosis not present

## 2017-08-22 DIAGNOSIS — Z853 Personal history of malignant neoplasm of breast: Secondary | ICD-10-CM | POA: Insufficient documentation

## 2017-08-22 DIAGNOSIS — M79609 Pain in unspecified limb: Secondary | ICD-10-CM | POA: Diagnosis not present

## 2017-08-22 DIAGNOSIS — I998 Other disorder of circulatory system: Secondary | ICD-10-CM | POA: Diagnosis not present

## 2017-08-22 NOTE — Consult Note (Signed)
[facility]  Patient name: Brandy Stevenson MRN: [id number] DOB: Jan 17, 1956 Sex: female    HPI: Brandy Stevenson is a 62 y.o. female who presents to the emergency room today complaining of worsening bilateral lower extremity discomfort.  She reports inability to walk any distance due to hip and calf claudication.  She also is reporting pain in her feet at night.  She does have degenerative disc disease and has had some relief with Neurontin and there is some overlap in her symptoms.  It seems as though the majority of her symptoms are related to progressive ischemic symptoms.  She was found to have a occluded aorta and common iliac arteries with reconstitution of her external iliac arteries and an arteriogram from 2017.  She most recently saw Dr. Scot Dock in our office in January 2019.  She had stopped smoking and was having tolerable claudication.  Past Medical History:  Diagnosis Date  . Arthritis    back problems to be evaluated by neurology next month  . Cancer ([place]) 2009   L, Breast, radiation and surgery.  [name] COPD (chronic obstructive pulmonary disease) ([place])   . Depression   . GERD (gastroesophageal reflux disease)   . Hyperlipemia   . Hypertension   . Peripheral vascular disease ([place]) 2013    Family History  Problem Relation Age of Onset  . Hyperlipidemia Mother   . Hypertension Mother   . Heart disease Mother        Atrial Fib.  . Hyperlipidemia Sister   . Hypertension Sister   . Heart disease Sister        Before age 65,  CHF  . Heart attack Brother   . Hyperlipidemia Brother   . Hypertension Brother   . Heart disease Brother   . Hypertension Daughter     SOCIAL HISTORY: Social History   Tobacco Use  . Smoking status: Former Smoker    Packs/day: 0.25    Years: 40.00    Pack years: 10.00    Types: Cigarettes    Last attempt to quit: 02/08/2017    Years since quitting: 0.5  . Smokeless tobacco:  Never Used  . Tobacco comment: 3 cigarettes per day  Substance Use Topics  . Alcohol use: No    Allergies  Allergen Reactions  . Vicodin [Hydrocodone-Acetaminophen] Nausea And Vomiting and Other (See Comments)    Sweating  . Wellbutrin [Bupropion]     Made me feel bad    No current facility-administered medications for this encounter.    Current Outpatient Medications  Medication Sig Dispense Refill  . albuterol (PROVENTIL HFA;VENTOLIN HFA) 108 (90 Base) MCG/ACT inhaler Inhale 2 puffs every 6 (six) hours as needed into the lungs for wheezing. 3 Inhaler 4  . ammonium lactate (LAC-HYDRIN) 12 % cream Apply topically as needed for dry skin. 385 g 0  . aspirin EC 81 MG tablet Take 1 tablet (81 mg total) by mouth daily. 90 tablet 3  . atorvastatin (LIPITOR) 20 MG tablet Take 1 tablet (20 mg total) by mouth daily. 90 tablet 3  . famotidine (PEPCID) 20 MG tablet Take 1 tablet (20 mg total) by mouth 2 (two) times daily. 30 tablet 0  . Fluticasone-Salmeterol (ADVAIR) 100-50 MCG/DOSE AEPB Inhale 1 puff into the lungs 2 (two) times daily. 180 each 3  . gabapentin (NEURONTIN) 300 MG capsule Take by mouth 300 mg in the day as needed for pain and 300 mg at night 180 capsule 3  .  lisinopril-hydrochlorothiazide (ZESTORETIC) 20-12.5 MG tablet Take 1 tablet by mouth daily. 90 tablet 3  . loperamide (IMODIUM A-D) 2 MG tablet Take 1 tablet (2 mg total) by mouth every 8 (eight) hours as needed for diarrhea or loose stools. 12 tablet 0  . meloxicam (MOBIC) 15 MG tablet Take 1 tablet (15 mg total) by mouth daily. 30 tablet 6  . nystatin (MYCOSTATIN) 100000 UNIT/ML suspension Take 5 mLs (500,000 Units total) by mouth 4 (four) times daily. Swish ans swallow 60 mL 0  . omeprazole (PRILOSEC) 40 MG capsule Take 1 capsule (40 mg total) by mouth daily. 30 capsule 5  . Potassium Chloride ER 20 MEQ TBCR Take 40 mEq by mouth daily. 180 tablet 3    REVIEW OF SYSTEMS:  [X]  denotes positive finding, [ ]  denotes  negative finding Cardiac  Comments:  Chest pain or chest pressure:    Shortness of breath upon exertion:    Short of breath when lying flat:    Irregular heart rhythm:        Vascular    Pain in calf, thigh, or hip brought on by ambulation: x   Pain in feet at night that wakes you up from your sleep:  x   Blood clot in your veins:    Leg swelling:           PHYSICAL EXAM: Vitals:   08/22/17 1245 08/22/17 1300 08/22/17 1315 08/22/17 1330  BP: 130/72 122/88 123/88 113/76  Pulse: (!) 57 (!) 52 (!) 55 (!) 59  Resp: 16 14 15 17   Temp:      TempSrc:      SpO2: 97% 99% 99% 100%  Weight:      Height:        GENERAL: The patient is a well-nourished female, in no acute distress. The vital signs are documented above. CARDIOVASCULAR: She has palpable radial pulses.  I do not palpate femoral or distal pulses PULMONARY: There is good air exchange  MUSCULOSKELETAL: There are no major deformities or cyanosis. NEUROLOGIC: No focal weakness or paresthesias are detected. SKIN: There are no ulcers or rashes noted.  No ulcerations on her feet PSYCHIATRIC: The patient has a normal affect.  DATA:  She did undergo a venous duplex today which was negative.  MEDICAL ISSUES: Progressive ischemic symptoms with no evidence of acute change.  It appears as though she will have to be considered for aortobifemoral bypass grafting for limiting symptoms.  Will notify Dr. Doren Custard on Monday.  We will make a decision regarding reimaging since it is been 2 years since her prior arteriogram.  We will also need cardiac evaluation.    Rosetta Posner, MD FACS [facility] Tel [phone] Pager [phone]

## 2017-08-22 NOTE — ED Triage Notes (Signed)
Pt reports right calf pain and cramping for the past few days, denies swelling or redness. Pt states she has a "blockage" between her pelvis and abdomen so she is worried about a clot in her leg. No prior hx of dvt, denies any recent trips or sob.

## 2017-08-22 NOTE — Progress Notes (Signed)
VASCULAR LAB PRELIMINARY  PRELIMINARY  PRELIMINARY  PRELIMINARY  Right lower extremity venous duplex completed.    Preliminary report:  There is no DVT or SVT noted in the right lower extremity.  Called results to Wyn Quaker, PA-C  [place], RVT 08/22/2017, 10:59 AM

## 2017-08-22 NOTE — Discharge Instructions (Addendum)
Call Dr. Nicole Cella office Monday for follow up REturn if worsening pain

## 2017-08-22 NOTE — ED Provider Notes (Addendum)
[facility] EMERGENCY DEPARTMENT Provider Note   CSN: [id number] Arrival date & time: 08/22/17  4163     History   Chief Complaint Chief Complaint  Patient presents with  . Leg Pain    HPI Brandy Stevenson is a 62 y.o. female.  HPI 62 yo female with ho pvd followed by Dr. Scot Dock for PAD with known iliac artery stenosis presents today with right calf pain and leg pain that worsened in the past 24 hours and was worse last night   She described the leg as cramping pain with right calf as the worst site with some thigh pain.  She stopped smoking 6 months ago.   She describes prior left leg pain with stable symptoms.  The right leg pain is stable She denies DVT risk factors.  Venous ultrasound performed prior to my evaluation revealed no evidence of DVT From vascular surgery chart 02/04/17 Dr. Scot Dock performed an arteriogram on 10/22/2015. This showed that her infrarenal aorta was occluded below the inferior mesenteric artery. There was reconstitution of the external iliac arteries bilaterally. She had no significant disease of the aortic arch or right or left subclavian arteries.   Dr. Scot Dock saw pt on 11-22-15. At that time she had stable claudication. She was very close to getting off cigarettes completely and Dr. Scot Dock encouraged her to try to get off cigarettes completely, and discussed a structured walking program and the importance of nutrition.  Past Medical History:  Diagnosis Date  . Arthritis    back problems to be evaluated by neurology next month  . Cancer ([place]) 2009   L, Breast, radiation and surgery.  [name] COPD (chronic obstructive pulmonary disease) ([place])   . Depression   . GERD (gastroesophageal reflux disease)   . Hyperlipemia   . Hypertension   . Peripheral vascular disease ([place]) 2013    Patient Active Problem List   Diagnosis Date Noted  . Former smoker 04/07/2017  . Primary osteoarthritis of right shoulder 04/07/2017  . Chronic midline  low back pain without sciatica 04/07/2017  . Hiatal hernia 05/01/2016  . DJD (degenerative joint disease) of cervical spine 04/17/2016  . History of breast cancer 02/18/2016  . Foot callus 02/18/2016  . Anxiety and depression 05/22/2015  . Insomnia 03/15/2015  . Unspecified vitamin D deficiency 04/08/2013  . Other and unspecified hyperlipidemia 04/08/2013  . Atherosclerotic PVD with intermittent claudication ([place]) 03/22/2013  . Essential hypertension, benign 02/23/2013  . GERD (gastroesophageal reflux disease) 02/23/2013  . COPD (chronic obstructive pulmonary disease) ([place]) 02/23/2013    Past Surgical History:  Procedure Laterality Date  . APPENDECTOMY  1980  . BREAST LUMPECTOMY Left    s/p radiation therapy   . BREAST SURGERY    . COLONOSCOPY  07/2015  . COLONOSCOPY WITH PROPOFOL N/A 06/26/2015   Procedure: COLONOSCOPY WITH PROPOFOL;  Surgeon: [name], MD;  Location: [facility];  Service: Endoscopy;  Laterality: N/A;  . PERIPHERAL VASCULAR CATHETERIZATION N/A 10/22/2015   Procedure: Abdominal Aortogram w/Lower Extremity;  Surgeon: [name], MD;  Location: [facility];  Service: Cardiovascular;  Laterality: N/A;  . TUBAL LIGATION  1991     OB History   None      Home Medications    Prior to Admission medications   Medication Sig Start Date End Date Taking? Authorizing Provider  albuterol (PROVENTIL HFA;VENTOLIN HFA) 108 (90 Base) MCG/ACT inhaler Inhale 2 puffs every 6 (six) hours as needed into the lungs for wheezing. 01/09/17  [name], MD  ammonium lactate (LAC-HYDRIN) 12 % cream Apply topically as needed for dry skin. 08/21/17   [name], MD  aspirin EC 81 MG tablet Take 1 tablet (81 mg total) by mouth daily. 10/03/16   Funches, Adriana Mccallum, MD  atorvastatin (LIPITOR) 20 MG tablet Take 1 tablet (20 mg total) by mouth daily. 10/03/16   Funches, Adriana Mccallum, MD  famotidine (PEPCID) 20 MG tablet Take 1 tablet (20 mg total) by mouth 2  (two) times daily. 03/13/17   Langston Masker B, PA-C  Fluticasone-Salmeterol (ADVAIR) 100-50 MCG/DOSE AEPB Inhale 1 puff into the lungs 2 (two) times daily. 10/03/16   Boykin Nearing, MD  gabapentin (NEURONTIN) 300 MG capsule Take by mouth 300 mg in the day as needed for pain and 300 mg at night 10/03/16   Funches, Josalyn, MD  lisinopril-hydrochlorothiazide (ZESTORETIC) 20-12.5 MG tablet Take 1 tablet by mouth daily. 10/03/16   Funches, Adriana Mccallum, MD  loperamide (IMODIUM A-D) 2 MG tablet Take 1 tablet (2 mg total) by mouth every 8 (eight) hours as needed for diarrhea or loose stools. 03/13/17   [name], MD  meloxicam (MOBIC) 15 MG tablet Take 1 tablet (15 mg total) by mouth daily. 08/20/17   [name], MD  nystatin (MYCOSTATIN) 100000 UNIT/ML suspension Take 5 mLs (500,000 Units total) by mouth 4 (four) times daily. Swish ans swallow 08/21/17   [name], MD  omeprazole (PRILOSEC) 40 MG capsule Take 1 capsule (40 mg total) by mouth daily. 10/03/16   Funches, Adriana Mccallum, MD  Potassium Chloride ER 20 MEQ TBCR Take 40 mEq by mouth daily. 07/08/17   [name], MD    Family History Family History  Problem Relation Age of Onset  . Hyperlipidemia Mother   . Hypertension Mother   . Heart disease Mother        Atrial Fib.  . Hyperlipidemia Sister   . Hypertension Sister   . Heart disease Sister        Before age 71,  CHF  . Heart attack Brother   . Hyperlipidemia Brother   . Hypertension Brother   . Heart disease Brother   . Hypertension Daughter     Social History Social History   Tobacco Use  . Smoking status: Former Smoker    Packs/day: 0.25    Years: 40.00    Pack years: 10.00    Types: Cigarettes    Last attempt to quit: 02/08/2017    Years since quitting: 0.5  . Smokeless tobacco: Never Used  . Tobacco comment: 3 cigarettes per day  Substance Use Topics  . Alcohol use: No  . Drug use: No     Allergies   Vicodin [hydrocodone-acetaminophen] and  Wellbutrin [bupropion]   Review of Systems Review of Systems   Physical Exam Updated Vital Signs BP 118/74   Pulse (!) 55   Temp 97.9 F (36.6 C) (Oral)   Resp 18   SpO2 97%   Physical Exam  Constitutional: She is oriented to person, place, and time. She appears well-developed and well-nourished. No distress.  HENT:  Head: Normocephalic and atraumatic.  Right Ear: External ear normal.  Left Ear: External ear normal.  Eyes: Pupils are equal, round, and reactive to light.  Neck: Normal range of motion.  Cardiovascular: Normal rate and regular rhythm.  Pulmonary/Chest: Effort normal and breath sounds normal.  Abdominal: Soft.  Musculoskeletal: Normal range of motion. She exhibits no edema or deformity.  Neurological: She is alert  and oriented to person, place, and time.  Skin: Skin is warm and dry. Capillary refill takes less than 2 seconds.  Psychiatric: She has a normal mood and affect.  Nursing note and vitals reviewed.    ED Treatments / Results  Labs (all labs ordered are listed, but only abnormal results are displayed) Labs Reviewed - No data to display  EKG None  Radiology No results found.  Procedures Procedures (including critical care time)  Medications Ordered in ED Medications - No data to display   Initial Impression / Assessment and Plan / ED Course  I have reviewed the triage vital signs and the nursing notes.  Pertinent labs & imaging results that were available during my care of the patient were reviewed by me and considered in my medical decision making (see chart for details).     REviewed prior studies and notes from vascular surgery Dopplered dp and pt pulses bilateral feet Discussed with Dr.Early via rn in or- he will call after surgery Patient appears stable and likely d/c with follow up to vascular Patient seen in consult by Dr. early.  Stable for discharge.  They will call her for follow-up on Monday.  Discussed return precautions  and need for close follow-up and she voices understanding.  Final Clinical Impressions(s) / ED Diagnoses   Final diagnoses:  Right leg pain  PVD (peripheral vascular disease) [facility])    ED Discharge Orders    None       Pattricia Boss, MD 08/22/17 1427    Pattricia Boss, MD 09/07/17 [phone]

## 2017-08-22 NOTE — ED Notes (Signed)
Patient transported to Ultrasound 

## 2017-08-25 ENCOUNTER — Emergency Department (HOSPITAL_COMMUNITY): Payer: Medicare HMO

## 2017-08-25 ENCOUNTER — Emergency Department (HOSPITAL_COMMUNITY)
Admission: EM | Admit: 2017-08-25 | Discharge: 2017-08-25 | Disposition: A | Discharge status: Home / Self Care | Payer: Medicare HMO | Attending: Emergency Medicine | Admitting: Emergency Medicine

## 2017-08-25 ENCOUNTER — Telehealth: Payer: Self-pay | Admitting: Vascular Surgery

## 2017-08-25 ENCOUNTER — Encounter (HOSPITAL_COMMUNITY): Payer: Self-pay | Admitting: Emergency Medicine

## 2017-08-25 ENCOUNTER — Telehealth: Payer: Self-pay

## 2017-08-25 DIAGNOSIS — R0789 Other chest pain: Secondary | ICD-10-CM | POA: Insufficient documentation

## 2017-08-25 DIAGNOSIS — Z79899 Other long term (current) drug therapy: Secondary | ICD-10-CM | POA: Diagnosis not present

## 2017-08-25 DIAGNOSIS — Z7982 Long term (current) use of aspirin: Secondary | ICD-10-CM | POA: Diagnosis not present

## 2017-08-25 DIAGNOSIS — Z853 Personal history of malignant neoplasm of breast: Secondary | ICD-10-CM | POA: Diagnosis not present

## 2017-08-25 DIAGNOSIS — I10 Essential (primary) hypertension: Secondary | ICD-10-CM | POA: Insufficient documentation

## 2017-08-25 DIAGNOSIS — Z87891 Personal history of nicotine dependence: Secondary | ICD-10-CM | POA: Insufficient documentation

## 2017-08-25 DIAGNOSIS — N644 Mastodynia: Secondary | ICD-10-CM | POA: Diagnosis not present

## 2017-08-25 DIAGNOSIS — J449 Chronic obstructive pulmonary disease, unspecified: Secondary | ICD-10-CM | POA: Diagnosis not present

## 2017-08-25 LAB — CBC
HCT: 39.5 % (ref 36.0–46.0)
Hemoglobin: 12.8 g/dL (ref 12.0–15.0)
MCH: 30.3 pg (ref 26.0–34.0)
MCHC: 32.4 g/dL (ref 30.0–36.0)
MCV: 93.6 fL (ref 78.0–100.0)
Platelets: 230 10*3/uL (ref 150–400)
RBC: 4.22 MIL/uL (ref 3.87–5.11)
RDW: 13.8 % (ref 11.5–15.5)
WBC: 6.6 10*3/uL (ref 4.0–10.5)

## 2017-08-25 LAB — BASIC METABOLIC PANEL
Anion gap: 11 (ref 5–15)
BUN: 17 mg/dL (ref 6–20)
CO2: 21 mmol/L — ABNORMAL LOW (ref 22–32)
Calcium: 9.7 mg/dL (ref 8.9–10.3)
Chloride: 105 mmol/L (ref 98–111)
Creatinine, Ser: 1.11 mg/dL — ABNORMAL HIGH (ref 0.44–1.00)
GFR calc Af Amer: >60 mL/min (ref >60)
GFR calc non Af Amer: 53 mL/min — ABNORMAL LOW (ref >60)
Glucose, Bld: 109 mg/dL — ABNORMAL HIGH (ref 70–99)
Potassium: 4 mmol/L (ref 3.5–5.1)
Sodium: 137 mmol/L (ref 135–145)

## 2017-08-25 LAB — I-STAT TROPONIN, ED: Troponin i, poc: 0 ng/mL (ref 0.00–0.08)

## 2017-08-25 MED ORDER — OXYCODONE-ACETAMINOPHEN 5-325 MG PO TABS: 1.0000 | ORAL_TABLET | Freq: Once | ORAL | Status: DC

## 2017-08-25 MED ORDER — OXYCODONE-ACETAMINOPHEN 5-325 MG PO TABS
ORAL_TABLET | ORAL | Status: AC
  Filled 2017-08-25: qty 1

## 2017-08-25 MED ORDER — KETOROLAC TROMETHAMINE 60 MG/2ML IM SOLN
30.0000 mg | Freq: Once | INTRAMUSCULAR | Status: AC
  Administered 2017-08-25: 30 mg via INTRAMUSCULAR
  Filled 2017-08-25: qty 2

## 2017-08-25 MED ORDER — CYCLOBENZAPRINE HCL 10 MG PO TABS: 5.0000 mg | ORAL_TABLET | Freq: Every day | ORAL | 0 refills | Status: AC

## 2017-08-25 MED ORDER — CYCLOBENZAPRINE HCL 10 MG PO TABS
5.0000 mg | ORAL_TABLET | Freq: Once | ORAL | Status: AC
  Administered 2017-08-25: 5 mg via ORAL
  Filled 2017-08-25: qty 1

## 2017-08-25 MED FILL — CYCLOBENZAPRINE 10 MG TAB: 10 | 10 days supply | Qty: 10 | Fill #0

## 2017-08-25 NOTE — Telephone Encounter (Signed)
sch appt spk to pt 09/16/17  345pm f/u MD

## 2017-08-25 NOTE — ED Notes (Addendum)
Consulted Dr. Leonette Monarch regarding patient and potential need for repeat testing related to continued pain. Per Dr. Leonette Monarch, continue with triage chest pain protocol. See new orders

## 2017-08-25 NOTE — ED Triage Notes (Addendum)
Pt reports pain under left breast intermittently. Pt reports pain is worse today. Reports "blockage" in abdomen? States her legs dont get any blood? Color and temperature of skin suggests otherwise. Anxious in triage.  Chart review shows PVD with aortic and com,mon iliac artery blockage, no DVTs.

## 2017-08-25 NOTE — Discharge Instructions (Signed)
You may use over-the-counter Motrin (Ibuprofen), Acetaminophen (Tylenol), topical muscle creams such as SalonPas, [employer], Bengay, etc. Please stretch, apply heat or cold.

## 2017-08-25 NOTE — Telephone Encounter (Signed)
Not needed

## 2017-08-25 NOTE — ED Provider Notes (Signed)
[facility] EMERGENCY DEPARTMENT Provider Note  CSN: [id number] Arrival date & time: 08/25/17 0045  Chief Complaint(s) Chest Pain  HPI Brandy Stevenson is a 62 y.o. female with extensive past medical history listed below including left breast cancer status post radiation and lumpectomy 10 years ago who presents to the emergency department with 6 months of intermittent left breast pain that is sporadic in nature lasting approximately 15 to 20 minutes at a time.  Pain is exacerbated or brought on by movement and palpation of the left chest wall/breast region.  Also exacerbated with strong laughing.  Typically resolves spontaneously.  Patient reports that today's episode was severe.  Pain has subsided since onset 5 hours ago.  She has noted some mild discomfort during her weight in the emergency department waiting room.  Denies any recent fevers or infections.  No cough.  Pain is nonradiating and nonexertional.  No prior history of DVT/PEs.  Currently denies any other physical complaints.  HPI    Past Medical History Past Medical History:  Diagnosis Date  . Arthritis    back problems to be evaluated by neurology next month  . Cancer ([place]) 2009   L, Breast, radiation and surgery.  [name] COPD (chronic obstructive pulmonary disease) ([place])   . Depression   . GERD (gastroesophageal reflux disease)   . Hyperlipemia   . Hypertension   . Peripheral vascular disease ([place]) 2013   Patient Active Problem List   Diagnosis Date Noted  . Former smoker 04/07/2017  . Primary osteoarthritis of right shoulder 04/07/2017  . Chronic midline low back pain without sciatica 04/07/2017  . Hiatal hernia 05/01/2016  . DJD (degenerative joint disease) of cervical spine 04/17/2016  . History of breast cancer 02/18/2016  . Foot callus 02/18/2016  . Anxiety and depression 05/22/2015  . Insomnia 03/15/2015  . Unspecified vitamin D deficiency 04/08/2013  . Other and unspecified hyperlipidemia  04/08/2013  . Atherosclerotic PVD with intermittent claudication ([place]) 03/22/2013  . Essential hypertension, benign 02/23/2013  . GERD (gastroesophageal reflux disease) 02/23/2013  . COPD (chronic obstructive pulmonary disease) ([place]) 02/23/2013   Home Medication(s) Prior to Admission medications   Medication Sig Start Date End Date Taking? Authorizing Provider  albuterol (PROVENTIL HFA;VENTOLIN HFA) 108 (90 Base) MCG/ACT inhaler Inhale 2 puffs every 6 (six) hours as needed into the lungs for wheezing. 01/09/17   [name], MD  ammonium lactate (LAC-HYDRIN) 12 % cream Apply topically as needed for dry skin. 08/21/17   [name], MD  aspirin EC 81 MG tablet Take 1 tablet (81 mg total) by mouth daily. 10/03/16   Funches, Adriana Mccallum, MD  atorvastatin (LIPITOR) 20 MG tablet Take 1 tablet (20 mg total) by mouth daily. 10/03/16   Funches, Adriana Mccallum, MD  cyclobenzaprine (FLEXERIL) 10 MG tablet Take 0.5-1 tablets (5-10 mg total) by mouth at bedtime for 10 days. 08/25/17 09/04/17  Fatima Blank, MD  famotidine (PEPCID) 20 MG tablet Take 1 tablet (20 mg total) by mouth 2 (two) times daily. 03/13/17   Langston Masker B, PA-C  Fluticasone-Salmeterol (ADVAIR) 100-50 MCG/DOSE AEPB Inhale 1 puff into the lungs 2 (two) times daily. 10/03/16   Boykin Nearing, MD  gabapentin (NEURONTIN) 300 MG capsule Take by mouth 300 mg in the day as needed for pain and 300 mg at night 10/03/16   Funches, Josalyn, MD  lisinopril-hydrochlorothiazide (ZESTORETIC) 20-12.5 MG tablet Take 1 tablet by mouth daily. 10/03/16   Boykin Nearing, MD  loperamide (IMODIUM A-D) 2 MG tablet  Take 1 tablet (2 mg total) by mouth every 8 (eight) hours as needed for diarrhea or loose stools. 03/13/17   [name], MD  meloxicam (MOBIC) 15 MG tablet Take 1 tablet (15 mg total) by mouth daily. 08/20/17   [name], MD  nystatin (MYCOSTATIN) 100000 UNIT/ML suspension Take 5 mLs (500,000 Units total) by mouth 4 (four) times daily.  Swish ans swallow 08/21/17   [name], MD  omeprazole (PRILOSEC) 40 MG capsule Take 1 capsule (40 mg total) by mouth daily. 10/03/16   Funches, Adriana Mccallum, MD  Potassium Chloride ER 20 MEQ TBCR Take 40 mEq by mouth daily. 07/08/17   [name], MD                                                                                                                                    Past Surgical History Past Surgical History:  Procedure Laterality Date  . APPENDECTOMY  1980  . BREAST LUMPECTOMY Left    s/p radiation therapy   . BREAST SURGERY    . COLONOSCOPY  07/2015  . COLONOSCOPY WITH PROPOFOL N/A 06/26/2015   Procedure: COLONOSCOPY WITH PROPOFOL;  Surgeon: [name], MD;  Location: [facility];  Service: Endoscopy;  Laterality: N/A;  . PERIPHERAL VASCULAR CATHETERIZATION N/A 10/22/2015   Procedure: Abdominal Aortogram w/Lower Extremity;  Surgeon: [name], MD;  Location: [facility] CV LAB;  Service: Cardiovascular;  Laterality: N/A;  . TUBAL LIGATION  1991   Family History Family History  Problem Relation Age of Onset  . Hyperlipidemia Mother   . Hypertension Mother   . Heart disease Mother        Atrial Fib.  . Hyperlipidemia Sister   . Hypertension Sister   . Heart disease Sister        Before age 39,  CHF  . Heart attack Brother   . Hyperlipidemia Brother   . Hypertension Brother   . Heart disease Brother   . Hypertension Daughter     Social History Social History   Tobacco Use  . Smoking status: Former Smoker    Packs/day: 0.25    Years: 40.00    Pack years: 10.00    Types: Cigarettes    Last attempt to quit: 02/08/2017    Years since quitting: 0.5  . Smokeless tobacco: Never Used  . Tobacco comment: 3 cigarettes per day  Substance Use Topics  . Alcohol use: No  . Drug use: No   Allergies Vicodin [hydrocodone-acetaminophen] and Wellbutrin [bupropion]  Review of Systems Review of Systems All other systems are reviewed and are  negative for acute change except as noted in the HPI  Physical Exam Vital Signs  I have reviewed the triage vital signs BP 118/71   Pulse (!) 57   Temp 98 F (36.7 C) (Oral)   Resp 12   Ht 5\' 4"  (1.626 m)   Wt  73.5 kg (162 lb)   SpO2 99%   BMI 27.81 kg/m   Physical Exam  Constitutional: She is oriented to person, place, and time. She appears well-developed and well-nourished. No distress.  HENT:  Head: Normocephalic and atraumatic.  Nose: Nose normal.  Eyes: Pupils are equal, round, and reactive to light. Conjunctivae and EOM are normal. Right eye exhibits no discharge. Left eye exhibits no discharge. No scleral icterus.  Neck: Normal range of motion. Neck supple.  Cardiovascular: Normal rate and regular rhythm. Exam reveals no gallop and no friction rub.  No murmur heard. Pulmonary/Chest: Effort normal and breath sounds normal. No stridor. No respiratory distress. She has no rales. She exhibits tenderness. Right breast exhibits no inverted nipple, no mass and no nipple discharge. Left breast exhibits tenderness. Left breast exhibits no mass and no nipple discharge. No breast swelling.    Abdominal: Soft. She exhibits no distension. There is no tenderness.  Musculoskeletal: She exhibits no edema or tenderness.  Neurological: She is alert and oriented to person, place, and time.  Skin: Skin is warm and dry. No rash noted. She is not diaphoretic. No erythema.  Psychiatric: She has a normal mood and affect.  Vitals reviewed.   ED Results and Treatments Labs (all labs ordered are listed, but only abnormal results are displayed) Labs Reviewed  BASIC METABOLIC PANEL - Abnormal; Notable for the following components:      Result Value   CO2 21 (*)    Glucose, Bld 109 (*)    Creatinine, Ser 1.11 (*)    GFR calc non Af Amer 53 (*)    All other components within normal limits  CBC  I-STAT TROPONIN, ED                                                                                                                          EKG  EKG Interpretation  Date/Time:  Tuesday August 25 2017 00:48:51 EDT Ventricular Rate:  84 PR Interval:  146 QRS Duration: 70 QT Interval:  348 QTC Calculation: 411 R Axis:   -17 Text Interpretation:  Normal sinus rhythm Normal ECG No significant change since last tracing Confirmed by Addison Lank [phone]) on 08/25/2017 4:22:17 AM      Radiology Dg Chest 2 View  Result Date: 08/25/2017 CLINICAL DATA:  Pain beneath the left breast intermittently, worse today. EXAM: CHEST - 2 VIEW COMPARISON:  07/29/2017 FINDINGS: The heart size and mediastinal contours are within normal limits. Moderate aortic atherosclerosis at the arch without aneurysm. Both lungs are clear. The visualized skeletal structures are unremarkable. IMPRESSION: No active cardiopulmonary disease.  Aortic atherosclerosis. Electronically Signed   By: Ashley Royalty M.D.   On: 08/25/2017 02:08   Pertinent labs & imaging results that were available during my care of the patient were reviewed by me and considered in my medical decision making (see chart for details).  Medications Ordered in ED Medications  oxyCODONE-acetaminophen (PERCOCET/ROXICET) 5-325 MG per tablet 1 tablet (  1 tablet Oral Refused 08/25/17 0129)  ketorolac (TORADOL) injection 30 mg (30 mg Intramuscular Given 08/25/17 0450)  cyclobenzaprine (FLEXERIL) tablet 5 mg (5 mg Oral Given 08/25/17 0449)                                                                                                                                    Procedures Procedures  (including critical care time)  Medical Decision Making / ED Course I have reviewed the nursing notes for this encounter and the patient's prior records (if available in EHR or on provided paperwork).    Presentation is most consistent with chest wall pain.  She denies any trauma.  EKG without acute ischemic changes or evidence of pericarditis.  Troponin obtained in triage was  negative.  Her symptoms are highly atypical for ACS and do not believe that additional cardiac markers are necessary.  Low suspicion for pulmonary embolism.  Presentation not classic for aortic dissection or esophageal perforation. Chest x-ray without evidence suggestive of pneumonia, pneumothorax, pneumomediastinum.  No abnormal contour of the mediastinum to suggest dissection. No evidence of acute injuries.  Pain reproduced with palpation on exam.  Provided with toradol, flexeril, and heat pad. Improved pain with treatment.  The patient appears reasonably screened and/or stabilized for discharge and I doubt any other medical condition or other [facility] requiring further screening, evaluation, or treatment in the ED at this time prior to discharge.  The patient is safe for discharge with strict return precautions.     Final Clinical Impression(s) / ED Diagnoses Final diagnoses:  Chest wall pain    Disposition: Discharge  Condition: Good  I have discussed the results, Dx and Tx plan with the patient who expressed understanding and agree(s) with the plan. Discharge instructions discussed at great length. The patient was given strict return precautions who verbalized understanding of the instructions. No further questions at time of discharge.    ED Discharge Orders        Ordered    cyclobenzaprine (FLEXERIL) 10 MG tablet  Daily at bedtime     08/25/17 7829       Follow Up: [name], MD [place] [address] [phone]  Schedule an appointment as soon as possible for a visit  As needed     This chart was dictated using voice recognition software.  Despite best efforts to proofread,  errors can occur which can change the documentation meaning.   Fatima Blank, MD 08/25/17 [phone]

## 2017-08-31 HISTORY — PX: TRANSTHORACIC ECHOCARDIOGRAM: SHX275

## 2017-09-01 NOTE — Progress Notes (Signed)
Cardiology Office Note:    Date:  09/02/2017   ID:  Brandy Stevenson, DOB 01-Dec-1955, MRN [id number]  PCP:  [name], MD  Cardiologist:  Glenetta Hew, MD   Referring MD: [name], MD   Chief Complaint  Patient presents with  . New Patient (Initial Visit)    surgical clearance    History of Present Illness:    Brandy Stevenson is a 62 y.o. female with a hx of HTN, PVD with intermittent claudication, COPD, GERD, former smoker, and anxiety and depression. She is s/p radiation and lumpectomy for breast cancer 10 years ago.  She was recently seen in the ED for chest wall pain. She reported 6 months of intermittent left breast pain that is exacerbated by movement and palpation on exam. Troponin was negative and EKG was negative for acute ischemia. She was discharged with close follow up.   She presents today for new patient visit for evaluation of chest pain and for cardiac clearance for vascular surgery. She has been followed by vascular surgery for PAD and activity limiting claudication. Dr. Scot Dock is now recommending open aortofemoral bypass grafting and has requested cardiac clearance.    On my interview, she complains of left breast/chest wall pain that hurts with certain movement, laughing, and deep coughing. I am able to reproduce the pain with palpation on exam. She denies exertional chest pain, shortness of breath, and palpitations. She is unable to complete 4 METS due to her claudication. It does not look like she has had an echocardiogram or ischemic evaluation in the past.  She has a family history of heart disease in her mother, sister, and brother. Brother died of a heart attack at age 40.  She has risk factors for CAD, including HTN, HLD, PAD, family history, and 10 pack year smoking history. She has not had an ischemic evaluation or echocardiogram. She does not describe symptoms consistent with angina.    Past Medical History:  Diagnosis Date  . Arthritis      back problems to be evaluated by neurology next month  . Cancer ([place]) 2009   L, Breast, radiation and surgery.  [name] COPD (chronic obstructive pulmonary disease) ([place])   . Depression   . GERD (gastroesophageal reflux disease)   . Hyperlipemia   . Hypertension   . Peripheral vascular disease ([place]) 2013    Past Surgical History:  Procedure Laterality Date  . APPENDECTOMY  1980  . BREAST LUMPECTOMY Left    s/p radiation therapy   . BREAST SURGERY    . COLONOSCOPY  07/2015  . COLONOSCOPY WITH PROPOFOL N/A 06/26/2015   Procedure: COLONOSCOPY WITH PROPOFOL;  Surgeon: [name], MD;  Location: [facility];  Service: Endoscopy;  Laterality: N/A;  . PERIPHERAL VASCULAR CATHETERIZATION N/A 10/22/2015   Procedure: Abdominal Aortogram w/Lower Extremity;  Surgeon: [name], MD;  Location: [facility];  Service: Cardiovascular;  Laterality: N/A;  . TUBAL LIGATION  1991    Current Medications: Current Meds  Medication Sig  . albuterol (PROVENTIL HFA;VENTOLIN HFA) 108 (90 Base) MCG/ACT inhaler Inhale 2 puffs every 6 (six) hours as needed into the lungs for wheezing.  [name] ammonium lactate (LAC-HYDRIN) 12 % cream Apply topically as needed for dry skin. (Patient taking differently: Apply 1 g topically daily as needed for dry skin. )  . aspirin EC 81 MG tablet Take 1 tablet (81 mg total) by mouth daily.  [name] atorvastatin (LIPITOR) 20 MG tablet Take 1 tablet (20 mg  total) by mouth daily.  . cyclobenzaprine (FLEXERIL) 10 MG tablet Take 0.5-1 tablets (5-10 mg total) by mouth at bedtime for 10 days.  . famotidine (PEPCID) 20 MG tablet Take 1 tablet (20 mg total) by mouth 2 (two) times daily.  . Fluticasone-Salmeterol (ADVAIR) 100-50 MCG/DOSE AEPB Inhale 1 puff into the lungs 2 (two) times daily.  [name] gabapentin (NEURONTIN) 300 MG capsule Take by mouth 300 mg in the day as needed for pain and 300 mg at night  . lisinopril-hydrochlorothiazide (ZESTORETIC) 20-12.5 MG tablet Take 1 tablet  by mouth daily.  [name] loperamide (IMODIUM A-D) 2 MG tablet Take 1 tablet (2 mg total) by mouth every 8 (eight) hours as needed for diarrhea or loose stools.  . meloxicam (MOBIC) 15 MG tablet Take 1 tablet (15 mg total) by mouth daily.  [name] nystatin (MYCOSTATIN) 100000 UNIT/ML suspension Take 5 mLs (500,000 Units total) by mouth 4 (four) times daily. Swish ans swallow  . omeprazole (PRILOSEC) 40 MG capsule Take 1 capsule (40 mg total) by mouth daily.  . Potassium Chloride ER 20 MEQ TBCR Take 40 mEq by mouth daily.     Allergies:   Vicodin [hydrocodone-acetaminophen] and Wellbutrin [bupropion]   Social History   Socioeconomic History  . Marital status: Married    Spouse name: Not on file  . Number of children: Not on file  . Years of education: Not on file  . Highest education level: Not on file  Occupational History  . Not on file  Social Needs  . Financial resource strain: Not on file  . Food insecurity:    Worry: Not on file    Inability: Not on file  . Transportation needs:    Medical: Not on file    Non-medical: Not on file  Tobacco Use  . Smoking status: Former Smoker    Packs/day: 0.25    Years: 40.00    Pack years: 10.00    Types: Cigarettes    Last attempt to quit: 02/08/2017    Years since quitting: 0.5  . Smokeless tobacco: Never Used  . Tobacco comment: 3 cigarettes per day  Substance and Sexual Activity  . Alcohol use: No  . Drug use: No  . Sexual activity: Not Currently  Lifestyle  . Physical activity:    Days per week: Not on file    Minutes per session: Not on file  . Stress: Not on file  Relationships  . Social connections:    Talks on phone: Not on file    Gets together: Not on file    Attends religious service: Not on file    Active member of club or organization: Not on file    Attends meetings of clubs or organizations: Not on file    Relationship status: Not on file  Other Topics Concern  . Not on file  Social History Narrative  . Not on file      Family History: The patient's family history includes Heart attack in her brother; Heart disease in her brother, mother, and sister; Hyperlipidemia in her brother, mother, and sister; Hypertension in her brother, daughter, mother, and sister.  ROS:   Please see the history of present illness.     All other systems reviewed and are negative.  EKGs/Labs/Other Studies Reviewed:    The following studies were reviewed today:  none  EKG:  EKG is ordered today.  The ekg ordered today demonstrates sinus rhythm, no ischemia  Recent Labs: 10/03/2016: ALT 28 08/25/2017: BUN 17;  Creatinine, Ser 1.11; Hemoglobin 12.8; Platelets 230; Potassium 4.0; Sodium 137  Recent Lipid Panel    Component Value Date/Time   CHOL 161 10/03/2016 0954   TRIG 130 10/03/2016 0954   HDL 51 10/03/2016 0954   CHOLHDL 3.2 10/03/2016 0954   CHOLHDL 2.8 07/27/2014 1207   VLDL 32 07/27/2014 1207   LDLCALC 84 10/03/2016 0954    Physical Exam:    VS:  BP 122/72   Pulse 69   Ht 5\' 4"  (1.626 m)   Wt 167 lb 3.2 oz (75.8 kg)   BMI 28.70 kg/m     Wt Readings from Last 3 Encounters:  09/02/17 167 lb 3.2 oz (75.8 kg)  08/25/17 162 lb (73.5 kg)  08/22/17 162 lb (73.5 kg)     GEN: obese AA female in no acute distress HEENT: Normal NECK: No JVD; No carotid bruits LYMPHATICS: No lymphadenopathy CARDIAC: RRR, no murmurs, rubs, gallops RESPIRATORY:  Clear to auscultation without rales, wheezing or rhonchi  ABDOMEN: Soft, non-tender, non-distended MUSCULOSKELETAL:  No edema; No deformity  SKIN: Warm and dry NEUROLOGIC:  Alert and oriented x 3 PSYCHIATRIC:  Normal affect   ASSESSMENT:    1. Preoperative cardiovascular examination   2. Precordial chest pain   3. History of breast cancer   4. Essential hypertension, benign   5. Atherosclerotic PVD with intermittent claudication ([place])   6. Former smoker    PLAN:    In order of problems listed above:  Preoperative cardiovascular examination, chest  pain This patient has not had a prior ischemic evaluation. She describes chest wall pain that is consistent with MSK/chostochondritis on the side that received radiation for breast cancer. She has risk factors for CAD, including HTN, HLD, PAD, family history, and smoking history. She doesn't carry a diagnosis for CKD, but has mildly elevated creatinine. I will obtain and echocardiogram. If no wall motion abnormality, I will not pursue ischemic evaluation prior to surgery in the absence of symptoms. I certainly do not want to postpone this needed surgery if she requires DAPT following a heart cath. If echocardiogram is normal, I would recommend proceeding with surgery without further ischemic evaluation. She recognizes that given her co-morbidities, she is at a somewhat higher risk for a high risk procedure. If the echo is abnormal (i.e., wall motion abnormality).  Case was discussed with Dr. Ellyn Hack (DOD).  Essential hypertension, benign Pressure is well-controlled. No medication changes.  Atherosclerotic PVD with intermittent claudication [facility]) Planning aortofemoral bypass with VVS.  History of breast cancer S/P lumpectomy and radiation on left breast.   Former smoker Congratulated on continued cessation.    We will see her back in 2 months for decision regarding ischemic evaluation after she has recovered from her vascular surgery.   Medication Adjustments/Labs and Tests Ordered: Current medicines are reviewed at length with the patient today.  Concerns regarding medicines are outlined above.  Orders Placed This Encounter  Procedures  . EKG 12-Lead  . ECHOCARDIOGRAM COMPLETE   No orders of the defined types were placed in this encounter.   Signed, Ledora Bottcher, PA  09/02/2017 3:54 PM    [facility]

## 2017-09-02 ENCOUNTER — Ambulatory Visit: Payer: Medicare HMO | Admitting: Physician Assistant

## 2017-09-02 ENCOUNTER — Encounter: Payer: Self-pay | Admitting: Physician Assistant

## 2017-09-02 VITALS — BP 122/72 | HR 69 | Ht 64.0 in | Wt 167.2 lb

## 2017-09-02 DIAGNOSIS — R072 Precordial pain: Secondary | ICD-10-CM

## 2017-09-02 DIAGNOSIS — Z853 Personal history of malignant neoplasm of breast: Secondary | ICD-10-CM | POA: Diagnosis not present

## 2017-09-02 DIAGNOSIS — I10 Essential (primary) hypertension: Secondary | ICD-10-CM | POA: Diagnosis not present

## 2017-09-02 DIAGNOSIS — Z87891 Personal history of nicotine dependence: Secondary | ICD-10-CM

## 2017-09-02 DIAGNOSIS — Z01810 Encounter for preprocedural cardiovascular examination: Secondary | ICD-10-CM

## 2017-09-02 DIAGNOSIS — I70219 Atherosclerosis of native arteries of extremities with intermittent claudication, unspecified extremity: Secondary | ICD-10-CM

## 2017-09-02 NOTE — Patient Instructions (Signed)
Medication Instructions:  Your physician recommends that you continue on your current medications as directed. Please refer to the Current Medication list given to you today.  Labwork: NONE   Testing/Procedures: Your physician has requested that you have an echocardiogram. Echocardiography is a painless test that uses sound waves to create images of your heart. It provides your doctor with information about the size and shape of your heart and how well your heart's chambers and valves are working. This procedure takes approximately one hour. There are no restrictions for this procedure. [place]: Your physician recommends that you schedule a follow-up appointment in: 2 MONTHS WITH DR [facility]   Any Other Special Instructions Will Be Listed Below (If Applicable). If you need a refill on your cardiac medications before your next appointment, please call your pharmacy.

## 2017-09-07 ENCOUNTER — Ambulatory Visit (HOSPITAL_COMMUNITY): Payer: Medicare HMO | Attending: Cardiology

## 2017-09-07 ENCOUNTER — Other Ambulatory Visit: Payer: Self-pay

## 2017-09-07 DIAGNOSIS — E785 Hyperlipidemia, unspecified: Secondary | ICD-10-CM | POA: Diagnosis not present

## 2017-09-07 DIAGNOSIS — Z01810 Encounter for preprocedural cardiovascular examination: Secondary | ICD-10-CM | POA: Diagnosis not present

## 2017-09-07 DIAGNOSIS — Z853 Personal history of malignant neoplasm of breast: Secondary | ICD-10-CM | POA: Diagnosis not present

## 2017-09-07 DIAGNOSIS — I10 Essential (primary) hypertension: Secondary | ICD-10-CM | POA: Diagnosis not present

## 2017-09-07 DIAGNOSIS — R072 Precordial pain: Secondary | ICD-10-CM | POA: Diagnosis not present

## 2017-09-07 DIAGNOSIS — I70219 Atherosclerosis of native arteries of extremities with intermittent claudication, unspecified extremity: Secondary | ICD-10-CM | POA: Insufficient documentation

## 2017-09-07 DIAGNOSIS — Z87891 Personal history of nicotine dependence: Secondary | ICD-10-CM | POA: Insufficient documentation

## 2017-09-07 DIAGNOSIS — I493 Ventricular premature depolarization: Secondary | ICD-10-CM | POA: Insufficient documentation

## 2017-09-08 ENCOUNTER — Telehealth: Payer: Self-pay | Admitting: Physician Assistant

## 2017-09-08 NOTE — Telephone Encounter (Signed)
   Primary Cardiologist: Glenetta Hew, MD  Chart reviewed as part of pre-operative protocol coverage. Patient was contacted 09/08/2017 in reference to pre-operative risk assessment for pending surgery as outlined below.  Alondria Mousseau was last seen on  09/02/17 by Fabian Sharp PAC.  Since that day, Lilibeth Opie has done well. She underwent echocardiogram that showed no wall motion abnormality. Case discussed at that visit with Dr. Ellyn Hack. She is cleared for vascular surgery without further cardiac workup.  Therefore, based on ACC/AHA guidelines, the patient would be at acceptable risk for the planned procedure without further /cardiovascular testing.   I will route this recommendation to the requesting party via Epic fax function and remove from pre-op pool.  Please call with questions.  Tami Lin Duke, PA 09/08/2017, 8:25 AM

## 2017-09-16 ENCOUNTER — Encounter: Payer: Self-pay | Admitting: Vascular Surgery

## 2017-09-16 ENCOUNTER — Ambulatory Visit (INDEPENDENT_AMBULATORY_CARE_PROVIDER_SITE_OTHER): Payer: Medicare HMO | Admitting: Vascular Surgery

## 2017-09-16 ENCOUNTER — Other Ambulatory Visit: Payer: Self-pay

## 2017-09-16 VITALS — BP 143/95 | HR 64 | Temp 97.5°F | Resp 14 | Ht 64.0 in | Wt 162.0 lb

## 2017-09-16 DIAGNOSIS — I7409 Other arterial embolism and thrombosis of abdominal aorta: Secondary | ICD-10-CM

## 2017-09-16 DIAGNOSIS — I739 Peripheral vascular disease, unspecified: Secondary | ICD-10-CM | POA: Diagnosis not present

## 2017-09-16 NOTE — Progress Notes (Signed)
Patient name: Brandy Stevenson MRN: [id number] DOB: April 21, 1955 Sex: female   REASON FOR CONSULT:    Follow-up of aortic occlusion  HPI:   Brandy Stevenson is a pleasant 62 y.o. female, who had previously been followed by Dr. Kellie Simmering with peripheral vascular disease.  She was seen in our office by the nurse practitioner and set up for an arteriogram which I performed on 10/22/2015.  She underwent an arch arteriogram which showed no significant occlusive disease of the innominate artery, right subclavian artery, or left subclavian artery.  She had no significant renal artery disease.  The infrarenal aorta was occluded below the level of the inferior mesenteric artery.  The common iliac arteries were occluded.  There is reconstitution of the external iliac arteries via hypogastric collaterals.  In addition she had some disease of her distal popliteal on the right and some tibial disease.  I felt that if her symptoms were stable we would continue to follow this conservatively.  Otherwise her options would be aortofemoral bypass grafting versus asked low bifemoral bypass grafting.  She was a smoker at that time.  She apparently had been seen with rest pain in the emergency department and was told to come for follow-up with me.  She was also set up for preoperative cardiac evaluation.  I reviewed her note from 09/02/2017.  She was set up for an echo which was done on 09/07/2017.  This showed systolic function was normal with an ejection fraction of 60 to 65%.   On my history, she states that she has had bilateral calf claudication which extends up to her thigh and hips at times.  Her symptoms are more significant on the left side.  The symptoms have been gradually progressive.  I really do not get any history of rest pain in that she tells me she elevates her legs on a pillow at night when her feet hurt.  She has had no history of nonhealing wounds.  She has had some occasional issues where her leg falls asleep  at night when she sleeps in a certain position.  She denies any significant back pain.  She quit tobacco in December 2018.  She does have a history of COPD and uses inhalers.  She apparently has had x-rays which show she does have some degenerative disc disease.  Past Medical History:  Diagnosis Date  . Arthritis    back problems to be evaluated by neurology next month  . Cancer ([place]) 2009   L, Breast, radiation and surgery.  [name] COPD (chronic obstructive pulmonary disease) ([place])   . Depression   . GERD (gastroesophageal reflux disease)   . Hyperlipemia   . Hypertension   . Peripheral vascular disease ([place]) 2013    Family History  Problem Relation Age of Onset  . Hyperlipidemia Mother   . Hypertension Mother   . Heart disease Mother        Atrial Fib.  . Hyperlipidemia Sister   . Hypertension Sister   . Heart disease Sister        Before age 13,  CHF  . Heart attack Brother   . Hyperlipidemia Brother   . Hypertension Brother   . Heart disease Brother   . Hypertension Daughter     SOCIAL HISTORY: She quit smoking in December 2018. Social History   Socioeconomic History  . Marital status: Married    Spouse name: Not on file  . Number of children: Not on file  . Years of education:  Not on file  . Highest education level: Not on file  Occupational History  . Not on file  Social Needs  . Financial resource strain: Not on file  . Food insecurity:    Worry: Not on file    Inability: Not on file  . Transportation needs:    Medical: Not on file    Non-medical: Not on file  Tobacco Use  . Smoking status: Former Smoker    Packs/day: 0.25    Years: 40.00    Pack years: 10.00    Types: Cigarettes    Last attempt to quit: 02/08/2017    Years since quitting: 0.6  . Smokeless tobacco: Never Used  . Tobacco comment: 3 cigarettes per day  Substance and Sexual Activity  . Alcohol use: No  . Drug use: No  . Sexual activity: Not Currently  Lifestyle  . Physical  activity:    Days per week: Not on file    Minutes per session: Not on file  . Stress: Not on file  Relationships  . Social connections:    Talks on phone: Not on file    Gets together: Not on file    Attends religious service: Not on file    Active member of club or organization: Not on file    Attends meetings of clubs or organizations: Not on file    Relationship status: Not on file  . Intimate partner violence:    Fear of current or ex partner: Not on file    Emotionally abused: Not on file    Physically abused: Not on file    Forced sexual activity: Not on file  Other Topics Concern  . Not on file  Social History Narrative  . Not on file    Allergies  Allergen Reactions  . Vicodin [Hydrocodone-Acetaminophen] Nausea And Vomiting and Other (See Comments)    Sweating  . Wellbutrin [Bupropion]     Made me feel bad    Current Outpatient Medications  Medication Sig Dispense Refill  . albuterol (PROVENTIL HFA;VENTOLIN HFA) 108 (90 Base) MCG/ACT inhaler Inhale 2 puffs every 6 (six) hours as needed into the lungs for wheezing. 3 Inhaler 4  . aspirin EC 81 MG tablet Take 1 tablet (81 mg total) by mouth daily. 90 tablet 3  . atorvastatin (LIPITOR) 20 MG tablet Take 1 tablet (20 mg total) by mouth daily. 90 tablet 3  . famotidine (PEPCID) 20 MG tablet Take 1 tablet (20 mg total) by mouth 2 (two) times daily. 30 tablet 0  . Fluticasone-Salmeterol (ADVAIR) 100-50 MCG/DOSE AEPB Inhale 1 puff into the lungs 2 (two) times daily. 180 each 3  . gabapentin (NEURONTIN) 300 MG capsule Take by mouth 300 mg in the day as needed for pain and 300 mg at night 180 capsule 3  . lisinopril-hydrochlorothiazide (ZESTORETIC) 20-12.5 MG tablet Take 1 tablet by mouth daily. 90 tablet 3  . loperamide (IMODIUM A-D) 2 MG tablet Take 1 tablet (2 mg total) by mouth every 8 (eight) hours as needed for diarrhea or loose stools. 12 tablet 0  . meloxicam (MOBIC) 15 MG tablet Take 1 tablet (15 mg total) by mouth  daily. 30 tablet 6  . nystatin (MYCOSTATIN) 100000 UNIT/ML suspension Take 5 mLs (500,000 Units total) by mouth 4 (four) times daily. Swish ans swallow 60 mL 0  . omeprazole (PRILOSEC) 40 MG capsule Take 1 capsule (40 mg total) by mouth daily. 30 capsule 5  . Potassium Chloride ER 20 MEQ TBCR Take  40 mEq by mouth daily. 180 tablet 3  . ammonium lactate (LAC-HYDRIN) 12 % cream Apply topically as needed for dry skin. (Patient not taking: Reported on 09/16/2017) 385 g 0   No current facility-administered medications for this visit.     REVIEW OF SYSTEMS:  [X]  denotes positive finding, [ ]  denotes negative finding Cardiac  Comments:  Chest pain or chest pressure:    Shortness of breath upon exertion:    Short of breath when lying flat:    Irregular heart rhythm:        Vascular    Pain in calf, thigh, or hip brought on by ambulation: x   Pain in feet at night that wakes you up from your sleep:  x   Blood clot in your veins:    Leg swelling:  x       Pulmonary    Oxygen at home:    Productive cough:     Wheezing:         Neurologic    Sudden weakness in arms or legs:     Sudden numbness in arms or legs:     Sudden onset of difficulty speaking or slurred speech:    Temporary loss of vision in one eye:     Problems with dizziness:         Gastrointestinal    Blood in stool:     Vomited blood:         Genitourinary    Burning when urinating:     Blood in urine:        Psychiatric    Major depression:         Hematologic    Bleeding problems:    Problems with blood clotting too easily:        Skin    Rashes or ulcers:        Constitutional    Fever or chills:     PHYSICAL EXAM:   Vitals:   09/16/17 1559 09/16/17 1606  BP: (!) 142/88 (!) 143/95  Pulse: 65 64  Resp: 14   Temp: (!) 97.5 F (36.4 C)   TempSrc: Oral   Weight: 162 lb (73.5 kg)   Height: 5\' 4"  (1.626 m)     GENERAL: The patient is a well-nourished female, in no acute distress. The vital signs are  documented above. CARDIAC: There is a regular rate and rhythm.  VASCULAR: I do not detect carotid bruits. I cannot palpate femoral, popliteal, or pedal pulses. She has no significant lower extremity swelling. PULMONARY: There is good air exchange bilaterally without wheezing or rales. ABDOMEN: Soft and non-tender with normal pitched bowel sounds.  MUSCULOSKELETAL: There are no major deformities or cyanosis. NEUROLOGIC: No focal weakness or paresthesias are detected. SKIN: There are no ulcers or rashes noted. PSYCHIATRIC: The patient has a normal affect.  DATA:    I reviewed her arteriogram from 2017 which showed an occlusion of the infrarenal aorta below the level of the inferior mesenteric artery.  The common iliac arteries were also occluded and there was reconstitution of the external iliac arteries via hypogastric collaterals.  There was no significant disease of the arch or innominate, right subclavian, or left subclavian arteries.  LABS: Her GFR is greater than 60.  MEDICAL ISSUES:   AORTOILIAC OCCLUSIVE DISEASE: This patient has had a chronic aortic occlusion and I am not sure there is symptoms have changed that much in the last 2 years.  I really do not get any  history of rest pain or nonhealing ulcers.  Fortunately she is quit smoking.  The main concern is been her legs giving out on her some and for this reason I think we need to pursue this further.  I have recommended that we obtain a CT angiogram with runoff and also follow-up ABIs.  I will then see her back after this is done to discuss these results.  If her symptoms are stable I think we could continue with a conservative approach.  If her symptoms progress or options would be aortofemoral bypass grafting versus axillobifemoral bypass grafting.  She has undergone a preoperative cardiac evaluation and is cleared for surgery if we do a left that approach.  Deitra Mayo [facility]  [phone]

## 2017-09-16 NOTE — Progress Notes (Signed)
Vitals:   09/16/17 1559  BP: (!) 142/88  Pulse: 65  Resp: 14  Temp: (!) 97.5 F (36.4 C)  TempSrc: Oral  Weight: 162 lb (73.5 kg)  Height: 5\' 4"  (1.626 m)

## 2017-09-17 ENCOUNTER — Other Ambulatory Visit: Payer: Self-pay

## 2017-09-17 DIAGNOSIS — I739 Peripheral vascular disease, unspecified: Secondary | ICD-10-CM

## 2017-09-17 DIAGNOSIS — I7409 Other arterial embolism and thrombosis of abdominal aorta: Secondary | ICD-10-CM

## 2017-09-17 DIAGNOSIS — I70213 Atherosclerosis of native arteries of extremities with intermittent claudication, bilateral legs: Secondary | ICD-10-CM

## 2017-09-29 ENCOUNTER — Emergency Department (HOSPITAL_COMMUNITY)
Admission: EM | Admit: 2017-09-29 | Discharge: 2017-09-29 | Disposition: A | Discharge status: Home / Self Care | Payer: Medicare HMO | Attending: Emergency Medicine | Admitting: Emergency Medicine

## 2017-09-29 ENCOUNTER — Encounter (HOSPITAL_COMMUNITY): Payer: Self-pay

## 2017-09-29 DIAGNOSIS — I10 Essential (primary) hypertension: Secondary | ICD-10-CM | POA: Diagnosis not present

## 2017-09-29 DIAGNOSIS — Z7982 Long term (current) use of aspirin: Secondary | ICD-10-CM | POA: Diagnosis not present

## 2017-09-29 DIAGNOSIS — M79606 Pain in leg, unspecified: Secondary | ICD-10-CM | POA: Diagnosis not present

## 2017-09-29 DIAGNOSIS — R252 Cramp and spasm: Secondary | ICD-10-CM

## 2017-09-29 DIAGNOSIS — Z87891 Personal history of nicotine dependence: Secondary | ICD-10-CM | POA: Insufficient documentation

## 2017-09-29 DIAGNOSIS — G4762 Sleep related leg cramps: Secondary | ICD-10-CM | POA: Diagnosis not present

## 2017-09-29 DIAGNOSIS — E876 Hypokalemia: Secondary | ICD-10-CM | POA: Diagnosis not present

## 2017-09-29 DIAGNOSIS — Z79899 Other long term (current) drug therapy: Secondary | ICD-10-CM | POA: Insufficient documentation

## 2017-09-29 DIAGNOSIS — R5381 Other malaise: Secondary | ICD-10-CM | POA: Diagnosis not present

## 2017-09-29 DIAGNOSIS — J449 Chronic obstructive pulmonary disease, unspecified: Secondary | ICD-10-CM | POA: Diagnosis not present

## 2017-09-29 DIAGNOSIS — M79604 Pain in right leg: Secondary | ICD-10-CM | POA: Diagnosis not present

## 2017-09-29 LAB — I-STAT CHEM 8, ED
BUN: 22 mg/dL — ABNORMAL HIGH (ref 6–20)
Calcium, Ion: 1.2 mmol/L (ref 1.15–1.40)
Chloride: 102 mmol/L (ref 98–111)
Creatinine, Ser: 0.9 mg/dL (ref 0.44–1.00)
Glucose, Bld: 94 mg/dL (ref 70–99)
HCT: 36 % (ref 36.0–46.0)
Hemoglobin: 12.2 g/dL (ref 12.0–15.0)
Potassium: 3.3 mmol/L — ABNORMAL LOW (ref 3.5–5.1)
Sodium: 139 mmol/L (ref 135–145)
TCO2: 26 mmol/L (ref 22–32)

## 2017-09-29 MED ORDER — POTASSIUM CHLORIDE CRYS ER 20 MEQ PO TBCR
40.0000 meq | EXTENDED_RELEASE_TABLET | Freq: Once | ORAL | Status: AC
  Administered 2017-09-29: 40 meq via ORAL
  Filled 2017-09-29: qty 2

## 2017-09-29 MED ORDER — DIAZEPAM 5 MG PO TABS
5.0000 mg | ORAL_TABLET | Freq: Once | ORAL | Status: AC
  Administered 2017-09-29: 5 mg via ORAL
  Filled 2017-09-29: qty 1

## 2017-09-29 MED ORDER — CYCLOBENZAPRINE HCL 5 MG PO TABS: 5.0000 mg | ORAL_TABLET | Freq: Two times a day (BID) | ORAL | 0 refills | Status: DC | PRN

## 2017-09-29 NOTE — Discharge Instructions (Addendum)
You were seen today for right leg cramping.  This may be related to mildly low potassium.  Increase potassium in your food intake.  Make sure to eat plenty of bananas and drink electrolyte rich fluids.  You will be given a muscle relaxer if symptoms recur.

## 2017-09-29 NOTE — ED Notes (Signed)
PT states understanding of care given, follow up care, and medication prescribed. PT ambulated from ED to car with a steady gait. 

## 2017-09-29 NOTE — ED Notes (Signed)
Cramps have stopped at this time

## 2017-09-29 NOTE — ED Triage Notes (Signed)
Pt coming from home by ems. Pt states that she has PAD and has dr Bobette Mo as vascular dr. Pt states her right leg has been cramping for the last two hours and has not stopped pt states pain is 5/10. Cramps come and go.

## 2017-09-29 NOTE — ED Provider Notes (Signed)
[facility] Provider Note   CSN: [id number] Arrival date & time: 09/29/17  8413     History   Chief Complaint Chief Complaint  Patient presents with  . Leg Pain    HPI Brandy Stevenson is a 62 y.o. female.  HPI  This is a 62 year old female with a history of arthritis, COPD, hypertension, peripheral vascular disease who presents with right leg cramping.  Patient reports that she had acute onset of right thigh cramping while in bed tonight.  Cramping is worse with flexion and extension of her foot.  She states that she has "bad veins" and is unsure if this is related.  Rates her pain at 8 out of 10.  She describes it as spasm.  She denies any swelling of the leg or color changes.  She denies any numbness or tingling in the leg.  Denies any injury.  Past Medical History:  Diagnosis Date  . Arthritis    back problems to be evaluated by neurology next month  . Cancer ([place]) 2009   L, Breast, radiation and surgery.  [name] COPD (chronic obstructive pulmonary disease) ([place])   . Depression   . GERD (gastroesophageal reflux disease)   . Hyperlipemia   . Hypertension   . Peripheral vascular disease ([place]) 2013    Patient Active Problem List   Diagnosis Date Noted  . Former smoker 04/07/2017  . Primary osteoarthritis of right shoulder 04/07/2017  . Chronic midline low back pain without sciatica 04/07/2017  . Hiatal hernia 05/01/2016  . DJD (degenerative joint disease) of cervical spine 04/17/2016  . History of breast cancer 02/18/2016  . Foot callus 02/18/2016  . Anxiety and depression 05/22/2015  . Insomnia 03/15/2015  . Unspecified vitamin D deficiency 04/08/2013  . Other and unspecified hyperlipidemia 04/08/2013  . Atherosclerotic PVD with intermittent claudication ([place]) 03/22/2013  . Essential hypertension, benign 02/23/2013  . GERD (gastroesophageal reflux disease) 02/23/2013  . COPD (chronic obstructive pulmonary disease) ([place])  02/23/2013    Past Surgical History:  Procedure Laterality Date  . APPENDECTOMY  1980  . BREAST LUMPECTOMY Left    s/p radiation therapy   . BREAST SURGERY    . COLONOSCOPY  07/2015  . COLONOSCOPY WITH PROPOFOL N/A 06/26/2015   Procedure: COLONOSCOPY WITH PROPOFOL;  Surgeon: [name], MD;  Location: [facility];  Service: Endoscopy;  Laterality: N/A;  . PERIPHERAL VASCULAR CATHETERIZATION N/A 10/22/2015   Procedure: Abdominal Aortogram w/Lower Extremity;  Surgeon: [name], MD;  Location: [facility];  Service: Cardiovascular;  Laterality: N/A;  . TUBAL LIGATION  1991     OB History   None      Home Medications    Prior to Admission medications   Medication Sig Start Date End Date Taking? Authorizing Provider  albuterol (PROVENTIL HFA;VENTOLIN HFA) 108 (90 Base) MCG/ACT inhaler Inhale 2 puffs every 6 (six) hours as needed into the lungs for wheezing. 01/09/17  Yes [name], MD  aspirin EC 81 MG tablet Take 1 tablet (81 mg total) by mouth daily. 10/03/16  Yes Funches, Josalyn, MD  atorvastatin (LIPITOR) 20 MG tablet Take 1 tablet (20 mg total) by mouth daily. 10/03/16  Yes Funches, Josalyn, MD  famotidine (PEPCID) 20 MG tablet Take 1 tablet (20 mg total) by mouth 2 (two) times daily. 03/13/17  Yes Murray, Alyssa B, PA-C  Fluticasone-Salmeterol (ADVAIR) 100-50 MCG/DOSE AEPB Inhale 1 puff into the lungs 2 (two) times daily. 10/03/16  Yes Funches, Josalyn,  MD  gabapentin (NEURONTIN) 300 MG capsule Take by mouth 300 mg in the day as needed for pain and 300 mg at night Patient taking differently: Take 300 mg by mouth at bedtime.  10/03/16  Yes Funches, Josalyn, MD  lisinopril-hydrochlorothiazide (ZESTORETIC) 20-12.5 MG tablet Take 1 tablet by mouth daily. 10/03/16  Yes Funches, Josalyn, MD  loperamide (IMODIUM A-D) 2 MG tablet Take 1 tablet (2 mg total) by mouth every 8 (eight) hours as needed for diarrhea or loose stools. 03/13/17  Yes [name], MD    omeprazole (PRILOSEC) 40 MG capsule Take 1 capsule (40 mg total) by mouth daily. 10/03/16  Yes Funches, Josalyn, MD  Potassium Chloride ER 20 MEQ TBCR Take 40 mEq by mouth daily. Patient taking differently: Take 20 mEq by mouth 2 (two) times daily.  07/08/17  Yes [name], MD  ammonium lactate (LAC-HYDRIN) 12 % cream Apply topically as needed for dry skin. Patient not taking: Reported on 09/16/2017 08/21/17   [name], MD  cyclobenzaprine (FLEXERIL) 5 MG tablet Take 1 tablet (5 mg total) by mouth 2 (two) times daily as needed for muscle spasms. 09/29/17   Horton, Barbette Hair, MD  meloxicam (MOBIC) 15 MG tablet Take 1 tablet (15 mg total) by mouth daily. Patient not taking: Reported on 09/29/2017 08/20/17   [name], MD  nystatin (MYCOSTATIN) 100000 UNIT/ML suspension Take 5 mLs (500,000 Units total) by mouth 4 (four) times daily. Swish ans swallow Patient not taking: Reported on 09/29/2017 08/21/17   [name], MD    Family History Family History  Problem Relation Age of Onset  . Hyperlipidemia Mother   . Hypertension Mother   . Heart disease Mother        Atrial Fib.  . Hyperlipidemia Sister   . Hypertension Sister   . Heart disease Sister        Before age 38,  CHF  . Heart attack Brother   . Hyperlipidemia Brother   . Hypertension Brother   . Heart disease Brother   . Hypertension Daughter     Social History Social History   Tobacco Use  . Smoking status: Former Smoker    Packs/day: 0.25    Years: 40.00    Pack years: 10.00    Types: Cigarettes    Last attempt to quit: 02/08/2017    Years since quitting: 0.6  . Smokeless tobacco: Never Used  . Tobacco comment: 3 cigarettes per day  Substance Use Topics  . Alcohol use: No  . Drug use: No     Allergies   Vicodin [hydrocodone-acetaminophen] and Wellbutrin [bupropion]   Review of Systems Review of Systems  Constitutional: Negative for fever.  Cardiovascular: Negative for leg swelling.   Musculoskeletal:       Right leg cramping  Skin: Negative for color change.  Neurological: Negative for weakness and numbness.  All other systems reviewed and are negative.    Physical Exam Updated Vital Signs BP 124/82 (BP Location: Right Arm)   Pulse 65   Temp 97.7 F (36.5 C) (Oral)   Resp 17   Ht 5\' 4"  (1.626 m)   Wt 74.4 kg (164 lb)   SpO2 99%   BMI 28.15 kg/m   Physical Exam  Constitutional: She is oriented to person, place, and time. She appears well-developed and well-nourished. No distress.  HENT:  Head: Normocephalic and atraumatic.  Cardiovascular: Normal rate, regular rhythm and normal heart sounds.  Pulmonary/Chest: Effort normal and breath  sounds normal. No respiratory distress. She has no wheezes.  Musculoskeletal: She exhibits no edema.  Spasm palpable right thigh, no overlying skin changes, no significant swelling, no tenderness to palpation, 1+ DP pulse bilaterally, bilateral feet warm  Neurological: She is alert and oriented to person, place, and time.  5 out of 5 strength bilateral lower extremities  Skin: Skin is warm and dry.  Psychiatric: She has a normal mood and affect.  Nursing note and vitals reviewed.    ED Treatments / Results  Labs (all labs ordered are listed, but only abnormal results are displayed) Labs Reviewed  I-STAT CHEM 8, ED - Abnormal; Notable for the following components:      Result Value   Potassium 3.3 (*)    BUN 22 (*)    All other components within normal limits    EKG None  Radiology No results found.  Procedures Procedures (including critical care time)  Medications Ordered in ED Medications  potassium chloride SA (K-DUR,KLOR-CON) CR tablet 40 mEq (has no administration in time range)  diazepam (VALIUM) tablet 5 mg (5 mg Oral Given 09/29/17 0504)     Initial Impression / Assessment and Plan / ED Course  I have reviewed the triage vital signs and the nursing notes.  Pertinent labs & imaging results that  were available during my care of the patient were reviewed by me and considered in my medical decision making (see chart for details).     Patient presents with "spasm" of her right lower leg.  She is overall nontoxic-appearing vital signs are reassuring.  She has palpable muscle spasm.  She reports worsening with flexion and extension of the foot.  She has good pulses and warm feet.  Doubt vascular etiology.  Patient was given Valium for muscle relaxation.  Chem-8 shows mildly low potassium.  This was supplemented.  On recheck, she states she feels much better.  Will discharge home with short course of Flexeril.  Also recommend increasing potassium in diet.  Doubt DVT as patient is low risk and has no signs or symptoms.  After history, exam, and medical workup I feel the patient has been appropriately medically screened and is safe for discharge home. Pertinent diagnoses were discussed with the patient. Patient was given return precautions.     Final Clinical Impressions(s) / ED Diagnoses   Final diagnoses:  Leg cramping  Hypokalemia    ED Discharge Orders        Ordered    cyclobenzaprine (FLEXERIL) 5 MG tablet  2 times daily PRN     09/29/17 0614       Merryl Hacker, MD 09/29/17 [phone]

## 2017-09-30 ENCOUNTER — Encounter (HOSPITAL_COMMUNITY): Payer: Medicare HMO

## 2017-09-30 ENCOUNTER — Ambulatory Visit: Payer: Medicare HMO | Admitting: Vascular Surgery

## 2017-10-06 ENCOUNTER — Encounter: Payer: Self-pay | Admitting: Internal Medicine

## 2017-10-06 ENCOUNTER — Ambulatory Visit: Payer: Medicare HMO | Attending: Internal Medicine | Admitting: Internal Medicine

## 2017-10-06 VITALS — BP 149/105 | HR 60 | Temp 98.0°F | Resp 16 | Wt 170.4 lb

## 2017-10-06 DIAGNOSIS — Z87891 Personal history of nicotine dependence: Secondary | ICD-10-CM | POA: Insufficient documentation

## 2017-10-06 DIAGNOSIS — Z79899 Other long term (current) drug therapy: Secondary | ICD-10-CM | POA: Insufficient documentation

## 2017-10-06 DIAGNOSIS — J449 Chronic obstructive pulmonary disease, unspecified: Secondary | ICD-10-CM | POA: Diagnosis not present

## 2017-10-06 DIAGNOSIS — Z8249 Family history of ischemic heart disease and other diseases of the circulatory system: Secondary | ICD-10-CM | POA: Insufficient documentation

## 2017-10-06 DIAGNOSIS — Z885 Allergy status to narcotic agent status: Secondary | ICD-10-CM | POA: Insufficient documentation

## 2017-10-06 DIAGNOSIS — Z7952 Long term (current) use of systemic steroids: Secondary | ICD-10-CM | POA: Diagnosis not present

## 2017-10-06 DIAGNOSIS — E785 Hyperlipidemia, unspecified: Secondary | ICD-10-CM | POA: Diagnosis not present

## 2017-10-06 DIAGNOSIS — Q809 Congenital ichthyosis, unspecified: Secondary | ICD-10-CM | POA: Insufficient documentation

## 2017-10-06 DIAGNOSIS — M19011 Primary osteoarthritis, right shoulder: Secondary | ICD-10-CM | POA: Insufficient documentation

## 2017-10-06 DIAGNOSIS — Z791 Long term (current) use of non-steroidal anti-inflammatories (NSAID): Secondary | ICD-10-CM | POA: Insufficient documentation

## 2017-10-06 DIAGNOSIS — I70219 Atherosclerosis of native arteries of extremities with intermittent claudication, unspecified extremity: Secondary | ICD-10-CM | POA: Diagnosis not present

## 2017-10-06 DIAGNOSIS — Z9889 Other specified postprocedural states: Secondary | ICD-10-CM | POA: Insufficient documentation

## 2017-10-06 DIAGNOSIS — Z888 Allergy status to other drugs, medicaments and biological substances status: Secondary | ICD-10-CM | POA: Insufficient documentation

## 2017-10-06 DIAGNOSIS — Z853 Personal history of malignant neoplasm of breast: Secondary | ICD-10-CM | POA: Diagnosis not present

## 2017-10-06 DIAGNOSIS — E876 Hypokalemia: Secondary | ICD-10-CM | POA: Diagnosis not present

## 2017-10-06 DIAGNOSIS — K219 Gastro-esophageal reflux disease without esophagitis: Secondary | ICD-10-CM | POA: Insufficient documentation

## 2017-10-06 DIAGNOSIS — Z8349 Family history of other endocrine, nutritional and metabolic diseases: Secondary | ICD-10-CM | POA: Insufficient documentation

## 2017-10-06 DIAGNOSIS — I10 Essential (primary) hypertension: Secondary | ICD-10-CM | POA: Diagnosis not present

## 2017-10-06 DIAGNOSIS — Z7982 Long term (current) use of aspirin: Secondary | ICD-10-CM | POA: Diagnosis not present

## 2017-10-06 MED ORDER — SPIRONOLACTONE 25 MG PO TABS: 12.5000 mg | ORAL_TABLET | Freq: Every day | ORAL | 3 refills | Status: DC

## 2017-10-06 MED ORDER — OMEPRAZOLE 40 MG PO CPDR: 40.0000 mg | DELAYED_RELEASE_CAPSULE | Freq: Every day | ORAL | 5 refills | Status: DC

## 2017-10-06 MED ORDER — LISINOPRIL 20 MG PO TABS: 20.0000 mg | ORAL_TABLET | Freq: Every day | ORAL | 3 refills | Status: DC

## 2017-10-06 MED ORDER — ATORVASTATIN CALCIUM 20 MG PO TABS: 20.0000 mg | ORAL_TABLET | Freq: Every day | ORAL | 3 refills | Status: DC

## 2017-10-06 MED ORDER — AMMONIUM LACTATE 12 % EX CREA: TOPICAL_CREAM | CUTANEOUS | 0 refills | Status: DC | PRN

## 2017-10-06 MED FILL — SPIRONOLACTONE 25 MG TABLET: 25 | 30 days supply | Qty: 15 | Fill #0

## 2017-10-06 MED FILL — LISINOPRIL 20 MG TAB: 20 | 30 days supply | Qty: 30 | Fill #0

## 2017-10-06 NOTE — Patient Instructions (Signed)
Give appointment with Lurena Joiner in 2 weeks for blood pressure recheck.  Stop Lisinopril/Hydrochlorothiazide. Stop Potassium supplement.    Start Lisinopril 20 mg daily and Spironolactone 25 mg 1/2 tablet daily. Check blood pressure twice a week and record readings.  Bring in readings on visit with nurse/clinical pharmacist in 2 weeks.   Stop Ibuprofen. Start Meloxicam instead.  Take Omeprazole 40 mg daily.

## 2017-10-06 NOTE — Progress Notes (Signed)
Patient ID: Brandy Stevenson, female    DOB: 12/09/1955  MRN: [id number]  CC: Hypertension   Subjective: Brandy Stevenson is a 62 y.o. female who presents for chronic ds management.  Spouse is with her Her concerns today include:  Hx of PAD, HTN, HL, tob dep, LT breast CA (lumpectomy and XRT 2009), GERD, COPD, Trigeminal neuralgia RT, dep/anxiety, OA shoulders  HTN: BP elev today.  Reports compliance with Lis/HCTZ and K+ supplement No HA, dizziness, CP.  Seen in the ER recently for right leg cramps and found to have potassium level of 3.3.  She reconfirms that she takes the potassium supplement daily.  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  Requests prescription to be sent to [facility] mail order. -she tries to avoid foods that cause flares -On last visit we discontinued ibuprofen and placed on Mobic.  Still taking Ibuprofen; has not started Mobic as yet.  Did no get the Lac-Hydrin lotion prescribed on last visit.  It was sent to an outside pharmacy and she could not afford.  She requests prescription to be sent to [facility] mail order.    Patient Active Problem List   Diagnosis Date Noted  . Former smoker 04/07/2017  . Primary osteoarthritis of right shoulder 04/07/2017  . Chronic midline low back pain without sciatica 04/07/2017  . Hiatal hernia 05/01/2016  . DJD (degenerative joint disease) of cervical spine 04/17/2016  . History of breast cancer 02/18/2016  . Foot callus 02/18/2016  . Anxiety and depression 05/22/2015  . Insomnia 03/15/2015  . Unspecified vitamin D deficiency 04/08/2013  . Other and unspecified hyperlipidemia 04/08/2013  . Atherosclerotic PVD with intermittent claudication ([place]) 03/22/2013  . Essential hypertension, benign 02/23/2013  . GERD (gastroesophageal reflux disease) 02/23/2013  . COPD (chronic obstructive pulmonary disease) ([place]) 02/23/2013     Current Outpatient Medications on File Prior  to Visit  Medication Sig Dispense Refill  . albuterol (PROVENTIL HFA;VENTOLIN HFA) 108 (90 Base) MCG/ACT inhaler Inhale 2 puffs every 6 (six) hours as needed into the lungs for wheezing. 3 Inhaler 4  . aspirin EC 81 MG tablet Take 1 tablet (81 mg total) by mouth daily. 90 tablet 3  . cyclobenzaprine (FLEXERIL) 5 MG tablet Take 1 tablet (5 mg total) by mouth 2 (two) times daily as needed for muscle spasms. 10 tablet 0  . Fluticasone-Salmeterol (ADVAIR) 100-50 MCG/DOSE AEPB Inhale 1 puff into the lungs 2 (two) times daily. 180 each 3  . gabapentin (NEURONTIN) 300 MG capsule Take by mouth 300 mg in the day as needed for pain and 300 mg at night (Patient taking differently: Take 300 mg by mouth at bedtime. ) 180 capsule 3  . loperamide (IMODIUM A-D) 2 MG tablet Take 1 tablet (2 mg total) by mouth every 8 (eight) hours as needed for diarrhea or loose stools. 12 tablet 0  . meloxicam (MOBIC) 15 MG tablet Take 1 tablet (15 mg total) by mouth daily. (Patient not taking: Reported on 09/29/2017) 30 tablet 6   No current facility-administered medications on file prior to visit.     Allergies  Allergen Reactions  . Vicodin [Hydrocodone-Acetaminophen] Nausea And Vomiting and Other (See Comments)    Sweating  . Wellbutrin [Bupropion]     Made me feel bad    Social History   Socioeconomic History  . Marital status: Married    Spouse name: Not on file  . Number of children: Not  on file  . Years of education: Not on file  . Highest education level: Not on file  Occupational History  . Not on file  Social Needs  . Financial resource strain: Not on file  . Food insecurity:    Worry: Not on file    Inability: Not on file  . Transportation needs:    Medical: Not on file    Non-medical: Not on file  Tobacco Use  . Smoking status: Former Smoker    Packs/day: 0.25    Years: 40.00    Pack years: 10.00    Types: Cigarettes    Last attempt to quit: 02/08/2017    Years since quitting: 0.6  .  Smokeless tobacco: Never Used  . Tobacco comment: 3 cigarettes per day  Substance and Sexual Activity  . Alcohol use: No  . Drug use: No  . Sexual activity: Not Currently  Lifestyle  . Physical activity:    Days per week: Not on file    Minutes per session: Not on file  . Stress: Not on file  Relationships  . Social connections:    Talks on phone: Not on file    Gets together: Not on file    Attends religious service: Not on file    Active member of club or organization: Not on file    Attends meetings of clubs or organizations: Not on file    Relationship status: Not on file  . Intimate partner violence:    Fear of current or ex partner: Not on file    Emotionally abused: Not on file    Physically abused: Not on file    Forced sexual activity: Not on file  Other Topics Concern  . Not on file  Social History Narrative  . Not on file    Family History  Problem Relation Age of Onset  . Hyperlipidemia Mother   . Hypertension Mother   . Heart disease Mother        Atrial Fib.  . Hyperlipidemia Sister   . Hypertension Sister   . Heart disease Sister        Before age 33,  CHF  . Heart attack Brother   . Hyperlipidemia Brother   . Hypertension Brother   . Heart disease Brother   . Hypertension Daughter     Past Surgical History:  Procedure Laterality Date  . APPENDECTOMY  1980  . BREAST LUMPECTOMY Left    s/p radiation therapy   . BREAST SURGERY    . COLONOSCOPY  07/2015  . COLONOSCOPY WITH PROPOFOL N/A 06/26/2015   Procedure: COLONOSCOPY WITH PROPOFOL;  Surgeon: [name], MD;  Location: [facility];  Service: Endoscopy;  Laterality: N/A;  . PERIPHERAL VASCULAR CATHETERIZATION N/A 10/22/2015   Procedure: Abdominal Aortogram w/Lower Extremity;  Surgeon: [name], MD;  Location: [facility];  Service: Cardiovascular;  Laterality: N/A;  . TUBAL LIGATION  1991    ROS: Review of Systems Neg except as above  PHYSICAL EXAM: BP (!)  149/105   Pulse 60   Temp 98 F (36.7 C) (Oral)   Resp 16   Wt 170 lb 6.4 oz (77.3 kg)   SpO2 98%   BMI 29.25 kg/m   BP 150/92 Physical Exam General appearance - alert, well appearing, and in no distress Mental status - normal mood, behavior, speech, dress, motor activity, and thought processes Mouth - mucous membranes moist, pharynx normal without lesions Chest - clear to auscultation, no wheezes, rales  or rhonchi, symmetric air entry Heart - normal rate, regular rhythm, normal S1, S2, no murmurs, rubs, clicks or gallops Extremities - peripheral pulses normal, no pedal edema, no clubbing or cyanosis    Chemistry      Component Value Date/Time   NA 139 09/29/2017 0526   NA 144 04/07/2017 1143   K 3.3 (L) 09/29/2017 0526   CL 102 09/29/2017 0526   CO2 21 (L) 08/25/2017 0118   BUN 22 (H) 09/29/2017 0526   BUN 15 04/07/2017 1143   CREATININE 0.90 09/29/2017 0526   CREATININE 0.77 04/17/2016 1104      Component Value Date/Time   CALCIUM 9.7 08/25/2017 0118   ALKPHOS 117 10/03/2016 0954   AST 35 10/03/2016 0954   ALT 28 10/03/2016 0954   BILITOT <0.2 10/03/2016 0954        ASSESSMENT AND PLAN: 1. Essential hypertension Given the low potassium, I recommend discontinue lisinopril HCTZ.  Change to lisinopril 20 mg daily and Spironolactone 12.5 mg daily.  Return to the lab in 1 week for recheck potassium level. - lisinopril (PRINIVIL,ZESTRIL) 20 MG tablet; Take 1 tablet (20 mg total) by mouth daily.  Dispense: 90 tablet; Refill: 3 - spironolactone (ALDACTONE) 25 MG tablet; Take 0.5 tablets (12.5 mg total) by mouth daily.  Dispense: 90 tablet; Refill: 3  2. Gastroesophageal reflux disease without esophagitis GERD precautions discussed.  Prescription for omeprazole will be sent to [facility]. Discontinue ibuprofen.  Use meloxicam instead - omeprazole (PRILOSEC) 40 MG capsule; Take 1 capsule (40 mg total) by mouth daily.  Dispense: 30 capsule; Refill: 5  3. Hyperlipidemia,  unspecified hyperlipidemia type - atorvastatin (LIPITOR) 20 MG tablet; Take 1 tablet (20 mg total) by mouth daily.  Dispense: 90 tablet; Refill: 3  4. Ichthyosis - ammonium lactate (LAC-HYDRIN) 12 % cream; Apply topically as needed for dry skin.  Dispense: 385 g; Refill: 0  5. Hypokalemia - Potassium; Future   Patient was given the opportunity to ask questions.  Patient verbalized understanding of the plan and was able to repeat key elements of the plan.   Orders Placed This Encounter  Procedures  . Potassium     Requested Prescriptions   Signed Prescriptions Disp Refills  . omeprazole (PRILOSEC) 40 MG capsule 30 capsule 5    Sig: Take 1 capsule (40 mg total) by mouth daily.  [name] atorvastatin (LIPITOR) 20 MG tablet 90 tablet 3    Sig: Take 1 tablet (20 mg total) by mouth daily.  [name] lisinopril (PRINIVIL,ZESTRIL) 20 MG tablet 90 tablet 3    Sig: Take 1 tablet (20 mg total) by mouth daily.  [name] spironolactone (ALDACTONE) 25 MG tablet 90 tablet 3    Sig: Take 0.5 tablets (12.5 mg total) by mouth daily.  [name] ammonium lactate (LAC-HYDRIN) 12 % cream 385 g 0    Sig: Apply topically as needed for dry skin.    Return in about 3 months (around 01/06/2018).  Karle Plumber, MD, FACP

## 2017-10-16 ENCOUNTER — Other Ambulatory Visit: Payer: Self-pay

## 2017-10-16 ENCOUNTER — Encounter: Payer: Self-pay | Admitting: Radiology

## 2017-10-16 ENCOUNTER — Ambulatory Visit
Admission: RE | Admit: 2017-10-16 | Discharge: 2017-10-16 | Disposition: A | Discharge status: Home / Self Care | Payer: Medicare HMO | Source: Ambulatory Visit | Attending: Vascular Surgery | Admitting: Vascular Surgery

## 2017-10-16 DIAGNOSIS — I7409 Other arterial embolism and thrombosis of abdominal aorta: Secondary | ICD-10-CM

## 2017-10-16 DIAGNOSIS — Z01818 Encounter for other preprocedural examination: Secondary | ICD-10-CM | POA: Diagnosis not present

## 2017-10-16 DIAGNOSIS — I70213 Atherosclerosis of native arteries of extremities with intermittent claudication, bilateral legs: Secondary | ICD-10-CM

## 2017-10-16 DIAGNOSIS — I739 Peripheral vascular disease, unspecified: Secondary | ICD-10-CM

## 2017-10-16 DIAGNOSIS — I70219 Atherosclerosis of native arteries of extremities with intermittent claudication, unspecified extremity: Secondary | ICD-10-CM

## 2017-10-16 MED ORDER — IOPAMIDOL (ISOVUE-370) INJECTION 76%
125.0000 mL | Freq: Once | INTRAVENOUS | Status: AC | PRN
  Administered 2017-10-16: 125 mL via INTRAVENOUS

## 2017-10-19 ENCOUNTER — Other Ambulatory Visit: Payer: Self-pay

## 2017-10-19 ENCOUNTER — Ambulatory Visit (HOSPITAL_COMMUNITY)
Admission: RE | Admit: 2017-10-19 | Discharge: 2017-10-19 | Disposition: A | Discharge status: Home / Self Care | Payer: Medicare HMO | Source: Ambulatory Visit | Attending: Vascular Surgery | Admitting: Vascular Surgery

## 2017-10-19 ENCOUNTER — Encounter: Payer: Self-pay | Admitting: Vascular Surgery

## 2017-10-19 ENCOUNTER — Ambulatory Visit (INDEPENDENT_AMBULATORY_CARE_PROVIDER_SITE_OTHER): Payer: Medicare HMO | Admitting: Vascular Surgery

## 2017-10-19 VITALS — BP 177/101 | HR 61 | Resp 18 | Ht 64.0 in | Wt 175.0 lb

## 2017-10-19 DIAGNOSIS — I70219 Atherosclerosis of native arteries of extremities with intermittent claudication, unspecified extremity: Secondary | ICD-10-CM | POA: Insufficient documentation

## 2017-10-19 DIAGNOSIS — I7409 Other arterial embolism and thrombosis of abdominal aorta: Secondary | ICD-10-CM

## 2017-10-19 NOTE — Progress Notes (Signed)
Patient name: Brandy Stevenson MRN: [id number] DOB: November 21, 1955 Sex: female  REASON FOR VISIT:   Follow-up of aortic occlusion.  HPI:   [name] is a pleasant 62 y.o. female who had previously been followed by Dr. Kellie Simmering with peripheral vascular disease.  Patient was seen by our nurse practitioner and set up for an arteriogram which I performed on 10/22/2015.  She had an occluded infrarenal aorta below the level of the inferior mesenteric artery.  The common iliac arteries were occluded.  She then went to the emergency room with rest pain and then was set up to see me in the office.  I saw her on 09/16/2016 and on my history her symptoms had not changed significantly in the last 2 years.  I did not get any history of rest pain or nonhealing ulcers.  In addition she had quit smoking in December 2018.  Her legs have been giving out on her.  I recommended a CT angiogram with runoff and follow-up ABIs.  I felt that if her symptoms were stable we could follow this conservatively.  If her symptoms progress we could consider aortofemoral bypass grafting versus axillobifemoral bypass grafting.  She has undergone preoperative cardiac clearance and is cleared if we do elect to proceed with surgery.  Since I saw the patient last, she continues to have bilateral lower extremity claudication.  This occurs at half a block.  She experiences pain in her calves, thighs, and hips which is brought on by ambulation and relieved with rest.  Her symptoms are little more significant on the right side.  Her symptoms have been stable over the last 6 months.  She denies any history of rest pain or nonhealing ulcers.  Her blood pressure has been high lately.  Her medications were being adjusted because of some swelling in her legs.  She quit smoking in December 2018.  She is on gabapentin for neuropathy.  Past Medical History:  Diagnosis Date  . Arthritis    back problems to be evaluated by neurology next month  .  Cancer ([place]) 2009   L, Breast, radiation and surgery.  [name] COPD (chronic obstructive pulmonary disease) ([place])   . Depression   . GERD (gastroesophageal reflux disease)   . Hyperlipemia   . Hypertension   . Peripheral vascular disease ([place]) 2013    Family History  Problem Relation Age of Onset  . Hyperlipidemia Mother   . Hypertension Mother   . Heart disease Mother        Atrial Fib.  . Hyperlipidemia Sister   . Hypertension Sister   . Heart disease Sister        Before age 34,  CHF  . Heart attack Brother   . Hyperlipidemia Brother   . Hypertension Brother   . Heart disease Brother   . Hypertension Daughter     SOCIAL HISTORY: She quit smoking in December 2018. Social History   Tobacco Use  . Smoking status: Former Smoker    Packs/day: 0.25    Years: 40.00    Pack years: 10.00    Types: Cigarettes    Last attempt to quit: 02/08/2017    Years since quitting: 0.6  . Smokeless tobacco: Never Used  . Tobacco comment: 3 cigarettes per day  Substance Use Topics  . Alcohol use: No    Allergies  Allergen Reactions  . Vicodin [Hydrocodone-Acetaminophen] Nausea And Vomiting and Other (See Comments)    Sweating  . Wellbutrin [Bupropion]  Made me feel bad    Current Outpatient Medications  Medication Sig Dispense Refill  . albuterol (PROVENTIL HFA;VENTOLIN HFA) 108 (90 Base) MCG/ACT inhaler Inhale 2 puffs every 6 (six) hours as needed into the lungs for wheezing. 3 Inhaler 4  . ammonium lactate (LAC-HYDRIN) 12 % cream Apply topically as needed for dry skin. 385 g 0  . aspirin EC 81 MG tablet Take 1 tablet (81 mg total) by mouth daily. 90 tablet 3  . atorvastatin (LIPITOR) 20 MG tablet Take 1 tablet (20 mg total) by mouth daily. 90 tablet 3  . cyclobenzaprine (FLEXERIL) 5 MG tablet Take 1 tablet (5 mg total) by mouth 2 (two) times daily as needed for muscle spasms. 10 tablet 0  . Fluticasone-Salmeterol (ADVAIR) 100-50 MCG/DOSE AEPB Inhale 1 puff into the lungs 2 (two)  times daily. 180 each 3  . gabapentin (NEURONTIN) 300 MG capsule Take by mouth 300 mg in the day as needed for pain and 300 mg at night (Patient taking differently: Take 300 mg by mouth at bedtime. ) 180 capsule 3  . lisinopril (PRINIVIL,ZESTRIL) 20 MG tablet Take 1 tablet (20 mg total) by mouth daily. 90 tablet 3  . loperamide (IMODIUM A-D) 2 MG tablet Take 1 tablet (2 mg total) by mouth every 8 (eight) hours as needed for diarrhea or loose stools. 12 tablet 0  . meloxicam (MOBIC) 15 MG tablet Take 1 tablet (15 mg total) by mouth daily. 30 tablet 6  . omeprazole (PRILOSEC) 40 MG capsule Take 1 capsule (40 mg total) by mouth daily. 30 capsule 5  . spironolactone (ALDACTONE) 25 MG tablet Take 0.5 tablets (12.5 mg total) by mouth daily. 90 tablet 3   No current facility-administered medications for this visit.     REVIEW OF SYSTEMS:  [X]  denotes positive finding, [ ]  denotes negative finding Cardiac  Comments:  Chest pain or chest pressure:    Shortness of breath upon exertion:    Short of breath when lying flat:    Irregular heart rhythm:        Vascular    Pain in calf, thigh, or hip brought on by ambulation: x   Pain in feet at night that wakes you up from your sleep:  x   Blood clot in your veins:    Leg swelling:  x       Pulmonary    Oxygen at home:    Productive cough:     Wheezing:  x       Neurologic    Sudden weakness in arms or legs:     Sudden numbness in arms or legs:     Sudden onset of difficulty speaking or slurred speech:    Temporary loss of vision in one eye:     Problems with dizziness:         Gastrointestinal    Blood in stool:     Vomited blood:         Genitourinary    Burning when urinating:     Blood in urine:        Psychiatric    Major depression:         Hematologic    Bleeding problems:    Problems with blood clotting too easily:        Skin    Rashes or ulcers:        Constitutional    Fever or chills:     PHYSICAL EXAM:    Vitals:  10/19/17 1056 10/19/17 1057  BP: (!) 172/100 (!) 177/101  Pulse: 61   Resp: 18   SpO2: 98%   Weight: 175 lb (79.4 kg)   Height: 5\' 4"  (1.626 m)     GENERAL: The patient is a well-nourished female, in no acute distress. The vital signs are documented above. CARDIAC: There is a regular rate and rhythm.  VASCULAR: I cannot detect carotid bruits. I cannot palpate femoral, popliteal, or pedal pulses. PULMONARY: There is good air exchange bilaterally without wheezing or rales. ABDOMEN: Soft and non-tender with normal pitched bowel sounds.  MUSCULOSKELETAL: There are no major deformities or cyanosis. NEUROLOGIC: No focal weakness or paresthesias are detected. SKIN: There are no ulcers or rashes noted. PSYCHIATRIC: The patient has a normal affect.  DATA:    ARTERIAL DOPPLER STUDY: I have independently interpreted her arterial Doppler study today.  On the right side she has monophasic Doppler signals with an ABI of 61%.  This is not changed significantly from 59% previously.  On the left side she has a monophasic dorsalis pedis and posterior tibial signal with an ABI of 59%.  This is not changed significantly from her previous ABI 54%.  CT ANGIOGRAM: I have reviewed her CT angiogram.  This shows a distal aortic occlusion.  The infrarenal aorta and iliac arteries are markedly calcified.  She has single-vessel runoff on the right via the anterior tibial artery and three-vessel runoff on the left.  MEDICAL ISSUES:   AORTOILIAC OCCLUSIVE DISEASE: Her claudication symptoms have been stable.  For this reason I have recommended continued conservative treatment.  She quit smoking in December 2018.  I encouraged her to walk is much as possible.  We have also discussed the importance of nutrition.  If her symptoms progressed and certainly we could consider her for aortofemoral bypass grafting.  However her aorta is significantly calcified which does increase the risk somewhat of surgery.   However based on her CT scan I think we could perform the proximal anastomosis just below the renals where the artery appears approachable.  Given that her symptoms are stable off I will see her back in 6 months with follow-up ABIs.  If her symptoms progress then we could consider aortofemoral bypass grafting.  HYPERTENSION: The patient's initial blood pressure today was elevated. We repeated this and this was still elevated. We have encouraged the patient to follow up with their primary care physician for management of their blood pressure.  Of note, her CT angiogram did not show any evidence of significant renal artery stenosis.   Deitra Mayo [facility] [phone]

## 2017-10-22 ENCOUNTER — Telehealth: Payer: Self-pay | Admitting: Internal Medicine

## 2017-10-22 DIAGNOSIS — I10 Essential (primary) hypertension: Secondary | ICD-10-CM

## 2017-10-22 MED ORDER — SPIRONOLACTONE 25 MG PO TABS: 25.0000 mg | ORAL_TABLET | Freq: Every day | ORAL | 3 refills | Status: DC

## 2017-10-22 NOTE — Telephone Encounter (Addendum)
Follow up phone call placed to Mrs. Rogan. Informed patient to increase  Spironolactone to 25 mg ( whole tablet) daily. Reminded of future appointments: Blood pressure f/u and Lab work at that appointment: recheck potassium

## 2017-10-22 NOTE — Telephone Encounter (Signed)
Brandy Stevenson is concerned about the elevated blood pressure readings since change in medications. She reports that she is no longer taking Lisinopril/ HCTZ. She takes Lisinopril 20 mg and spironolactone 12.5 mg medication everyday around 0900.  Since the change in medication she reports ranges of  SBP: 170-179  And DBP: 100-114.  Denies headaches, chest pain, or  dizziness.  She noticed more swelling in BLE.  Does not add salt or cook with salt. She states she has been aware of her salt intake.  She was asked to take her blood pressure while on the phone. After minimum to no movement, she reports the reading of: 169/117.  Please advise.

## 2017-10-22 NOTE — Telephone Encounter (Signed)
Patient called and said the her BP was 107/104 then 15 mins later 179/114 patient would like to know what to do.

## 2017-10-26 ENCOUNTER — Encounter (HOSPITAL_COMMUNITY): Payer: Medicare HMO

## 2017-10-26 MED FILL — SPIRONOLACTONE 25 MG TABLET: 25 | 30 days supply | Qty: 30 | Fill #0

## 2017-10-27 ENCOUNTER — Ambulatory Visit: Payer: Medicare HMO

## 2017-10-27 ENCOUNTER — Encounter: Payer: Self-pay | Admitting: Internal Medicine

## 2017-10-27 ENCOUNTER — Ambulatory Visit: Payer: Medicare HMO | Attending: Family Medicine | Admitting: Internal Medicine

## 2017-10-27 ENCOUNTER — Telehealth: Payer: Self-pay | Admitting: Internal Medicine

## 2017-10-27 VITALS — BP 208/105 | HR 60 | Temp 98.2°F | Resp 16 | Wt 173.4 lb

## 2017-10-27 DIAGNOSIS — Z7982 Long term (current) use of aspirin: Secondary | ICD-10-CM | POA: Insufficient documentation

## 2017-10-27 DIAGNOSIS — Z923 Personal history of irradiation: Secondary | ICD-10-CM | POA: Diagnosis not present

## 2017-10-27 DIAGNOSIS — M19011 Primary osteoarthritis, right shoulder: Secondary | ICD-10-CM | POA: Insufficient documentation

## 2017-10-27 DIAGNOSIS — Z853 Personal history of malignant neoplasm of breast: Secondary | ICD-10-CM | POA: Insufficient documentation

## 2017-10-27 DIAGNOSIS — F329 Major depressive disorder, single episode, unspecified: Secondary | ICD-10-CM | POA: Insufficient documentation

## 2017-10-27 DIAGNOSIS — F419 Anxiety disorder, unspecified: Secondary | ICD-10-CM | POA: Insufficient documentation

## 2017-10-27 DIAGNOSIS — Z9851 Tubal ligation status: Secondary | ICD-10-CM | POA: Insufficient documentation

## 2017-10-27 DIAGNOSIS — Z8249 Family history of ischemic heart disease and other diseases of the circulatory system: Secondary | ICD-10-CM | POA: Insufficient documentation

## 2017-10-27 DIAGNOSIS — I10 Essential (primary) hypertension: Secondary | ICD-10-CM | POA: Diagnosis not present

## 2017-10-27 DIAGNOSIS — Z79899 Other long term (current) drug therapy: Secondary | ICD-10-CM | POA: Insufficient documentation

## 2017-10-27 DIAGNOSIS — E785 Hyperlipidemia, unspecified: Secondary | ICD-10-CM | POA: Diagnosis not present

## 2017-10-27 DIAGNOSIS — K219 Gastro-esophageal reflux disease without esophagitis: Secondary | ICD-10-CM | POA: Diagnosis not present

## 2017-10-27 DIAGNOSIS — J449 Chronic obstructive pulmonary disease, unspecified: Secondary | ICD-10-CM | POA: Diagnosis not present

## 2017-10-27 DIAGNOSIS — G4700 Insomnia, unspecified: Secondary | ICD-10-CM | POA: Diagnosis not present

## 2017-10-27 DIAGNOSIS — E876 Hypokalemia: Secondary | ICD-10-CM

## 2017-10-27 DIAGNOSIS — Z885 Allergy status to narcotic agent status: Secondary | ICD-10-CM | POA: Insufficient documentation

## 2017-10-27 LAB — POTASSIUM

## 2017-10-27 MED ORDER — AMLODIPINE BESYLATE 5 MG PO TABS: 5.0000 mg | ORAL_TABLET | Freq: Every day | ORAL | 3 refills | Status: DC

## 2017-10-27 MED ORDER — CLONIDINE HCL 0.1 MG PO TABS: 0.1000 mg | ORAL_TABLET | Freq: Once | ORAL | Status: DC

## 2017-10-27 MED ORDER — CLONIDINE HCL 0.1 MG PO TABS
0.1000 mg | ORAL_TABLET | Freq: Once | ORAL | Status: AC
  Administered 2017-10-27: 0.1 mg via ORAL

## 2017-10-27 MED FILL — CYCLOBENZAPRINE 5 MG TABLET: 5 | 5 days supply | Qty: 10 | Fill #0

## 2017-10-27 MED FILL — AMLODIPINE BESYLATE 5 MG TA: 5 | 90 days supply | Qty: 90 | Fill #0

## 2017-10-27 NOTE — Patient Instructions (Signed)
Stop ibuprofen and Aleve. Start amlodipine 5 mg daily.  Prescription has been sent to our pharmacy

## 2017-10-27 NOTE — Progress Notes (Signed)
Patient ID: Trishna Cwik, female    DOB: 1955-08-14  MRN: [id number]  CC: Hypertension   Subjective: Brandy Stevenson is a 62 y.o. female who presents for UC visit for elevated BP Her concerns today include:  Hx of PAD, HTN, HL, tob dep, LT breast CA (lumpectomy and XRT 2009), GERD, COPD, Trigeminal neuralgia RT, dep/anxiety, OA shoulders  On last visit with me lisinopril/HCTZ and potassium were discontinued.  Patient was having persistent hypokalemia.  Patient was placed on lisinopril and spironolactone instead.  Since being on these 2 medications, she reports her blood pressure has been running above goal.  She gives a range of SBP 147-177/90-108 She endorses blurred vision, not new.  Has to wear reading glasses.  No SOB, no CP.  Endorses headaches when blood pressure is high.  Usually temporal headaches with no associated nausea or vomiting.  She has been taking ibuprofen 2-3 tabs 3 times a day.  She has taken the meloxicam.  She takes the ibuprofen also because of the arthritis in her shoulders.  Patient had been offered tramadol in the past.  She was seen by clinical pharmacist today for blood pressure recheck.  Blood pressure found to be elevated and so I was asked to see her.  Patient Active Problem List   Diagnosis Date Noted  . Ichthyosis 10/06/2017  . Former smoker 04/07/2017  . Primary osteoarthritis of right shoulder 04/07/2017  . Chronic midline low back pain without sciatica 04/07/2017  . Hiatal hernia 05/01/2016  . DJD (degenerative joint disease) of cervical spine 04/17/2016  . History of breast cancer 02/18/2016  . Foot callus 02/18/2016  . Anxiety and depression 05/22/2015  . Insomnia 03/15/2015  . Unspecified vitamin D deficiency 04/08/2013  . Other and unspecified hyperlipidemia 04/08/2013  . Atherosclerotic PVD with intermittent claudication ([place]) 03/22/2013  . Essential hypertension, benign 02/23/2013  . GERD (gastroesophageal reflux disease) 02/23/2013  .  COPD (chronic obstructive pulmonary disease) ([place]) 02/23/2013     Current Outpatient Medications on File Prior to Visit  Medication Sig Dispense Refill  . albuterol (PROVENTIL HFA;VENTOLIN HFA) 108 (90 Base) MCG/ACT inhaler Inhale 2 puffs every 6 (six) hours as needed into the lungs for wheezing. 3 Inhaler 4  . ammonium lactate (LAC-HYDRIN) 12 % cream Apply topically as needed for dry skin. 385 g 0  . aspirin EC 81 MG tablet Take 1 tablet (81 mg total) by mouth daily. 90 tablet 3  . atorvastatin (LIPITOR) 20 MG tablet Take 1 tablet (20 mg total) by mouth daily. 90 tablet 3  . cyclobenzaprine (FLEXERIL) 5 MG tablet Take 1 tablet (5 mg total) by mouth 2 (two) times daily as needed for muscle spasms. 10 tablet 0  . Fluticasone-Salmeterol (ADVAIR) 100-50 MCG/DOSE AEPB Inhale 1 puff into the lungs 2 (two) times daily. 180 each 3  . gabapentin (NEURONTIN) 300 MG capsule Take by mouth 300 mg in the day as needed for pain and 300 mg at night (Patient taking differently: Take 300 mg by mouth at bedtime. ) 180 capsule 3  . lisinopril (PRINIVIL,ZESTRIL) 20 MG tablet Take 1 tablet (20 mg total) by mouth daily. 90 tablet 3  . loperamide (IMODIUM A-D) 2 MG tablet Take 1 tablet (2 mg total) by mouth every 8 (eight) hours as needed for diarrhea or loose stools. 12 tablet 0  . meloxicam (MOBIC) 15 MG tablet Take 1 tablet (15 mg total) by mouth daily. 30 tablet 6  . omeprazole (PRILOSEC) 40 MG capsule Take 1 capsule (  40 mg total) by mouth daily. 30 capsule 5  . spironolactone (ALDACTONE) 25 MG tablet Take 1 tablet (25 mg total) by mouth daily. 30 tablet 3   No current facility-administered medications on file prior to visit.     Allergies  Allergen Reactions  . Vicodin [Hydrocodone-Acetaminophen] Nausea And Vomiting and Other (See Comments)    Sweating  . Wellbutrin [Bupropion]     Made me feel bad    Social History   Socioeconomic History  . Marital status: Married    Spouse name: Not on file  .  Number of children: Not on file  . Years of education: Not on file  . Highest education level: Not on file  Occupational History  . Not on file  Social Needs  . Financial resource strain: Not on file  . Food insecurity:    Worry: Not on file    Inability: Not on file  . Transportation needs:    Medical: Not on file    Non-medical: Not on file  Tobacco Use  . Smoking status: Former Smoker    Packs/day: 0.25    Years: 40.00    Pack years: 10.00    Types: Cigarettes    Last attempt to quit: 02/08/2017    Years since quitting: 0.7  . Smokeless tobacco: Never Used  . Tobacco comment: 3 cigarettes per day  Substance and Sexual Activity  . Alcohol use: No  . Drug use: No  . Sexual activity: Not Currently  Lifestyle  . Physical activity:    Days per week: Not on file    Minutes per session: Not on file  . Stress: Not on file  Relationships  . Social connections:    Talks on phone: Not on file    Gets together: Not on file    Attends religious service: Not on file    Active member of club or organization: Not on file    Attends meetings of clubs or organizations: Not on file    Relationship status: Not on file  . Intimate partner violence:    Fear of current or ex partner: Not on file    Emotionally abused: Not on file    Physically abused: Not on file    Forced sexual activity: Not on file  Other Topics Concern  . Not on file  Social History Narrative  . Not on file    Family History  Problem Relation Age of Onset  . Hyperlipidemia Mother   . Hypertension Mother   . Heart disease Mother        Atrial Fib.  . Hyperlipidemia Sister   . Hypertension Sister   . Heart disease Sister        Before age 61,  CHF  . Heart attack Brother   . Hyperlipidemia Brother   . Hypertension Brother   . Heart disease Brother   . Hypertension Daughter     Past Surgical History:  Procedure Laterality Date  . APPENDECTOMY  1980  . BREAST LUMPECTOMY Left    s/p radiation therapy    . BREAST SURGERY    . COLONOSCOPY  07/2015  . COLONOSCOPY WITH PROPOFOL N/A 06/26/2015   Procedure: COLONOSCOPY WITH PROPOFOL;  Surgeon: [name], MD;  Location: [facility];  Service: Endoscopy;  Laterality: N/A;  . PERIPHERAL VASCULAR CATHETERIZATION N/A 10/22/2015   Procedure: Abdominal Aortogram w/Lower Extremity;  Surgeon: [name], MD;  Location: [place];  Service: Cardiovascular;  Laterality: N/A;  .  TUBAL LIGATION  1991    ROS: Review of Systems Negative except as above. PHYSICAL EXAM: BP (!) 208/105   Pulse 60   Temp 98.2 F (36.8 C) (Oral)   Resp 16   Wt 173 lb 6.4 oz (78.7 kg)   SpO2 100%   BMI 29.76 kg/m   BP 180/100 Physical Exam  General appearance - alert, well appearing, and in no distress Mental status -patient emotional and tearful at times  neck - supple, no significant adenopathy Chest - clear to auscultation, no wheezes, rales or rhonchi, symmetric air entry Heart - normal rate, regular rhythm, normal S1, S2, no murmurs, rubs, clicks or gallops Neurological - cranial nerves II through XII intact, motor and sensory grossly normal bilaterally Extremities -no lower extremity edema.  ASSESSMENT AND PLAN: 1. Essential hypertension Patient given clonidine 0.1 mg x 2 today.  Blood pressure check 20 minutes after second dose was 180/100. -Amlodipine 5 mg added.  She will continue lisinopril and Spironolactone. Advised to discontinue ibuprofen as it may be playing a role in the blood pressure running so high. She will follow-up again in 2 to 3 weeks for recheck - cloNIDine (CATAPRES) tablet 0.1 mg - Basic Metabolic Panel  Patient was given the opportunity to ask questions.  Patient verbalized understanding of the plan and was able to repeat key elements of the plan.   Orders Placed This Encounter  Procedures  . Basic Metabolic Panel     Requested Prescriptions   Signed Prescriptions Disp Refills  . amLODipine (NORVASC)  5 MG tablet 90 tablet 3    Sig: Take 1 tablet (5 mg total) by mouth daily.    No follow-ups on file.  Karle Plumber, MD, FACP

## 2017-10-27 NOTE — Telephone Encounter (Signed)
1) Medication(s) Requested (by name): ° °2) Pharmacy of Choice: ° °3) Special Requests: ° ° °Approved medications will be sent to the pharmacy, we will reach out if there is an issue. ° °Requests made after 3pm may not be addressed until the following business day! ° °If a patient is unsure of the name of the medication(s) please note and ask patient to call back when they are able to provide all info, do not send to responsible party until all information is available! ° °

## 2017-10-27 NOTE — Progress Notes (Signed)
   S:    PCP: Dr. Wynetta Emery   Presents to the clinic for hypertension evaluation, counseling, and management. Patient was referred and last seen by Dr. Wynetta Emery 10/06/17. Pt was switched from lisinopril-hctz to lisinopril and spironolactone d/t hypokalemia. Of note, spironolactone recently increased from 12.5 to 25 mg daily d/t high pressure at home.  She has taken lisinopril but NOT spironolactone today.   Today, she denies CP, SOB. Does endorse HA that has been constant over several weeks. Denies having one currently. Has taken NSAID before presenting to clinic today. Of note, she reports taking ibuprofen TID daily for constant HA. Endorses constant left arm aching pain dover past 3 months.   Patient reports adherence with medications.  Current BP Medications include:   - lisinopril 20 mg daily - spironolactone 25 mg daily  Home BP readings:  SBPs: 119 - 170 DBPs: 72 - 114  O:  L arm after 5 minutes of rest: 208/105; HR 60 Last 3 Office BP readings: BP Readings from Last 3 Encounters:  10/19/17 (!) 177/101  10/06/17 (!) 149/105  09/29/17 116/88    BMET    Component Value Date/Time   NA 139 09/29/2017 0526   NA 144 04/07/2017 1143   K 3.3 (L) 09/29/2017 0526   CL 102 09/29/2017 0526   CO2 21 (L) 08/25/2017 0118   GLUCOSE 94 09/29/2017 0526   BUN 22 (H) 09/29/2017 0526   BUN 15 04/07/2017 1143   CREATININE 0.90 09/29/2017 0526   CREATININE 0.77 04/17/2016 1104   CALCIUM 9.7 08/25/2017 0118   GFRNONAA 53 (L) 08/25/2017 0118   GFRNONAA 85 04/17/2016 1104   GFRAA >60 08/25/2017 0118   GFRAA >89 04/17/2016 1104    Renal function: CrCl cannot be calculated (Patient's most recent lab result is older than the maximum 21 days allowed.).  A/P: Hypertension longstanding currently uncontrolled on current medications. BP Goal <130/80 mmHg. Patient is adherent with current medications. With symptoms and in-clinic pressure, will have patient seen by provider.   Results reviewed and  written information provided.   Total time in face-to-face counseling 15 minutes.  Patient seen with:  Marylene Buerger, PharmD Candidate [employer] Class of 2021  Benard Halsted, PharmD, [place] [phone]

## 2017-10-27 NOTE — Progress Notes (Signed)
Patient here for lab visit only 

## 2017-10-28 LAB — SPECIMEN STATUS REPORT

## 2017-10-28 LAB — BASIC METABOLIC PANEL
BUN/Creatinine Ratio: 21 (ref 12–28)
BUN: 18 mg/dL (ref 8–27)
CO2: 22 mmol/L (ref 20–29)
Calcium: 9.2 mg/dL (ref 8.7–10.3)
Chloride: 104 mmol/L (ref 96–106)
Creatinine, Ser: 0.85 mg/dL (ref 0.57–1.00)
GFR calc Af Amer: 86 mL/min/{1.73_m2} (ref >59)
GFR calc non Af Amer: 74 mL/min/{1.73_m2} (ref >59)
Glucose: 85 mg/dL (ref 65–99)
Potassium: 3.9 mmol/L (ref 3.5–5.2)
Sodium: 146 mmol/L — ABNORMAL HIGH (ref 134–144)

## 2017-10-29 ENCOUNTER — Other Ambulatory Visit: Payer: Medicare HMO

## 2017-11-03 ENCOUNTER — Ambulatory Visit: Payer: Medicare HMO | Admitting: Cardiology

## 2017-11-03 ENCOUNTER — Encounter: Payer: Self-pay | Admitting: Cardiology

## 2017-11-03 VITALS — BP 162/99 | HR 73 | Ht 65.0 in | Wt 174.0 lb

## 2017-11-03 DIAGNOSIS — G8929 Other chronic pain: Secondary | ICD-10-CM | POA: Insufficient documentation

## 2017-11-03 DIAGNOSIS — I70219 Atherosclerosis of native arteries of extremities with intermittent claudication, unspecified extremity: Secondary | ICD-10-CM | POA: Diagnosis not present

## 2017-11-03 DIAGNOSIS — R0789 Other chest pain: Secondary | ICD-10-CM | POA: Diagnosis not present

## 2017-11-03 DIAGNOSIS — I7409 Other arterial embolism and thrombosis of abdominal aorta: Secondary | ICD-10-CM | POA: Diagnosis not present

## 2017-11-03 DIAGNOSIS — E785 Hyperlipidemia, unspecified: Secondary | ICD-10-CM

## 2017-11-03 DIAGNOSIS — I10 Essential (primary) hypertension: Secondary | ICD-10-CM

## 2017-11-03 DIAGNOSIS — R0609 Other forms of dyspnea: Secondary | ICD-10-CM

## 2017-11-03 DIAGNOSIS — R0600 Dyspnea, unspecified: Secondary | ICD-10-CM

## 2017-11-03 MED ORDER — METOPROLOL TARTRATE 50 MG PO TABS: 50.0000 mg | ORAL_TABLET | Freq: Once | ORAL | 0 refills | Status: DC

## 2017-11-03 MED ORDER — CARVEDILOL 6.25 MG PO TABS: 6.2500 mg | ORAL_TABLET | Freq: Two times a day (BID) | ORAL | 3 refills | Status: DC

## 2017-11-03 MED ORDER — ATORVASTATIN CALCIUM 40 MG PO TABS: 40.0000 mg | ORAL_TABLET | Freq: Every day | ORAL | 3 refills | Status: DC

## 2017-11-03 MED ORDER — AMLODIPINE BESYLATE 10 MG PO TABS: 10.0000 mg | ORAL_TABLET | Freq: Every day | ORAL | 3 refills | Status: DC

## 2017-11-03 MED FILL — ATORVASTATIN 40 MG TABLET: 40 | 90 days supply | Qty: 90 | Fill #0

## 2017-11-03 MED FILL — AMLODIPINE BESYLATE 10 MG T: 10 | 90 days supply | Qty: 90 | Fill #0

## 2017-11-03 MED FILL — CARVEDILOL 6.25 MG TABLET: 6.25 | 90 days supply | Qty: 180 | Fill #0

## 2017-11-03 MED FILL — METOPROLOL TARTRATE 50 MG T: 50 | 1 days supply | Qty: 1 | Fill #0

## 2017-11-03 NOTE — Progress Notes (Signed)
PCP: [name], MD  Dr. Scot Dock - V[facility]  Clinic Note: Chief Complaint  Patient presents with  . Follow-up    PAD & CRFs - with DOE  . Claudication    cramps right foot, alternates feet    HPI: Brandy Stevenson is a 62 y.o. female with a PMH (hypertension, hyperlipidemia, former heavy smoker, PAD with Leriche syndrome, COPD, GERD, status post lumpectomy for breast cancer in 2009) who presents today for 2 MONTH F/U   She has a relatively significant family history of CAD noted below.  Recent Hospitalizations:   August 22, 2017, ER visit for leg pain.  --> She was subsequently evaluated by Dr. Sherren Mocha Early from vascular surgery for what sounded like worsening bilateral lower extremity discomfort/claudication..That time he considered that she may very well require aortobifem bypass.  August 25, 2017 ER visit for chest wall pain worse with palpation.  Adrian Specht was last seen on 09/02/2017 by Fabian Sharp, PA - CP Eval - Pre-op Clearance for possible aortobifem bypass.  She had recently been seen in the ER for chest pain there was really left sided breast pain exertion with movement and left-sided chest palpation.  She ruled out for MI and was scheduled for follow-up.  Studies Personally Reviewed - (if available, images/films reviewed: From Epic Chart or Care Everywhere)  TTE 08/2017: EF 60-65%. No RWMA.  Normal DF.  Normal valves.   Aortic Angiogram 10/22/2015: Aortic arch widely patent as far as innominate artery, right subclavian artery, right vertebral artery and right, common carotid.  Nonocclusive disease.  Bilateral single renal arteries no stenosis.  Infrarenal aorta is occluded down to the common iliac arteries.  There is reconsultation of the external iliacs bilaterally via hypogastric (external iliac) arteries; Right common femoral, deep femoral and superficial femoral patent.  Focal distal popliteal calcific plaque with patent anterior and posterior tibial arteries.   Focal plaque in the proximal peroneal artery.  Left common femoral, deep femoral, superficial femoral, popliteal, TP trunk patent with three-vessel runoff.  Interval History: Brandy Stevenson presents today indicating that the decision has been made to not have her aortobifem surgery at this point.  Her symptoms seem to have been improving. She does not do much walking mostly because of neuropathic pains and is claudication, however she does note exertional dyspnea.  She has strange chest pain previously described is worse with certain movements and palpation.  Not necessarily worse with exertion. She has intermittent swelling, nothing significant.  No PND orthopnea. Denies any rapid irregular heartbeats palpitations.  No headache blurred vision or dizziness, wooziness, syncope/near syncope or TIA/amaurosis fugax.  No melena, hematochezia, hematuria, or epstaxis. She does note having claudication, but it seems to be relatively controlled.  Claudication.  ROS: A comprehensive was performed. Review of Systems  Constitutional: Positive for malaise/fatigue (No energy).  HENT: Negative for congestion and nosebleeds.   Respiratory: Positive for shortness of breath (Only with exertion).   Cardiovascular: Positive for chest pain (Per HPI) and claudication.       Per HPI  Neurological: Positive for dizziness, tingling (Neuropathic pain ) and weakness (Prophylaxing week). Negative for focal weakness.  Psychiatric/Behavioral: The patient is nervous/anxious.   All other systems reviewed and are negative.    I have reviewed and (if needed) personally updated the patient's problem list, medications, allergies, past medical and surgical history, social and family history.   Past Medical History:  Diagnosis Date  . Arthritis    back problems to be evaluated by  neurology next month  . Cancer ([place]) 2009   L, Breast, radiation and surgery.  [name] COPD (chronic obstructive pulmonary disease) ([place])   . Depression   .  GERD (gastroesophageal reflux disease)   . Hyperlipemia   . Hypertension   . Leriche syndrome ([place])    Infrarenal aortic occlusion extending to bilateral internal iliac arteries.  External iliacs fill via collaterals from lumbar arteries to hypogastric/internal iliac arteries -minimal disease on the right side and no disease on the left side leg arteries distally.  . Peripheral vascular disease ([place]) 2013    Past Surgical History:  Procedure Laterality Date  . APPENDECTOMY  1980  . BREAST LUMPECTOMY Left    s/p radiation therapy   . BREAST SURGERY    . COLONOSCOPY  07/2015  . COLONOSCOPY WITH PROPOFOL N/A 06/26/2015   Procedure: COLONOSCOPY WITH PROPOFOL;  Surgeon: [name], MD;  Location: [facility] END[facility]COPY;  Service: Endoscopy;  Laterality: N/A;  . PERIPHERAL VASCULAR CATHETERIZATION N/A 10/22/2015   Procedure: Abdominal Aortogram w/Lower Extremity;  Surgeon: [name], MD;  Location: [facility] INVASIVE CV LAB: Aortic arch widely patent as far as innominate artery, right subclavian artery, right vertebral artery and right, common carotid.  Bilateral single renal arteries - Nonocclusive disease. Infrarenal aorta 100% - bilateral common Iliac 100% --> B Ex Iliacs fill via Int Iliac. Leg OK  . TRANSTHORACIC ECHOCARDIOGRAM  08/2017   EF 60-65%. No RWMA.  Normal DF.  Normal valves.   . TUBAL LIGATION  1991    Current Meds  Medication Sig  . albuterol (PROVENTIL HFA;VENTOLIN HFA) 108 (90 Base) MCG/ACT inhaler Inhale 2 puffs every 6 (six) hours as needed into the lungs for wheezing.  [name] ammonium lactate (LAC-HYDRIN) 12 % cream Apply topically as needed for dry skin.  [name] aspirin EC 81 MG tablet Take 1 tablet (81 mg total) by mouth daily.  . cyclobenzaprine (FLEXERIL) 5 MG tablet Take 1 tablet (5 mg total) by mouth 2 (two) times daily as needed for muscle spasms.  . Fluticasone-Salmeterol (ADVAIR) 100-50 MCG/DOSE AEPB Inhale 1 puff into the lungs 2 (two) times daily.  [name] gabapentin  (NEURONTIN) 300 MG capsule Take by mouth 300 mg in the day as needed for pain and 300 mg at night (Patient taking differently: Take 300 mg by mouth at bedtime. )  . lisinopril (PRINIVIL,ZESTRIL) 20 MG tablet Take 1 tablet (20 mg total) by mouth daily.  [name] loperamide (IMODIUM A-D) 2 MG tablet Take 1 tablet (2 mg total) by mouth every 8 (eight) hours as needed for diarrhea or loose stools.  . meloxicam (MOBIC) 15 MG tablet Take 1 tablet (15 mg total) by mouth daily.  [name] omeprazole (PRILOSEC) 40 MG capsule Take 1 capsule (40 mg total) by mouth daily.  [name] spironolactone (ALDACTONE) 25 MG tablet Take 1 tablet (25 mg total) by mouth daily.  . [DISCONTINUED] amLODipine (NORVASC) 5 MG tablet Take 1 tablet (5 mg total) by mouth daily.  . [DISCONTINUED] atorvastatin (LIPITOR) 20 MG tablet Take 1 tablet (20 mg total) by mouth daily.   Current Facility-Administered Medications for the 11/03/17 encounter (Office Visit) with [name], MD  Medication  . cloNIDine (CATAPRES) tablet 0.1 mg    Allergies  Allergen Reactions  . Vicodin [Hydrocodone-Acetaminophen] Nausea And Vomiting and Other (See Comments)    Sweating  . Wellbutrin [Bupropion]     Made me feel bad    Social History   Tobacco Use  . Smoking status: Former Smoker  Packs/day: 0.25    Years: 40.00    Pack years: 10.00    Types: Cigarettes    Last attempt to quit: 02/08/2017    Years since quitting: 0.7  . Smokeless tobacco: Never Used  . Tobacco comment: 3 cigarettes per day  Substance Use Topics  . Alcohol use: No  . Drug use: No   Social History   Social History Narrative  . Not on file   Family History: Brother died of a heart attack at age 63. family history includes Heart attack in her brother; Heart disease in her brother, mother, and sister; Hyperlipidemia in her brother, mother, and sister; Hypertension in her brother, daughter, mother, and sister.  Wt Readings from Last 3 Encounters:  11/03/17 174 lb (78.9 kg)    10/27/17 173 lb 6.4 oz (78.7 kg)  10/19/17 175 lb (79.4 kg)    PHYSICAL EXAM BP (!) 162/99   Pulse 73   Ht 5\' 5"  (1.651 m)   Wt 174 lb (78.9 kg)   SpO2 99%   BMI 28.96 kg/m  Physical Exam  Constitutional: She is oriented to person, place, and time. She appears well-developed and well-nourished. No distress.  Well-groomed  HENT:  Head: Normocephalic and atraumatic.  Eyes: Conjunctivae and EOM are normal.  Neck: Normal range of motion. Neck supple. No hepatojugular reflux and no JVD present. Carotid bruit is present (Soft bilateral bruits).  Cardiovascular: Normal rate and regular rhythm.  No extrasystoles are present. PMI is not displaced. Exam reveals decreased pulses (Bilateral pedal pulses are palpable, but diminished.). Exam reveals no gallop and no S4.  Pulmonary/Chest: Effort normal and breath sounds normal. No respiratory distress. She has no wheezes. She has no rales.  Abdominal: Soft. Bowel sounds are normal. She exhibits no distension. There is no tenderness.  No HSM  Musculoskeletal: Normal range of motion. She exhibits no edema.  Neurological: She is alert and oriented to person, place, and time. No cranial nerve deficit.  Psychiatric: She has a normal mood and affect. Her behavior is normal. Judgment and thought content normal.  Vitals reviewed.    Adult ECG Report Not checked  Other studies Reviewed: Additional studies/ records that were reviewed today include:  Recent Labs:   Lab Results  Component Value Date   CHOL 161 10/03/2016   HDL 51 10/03/2016   LDLCALC 84 10/03/2016   TRIG 130 10/03/2016   CHOLHDL 3.2 10/03/2016   Lab Results  Component Value Date   CREATININE 0.85 10/27/2017   BUN 18 10/27/2017   NA 146 (H) 10/27/2017   K CANCELED 10/27/2017   K 3.9 10/27/2017   CL 104 10/27/2017   CO2 22 10/27/2017   ASSESSMENT / PLAN: Problem List Items Addressed This Visit    Accelerated hypertension (Chronic)    Blood pressure is quite high today.   In lieu of her having notable vascular disease, we will need to be more aggressive. Plan: Increase amlodipine to 10 mg daily and start carvedilol 6.25 mg twice daily      Relevant Medications   carvedilol (COREG) 6.25 MG tablet   amLODipine (NORVASC) 10 MG tablet   atorvastatin (LIPITOR) 40 MG tablet   Other Relevant Orders   CT CORONARY MORPH W/CTA COR W/SCORE W/CA W/CM &/OR WO/CM   CT CORONARY FRACTIONAL FLOW RESERVE DATA PREP   CT CORONARY FRACTIONAL FLOW RESERVE FLUID ANALYSIS   Aortoiliac occlusive disease ([place]) (Chronic)    She really has more central vascular disease and peripheral vascular disease.  Her claudication is related to [place] syndrome.  This is been known now for 2 years and it seems like they are probably not going to think about aortobifem at this point. Since we are not pending operations anytime soon, we can go ahead and proceed with ischemic evaluation for possible coronary disease.      Relevant Medications   carvedilol (COREG) 6.25 MG tablet   amLODipine (NORVASC) 10 MG tablet   atorvastatin (LIPITOR) 40 MG tablet   Atherosclerotic PVD with intermittent claudication (HCC) - Primary (Chronic)    Followed by vascular surgery.  It sounds like plans for now her medical management.  Will need aggressive risk factor modification including blood pressure and lipid control.      Relevant Medications   carvedilol (COREG) 6.25 MG tablet   amLODipine (NORVASC) 10 MG tablet   atorvastatin (LIPITOR) 40 MG tablet   Other Relevant Orders   Lipid panel   Hepatic function panel   Basic metabolic panel   CT CORONARY MORPH W/CTA COR W/SCORE W/CA W/CM &/OR WO/CM   CT CORONARY FRACTIONAL FLOW RESERVE DATA PREP   CT CORONARY FRACTIONAL FLOW RESERVE FLUID ANALYSIS   Atypical chest pain (Chronic)    She has several different types of chest pain one clearly sounds musculoskeletal, however she also describes having spells of some tightness in her chest with dyspnea.  I asked  the question 3 times and got a positive answer 1 out of 3 times.  Plan: Need to exclude coronary ischemia.  Stress test not a good option.  Best option would be coronary CT angiogram.      Relevant Orders   Basic metabolic panel   CT CORONARY MORPH W/CTA COR W/SCORE W/CA W/CM &/OR WO/CM   CT CORONARY FRACTIONAL FLOW RESERVE DATA PREP   CT CORONARY FRACTIONAL FLOW RESERVE FLUID ANALYSIS   DOE (dyspnea on exertion)    Patient with a normal echocardiogram and persistent exertional dyspnea, we need to exclude coronary ischemia.  She is clearly has cardiac risk factors and has aortic occlusive disease. Thus Plan: Coronary CT angiogram to assess for any ischemic CAD. (Myoview stress test would not be a very viable option as she is not able to walk, and she is very large heavy breasts that would lead to breast attenuation)      Relevant Orders   Basic metabolic panel   CT CORONARY MORPH W/CTA COR W/SCORE W/CA W/CM &/OR WO/CM   CT CORONARY FRACTIONAL FLOW RESERVE DATA PREP   CT CORONARY FRACTIONAL FLOW RESERVE FLUID ANALYSIS   Hyperlipidemia with target LDL less than 70 (Chronic)    With severe occlusive aortoiliac disease, this is basically a CAD risk equivalent and therefore our target LDL should be close to CAD goal of less than 70 closer to 50. Plan: Increase atorvastatin to 40 mg daily and recheck lipids after about 3 months.      Relevant Medications   carvedilol (COREG) 6.25 MG tablet   amLODipine (NORVASC) 10 MG tablet   atorvastatin (LIPITOR) 40 MG tablet   Other Relevant Orders   Lipid panel   Hepatic function panel   Basic metabolic panel      I spent a total of 30 minutes with the patient and chart review. >  50% of the time was spent in direct patient consultation.   Current medicines are reviewed at length with the patient today.  (+/- concerns) none The following changes have been made:  See below  Patient Instructions  MEDICATION  INSTRUCTIONS   -- START-  CARVEDILOL 6.25 MG ONE TABLET TWICE A DAY  --- INCREASE ATORVASTATIN 40 MG TAKE AT BEDITME  --- INCREASE AMLODIPINE 10 MG ONE TABLET TAKE AT BEDTIME  --- LISINOPRIL  20 MG  ONE TABLET IN THE MORNING  LABS IN NOV 2019 BEFORE OFFICE VISIT -- WILL MAIL LABSLIP LIPID  HEPATIC PANEL  Your physician recommends that you schedule a follow-up appointment in 2 MONTHS WITH DR Rontavious Albright.   Studies Ordered:   Orders Placed This Encounter  Procedures  . CT CORONARY MORPH W/CTA COR W/SCORE W/CA W/CM &/OR WO/CM  . CT CORONARY FRACTIONAL FLOW RESERVE DATA PREP  . CT CORONARY FRACTIONAL FLOW RESERVE FLUID ANALYSIS  . Lipid panel  . Hepatic function panel  . Basic metabolic panel      Glenetta Hew, M.D., M.S. Interventional Cardiologist   Pager # [phone] Phone # [phone] [address]. [place], [place] [address]   Thank you for choosing [facility] at [facility]!!

## 2017-11-03 NOTE — Patient Instructions (Addendum)
MEDICATION INSTRUCTIONS   -- START- CARVEDILOL 6.25 MG ONE TABLET TWICE A DAY  --- INCREASE ATORVASTATIN 40 MG TAKE AT BEDITME  --- INCREASE AMLODIPINE 10 MG ONE TABLET TAKE AT BEDTIME  --- LISINOPRIL  20 MG  ONE TABLET IN THE MORNING   LABS IN NOV 2019 BEFORE OFFICE VISIT -- WILL MAIL LABSLIP LIPID  HEPATIC PANEL   SCHEDULE AT  [place] physician has requested that you have CORONARY  CTA. Cardiac computed tomography (CT) is a painless test that uses an x-ray machine to take clear, detailed pictures of your heart. For further information please visit [email or web address]. Please follow instruction sheet as given.PLEASE HAVE BMP DONE 1 WEEK BEFORE TEST   Your physician recommends that you schedule a follow-up appointment in 2 MONTHS WITH DR HARDING.    INSTRUCTIONS FOR  CORONARY CTA    Please arrive at the [facility] main entrance of [facility] at (30-45 minutes prior to test start time)  [facility] [place], [place] [address] [phone]  Proceed to the [facility] Radiology Department (First Floor).  Please follow these instructions carefully (unless otherwise directed):  PLEASE HAVE LABS - BMP  AT LEAST ONE WEEK PRIOR TO TEST  On the Night Before the Test: . Drink plenty of water. . Do not consume any caffeinated/decaffeinated beverages or chocolate 12 hours prior to your test. . Do not take any antihistamines 12 hours prior to your test.    On the Day of the Test: . Drink plenty of water. Do not drink any water within one hour of the test. . Do not eat any food 4 hours prior to the test. . You may take your regular medications prior to the test. . Take 50 mg of lopressor (metoprolol) one hour before the test. ( DO NOT TAKE TAKE CARVEDILOL THE MORNING OF THE TEST)   After the Test: . Drink plenty of water. . After receiving IV contrast, you may experience a mild flushed feeling. This is normal. . On  occasion, you may experience a mild rash up to 24 hours after the test. This is not dangerous. If this occurs, you can take Benadryl 25 mg and increase your fluid intake. . If you experience trouble breathing, this can be serious. If it is severe call 911 IMMEDIATELY. If it is mild, please call our office.

## 2017-11-04 ENCOUNTER — Ambulatory Visit: Payer: Medicare HMO | Admitting: Vascular Surgery

## 2017-11-04 ENCOUNTER — Encounter: Payer: Self-pay | Admitting: Cardiology

## 2017-11-04 DIAGNOSIS — I7409 Other arterial embolism and thrombosis of abdominal aorta: Secondary | ICD-10-CM | POA: Insufficient documentation

## 2017-11-04 NOTE — Assessment & Plan Note (Signed)
Followed by vascular surgery.  It sounds like plans for now her medical management.  Will need aggressive risk factor modification including blood pressure and lipid control.

## 2017-11-04 NOTE — Assessment & Plan Note (Signed)
Blood pressure is quite high today.  In lieu of her having notable vascular disease, we will need to be more aggressive. Plan: Increase amlodipine to 10 mg daily and start carvedilol 6.25 mg twice daily

## 2017-11-04 NOTE — Assessment & Plan Note (Signed)
With severe occlusive aortoiliac disease, this is basically a CAD risk equivalent and therefore our target LDL should be close to CAD goal of less than 70 closer to 50. Plan: Increase atorvastatin to 40 mg daily and recheck lipids after about 3 months.

## 2017-11-04 NOTE — Assessment & Plan Note (Signed)
She has several different types of chest pain one clearly sounds musculoskeletal, however she also describes having spells of some tightness in her chest with dyspnea.  I asked the question 3 times and got a positive answer 1 out of 3 times.  Plan: Need to exclude coronary ischemia.  Stress test not a good option.  Best option would be coronary CT angiogram.

## 2017-11-04 NOTE — Assessment & Plan Note (Signed)
Patient with a normal echocardiogram and persistent exertional dyspnea, we need to exclude coronary ischemia.  She is clearly has cardiac risk factors and has aortic occlusive disease. Thus Plan: Coronary CT angiogram to assess for any ischemic CAD. (Myoview stress test would not be a very viable option as she is not able to walk, and she is very large heavy breasts that would lead to breast attenuation)

## 2017-11-04 NOTE — Assessment & Plan Note (Signed)
She really has more central vascular disease and peripheral vascular disease.  Her claudication is related to [place] syndrome.  This is been known now for 2 years and it seems like they are probably not going to think about aortobifem at this point. Since we are not pending operations anytime soon, we can go ahead and proceed with ischemic evaluation for possible coronary disease.

## 2017-11-06 ENCOUNTER — Other Ambulatory Visit: Payer: Medicare HMO

## 2017-11-11 ENCOUNTER — Encounter (HOSPITAL_COMMUNITY): Payer: Medicare HMO

## 2017-11-11 ENCOUNTER — Ambulatory Visit: Payer: Medicare HMO | Admitting: Vascular Surgery

## 2017-11-17 ENCOUNTER — Ambulatory Visit: Payer: Medicare HMO | Attending: Internal Medicine | Admitting: Pharmacist

## 2017-11-17 VITALS — BP 149/86 | HR 57

## 2017-11-17 DIAGNOSIS — I10 Essential (primary) hypertension: Secondary | ICD-10-CM | POA: Insufficient documentation

## 2017-11-17 MED ORDER — LISINOPRIL 40 MG PO TABS: 40.0000 mg | ORAL_TABLET | Freq: Every day | ORAL | 2 refills | Status: DC

## 2017-11-17 MED FILL — LISINOPRIL 40 MG TABLET: 40 | 30 days supply | Qty: 30 | Fill #0

## 2017-11-17 NOTE — Patient Instructions (Signed)
Thank you for coming to see [place] today.   Blood pressure today is improving.   I am increasing lisinopril to 40 mg daily. I have sent this to the pharmacy.   Blood pressure medications now include:  Amlodipine 10 mg daily Carvedilol 6.25 mg BID Lisinopril 40 mg daily Spironolactone 25 mg daily  Limiting salt and caffeine, as well as exercising as able for at least 30 minutes for 5 days out of the week, can also help you lower your blood pressure.  Take your blood pressure at home if you are able. Please write down these numbers and bring them to your visits.  If you have any questions about medications, please call me [phone].  Lurena Joiner

## 2017-11-17 NOTE — Progress Notes (Signed)
   S:    PCP: Dr. Wynetta Emery  Presents to the clinic for hypertension management. Patient was referred and last seen by Dr. Wynetta Emery 10/06/17. She recently saw the Cardiologist (11/04/17). BP was elevated at that encounter. Carvedilol started at 6.25mg  BID and amlodipine was increased from 5 to 10 mg daily. Other anti-hypertensives continued.   Today, pt denies CP, SOB, HA or blurred vision. Denies problems with medications.  Patient reports adherence with medications.  Current BP Medications include:   - amlodipine 10 mg daily - carvedilol 6.25 mg BID - lisinopril 20 mg daily - spironolactone 25 mg daily  Home BP readings since Cardio visit:  SBPs: 125 -154 DBPs: 79 - 99   O:  L arm after 5 minutes of rest: 149/86; HR 57  Last 3 Office BP readings: BP Readings from Last 3 Encounters:  11/03/17 (!) 162/99  10/27/17 (!) 208/105  10/19/17 (!) 177/101    BMET    Component Value Date/Time   NA 146 (H) 10/27/2017 1048   K CANCELED 10/27/2017 1048   K 3.9 10/27/2017 1048   CL 104 10/27/2017 1048   CO2 22 10/27/2017 1048   GLUCOSE 85 10/27/2017 1048   GLUCOSE 94 09/29/2017 0526   BUN 18 10/27/2017 1048   CREATININE 0.85 10/27/2017 1048   CREATININE 0.77 04/17/2016 1104   CALCIUM 9.2 10/27/2017 1048   GFRNONAA 74 10/27/2017 1048   GFRNONAA 85 04/17/2016 1104   GFRAA 86 10/27/2017 1048   GFRAA >89 04/17/2016 1104    Renal function: Estimated Creatinine Clearance: 72.2 mL/min (by C-G formula based on SCr of 0.85 mg/dL).  A/P: Hypertension longstanding uncontrolled but improving on current medications. BP Goal <130/80 mmHg. Patient is adherent with current medications. Labs reviewed and discussed with patient's PCP. Will have her increase lisinopril and follow-up with me in 1 month.   -Increased lisinopril from 20 to 40 mg daily -Continued other anti-hypertensive medications -Patient with recent BMP taken; recommend retaking at 01/07/18 PCP encounter d/t lisinopril  increase  Results reviewed and written information provided.   Total time in face-to-face counseling 15 minutes. F/u with me on 12/17/17  Patient seen with:  Marylene Buerger, PharmD Candidate [employer] of 2021  Benard Halsted, PharmD, [place] [phone]

## 2017-11-26 ENCOUNTER — Other Ambulatory Visit: Payer: Medicare HMO

## 2017-12-02 ENCOUNTER — Other Ambulatory Visit: Payer: Self-pay

## 2017-12-02 DIAGNOSIS — G8929 Other chronic pain: Secondary | ICD-10-CM

## 2017-12-02 DIAGNOSIS — I10 Essential (primary) hypertension: Secondary | ICD-10-CM

## 2017-12-02 DIAGNOSIS — M545 Low back pain, unspecified: Secondary | ICD-10-CM

## 2017-12-02 MED ORDER — LISINOPRIL 40 MG PO TABS: 40.0000 mg | ORAL_TABLET | Freq: Every day | ORAL | 0 refills | Status: DC

## 2017-12-02 MED ORDER — SPIRONOLACTONE 25 MG PO TABS: 25.0000 mg | ORAL_TABLET | Freq: Every day | ORAL | 0 refills | Status: DC

## 2017-12-03 MED ORDER — CYCLOBENZAPRINE HCL 5 MG PO TABS: 5.0000 mg | ORAL_TABLET | Freq: Two times a day (BID) | ORAL | 0 refills | Status: DC | PRN

## 2017-12-03 NOTE — Telephone Encounter (Signed)
Patient requesting refill on Flexeril that was prescribed by emergency medicine physician back in July 2019.  Limited refill given until next visit.

## 2017-12-09 ENCOUNTER — Other Ambulatory Visit: Payer: Self-pay | Admitting: Internal Medicine

## 2017-12-09 DIAGNOSIS — I70219 Atherosclerosis of native arteries of extremities with intermittent claudication, unspecified extremity: Secondary | ICD-10-CM

## 2017-12-09 MED ORDER — GABAPENTIN 300 MG PO CAPS: 300.0000 mg | ORAL_CAPSULE | Freq: Every day | ORAL | 1 refills | Status: DC

## 2017-12-10 ENCOUNTER — Ambulatory Visit (HOSPITAL_COMMUNITY)
Admission: RE | Admit: 2017-12-10 | Discharge: 2017-12-10 | Disposition: A | Discharge status: Home / Self Care | Payer: Medicare HMO | Source: Ambulatory Visit | Attending: Cardiology | Admitting: Cardiology

## 2017-12-10 ENCOUNTER — Encounter: Payer: Medicare HMO | Admitting: *Deleted

## 2017-12-10 ENCOUNTER — Encounter (HOSPITAL_COMMUNITY): Payer: Self-pay

## 2017-12-10 ENCOUNTER — Ambulatory Visit (HOSPITAL_COMMUNITY): Payer: Medicare HMO

## 2017-12-10 ENCOUNTER — Telehealth: Payer: Self-pay | Admitting: Cardiology

## 2017-12-10 DIAGNOSIS — R0609 Other forms of dyspnea: Secondary | ICD-10-CM | POA: Insufficient documentation

## 2017-12-10 DIAGNOSIS — Z006 Encounter for examination for normal comparison and control in clinical research program: Secondary | ICD-10-CM

## 2017-12-10 DIAGNOSIS — I10 Essential (primary) hypertension: Secondary | ICD-10-CM | POA: Diagnosis not present

## 2017-12-10 DIAGNOSIS — I70219 Atherosclerosis of native arteries of extremities with intermittent claudication, unspecified extremity: Secondary | ICD-10-CM | POA: Diagnosis not present

## 2017-12-10 DIAGNOSIS — R0789 Other chest pain: Secondary | ICD-10-CM | POA: Diagnosis not present

## 2017-12-10 DIAGNOSIS — I700 Atherosclerosis of aorta: Secondary | ICD-10-CM | POA: Diagnosis not present

## 2017-12-10 DIAGNOSIS — R079 Chest pain, unspecified: Secondary | ICD-10-CM | POA: Diagnosis present

## 2017-12-10 DIAGNOSIS — R0600 Dyspnea, unspecified: Secondary | ICD-10-CM

## 2017-12-10 HISTORY — PX: CT CTA CORONARY W/CA SCORE W/CM &/OR WO/CM: HXRAD787

## 2017-12-10 LAB — POCT I-STAT CREATININE: Creatinine, Ser: 0.9 mg/dL (ref 0.44–1.00)

## 2017-12-10 MED ORDER — IOPAMIDOL (ISOVUE-370) INJECTION 76%
100.0000 mL | Freq: Once | INTRAVENOUS | Status: AC | PRN
  Administered 2017-12-10: 80 mL via INTRAVENOUS

## 2017-12-10 MED ORDER — NITROGLYCERIN 0.4 MG SL SUBL
0.8000 mg | SUBLINGUAL_TABLET | Freq: Once | SUBLINGUAL | Status: AC
  Administered 2017-12-10: 0.8 mg via SUBLINGUAL
  Filled 2017-12-10: qty 25

## 2017-12-10 MED ORDER — NITROGLYCERIN 0.4 MG SL SUBL
SUBLINGUAL_TABLET | SUBLINGUAL | Status: AC
  Filled 2017-12-10: qty 2

## 2017-12-10 NOTE — Progress Notes (Signed)
Patient denies any complaints. Patient having snack at this time.

## 2017-12-10 NOTE — Research (Signed)
Ms. Brandy Stevenson met criteria for the CADFEM trial. The trial was explained to the patient including risk/benefits. Ms Brandy Stevenson had the opportunity to read the consent and ask questions. The consent was signed and a copy was given to the patient. No study related procedures were performed prior to the consent being signed at 10:00am.

## 2017-12-10 NOTE — Telephone Encounter (Signed)
New Message   Patient would like a call back about her procedure this morning (cardiac ct)

## 2017-12-10 NOTE — Telephone Encounter (Signed)
Returned call to patient.She stated she is scheduled to have cardiac ct this morning.She received a call from hospital this morning saying they have a new camera and pictures will be taken with a 7 lead.She wanted to make sure that was ok.Advised that is ok, follow directions that was previously given.

## 2017-12-17 ENCOUNTER — Encounter: Payer: Medicare HMO | Admitting: Pharmacist

## 2017-12-18 ENCOUNTER — Encounter: Payer: Self-pay | Admitting: Pharmacist

## 2017-12-18 ENCOUNTER — Ambulatory Visit: Payer: Medicare HMO | Attending: Family Medicine | Admitting: Pharmacist

## 2017-12-18 VITALS — BP 156/95 | HR 57

## 2017-12-18 DIAGNOSIS — I10 Essential (primary) hypertension: Secondary | ICD-10-CM | POA: Insufficient documentation

## 2017-12-18 DIAGNOSIS — Z79899 Other long term (current) drug therapy: Secondary | ICD-10-CM | POA: Diagnosis not present

## 2017-12-18 NOTE — Progress Notes (Signed)
   S:    PCP: Dr. Wynetta Emery  Presents to the clinic for hypertension management. Patient was referred and last seen by Dr. Wynetta Emery 10/06/17. She recently saw her Cardiologist (11/04/17). At her last visit with me on 11/17/17, BP was 149/86. Lisinopril increased to 40 mg daily.  Today, pt denies CP, SOB, or blurred vision. Denies problems with medications. Does endorse generalized headaches.   Patient reports adherence with medications.  Current BP Medications include:   - amlodipine 10 mg daily - carvedilol 6.25 mg BID - lisinopril 40 mg daily - spironolactone 25 mg daily  Home BP readings: SBPs: 120 - 148 DBPs: 68 - 93 Majority of pressures in last 30 days in 130s/80s. Only 6 SBPs of 140 or above. Only 10 DBPs of 90 or above.  O:  L arm after 5 minutes of rest: 156/95; HR 57   Last 3 Office BP readings: BP Readings from Last 3 Encounters:  12/10/17 (!) 158/93  11/17/17 (!) 149/86  11/03/17 (!) 162/99   BMET    Component Value Date/Time   NA 146 (H) 10/27/2017 1048   K CANCELED 10/27/2017 1048   K 3.9 10/27/2017 1048   CL 104 10/27/2017 1048   CO2 22 10/27/2017 1048   GLUCOSE 85 10/27/2017 1048   GLUCOSE 94 09/29/2017 0526   BUN 18 10/27/2017 1048   CREATININE 0.90 12/10/2017 1156   CREATININE 0.77 04/17/2016 1104   CALCIUM 9.2 10/27/2017 1048   GFRNONAA 74 10/27/2017 1048   GFRNONAA 85 04/17/2016 1104   GFRAA 86 10/27/2017 1048   GFRAA >89 04/17/2016 1104    Renal function: CrCl cannot be calculated (Unknown ideal weight.).  A/P: Hypertension longstanding uncontrolled.. BP Goal <130/80 mmHg. Patient is adherent with current medications. Her home pressures are improving but clinic value is above goal. She has not taken her evening dose of carvedilol or lisinopril today. Will have her continue and f/u with Cardiologist. I have reached out to Cardiology to determine if more immediate changes to therapy should be made.  -Continued anti-hypertensive medications -Message  sent to Dr. Ellyn Hack  Results reviewed and written information provided.  Total time in face-to-face counseling 15 minutes. F/u with Cardiologist 01/06/18.  Patient seen with:  Dixon Boos, PharmD Candidate [employer] 2021  Benard Halsted, PharmD, [place] [phone]

## 2017-12-18 NOTE — Patient Instructions (Signed)
Thank you for coming to see [place] today.   Blood pressure today is a little elevated.  Continue taking blood pressure medications as prescribed.   Limiting salt and caffeine, as well as exercising as able for at least 30 minutes for 5 days out of the week, can also help you lower your blood pressure.  Take your blood pressure at home if you are able. Please write down these numbers and bring them to your visits.  If you have any questions about medications, please call me [phone].  Brandy Stevenson

## 2018-01-06 ENCOUNTER — Ambulatory Visit (INDEPENDENT_AMBULATORY_CARE_PROVIDER_SITE_OTHER): Payer: Medicare HMO | Admitting: Cardiology

## 2018-01-06 ENCOUNTER — Encounter: Payer: Self-pay | Admitting: Cardiology

## 2018-01-06 VITALS — BP 151/93 | HR 63 | Ht 65.0 in | Wt 174.0 lb

## 2018-01-06 DIAGNOSIS — R0789 Other chest pain: Secondary | ICD-10-CM

## 2018-01-06 DIAGNOSIS — I10 Essential (primary) hypertension: Secondary | ICD-10-CM

## 2018-01-06 DIAGNOSIS — I7409 Other arterial embolism and thrombosis of abdominal aorta: Secondary | ICD-10-CM | POA: Diagnosis not present

## 2018-01-06 DIAGNOSIS — E785 Hyperlipidemia, unspecified: Secondary | ICD-10-CM

## 2018-01-06 NOTE — Progress Notes (Signed)
PCP: [name], MD  Clinic Note: Chief Complaint  Patient presents with  . Follow-up    CT scan results  . PAD  . Shortness of Breath    Exertional    HPI: Brandy Stevenson is a 62 y.o. female with a PMH notable for significant PAD with Leriche syndrome, hypertension, hyperlipidemia, former heavy smoker with COPD, GERD breast cancer status post lumpectomy.  She presents today for 17-month follow-up to discuss coronary CT angiogram results after initially being seen in July and September.    Brandy Stevenson was last seen on November 03, 2017 as a follow-up from her July visit with Fabian Sharp, PA where she was seen for chest pain as well as preoperative clearance for possible aortobifem bypass surgery.  During that visit it became clear that the plan was to avoid aortobifem surgery for now.  She was having both pseudoclaudication and claudication symptoms.  No cardiac symptoms.  For coronary ischemic evaluation we ordered a coronary CTA.  Recent Hospitalizations: None since ER visit in June                           Studies Personally Reviewed - (if available, images/films reviewed: From Epic Chart or Care Everywhere)  Coronary CTA December 10, 2017: Coronary calcium score is 0.  No evidence of CAD.  Interval History: Brandy Stevenson returns here today to discuss results of her CT scan.  She tells me that she still has claudication when she is walking which does seem to be relatively limiting, but smokes in the calf and thighs but also in her hips.  She says it slowly improving with with walking, but still there. She denies any cardiac symptoms or chest tightness pressure with rest or exertion.  She does not have any PND or orthopnea.  No significant edema. No headache or blurred vision.  No rapid irregular heartbeats palpitations.  No syncope/near syncope or TIA/amaurosis fugax.  She tells me at home her blood pressures usually run in the 130/80 to 140/90 range.  She had not yet taken her  blood pressure medications Sah[place] this morning.  ROS: A comprehensive was performed. Review of Systems  Constitutional: Negative for malaise/fatigue (Just not a lot of energy).  HENT: Negative for congestion and nosebleeds.   Respiratory: Positive for shortness of breath (A little bit with exertion).   Gastrointestinal: Negative for blood in stool, heartburn and melena.  Genitourinary: Negative for hematuria.  Musculoskeletal: Positive for neck pain (Significant also arthritis pains).  Neurological: Positive for headaches.  Psychiatric/Behavioral: The patient is nervous/anxious and has insomnia (Has a hard time sleeping because of neck pain).   All other systems reviewed and are negative.   I have reviewed and (if needed) personally updated the patient's problem list, medications, allergies, past medical and surgical history, social and family history.   Past Medical History:  Diagnosis Date  . Arthritis    back problems to be evaluated by neurology next month  . Cancer ([place]) 2009   L, Breast, radiation and surgery.  [name] COPD (chronic obstructive pulmonary disease) ([place])   . Depression   . GERD (gastroesophageal reflux disease)   . Hyperlipemia   . Hypertension   . Leriche syndrome ([place])    Infrarenal aortic occlusion extending to bilateral internal iliac arteries.  External iliacs fill via collaterals from lumbar arteries to hypogastric/internal iliac arteries -minimal disease on the right side and no disease on the left side leg arteries  distally.  . Peripheral vascular disease ([place]) 2013    Past Surgical History:  Procedure Laterality Date  . APPENDECTOMY  1980  . BREAST LUMPECTOMY Left    s/p radiation therapy   . BREAST SURGERY    . COLONOSCOPY  07/2015  . COLONOSCOPY WITH PROPOFOL N/A 06/26/2015   Procedure: COLONOSCOPY WITH PROPOFOL;  Surgeon: [name], MD;  Location: [facility] ENDOSCOPY;  Service: Endoscopy;  Laterality: N/A;  . CT CTA CORONARY W/CA SCORE W/CM &/OR  WO/CM  12/10/2017   Coronary calcium score is 0.  No evidence of CAD.  [name] PERIPHERAL VASCULAR CATHETERIZATION N/A 10/22/2015   Procedure: Abdominal Aortogram w/Lower Extremity;  Surgeon: [name], MD;  Location: [facility] INVASIVE CV LAB: Aortic arch widely patent as far as innominate artery, right subclavian artery, right vertebral artery and right, common carotid.  Bilateral single renal arteries - Nonocclusive disease. Infrarenal aorta 100% - bilateral common Iliac 100% --> B Ex Iliacs fill via Int Iliac. Leg OK  . TRANSTHORACIC ECHOCARDIOGRAM  08/2017   EF 60-65%. No RWMA.  Normal DF.  Normal valves.   . TUBAL LIGATION  1991    Current Meds  Medication Sig  . albuterol (PROVENTIL HFA;VENTOLIN HFA) 108 (90 Base) MCG/ACT inhaler Inhale 2 puffs every 6 (six) hours as needed into the lungs for wheezing.  [name] amLODipine (NORVASC) 10 MG tablet Take 1 tablet (10 mg total) by mouth at bedtime.  [name] ammonium lactate (LAC-HYDRIN) 12 % cream Apply topically as needed for dry skin.  [name] aspirin EC 81 MG tablet Take 1 tablet (81 mg total) by mouth daily.  [name] atorvastatin (LIPITOR) 40 MG tablet Take 1 tablet (40 mg total) by mouth daily.  . carvedilol (COREG) 6.25 MG tablet Take 1 tablet (6.25 mg total) by mouth 2 (two) times daily.  . cyclobenzaprine (FLEXERIL) 5 MG tablet Take 1 tablet (5 mg total) by mouth 2 (two) times daily as needed for muscle spasms.  . Fluticasone-Salmeterol (ADVAIR) 100-50 MCG/DOSE AEPB Inhale 1 puff into the lungs 2 (two) times daily.  [name] gabapentin (NEURONTIN) 300 MG capsule Take 1 capsule (300 mg total) by mouth at bedtime.  [name] lisinopril (PRINIVIL,ZESTRIL) 40 MG tablet Take 1 tablet (40 mg total) by mouth daily.  [name] loperamide (IMODIUM A-D) 2 MG tablet Take 1 tablet (2 mg total) by mouth every 8 (eight) hours as needed for diarrhea or loose stools.  . meloxicam (MOBIC) 15 MG tablet Take 1 tablet (15 mg total) by mouth daily.  [name] omeprazole (PRILOSEC) 40 MG capsule Take 1 capsule  (40 mg total) by mouth daily.  [name] spironolactone (ALDACTONE) 25 MG tablet Take 1 tablet (25 mg total) by mouth daily.  . [DISCONTINUED] metoprolol tartrate (LOPRESSOR) 50 MG tablet Take 1 tablet (50 mg total) by mouth once for 1 dose. TAKE ONE HOUR PRIOR TO  SCHEDULE CARDAIC TEST    Allergies  Allergen Reactions  . Vicodin [Hydrocodone-Acetaminophen] Nausea And Vomiting and Other (See Comments)    Sweating  . Wellbutrin [Bupropion]     Made me feel bad    Social History   Tobacco Use  . Smoking status: Former Smoker    Packs/day: 0.25    Years: 40.00    Pack years: 10.00    Types: Cigarettes    Last attempt to quit: 02/08/2017    Years since quitting: 0.9  . Smokeless tobacco: Never Used  . Tobacco comment: 3 cigarettes per day  Substance Use Topics  . Alcohol use: No  .  Drug use: No   Social History   Social History Narrative  . Not on file    family history includes Heart attack in her brother; Heart disease in her brother, mother, and sister; Hyperlipidemia in her brother, mother, and sister; Hypertension in her brother, daughter, mother, and sister.  Wt Readings from Last 3 Encounters:  01/07/18 174 lb 9.6 oz (79.2 kg)  01/06/18 174 lb (78.9 kg)  11/03/17 174 lb (78.9 kg)    PHYSICAL EXAM BP (!) 151/93   Pulse 63   Ht 5\' 5"  (1.651 m)   Wt 174 lb (78.9 kg)   BMI 28.96 kg/m  Physical Exam  Constitutional: She is oriented to person, place, and time. She appears well-developed and well-nourished. No distress.  Well-groomed.  Healthy-appearing  HENT:  Head: Normocephalic and atraumatic.  Neck: Normal range of motion. Neck supple. No hepatojugular reflux and no JVD present. Carotid bruit is present (Soft bilateral carotid bruits).  Cardiovascular: Normal rate, regular rhythm and normal heart sounds.  No extrasystoles are present. PMI is not displaced. Exam reveals decreased pulses (Bilateral pedal pulses). Exam reveals no gallop and no friction rub.  No murmur  heard. Pulmonary/Chest: Effort normal and breath sounds normal. No respiratory distress. She has no wheezes. She exhibits tenderness (Some chest wall tenderness to palpation).  Abdominal: Soft. Bowel sounds are normal. She exhibits no distension. There is no tenderness.  Musculoskeletal: Normal range of motion. She exhibits no edema.  Neurological: She is alert and oriented to person, place, and time.  Psychiatric: She has a normal mood and affect. Her behavior is normal. Judgment and thought content normal.  Vitals reviewed.    Adult ECG Report Not checked  Other studies Reviewed: Additional studies/ records that were reviewed today include:  Recent Labs:   Lab Results  Component Value Date   CHOL 161 10/03/2016   HDL 51 10/03/2016   LDLCALC 84 10/03/2016   TRIG 130 10/03/2016   CHOLHDL 3.2 10/03/2016   Lab Results  Component Value Date   CREATININE 0.90 12/10/2017   BUN 18 10/27/2017   NA 146 (H) 10/27/2017   K CANCELED 10/27/2017   K 3.9 10/27/2017   CL 104 10/27/2017   CO2 22 10/27/2017   CBC Latest Ref Rng & Units 09/29/2017 08/25/2017 07/29/2017  WBC 4.0 - 10.5 K/uL - 6.6 5.7  Hemoglobin 12.0 - 15.0 g/dL 12.2 12.8 12.4  Hematocrit 36.0 - 46.0 % 36.0 39.5 38.2  Platelets 150 - 400 K/uL - 230 210   Lab Results  Component Value Date   HGBA1C 5.90 03/15/2015    ASSESSMENT / PLAN: Problem List Items Addressed This Visit    Accelerated hypertension - Primary (Chronic)    Blood pressure is up today compared to what she says it usually is.  She is on max dose of lisinopril and amlodipine.  There is room to increase carvedilol some but with resting heart rate of 63 not overly febrile.  Is on spironolactone which could be titrated up.      Aortoiliac occlusive disease (HCC) (Chronic)    Thankfully, despite having occluded distal aorta, coronary arteries are relatively stable. Leriche syndrome with claudication that is not getting better.  Will defer to vascular surgery,  but it appears that surgery is not being considered at this time.      Atypical chest pain (Chronic)    She had multiple different types of chest discomfort, but relatively reassuring findings on coronary CT angiogram.  Would suspect that  these are most related to musculoskeletal issues.  We just recommend Tylenol or Motrin.      Hyperlipidemia with target LDL less than 70 (Chronic)    Has not had labs checked since August of last year.  Would not be unreasonable to reassess.  LDL is 84.  With having significant PAD, would probably try to target below 70. Is on 40 mg of atorvastatin.    Was supposed of had labs checked soon.         I spent a total of 25 minutes with the patient and chart review. >  50% of the time was spent in direct patient consultation.   Current medicines are reviewed at length with the patient today.  (+/- concerns) none The following changes have been made:  none  Patient Instructions  Medication Instructions:  NOT NEEDED If you need a refill on your cardiac medications before your next appointment, please call your pharmacy.   Lab work: NOT NEEDED If you have labs (blood work) drawn today and your tests are completely normal, you will receive your results only by: [name] MyChart Message (if you have MyChart) OR . A paper copy in the mail If you have any lab test that is abnormal or we need to change your treatment, we will call you to review the results.  Testing/Procedures: NOT NEEDED  Follow-Up: At [facility], you and your health needs are our priority.  As part of our continuing mission to provide you with exceptional heart care, we have created designated Provider Care Teams.  These Care Teams include your primary Cardiologist (physician) and Advanced Practice Providers (APPs -  Physician Assistants and Nurse Practitioners) who all work together to provide you with the care you need, when you need it. You will need a follow up appointment in 12  months.  Please call our office 2 months in advance to schedule this appointment.  You may see Glenetta Hew, MD or one of the following Advanced Practice Providers on your designated Care Team:   Rosaria Ferries, PA-C . Jory Sims, DNP, ANP  Any Other Special Instructions Will Be Listed Below (If Applicable).  DID NOT TAKE MEDICATION THIS MORNING 01/06/18 PLEASE SHOW TO PRIMARY - IF PATIENT'S BLOOD PRESSURE IS STILL ELEVATED.  RECOMMEND INCREASING CARVEDILOL      Studies Ordered:   No orders of the defined types were placed in this encounter.     Glenetta Hew, M.D., M.S. Interventional Cardiologist   Pager # [phone] Phone # [phone] [address] [place], [place] [address]   Thank you for choosing Heartcare at [facility]!!

## 2018-01-06 NOTE — Patient Instructions (Addendum)
Medication Instructions:  NOT NEEDED If you need a refill on your cardiac medications before your next appointment, please call your pharmacy.   Lab work: NOT NEEDED If you have labs (blood work) drawn today and your tests are completely normal, you will receive your results only by: [name] MyChart Message (if you have MyChart) OR . A paper copy in the mail If you have any lab test that is abnormal or we need to change your treatment, we will call you to review the results.  Testing/Procedures: NOT NEEDED  Follow-Up: At [facility], you and your health needs are our priority.  As part of our continuing mission to provide you with exceptional heart care, we have created designated Provider Care Teams.  These Care Teams include your primary Cardiologist (physician) and Advanced Practice Providers (APPs -  Physician Assistants and Nurse Practitioners) who all work together to provide you with the care you need, when you need it. You will need a follow up appointment in 12 months.  Please call our office 2 months in advance to schedule this appointment.  You may see Glenetta Hew, MD or one of the following Advanced Practice Providers on your designated Care Team:   Rosaria Ferries, PA-C . Jory Sims, DNP, ANP  Any Other Special Instructions Will Be Listed Below (If Applicable).  DID NOT TAKE MEDICATION THIS MORNING 01/06/18 PLEASE SHOW TO PRIMARY - IF PATIENT'S BLOOD PRESSURE IS STILL ELEVATED.  RECOMMEND INCREASING CARVEDILOL

## 2018-01-07 ENCOUNTER — Ambulatory Visit: Payer: Medicare HMO | Attending: Internal Medicine | Admitting: Internal Medicine

## 2018-01-07 ENCOUNTER — Encounter: Payer: Self-pay | Admitting: Internal Medicine

## 2018-01-07 VITALS — BP 167/93 | HR 58 | Temp 98.3°F | Resp 16 | Wt 174.6 lb

## 2018-01-07 DIAGNOSIS — Z7982 Long term (current) use of aspirin: Secondary | ICD-10-CM | POA: Insufficient documentation

## 2018-01-07 DIAGNOSIS — F419 Anxiety disorder, unspecified: Secondary | ICD-10-CM

## 2018-01-07 DIAGNOSIS — Z2821 Immunization not carried out because of patient refusal: Secondary | ICD-10-CM

## 2018-01-07 DIAGNOSIS — K219 Gastro-esophageal reflux disease without esophagitis: Secondary | ICD-10-CM | POA: Insufficient documentation

## 2018-01-07 DIAGNOSIS — Z8349 Family history of other endocrine, nutritional and metabolic diseases: Secondary | ICD-10-CM | POA: Insufficient documentation

## 2018-01-07 DIAGNOSIS — I10 Essential (primary) hypertension: Secondary | ICD-10-CM | POA: Insufficient documentation

## 2018-01-07 DIAGNOSIS — F32A Depression, unspecified: Secondary | ICD-10-CM

## 2018-01-07 DIAGNOSIS — Z8249 Family history of ischemic heart disease and other diseases of the circulatory system: Secondary | ICD-10-CM | POA: Insufficient documentation

## 2018-01-07 DIAGNOSIS — H547 Unspecified visual loss: Secondary | ICD-10-CM

## 2018-01-07 DIAGNOSIS — E785 Hyperlipidemia, unspecified: Secondary | ICD-10-CM | POA: Diagnosis not present

## 2018-01-07 DIAGNOSIS — J449 Chronic obstructive pulmonary disease, unspecified: Secondary | ICD-10-CM | POA: Diagnosis not present

## 2018-01-07 DIAGNOSIS — H538 Other visual disturbances: Secondary | ICD-10-CM | POA: Diagnosis not present

## 2018-01-07 DIAGNOSIS — Z853 Personal history of malignant neoplasm of breast: Secondary | ICD-10-CM | POA: Diagnosis not present

## 2018-01-07 DIAGNOSIS — F329 Major depressive disorder, single episode, unspecified: Secondary | ICD-10-CM | POA: Diagnosis not present

## 2018-01-07 DIAGNOSIS — Z9889 Other specified postprocedural states: Secondary | ICD-10-CM | POA: Insufficient documentation

## 2018-01-07 DIAGNOSIS — Z885 Allergy status to narcotic agent status: Secondary | ICD-10-CM | POA: Insufficient documentation

## 2018-01-07 DIAGNOSIS — Z79899 Other long term (current) drug therapy: Secondary | ICD-10-CM | POA: Insufficient documentation

## 2018-01-07 DIAGNOSIS — Z1239 Encounter for other screening for malignant neoplasm of breast: Secondary | ICD-10-CM | POA: Diagnosis not present

## 2018-01-07 DIAGNOSIS — Z87891 Personal history of nicotine dependence: Secondary | ICD-10-CM | POA: Insufficient documentation

## 2018-01-07 DIAGNOSIS — Z923 Personal history of irradiation: Secondary | ICD-10-CM | POA: Diagnosis not present

## 2018-01-07 DIAGNOSIS — Z888 Allergy status to other drugs, medicaments and biological substances status: Secondary | ICD-10-CM | POA: Insufficient documentation

## 2018-01-07 NOTE — Patient Instructions (Signed)
Continue your home blood pressure monitoring.  Continue to limit salt in the foods.  You have been referred to ophthalmologist for eye exam.

## 2018-01-07 NOTE — Progress Notes (Signed)
Patient ID: Brandy Stevenson, female    DOB: 03/27/1955  MRN: [id number]  CC: Hypertension   Subjective: Brandy Stevenson is a 62 y.o. female who presents for chronic disease management. Her concerns today include:  Hx of PAD, HTN, HL, former tob dep, LT breast CA (lumpectomy and XRT 2009), GERD, COPD, Trigeminal neuralgia RT, dep/anxiety, OA shoulders  HTN:  Since I last saw her, Lisinopril was inc to 40 mg daily and Coreg added.  She has seen Dr. Ellyn Hack twice since I last saw her.  Coronary CT was good.  -checking BP every morning with wrist device.  This a.m her home BP reading was 134/90.  Other readings are 133/85, 143/91, 135/88, 130/76, 133/84  Dep screen positive and GAD score 4 points inc from last visit.  Pt reports she has a lot going on in her family but she tries to keep herself grounded by just doing what she can do and not worrying about the rest.  Mentally she feels she is doing okay.  She declines referral to a therapist.  She does not feel she needs to be on any medication at this time.  HM: declines flu shot.  Needs MMG Patient Active Problem List   Diagnosis Date Noted  . Aortoiliac occlusive disease ([place]) 11/04/2017  . DOE (dyspnea on exertion) 11/03/2017  . Atypical chest pain 11/03/2017  . Ichthyosis 10/06/2017  . Former smoker 04/07/2017  . Primary osteoarthritis of right shoulder 04/07/2017  . Chronic midline low back pain without sciatica 04/07/2017  . Hiatal hernia 05/01/2016  . DJD (degenerative joint disease) of cervical spine 04/17/2016  . History of breast cancer 02/18/2016  . Foot callus 02/18/2016  . Anxiety and depression 05/22/2015  . Insomnia 03/15/2015  . Unspecified vitamin D deficiency 04/08/2013  . Hyperlipidemia with target LDL less than 70 04/08/2013  . Atherosclerotic PVD with intermittent claudication ([place]) 03/22/2013  . Accelerated hypertension 02/23/2013  . GERD (gastroesophageal reflux disease) 02/23/2013  . COPD (chronic  obstructive pulmonary disease) ([place]) 02/23/2013     Current Outpatient Medications on File Prior to Visit  Medication Sig Dispense Refill  . albuterol (PROVENTIL HFA;VENTOLIN HFA) 108 (90 Base) MCG/ACT inhaler Inhale 2 puffs every 6 (six) hours as needed into the lungs for wheezing. 3 Inhaler 4  . amLODipine (NORVASC) 10 MG tablet Take 1 tablet (10 mg total) by mouth at bedtime. 180 tablet 3  . ammonium lactate (LAC-HYDRIN) 12 % cream Apply topically as needed for dry skin. 385 g 0  . aspirin EC 81 MG tablet Take 1 tablet (81 mg total) by mouth daily. 90 tablet 3  . atorvastatin (LIPITOR) 40 MG tablet Take 1 tablet (40 mg total) by mouth daily. 90 tablet 3  . carvedilol (COREG) 6.25 MG tablet Take 1 tablet (6.25 mg total) by mouth 2 (two) times daily. 180 tablet 3  . cyclobenzaprine (FLEXERIL) 5 MG tablet Take 1 tablet (5 mg total) by mouth 2 (two) times daily as needed for muscle spasms. 15 tablet 0  . Fluticasone-Salmeterol (ADVAIR) 100-50 MCG/DOSE AEPB Inhale 1 puff into the lungs 2 (two) times daily. 180 each 3  . gabapentin (NEURONTIN) 300 MG capsule Take 1 capsule (300 mg total) by mouth at bedtime. 90 capsule 1  . lisinopril (PRINIVIL,ZESTRIL) 40 MG tablet Take 1 tablet (40 mg total) by mouth daily. 90 tablet 0  . loperamide (IMODIUM A-D) 2 MG tablet Take 1 tablet (2 mg total) by mouth every 8 (eight) hours as needed for diarrhea  or loose stools. 12 tablet 0  . meloxicam (MOBIC) 15 MG tablet Take 1 tablet (15 mg total) by mouth daily. 30 tablet 6  . omeprazole (PRILOSEC) 40 MG capsule Take 1 capsule (40 mg total) by mouth daily. 30 capsule 5  . spironolactone (ALDACTONE) 25 MG tablet Take 1 tablet (25 mg total) by mouth daily. 90 tablet 0   No current facility-administered medications on file prior to visit.     Allergies  Allergen Reactions  . Vicodin [Hydrocodone-Acetaminophen] Nausea And Vomiting and Other (See Comments)    Sweating  . Wellbutrin [Bupropion]     Made me feel bad     Social History   Socioeconomic History  . Marital status: Married    Spouse name: Not on file  . Number of children: Not on file  . Years of education: Not on file  . Highest education level: Not on file  Occupational History  . Not on file  Social Needs  . Financial resource strain: Not on file  . Food insecurity:    Worry: Not on file    Inability: Not on file  . Transportation needs:    Medical: Not on file    Non-medical: Not on file  Tobacco Use  . Smoking status: Former Smoker    Packs/day: 0.25    Years: 40.00    Pack years: 10.00    Types: Cigarettes    Last attempt to quit: 02/08/2017    Years since quitting: 0.9  . Smokeless tobacco: Never Used  . Tobacco comment: 3 cigarettes per day  Substance and Sexual Activity  . Alcohol use: No  . Drug use: No  . Sexual activity: Not Currently  Lifestyle  . Physical activity:    Days per week: Not on file    Minutes per session: Not on file  . Stress: Not on file  Relationships  . Social connections:    Talks on phone: Not on file    Gets together: Not on file    Attends religious service: Not on file    Active member of club or organization: Not on file    Attends meetings of clubs or organizations: Not on file    Relationship status: Not on file  . Intimate partner violence:    Fear of current or ex partner: Not on file    Emotionally abused: Not on file    Physically abused: Not on file    Forced sexual activity: Not on file  Other Topics Concern  . Not on file  Social History Narrative  . Not on file    Family History  Problem Relation Age of Onset  . Hyperlipidemia Mother   . Hypertension Mother   . Heart disease Mother        Atrial Fib.  . Hyperlipidemia Sister   . Hypertension Sister   . Heart disease Sister        Before age 10,  CHF  . Heart attack Brother   . Hyperlipidemia Brother   . Hypertension Brother   . Heart disease Brother   . Hypertension Daughter     Past Surgical  History:  Procedure Laterality Date  . APPENDECTOMY  1980  . BREAST LUMPECTOMY Left    s/p radiation therapy   . BREAST SURGERY    . COLONOSCOPY  07/2015  . COLONOSCOPY WITH PROPOFOL N/A 06/26/2015   Procedure: COLONOSCOPY WITH PROPOFOL;  Surgeon: [name], MD;  Location: [facility];  Service: Endoscopy;  Laterality: N/A;  . PERIPHERAL VASCULAR CATHETERIZATION N/A 10/22/2015   Procedure: Abdominal Aortogram w/Lower Extremity;  Surgeon: [name], MD;  Location: [facility] INVASIVE CV LAB: Aortic arch widely patent as far as innominate artery, right subclavian artery, right vertebral artery and right, common carotid.  Bilateral single renal arteries - Nonocclusive disease. Infrarenal aorta 100% - bilateral common Iliac 100% --> B Ex Iliacs fill via Int Iliac. Leg OK  . TRANSTHORACIC ECHOCARDIOGRAM  08/2017   EF 60-65%. No RWMA.  Normal DF.  Normal valves.   . TUBAL LIGATION  1991    ROS: Review of Systems  Eyes:       Has problems with distant vision.  Wears readers   Musculoskeletal:       Takes Acetaminophen 500 mg 3-4 times a day for jt aches and sometimes HA.  Helps. She also takes Mobic 15 mg daily.      PHYSICAL EXAM: BP (!) 167/93   Pulse (!) 58   Temp 98.3 F (36.8 C) (Oral)   Resp 16   Wt 174 lb 9.6 oz (79.2 kg)   SpO2 100%   BMI 29.05 kg/m   BP 165/96.  Pt's device reading was 160/93 Physical Exam  General appearance - alert, well appearing, and in no distress Mental status - normal mood, behavior, speech, dress, motor activity, and thought processes Neck - supple, no significant adenopathy Chest - clear to auscultation, no wheezes, rales or rhonchi, symmetric air entry Heart - normal rate, regular rhythm, normal S1, S2, no murmurs, rubs, clicks or gallops Extremities - peripheral pulses normal, no pedal edema, no clubbing or cyanosis  Depression screen [facility] 2/9 01/07/2018 10/06/2017 07/06/2017  Decreased Interest 2 1 1   Down, Depressed, Hopeless 1 1 1     PHQ - 2 Score 3 2 2   Altered sleeping 3 2 3   Tired, decreased energy 2 2 3   Change in appetite 2 2 0  Feeling bad or failure about yourself  0 0 1  Trouble concentrating 1 1 1   Moving slowly or fidgety/restless 1 1 0  Suicidal thoughts 0 0 0  PHQ-9 Score 12 10 10   Some recent data might be hidden   GAD 7 : Generalized Anxiety Score 01/07/2018 10/06/2017 07/06/2017 04/07/2017  Nervous, Anxious, on Edge 2 1 2 2   Control/stop worrying 3 2 3 3   Worry too much - different things 3 2 3 3   Trouble relaxing 2 2 3 2   Restless 2 1 3 2   Easily annoyed or irritable 1 1 2 2   Afraid - awful might happen 1 1 1 1   Total GAD 7 Score 14 10 17 15   Anxiety Difficulty - - - -     ASSESSMENT AND PLAN:  1. Essential hypertension Not at goal based on office readings.  However blood pressure readings at home are better even though not at goal of 130/80 or lower.  Because the reading on her device today pretty much matched up with my manual check, I think her home device is accurate.  We have made no changes to her current blood pressure medications.  Increasing carvedilol will result into low pulse rate.  2. Anxiety and depression Patient feels she is doing okay mentally.  Declines referral to behavioral health  3. Breast cancer screening - MM Digital Screening; Future  4. Influenza vaccination declined  5. Poor vision - Ambulatory referral to Ophthalmology   Patient was given the opportunity to ask questions.  Patient verbalized understanding of the  plan and was able to repeat key elements of the plan.   Orders Placed This Encounter  Procedures  . MM Digital Screening  . Ambulatory referral to Ophthalmology     Requested Prescriptions    No prescriptions requested or ordered in this encounter    Return in about 2 months (around 03/09/2018).  Karle Plumber, MD, FACP

## 2018-01-08 ENCOUNTER — Encounter: Payer: Self-pay | Admitting: Cardiology

## 2018-01-08 NOTE — Assessment & Plan Note (Signed)
Has not had labs checked since August of last year.  Would not be unreasonable to reassess.  LDL is 84.  With having significant PAD, would probably try to target below 70. Is on 40 mg of atorvastatin.    Was supposed of had labs checked soon.

## 2018-01-08 NOTE — Assessment & Plan Note (Signed)
Thankfully, despite having occluded distal aorta, coronary arteries are relatively stable. Leriche syndrome with claudication that is not getting better.  Will defer to vascular surgery, but it appears that surgery is not being considered at this time.

## 2018-01-08 NOTE — Assessment & Plan Note (Signed)
Blood pressure is up today compared to what she says it usually is.  She is on max dose of lisinopril and amlodipine.  There is room to increase carvedilol some but with resting heart rate of 63 not overly febrile.  Is on spironolactone which could be titrated up.

## 2018-01-08 NOTE — Assessment & Plan Note (Signed)
She had multiple different types of chest discomfort, but relatively reassuring findings on coronary CT angiogram.  Would suspect that these are most related to musculoskeletal issues.  We just recommend Tylenol or Motrin.

## 2018-01-18 ENCOUNTER — Ambulatory Visit (INDEPENDENT_AMBULATORY_CARE_PROVIDER_SITE_OTHER): Payer: Medicare HMO | Admitting: Family Medicine

## 2018-01-18 ENCOUNTER — Encounter: Payer: Self-pay | Admitting: Family Medicine

## 2018-01-18 ENCOUNTER — Telehealth: Payer: Self-pay | Admitting: Internal Medicine

## 2018-01-18 ENCOUNTER — Other Ambulatory Visit: Payer: Self-pay | Admitting: Family Medicine

## 2018-01-18 VITALS — BP 165/84 | HR 56 | Resp 17 | Ht 65.0 in | Wt 172.8 lb

## 2018-01-18 DIAGNOSIS — Z760 Encounter for issue of repeat prescription: Secondary | ICD-10-CM

## 2018-01-18 DIAGNOSIS — R319 Hematuria, unspecified: Secondary | ICD-10-CM | POA: Diagnosis not present

## 2018-01-18 DIAGNOSIS — I10 Essential (primary) hypertension: Secondary | ICD-10-CM | POA: Diagnosis not present

## 2018-01-18 LAB — POCT URINALYSIS DIP (CLINITEK)
Bilirubin, UA: NEGATIVE
Glucose, UA: NEGATIVE mg/dL
Ketones, POC UA: NEGATIVE mg/dL
Nitrite, UA: NEGATIVE
POC PROTEIN,UA: NEGATIVE
Spec Grav, UA: 1.015 (ref 1.010–1.025)
Urobilinogen, UA: 0.2 E.U./dL
pH, UA: 8.5 — AB (ref 5.0–8.0)

## 2018-01-18 MED ORDER — CEPHALEXIN 500 MG PO CAPS: 500.0000 mg | ORAL_CAPSULE | Freq: Two times a day (BID) | ORAL | 0 refills | Status: AC

## 2018-01-18 MED ORDER — OMEPRAZOLE 40 MG PO CPDR: 40.0000 mg | DELAYED_RELEASE_CAPSULE | Freq: Every day | ORAL | 5 refills | Status: DC

## 2018-01-18 MED ORDER — MELOXICAM 15 MG PO TABS: 15.0000 mg | ORAL_TABLET | Freq: Every day | ORAL | 6 refills | Status: DC

## 2018-01-18 NOTE — Progress Notes (Signed)
Patient ID: Brandy Stevenson, female    DOB: 09-20-1955, 62 y.o.   MRN: [id number]  PCP: [name], MD  Chief Complaint  Patient presents with  . Hematuria    Subjective:  HPI  Brandy Stevenson is a 62 y.o. female presents for evaluation of acute onset hematuria. This is a new problem. Reports urinating over approximately five times today with visible blood present in 3/5 of urine occurrences. Denies dysuria, urgency, or flank pain. She admits to over 1 week ago she engage in sexual activity with her husband. This is not a routine occurrence and she attributes this activity to possibility of infection. She denies dyspareunia with recent sexual activity.    Accelerated Hypertension  Currently receiving frequent BP monitoring by PCP over the last month. Patient has BP readings from home which are also mixed with reading less than 140/80 and a few greater than 140/80. Current BP while in office today 165/84.  Denies chest pain, palpations, shortness of breath, weakness, or headaches. Social History   Socioeconomic History  . Marital status: Married    Spouse name: Not on file  . Number of children: Not on file  . Years of education: Not on file  . Highest education level: Not on file  Occupational History  . Not on file  Social Needs  . Financial resource strain: Not on file  . Food insecurity:    Worry: Not on file    Inability: Not on file  . Transportation needs:    Medical: Not on file    Non-medical: Not on file  Tobacco Use  . Smoking status: Former Smoker    Packs/day: 0.25    Years: 40.00    Pack years: 10.00    Types: Cigarettes    Last attempt to quit: 02/08/2017    Years since quitting: 0.9  . Smokeless tobacco: Never Used  . Tobacco comment: 3 cigarettes per day  Substance and Sexual Activity  . Alcohol use: No  . Drug use: No  . Sexual activity: Not Currently  Lifestyle  . Physical activity:    Days per week: Not on file    Minutes per session: Not  on file  . Stress: Not on file  Relationships  . Social connections:    Talks on phone: Not on file    Gets together: Not on file    Attends religious service: Not on file    Active member of club or organization: Not on file    Attends meetings of clubs or organizations: Not on file    Relationship status: Not on file  . Intimate partner violence:    Fear of current or ex partner: Not on file    Emotionally abused: Not on file    Physically abused: Not on file    Forced sexual activity: Not on file  Other Topics Concern  . Not on file  Social History Narrative  . Not on file    Family History  Problem Relation Age of Onset  . Hyperlipidemia Mother   . Hypertension Mother   . Heart disease Mother        Atrial Fib.  . Hyperlipidemia Sister   . Hypertension Sister   . Heart disease Sister        Before age 31,  CHF  . Heart attack Brother   . Hyperlipidemia Brother   . Hypertension Brother   . Heart disease Brother   . Hypertension Daughter    Review of Systems  Pertinent negatives listed in HPI Patient Active Problem List   Diagnosis Date Noted  . Aortoiliac occlusive disease ([place]) 11/04/2017  . DOE (dyspnea on exertion) 11/03/2017  . Atypical chest pain 11/03/2017  . Ichthyosis 10/06/2017  . Former smoker 04/07/2017  . Primary osteoarthritis of right shoulder 04/07/2017  . Chronic midline low back pain without sciatica 04/07/2017  . Hiatal hernia 05/01/2016  . DJD (degenerative joint disease) of cervical spine 04/17/2016  . History of breast cancer 02/18/2016  . Foot callus 02/18/2016  . Anxiety and depression 05/22/2015  . Insomnia 03/15/2015  . Unspecified vitamin D deficiency 04/08/2013  . Hyperlipidemia with target LDL less than 70 04/08/2013  . Atherosclerotic PVD with intermittent claudication ([place]) 03/22/2013  . Accelerated hypertension 02/23/2013  . GERD (gastroesophageal reflux disease) 02/23/2013  . COPD (chronic obstructive pulmonary disease)  ([place]) 02/23/2013    Allergies  Allergen Reactions  . Vicodin [Hydrocodone-Acetaminophen] Nausea And Vomiting and Other (See Comments)    Sweating  . Wellbutrin [Bupropion]     Made me feel bad    Prior to Admission medications   Medication Sig Start Date End Date Taking? Authorizing Provider  albuterol (PROVENTIL HFA;VENTOLIN HFA) 108 (90 Base) MCG/ACT inhaler Inhale 2 puffs every 6 (six) hours as needed into the lungs for wheezing. 01/09/17  Yes [name], MD  amLODipine (NORVASC) 10 MG tablet Take 1 tablet (10 mg total) by mouth at bedtime. 11/03/17 02/01/18 Yes [name], MD  ammonium lactate (LAC-HYDRIN) 12 % cream Apply topically as needed for dry skin. 10/06/17  Yes [name], MD  aspirin EC 81 MG tablet Take 1 tablet (81 mg total) by mouth daily. 10/03/16  Yes Funches, Josalyn, MD  atorvastatin (LIPITOR) 40 MG tablet Take 1 tablet (40 mg total) by mouth daily. 11/03/17 02/01/18 Yes [name], MD  carvedilol (COREG) 6.25 MG tablet Take 1 tablet (6.25 mg total) by mouth 2 (two) times daily. 11/03/17  Yes [name], MD  cyclobenzaprine (FLEXERIL) 5 MG tablet Take 1 tablet (5 mg total) by mouth 2 (two) times daily as needed for muscle spasms. 12/03/17  Yes [name], MD  Fluticasone-Salmeterol (ADVAIR) 100-50 MCG/DOSE AEPB Inhale 1 puff into the lungs 2 (two) times daily. 10/03/16  Yes Funches, Josalyn, MD  gabapentin (NEURONTIN) 300 MG capsule Take 1 capsule (300 mg total) by mouth at bedtime. 12/09/17  Yes [name], MD  lisinopril (PRINIVIL,ZESTRIL) 40 MG tablet Take 1 tablet (40 mg total) by mouth daily. 12/02/17  Yes [name], MD  loperamide (IMODIUM A-D) 2 MG tablet Take 1 tablet (2 mg total) by mouth every 8 (eight) hours as needed for diarrhea or loose stools. 03/13/17  Yes [name], MD  meloxicam (MOBIC) 15 MG tablet Take 1 tablet (15 mg total) by mouth daily. 01/18/18  Yes Scot Jun, FNP  omeprazole (PRILOSEC) 40  MG capsule Take 1 capsule (40 mg total) by mouth daily. 01/18/18  Yes Scot Jun, FNP  spironolactone (ALDACTONE) 25 MG tablet Take 1 tablet (25 mg total) by mouth daily. 12/02/17  Yes [name], MD  cephALEXin (KEFLEX) 500 MG capsule Take 1 capsule (500 mg total) by mouth 2 (two) times daily for 7 days. 01/18/18 01/25/18  Scot Jun, FNP    Past Medical, Surgical Family and Social History reviewed and updated.    Objective:   Today's Vitals   01/18/18 1525  BP: (!) 165/84  Pulse: (!) 56  Resp: 17  SpO2: 96%  Weight: 172 lb 12.8 oz (78.4 kg)  Height: 5\' 5"  (1.651 m)    Wt Readings from Last 3 Encounters:  01/18/18 172 lb 12.8 oz (78.4 kg)  01/07/18 174 lb 9.6 oz (79.2 kg)  01/06/18 174 lb (78.9 kg)     Physical Exam General appearance: alert, well developed, well nourished, cooperative and in no distress Head: Normocephalic, without obvious abnormality, atraumatic Respiratory: Respirations even and unlabored, normal respiratory rate Heart: rate and rhythm normal. No gallop or murmurs noted on exam  Abdomen: negative LL abdominal tenderness. Negative for CVA tenderness.  Extremities: No gross deformities Skin: Skin color, texture, turgor normal. No rashes seen  Psych: Appropriate mood and affect. Neurologic: Mental status: Alert, oriented to person, place, and time, thought content appropriate.    Assessment & Plan:  1. Hematuria, unspecified type, blood and leukocytes noted on urine dipstick. Will treat empirically for UTI. Urine culture pending. If culture is negative   - POCT URINALYSIS DIP (CLINITEK) - Urine Culture; Future  2. Medication refill - omeprazole (PRILOSEC) 40 MG capsule; Take 1 capsule (40 mg total) by mouth daily.  Dispense: 30 capsule; Refill: 5 - meloxicam (MOBIC) 15 MG tablet; Take 1 tablet (15 mg total) by mouth daily.  Dispense: 30 tablet; Refill: 6  3. Accelerated hypertension, deferring management to PCP. Patient is  scheduled for a follow-up in 2 days with PCP. She is asymptomatic and has not taken evening BP medications Advised to monitor at home and if remains persisntley elevated or if symptoms occur,  follow-up at ER for further evaluation.    -The patient was given clear instructions to go to ER or return to medical center if symptoms do not improve, worsen or new problems develop. The patient verbalized understanding.    Molli Barrows, FNP Primary Care at [facility] [address] [place] [phone]: [phone]

## 2018-01-18 NOTE — Progress Notes (Unsigned)
UA machine is not available at site today. Please collect at [facility] and I will evaluate patient here in office.

## 2018-01-18 NOTE — Progress Notes (Unsigned)
Patient was seen here at [facility]. We received our UA machine moments before patient arrived therefore she has been taken care of. Thanks for following up with me.

## 2018-01-18 NOTE — Progress Notes (Signed)
It does not look as if your patient reported to [facility] prior to checking into her visit with you.

## 2018-01-18 NOTE — Patient Instructions (Signed)
Keep follow-up with Dr. Wynetta Emery on Thursday for evaluation of persistently elevated blood pressure. No medication changes made today for blood pressure.  Start Keflex 500 mg twice daily x 7 days.    Urinary Tract Infection, Adult A urinary tract infection (UTI) is an infection of any part of the urinary tract. The urinary tract includes the:  Kidneys.  Ureters.  Bladder.  Urethra.  These organs make, store, and get rid of pee (urine) in the body. Follow these instructions at home:  Take over-the-counter and prescription medicines only as told by your doctor.  If you were prescribed an antibiotic medicine, take it as told by your doctor. Do not stop taking the antibiotic even if you start to feel better.  Avoid the following drinks: ? Alcohol. ? Caffeine. ? Tea. ? Carbonated drinks.  Drink enough fluid to keep your pee clear or pale yellow.  Keep all follow-up visits as told by your doctor. This is important.  Make sure to: ? Empty your bladder often and completely. Do not to hold pee for long periods of time. ? Empty your bladder before and after sex. ? Wipe from front to back after a bowel movement if you are female. Use each tissue one time when you wipe. Contact a doctor if:  You have back pain.  You have a fever.  You feel sick to your stomach (nauseous).  You throw up (vomit).  Your symptoms do not get better after 3 days.  Your symptoms go away and then come back. Get help right away if:  You have very bad back pain.  You have very bad lower belly (abdominal) pain.  You are throwing up and cannot keep down any medicines or water. This information is not intended to replace advice given to you by your health care provider. Make sure you discuss any questions you have with your health care provider. Document Released: 08/06/2007 Document Revised: 07/26/2015 Document Reviewed: 01/08/2015 Elsevier Interactive Patient Education  [employer].

## 2018-01-18 NOTE — Telephone Encounter (Signed)
She is going to see Maudie Mercury at [facility] at 3:10 today

## 2018-01-18 NOTE — Telephone Encounter (Signed)
Patient called stating that there is blood in her Terrence Dupont and she wants to know what she should do I made her an appointment for thur at 9:30. She wants to know should she wait or go to urgent care.

## 2018-01-19 MED FILL — OMEPRAZOLE DR 40 MG CAPSULE: 40 | 30 days supply | Qty: 30 | Fill #0

## 2018-01-19 MED FILL — MELOXICAM 15 MG TABLET: 15 | 30 days supply | Qty: 30 | Fill #0

## 2018-01-20 ENCOUNTER — Telehealth: Payer: Self-pay | Admitting: Family Medicine

## 2018-01-20 LAB — URINE CULTURE

## 2018-01-20 NOTE — Telephone Encounter (Signed)
Contacted pt and spoke with pt husband advise to continue antibiotics

## 2018-01-20 NOTE — Telephone Encounter (Signed)
Urine culture confirms UTI. Patient should continue oral antibiotic therapy. Please notify Dr. Durenda Age patient.  Thanks,  Molli Barrows, FNP Primary Care at [facility] [address], [place] [phone]: [phone]

## 2018-01-21 ENCOUNTER — Ambulatory Visit: Payer: Medicare HMO | Admitting: Pharmacist

## 2018-02-01 MED FILL — ATORVASTATIN CALCIUM 40 MG: 40 | 90 days supply | Qty: 90 | Fill #1

## 2018-02-01 MED FILL — CARVEDILOL 6.25 MG TABLET: 6.25 | 90 days supply | Qty: 180 | Fill #1

## 2018-02-01 MED FILL — AMLODIPINE BESYLATE 10 MG T: 10 | 90 days supply | Qty: 90 | Fill #1

## 2018-02-26 ENCOUNTER — Other Ambulatory Visit: Payer: Self-pay | Admitting: Internal Medicine

## 2018-02-26 DIAGNOSIS — M545 Low back pain, unspecified: Secondary | ICD-10-CM

## 2018-02-26 DIAGNOSIS — I10 Essential (primary) hypertension: Secondary | ICD-10-CM

## 2018-02-26 DIAGNOSIS — G8929 Other chronic pain: Secondary | ICD-10-CM

## 2018-02-26 DIAGNOSIS — J418 Mixed simple and mucopurulent chronic bronchitis: Secondary | ICD-10-CM

## 2018-03-01 MED FILL — OMEPRAZOLE DR 40 MG CAPSULE: 40 | 30 days supply | Qty: 30 | Fill #1

## 2018-03-01 MED FILL — MELOXICAM 15 MG TABLET: 15 | 30 days supply | Qty: 30 | Fill #1

## 2018-03-05 ENCOUNTER — Other Ambulatory Visit: Payer: Self-pay | Admitting: *Deleted

## 2018-03-05 DIAGNOSIS — I739 Peripheral vascular disease, unspecified: Secondary | ICD-10-CM

## 2018-03-09 ENCOUNTER — Ambulatory Visit: Payer: Medicare HMO | Admitting: Internal Medicine

## 2018-03-16 ENCOUNTER — Encounter: Payer: Self-pay | Admitting: Internal Medicine

## 2018-03-16 ENCOUNTER — Ambulatory Visit: Payer: Medicare HMO | Attending: Internal Medicine | Admitting: Internal Medicine

## 2018-03-16 VITALS — BP 139/88 | HR 55 | Temp 98.4°F | Resp 16 | Ht 64.0 in | Wt 179.0 lb

## 2018-03-16 DIAGNOSIS — M19011 Primary osteoarthritis, right shoulder: Secondary | ICD-10-CM | POA: Diagnosis not present

## 2018-03-16 DIAGNOSIS — I739 Peripheral vascular disease, unspecified: Secondary | ICD-10-CM

## 2018-03-16 DIAGNOSIS — L84 Corns and callosities: Secondary | ICD-10-CM | POA: Diagnosis not present

## 2018-03-16 DIAGNOSIS — E210 Primary hyperparathyroidism: Secondary | ICD-10-CM | POA: Diagnosis not present

## 2018-03-16 DIAGNOSIS — J439 Emphysema, unspecified: Secondary | ICD-10-CM

## 2018-03-16 DIAGNOSIS — I10 Essential (primary) hypertension: Secondary | ICD-10-CM

## 2018-03-16 DIAGNOSIS — L98499 Non-pressure chronic ulcer of skin of other sites with unspecified severity: Secondary | ICD-10-CM

## 2018-03-16 MED ORDER — LISINOPRIL 40 MG PO TABS: 40.0000 mg | ORAL_TABLET | Freq: Every day | ORAL | 3 refills | Status: DC

## 2018-03-16 MED ORDER — UMECLIDINIUM BROMIDE 62.5 MCG/INH IN AEPB: 1.0000 | INHALATION_SPRAY | Freq: Every day | RESPIRATORY_TRACT | 6 refills | Status: DC

## 2018-03-16 MED ORDER — SPIRONOLACTONE 25 MG PO TABS: 25.0000 mg | ORAL_TABLET | Freq: Every day | ORAL | 3 refills | Status: DC

## 2018-03-16 MED FILL — INCRUSE ELLIPTA 62.5 MCG IN: 62.5 | 30 days supply | Qty: 30 | Fill #0

## 2018-03-16 MED FILL — SPIRONOLACTONE 25 MG TABLET: 25 | 90 days supply | Qty: 90 | Fill #0

## 2018-03-16 NOTE — Progress Notes (Signed)
Patient ID: Brandy Stevenson, female    DOB: [birth year]  MRN: [id number]  CC: No chief complaint on file.   Subjective: Brandy Stevenson is a 63 y.o. female who presents for chronic disease management. Her concerns today include:  Hx of PAD, HTN, HL, former tob dep, LT breast CA (lumpectomy and XRT 2009), GERD, COPD, Trigeminal neuralgia RT, dep/anxiety, OA shoulders  HYPERTENSION Currently taking: see medication list Med Adherence: [x]  Yes    []  No Medication side effects: []  Yes    [x]  No Adherence with salt restriction: [x]  Yes    []  No Home Monitoring?: [x]  Yes    []  No Monitoring Frequency: 1-2 x a day.  She has log with her today Home BP results range:   SBP 116-120s/ DBP 69-84 SOB? []  Yes    [x]  No Chest Pain?: []  Yes    [x]  No Leg swelling?: [x]  Yes, "somewhat"    []  No Headaches?: [x]  Yes, only if BP is high   []  No Dizziness? []  Yes    [x]  No Comments:   Dry skin:  Doing well with Lac-Hydrin lotion  OA shoulders: taking Tylenol 500 mg TID and Mobic.  Reports she is doing okay on these medications.  COPD:  "doing pretty good." Not on O2 Using Advir BID and Albuterol 2-3 x a day.  "Sometimes in the mid day, I feel I can 1/2 breath so I use the albuterol inhaler more." Thinks she has thrush again from Albuterol. Still free of tob  PAD:  Has appt with vasculur surgeon next mth Walking more so "the legs have not been bothering me as bad."  Can walk about 1/2 block before she has to stop due to cramps in the thigh. Compliant with Lipitor and ASA  Patient Active Problem List   Diagnosis Date Noted  . Callous ulcer ([place]) 03/16/2018  . Aortoiliac occlusive disease ([place]) 11/04/2017  . DOE (dyspnea on exertion) 11/03/2017  . Atypical chest pain 11/03/2017  . Ichthyosis 10/06/2017  . Former smoker 04/07/2017  . Primary osteoarthritis of right shoulder 04/07/2017  . Chronic midline low back pain without sciatica 04/07/2017  . Hiatal hernia 05/01/2016  . DJD  (degenerative joint disease) of cervical spine 04/17/2016  . History of breast cancer 02/18/2016  . Foot callus 02/18/2016  . Anxiety and depression 05/22/2015  . Insomnia 03/15/2015  . Unspecified vitamin D deficiency 04/08/2013  . Hyperlipidemia with target LDL less than 70 04/08/2013  . Atherosclerotic PVD with intermittent claudication ([place]) 03/22/2013  . Accelerated hypertension 02/23/2013  . GERD (gastroesophageal reflux disease) 02/23/2013  . COPD (chronic obstructive pulmonary disease) ([place]) 02/23/2013     Current Outpatient Medications on File Prior to Visit  Medication Sig Dispense Refill  . albuterol (PROVENTIL HFA;VENTOLIN HFA) 108 (90 Base) MCG/ACT inhaler INHALE 2 PUFFS EVERY 6 (SIX) HOURS AS NEEDED FOR WHEEZING. 54 g 0  . amLODipine (NORVASC) 10 MG tablet Take 1 tablet (10 mg total) by mouth at bedtime. 180 tablet 3  . ammonium lactate (LAC-HYDRIN) 12 % cream Apply topically as needed for dry skin. 385 g 0  . aspirin EC 81 MG tablet Take 1 tablet (81 mg total) by mouth daily. 90 tablet 3  . atorvastatin (LIPITOR) 40 MG tablet Take 1 tablet (40 mg total) by mouth daily. 90 tablet 3  . carvedilol (COREG) 6.25 MG tablet Take 1 tablet (6.25 mg total) by mouth 2 (two) times daily. 180 tablet 3  . cyclobenzaprine (FLEXERIL) 5 MG tablet TAKE  1 TABLET TWICE DAILY AS NEEDED FOR MUSCLE SPASM(S) 30 tablet 0  . Fluticasone-Salmeterol (ADVAIR) 100-50 MCG/DOSE AEPB Inhale 1 puff into the lungs 2 (two) times daily. 180 each 3  . gabapentin (NEURONTIN) 300 MG capsule Take 1 capsule (300 mg total) by mouth at bedtime. 90 capsule 1  . loperamide (IMODIUM A-D) 2 MG tablet Take 1 tablet (2 mg total) by mouth every 8 (eight) hours as needed for diarrhea or loose stools. 12 tablet 0  . meloxicam (MOBIC) 15 MG tablet Take 1 tablet (15 mg total) by mouth daily. 30 tablet 6  . omeprazole (PRILOSEC) 40 MG capsule Take 1 capsule (40 mg total) by mouth daily. 30 capsule 5   No current  facility-administered medications on file prior to visit.     Allergies  Allergen Reactions  . Vicodin [Hydrocodone-Acetaminophen] Nausea And Vomiting and Other (See Comments)    Sweating  . Wellbutrin [Bupropion]     Made me feel bad    Social History   Socioeconomic History  . Marital status: Married    Spouse name: Not on file  . Number of children: Not on file  . Years of education: Not on file  . Highest education level: Not on file  Occupational History  . Not on file  Social Needs  . Financial resource strain: Not on file  . Food insecurity:    Worry: Not on file    Inability: Not on file  . Transportation needs:    Medical: Not on file    Non-medical: Not on file  Tobacco Use  . Smoking status: Former Smoker    Packs/day: 0.25    Years: 40.00    Pack years: 10.00    Types: Cigarettes    Last attempt to quit: 02/08/2017    Years since quitting: 1.0  . Smokeless tobacco: Never Used  . Tobacco comment: 3 cigarettes per day  Substance and Sexual Activity  . Alcohol use: No  . Drug use: No  . Sexual activity: Not Currently  Lifestyle  . Physical activity:    Days per week: Not on file    Minutes per session: Not on file  . Stress: Not on file  Relationships  . Social connections:    Talks on phone: Not on file    Gets together: Not on file    Attends religious service: Not on file    Active member of club or organization: Not on file    Attends meetings of clubs or organizations: Not on file    Relationship status: Not on file  . Intimate partner violence:    Fear of current or ex partner: Not on file    Emotionally abused: Not on file    Physically abused: Not on file    Forced sexual activity: Not on file  Other Topics Concern  . Not on file  Social History Narrative  . Not on file    Family History  Problem Relation Age of Onset  . Hyperlipidemia Mother   . Hypertension Mother   . Heart disease Mother        Atrial Fib.  . Hyperlipidemia  Sister   . Hypertension Sister   . Heart disease Sister        Before age 70,  CHF  . Heart attack Brother   . Hyperlipidemia Brother   . Hypertension Brother   . Heart disease Brother   . Hypertension Daughter     Past Surgical History:  Procedure  Laterality Date  . APPENDECTOMY  1980  . BREAST LUMPECTOMY Left    s/p radiation therapy   . BREAST SURGERY    . COLONOSCOPY  07/2015  . COLONOSCOPY WITH PROPOFOL N/A 06/26/2015   Procedure: COLONOSCOPY WITH PROPOFOL;  Surgeon: [name], MD;  Location: [facility];  Service: Endoscopy;  Laterality: N/A;  . CT CTA CORONARY W/CA SCORE W/CM &/OR WO/CM  12/10/2017   Coronary calcium score is 0.  No evidence of CAD.  [name] PERIPHERAL VASCULAR CATHETERIZATION N/A 10/22/2015   Procedure: Abdominal Aortogram w/Lower Extremity;  Surgeon: [name], MD;  Location: [facility] INVASIVE CV LAB: Aortic arch widely patent as far as innominate artery, right subclavian artery, right vertebral artery and right, common carotid.  Bilateral single renal arteries - Nonocclusive disease. Infrarenal aorta 100% - bilateral common Iliac 100% --> B Ex Iliacs fill via Int Iliac. Leg OK  . TRANSTHORACIC ECHOCARDIOGRAM  08/2017   EF 60-65%. No RWMA.  Normal DF.  Normal valves.   . TUBAL LIGATION  1991    ROS: Review of Systems Skin: Callus on the sole of the right foot.  She has seen the podiatrist in the past and had them shaved down.  She states that it caused her about $40 to be seen so she now has an egg shaving device that she sees herself to keep them shaved down.    PHYSICAL EXAM: BP 139/88   Pulse (!) 55   Temp 98.4 F (36.9 C) (Oral)   Resp 16   Ht 5\' 4"  (1.626 m)   Wt 179 lb (81.2 kg)   SpO2 100%   BMI 30.73 kg/m   Wt Readings from Last 3 Encounters:  03/16/18 179 lb (81.2 kg)  01/18/18 172 lb 12.8 oz (78.4 kg)  01/07/18 174 lb 9.6 oz (79.2 kg)   Physical Exam General appearance - alert, well appearing, and in no distress Mental  status - normal mood, behavior, speech, dress, motor activity, and thought processes Nose - normal and patent, no erythema, discharge or polyps Mouth - mucous membranes moist, pharynx normal without lesions Neck - supple, no significant adenopathy Chest - clear to auscultation, no wheezes, rales or rhonchi, symmetric air entry Heart - normal rate, regular rhythm, normal S1, S2, no murmurs, rubs, clicks or gallops Extremities -no lower extremity edema Skin -toenails are thick and overgrown.  She has two 1 cm hyperpigmented callus on the mid sole of the right foot  ASSESSMENT AND PLAN:  1. Essential hypertension Not at goal today but reported home blood pressure readings are at goal.  Continue current medications without change.  Continue DASH diet. - spironolactone (ALDACTONE) 25 MG tablet; Take 1 tablet (25 mg total) by mouth daily.  Dispense: 90 tablet; Refill: 3 - lisinopril (PRINIVIL,ZESTRIL) 40 MG tablet; Take 1 tablet (40 mg total) by mouth daily.  Dispense: 90 tablet; Refill: 3  2. Callous ulcer, unspecified ulcer stage ([place]) Patient can continue to shave callus down as needed or see the podiatrist to have them shaved and the toenails clipped.  She feels she cannot afford to see the podiatrist at this time  3. PAD (peripheral artery disease) ([place]) Followed by vascular.  Have upcoming appointment.  Continue with aspirin and Lipitor - Lipid panel - Hepatic Function Panel  4. Pulmonary emphysema, unspecified emphysema type ([place]) Could be better control.  I recommend adding Incruse which will hopefully decrease her use of albuterol.  Advised to wash mouth with warm water after each  use of Advair. - umeclidinium bromide (INCRUSE ELLIPTA) 62.5 MCG/INH AEPB; Inhale 1 puff into the lungs daily.  Dispense: 1 each; Refill: 6  5. Primary osteoarthritis of right shoulder Continue over-the-counter Tylenol and rxn meloxicam   Patient was given the opportunity to ask questions.  Patient  verbalized understanding of the plan and was able to repeat key elements of the plan.   Orders Placed This Encounter  Procedures  . Lipid panel  . Hepatic Function Panel     Requested Prescriptions   Signed Prescriptions Disp Refills  . spironolactone (ALDACTONE) 25 MG tablet 90 tablet 3    Sig: Take 1 tablet (25 mg total) by mouth daily.  [name] lisinopril (PRINIVIL,ZESTRIL) 40 MG tablet 90 tablet 3    Sig: Take 1 tablet (40 mg total) by mouth daily.  [name] umeclidinium bromide (INCRUSE ELLIPTA) 62.5 MCG/INH AEPB 1 each 6    Sig: Inhale 1 puff into the lungs daily.    Return in about 3 months (around 06/15/2018).  Karle Plumber, MD, FACP

## 2018-03-16 NOTE — Patient Instructions (Signed)
Continue Advair inhaler.  Please rinse mouth with warm water after each use.  We have added a new inhaler called Incruse that she will use once a day.

## 2018-03-19 DIAGNOSIS — E785 Hyperlipidemia, unspecified: Secondary | ICD-10-CM | POA: Diagnosis not present

## 2018-03-19 DIAGNOSIS — I739 Peripheral vascular disease, unspecified: Secondary | ICD-10-CM | POA: Diagnosis not present

## 2018-03-20 LAB — HEPATIC FUNCTION PANEL
ALT: 27 IU/L (ref 0–32)
AST: 23 IU/L (ref 0–40)
Albumin: 4.4 g/dL (ref 3.6–4.8)
Alkaline Phosphatase: 81 IU/L (ref 39–117)
Bilirubin Total: 0.4 mg/dL (ref 0.0–1.2)
Bilirubin, Direct: 0.1 mg/dL (ref 0.00–0.40)
Total Protein: 7.6 g/dL (ref 6.0–8.5)

## 2018-03-20 LAB — LIPID PANEL
Chol/HDL Ratio: 2.6 ratio (ref 0.0–4.4)
Cholesterol, Total: 153 mg/dL (ref 100–199)
HDL: 60 mg/dL (ref >39)
LDL Calculated: 74 mg/dL (ref 0–99)
Triglycerides: 93 mg/dL (ref 0–149)
VLDL Cholesterol Cal: 19 mg/dL (ref 5–40)

## 2018-04-21 ENCOUNTER — Encounter: Payer: Self-pay | Admitting: Family

## 2018-04-21 ENCOUNTER — Ambulatory Visit (HOSPITAL_COMMUNITY): Payer: Medicare HMO

## 2018-04-21 ENCOUNTER — Ambulatory Visit: Payer: Medicare HMO | Admitting: Vascular Surgery

## 2018-05-04 MED FILL — CARVEDILOL 6.25 MG TABLET: 6.25 | 90 days supply | Qty: 180 | Fill #2

## 2018-05-04 MED FILL — AMLODIPINE BESYLATE 10 MG T: 10 | 90 days supply | Qty: 90 | Fill #2

## 2018-05-04 MED FILL — ATORVASTATIN CALCIUM 40 MG: 40 | 90 days supply | Qty: 90 | Fill #2

## 2018-05-05 ENCOUNTER — Other Ambulatory Visit: Payer: Self-pay

## 2018-05-05 ENCOUNTER — Ambulatory Visit (INDEPENDENT_AMBULATORY_CARE_PROVIDER_SITE_OTHER): Payer: Medicare HMO | Admitting: Vascular Surgery

## 2018-05-05 ENCOUNTER — Ambulatory Visit (HOSPITAL_COMMUNITY)
Admission: RE | Admit: 2018-05-05 | Discharge: 2018-05-05 | Disposition: A | Discharge status: Home / Self Care | Payer: Medicare HMO | Source: Ambulatory Visit | Attending: Vascular Surgery | Admitting: Vascular Surgery

## 2018-05-05 ENCOUNTER — Encounter: Payer: Self-pay | Admitting: Vascular Surgery

## 2018-05-05 VITALS — BP 164/84 | HR 60 | Temp 97.7°F | Resp 20 | Ht 64.0 in | Wt 170.5 lb

## 2018-05-05 DIAGNOSIS — I7409 Other arterial embolism and thrombosis of abdominal aorta: Secondary | ICD-10-CM

## 2018-05-05 DIAGNOSIS — I739 Peripheral vascular disease, unspecified: Secondary | ICD-10-CM | POA: Diagnosis not present

## 2018-05-05 NOTE — Progress Notes (Signed)
Patient name: Brandy Stevenson MRN: [id number] DOB: [birth year] Sex: female  REASON FOR VISIT:   Follow-up of aortic occlusion.  HPI:   Brandy Stevenson is a pleasant 63 y.o. female who had previously been followed by Dr. Kellie Simmering with peripheral vascular disease.  The patient was seen by our nurse practitioner in 2017 and I was set up to perform an arteriogram on her.  This showed an occluded infrarenal aorta below the level of the inferior mesenteric artery.  The common iliac arteries were occluded.  I last saw the patient on 10/19/2017.  At that time she was continuing to have bilateral lower extremity claudication at half a block.  Her symptoms were more significant on the right side.  She denied any history of rest pain or nonhealing ulcers.  She was on gabapentin for neuropathy.  She has undergone a previous CT angiogram which showed distal aortic occlusion.  She had single-vessel runoff on the right via the anterior tibial artery and three-vessel runoff on the left.  When I saw her last, her claudication symptoms were stable.  For this reason we favor conservative treatment.  She had quit smoking in December 2018.  We discussed the importance of a structured walking program.  We also discussed nutrition.  If her symptoms progressed I explained that we could consider aortofemoral bypass grafting although her aorta was significantly calcified which would certainly increase the risk of surgery.  The alternative would be an axillobifemoral bypass.  Since I saw her last, she continues to have bilateral lower extremity claudication.  She experiences pain in her thighs and calves which is brought on by ambulation and relieved with rest.  Her symptoms are about the same as they were 6 months ago.  She has not been as active as she has been busy taking care of of her mother.  Of note she also has a history of back pain and does experience some pain in her thighs simply with standing and sitting.  I think this is  likely not related to her peripheral vascular disease.  She quit smoking 2018.  She is on aspirin and is on a statin.  She is on gabapentin for her neuropathy.  Past Medical History:  Diagnosis Date  . Arthritis    back problems to be evaluated by neurology next month  . Cancer ([place]) 2009   L, Breast, radiation and surgery.  [name] COPD (chronic obstructive pulmonary disease) ([place])   . Depression   . GERD (gastroesophageal reflux disease)   . Hyperlipemia   . Hypertension   . Leriche syndrome ([place])    Infrarenal aortic occlusion extending to bilateral internal iliac arteries.  External iliacs fill via collaterals from lumbar arteries to hypogastric/internal iliac arteries -minimal disease on the right side and no disease on the left side leg arteries distally.  . Peripheral vascular disease ([place]) 2013    Family History  Problem Relation Age of Onset  . Hyperlipidemia Mother   . Hypertension Mother   . Heart disease Mother        Atrial Fib.  . Hyperlipidemia Sister   . Hypertension Sister   . Heart disease Sister        Before age 46,  CHF  . Heart attack Brother   . Hyperlipidemia Brother   . Hypertension Brother   . Heart disease Brother   . Hypertension Daughter     SOCIAL HISTORY: Social History   Tobacco Use  . Smoking status: Former Smoker  Packs/day: 0.25    Years: 40.00    Pack years: 10.00    Types: Cigarettes    Last attempt to quit: 02/08/2017    Years since quitting: 1.2  . Smokeless tobacco: Never Used  . Tobacco comment: 3 cigarettes per day  Substance Use Topics  . Alcohol use: No    Allergies  Allergen Reactions  . Vicodin [Hydrocodone-Acetaminophen] Nausea And Vomiting and Other (See Comments)    Sweating  . Wellbutrin [Bupropion]     Made me feel bad    Current Outpatient Medications  Medication Sig Dispense Refill  . albuterol (PROVENTIL HFA;VENTOLIN HFA) 108 (90 Base) MCG/ACT inhaler INHALE 2 PUFFS EVERY 6 (SIX) HOURS AS NEEDED FOR  WHEEZING. 54 g 0  . ammonium lactate (LAC-HYDRIN) 12 % cream Apply topically as needed for dry skin. 385 g 0  . aspirin EC 81 MG tablet Take 1 tablet (81 mg total) by mouth daily. 90 tablet 3  . carvedilol (COREG) 6.25 MG tablet Take 1 tablet (6.25 mg total) by mouth 2 (two) times daily. 180 tablet 3  . cyclobenzaprine (FLEXERIL) 5 MG tablet TAKE 1 TABLET TWICE DAILY AS NEEDED FOR MUSCLE SPASM(S) 30 tablet 0  . Fluticasone-Salmeterol (ADVAIR) 100-50 MCG/DOSE AEPB Inhale 1 puff into the lungs 2 (two) times daily. 180 each 3  . gabapentin (NEURONTIN) 300 MG capsule Take 1 capsule (300 mg total) by mouth at bedtime. 90 capsule 1  . lisinopril (PRINIVIL,ZESTRIL) 40 MG tablet Take 1 tablet (40 mg total) by mouth daily. 90 tablet 3  . meloxicam (MOBIC) 15 MG tablet Take 1 tablet (15 mg total) by mouth daily. 30 tablet 6  . omeprazole (PRILOSEC) 40 MG capsule Take 1 capsule (40 mg total) by mouth daily. 30 capsule 5  . spironolactone (ALDACTONE) 25 MG tablet Take 1 tablet (25 mg total) by mouth daily. 90 tablet 3  . umeclidinium bromide (INCRUSE ELLIPTA) 62.5 MCG/INH AEPB Inhale 1 puff into the lungs daily. 1 each 6  . amLODipine (NORVASC) 10 MG tablet Take 1 tablet (10 mg total) by mouth at bedtime. 180 tablet 3  . atorvastatin (LIPITOR) 40 MG tablet Take 1 tablet (40 mg total) by mouth daily. 90 tablet 3  . loperamide (IMODIUM A-D) 2 MG tablet Take 1 tablet (2 mg total) by mouth every 8 (eight) hours as needed for diarrhea or loose stools. (Patient not taking: Reported on 05/05/2018) 12 tablet 0   No current facility-administered medications for this visit.     REVIEW OF SYSTEMS:  [X]  denotes positive finding, [ ]  denotes negative finding Cardiac  Comments:  Chest pain or chest pressure:    Shortness of breath upon exertion:    Short of breath when lying flat:    Irregular heart rhythm:        Vascular    Pain in calf, thigh, or hip brought on by ambulation: x   Pain in feet at night that wakes  you up from your sleep:     Blood clot in your veins:    Leg swelling:  x       Pulmonary    Oxygen at home:    Productive cough:     Wheezing:         Neurologic    Sudden weakness in arms or legs:     Sudden numbness in arms or legs:     Sudden onset of difficulty speaking or slurred speech:    Temporary loss of vision in one eye:  Problems with dizziness:         Gastrointestinal    Blood in stool:     Vomited blood:         Genitourinary    Burning when urinating:     Blood in urine:        Psychiatric    Major depression:         Hematologic    Bleeding problems:    Problems with blood clotting too easily:        Skin    Rashes or ulcers:        Constitutional    Fever or chills:     PHYSICAL EXAM:   Vitals:   05/05/18 1539  BP: (!) 164/84  Pulse: 60  Resp: 20  Temp: 97.7 F (36.5 C)  SpO2: 98%  Weight: 170 lb 8.4 oz (77.3 kg)  Height: 5\' 4"  (1.626 m)    GENERAL: The patient is a well-nourished female, in no acute distress. The vital signs are documented above. CARDIAC: There is a regular rate and rhythm.  VASCULAR: I do not detect carotid bruits. I cannot palpate femoral or pedal pulses.  Both feet are warm and well-perfused.  She has no significant lower extremity swelling. PULMONARY: There is good air exchange bilaterally without wheezing or rales. ABDOMEN: Soft and non-tender with normal pitched bowel sounds.  MUSCULOSKELETAL: There are no major deformities or cyanosis. NEUROLOGIC: No focal weakness or paresthesias are detected. SKIN: There are no ulcers or rashes noted. PSYCHIATRIC: The patient has a normal affect.  DATA:    ARTERIAL DOPPLER STUDY: I have independently interpreted her arterial Doppler study.  On the right side there is a monophasic dorsalis pedis and posterior tibial signal.  ABI is 53%.  Toe pressure on the right is 58 mmHg.  Her ABI was previously 61% on the right.  On the left side she had a mild monophasic dorsalis  pedis and posterior tibial signal.  ABI was 60%.  Toe pressure on the left was 49 mmHg.  Previous ABI on the left was 59%.  CT SCAN ABDOMEN PELVIS: I did review her previous CT scan which shows significant calcific disease in her infrarenal aorta and bilateral common iliac arteries.  She also has posterior plaque in her common femoral arteries bilaterally.  MEDICAL ISSUES:   AORTIC OCCLUSION: This patient has stable bilateral lower extremity claudication.  She does not have any rest pain or nonhealing ulcers.  She quit smoking in 2018.  I encouraged her to begin walking again hopefully as the weather warms up.  I think we can try to avoid surgery as this would be associated with increased risk given her significant calcific disease in the aorta as seen on her CT scan.  If her symptoms did progress then I think we could consider aortofemoral bypass grafting although again this could be complicated by the calcific disease in the infrarenal aorta.  The alternative would be an axillobifemoral bypass graft.  However after reviewing the films I think we could potentially clamp just below the renals for an aortofemoral graft.  Deitra Mayo [facility] [phone]

## 2018-05-21 MED FILL — SPIRONOLACTONE 25 MG TABLET: 25 | 90 days supply | Qty: 90 | Fill #1

## 2018-05-31 MED FILL — LISINOPRIL 40 MG TABLET: 40 | 90 days supply | Qty: 90 | Fill #0

## 2018-05-31 MED FILL — MELOXICAM 15 MG TABLET: 15 | 90 days supply | Qty: 90 | Fill #2

## 2018-05-31 MED FILL — OMEPRAZOLE DR 40 MG CAPSULE: 40 | 90 days supply | Qty: 90 | Fill #2

## 2018-06-02 ENCOUNTER — Ambulatory Visit: Payer: Medicare HMO | Attending: Family Medicine | Admitting: Family Medicine

## 2018-06-02 ENCOUNTER — Other Ambulatory Visit: Payer: Self-pay | Admitting: Family Medicine

## 2018-06-02 ENCOUNTER — Encounter: Payer: Self-pay | Admitting: Family Medicine

## 2018-06-02 DIAGNOSIS — Z1231 Encounter for screening mammogram for malignant neoplasm of breast: Secondary | ICD-10-CM

## 2018-06-02 DIAGNOSIS — R1030 Lower abdominal pain, unspecified: Secondary | ICD-10-CM | POA: Diagnosis not present

## 2018-06-02 DIAGNOSIS — Z853 Personal history of malignant neoplasm of breast: Secondary | ICD-10-CM | POA: Diagnosis not present

## 2018-06-02 DIAGNOSIS — R350 Frequency of micturition: Secondary | ICD-10-CM

## 2018-06-02 DIAGNOSIS — N3001 Acute cystitis with hematuria: Secondary | ICD-10-CM

## 2018-06-02 DIAGNOSIS — J449 Chronic obstructive pulmonary disease, unspecified: Secondary | ICD-10-CM

## 2018-06-02 DIAGNOSIS — N3000 Acute cystitis without hematuria: Secondary | ICD-10-CM | POA: Diagnosis not present

## 2018-06-02 LAB — POCT URINALYSIS DIP (CLINITEK)
Bilirubin, UA: NEGATIVE
Glucose, UA: NEGATIVE mg/dL
Ketones, POC UA: NEGATIVE mg/dL
Leukocytes, UA: NEGATIVE
Nitrite, UA: NEGATIVE
POC PROTEIN,UA: NEGATIVE
Spec Grav, UA: <=1.005 — AB
Urobilinogen, UA: 0.2 U/dL
pH, UA: 7

## 2018-06-02 MED ORDER — ALBUTEROL SULFATE HFA 108 (90 BASE) MCG/ACT IN AERS: 2.0000 | INHALATION_SPRAY | Freq: Four times a day (QID) | RESPIRATORY_TRACT | 2 refills | Status: DC | PRN

## 2018-06-02 MED ORDER — SULFAMETHOXAZOLE-TRIMETHOPRIM 800-160 MG PO TABS: 1.0000 | ORAL_TABLET | Freq: Two times a day (BID) | ORAL | 0 refills | Status: AC

## 2018-06-02 MED FILL — ALBUTEROL SULFATE HFA 108 (: 108 (90 BAS | 25 days supply | Qty: 18 | Fill #0

## 2018-06-02 MED FILL — SULFAMETHOXAZOLE-TMP DS TAB: 800-160 | 3 days supply | Qty: 6 | Fill #0

## 2018-06-02 NOTE — Progress Notes (Signed)
Patient verified DOB Patient has eaten today and patient has taken medication today, she is using tylenol every 4 hours to help with minimal relief. Patient last took tylenol around 12:30. Pain is described as a sharp intermittent pain in the left side of the lower abdomen. Pain began Sunday with swelling, not tender to the touch and not hot.  Patient needs Incruse inhaler refilled.

## 2018-06-02 NOTE — Progress Notes (Signed)
Virtual Visit via Telephone Note  I connected with Brandy Stevenson on 06/02/18 at 2:09 EST by telephone and verified that I am speaking with the correct person using two identifiers.   I discussed the limitations, risks, security and privacy concerns of performing an evaluation and management service by telephone and the availability of in person appointments. I also discussed with the patient that there may be a patient responsible charge related to this service. The patient expressed understanding and agreed to proceed.  Due to current restrictions/limitations of in-office visits due to the COVID-19 pandemic, this scheduled clinical appointment was converted to a telehealth visit  History of Present Illness:      63 year old female who has complaint of onset of intermittent sharp, shooting pain in her mid to left lower abdomen which radiates towards her groin.  Patient states that the pain is about a 4-5 on a 0-to-10 scale, " enough to get your attention".  She has also noticed an increase in urinary frequency which started on Sunday.  Patient states that she feels as if she has to urinate every 10 to 15 minutes.  Patient denies any history of diabetes.  She also reports increase in mid back pain especially with movement for the last week which has been greater than her usual back pain.  Patient reports that she has multiple underlying elements including osteoarthritis, degenerative disc disease, peripheral vascular disease, COPD as well as a history of breast cancer for which she has been cancer free for the past 11 years.  Patient reports that she did not have her mammogram done last year and needs to have this rescheduled.      She denies any fever or chills.  Patient has had no recent nausea.  Patient has had some mild increase shortness of breath which she attributes to the weather and patient needs a refill of albuterol which she uses as needed but states that she finished her inhaler yesterday and  now needs a refill.  Patient also has COPD but does not need any of her other inhalers refilled at this time.  Regarding her abdominal pain, patient denies any blood in the stool, no dark stools.  No history of diverticulosis of diverticulitis.  She reports no diarrhea or constipation.  Past Medical History:  Diagnosis Date  . Arthritis    back problems to be evaluated by neurology next month  . Cancer ([place]) 2009   L, Breast, radiation and surgery.  [name] COPD (chronic obstructive pulmonary disease) ([place])   . Depression   . GERD (gastroesophageal reflux disease)   . Hyperlipemia   . Hypertension   . Leriche syndrome ([place])    Infrarenal aortic occlusion extending to bilateral internal iliac arteries.  External iliacs fill via collaterals from lumbar arteries to hypogastric/internal iliac arteries -minimal disease on the right side and no disease on the left side leg arteries distally.  . Peripheral vascular disease ([place]) 2013   Past Surgical History:  Procedure Laterality Date  . APPENDECTOMY  1980  . BREAST LUMPECTOMY Left    s/p radiation therapy   . BREAST SURGERY    . COLONOSCOPY  07/2015  . COLONOSCOPY WITH PROPOFOL N/A 06/26/2015   Procedure: COLONOSCOPY WITH PROPOFOL;  Surgeon: [name], MD;  Location: [facility];  Service: Endoscopy;  Laterality: N/A;  . CT CTA CORONARY W/CA SCORE W/CM &/OR WO/CM  12/10/2017   Coronary calcium score is 0.  No evidence of CAD.  [name] PERIPHERAL VASCULAR CATHETERIZATION N/A 10/22/2015  Procedure: Abdominal Aortogram w/Lower Extremity;  Surgeon: [name], MD;  Location: [facility] INVASIVE CV LAB: Aortic arch widely patent as far as innominate artery, right subclavian artery, right vertebral artery and right, common carotid.  Bilateral single renal arteries - Nonocclusive disease. Infrarenal aorta 100% - bilateral common Iliac 100% --> B Ex Iliacs fill via Int Iliac. Leg OK  . TRANSTHORACIC ECHOCARDIOGRAM  08/2017   EF 60-65%. No RWMA.   Normal DF.  Normal valves.   . TUBAL LIGATION  1991   Family History  Problem Relation Age of Onset  . Hyperlipidemia Mother   . Hypertension Mother   . Heart disease Mother        Atrial Fib.  . Hyperlipidemia Sister   . Hypertension Sister   . Heart disease Sister        Before age 33,  CHF  . Heart attack Brother   . Hyperlipidemia Brother   . Hypertension Brother   . Heart disease Brother   . Hypertension Daughter    Social History   Tobacco Use  . Smoking status: Former Smoker    Packs/day: 0.25    Years: 40.00    Pack years: 10.00    Types: Cigarettes    Last attempt to quit: 02/08/2017    Years since quitting: 1.3  . Smokeless tobacco: Never Used  . Tobacco comment: 3 cigarettes per day  Substance Use Topics  . Alcohol use: No  . Drug use: No  . Allergies  Allergen Reactions  . Vicodin [Hydrocodone-Acetaminophen] Nausea And Vomiting and Other (See Comments)    Sweating  . Wellbutrin [Bupropion]     Made me feel bad     Observations/Objective:  No vital signs or physical examination performed as visit was done via telephone encounter Review of Systems  Constitutional: Negative for chills and fever.  Respiratory: Positive for shortness of breath (recent mild increase).   Cardiovascular: Negative for chest pain and palpitations.  Gastrointestinal: Positive for abdominal pain. Negative for blood in stool, constipation, diarrhea, melena and nausea.  Genitourinary: Positive for frequency and urgency. Negative for dysuria.  Musculoskeletal: Positive for back pain and joint pain.  Neurological: Negative for dizziness and headaches.  Endo/Heme/Allergies: Negative for polydipsia. Does not bruise/bleed easily.   Assessment and Plan: 1. Lower abdominal pain I discussed with the patient that she likely has a urinary tract infection based on symptoms of lower abdominal pain, urinary frequency, urgency and increased back pain.  Patient denied any history of  diverticulosis or diverticulitis.  No blood in the stool and no dark stools/melena.  Patient will come into the office to pick up prescription for antibiotic therapy for acute cystitis and at that time, patient will give sample for urinalysis.  Urinalysis will be sent for culture.  If no bacterial growth and patient with continued lower abdominal pain she will need in person visit for further evaluation - POCT URINALYSIS DIP (CLINITEK)  2. Urinary frequency Patient with complaint of urinary frequency urgency and increased back pain.  Patient denies any history of diabetes.  Patient with glucose of 85 on most recent BMP done 10/27/2017 and creatinine was normal at 0.85.  Discussed with the patient that the most likely cause of her urinary frequency along with lower abdominal discomfort/pain would be urinary tract infection and patient was asked to give sample for urinalysis when she comes into the clinic later today to pick up her prescriptions - POCT URINALYSIS DIP (CLINITEK)  3. Acute cystitis with hematuria  Prescription will be sent to pharmacy for Bactrim DS 800 mg twice daily x3 days which will treat most uncomplicated urinary tract infections.  Urine will also be sent for culture and patient will be notified if further treatment is needed based on the culture results. - sulfamethoxazole-trimethoprim (BACTRIM DS,SEPTRA DS) 800-160 MG tablet; Take 1 tablet by mouth 2 (two) times daily for 3 days.  Dispense: 6 tablet; Refill: 0  4. Chronic obstructive pulmonary disease, unspecified COPD type ([place]) Patient reports history of COPD and states that she used the last of her albuterol inhaler yesterday.  New prescription will be sent in for albuterol inhaler as patient states that she does not wish to wait for 90-day supply from mail order - albuterol (PROVENTIL HFA;VENTOLIN HFA) 108 (90 Base) MCG/ACT inhaler; Inhale 2 puffs into the lungs every 6 (six) hours as needed for wheezing or shortness of breath.   Dispense: 1 Inhaler; Refill: 2  5. History of breast cancer Patient reports a history of breast cancer and states that she missed her mammogram last year.  Order placed for diagnostic mammogram in follow-up of history of breast cancer - [facility] Digital Diagnostic Bilat; Future  Allergies as of 06/02/2018      Reactions   Vicodin [hydrocodone-acetaminophen] Nausea And Vomiting, Other (See Comments)   Sweating   Wellbutrin [bupropion]    Made me feel bad      Medication List       Accurate as of June 02, 2018  3:08 PM. Always use your most recent med list.        albuterol 108 (90 Base) MCG/ACT inhaler Commonly known as:  PROVENTIL HFA;VENTOLIN HFA Inhale 2 puffs into the lungs every 6 (six) hours as needed for wheezing or shortness of breath.   amLODipine 10 MG tablet Commonly known as:  NORVASC Take 1 tablet (10 mg total) by mouth at bedtime.   ammonium lactate 12 % cream Commonly known as:  Lac-Hydrin Apply topically as needed for dry skin.   aspirin EC 81 MG tablet Take 1 tablet (81 mg total) by mouth daily.   atorvastatin 40 MG tablet Commonly known as:  LIPITOR Take 1 tablet (40 mg total) by mouth daily.   carvedilol 6.25 MG tablet Commonly known as:  COREG Take 1 tablet (6.25 mg total) by mouth 2 (two) times daily.   cyclobenzaprine 5 MG tablet Commonly known as:  FLEXERIL TAKE 1 TABLET TWICE DAILY AS NEEDED FOR MUSCLE SPASM(S)   Fluticasone-Salmeterol 100-50 MCG/DOSE Aepb Commonly known as:  ADVAIR Inhale 1 puff into the lungs 2 (two) times daily.   gabapentin 300 MG capsule Commonly known as:  NEURONTIN Take 1 capsule (300 mg total) by mouth at bedtime.   lisinopril 40 MG tablet Commonly known as:  PRINIVIL,ZESTRIL Take 1 tablet (40 mg total) by mouth daily.   loperamide 2 MG tablet Commonly known as:  Imodium A-D Take 1 tablet (2 mg total) by mouth every 8 (eight) hours as needed for diarrhea or loose stools.   meloxicam 15 MG tablet Commonly known  as:  MOBIC Take 1 tablet (15 mg total) by mouth daily.   omeprazole 40 MG capsule Commonly known as:  PRILOSEC Take 1 capsule (40 mg total) by mouth daily.   spironolactone 25 MG tablet Commonly known as:  ALDACTONE Take 1 tablet (25 mg total) by mouth daily.   sulfamethoxazole-trimethoprim 800-160 MG tablet Commonly known as:  BACTRIM DS,SEPTRA DS Take 1 tablet by mouth 2 (two) times daily for  3 days.   umeclidinium bromide 62.5 MCG/INH Aepb Commonly known as:  Incruse Ellipta Inhale 1 puff into the lungs daily.        Follow Up Instructions:Return in about 1 week (around 06/09/2018), or if symptoms worsen or fail to improve.   I discussed the assessment and treatment plan with the patient. The patient was provided an opportunity to ask questions and all were answered. The patient agreed with the plan and demonstrated an understanding of the instructions.   The patient was advised to call back or seek an in-person evaluation if the symptoms worsen or if the condition fails to improve as anticipated.  I provided 13 minutes of non-face-to-face time during this encounter.   Antony Blackbird, MD

## 2018-06-04 ENCOUNTER — Telehealth: Payer: Self-pay | Admitting: *Deleted

## 2018-06-04 DIAGNOSIS — J418 Mixed simple and mucopurulent chronic bronchitis: Secondary | ICD-10-CM

## 2018-06-04 LAB — URINE CULTURE

## 2018-06-04 MED ORDER — FLUTICASONE-SALMETEROL 100-50 MCG/DOSE IN AEPB: 1.0000 | INHALATION_SPRAY | Freq: Two times a day (BID) | RESPIRATORY_TRACT | 3 refills | Status: DC

## 2018-06-04 NOTE — Telephone Encounter (Signed)
Patient verified DOB Patient is aware of needing to complete the bactrim and report to the office is the urinary symptoms do not clear. Patient complains of a headache in the back of her head going down to her neck with a runny nose. Patient complains that she does not believe the Incruse inhaler is helping. She states she felt better with the Advair.

## 2018-06-10 ENCOUNTER — Telehealth: Payer: Self-pay | Admitting: Internal Medicine

## 2018-06-10 DIAGNOSIS — M545 Low back pain, unspecified: Secondary | ICD-10-CM

## 2018-06-10 DIAGNOSIS — G8929 Other chronic pain: Secondary | ICD-10-CM

## 2018-06-10 MED ORDER — CYCLOBENZAPRINE HCL 5 MG PO TABS: ORAL_TABLET | ORAL | 0 refills | Status: DC

## 2018-06-10 NOTE — Telephone Encounter (Signed)
Patients call returned.  Patient identified by name and date of birth.  Patient advised that Dr. Margarita Rana has refilled a medication and to continue with present course of action.  Patient acknowledged understanding of information.

## 2018-06-10 NOTE — Telephone Encounter (Signed)
Patients call returned.  Patient identified by name and date of birth.  Patient states she was given Bactrim for her urinary situation.  Patient has urinary frequency but no burning upon urination or no pain upon urination. Patient endorses left sided intermittent lower back pain  Patient told to stop Bactrim.  Patients note to be forwarded to Providers.  Patient told to drink cranberry juice. Patient advised that if symptoms did not improve then they should go to the Emergency Department or Urgent Care.

## 2018-06-10 NOTE — Telephone Encounter (Signed)
Given UA and urine culture negative for UTI I would advise to stop Bactrim given side effects and the recommendation you gave her are in order.  I have sent a refill for Flexeril to her [place] which she was previously given by her PCP.

## 2018-06-10 NOTE — Telephone Encounter (Signed)
Patient called stating that the [facility] made her sick

## 2018-06-28 ENCOUNTER — Ambulatory Visit (HOSPITAL_BASED_OUTPATIENT_CLINIC_OR_DEPARTMENT_OTHER): Payer: Medicare HMO | Admitting: Internal Medicine

## 2018-06-28 ENCOUNTER — Other Ambulatory Visit: Payer: Self-pay

## 2018-06-28 DIAGNOSIS — Z538 Procedure and treatment not carried out for other reasons: Secondary | ICD-10-CM

## 2018-06-28 NOTE — Progress Notes (Signed)
This tele-appt was cancelled as pt requested an in-person visit.  The in-person visit was scheduled.

## 2018-06-29 ENCOUNTER — Ambulatory Visit: Payer: Medicare HMO | Attending: Internal Medicine | Admitting: Internal Medicine

## 2018-06-29 ENCOUNTER — Other Ambulatory Visit: Payer: Self-pay

## 2018-06-29 ENCOUNTER — Encounter: Payer: Self-pay | Admitting: Internal Medicine

## 2018-06-29 VITALS — BP 161/91 | HR 59 | Temp 98.1°F | Resp 16 | Wt 167.8 lb

## 2018-06-29 DIAGNOSIS — R350 Frequency of micturition: Secondary | ICD-10-CM

## 2018-06-29 DIAGNOSIS — Z853 Personal history of malignant neoplasm of breast: Secondary | ICD-10-CM

## 2018-06-29 DIAGNOSIS — I10 Essential (primary) hypertension: Secondary | ICD-10-CM | POA: Diagnosis not present

## 2018-06-29 DIAGNOSIS — J302 Other seasonal allergic rhinitis: Secondary | ICD-10-CM

## 2018-06-29 DIAGNOSIS — J449 Chronic obstructive pulmonary disease, unspecified: Secondary | ICD-10-CM

## 2018-06-29 DIAGNOSIS — F419 Anxiety disorder, unspecified: Secondary | ICD-10-CM

## 2018-06-29 LAB — POCT URINALYSIS DIP (CLINITEK)
Bilirubin, UA: NEGATIVE
Glucose, UA: NEGATIVE mg/dL
Ketones, POC UA: NEGATIVE mg/dL
Leukocytes, UA: NEGATIVE
Nitrite, UA: NEGATIVE
POC PROTEIN,UA: NEGATIVE
Spec Grav, UA: <=1.005 — AB (ref 1.010–1.025)
Urobilinogen, UA: 0.2 E.U./dL
pH, UA: 7 (ref 5.0–8.0)

## 2018-06-29 MED ORDER — FLUTICASONE-SALMETEROL 100-50 MCG/DOSE IN AEPB: 1.0000 | INHALATION_SPRAY | Freq: Two times a day (BID) | RESPIRATORY_TRACT | 3 refills | Status: DC

## 2018-06-29 MED FILL — ADVAIR 100/50 DISKUS: 100-50 | 30 days supply | Qty: 60 | Fill #0

## 2018-06-29 NOTE — Progress Notes (Signed)
Patient ID: Brandy Stevenson, female    DOB: 1956/02/23  MRN: [id number]  CC: Hypertension   Subjective: Brandy Stevenson is a 63 y.o. female who presents for chronic ds management Her concerns today include:  Hx of PAD, HTN, HL,formertob dep, LT breast CA (lumpectomy and XRT 2009), GERD, COPD, Trigeminal neuralgia RT, dep/anxiety, OA shoulders  Seen by Dr. Chapman Fitch earlier this mth with abdominal pain.  Given abx for possible UTI.   Did not tolerate the Bactrim - caused HA, vomiting and chills.  States she called back and was told to drink cranberry juice BID.  Does not like Cranberry juice.  Started drinking six to eight 16 oz bottle water a day.  Drinking more water also to avoid drinking sodas.  Husband is pre-DM and they both stopped drinking soda.  No dysuria or hematuria.  Would like to have urine rechecked today  HYPERTENSION Currently taking: see medication list Med Adherence: [x]  Yes    []  No Medication side effects: []  Yes    [x]  No Adherence with salt restriction: [x]  Yes    []  No Home Monitoring?: [x]  Yes    []  No Monitoring Frequency:  daily Home BP results range:  This a.m was 136/83.  Readings this past wk 139/81, 144/87, 138/88, 135/79, 137/85 and 143/92.  March readings were very good with SBP in low 130s-120/70s to low 80s.  She feels her blood pressure has been a little higher this month due to anxiety and stress of COVID pandemic SOB? []  Yes    [x]  No Chest Pain?: []  Yes    [x]  No Leg swelling?: []  Yes    [x]  No Headaches?: [x]  Yes, only when BP elev    []  No Dizziness? []  Yes    [x]  No Comments:   Loss 12 lbs since 03/2018.  Attributes this to changes in eating habits and not drinking 5 to 6 cans of sodas a day as she was doing.  She was getting out and walking several times a week but has not done it as much over the past several weeks due to failure of COVID-19.  She reports some mild discomfort in her legs with walking but has improved.  She does not have any rest  pain in the legs.  She saw her vascular surgeon Dr. Scot Dock last month.  She has stable bilateral lower extremity claudication.  Not at this stage of needing surgery.  COPD: Reports overall that she is doing okay.  She has not had increase use of albuterol inhaler.  As a matter of fact she has not had to use it every day.  She uses it only if she is going to do a lot of walking.  Incruse caused dry mouh so she d/c taking.  Was out of Advair for a few wks.  Just RF today. Positive allergy symptoms when she goes out doors for the past few wks due to increased pollen  Breast CA: Overdue for mammogram.  Referral was submitted last month by Dr. Chapman Fitch.  However patient states she has not received a call as yet for an appointment.   Patient Active Problem List   Diagnosis Date Noted  . Callous ulcer ([place]) 03/16/2018  . Aortoiliac occlusive disease ([place]) 11/04/2017  . DOE (dyspnea on exertion) 11/03/2017  . Atypical chest pain 11/03/2017  . Ichthyosis 10/06/2017  . Former smoker 04/07/2017  . Primary osteoarthritis of right shoulder 04/07/2017  . Chronic midline low back pain without sciatica 04/07/2017  .  Hiatal hernia 05/01/2016  . DJD (degenerative joint disease) of cervical spine 04/17/2016  . History of breast cancer 02/18/2016  . Foot callus 02/18/2016  . Anxiety and depression 05/22/2015  . Insomnia 03/15/2015  . Unspecified vitamin D deficiency 04/08/2013  . Hyperlipidemia with target LDL less than 70 04/08/2013  . Atherosclerotic PVD with intermittent claudication ([place]) 03/22/2013  . Accelerated hypertension 02/23/2013  . GERD (gastroesophageal reflux disease) 02/23/2013  . COPD (chronic obstructive pulmonary disease) ([place]) 02/23/2013     Current Outpatient Medications on File Prior to Visit  Medication Sig Dispense Refill  . albuterol (PROVENTIL HFA;VENTOLIN HFA) 108 (90 Base) MCG/ACT inhaler Inhale 2 puffs into the lungs every 6 (six) hours as needed for wheezing or shortness of  breath. 1 Inhaler 2  . amLODipine (NORVASC) 10 MG tablet Take 1 tablet (10 mg total) by mouth at bedtime. 180 tablet 3  . ammonium lactate (LAC-HYDRIN) 12 % cream Apply topically as needed for dry skin. 385 g 0  . aspirin EC 81 MG tablet Take 1 tablet (81 mg total) by mouth daily. 90 tablet 3  . atorvastatin (LIPITOR) 40 MG tablet Take 1 tablet (40 mg total) by mouth daily. 90 tablet 3  . carvedilol (COREG) 6.25 MG tablet Take 1 tablet (6.25 mg total) by mouth 2 (two) times daily. 180 tablet 3  . cyclobenzaprine (FLEXERIL) 5 MG tablet TAKE 1 TABLET TWICE DAILY AS NEEDED FOR MUSCLE SPASM(S) 30 tablet 0  . Fluticasone-Salmeterol (ADVAIR) 100-50 MCG/DOSE AEPB Inhale 1 puff into the lungs 2 (two) times daily. 180 each 3  . gabapentin (NEURONTIN) 300 MG capsule Take 1 capsule (300 mg total) by mouth at bedtime. 90 capsule 1  . lisinopril (PRINIVIL,ZESTRIL) 40 MG tablet Take 1 tablet (40 mg total) by mouth daily. 90 tablet 3  . loperamide (IMODIUM A-D) 2 MG tablet Take 1 tablet (2 mg total) by mouth every 8 (eight) hours as needed for diarrhea or loose stools. (Patient not taking: Reported on 06/02/2018) 12 tablet 0  . meloxicam (MOBIC) 15 MG tablet Take 1 tablet (15 mg total) by mouth daily. 30 tablet 6  . omeprazole (PRILOSEC) 40 MG capsule Take 1 capsule (40 mg total) by mouth daily. 30 capsule 5  . spironolactone (ALDACTONE) 25 MG tablet Take 1 tablet (25 mg total) by mouth daily. 90 tablet 3  . umeclidinium bromide (INCRUSE ELLIPTA) 62.5 MCG/INH AEPB Inhale 1 puff into the lungs daily. 1 each 6   No current facility-administered medications on file prior to visit.     Allergies  Allergen Reactions  . Vicodin [Hydrocodone-Acetaminophen] Nausea And Vomiting and Other (See Comments)    Sweating  . Wellbutrin [Bupropion]     Made me feel bad    Social History   Socioeconomic History  . Marital status: Married    Spouse name: Not on file  . Number of children: Not on file  . Years of  education: Not on file  . Highest education level: Not on file  Occupational History  . Not on file  Social Needs  . Financial resource strain: Not on file  . Food insecurity:    Worry: Not on file    Inability: Not on file  . Transportation needs:    Medical: Not on file    Non-medical: Not on file  Tobacco Use  . Smoking status: Former Smoker    Packs/day: 0.25    Years: 40.00    Pack years: 10.00    Types: Cigarettes  Last attempt to quit: 02/08/2017    Years since quitting: 1.3  . Smokeless tobacco: Never Used  . Tobacco comment: 3 cigarettes per day  Substance and Sexual Activity  . Alcohol use: No  . Drug use: No  . Sexual activity: Not Currently  Lifestyle  . Physical activity:    Days per week: Not on file    Minutes per session: Not on file  . Stress: Not on file  Relationships  . Social connections:    Talks on phone: Not on file    Gets together: Not on file    Attends religious service: Not on file    Active member of club or organization: Not on file    Attends meetings of clubs or organizations: Not on file    Relationship status: Not on file  . Intimate partner violence:    Fear of current or ex partner: Not on file    Emotionally abused: Not on file    Physically abused: Not on file    Forced sexual activity: Not on file  Other Topics Concern  . Not on file  Social History Narrative  . Not on file    Family History  Problem Relation Age of Onset  . Hyperlipidemia Mother   . Hypertension Mother   . Heart disease Mother        Atrial Fib.  . Hyperlipidemia Sister   . Hypertension Sister   . Heart disease Sister        Before age 75,  CHF  . Heart attack Brother   . Hyperlipidemia Brother   . Hypertension Brother   . Heart disease Brother   . Hypertension Daughter     Past Surgical History:  Procedure Laterality Date  . APPENDECTOMY  1980  . BREAST LUMPECTOMY Left    s/p radiation therapy   . BREAST SURGERY    . COLONOSCOPY   07/2015  . COLONOSCOPY WITH PROPOFOL N/A 06/26/2015   Procedure: COLONOSCOPY WITH PROPOFOL;  Surgeon: [name], MD;  Location: [facility];  Service: Endoscopy;  Laterality: N/A;  . CT CTA CORONARY W/CA SCORE W/CM &/OR WO/CM  12/10/2017   Coronary calcium score is 0.  No evidence of CAD.  [name] PERIPHERAL VASCULAR CATHETERIZATION N/A 10/22/2015   Procedure: Abdominal Aortogram w/Lower Extremity;  Surgeon: [name], MD;  Location: [facility] INVASIVE CV LAB: Aortic arch widely patent as far as innominate artery, right subclavian artery, right vertebral artery and right, common carotid.  Bilateral single renal arteries - Nonocclusive disease. Infrarenal aorta 100% - bilateral common Iliac 100% --> B Ex Iliacs fill via Int Iliac. Leg OK  . TRANSTHORACIC ECHOCARDIOGRAM  08/2017   EF 60-65%. No RWMA.  Normal DF.  Normal valves.   . TUBAL LIGATION  1991    ROS: Review of Systems Negative except as stated above  PHYSICAL EXAM: BP (!) 161/91   Pulse (!) 59   Temp 98.1 F (36.7 C) (Oral)   Resp 16   Wt 167 lb 12.8 oz (76.1 kg)   SpO2 100%   BMI 28.80 kg/m   Wt Readings from Last 3 Encounters:  06/29/18 167 lb 12.8 oz (76.1 kg)  05/05/18 170 lb 8.4 oz (77.3 kg)  03/16/18 179 lb (81.2 kg)  Repeat BP 160/85 Physical Exam  General appearance - alert, well appearing, and in no distress Mental status - normal mood, behavior, speech, dress, motor activity, and thought processes Mouth - mucous membranes moist, pharynx normal without lesions  Neck - supple, no significant adenopathy Chest - clear to auscultation, no wheezes, rales or rhonchi, symmetric air entry Heart - normal rate, regular rhythm, normal S1, S2, no murmurs, rubs, clicks or gallops Extremities -no lower extremity edema   CMP Latest Ref Rng & Units 03/19/2018 12/10/2017 10/27/2017  Glucose 65 - 99 mg/dL - - 85  BUN 8 - 27 mg/dL - - 18  Creatinine 0.44 - 1.00 mg/dL - 0.90 0.85  Sodium 134 - 144 mmol/L - - 146(H)   Potassium 3.5 - 5.2 mmol/L - - 3.9  Chloride 96 - 106 mmol/L - - 104  CO2 20 - 29 mmol/L - - 22  Calcium 8.7 - 10.3 mg/dL - - 9.2  Total Protein 6.0 - 8.5 g/dL 7.6 - -  Total Bilirubin 0.0 - 1.2 mg/dL 0.4 - -  Alkaline Phos 39 - 117 IU/L 81 - -  AST 0 - 40 IU/L 23 - -  ALT 0 - 32 IU/L 27 - -   Lipid Panel     Component Value Date/Time   CHOL 153 03/19/2018 1024   TRIG 93 03/19/2018 1024   HDL 60 03/19/2018 1024   CHOLHDL 2.6 03/19/2018 1024   CHOLHDL 2.8 07/27/2014 1207   VLDL 32 07/27/2014 1207   LDLCALC 74 03/19/2018 1024    CBC    Component Value Date/Time   WBC 6.6 08/25/2017 0118   RBC 4.22 08/25/2017 0118   HGB 12.2 09/29/2017 0526   HGB 11.4 07/06/2017 1046   HGB 13.8 06/12/2008 0939   HCT 36.0 09/29/2017 0526   HCT 33.2 (L) 07/06/2017 1046   HCT 40.2 06/12/2008 0939   PLT 230 08/25/2017 0118   PLT 191 07/06/2017 1046   MCV 93.6 08/25/2017 0118   MCV 91 07/06/2017 1046   MCV 90.1 06/12/2008 0939   MCH 30.3 08/25/2017 0118   MCHC 32.4 08/25/2017 0118   RDW 13.8 08/25/2017 0118   RDW 14.0 07/06/2017 1046   RDW 13.9 06/12/2008 0939   LYMPHSABS 0.4 (L) 03/10/2017 1335   LYMPHSABS 1.5 06/12/2008 0939   MONOABS 0.5 03/23/2016 2308   MONOABS 0.6 06/12/2008 0939   EOSABS 0.1 03/10/2017 1335   BASOSABS 0.0 03/10/2017 1335   BASOSABS 0.0 06/12/2008 0939   Depression screen PHQ 2/9 06/29/2018 03/16/2018 01/18/2018  Decreased Interest 2 1 1   Down, Depressed, Hopeless 1 1 1   PHQ - 2 Score 3 2 2   Altered sleeping 3 2 3   Tired, decreased energy 2 2 2   Change in appetite 1 1 2   Feeling bad or failure about yourself  0 0 0  Trouble concentrating 1 1 0  Moving slowly or fidgety/restless 0 1 1  Suicidal thoughts 0 0 0  PHQ-9 Score 10 9 10   Some recent data might be hidden   GAD 7 : Generalized Anxiety Score 06/29/2018 03/16/2018 01/18/2018 01/07/2018  Nervous, Anxious, on Edge 1 1 1 2   Control/stop worrying 2 1 3 3   Worry too much - different things 2 2 2 3    Trouble relaxing 2 2 2 2   Restless 1 1 1 2   Easily annoyed or irritable 1 2 1 1   Afraid - awful might happen 1 1 1 1   Total GAD 7 Score 10 10 11 14   Anxiety Difficulty - - - -     ASSESSMENT AND PLAN: 1. Essential hypertension Blood pressure elevated today in the office.  However home blood pressure readings have been lower and closer to goal.  We  will have her continue current doses of amlodipine, carvedilol, Spironolactone and lisinopril. Discussed ways to help relieve anxiety and stress.  See #6 below - Comprehensive metabolic panel  2. Urinary frequency UA not consistent with UTI.  However trace blood on dip so we will order a full urinalysis. - POCT URINALYSIS DIP ([facility]) - Urinalysis  3. Chronic obstructive pulmonary disease, unspecified COPD type ([place]) Clinically stable.  Stop Incruse.  Continue albuterol as needed.  Continue Advair - Fluticasone-Salmeterol (ADVAIR) 100-50 MCG/DOSE AEPB; Inhale 1 puff into the lungs 2 (two) times daily.  Dispense: 180 each; Refill: 3  4. Seasonal allergies Recommend over-the-counter Claritin  5. History of breast cancer Phone number given for patient to call and schedule her mammogram appointment  6. Anxiety Discussed ways to help relieve some of the anxiety associated with COVID-19.  Discussed the importance of good handwashing and social distancing.  Encourage patient to avoid watching the news 24/7.  Encouraged her to get out and walk and to do meditation with deep breathing exercises   Patient was given the opportunity to ask questions.  Patient verbalized understanding of the plan and was able to repeat key elements of the plan.   Orders Placed This Encounter  Procedures  . POCT URINALYSIS DIP (CLINITEK)     Requested Prescriptions    No prescriptions requested or ordered in this encounter    No follow-ups on file.  Karle Plumber, MD, FACP

## 2018-06-29 NOTE — Patient Instructions (Signed)
Please contact [facility] to schedule mammogram at [phone]

## 2018-06-30 ENCOUNTER — Telehealth: Payer: Self-pay | Admitting: Internal Medicine

## 2018-06-30 DIAGNOSIS — E876 Hypokalemia: Secondary | ICD-10-CM

## 2018-06-30 LAB — URINALYSIS
Bilirubin, UA: NEGATIVE
Glucose, UA: NEGATIVE
Ketones, UA: NEGATIVE
Leukocytes,UA: NEGATIVE
Nitrite, UA: NEGATIVE
Protein,UA: NEGATIVE
RBC, UA: NEGATIVE
Specific Gravity, UA: <=1.005 — AB (ref 1.005–1.030)
Urobilinogen, Ur: 0.2 mg/dL (ref 0.2–1.0)
pH, UA: 7 (ref 5.0–7.5)

## 2018-06-30 LAB — COMPREHENSIVE METABOLIC PANEL
ALT: 19 IU/L (ref 0–32)
AST: 20 IU/L (ref 0–40)
Albumin/Globulin Ratio: 1.6 (ref 1.2–2.2)
Albumin: 4.8 g/dL (ref 3.8–4.8)
Alkaline Phosphatase: 67 IU/L (ref 39–117)
BUN/Creatinine Ratio: 13 (ref 12–28)
BUN: 8 mg/dL (ref 8–27)
Bilirubin Total: 0.6 mg/dL (ref 0.0–1.2)
CO2: 25 mmol/L (ref 20–29)
Calcium: 9.9 mg/dL (ref 8.7–10.3)
Chloride: 102 mmol/L (ref 96–106)
Creatinine, Ser: 0.63 mg/dL (ref 0.57–1.00)
GFR calc Af Amer: 112 mL/min/{1.73_m2} (ref >59)
GFR calc non Af Amer: 97 mL/min/{1.73_m2} (ref >59)
Globulin, Total: 3 g/dL (ref 1.5–4.5)
Glucose: 83 mg/dL (ref 65–99)
Potassium: 3.3 mmol/L — ABNORMAL LOW (ref 3.5–5.2)
Sodium: 143 mmol/L (ref 134–144)
Total Protein: 7.8 g/dL (ref 6.0–8.5)

## 2018-06-30 MED ORDER — POTASSIUM CHLORIDE ER 20 MEQ PO TBCR: EXTENDED_RELEASE_TABLET | ORAL | 0 refills | Status: DC

## 2018-06-30 NOTE — Telephone Encounter (Signed)
PC placed to pt this a.m to go over lab results.  Pt told urinalysis was okay and did not show infection or blood.  LFT and kidney function tests were normal.  K+ level was slightly low at 3.3. Pt was on K+ supplement in the past and states she still has a large number of ER Potassium supplement 20 meq at home with expiration date of 10/2018.  I told pt to start taking one tablet of the ER Potassium 20 meq Q Wed and Sat.  Return to lab on Tuesday of next week to have K+ level rechecked.  Pt expressed understanding of the plan and was able to repeat back instructions to me.  She also wrote down the days of the week that she was told to take one tablet.   Potassium 20 meq added to pt's med list. Pt is on Lisinopril and Spironolactone.  She may have hyper-aldosterone

## 2018-07-06 ENCOUNTER — Ambulatory Visit: Payer: Medicare HMO | Attending: Internal Medicine

## 2018-07-06 ENCOUNTER — Other Ambulatory Visit: Payer: Self-pay

## 2018-07-06 DIAGNOSIS — E876 Hypokalemia: Secondary | ICD-10-CM | POA: Diagnosis not present

## 2018-07-07 LAB — POTASSIUM: Potassium: 3.5 mmol/L (ref 3.5–5.2)

## 2018-07-27 ENCOUNTER — Other Ambulatory Visit: Payer: Self-pay | Admitting: Internal Medicine

## 2018-07-27 DIAGNOSIS — Z760 Encounter for issue of repeat prescription: Secondary | ICD-10-CM

## 2018-07-27 DIAGNOSIS — I70219 Atherosclerosis of native arteries of extremities with intermittent claudication, unspecified extremity: Secondary | ICD-10-CM

## 2018-07-29 ENCOUNTER — Other Ambulatory Visit: Payer: Self-pay | Admitting: Pharmacist

## 2018-07-29 DIAGNOSIS — H0102B Squamous blepharitis left eye, upper and lower eyelids: Secondary | ICD-10-CM | POA: Diagnosis not present

## 2018-07-29 DIAGNOSIS — H35033 Hypertensive retinopathy, bilateral: Secondary | ICD-10-CM | POA: Diagnosis not present

## 2018-07-29 DIAGNOSIS — H0102A Squamous blepharitis right eye, upper and lower eyelids: Secondary | ICD-10-CM | POA: Diagnosis not present

## 2018-07-29 DIAGNOSIS — H2513 Age-related nuclear cataract, bilateral: Secondary | ICD-10-CM | POA: Diagnosis not present

## 2018-07-29 DIAGNOSIS — H0288B Meibomian gland dysfunction left eye, upper and lower eyelids: Secondary | ICD-10-CM | POA: Diagnosis not present

## 2018-07-29 DIAGNOSIS — H0288A Meibomian gland dysfunction right eye, upper and lower eyelids: Secondary | ICD-10-CM | POA: Diagnosis not present

## 2018-07-29 DIAGNOSIS — H5203 Hypermetropia, bilateral: Secondary | ICD-10-CM | POA: Diagnosis not present

## 2018-07-29 DIAGNOSIS — H52223 Regular astigmatism, bilateral: Secondary | ICD-10-CM | POA: Diagnosis not present

## 2018-07-29 DIAGNOSIS — H04123 Dry eye syndrome of bilateral lacrimal glands: Secondary | ICD-10-CM | POA: Diagnosis not present

## 2018-07-29 MED ORDER — AMLODIPINE BESYLATE 10 MG PO TABS: 10.0000 mg | ORAL_TABLET | Freq: Every day | ORAL | 0 refills | Status: DC

## 2018-07-29 MED ORDER — CARVEDILOL 6.25 MG PO TABS: 6.2500 mg | ORAL_TABLET | Freq: Two times a day (BID) | ORAL | 0 refills | Status: DC

## 2018-08-04 MED FILL — ATORVASTATIN CALCIUM 40 MG: 40 | 90 days supply | Qty: 90 | Fill #3

## 2018-08-27 ENCOUNTER — Telehealth: Payer: Self-pay | Admitting: Internal Medicine

## 2018-08-27 NOTE — Telephone Encounter (Signed)
Patient called to inform that she tried to schedule an appointment to have her breast exam done, but was advise that her PCP needed to send a referral.  Please advise [phone]   Thank you Emmit Pomfret

## 2018-08-27 NOTE — Telephone Encounter (Signed)
Patient needs breast referral for mammogram placed in order to schedule.

## 2018-09-06 MED FILL — LISINOPRIL 40 MG TABLET: 40 | 90 days supply | Qty: 90 | Fill #1

## 2018-09-08 ENCOUNTER — Encounter: Payer: Self-pay | Admitting: Internal Medicine

## 2018-09-08 DIAGNOSIS — H35039 Hypertensive retinopathy, unspecified eye: Secondary | ICD-10-CM | POA: Insufficient documentation

## 2018-09-28 ENCOUNTER — Ambulatory Visit: Payer: Medicare HMO | Admitting: Internal Medicine

## 2018-10-01 ENCOUNTER — Other Ambulatory Visit: Payer: Self-pay

## 2018-10-01 ENCOUNTER — Ambulatory Visit: Payer: Medicare HMO | Attending: Internal Medicine | Admitting: Internal Medicine

## 2018-10-01 ENCOUNTER — Encounter: Payer: Self-pay | Admitting: Internal Medicine

## 2018-10-01 VITALS — BP 135/81 | HR 54

## 2018-10-01 DIAGNOSIS — R234 Changes in skin texture: Secondary | ICD-10-CM

## 2018-10-01 DIAGNOSIS — I10 Essential (primary) hypertension: Secondary | ICD-10-CM

## 2018-10-01 DIAGNOSIS — M5412 Radiculopathy, cervical region: Secondary | ICD-10-CM

## 2018-10-01 DIAGNOSIS — J449 Chronic obstructive pulmonary disease, unspecified: Secondary | ICD-10-CM

## 2018-10-01 DIAGNOSIS — Z853 Personal history of malignant neoplasm of breast: Secondary | ICD-10-CM

## 2018-10-01 MED ORDER — FLUTICASONE-SALMETEROL 100-50 MCG/DOSE IN AEPB: 1.0000 | INHALATION_SPRAY | Freq: Two times a day (BID) | RESPIRATORY_TRACT | 3 refills | Status: DC

## 2018-10-01 MED ORDER — CARVEDILOL 6.25 MG PO TABS: 6.2500 mg | ORAL_TABLET | Freq: Two times a day (BID) | ORAL | 3 refills | Status: DC

## 2018-10-01 NOTE — Progress Notes (Signed)
Virtual Visit via Telephone Note Due to current restrictions/limitations of in-office visits due to the COVID-19 pandemic, this scheduled clinical appointment was converted to a telehealth visit  I connected with Brandy Stevenson on 10/01/18 at 11:55 a.m EDT by telephone and verified that I am speaking with the correct person using two identifiers. I am in my office.  The patient is at home.  Only the patient and myself participated in this encounter.  I discussed the limitations, risks, security and privacy concerns of performing an evaluation and management service by telephone and the availability of in person appointments. I also discussed with the patient that there may be a patient responsible charge related to this service. The patient expressed understanding and agreed to proceed.  History of Present Illness: Hx of PAD, HTN, HL,formertob dep, LT breast CA (lumpectomy and XRT 2009), GERD, COPD, Trigeminal neuralgia RT, dep/anxiety, OA shoulders.  Last seen 3 mths  HYPERTENSION Currently taking: see medication list Med Adherence: [x]  Yes    []  No Medication side effects: []  Yes    [x]  No Adherence with salt restriction: []  Yes    []  No Home Monitoring?: [x]  Yes - daily    Monitoring Frequency:  Daily Home BP results range:   SBP in 130s, sometimes in 140s/DBP in low 80s.  BP this a.m was 131/81 P 54 SOB? [x]  Yes -due to COPD   []  No Chest Pain?: []  Yes    [x]  No Leg swelling?: []  Yes    [x]  No Headaches?: []  Yes    [x]  No Dizziness? []  Yes    [x]  No Comments:  She feels she has gain several lbs since last visit.  Thinks wgh now is about 170 or more.   COPD:  Can tell difference in breathing if she does a lot of strenuous house work.  Thinks wgh gain may contribute to this Compliant with Advair and Albuterol.  No recent flare   MMG:  When she called to schedule MMG, she told them that she has noticed a few dark patches of skin on RT breast.  She does not feel any masses in the  breast.  Told she will need to have me change MMG to diagnostic.   LT breast has been a little smaller than Rt since lumpectomy.  Trying to get new bra from an association that she has used in past.  Reports chronic neck pain.  Pain in the posterior neck and more so on the right side and sometimes radiates to the right arm.  She takes Tylenol.  She is also on meloxicam but does not feel that it does much.  We had prescribed tramadol for her in the past but patient did not want to remain on that.  She is on gabapentin which she reports worked well for her.  She takes it at night and it decreases the pain so that she is able to sleep.  However she ran out of it 2 weeks ago and just got a new refill from [facility].  She noticed for the past 2 nights when she takes that she wakes up with a headache in the mornings. -Last cervical MRI done in 2017 revealed moderately large central and right-sided disc protrusion at C3-4 with cord flattening and mild spinal stenosis, mild spinal and foraminal stenosis bilaterally C4-6   Outpatient Encounter Medications as of 10/01/2018  Medication Sig  . albuterol (PROVENTIL HFA;VENTOLIN HFA) 108 (90 Base) MCG/ACT inhaler Inhale 2 puffs into the lungs every 6 (six)  hours as needed for wheezing or shortness of breath.  [name] amLODipine (NORVASC) 10 MG tablet Take 1 tablet (10 mg total) by mouth at bedtime.  [name] ammonium lactate (LAC-HYDRIN) 12 % cream Apply topically as needed for dry skin.  [name] aspirin EC 81 MG tablet Take 1 tablet (81 mg total) by mouth daily.  [name] atorvastatin (LIPITOR) 40 MG tablet Take 1 tablet (40 mg total) by mouth daily.  . carvedilol (COREG) 6.25 MG tablet Take 1 tablet (6.25 mg total) by mouth 2 (two) times daily.  . cyclobenzaprine (FLEXERIL) 5 MG tablet TAKE 1 TABLET TWICE DAILY AS NEEDED FOR MUSCLE SPASM(S)  . Fluticasone-Salmeterol (ADVAIR) 100-50 MCG/DOSE AEPB Inhale 1 puff into the lungs 2 (two) times daily.  [name] gabapentin (NEURONTIN) 300 MG capsule TAKE 1  CAPSULE AT BEDTIME  . lisinopril (PRINIVIL,ZESTRIL) 40 MG tablet Take 1 tablet (40 mg total) by mouth daily.  . meloxicam (MOBIC) 15 MG tablet TAKE 1 TABLET EVERY DAY  . omeprazole (PRILOSEC) 40 MG capsule TAKE 1 CAPSULE (40 MG TOTAL) BY MOUTH DAILY.  [name] Potassium Chloride ER 20 MEQ TBCR 1 tab PO Q Wed and Sat  . spironolactone (ALDACTONE) 25 MG tablet Take 1 tablet (25 mg total) by mouth daily.   No facility-administered encounter medications on file as of 10/01/2018.     Observations/Objective: No direct observation done  Lab Results  Component Value Date   WBC 6.6 08/25/2017   HGB 12.2 09/29/2017   HCT 36.0 09/29/2017   MCV 93.6 08/25/2017   PLT 230 08/25/2017   Lab Results  Component Value Date   CHOL 153 03/19/2018   HDL 60 03/19/2018   LDLCALC 74 03/19/2018   TRIG 93 03/19/2018   CHOLHDL 2.6 03/19/2018     Chemistry      Component Value Date/Time   NA 143 06/29/2018 1230   K 3.5 07/06/2018 1034   CL 102 06/29/2018 1230   CO2 25 06/29/2018 1230   BUN 8 06/29/2018 1230   CREATININE 0.63 06/29/2018 1230   CREATININE 0.77 04/17/2016 1104      Component Value Date/Time   CALCIUM 9.9 06/29/2018 1230   ALKPHOS 67 06/29/2018 1230   AST 20 06/29/2018 1230   ALT 19 06/29/2018 1230   BILITOT 0.6 06/29/2018 1230      Assessment and Plan: 1. Essential hypertension Blood pressure not at goal but readings are good for her and considering that she is on several medications.  The only medication where she has room for titration is carvedilol but we cannot do that because pulse already in the 50s.  Advised to continue current medications  2. Chronic obstructive pulmonary disease, unspecified COPD type ([facility]) Continue Advair and albuterol.  Advised to use albuterol before increase activity - Fluticasone-Salmeterol (ADVAIR) 100-50 MCG/DOSE AEPB; Inhale 1 puff into the lungs 2 (two) times daily.  Dispense: 180 each; Refill: 3  3. History of breast cancer 4. Breast skin  changes Mammogram referral submitted for diagnostic mammogram  5. Cervical radiculopathy Advised patient that she can take the Tylenol with meloxicam.  Advised her to give the gabapentin several more days to see if the morning headache resolved.  She can also try taking the gabapentin in the day instead of at night to see if it makes a difference.   Follow Up Instructions: 3 months or as needed   I discussed the assessment and treatment plan with the patient. The patient was provided an opportunity to ask questions and all were answered.  The patient agreed with the plan and demonstrated an understanding of the instructions.   The patient was advised to call back or seek an in-person evaluation if the symptoms worsen or if the condition fails to improve as anticipated.  I provided 17 minutes of non-face-to-face time during this encounter.   Karle Plumber, MD

## 2018-10-12 ENCOUNTER — Telehealth: Payer: Self-pay | Admitting: Internal Medicine

## 2018-10-12 NOTE — Telephone Encounter (Signed)
Spoke to patient regarding concerns- Per patient- 2 hours ago she felt a sharp pain in her head. She takes Tylenol 500 mg qid  for headaches. She has missed a few days without taking her BP medication 3rd and 6th. She took medication this morning.  She takes 4 medications for BP. Her blood pressure was 146/93. Has concerns because normally her DBP number is in the high 80's. Has never seen it in the 90's.   Patient was asked about her diet- Sodium intake.  She admits that she has not been a good judge when it comes to the foods - cheese puff, sausage, Oddles and Noodles- bowl because it has less sodium. Provided education for diet.   Advised patient that goal for BP is <130/80. Informed that in the office we would give medication for blood pressure in 160-190 or higher. Denies numbness and tingling in face, hands, or arms, gait imbalance, slurred speech, confusion, blurred vision or dizziness, or worsening pain in head. Patient advised to go to the ED or call 911 if sx's present. Patient repeated and verbalized understanding.   Last OV 10/01/2018 for essential HTN Will forward to PCP. Please advise.

## 2018-10-12 NOTE — Telephone Encounter (Signed)
New Message   Pt states she is having pain on the left side of her head and bp of 146/93

## 2018-10-12 NOTE — Telephone Encounter (Signed)
Brandy Stevenson, I still am not sure why these messages clearly marked for others are routed to me. If you see Sam or another computer this week or next let them know so that perhaps they can figure out what is going on.

## 2018-10-13 NOTE — Telephone Encounter (Signed)
Today BP was 144/91  P- 60BP-146/89 59 Consistent readings of SBP- 140's and DBP's between 80-90   Patient states she looked up breathing techniques to use prior to taking blood pressure and used them before checking her BP.

## 2018-11-02 ENCOUNTER — Telehealth: Payer: Self-pay | Admitting: Internal Medicine

## 2018-11-03 ENCOUNTER — Other Ambulatory Visit: Payer: Self-pay | Admitting: Cardiology

## 2018-11-03 MED FILL — SPIRONOLACTONE 25 MG TABLET: 25 | 90 days supply | Qty: 90 | Fill #2

## 2018-11-05 ENCOUNTER — Telehealth: Payer: Self-pay | Admitting: Internal Medicine

## 2018-11-05 MED ORDER — AMLODIPINE BESYLATE 10 MG PO TABS: 10.0000 mg | ORAL_TABLET | Freq: Every day | ORAL | 0 refills | Status: DC

## 2018-11-05 NOTE — Telephone Encounter (Signed)
Liptor has already been sent to the pharmacy on 11/03/18  Lurena Joiner may you fill norvasc if appropriate

## 2018-11-05 NOTE — Telephone Encounter (Signed)
Patient called requesting  atorvastatin (LIPITOR) 40 MG tablet   & Amlodipine  10mg  Patient use [facility]  . Please, let her know if is going to  Pick it up today

## 2018-11-09 ENCOUNTER — Other Ambulatory Visit: Payer: Self-pay | Admitting: Cardiology

## 2018-11-09 MED FILL — ATORVASTATIN CALCIUM 40 MG: 40 | 30 days supply | Qty: 30 | Fill #0

## 2018-11-10 ENCOUNTER — Telehealth: Payer: Self-pay | Admitting: Internal Medicine

## 2018-11-10 NOTE — Telephone Encounter (Addendum)
1) Medication(s) Requested (by name): amlodipine 2) Pharmacy of Choice: [facility]   Does not use/have [facility] anymore

## 2018-11-11 NOTE — Telephone Encounter (Signed)
There was a rx already sent on 11/05/18 for a 90 day supply

## 2018-11-11 NOTE — Telephone Encounter (Signed)
Patient states she has signed up for the [employer] med delivery service and that her prescription needs to be sent over.   Provided the fax - [phone] Provider line - [phone]

## 2018-11-12 MED ORDER — AMLODIPINE BESYLATE 10 MG PO TABS: 10.0000 mg | ORAL_TABLET | Freq: Every day | ORAL | 0 refills | Status: DC

## 2018-11-12 NOTE — Telephone Encounter (Signed)
Lurena Joiner can you resend rx to [facility] order please

## 2018-11-15 ENCOUNTER — Other Ambulatory Visit: Payer: Self-pay

## 2018-12-07 ENCOUNTER — Other Ambulatory Visit: Payer: Self-pay

## 2018-12-07 DIAGNOSIS — I10 Essential (primary) hypertension: Secondary | ICD-10-CM

## 2018-12-07 DIAGNOSIS — Z760 Encounter for issue of repeat prescription: Secondary | ICD-10-CM

## 2018-12-07 MED ORDER — OMEPRAZOLE 40 MG PO CPDR: 40.0000 mg | DELAYED_RELEASE_CAPSULE | Freq: Every day | ORAL | 1 refills | Status: DC

## 2018-12-07 MED ORDER — ATORVASTATIN CALCIUM 40 MG PO TABS: 40.0000 mg | ORAL_TABLET | Freq: Every day | ORAL | 1 refills | Status: DC

## 2018-12-07 MED ORDER — MELOXICAM 15 MG PO TABS: 15.0000 mg | ORAL_TABLET | Freq: Every day | ORAL | 1 refills | Status: DC

## 2018-12-07 MED ORDER — LISINOPRIL 40 MG PO TABS: 40.0000 mg | ORAL_TABLET | Freq: Every day | ORAL | 1 refills | Status: DC

## 2018-12-07 NOTE — Telephone Encounter (Signed)
Received refill request from [facility] via fax for [facility] patient.

## 2018-12-27 ENCOUNTER — Encounter: Payer: Self-pay | Admitting: Internal Medicine

## 2018-12-27 ENCOUNTER — Other Ambulatory Visit: Payer: Self-pay

## 2018-12-27 ENCOUNTER — Ambulatory Visit: Payer: Medicare Other | Attending: Internal Medicine | Admitting: Internal Medicine

## 2018-12-27 VITALS — BP 165/92 | HR 60 | Temp 98.1°F | Resp 16 | Wt 186.8 lb

## 2018-12-27 DIAGNOSIS — Z01818 Encounter for other preprocedural examination: Secondary | ICD-10-CM | POA: Diagnosis not present

## 2018-12-27 DIAGNOSIS — K089 Disorder of teeth and supporting structures, unspecified: Secondary | ICD-10-CM

## 2018-12-27 DIAGNOSIS — I10 Essential (primary) hypertension: Secondary | ICD-10-CM | POA: Diagnosis not present

## 2018-12-27 DIAGNOSIS — Z86010 Personal history of colonic polyps: Secondary | ICD-10-CM

## 2018-12-27 DIAGNOSIS — J449 Chronic obstructive pulmonary disease, unspecified: Secondary | ICD-10-CM

## 2018-12-27 DIAGNOSIS — Z2821 Immunization not carried out because of patient refusal: Secondary | ICD-10-CM

## 2018-12-27 MED ORDER — AMOXICILLIN 500 MG PO CAPS: 500.0000 mg | ORAL_CAPSULE | Freq: Three times a day (TID) | ORAL | 0 refills | Status: DC

## 2018-12-27 NOTE — Progress Notes (Signed)
Patient ID: Brandy Stevenson, female    DOB: [birth year]  MRN: [id number]  CC: Medical Clearance (Surgery )   Subjective: Brandy Stevenson is a 63 y.o. female who presents for pre-op eval. Her concerns today include:  Hx of PAD, HTN, HL,formertob dep, LT breast CA (lumpectomy and XRT 2009), GERD, COPD, Trigeminal neuralgia RT, dep/anxiety, OA shoulders.  Patient scheduled to have dental procedure to have the rest of her teeth removed and bones filed down if needed.  This will be done under sedation in the dental office.  No problems with anesthesia in past.  Reports some gum swelling and pain around teeth in lower jaw RT side over past several days.  Thinks she may need abx. Can walk 3-4 blocks at her own pace without CP but some SOB which she relates to COPD.  Not getting in much exercise Checks BP almost daily with upper arm device.  Did not bring log book.  Reports most recent readings were:  125/79, 117/81, 135/84 (this was the highest reading in the past wk). Gained 16 lbs since March.  Use to have a friend to walk with but friend relocated.  Afraid to walk alone. Admits to eating more junk foods like cake and ice cream.   Feels she is doing okay with COPD on current inhalers.  Using Advair BID and Albuterol 2-3 x a day.   Patient Active Problem List   Diagnosis Date Noted  . Cervical radiculopathy 10/01/2018  . Hypertensive retinopathy 09/08/2018  . Seasonal allergies 06/29/2018  . Callous ulcer ([place]) 03/16/2018  . Aortoiliac occlusive disease ([place]) 11/04/2017  . DOE (dyspnea on exertion) 11/03/2017  . Atypical chest pain 11/03/2017  . Ichthyosis 10/06/2017  . Former smoker 04/07/2017  . Primary osteoarthritis of right shoulder 04/07/2017  . Chronic midline low back pain without sciatica 04/07/2017  . Hiatal hernia 05/01/2016  . DJD (degenerative joint disease) of cervical spine 04/17/2016  . History of breast cancer 02/18/2016  . Foot callus 02/18/2016  . Anxiety and  depression 05/22/2015  . Insomnia 03/15/2015  . Unspecified vitamin D deficiency 04/08/2013  . Hyperlipidemia with target LDL less than 70 04/08/2013  . Atherosclerotic PVD with intermittent claudication ([place]) 03/22/2013  . Accelerated hypertension 02/23/2013  . GERD (gastroesophageal reflux disease) 02/23/2013  . COPD (chronic obstructive pulmonary disease) ([place]) 02/23/2013     Current Outpatient Medications on File Prior to Visit  Medication Sig Dispense Refill  . albuterol (PROVENTIL HFA;VENTOLIN HFA) 108 (90 Base) MCG/ACT inhaler Inhale 2 puffs into the lungs every 6 (six) hours as needed for wheezing or shortness of breath. 1 Inhaler 2  . amLODipine (NORVASC) 10 MG tablet Take 1 tablet (10 mg total) by mouth at bedtime. 90 tablet 0  . ammonium lactate (LAC-HYDRIN) 12 % cream Apply topically as needed for dry skin. 385 g 0  . aspirin EC 81 MG tablet Take 1 tablet (81 mg total) by mouth daily. 90 tablet 3  . atorvastatin (LIPITOR) 40 MG tablet Take 1 tablet (40 mg total) by mouth daily. 90 tablet 1  . carvedilol (COREG) 6.25 MG tablet Take 1 tablet (6.25 mg total) by mouth 2 (two) times daily. 180 tablet 3  . cyclobenzaprine (FLEXERIL) 5 MG tablet TAKE 1 TABLET TWICE DAILY AS NEEDED FOR MUSCLE SPASM(S) 30 tablet 0  . Fluticasone-Salmeterol (ADVAIR) 100-50 MCG/DOSE AEPB Inhale 1 puff into the lungs 2 (two) times daily. 180 each 3  . gabapentin (NEURONTIN) 300 MG capsule TAKE 1 CAPSULE AT BEDTIME  90 capsule 2  . lisinopril (ZESTRIL) 40 MG tablet Take 1 tablet (40 mg total) by mouth daily. 90 tablet 1  . meloxicam (MOBIC) 15 MG tablet Take 1 tablet (15 mg total) by mouth daily. 90 tablet 1  . omeprazole (PRILOSEC) 40 MG capsule Take 1 capsule (40 mg total) by mouth daily. 90 capsule 1  . Potassium Chloride ER 20 MEQ TBCR 1 tab PO Q Wed and Sat 90 tablet 0  . spironolactone (ALDACTONE) 25 MG tablet Take 1 tablet (25 mg total) by mouth daily. 90 tablet 3   No current facility-administered  medications on file prior to visit.     Allergies  Allergen Reactions  . Vicodin [Hydrocodone-Acetaminophen] Nausea And Vomiting and Other (See Comments)    Sweating  . Bactrim [Sulfamethoxazole-Trimethoprim]     HA and vomiting  . Incruse Ellipta [Umeclidinium Bromide]     Dry mouth  . Wellbutrin [Bupropion]     Made me feel bad    Social History   Socioeconomic History  . Marital status: Married    Spouse name: Not on file  . Number of children: Not on file  . Years of education: Not on file  . Highest education level: Not on file  Occupational History  . Not on file  Social Needs  . Financial resource strain: Not on file  . Food insecurity    Worry: Not on file    Inability: Not on file  . Transportation needs    Medical: Not on file    Non-medical: Not on file  Tobacco Use  . Smoking status: Former Smoker    Packs/day: 0.25    Years: 40.00    Pack years: 10.00    Types: Cigarettes    Quit date: 02/08/2017    Years since quitting: 1.8  . Smokeless tobacco: Never Used  . Tobacco comment: 3 cigarettes per day  Substance and Sexual Activity  . Alcohol use: No  . Drug use: No  . Sexual activity: Not Currently  Lifestyle  . Physical activity    Days per week: Not on file    Minutes per session: Not on file  . Stress: Not on file  Relationships  . Social Herbalist on phone: Not on file    Gets together: Not on file    Attends religious service: Not on file    Active member of club or organization: Not on file    Attends meetings of clubs or organizations: Not on file    Relationship status: Not on file  . Intimate partner violence    Fear of current or ex partner: Not on file    Emotionally abused: Not on file    Physically abused: Not on file    Forced sexual activity: Not on file  Other Topics Concern  . Not on file  Social History Narrative  . Not on file    Family History  Problem Relation Age of Onset  . Hyperlipidemia Mother   .  Hypertension Mother   . Heart disease Mother        Atrial Fib.  . Hyperlipidemia Sister   . Hypertension Sister   . Heart disease Sister        Before age 53,  CHF  . Heart attack Brother   . Hyperlipidemia Brother   . Hypertension Brother   . Heart disease Brother   . Hypertension Daughter     Past Surgical History:  Procedure Laterality Date  .  APPENDECTOMY  1980  . BREAST LUMPECTOMY Left    s/p radiation therapy   . BREAST SURGERY    . COLONOSCOPY  07/2015  . COLONOSCOPY WITH PROPOFOL N/A 06/26/2015   Procedure: COLONOSCOPY WITH PROPOFOL;  Surgeon: [name], MD;  Location: [facility];  Service: Endoscopy;  Laterality: N/A;  . CT CTA CORONARY W/CA SCORE W/CM &/OR WO/CM  12/10/2017   Coronary calcium score is 0.  No evidence of CAD.  [name] PERIPHERAL VASCULAR CATHETERIZATION N/A 10/22/2015   Procedure: Abdominal Aortogram w/Lower Extremity;  Surgeon: [name], MD;  Location: [facility] INVASIVE CV LAB: Aortic arch widely patent as far as innominate artery, right subclavian artery, right vertebral artery and right, common carotid.  Bilateral single renal arteries - Nonocclusive disease. Infrarenal aorta 100% - bilateral common Iliac 100% --> B Ex Iliacs fill via Int Iliac. Leg OK  . TRANSTHORACIC ECHOCARDIOGRAM  08/2017   EF 60-65%. No RWMA.  Normal DF.  Normal valves.   . TUBAL LIGATION  1991    ROS: Review of Systems Negative except as stated above  PHYSICAL EXAM: BP (!) 165/92   Pulse 60   Temp 98.1 F (36.7 C) (Oral)   Resp 16   Wt 186 lb 12.8 oz (84.7 kg)   SpO2 98%   BMI 32.06 kg/m   Wt Readings from Last 3 Encounters:  12/27/18 186 lb 12.8 oz (84.7 kg)  06/29/18 167 lb 12.8 oz (76.1 kg)  05/05/18 170 lb 8.4 oz (77.3 kg)    Physical Exam  General appearance - alert, well appearing, and in no distress Mental status - normal mood, behavior, speech, dress, motor activity, and thought processes Eyes - pupils equal and reactive, extraocular eye  movements intact Nose - normal and patent, no erythema, discharge or polyps Mouth - mucous membranes moist, pharynx normal without lesions.  She has swelling redness and some tenderness around the incisors RT lower jaw Neck - supple, no significant adenopathy Chest - clear to auscultation, no wheezes, rales or rhonchi, symmetric air entry Heart - normal rate, regular rhythm, normal S1, S2, no murmurs, rubs, clicks or gallops Extremities - peripheral pulses normal, no pedal edema, no clubbing or cyanosis   CMP Latest Ref Rng & Units 07/06/2018 06/29/2018 03/19/2018  Glucose 65 - 99 mg/dL - 83 -  BUN 8 - 27 mg/dL - 8 -  Creatinine 0.57 - 1.00 mg/dL - 0.63 -  Sodium 134 - 144 mmol/L - 143 -  Potassium 3.5 - 5.2 mmol/L 3.5 3.3(L) -  Chloride 96 - 106 mmol/L - 102 -  CO2 20 - 29 mmol/L - 25 -  Calcium 8.7 - 10.3 mg/dL - 9.9 -  Total Protein 6.0 - 8.5 g/dL - 7.8 7.6  Total Bilirubin 0.0 - 1.2 mg/dL - 0.6 0.4  Alkaline Phos 39 - 117 IU/L - 67 81  AST 0 - 40 IU/L - 20 23  ALT 0 - 32 IU/L - 19 27   Lipid Panel     Component Value Date/Time   CHOL 153 03/19/2018 1024   TRIG 93 03/19/2018 1024   HDL 60 03/19/2018 1024   CHOLHDL 2.6 03/19/2018 1024   CHOLHDL 2.8 07/27/2014 1207   VLDL 32 07/27/2014 1207   LDLCALC 74 03/19/2018 1024    CBC    Component Value Date/Time   WBC 6.6 08/25/2017 0118   RBC 4.22 08/25/2017 0118   HGB 12.2 09/29/2017 0526   HGB 11.4 07/06/2017 1046   HGB 13.8  06/12/2008 0939   HCT 36.0 09/29/2017 0526   HCT 33.2 (L) 07/06/2017 1046   HCT 40.2 06/12/2008 0939   PLT 230 08/25/2017 0118   PLT 191 07/06/2017 1046   MCV 93.6 08/25/2017 0118   MCV 91 07/06/2017 1046   MCV 90.1 06/12/2008 0939   MCH 30.3 08/25/2017 0118   MCHC 32.4 08/25/2017 0118   RDW 13.8 08/25/2017 0118   RDW 14.0 07/06/2017 1046   RDW 13.9 06/12/2008 0939   LYMPHSABS 0.4 (L) 03/10/2017 1335   LYMPHSABS 1.5 06/12/2008 0939   MONOABS 0.5 03/23/2016 2308   MONOABS 0.6 06/12/2008 0939    EOSABS 0.1 03/10/2017 1335   BASOSABS 0.0 03/10/2017 1335   BASOSABS 0.0 06/12/2008 0939    ASSESSMENT AND PLAN: 1. Preoperative evaluation to rule out surgical contraindication 2. Dental disorder This is a low risk procedure.  Normal blood pressure readings are okay.  She may have a component of whitecoat hypertension.  However before approving her for the procedure, I have requested that she follow-up with the clinical pharmacist in 1 week and bring her blood pressure monitoring device with her so that blood pressure can be checked on her device and ours to make sure that her home blood pressure device is accurate.  She will continue her current blood pressure medications.  Once the clinical pharmacist sees her and sends me a message to let me know her blood pressure reading, we can move forward with submitting her release form - amoxicillin (AMOXIL) 500 MG capsule; Take 1 capsule (500 mg total) by mouth 3 (three) times daily.  Dispense: 21 capsule; Refill: 0  3. Essential hypertension See #2 above  4. Chronic obstructive pulmonary disease, unspecified COPD type (HCC) Stable on current inhalers  5. History of colon polyps She is overdue for colonoscopy.  She is agreeable for referral - Ambulatory referral to Gastroenterology  6. Influenza vaccination declined   Keep appt next mth for pap Patient was given the opportunity to ask questions.  Patient verbalized understanding of the plan and was able to repeat key elements of the plan.   Orders Placed This Encounter  Procedures  . Ambulatory referral to Gastroenterology     Requested Prescriptions   Signed Prescriptions Disp Refills  . amoxicillin (AMOXIL) 500 MG capsule 21 capsule 0    Sig: Take 1 capsule (500 mg total) by mouth 3 (three) times daily.    No follow-ups on file.  Karle Plumber, MD, FACP

## 2018-12-27 NOTE — Patient Instructions (Signed)
Please give patient an appointment with the clinical pharmacist in 1 week for repeat blood pressure check.  Please continue to monitor your blood pressure at home and bring in your blood pressure log in 1 week to see the clinical pharmacist.  Please bring your blood pressure monitoring device with you.  I have sent a prescription for some antibiotics to your pharmacy.

## 2019-01-04 ENCOUNTER — Other Ambulatory Visit: Payer: Self-pay

## 2019-01-04 ENCOUNTER — Ambulatory Visit: Payer: Medicare HMO | Attending: Internal Medicine | Admitting: Pharmacist

## 2019-01-04 ENCOUNTER — Encounter: Payer: Self-pay | Admitting: Pharmacist

## 2019-01-04 VITALS — BP 147/79 | HR 56

## 2019-01-04 DIAGNOSIS — Z0131 Encounter for examination of blood pressure with abnormal findings: Secondary | ICD-10-CM

## 2019-01-04 DIAGNOSIS — I10 Essential (primary) hypertension: Secondary | ICD-10-CM

## 2019-01-04 NOTE — Progress Notes (Signed)
   S:    Patient arrives in good spirits. Presents to the clinic for BP check. Patient was referred and last seen by Primary Care Provider on 12/27/2018 - BP was 165/92.     Patient reports adherence with medications.  Current BP Medications include:  Amlodipine 10 mg daily, lisinopril 40 mg daily,  Carvedilol 6.25 mg BID, spironolactone 25 mg daily  Antihypertensives tried in the past include: Zestoretic (stopped d/t hypokalemia)  Dietary habits include: still not making changes; admitted to Dr. Wynetta Emery dietary non-compliance  Exercise habits include: recently stopped walking  Family / Social history:  - FHx: HD,HTN, HLD (mother, both brothers); MI (brother) - Former smoker (quit 2 years ago) - Alcohol: denies use  O:   Home BP readings:  - Brings BP log from home - Majority of readings 120s-130s/70s -Only 3 readings since 12/01/18 are in the 140s-150s  Last 3 Office BP readings: BP Readings from Last 3 Encounters:  01/04/19 (!) 147/79  12/27/18 (!) 165/92  10/01/18 135/81   BMET    Component Value Date/Time   NA 143 06/29/2018 1230   K 3.5 07/06/2018 1034   CL 102 06/29/2018 1230   CO2 25 06/29/2018 1230   GLUCOSE 83 06/29/2018 1230   GLUCOSE 94 09/29/2017 0526   BUN 8 06/29/2018 1230   CREATININE 0.63 06/29/2018 1230   CREATININE 0.77 04/17/2016 1104   CALCIUM 9.9 06/29/2018 1230   GFRNONAA 97 06/29/2018 1230   GFRNONAA 85 04/17/2016 1104   GFRAA 112 06/29/2018 1230   GFRAA >89 04/17/2016 1104    Renal function: CrCl cannot be calculated (Patient's most recent lab result is older than the maximum 21 days allowed.).  Clinical ASCVD: Yes - atherosclerotic PVD  A/P: Hypertension longstanding currently above goal on current medications. BP Goal = <130/80 mmHg. Patient reports adherence to medications. I've reviewed her BP readings from home and most of these are close to or at goal. Today's BP is improved from last week's visit with Dr. Wynetta Emery. I will message her  to make her aware.   -Continued current regimen.  -Counseled on lifestyle modifications for blood pressure control including reduced dietary sodium, increased exercise, adequate sleep  Results reviewed and written information provided.   Total time in face-to-face counseling 15 minutes.   F/U Clinic Visit with PCP.   Patient seen with:  Lesle Reek, PharmD Candidate [employer]  Benard Halsted, PharmD, [place] [phone]

## 2019-01-04 NOTE — Patient Instructions (Signed)
Thank you for coming to see us today.   Blood pressure today is improving.  Continue taking blood pressure medications as prescribed.   Limiting salt and caffeine, as well as exercising as able for at least 30 minutes for 5 days out of the week, can also help you lower your blood pressure.  Take your blood pressure at home if you are able. Please write down these numbers and bring them to your visits.  If you have any questions about medications, please call me [phone].  Brandy Stevenson  

## 2019-01-05 ENCOUNTER — Telehealth: Payer: Self-pay | Admitting: Internal Medicine

## 2019-01-05 NOTE — Telephone Encounter (Signed)
-----   Message from Tresa Endo, RPH-CPP sent at 01/04/2019 11:59 AM EST ----- Regarding: BP result from today Dr. Wynetta Emery -   Saw Ms. Brandy Stevenson today and her BP was improved at 147/79 (down from 165/92 last week). I also noted that she brought her BP log from home. Most SBPs were in the 120s or 130s. DBPs were in the 80s. She has 3 SBP readings since 12/01/18 over 140 with the highest being 155.

## 2019-01-05 NOTE — Telephone Encounter (Signed)
I have reviewed clinical pharmacist message.  Repeat BP better.  I will sign her form and fax to Dr. Diona Browner, D.M.D let him know that pt is cleared medically for dental procedure.

## 2019-01-07 NOTE — Telephone Encounter (Signed)
Contacted pt and made aware of Dr. Wynetta Emery message

## 2019-01-10 ENCOUNTER — Other Ambulatory Visit: Payer: Self-pay | Admitting: Internal Medicine

## 2019-01-12 ENCOUNTER — Other Ambulatory Visit: Payer: Self-pay | Admitting: Internal Medicine

## 2019-01-12 DIAGNOSIS — Z1231 Encounter for screening mammogram for malignant neoplasm of breast: Secondary | ICD-10-CM

## 2019-01-18 ENCOUNTER — Ambulatory Visit (INDEPENDENT_AMBULATORY_CARE_PROVIDER_SITE_OTHER): Payer: Medicare HMO | Admitting: Cardiology

## 2019-01-18 ENCOUNTER — Encounter: Payer: Self-pay | Admitting: Cardiology

## 2019-01-18 ENCOUNTER — Other Ambulatory Visit: Payer: Self-pay

## 2019-01-18 VITALS — BP 168/90 | HR 54 | Temp 96.8°F | Ht 65.0 in | Wt 189.4 lb

## 2019-01-18 DIAGNOSIS — E785 Hyperlipidemia, unspecified: Secondary | ICD-10-CM

## 2019-01-18 DIAGNOSIS — I739 Peripheral vascular disease, unspecified: Secondary | ICD-10-CM

## 2019-01-18 DIAGNOSIS — I10 Essential (primary) hypertension: Secondary | ICD-10-CM | POA: Diagnosis not present

## 2019-01-18 DIAGNOSIS — Z01810 Encounter for preprocedural cardiovascular examination: Secondary | ICD-10-CM

## 2019-01-18 DIAGNOSIS — I7409 Other arterial embolism and thrombosis of abdominal aorta: Secondary | ICD-10-CM | POA: Diagnosis not present

## 2019-01-18 NOTE — Progress Notes (Signed)
Primary Care Provider: [name], MD Cardiologist: Brandy Hew, MD Electrophysiologist:   Clinic Note: Chief Complaint  Patient presents with  . Follow-up    Annual  . Hypertension    Labile  . Pre-op Exam    Dental procedure    HPI:    Brandy Stevenson is a 63 y.o. female with a PMH notable for poorly controlled HTN, PAD (Leriche Syndrome-being treated medically), HLD, COPD (former heavy smoker) & h/o Br Ca (s/p lumpectomy) below who presents today for annual f/u.  Brandy Stevenson was last seen by me back in November 2019.  This was a follow-up after coronary CT angiogram showing no evidence of CAD and a coronary calcium score of 0 which is interesting in a patient with Leriche syndrome.  Blood pressures then were ranging from the 130/80-1 140/90 mmHg range.  Recent Hospitalizations: None  Reviewed  CV studies:    The following studies were reviewed today: (if available, images/films reviewed: From Epic Chart or Care Everywhere) . Lower extremity arterial Dopplers-ABIs (May 05, 2018): Unchanged ABIs indicating moderate bilateral disease.   Interval History:   Brandy Stevenson presents here today for cardiology evaluation presumably because of higher blood pressure.  She tells me that for the most part her pressure is that she can tell of been ranging from the systolic pressures of [id number].  Her pressure pressure at home today was 140/89 mmHg.  Medications have been adjusted by her PCP and there have clinical pharmacist at the [facility]  Pretty much worse now that she has quite labile blood pressure.  She apparently quit smoking and gained quite a bit of weight.  Basely with the COVID-19 lockdown, she has been less active and tends to "graze and snack between meals.  Notes mild end of day swelling.  She is complaining about off-and-on headaches.  Also has arthritis pains in her neck and back.  She has been taking ibuprofen but that was stopped  because of blood pressure she tried meloxicam that did not help now she is in Tylenol still having lots of away pain.  Despite having elevated pressures not really having any symptoms whatsoever of headaches or blurred vision social depression unless her pressures are very high.  No chest pain or pressure, no dyspnea.  CV Review of Symptoms (Summary)  positive for - Maybe mild exertional dyspnea if she pushes it hard, but has been out of shape.  Gained weight.  Mild swelling. negative for - chest pain, irregular heartbeat, orthopnea, palpitations, paroxysmal nocturnal dyspnea, rapid heart rate, shortness of breath or Syncope/near syncope TIA/amaurosis fugax  The patient does not have symptoms concerning for COVID-19 infection (fever, chills, cough, or new shortness of breath).  The patient is practicing social distancing. ++ Masking.  Rarely goes out for groceries/shopping.   REVIEWED OF SYSTEMS   A comprehensive ROS was performed. Review of Systems  Constitutional: Positive for malaise/fatigue (Feels lazy with all these restrictions). Negative for weight loss (Weight gain, see above).  HENT: Negative for congestion and nosebleeds.   Respiratory: Negative for cough, shortness of breath and wheezing.   Cardiovascular: Positive for claudication (Mild stable) and leg swelling.  Gastrointestinal: Negative for blood in stool and melena.  Genitourinary: Negative for hematuria.  Musculoskeletal: Positive for back pain, joint pain (Back, hips) and neck pain. Negative for falls.  Neurological: Positive for headaches. Negative for dizziness and tingling (Some with back and neck pain).  Psychiatric/Behavioral: Negative for depression and memory loss. The patient is  not nervous/anxious and does not have insomnia.    I have reviewed and (if needed) personally updated the patient's problem list, medications, allergies, past medical and surgical history, social and family history.   PAST MEDICAL HISTORY    Past Medical History:  Diagnosis Date  . Arthritis    back problems to be evaluated by neurology next month  . Cancer ([place]) 2009   L, Breast, radiation and surgery.  [name] COPD (chronic obstructive pulmonary disease) ([place])   . Depression   . GERD (gastroesophageal reflux disease)   . Hyperlipemia   . Hypertension   . Leriche syndrome ([place])    Infrarenal aortic occlusion extending to bilateral internal iliac arteries.  External iliacs fill via collaterals from lumbar arteries to hypogastric/internal iliac arteries -minimal disease on the right side and no disease on the left side leg arteries distally.  . Peripheral vascular disease ([place]) 2013     PAST SURGICAL HISTORY   Past Surgical History:  Procedure Laterality Date  . APPENDECTOMY  1980  . BREAST LUMPECTOMY Left    s/p radiation therapy   . BREAST SURGERY    . COLONOSCOPY  07/2015  . COLONOSCOPY WITH PROPOFOL N/A 06/26/2015   Procedure: COLONOSCOPY WITH PROPOFOL;  Surgeon: Brandy Fair, MD;  Location: [facility];  Service: Endoscopy;  Laterality: N/A;  . CT CTA CORONARY W/CA SCORE W/CM &/OR WO/CM  12/10/2017   Coronary calcium score is 0.  No evidence of CAD.  [name] PERIPHERAL VASCULAR CATHETERIZATION N/A 10/22/2015   Procedure: Abdominal Aortogram w/Lower Extremity;  Surgeon: Brandy Mould, MD;  Location: [facility] INVASIVE CV LAB: Aortic arch widely patent as far as innominate artery, right subclavian artery, right vertebral artery and right, common carotid.  Bilateral single renal arteries - Nonocclusive disease. Infrarenal aorta 100% - bilateral common Iliac 100% --> B Ex Iliacs fill via Int Iliac. Leg OK  . TRANSTHORACIC ECHOCARDIOGRAM  08/2017   EF 60-65%. No RWMA.  Normal DF.  Normal valves.   . TUBAL LIGATION  1991     MEDICATIONS/ALLERGIES   Current Meds  Medication Sig  . albuterol (PROVENTIL HFA;VENTOLIN HFA) 108 (90 Base) MCG/ACT inhaler Inhale 2 puffs into the lungs every 6 (six) hours as needed for wheezing  or shortness of breath.  [name] amLODipine (NORVASC) 10 MG tablet TAKE 1 TABLET BY MOUTH AT  BEDTIME  . ammonium lactate (LAC-HYDRIN) 12 % cream Apply topically as needed for dry skin.  [name] amoxicillin (AMOXIL) 500 MG capsule Take 1 capsule (500 mg total) by mouth 3 (three) times daily.  [name] aspirin EC 81 MG tablet Take 1 tablet (81 mg total) by mouth daily.  [name] atorvastatin (LIPITOR) 40 MG tablet Take 1 tablet (40 mg total) by mouth daily.  . carvedilol (COREG) 6.25 MG tablet Take 1 tablet (6.25 mg total) by mouth 2 (two) times daily.  . cyclobenzaprine (FLEXERIL) 5 MG tablet TAKE 1 TABLET TWICE DAILY AS NEEDED FOR MUSCLE SPASM(S)  . Fluticasone-Salmeterol (ADVAIR) 100-50 MCG/DOSE AEPB Inhale 1 puff into the lungs 2 (two) times daily.  [name] gabapentin (NEURONTIN) 300 MG capsule TAKE 1 CAPSULE AT BEDTIME  . lisinopril (ZESTRIL) 40 MG tablet Take 1 tablet (40 mg total) by mouth daily.  . meloxicam (MOBIC) 15 MG tablet Take 1 tablet (15 mg total) by mouth daily.  [name] omeprazole (PRILOSEC) 40 MG capsule Take 1 capsule (40 mg total) by mouth daily.  . Potassium Chloride ER 20 MEQ TBCR 1 tab PO Q Wed and Sat  .  spironolactone (ALDACTONE) 25 MG tablet Take 1 tablet (25 mg total) by mouth daily.    Allergies  Allergen Reactions  . Vicodin [Hydrocodone-Acetaminophen] Nausea And Vomiting and Other (See Comments)    Sweating  . Bactrim [Sulfamethoxazole-Trimethoprim]     HA and vomiting  . Incruse Ellipta [Umeclidinium Bromide]     Dry mouth  . Wellbutrin [Bupropion]     Made me feel bad    SOCIAL HISTORY/FAMILY HISTORY   Social History   Tobacco Use  . Smoking status: Former Smoker    Packs/day: 0.25    Years: 40.00    Pack years: 10.00    Types: Cigarettes    Quit date: 02/08/2017    Years since quitting: 1.9  . Smokeless tobacco: Never Used  . Tobacco comment: 3 cigarettes per day  Substance Use Topics  . Alcohol use: No  . Drug use: No  - finally quit smoking - but gained lots of wgt.   Has  adjusted diet, but not getting enough exercise.  (Her daughter moved, so not close enoguh to walk with her).   Social History   Social History Narrative  . Not on file    Family History family history includes Heart attack in her brother; Heart disease in her brother, mother, and sister; Hyperlipidemia in her brother, mother, and sister; Hypertension in her brother, daughter, mother, and sister.   OBJCTIVE -PE, EKG, labs   Wt Readings from Last 3 Encounters:  01/18/19 189 lb 6.4 oz (85.9 kg)  12/27/18 186 lb 12.8 oz (84.7 kg)  06/29/18 167 lb 12.8 oz (76.1 kg)    Physical Exam: BP (!) 168/90   Pulse (!) 54   Temp (!) 96.8 F (36 C)   Ht 5\' 5"  (1.651 m)   Wt 189 lb 6.4 oz (85.9 kg)   SpO2 98%   BMI 31.52 kg/m  --> Had not yet taken her blood pressure medication today. Physical Exam  Constitutional: She is oriented to person, place, and time. She appears well-developed and well-nourished. No distress.  Healthy healthy-appearing.  Well-groomed.  HENT:  Head: Normocephalic and atraumatic.  Neck: Normal range of motion. Neck supple. No hepatojugular reflux and no JVD present. Carotid bruit is present (Soft bilateral carotid bruits).  Cardiovascular: Regular rhythm, S1 normal and S2 normal.  Occasional extrasystoles are present. Bradycardia present. PMI is not displaced. Exam reveals distant heart sounds and decreased pulses (Decreased bilateral pedal pulses). Exam reveals no gallop and no friction rub.  No murmur heard. Pulmonary/Chest: Effort normal and breath sounds normal. No respiratory distress. She has no wheezes. She exhibits no tenderness.  Abdominal: Soft. Bowel sounds are normal. She exhibits no distension. There is no abdominal tenderness. There is no rebound.  Musculoskeletal: Normal range of motion.        General: No edema.  Neurological: She is alert and oriented to person, place, and time.  Psychiatric: She has a normal mood and affect. Her behavior is normal.  Judgment and thought content normal.  Vitals reviewed.     Adult ECG Report  Rate: 54 ;  Rhythm: sinus bradycardia and Normal axis, intervals and durations.;  Borderline voltage  Narrative Interpretation: Normal EKG  Recent Labs:    Lab Results  Component Value Date   CHOL 153 03/19/2018   HDL 60 03/19/2018   LDLCALC 74 03/19/2018   TRIG 93 03/19/2018   CHOLHDL 2.6 03/19/2018   Lab Results  Component Value Date   CREATININE 0.63 06/29/2018   BUN  8 06/29/2018   NA 143 06/29/2018   K 3.5 07/06/2018   CL 102 06/29/2018   CO2 25 06/29/2018    ASSESSMENT/PLAN    Problem List Items Addressed This Visit    Aortoiliac occlusive disease ([place]) (Chronic)    Occluded distal aorta.  Stable claudication.  No plans for surgical management at this time.  Continue blood pressure and lipid control.  Thankfully she has quit smoking.      Accelerated hypertension (Chronic)    She still has up-and-down blood pressures.  A lot of acid do that when she takes her medications.  She is currently on max dose of amlodipine and lisinopril.  On modest dose of carvedilol which with a resting heart rate of 54, we cannot titrate further.  Remains on spironolactone as well.  Diuretic has been stopped because of hypokalemia.  I agree that she needs to work on dietary modification.  Difficult to titrate medications with multiple players involved.  Since her pressures are being currently managed by PCP and the [facility], will defer to them for further titration.  Essentially we would require another medication.  Options would include adding back a diuretic such as Maxide and follow potassium levels closely versus adding hydralazine which is more complicated.      Relevant Orders   EKG 12-Lead (Completed)   Hyperlipidemia with target LDL less than 70 (Chronic)    Recent labs from January showed an LDL of 74.  Should be having labs due in January again.  With her  having PAD, target LDL should be less than 70 if not closer to 50.  Is currently on 40 mg atorvastatin.  Her current levels were better than last.  Would like to see follow-up labs, low threshold to consider converting to rosuvastatin or adding Zetia 10 mg. His PCP is checking labs I would defer to her.      Preop cardiovascular exam    No active cardiac symptoms.  She does not have any chest pain or pressure.  No heart failure symptoms.  Edema does not seem to be related to heart failure, more related to simply venous stasis.  Current blood pressure level should be stable for her to proceed with her dental procedure.       Other Visit Diagnoses    PAD (peripheral artery disease) ([place])    -  Primary       COVID-19 Education: The signs and symptoms of COVID-19 were discussed with the patient and how to seek care for testing (follow up with PCP or arrange E-visit).   The importance of social distancing was discussed today.  I spent a total of 62minutes with the patient and chart review. >  50% of the time was spent in direct patient consultation.  Additional time spent with chart review (studies, outside notes, etc): 6 Total Time: 24 min   Current medicines are reviewed at length with the patient today.  (+/- concerns) n/a   Patient Instructions / Medication Changes & Studies & Tests Ordered   Patient Instructions  Medication Instructions:  NO CHANGES *If you need a refill on your cardiac medications before your next appointment, please call your pharmacy*  Lab Work: NOT NEEDED  Testing/Procedures: NOT NEEDED Follow-Up: At [facility], you and your health needs are our priority.  As part of our continuing mission to provide you with exceptional heart care, we have created designated Provider Care Teams.  These Care Teams  include your primary Cardiologist (physician) and Advanced Practice Providers (APPs -  Physician Assistants and Nurse Practitioners) who all work  together to provide you with the care you need, when you need it.  Your next appointment:   12 months  The format for your next appointment:   In Person  Provider:   Glenetta Hew, MD  Other Instructions     Studies Ordered:   Orders Placed This Encounter  Procedures  . EKG 12-Lead     Brandy Stevenson, M.D., M.S. Interventional Cardiologist   Pager # [phone] Phone # [phone] [address] [place], [place] [address]   Thank you for choosing Heartcare at [facility]!!

## 2019-01-18 NOTE — Patient Instructions (Signed)
Medication Instructions:   NO CHANGES  *If you need a refill on your cardiac medications before your next appointment, please call your pharmacy*   Lab Work: NOT NEEDED     Testing/Procedures: NOT NEEDED   Follow-Up: At [facility], you and your health needs are our priority.  As part of our continuing mission to provide you with exceptional heart care, we have created designated Provider Care Teams.  These Care Teams include your primary Cardiologist (physician) and Advanced Practice Providers (APPs -  Physician Assistants and Nurse Practitioners) who all work together to provide you with the care you need, when you need it.     Your next appointment:   12 month(s)  The format for your next appointment:   In Person  Provider:   David Harding, MD    Other Instructions  

## 2019-01-20 ENCOUNTER — Encounter: Payer: Self-pay | Admitting: Cardiology

## 2019-01-20 DIAGNOSIS — Z01810 Encounter for preprocedural cardiovascular examination: Secondary | ICD-10-CM | POA: Insufficient documentation

## 2019-01-20 NOTE — Assessment & Plan Note (Signed)
No active cardiac symptoms.  She does not have any chest pain or pressure.  No heart failure symptoms.  Edema does not seem to be related to heart failure, more related to simply venous stasis.  Current blood pressure level should be stable for her to proceed with her dental procedure.

## 2019-01-20 NOTE — Assessment & Plan Note (Signed)
She still has up-and-down blood pressures.  A lot of acid do that when she takes her medications.  She is currently on max dose of amlodipine and lisinopril.  On modest dose of carvedilol which with a resting heart rate of 54, we cannot titrate further.  Remains on spironolactone as well.  Diuretic has been stopped because of hypokalemia.  I agree that she needs to work on dietary modification.  Difficult to titrate medications with multiple players involved.  Since her pressures are being currently managed by PCP and the clinical pharmacy team [facility], will defer to them for further titration.  Essentially we would require another medication.  Options would include adding back a diuretic such as Maxide and follow potassium levels closely versus adding hydralazine which is more complicated.

## 2019-01-20 NOTE — Assessment & Plan Note (Signed)
Occluded distal aorta.  Stable claudication.  No plans for surgical management at this time.  Continue blood pressure and lipid control.  Thankfully she has quit smoking.

## 2019-01-20 NOTE — Assessment & Plan Note (Signed)
Recent labs from January showed an LDL of 74.  Should be having labs due in January again.  With her having PAD, target LDL should be less than 70 if not closer to 50.  Is currently on 40 mg atorvastatin.  Her current levels were better than last.  Would like to see follow-up labs, low threshold to consider converting to rosuvastatin or adding Zetia 10 mg. His PCP is checking labs I would defer to her.

## 2019-01-21 ENCOUNTER — Other Ambulatory Visit: Payer: Self-pay

## 2019-01-21 ENCOUNTER — Ambulatory Visit (HOSPITAL_BASED_OUTPATIENT_CLINIC_OR_DEPARTMENT_OTHER): Payer: Medicare HMO | Admitting: Internal Medicine

## 2019-01-21 ENCOUNTER — Other Ambulatory Visit (HOSPITAL_COMMUNITY)
Admission: RE | Admit: 2019-01-21 | Discharge: 2019-01-21 | Disposition: A | Discharge status: Home / Self Care | Payer: Medicare HMO | Source: Ambulatory Visit | Attending: Internal Medicine | Admitting: Internal Medicine

## 2019-01-21 ENCOUNTER — Encounter: Payer: Self-pay | Admitting: Internal Medicine

## 2019-01-21 VITALS — BP 147/99 | HR 55 | Temp 98.6°F | Resp 16 | Wt 188.0 lb

## 2019-01-21 DIAGNOSIS — Z124 Encounter for screening for malignant neoplasm of cervix: Secondary | ICD-10-CM | POA: Diagnosis not present

## 2019-01-21 DIAGNOSIS — Z1151 Encounter for screening for human papillomavirus (HPV): Secondary | ICD-10-CM | POA: Diagnosis not present

## 2019-01-21 DIAGNOSIS — I10 Essential (primary) hypertension: Secondary | ICD-10-CM

## 2019-01-21 DIAGNOSIS — Z780 Asymptomatic menopausal state: Secondary | ICD-10-CM | POA: Insufficient documentation

## 2019-01-21 MED ORDER — HYDRALAZINE HCL 10 MG PO TABS: 10.0000 mg | ORAL_TABLET | Freq: Two times a day (BID) | ORAL | 3 refills | Status: DC

## 2019-01-21 MED FILL — hydrALAZINE HCL 10 MG TABS: 10 | 30 days supply | Qty: 60 | Fill #0

## 2019-01-21 NOTE — Patient Instructions (Signed)
Your blood pressure is not controlled.  We have added a new blood pressure medication called hydralazine 10 mg to take twice a day.  Continue to monitor your home blood pressure.  Goal is 130/80 or lower.

## 2019-01-21 NOTE — Progress Notes (Signed)
Patient ID: Brandy Stevenson, female    DOB: Aug 04, 1955  MRN: [id number]  CC: Gynecologic Exam   Subjective: Brandy Stevenson is a 63 y.o. female who presents for PAP Her concerns today include:   Pt is G4P3 No abn PAP in past.  Pt is post-menopausal. No vaginal dischg or itching.  She is one female partner.  She declines STD screening. No fhx of uterine, ovarian or cervical cancer  HTN: still checking BP at home.  BP last night was 144/85. Compliant with meds.    Patient Active Problem List   Diagnosis Date Noted  . Preop cardiovascular exam 01/20/2019  . Cervical radiculopathy 10/01/2018  . Hypertensive retinopathy 09/08/2018  . Seasonal allergies 06/29/2018  . Callous ulcer ([place]) 03/16/2018  . Aortoiliac occlusive disease ([place]) 11/04/2017  . DOE (dyspnea on exertion) 11/03/2017  . Atypical chest pain 11/03/2017  . Ichthyosis 10/06/2017  . Former smoker 04/07/2017  . Primary osteoarthritis of right shoulder 04/07/2017  . Chronic midline low back pain without sciatica 04/07/2017  . Hiatal hernia 05/01/2016  . DJD (degenerative joint disease) of cervical spine 04/17/2016  . History of breast cancer 02/18/2016  . Foot callus 02/18/2016  . Anxiety and depression 05/22/2015  . Insomnia 03/15/2015  . Unspecified vitamin D deficiency 04/08/2013  . Hyperlipidemia with target LDL less than 70 04/08/2013  . Atherosclerotic PVD with intermittent claudication ([place]) 03/22/2013  . Accelerated hypertension 02/23/2013  . GERD (gastroesophageal reflux disease) 02/23/2013  . COPD (chronic obstructive pulmonary disease) ([place]) 02/23/2013     Current Outpatient Medications on File Prior to Visit  Medication Sig Dispense Refill  . albuterol (PROVENTIL HFA;VENTOLIN HFA) 108 (90 Base) MCG/ACT inhaler Inhale 2 puffs into the lungs every 6 (six) hours as needed for wheezing or shortness of breath. 1 Inhaler 2  . amLODipine (NORVASC) 10 MG tablet TAKE 1 TABLET BY MOUTH AT  BEDTIME 90 tablet 3   . ammonium lactate (LAC-HYDRIN) 12 % cream Apply topically as needed for dry skin. 385 g 0  . amoxicillin (AMOXIL) 500 MG capsule Take 1 capsule (500 mg total) by mouth 3 (three) times daily. 21 capsule 0  . aspirin EC 81 MG tablet Take 1 tablet (81 mg total) by mouth daily. 90 tablet 3  . atorvastatin (LIPITOR) 40 MG tablet Take 1 tablet (40 mg total) by mouth daily. 90 tablet 1  . carvedilol (COREG) 6.25 MG tablet Take 1 tablet (6.25 mg total) by mouth 2 (two) times daily. 180 tablet 3  . cyclobenzaprine (FLEXERIL) 5 MG tablet TAKE 1 TABLET TWICE DAILY AS NEEDED FOR MUSCLE SPASM(S) 30 tablet 0  . Fluticasone-Salmeterol (ADVAIR) 100-50 MCG/DOSE AEPB Inhale 1 puff into the lungs 2 (two) times daily. 180 each 3  . gabapentin (NEURONTIN) 300 MG capsule TAKE 1 CAPSULE AT BEDTIME 90 capsule 2  . lisinopril (ZESTRIL) 40 MG tablet Take 1 tablet (40 mg total) by mouth daily. 90 tablet 1  . meloxicam (MOBIC) 15 MG tablet Take 1 tablet (15 mg total) by mouth daily. 90 tablet 1  . omeprazole (PRILOSEC) 40 MG capsule Take 1 capsule (40 mg total) by mouth daily. 90 capsule 1  . Potassium Chloride ER 20 MEQ TBCR 1 tab PO Q Wed and Sat 90 tablet 0  . spironolactone (ALDACTONE) 25 MG tablet Take 1 tablet (25 mg total) by mouth daily. 90 tablet 3   No current facility-administered medications on file prior to visit.     Allergies  Allergen Reactions  . Vicodin [  Hydrocodone-Acetaminophen] Nausea And Vomiting and Other (See Comments)    Sweating  . Bactrim [Sulfamethoxazole-Trimethoprim]     HA and vomiting  . Incruse Ellipta [Umeclidinium Bromide]     Dry mouth  . Wellbutrin [Bupropion]     Made me feel bad    Social History   Socioeconomic History  . Marital status: Married    Spouse name: Not on file  . Number of children: Not on file  . Years of education: Not on file  . Highest education level: Not on file  Occupational History  . Not on file  Social Needs  . Financial resource strain:  Not on file  . Food insecurity    Worry: Not on file    Inability: Not on file  . Transportation needs    Medical: Not on file    Non-medical: Not on file  Tobacco Use  . Smoking status: Former Smoker    Packs/day: 0.25    Years: 40.00    Pack years: 10.00    Types: Cigarettes    Quit date: 02/08/2017    Years since quitting: 1.9  . Smokeless tobacco: Never Used  . Tobacco comment: 3 cigarettes per day  Substance and Sexual Activity  . Alcohol use: No  . Drug use: No  . Sexual activity: Not Currently  Lifestyle  . Physical activity    Days per week: Not on file    Minutes per session: Not on file  . Stress: Not on file  Relationships  . Social Herbalist on phone: Not on file    Gets together: Not on file    Attends religious service: Not on file    Active member of club or organization: Not on file    Attends meetings of clubs or organizations: Not on file    Relationship status: Not on file  . Intimate partner violence    Fear of current or ex partner: Not on file    Emotionally abused: Not on file    Physically abused: Not on file    Forced sexual activity: Not on file  Other Topics Concern  . Not on file  Social History Narrative  . Not on file    Family History  Problem Relation Age of Onset  . Hyperlipidemia Mother   . Hypertension Mother   . Heart disease Mother        Atrial Fib.  . Hyperlipidemia Sister   . Hypertension Sister   . Heart disease Sister        Before age 76,  CHF  . Heart attack Brother   . Hyperlipidemia Brother   . Hypertension Brother   . Heart disease Brother   . Hypertension Daughter     Past Surgical History:  Procedure Laterality Date  . APPENDECTOMY  1980  . BREAST LUMPECTOMY Left    s/p radiation therapy   . BREAST SURGERY    . COLONOSCOPY  07/2015  . COLONOSCOPY WITH PROPOFOL N/A 06/26/2015   Procedure: COLONOSCOPY WITH PROPOFOL;  Surgeon: [name], MD;  Location: [facility];  Service: Endoscopy;   Laterality: N/A;  . CT CTA CORONARY W/CA SCORE W/CM &/OR WO/CM  12/10/2017   Coronary calcium score is 0.  No evidence of CAD.  [name] PERIPHERAL VASCULAR CATHETERIZATION N/A 10/22/2015   Procedure: Abdominal Aortogram w/Lower Extremity;  Surgeon: [name], MD;  Location: [facility] INVASIVE CV LAB: Aortic arch widely patent as far as innominate artery, right subclavian artery,  right vertebral artery and right, common carotid.  Bilateral single renal arteries - Nonocclusive disease. Infrarenal aorta 100% - bilateral common Iliac 100% --> B Ex Iliacs fill via Int Iliac. Leg OK  . TRANSTHORACIC ECHOCARDIOGRAM  08/2017   EF 60-65%. No RWMA.  Normal DF.  Normal valves.   . TUBAL LIGATION  1991    ROS: Review of Systems Negative except as stated above  PHYSICAL EXAM: BP (!) 147/99   Pulse (!) 55   Temp 98.6 F (37 C) (Oral)   Resp 16   Wt 188 lb (85.3 kg)   SpO2 99%   BMI 31.28 kg/m   BP 160/90 Physical Exam  General appearance - alert, well appearing, and in no distress Mental status - normal mood, behavior, speech, dress, motor activity, and thought processes Pelvic -CMA Magdalen Spatz present.  Normal external genitalia, vulva, vagina, cervix, uterus and adnexa   CMP Latest Ref Rng & Units 07/06/2018 06/29/2018 03/19/2018  Glucose 65 - 99 mg/dL - 83 -  BUN 8 - 27 mg/dL - 8 -  Creatinine 0.57 - 1.00 mg/dL - 0.63 -  Sodium 134 - 144 mmol/L - 143 -  Potassium 3.5 - 5.2 mmol/L 3.5 3.3(L) -  Chloride 96 - 106 mmol/L - 102 -  CO2 20 - 29 mmol/L - 25 -  Calcium 8.7 - 10.3 mg/dL - 9.9 -  Total Protein 6.0 - 8.5 g/dL - 7.8 7.6  Total Bilirubin 0.0 - 1.2 mg/dL - 0.6 0.4  Alkaline Phos 39 - 117 IU/L - 67 81  AST 0 - 40 IU/L - 20 23  ALT 0 - 32 IU/L - 19 27   Lipid Panel     Component Value Date/Time   CHOL 153 03/19/2018 1024   TRIG 93 03/19/2018 1024   HDL 60 03/19/2018 1024   CHOLHDL 2.6 03/19/2018 1024   CHOLHDL 2.8 07/27/2014 1207   VLDL 32 07/27/2014 1207   LDLCALC 74  03/19/2018 1024    CBC    Component Value Date/Time   WBC 6.6 08/25/2017 0118   RBC 4.22 08/25/2017 0118   HGB 12.2 09/29/2017 0526   HGB 11.4 07/06/2017 1046   HGB 13.8 06/12/2008 0939   HCT 36.0 09/29/2017 0526   HCT 33.2 (L) 07/06/2017 1046   HCT 40.2 06/12/2008 0939   PLT 230 08/25/2017 0118   PLT 191 07/06/2017 1046   MCV 93.6 08/25/2017 0118   MCV 91 07/06/2017 1046   MCV 90.1 06/12/2008 0939   MCH 30.3 08/25/2017 0118   MCHC 32.4 08/25/2017 0118   RDW 13.8 08/25/2017 0118   RDW 14.0 07/06/2017 1046   RDW 13.9 06/12/2008 0939   LYMPHSABS 0.4 (L) 03/10/2017 1335   LYMPHSABS 1.5 06/12/2008 0939   MONOABS 0.5 03/23/2016 2308   MONOABS 0.6 06/12/2008 0939   EOSABS 0.1 03/10/2017 1335   BASOSABS 0.0 03/10/2017 1335   BASOSABS 0.0 06/12/2008 0939    ASSESSMENT AND PLAN: 1. Pap smear for cervical cancer screening - Cytology - PAP([place])  2. Essential hypertension Not at goal.  Continue current medications.  We will add a low-dose of hydralazine.  Advised to continue to monitor  blood pressure at home - hydrALAZINE (APRESOLINE) 10 MG tablet; Take 1 tablet (10 mg total) by mouth 2 (two) times daily.  Dispense: 60 tablet; Refill: 3    Patient was given the opportunity to ask questions.  Patient verbalized understanding of the plan and was able to repeat key elements of the plan.  No orders of the defined types were placed in this encounter.    Requested Prescriptions   Signed Prescriptions Disp Refills  . hydrALAZINE (APRESOLINE) 10 MG tablet 60 tablet 3    Sig: Take 1 tablet (10 mg total) by mouth 2 (two) times daily.    Return in about 3 months (around 04/23/2019).  Karle Plumber, MD, FACP

## 2019-01-26 LAB — CYTOLOGY - PAP
Comment: NEGATIVE
Diagnosis: NEGATIVE
High risk HPV: NEGATIVE

## 2019-02-01 ENCOUNTER — Telehealth: Payer: Self-pay | Admitting: Internal Medicine

## 2019-02-01 DIAGNOSIS — I10 Essential (primary) hypertension: Secondary | ICD-10-CM

## 2019-02-01 MED ORDER — CARVEDILOL 6.25 MG PO TABS: 6.2500 mg | ORAL_TABLET | Freq: Two times a day (BID) | ORAL | 1 refills | Status: DC

## 2019-02-01 NOTE — Telephone Encounter (Signed)
Will forward to pcp

## 2019-02-01 NOTE — Telephone Encounter (Signed)
Refills sent

## 2019-02-01 NOTE — Telephone Encounter (Signed)
Patient calling in regards to dental surgeon needing surgical clearance from Dr Wynetta Emery. Please contact patient.

## 2019-02-01 NOTE — Telephone Encounter (Signed)
1) Medication(s) Requested (by name): carvedilol (COREG) 6.25 MG tablet [id number]   2) Pharmacy of Choice:  [facility] [place] [facility] is requesting updated prescriptions. Patient is okay with coming to [facility] for prescription as she only has one left for today.   Approved medications will be sent to pharmacy, we will reach out to you if there is an issue.  Requests made after 3pm may not be addressed until following business day!

## 2019-02-02 ENCOUNTER — Other Ambulatory Visit: Payer: Self-pay

## 2019-02-02 DIAGNOSIS — I10 Essential (primary) hypertension: Secondary | ICD-10-CM

## 2019-02-02 MED ORDER — ATORVASTATIN CALCIUM 40 MG PO TABS: 40.0000 mg | ORAL_TABLET | Freq: Every day | ORAL | 1 refills | Status: DC

## 2019-02-02 MED ORDER — CARVEDILOL 6.25 MG PO TABS: 6.2500 mg | ORAL_TABLET | Freq: Two times a day (BID) | ORAL | 1 refills | Status: DC

## 2019-02-02 NOTE — Telephone Encounter (Signed)
Faxed surgical clearance on 11/5 will refax today

## 2019-02-03 ENCOUNTER — Ambulatory Visit: Payer: 59 | Admitting: Internal Medicine

## 2019-02-08 ENCOUNTER — Telehealth: Payer: Self-pay | Admitting: Internal Medicine

## 2019-02-08 NOTE — Telephone Encounter (Signed)
1) Medication(s) Requested (by name): spironolactone (ALDACTONE) 25 MG tablet [id number]  gabapentin (NEURONTIN) 300 MG capsule [id number]  amLODipine (NORVASC) 10 MG tablet [id number]   2) Pharmacy of Choice: [place], [address]  Patient is requesting all medications to go to mail order through [facility], medications listed above can go to [facility] until we cant get it switched to [facility].    Approved medications will be sent to pharmacy, we will reach out to you if there is an issue.  Requests made after 3pm may not be addressed until following business day!

## 2019-02-09 ENCOUNTER — Other Ambulatory Visit: Payer: Self-pay | Admitting: Pharmacist

## 2019-02-09 ENCOUNTER — Other Ambulatory Visit: Payer: Self-pay | Admitting: Internal Medicine

## 2019-02-09 DIAGNOSIS — I70219 Atherosclerosis of native arteries of extremities with intermittent claudication, unspecified extremity: Secondary | ICD-10-CM

## 2019-02-09 MED ORDER — GABAPENTIN 300 MG PO CAPS: 300.0000 mg | ORAL_CAPSULE | Freq: Every day | ORAL | 2 refills | Status: DC

## 2019-02-09 NOTE — Telephone Encounter (Signed)
Dr. Wynetta Emery pt is requesting a refill on Gabapentin

## 2019-02-09 NOTE — Telephone Encounter (Signed)
Brandy Stevenson may you take care of this  

## 2019-02-09 NOTE — Telephone Encounter (Signed)
Dr. Wynetta Emery will have to approve gabapentin. I have placed refills in [facility] pharmacy for spironolactone. The amlodipine has already been sent to [facility].

## 2019-02-10 ENCOUNTER — Other Ambulatory Visit: Payer: Self-pay

## 2019-02-10 DIAGNOSIS — I70219 Atherosclerosis of native arteries of extremities with intermittent claudication, unspecified extremity: Secondary | ICD-10-CM

## 2019-02-10 MED ORDER — GABAPENTIN 300 MG PO CAPS: 300.0000 mg | ORAL_CAPSULE | Freq: Every day | ORAL | 2 refills | Status: DC

## 2019-02-10 MED FILL — SPIRONOLACTONE 25 MG TABLET: 25 | 90 days supply | Qty: 90 | Fill #3

## 2019-02-14 ENCOUNTER — Other Ambulatory Visit: Payer: Self-pay | Admitting: Pharmacist

## 2019-02-14 ENCOUNTER — Other Ambulatory Visit: Payer: Self-pay | Admitting: Cardiology

## 2019-02-14 DIAGNOSIS — Z86010 Personal history of colonic polyps: Secondary | ICD-10-CM | POA: Diagnosis not present

## 2019-02-14 MED ORDER — AMLODIPINE BESYLATE 10 MG PO TABS: 10.0000 mg | ORAL_TABLET | Freq: Every day | ORAL | 2 refills | Status: DC

## 2019-02-14 MED FILL — NULYTELY LEMON-LI SOL: 1 days supply | Qty: 4000 | Fill #0

## 2019-02-15 ENCOUNTER — Other Ambulatory Visit: Payer: Self-pay

## 2019-02-15 DIAGNOSIS — I70219 Atherosclerosis of native arteries of extremities with intermittent claudication, unspecified extremity: Secondary | ICD-10-CM

## 2019-02-15 DIAGNOSIS — I739 Peripheral vascular disease, unspecified: Secondary | ICD-10-CM

## 2019-02-15 MED FILL — AMLODIPINE BESYLATE 10 MG T: 10 | 90 days supply | Qty: 90 | Fill #0

## 2019-02-16 ENCOUNTER — Ambulatory Visit (INDEPENDENT_AMBULATORY_CARE_PROVIDER_SITE_OTHER): Payer: Medicare HMO | Admitting: Vascular Surgery

## 2019-02-16 ENCOUNTER — Encounter: Payer: Self-pay | Admitting: Vascular Surgery

## 2019-02-16 ENCOUNTER — Other Ambulatory Visit: Payer: Self-pay

## 2019-02-16 ENCOUNTER — Ambulatory Visit (HOSPITAL_COMMUNITY)
Admission: RE | Admit: 2019-02-16 | Discharge: 2019-02-16 | Disposition: A | Discharge status: Home / Self Care | Payer: Medicare HMO | Source: Ambulatory Visit | Attending: Vascular Surgery | Admitting: Vascular Surgery

## 2019-02-16 VITALS — BP 131/77 | HR 59 | Temp 97.7°F | Resp 20 | Ht 65.0 in | Wt 186.7 lb

## 2019-02-16 DIAGNOSIS — I70219 Atherosclerosis of native arteries of extremities with intermittent claudication, unspecified extremity: Secondary | ICD-10-CM

## 2019-02-16 DIAGNOSIS — J439 Emphysema, unspecified: Secondary | ICD-10-CM | POA: Diagnosis not present

## 2019-02-16 DIAGNOSIS — I7409 Other arterial embolism and thrombosis of abdominal aorta: Secondary | ICD-10-CM | POA: Diagnosis not present

## 2019-02-16 DIAGNOSIS — J449 Chronic obstructive pulmonary disease, unspecified: Secondary | ICD-10-CM | POA: Diagnosis not present

## 2019-02-16 DIAGNOSIS — I739 Peripheral vascular disease, unspecified: Secondary | ICD-10-CM

## 2019-02-16 NOTE — Progress Notes (Signed)
Patient name: Brandy Stevenson MRN: [id number] DOB: 1955/04/08 Sex: female  REASON FOR VISIT:   Follow-up of aortic occlusion.  HPI:   Brandy Stevenson is a pleasant 63 y.o. female who I have been following with a known aortic occlusion.  Since I saw her last in March of this year she continues to have stable bilateral calf claudication which is equal on both sides.  Her symptoms are brought on by ambulation and relieved with rest.  She states that her legs do get tired a little quicker since recently she has not been walking as much.  She denies any history of rest pain.  She does get cramps at night.  She denies any history of nonhealing wounds.  She quit smoking in 2018.  Her daughter moved away so she is walking less because she has some dogs in the neighborhood that she is afraid of.  However I am encouraged her to keep walking as much as possible.  She is on aspirin and is on a statin.  She has expressed interest in the vascular rehab program if this ever is established.  There is been no significant changes in her medical history.  She has been off of her gabapentin because of a change in insurance but she is scheduled to get back on this.  She states this does help her cramps at night.  Past Medical History:  Diagnosis Date  . Arthritis    back problems to be evaluated by neurology next month  . Cancer ([place]) 2009   L, Breast, radiation and surgery.  [name] COPD (chronic obstructive pulmonary disease) ([place])   . Depression   . GERD (gastroesophageal reflux disease)   . Hyperlipemia   . Hypertension   . Leriche syndrome ([place])    Infrarenal aortic occlusion extending to bilateral internal iliac arteries.  External iliacs fill via collaterals from lumbar arteries to hypogastric/internal iliac arteries -minimal disease on the right side and no disease on the left side leg arteries distally.  . Peripheral vascular disease ([place]) 2013    Family History  Problem Relation Age of Onset  .  Hyperlipidemia Mother   . Hypertension Mother   . Heart disease Mother        Atrial Fib.  . Hyperlipidemia Sister   . Hypertension Sister   . Heart disease Sister        Before age 77,  CHF  . Heart attack Brother   . Hyperlipidemia Brother   . Hypertension Brother   . Heart disease Brother   . Hypertension Daughter     SOCIAL HISTORY: Social History   Tobacco Use  . Smoking status: Former Smoker    Packs/day: 0.25    Years: 40.00    Pack years: 10.00    Types: Cigarettes    Quit date: 02/08/2017    Years since quitting: 2.0  . Smokeless tobacco: Never Used  . Tobacco comment: 3 cigarettes per day  Substance Use Topics  . Alcohol use: No    Allergies  Allergen Reactions  . Vicodin [Hydrocodone-Acetaminophen] Nausea And Vomiting and Other (See Comments)    Sweating  . Bactrim [Sulfamethoxazole-Trimethoprim]     HA and vomiting  . Incruse Ellipta [Umeclidinium Bromide]     Dry mouth  . Wellbutrin [Bupropion]     Made me feel bad    Current Outpatient Medications  Medication Sig Dispense Refill  . albuterol (PROVENTIL HFA;VENTOLIN HFA) 108 (90 Base) MCG/ACT inhaler Inhale 2 puffs into the lungs every  6 (six) hours as needed for wheezing or shortness of breath. 1 Inhaler 2  . amLODipine (NORVASC) 10 MG tablet Take 1 tablet (10 mg total) by mouth at bedtime. 30 tablet 2  . ammonium lactate (LAC-HYDRIN) 12 % cream Apply topically as needed for dry skin. 385 g 0  . amoxicillin (AMOXIL) 500 MG capsule Take 1 capsule (500 mg total) by mouth 3 (three) times daily. 21 capsule 0  . aspirin EC 81 MG tablet Take 1 tablet (81 mg total) by mouth daily. 90 tablet 3  . atorvastatin (LIPITOR) 40 MG tablet Take 1 tablet (40 mg total) by mouth daily. 90 tablet 1  . carvedilol (COREG) 6.25 MG tablet Take 1 tablet (6.25 mg total) by mouth 2 (two) times daily. 180 tablet 1  . cyclobenzaprine (FLEXERIL) 5 MG tablet TAKE 1 TABLET TWICE DAILY AS NEEDED FOR MUSCLE SPASM(S) 30 tablet 0  .  Fluticasone-Salmeterol (ADVAIR) 100-50 MCG/DOSE AEPB Inhale 1 puff into the lungs 2 (two) times daily. 180 each 3  . gabapentin (NEURONTIN) 300 MG capsule Take 1 capsule (300 mg total) by mouth at bedtime. 90 capsule 2  . hydrALAZINE (APRESOLINE) 10 MG tablet Take 1 tablet (10 mg total) by mouth 2 (two) times daily. 60 tablet 3  . lisinopril (ZESTRIL) 40 MG tablet Take 1 tablet (40 mg total) by mouth daily. 90 tablet 1  . meloxicam (MOBIC) 15 MG tablet Take 1 tablet (15 mg total) by mouth daily. 90 tablet 1  . omeprazole (PRILOSEC) 40 MG capsule Take 1 capsule (40 mg total) by mouth daily. 90 capsule 1  . Potassium Chloride ER 20 MEQ TBCR 1 tab PO Q Wed and Sat 90 tablet 0  . spironolactone (ALDACTONE) 25 MG tablet Take 1 tablet (25 mg total) by mouth daily. 90 tablet 3   No current facility-administered medications for this visit.    REVIEW OF SYSTEMS:  [X]  denotes positive finding, [ ]  denotes negative finding Cardiac  Comments:  Chest pain or chest pressure:    Shortness of breath upon exertion: x   Short of breath when lying flat:    Irregular heart rhythm:        Vascular    Pain in calf, thigh, or hip brought on by ambulation: x   Pain in feet at night that wakes you up from your sleep:  x   Blood clot in your veins:    Leg swelling:  x       Pulmonary    Oxygen at home:    Productive cough:     Wheezing:  x       Neurologic    Sudden weakness in arms or legs:     Sudden numbness in arms or legs:     Sudden onset of difficulty speaking or slurred speech:    Temporary loss of vision in one eye:     Problems with dizziness:         Gastrointestinal    Blood in stool:     Vomited blood:         Genitourinary    Burning when urinating:     Blood in urine:        Psychiatric    Major depression:         Hematologic    Bleeding problems:    Problems with blood clotting too easily:        Skin    Rashes or ulcers:  Constitutional    Fever or chills:      PHYSICAL EXAM:   Vitals:   02/16/19 1421  BP: 131/77  Pulse: (!) 59  Resp: 20  Temp: 97.7 F (36.5 C)  SpO2: 99%  Weight: 186 lb 11.2 oz (84.7 kg)  Height: 5\' 5"  (1.651 m)    GENERAL: The patient is a well-nourished female, in no acute distress. The vital signs are documented above. CARDIAC: There is a regular rate and rhythm.  VASCULAR: I do not detect carotid bruits. I cannot palpate femoral, popliteal, or pedal pulses bilaterally. She has no significant lower extremity swelling. PULMONARY: There is good air exchange bilaterally without wheezing or rales. ABDOMEN: Soft and non-tender with normal pitched bowel sounds.  MUSCULOSKELETAL: There are no major deformities or cyanosis. NEUROLOGIC: No focal weakness or paresthesias are detected. SKIN: There are no ulcers or rashes noted. PSYCHIATRIC: The patient has a normal affect.  DATA:    ARTERIAL DOPPLER STUDY: And independently interpreted her arterial Doppler study today.  On the right side there is a monophasic dorsalis pedis and posterior tibial signal.  ABI is 51%.  Toe pressure is 44 mmHg.  This is fairly stable compared to an ABI of 53% in March of this year.  On the left side there is a monophasic dorsalis pedis and posterior tibial signal.  ABI is 55%.  Toe pressure is 46 mmHg.  The ABI on the left has not significantly changed compared the study in March of this year when it was 60%.  I did review her previous CT scan that was done in August 2019.  She has diffuse calcific disease in her aorta and iliac arteries.  MEDICAL ISSUES:   AORTOILIAC OCCLUSIVE DISEASE: This patient has a known aortic occlusion.  She has stable claudication.  If she develops critical limb ischemia I think she would be a candidate for aortofemoral bypass grafting.  This would be associated with slightly increased risk given her severe calcific disease of the aorta.  However fortunately her symptoms have been stable.  I encouraged her to  continue to walk as much as possible.  She is very interested in our vascular rehab program if this is ever established.  I explained that because of the Covid situation this has been delayed somewhat.  I would like to see her back in 1 year and have ordered follow-up ABIs at that time.  She knows to call sooner if she has problems.  Deitra Mayo [facility] [phone]

## 2019-02-21 MED FILL — hydrALAZINE HCL 10 MG TABS: 10 | 30 days supply | Qty: 60 | Fill #1

## 2019-03-02 ENCOUNTER — Other Ambulatory Visit: Payer: Self-pay | Admitting: Internal Medicine

## 2019-03-02 DIAGNOSIS — J449 Chronic obstructive pulmonary disease, unspecified: Secondary | ICD-10-CM

## 2019-03-03 ENCOUNTER — Other Ambulatory Visit: Payer: Self-pay | Admitting: *Deleted

## 2019-03-03 DIAGNOSIS — I739 Peripheral vascular disease, unspecified: Secondary | ICD-10-CM

## 2019-03-09 ENCOUNTER — Other Ambulatory Visit: Payer: Self-pay

## 2019-03-09 ENCOUNTER — Ambulatory Visit
Admission: RE | Admit: 2019-03-09 | Discharge: 2019-03-09 | Disposition: A | Discharge status: Home / Self Care | Payer: Medicare HMO | Source: Ambulatory Visit | Attending: Internal Medicine | Admitting: Internal Medicine

## 2019-03-09 DIAGNOSIS — Z1231 Encounter for screening mammogram for malignant neoplasm of breast: Secondary | ICD-10-CM

## 2019-03-11 ENCOUNTER — Other Ambulatory Visit: Payer: Self-pay | Admitting: Internal Medicine

## 2019-03-11 DIAGNOSIS — Z760 Encounter for issue of repeat prescription: Secondary | ICD-10-CM

## 2019-03-11 DIAGNOSIS — I10 Essential (primary) hypertension: Secondary | ICD-10-CM

## 2019-03-11 NOTE — Telephone Encounter (Signed)
Please fill if appropriate request. Amlodipine was sent 12/14.

## 2019-03-16 ENCOUNTER — Other Ambulatory Visit: Payer: Self-pay

## 2019-03-16 ENCOUNTER — Ambulatory Visit
Admission: EM | Admit: 2019-03-16 | Discharge: 2019-03-16 | Disposition: A | Discharge status: Home / Self Care | Payer: Medicare HMO | Attending: Physician Assistant | Admitting: Physician Assistant

## 2019-03-16 DIAGNOSIS — I10 Essential (primary) hypertension: Secondary | ICD-10-CM

## 2019-03-16 DIAGNOSIS — Z760 Encounter for issue of repeat prescription: Secondary | ICD-10-CM | POA: Diagnosis not present

## 2019-03-16 MED ORDER — LISINOPRIL 40 MG PO TABS: 40.0000 mg | ORAL_TABLET | Freq: Every day | ORAL | 0 refills | Status: DC

## 2019-03-16 NOTE — Discharge Instructions (Signed)
Restart lisinopril. Continue to monitor BP and follow up with PCP as scheduled.

## 2019-03-16 NOTE — ED Triage Notes (Signed)
Pt states out of her lisinopril x3 days, states on her daily check her b/p 163/93. Pt denies any sx's

## 2019-03-16 NOTE — ED Provider Notes (Signed)
[name]    CSN: [id number] Arrival date & time: 03/16/19  0949      History   Chief Complaint Chief Complaint  Patient presents with  . Hypertension    HPI Brandy Stevenson is a 64 y.o. female.   64 year old female with history of HTN, HLD, COPD, breast cancer status post radiation/surgery comes in for elevated blood pressure readings after running out of lisinopril.  She has not had her lisinopril for 3 days.  States she gets her medication by mail order, and it has shown to have shipped few days ago, but has not received medication.  Has continued her other blood pressure medications.  She has been monitoring her BP at home, which usually runs 120s-140s/70-80s, and now is [id number] systolic, and therefore came in for medication refill.  She denies any chest pain, shortness of breath, palpitation.  Denies one-sided weakness, headache, dizziness, syncope.  She has been on lisinopril 40 mg daily for at least 1 year, without any obvious side effects.     Past Medical History:  Diagnosis Date  . Arthritis    back problems to be evaluated by neurology next month  . Cancer ([name]) 2009   L, Breast, radiation and surgery.  [name] COPD (chronic obstructive pulmonary disease) ([place])   . Depression   . GERD (gastroesophageal reflux disease)   . Hyperlipemia   . Hypertension   . Leriche syndrome ([name])    Infrarenal aortic occlusion extending to bilateral internal iliac arteries.  External iliacs fill via collaterals from lumbar arteries to hypogastric/internal iliac arteries -minimal disease on the right side and no disease on the left side leg arteries distally.  . Peripheral vascular disease ([name]) 2013    Patient Active Problem List   Diagnosis Date Noted  . Preop cardiovascular exam 01/20/2019  . Cervical radiculopathy 10/01/2018  . Hypertensive retinopathy 09/08/2018  . Seasonal allergies 06/29/2018  . Callous ulcer ([place]) 03/16/2018  . Aortoiliac occlusive disease  ([name]) 11/04/2017  . DOE (dyspnea on exertion) 11/03/2017  . Atypical chest pain 11/03/2017  . Ichthyosis 10/06/2017  . Former smoker 04/07/2017  . Primary osteoarthritis of right shoulder 04/07/2017  . Chronic midline low back pain without sciatica 04/07/2017  . Hiatal hernia 05/01/2016  . DJD (degenerative joint disease) of cervical spine 04/17/2016  . History of breast cancer 02/18/2016  . Foot callus 02/18/2016  . Anxiety and depression 05/22/2015  . Insomnia 03/15/2015  . Unspecified vitamin D deficiency 04/08/2013  . Hyperlipidemia with target LDL less than 70 04/08/2013  . Atherosclerotic PVD with intermittent claudication ([place]) 03/22/2013  . Accelerated hypertension 02/23/2013  . GERD (gastroesophageal reflux disease) 02/23/2013  . COPD (chronic obstructive pulmonary disease) ([place]) 02/23/2013    Past Surgical History:  Procedure Laterality Date  . APPENDECTOMY  1980  . BREAST LUMPECTOMY Left    s/p radiation therapy   . BREAST SURGERY    . COLONOSCOPY  07/2015  . COLONOSCOPY WITH PROPOFOL N/A 06/26/2015   Procedure: COLONOSCOPY WITH PROPOFOL;  Surgeon: [name], MD;  Location: [facility];  Service: Endoscopy;  Laterality: N/A;  . CT CTA CORONARY W/CA SCORE W/CM &/OR WO/CM  12/10/2017   Coronary calcium score is 0.  No evidence of CAD.  [name] PERIPHERAL VASCULAR CATHETERIZATION N/A 10/22/2015   Procedure: Abdominal Aortogram w/Lower Extremity;  Surgeon: [name], MD;  Location: [facility] INVASIVE CV LAB: Aortic arch widely patent as far as innominate artery, right subclavian artery, right vertebral artery and right, common  carotid.  Bilateral single renal arteries - Nonocclusive disease. Infrarenal aorta 100% - bilateral common Iliac 100% --> B Ex Iliacs fill via Int Iliac. Leg OK  . TRANSTHORACIC ECHOCARDIOGRAM  08/2017   EF 60-65%. No RWMA.  Normal DF.  Normal valves.   . TUBAL LIGATION  1991    OB History   No obstetric history on file.      Home  Medications    Prior to Admission medications   Medication Sig Start Date End Date Taking? Authorizing Provider  albuterol (VENTOLIN HFA) 108 (90 Base) MCG/ACT inhaler INHALE 2 PUFFS EVERY 6 HOURS AS NEEDED FOR WHEEZING 03/03/19   Fulp, Cammie, MD  amLODipine (NORVASC) 10 MG tablet Take 1 tablet (10 mg total) by mouth at bedtime. 02/14/19   [name], MD  ammonium lactate (LAC-HYDRIN) 12 % cream Apply topically as needed for dry skin. 10/06/17   [name], MD  aspirin EC 81 MG tablet Take 1 tablet (81 mg total) by mouth daily. 10/03/16   Funches, Adriana Mccallum, MD  atorvastatin (LIPITOR) 40 MG tablet Take 1 tablet (40 mg total) by mouth daily. 02/02/19   [name], MD  carvedilol (COREG) 6.25 MG tablet Take 1 tablet (6.25 mg total) by mouth 2 (two) times daily. 02/02/19   [name], MD  cyclobenzaprine (FLEXERIL) 5 MG tablet TAKE 1 TABLET TWICE DAILY AS NEEDED FOR MUSCLE SPASM(S) 06/10/18   [name], MD  Fluticasone-Salmeterol (ADVAIR) 100-50 MCG/DOSE AEPB Inhale 1 puff into the lungs 2 (two) times daily. 10/01/18   [name], MD  gabapentin (NEURONTIN) 300 MG capsule Take 1 capsule (300 mg total) by mouth at bedtime. 02/10/19   [name], MD  hydrALAZINE (APRESOLINE) 10 MG tablet Take 1 tablet (10 mg total) by mouth 2 (two) times daily. 01/21/19   [name], MD  lisinopril (ZESTRIL) 40 MG tablet Take 1 tablet (40 mg total) by mouth daily. 03/16/19   [name], PA-C  meloxicam (MOBIC) 15 MG tablet TAKE 1 TABLET EVERY DAY 03/11/19   [name], MD  omeprazole (PRILOSEC) 40 MG capsule TAKE 1 CAPSULE (40 MG TOTAL) BY MOUTH DAILY. 03/11/19   [name], MD  Potassium Chloride ER 20 MEQ TBCR 1 tab PO Q Wed and Sat 06/30/18   [name], MD  spironolactone (ALDACTONE) 25 MG tablet Take 1 tablet (25 mg total) by mouth daily. 03/16/18   [name], MD    Family History Family History  Problem Relation Age of Onset  .  Hyperlipidemia Mother   . Hypertension Mother   . Heart disease Mother        Atrial Fib.  . Hyperlipidemia Sister   . Hypertension Sister   . Heart disease Sister        Before age 92,  CHF  . Heart attack Brother   . Hyperlipidemia Brother   . Hypertension Brother   . Heart disease Brother   . Hypertension Daughter     Social History Social History   Tobacco Use  . Smoking status: Former Smoker    Packs/day: 0.25    Years: 40.00    Pack years: 10.00    Types: Cigarettes    Quit date: 02/08/2017    Years since quitting: 2.0  . Smokeless tobacco: Never Used  . Tobacco comment: 3 cigarettes per day  Substance Use Topics  . Alcohol use: No  . Drug use: No  Allergies   Vicodin [hydrocodone-acetaminophen], Bactrim [sulfamethoxazole-trimethoprim], Incruse ellipta [umeclidinium bromide], and Wellbutrin [bupropion]   Review of Systems Review of Systems  Reason unable to perform ROS: See HPI as above.     Physical Exam Triage Vital Signs ED Triage Vitals  Enc Vitals Group     BP 03/16/19 1005 (!) 176/102     Pulse Rate 03/16/19 1005 63     Resp 03/16/19 1005 16     Temp 03/16/19 1005 98.3 F (36.8 C)     Temp Source 03/16/19 1005 Oral     SpO2 03/16/19 1005 97 %     Weight --      Height --      Head Circumference --      Peak Flow --      Pain Score 03/16/19 1016 0     Pain Loc --      Pain Edu? --      Excl. in [place]? --    No data found.  Updated Vital Signs BP (!) 176/102 (BP Location: Right Arm)   Pulse 63   Temp 98.3 F (36.8 C) (Oral)   Resp 16   SpO2 97%   Physical Exam Constitutional:      General: She is not in acute distress.    Appearance: Normal appearance. She is well-developed. She is not toxic-appearing or diaphoretic.  HENT:     Head: Normocephalic and atraumatic.  Eyes:     Conjunctiva/sclera: Conjunctivae normal.     Pupils: Pupils are equal, round, and reactive to light.  Cardiovascular:     Rate and Rhythm: Normal rate  and regular rhythm.  Pulmonary:     Effort: Pulmonary effort is normal. No respiratory distress.     Comments: Speaking in full sentences without difficulty. LCTAB Musculoskeletal:     Cervical back: Normal range of motion and neck supple.  Skin:    General: Skin is warm and dry.  Neurological:     Mental Status: She is alert and oriented to person, place, and time.      UC Treatments / Results  Labs (all labs ordered are listed, but only abnormal results are displayed) Labs Reviewed - No data to display  EKG   Radiology No results found.  Procedures Procedures (including critical care time)  Medications Ordered in UC Medications - No data to display  Initial Impression / Assessment and Plan / UC Course  I have reviewed the triage vital signs and the nursing notes.  Pertinent labs & imaging results that were available during my care of the patient were reviewed by me and considered in my medical decision making (see chart for details).    Lisinopril refilled.  Continue to monitor BP.  Return precautions given.  Otherwise follow-up with PCP for further evaluation and management needed.  Patient expresses understanding and agrees to plan.  Final Clinical Impressions(s) / UC Diagnoses   Final diagnoses:  Essential hypertension   ED Prescriptions    Medication Sig Dispense Auth. Provider   lisinopril (ZESTRIL) 40 MG tablet Take 1 tablet (40 mg total) by mouth daily. 30 tablet [name], PA-C     PDMP not reviewed this encounter.   [name], PA-C 03/16/19 1055

## 2019-03-21 ENCOUNTER — Other Ambulatory Visit: Payer: Self-pay | Admitting: Internal Medicine

## 2019-03-21 DIAGNOSIS — J449 Chronic obstructive pulmonary disease, unspecified: Secondary | ICD-10-CM

## 2019-03-21 MED ORDER — ALBUTEROL SULFATE HFA 108 (90 BASE) MCG/ACT IN AERS: 2.0000 | INHALATION_SPRAY | Freq: Four times a day (QID) | RESPIRATORY_TRACT | 6 refills | Status: DC | PRN

## 2019-03-22 MED FILL — hydrALAZINE HCL 10 MG TABS: 10 | 30 days supply | Qty: 60 | Fill #2

## 2019-03-31 DIAGNOSIS — K029 Dental caries, unspecified: Secondary | ICD-10-CM | POA: Diagnosis not present

## 2019-04-06 DIAGNOSIS — Z1159 Encounter for screening for other viral diseases: Secondary | ICD-10-CM | POA: Diagnosis not present

## 2019-04-07 MED FILL — PEG 3350-ELECTROLYTE SOLN: 420 | 1 days supply | Qty: 4000 | Fill #0

## 2019-04-08 ENCOUNTER — Telehealth: Payer: Self-pay | Admitting: Internal Medicine

## 2019-04-08 NOTE — Telephone Encounter (Signed)
Pt sated  "since taking all meds for BP low recordings are read" Stated if she should continue to take all her BP meds and doses. F /u appt 04/28/2019 Please advice if anything else is needed prior to the appt. Thank you

## 2019-04-08 NOTE — Telephone Encounter (Signed)
Patient called saying that her BP has been running low. Patient states that on 04/05/19 her BP was 114/70 before medication with her heart rate being 57. After she took her medication her BP was 118/76 and with her heart rate being 57. Patient also stated that states that her BP this morning was 113/71 without medication and her heart rate was 51. Please f/u with pt.

## 2019-04-08 NOTE — Telephone Encounter (Signed)
ABOVE MESSAGE WAS GIVEN TO THE PT / VERBALIZED UNDERSTANDING

## 2019-04-11 DIAGNOSIS — D124 Benign neoplasm of descending colon: Secondary | ICD-10-CM | POA: Diagnosis not present

## 2019-04-11 DIAGNOSIS — K640 First degree hemorrhoids: Secondary | ICD-10-CM | POA: Diagnosis not present

## 2019-04-11 DIAGNOSIS — D125 Benign neoplasm of sigmoid colon: Secondary | ICD-10-CM | POA: Diagnosis not present

## 2019-04-11 DIAGNOSIS — K635 Polyp of colon: Secondary | ICD-10-CM | POA: Diagnosis not present

## 2019-04-11 DIAGNOSIS — Z86010 Personal history of colonic polyps: Secondary | ICD-10-CM | POA: Diagnosis not present

## 2019-04-13 DIAGNOSIS — D124 Benign neoplasm of descending colon: Secondary | ICD-10-CM | POA: Diagnosis not present

## 2019-04-13 DIAGNOSIS — D125 Benign neoplasm of sigmoid colon: Secondary | ICD-10-CM | POA: Diagnosis not present

## 2019-04-13 DIAGNOSIS — K635 Polyp of colon: Secondary | ICD-10-CM | POA: Diagnosis not present

## 2019-04-22 MED FILL — hydrALAZINE HCL 10 MG TABS: 10 | 10 days supply | Qty: 20 | Fill #3

## 2019-04-27 ENCOUNTER — Encounter: Payer: Self-pay | Admitting: Internal Medicine

## 2019-04-27 DIAGNOSIS — D126 Benign neoplasm of colon, unspecified: Secondary | ICD-10-CM | POA: Insufficient documentation

## 2019-04-28 ENCOUNTER — Encounter: Payer: Self-pay | Admitting: Internal Medicine

## 2019-04-28 ENCOUNTER — Other Ambulatory Visit: Payer: Self-pay

## 2019-04-28 ENCOUNTER — Ambulatory Visit: Payer: Medicare HMO | Admitting: Pharmacist

## 2019-04-28 ENCOUNTER — Ambulatory Visit: Payer: Medicare HMO | Attending: Internal Medicine | Admitting: Internal Medicine

## 2019-04-28 VITALS — BP 121/80 | HR 60 | Temp 97.7°F | Resp 16 | Wt 187.0 lb

## 2019-04-28 DIAGNOSIS — I739 Peripheral vascular disease, unspecified: Secondary | ICD-10-CM

## 2019-04-28 DIAGNOSIS — R0789 Other chest pain: Secondary | ICD-10-CM

## 2019-04-28 DIAGNOSIS — I7409 Other arterial embolism and thrombosis of abdominal aorta: Secondary | ICD-10-CM

## 2019-04-28 DIAGNOSIS — I10 Essential (primary) hypertension: Secondary | ICD-10-CM

## 2019-04-28 DIAGNOSIS — J449 Chronic obstructive pulmonary disease, unspecified: Secondary | ICD-10-CM

## 2019-04-28 NOTE — Progress Notes (Signed)
Pt states she has some discomfort under b/l breast. Pt states she only gets the discomfort when she moves

## 2019-04-28 NOTE — Patient Instructions (Signed)
Use your Advair inhaler twice a day as prescribed.  In doing so you will find that she will not have to use the albuterol inhaler as often.

## 2019-04-28 NOTE — Progress Notes (Signed)
Patient ID: Brandy Stevenson, female    DOB: 1956/02/15  MRN: [id number]  CC: Hypertension   Subjective: Brandy Stevenson is a 64 y.o. female who presents for chronic ds management Her concerns today include:  Hx of PAD, HTN, HL,formertob dep, LT breast CA (lumpectomy and XRT 2009), GERD, COPD, Trigeminal neuralgia RT, dep/anxiety, OA shoulders.  Since last visit, she has had colonoscopy and has had several adenomatous polyps removed.  Due again in 3 yrs.  HYPERTENSION Currently taking: see medication list Med Adherence: [x]  Yes    []  No Medication side effects: []  Yes    [x]  No Adherence with salt restriction: [x]  Yes    []  No Home Monitoring?: [x]  Yes    []  No Monitoring Frequency: 1-2 x a day Home BP results range: 113/69 yesterday.  Has log - 122/72, 135/78132/81, 117/71; pulse in 50s SOB? []  Yes    []  No Chest Pain?: [x]  Yes -pain under the breast "when I move like when I bend over or if I move a certain way."  Not pleuritic in nature and not associated with exertion.  Started last wk.  Intermittent and last a few seconds.  No radiation.  Some bloating when she eats dairy products.  She has been eating more ice cream as her mouth heals from having teeth extractions.  No N/V.  Leg swelling?: []  Yes    [x]  No Headaches?: []  Yes    [x]  No Dizziness? []  Yes    [x]  No Comments:   COPD:  "the breathing is fine."  Using generic Advair only in mornings instead of BID.  Using Albuterol 3 x a day She denies any increased cough no shortness of breath.  PAD/aorto iliac occlusive disease: Patient has stable claudication.  She saw Dr. Scot Dock back in December.  ABI of 51% on the right was fairly stable.  On the left ABI was 55% with the previous study in March 2020 showing 60%.  Patient did not express any progression of her symptoms.  He plans to see her back in 1 year with repeat ABIs at that time.  OA shoulder: takesTylenol 500 mg four times a day Patient Active Problem List   Diagnosis Date Noted  . Tubular adenoma of colon 04/27/2019  . Preop cardiovascular exam 01/20/2019  . Cervical radiculopathy 10/01/2018  . Hypertensive retinopathy 09/08/2018  . Seasonal allergies 06/29/2018  . Callous ulcer ([place]) 03/16/2018  . Aortoiliac occlusive disease ([place]) 11/04/2017  . DOE (dyspnea on exertion) 11/03/2017  . Atypical chest pain 11/03/2017  . Ichthyosis 10/06/2017  . Former smoker 04/07/2017  . Primary osteoarthritis of right shoulder 04/07/2017  . Chronic midline low back pain without sciatica 04/07/2017  . Hiatal hernia 05/01/2016  . DJD (degenerative joint disease) of cervical spine 04/17/2016  . History of breast cancer 02/18/2016  . Foot callus 02/18/2016  . Anxiety and depression 05/22/2015  . Insomnia 03/15/2015  . Unspecified vitamin D deficiency 04/08/2013  . Hyperlipidemia with target LDL less than 70 04/08/2013  . Atherosclerotic PVD with intermittent claudication ([place]) 03/22/2013  . Accelerated hypertension 02/23/2013  . GERD (gastroesophageal reflux disease) 02/23/2013  . COPD (chronic obstructive pulmonary disease) ([place]) 02/23/2013     Current Outpatient Medications on File Prior to Visit  Medication Sig Dispense Refill  . albuterol (VENTOLIN HFA) 108 (90 Base) MCG/ACT inhaler Inhale 2 puffs into the lungs every 6 (six) hours as needed. for wheezing 54 g 6  . amLODipine (NORVASC) 10 MG tablet Take 1 tablet (  10 mg total) by mouth at bedtime. 30 tablet 2  . ammonium lactate (LAC-HYDRIN) 12 % cream Apply topically as needed for dry skin. 385 g 0  . aspirin EC 81 MG tablet Take 1 tablet (81 mg total) by mouth daily. 90 tablet 3  . atorvastatin (LIPITOR) 40 MG tablet Take 1 tablet (40 mg total) by mouth daily. 90 tablet 1  . carvedilol (COREG) 6.25 MG tablet Take 1 tablet (6.25 mg total) by mouth 2 (two) times daily. 180 tablet 1  . cyclobenzaprine (FLEXERIL) 5 MG tablet TAKE 1 TABLET TWICE DAILY AS NEEDED FOR MUSCLE SPASM(S) (Patient not taking:  Reported on 04/28/2019) 30 tablet 0  . Fluticasone-Salmeterol (ADVAIR) 100-50 MCG/DOSE AEPB Inhale 1 puff into the lungs 2 (two) times daily. 180 each 3  . gabapentin (NEURONTIN) 300 MG capsule Take 1 capsule (300 mg total) by mouth at bedtime. 90 capsule 2  . hydrALAZINE (APRESOLINE) 10 MG tablet Take 1 tablet (10 mg total) by mouth 2 (two) times daily. 60 tablet 3  . lisinopril (ZESTRIL) 40 MG tablet Take 1 tablet (40 mg total) by mouth daily. 30 tablet 0  . meloxicam (MOBIC) 15 MG tablet TAKE 1 TABLET EVERY DAY 90 tablet 1  . omeprazole (PRILOSEC) 40 MG capsule TAKE 1 CAPSULE (40 MG TOTAL) BY MOUTH DAILY. 90 capsule 1  . Potassium Chloride ER 20 MEQ TBCR 1 tab PO Q Wed and Sat 90 tablet 0  . spironolactone (ALDACTONE) 25 MG tablet Take 1 tablet (25 mg total) by mouth daily. 90 tablet 3   No current facility-administered medications on file prior to visit.    Allergies  Allergen Reactions  . Vicodin [Hydrocodone-Acetaminophen] Nausea And Vomiting and Other (See Comments)    Sweating  . Bactrim [Sulfamethoxazole-Trimethoprim]     HA and vomiting  . Incruse Ellipta [Umeclidinium Bromide]     Dry mouth  . Wellbutrin [Bupropion]     Made me feel bad    Social History   Socioeconomic History  . Marital status: Married    Spouse name: Not on file  . Number of children: Not on file  . Years of education: Not on file  . Highest education level: Not on file  Occupational History  . Not on file  Tobacco Use  . Smoking status: Former Smoker    Packs/day: 0.25    Years: 40.00    Pack years: 10.00    Types: Cigarettes    Quit date: 02/08/2017    Years since quitting: 2.2  . Smokeless tobacco: Never Used  . Tobacco comment: 3 cigarettes per day  Substance and Sexual Activity  . Alcohol use: No  . Drug use: No  . Sexual activity: Not Currently  Other Topics Concern  . Not on file  Social History Narrative  . Not on file   Social Determinants of Health   Financial Resource  Strain:   . Difficulty of Paying Living Expenses: Not on file  Food Insecurity:   . Worried About Charity fundraiser in the Last Year: Not on file  . Ran Out of Food in the Last Year: Not on file  Transportation Needs:   . Lack of Transportation (Medical): Not on file  . Lack of Transportation (Non-Medical): Not on file  Physical Activity:   . Days of Exercise per Week: Not on file  . Minutes of Exercise per Session: Not on file  Stress:   . Feeling of Stress : Not on file  Social  Connections:   . Frequency of Communication with Friends and Family: Not on file  . Frequency of Social Gatherings with Friends and Family: Not on file  . Attends Religious Services: Not on file  . Active Member of Clubs or Organizations: Not on file  . Attends Archivist Meetings: Not on file  . Marital Status: Not on file  Intimate Partner Violence:   . Fear of Current or Ex-Partner: Not on file  . Emotionally Abused: Not on file  . Physically Abused: Not on file  . Sexually Abused: Not on file    Family History  Problem Relation Age of Onset  . Hyperlipidemia Mother   . Hypertension Mother   . Heart disease Mother        Atrial Fib.  . Hyperlipidemia Sister   . Hypertension Sister   . Heart disease Sister        Before age 76,  CHF  . Heart attack Brother   . Hyperlipidemia Brother   . Hypertension Brother   . Heart disease Brother   . Hypertension Daughter     Past Surgical History:  Procedure Laterality Date  . APPENDECTOMY  1980  . BREAST LUMPECTOMY Left    s/p radiation therapy   . BREAST SURGERY    . COLONOSCOPY  07/2015  . COLONOSCOPY WITH PROPOFOL N/A 06/26/2015   Procedure: COLONOSCOPY WITH PROPOFOL;  Surgeon: [name], MD;  Location: [facility];  Service: Endoscopy;  Laterality: N/A;  . CT CTA CORONARY W/CA SCORE W/CM &/OR WO/CM  12/10/2017   Coronary calcium score is 0.  No evidence of CAD.  [name] PERIPHERAL VASCULAR CATHETERIZATION N/A 10/22/2015    Procedure: Abdominal Aortogram w/Lower Extremity;  Surgeon: [name], MD;  Location: [facility] INVASIVE CV LAB: Aortic arch widely patent as far as innominate artery, right subclavian artery, right vertebral artery and right, common carotid.  Bilateral single renal arteries - Nonocclusive disease. Infrarenal aorta 100% - bilateral common Iliac 100% --> B Ex Iliacs fill via Int Iliac. Leg OK  . TRANSTHORACIC ECHOCARDIOGRAM  08/2017   EF 60-65%. No RWMA.  Normal DF.  Normal valves.   . TUBAL LIGATION  1991    ROS: Review of Systems Negative except as stated above  PHYSICAL EXAM: BP 121/80   Pulse 60   Temp 97.7 F (36.5 C)   Resp 16   Wt 187 lb (84.8 kg)   SpO2 97%   BMI 31.12 kg/m   Wt Readings from Last 3 Encounters:  04/28/19 187 lb (84.8 kg)  02/16/19 186 lb 11.2 oz (84.7 kg)  01/21/19 188 lb (85.3 kg)    Physical Exam  General appearance - alert, well appearing, and in no distress Mental status - normal mood, behavior, speech, dress, motor activity, and thought processes Neck - supple, no significant adenopathy Chest - clear to auscultation, no wheezes, rales or rhonchi, symmetric air entry Heart - normal rate, regular rhythm, normal S1, S2, no murmurs, rubs, clicks or gallops Abdomen - soft, nontender, nondistended, no masses or organomegaly Breasts -pendulous with right breast being larger than the left  extremities -trace lower extremity edema   EKG: Reveals sinus bradycardia otherwise no acute changes seen.  CMP Latest Ref Rng & Units 07/06/2018 06/29/2018 03/19/2018  Glucose 65 - 99 mg/dL - 83 -  BUN 8 - 27 mg/dL - 8 -  Creatinine 0.57 - 1.00 mg/dL - 0.63 -  Sodium 134 - 144 mmol/L - 143 -  Potassium 3.5 -  5.2 mmol/L 3.5 3.3(L) -  Chloride 96 - 106 mmol/L - 102 -  CO2 20 - 29 mmol/L - 25 -  Calcium 8.7 - 10.3 mg/dL - 9.9 -  Total Protein 6.0 - 8.5 g/dL - 7.8 7.6  Total Bilirubin 0.0 - 1.2 mg/dL - 0.6 0.4  Alkaline Phos 39 - 117 IU/L - 67 81  AST 0 - 40  IU/L - 20 23  ALT 0 - 32 IU/L - 19 27   Lipid Panel     Component Value Date/Time   CHOL 153 03/19/2018 1024   TRIG 93 03/19/2018 1024   HDL 60 03/19/2018 1024   CHOLHDL 2.6 03/19/2018 1024   CHOLHDL 2.8 07/27/2014 1207   VLDL 32 07/27/2014 1207   LDLCALC 74 03/19/2018 1024    CBC    Component Value Date/Time   WBC 6.6 08/25/2017 0118   RBC 4.22 08/25/2017 0118   HGB 12.2 09/29/2017 0526   HGB 11.4 07/06/2017 1046   HGB 13.8 06/12/2008 0939   HCT 36.0 09/29/2017 0526   HCT 33.2 (L) 07/06/2017 1046   HCT 40.2 06/12/2008 0939   PLT 230 08/25/2017 0118   PLT 191 07/06/2017 1046   MCV 93.6 08/25/2017 0118   MCV 91 07/06/2017 1046   MCV 90.1 06/12/2008 0939   MCH 30.3 08/25/2017 0118   MCHC 32.4 08/25/2017 0118   RDW 13.8 08/25/2017 0118   RDW 14.0 07/06/2017 1046   RDW 13.9 06/12/2008 0939   LYMPHSABS 0.4 (L) 03/10/2017 1335   LYMPHSABS 1.5 06/12/2008 0939   MONOABS 0.5 03/23/2016 2308   MONOABS 0.6 06/12/2008 0939   EOSABS 0.1 03/10/2017 1335   BASOSABS 0.0 03/10/2017 1335   BASOSABS 0.0 06/12/2008 0939    ASSESSMENT AND PLAN: 1. Essential hypertension Blood pressures are much improved.  Patient to continue hydralazine, amlodipine, spironolactone, lisinopril and carvedilol.  Continue low-salt diet - Comprehensive metabolic panel - CBC  2. Chronic obstructive pulmonary disease, unspecified COPD type ([place]) Continue Advair.  However I recommended that she take it twice a day as prescribed and in doing so she may find that she is not having to use the albuterol as often.  3. Atypical chest pain Seems musculoskeletal in nature.  Can use Tylenol as needed.  Alternatively a pain rub can be used.  If no improvement or any worsening she will call me on follow-up. - EKG 12-Lead  4. Aortoiliac occlusive disease ([place]) 5. PAD (peripheral artery disease) ([place]) -Followed by vascular. Patient to continue atorvastatin and aspirin.    Patient was given the opportunity to  ask questions.  Patient verbalized understanding of the plan and was able to repeat key elements of the plan.   No orders of the defined types were placed in this encounter.    Requested Prescriptions    No prescriptions requested or ordered in this encounter    No follow-ups on file.  Karle Plumber, MD, FACP

## 2019-04-29 LAB — CBC
Hematocrit: 35.6 % (ref 34.0–46.6)
Hemoglobin: 12 g/dL (ref 11.1–15.9)
MCH: 31.1 pg (ref 26.6–33.0)
MCHC: 33.7 g/dL (ref 31.5–35.7)
MCV: 92 fL (ref 79–97)
Platelets: 246 10*3/uL (ref 150–450)
RBC: 3.86 x10E6/uL (ref 3.77–5.28)
RDW: 13.2 % (ref 11.7–15.4)
WBC: 4.8 10*3/uL (ref 3.4–10.8)

## 2019-04-29 LAB — LIPID PANEL
Chol/HDL Ratio: 2.1 ratio (ref 0.0–4.4)
Cholesterol, Total: 146 mg/dL (ref 100–199)
HDL: 70 mg/dL (ref >39)
LDL Chol Calc (NIH): 62 mg/dL (ref 0–99)
Triglycerides: 69 mg/dL (ref 0–149)
VLDL Cholesterol Cal: 14 mg/dL (ref 5–40)

## 2019-04-29 LAB — COMPREHENSIVE METABOLIC PANEL
ALT: 45 IU/L — ABNORMAL HIGH (ref 0–32)
AST: 37 IU/L (ref 0–40)
Albumin/Globulin Ratio: 1.4 (ref 1.2–2.2)
Albumin: 4.4 g/dL (ref 3.8–4.8)
Alkaline Phosphatase: 95 IU/L (ref 39–117)
BUN/Creatinine Ratio: 5 — ABNORMAL LOW (ref 12–28)
BUN: 4 mg/dL — ABNORMAL LOW (ref 8–27)
Bilirubin Total: 0.3 mg/dL (ref 0.0–1.2)
CO2: 27 mmol/L (ref 20–29)
Calcium: 9.2 mg/dL (ref 8.7–10.3)
Chloride: 104 mmol/L (ref 96–106)
Creatinine, Ser: 0.79 mg/dL (ref 0.57–1.00)
GFR calc Af Amer: 93 mL/min/{1.73_m2} (ref >59)
GFR calc non Af Amer: 80 mL/min/{1.73_m2} (ref >59)
Globulin, Total: 3.1 g/dL (ref 1.5–4.5)
Glucose: 87 mg/dL (ref 65–99)
Potassium: 3.6 mmol/L (ref 3.5–5.2)
Sodium: 145 mmol/L — ABNORMAL HIGH (ref 134–144)
Total Protein: 7.5 g/dL (ref 6.0–8.5)

## 2019-05-02 ENCOUNTER — Telehealth: Payer: Self-pay | Admitting: Internal Medicine

## 2019-05-02 MED ORDER — NYSTATIN 100000 UNIT/ML MT SUSP: 5.0000 mL | Freq: Four times a day (QID) | OROMUCOSAL | 0 refills | Status: DC

## 2019-05-02 NOTE — Telephone Encounter (Signed)
Patient reports that she noticed some spots on the inside of her cheek.  They initially were white but now they look dark.  She is on generic Advair inhaler, Wixela.  And reports that she rinses her mouth after each use.  I told her that it may be thrush and I will prescribe some nystatin swish and swallow for her.  She will follow-up if no improvement.  Advised to continue to rinse mouth out after each use of Advair.

## 2019-05-02 NOTE — Telephone Encounter (Signed)
Pt called to request a change in her medications, she states -Fluticasone-Salmeterol (ADVAIR) 100-50 MCG/DOSE AEPB Has given her a rash in her mouth, she recently had oral surgery on 03/31/2019 and believes that might also have something to do with the issue. Triage nurse was not available so pt would like a new medication sent to [facility] on [address]  Please advise

## 2019-05-03 ENCOUNTER — Telehealth: Payer: Self-pay | Admitting: Internal Medicine

## 2019-05-03 DIAGNOSIS — I10 Essential (primary) hypertension: Secondary | ICD-10-CM

## 2019-05-03 DIAGNOSIS — J449 Chronic obstructive pulmonary disease, unspecified: Secondary | ICD-10-CM

## 2019-05-03 MED ORDER — CARVEDILOL 6.25 MG PO TABS: 6.2500 mg | ORAL_TABLET | Freq: Two times a day (BID) | ORAL | 0 refills | Status: DC

## 2019-05-03 MED ORDER — FLUTICASONE-SALMETEROL 100-50 MCG/DOSE IN AEPB: 1.0000 | INHALATION_SPRAY | Freq: Two times a day (BID) | RESPIRATORY_TRACT | 0 refills | Status: DC

## 2019-05-03 MED ORDER — HYDRALAZINE HCL 10 MG PO TABS: 10.0000 mg | ORAL_TABLET | Freq: Two times a day (BID) | ORAL | 0 refills | Status: DC

## 2019-05-03 MED ORDER — SPIRONOLACTONE 25 MG PO TABS: 25.0000 mg | ORAL_TABLET | Freq: Every day | ORAL | 0 refills | Status: DC

## 2019-05-03 MED ORDER — LISINOPRIL 40 MG PO TABS: 40.0000 mg | ORAL_TABLET | Freq: Every day | ORAL | 0 refills | Status: DC

## 2019-05-03 MED ORDER — AMLODIPINE BESYLATE 10 MG PO TABS: 10.0000 mg | ORAL_TABLET | Freq: Every day | ORAL | 0 refills | Status: DC

## 2019-05-03 MED ORDER — ATORVASTATIN CALCIUM 40 MG PO TABS: 40.0000 mg | ORAL_TABLET | Freq: Every day | ORAL | 0 refills | Status: DC

## 2019-05-03 MED FILL — hydrALAZINE HCL 10 MG TABS: 10 | 10 days supply | Qty: 20 | Fill #4

## 2019-05-03 NOTE — Telephone Encounter (Signed)
Rxs sent

## 2019-05-03 NOTE — Telephone Encounter (Signed)
Pt called asking for all meds to [facility] pt pharmacy

## 2019-05-03 NOTE — Telephone Encounter (Signed)
Luke may you take care of this  

## 2019-05-29 IMAGING — DX DG SHOULDER 2+V*L*
3 series · 3 of 3 positions shown · non-contrast
Comparison: Chest x-ray of February 20, 2017 which included both
shoulders.

CLINICAL DATA: Chronic bilateral shoulder pain. History of breast
malignancy.

EXAM:
RIGHT SHOULDER - 2+ VIEW; LEFT SHOULDER - 2+ VIEW

[shoulder grashey]
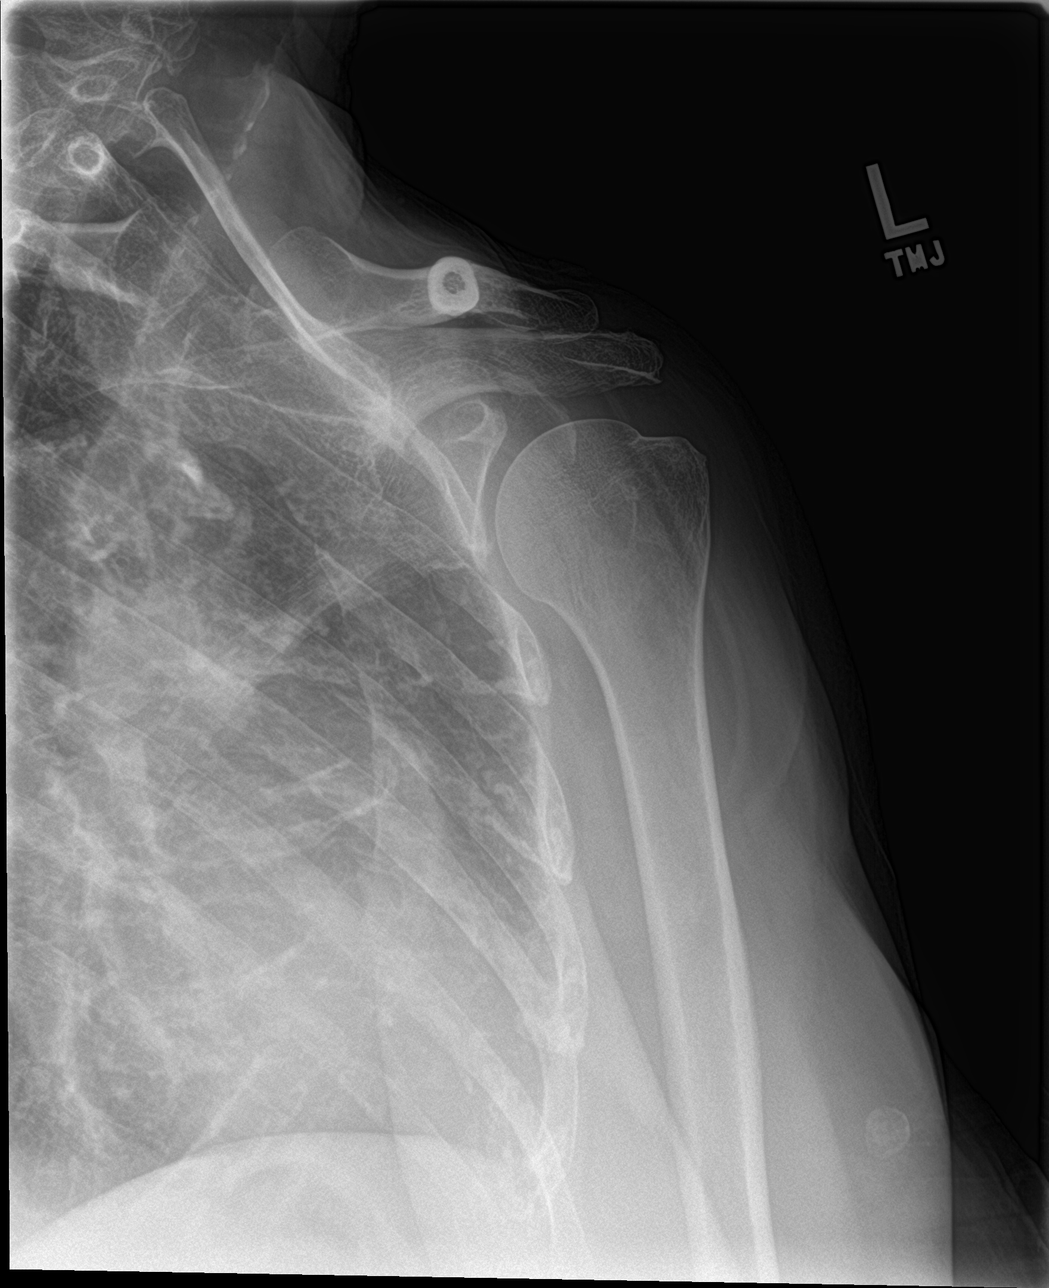

[shoulder y view]
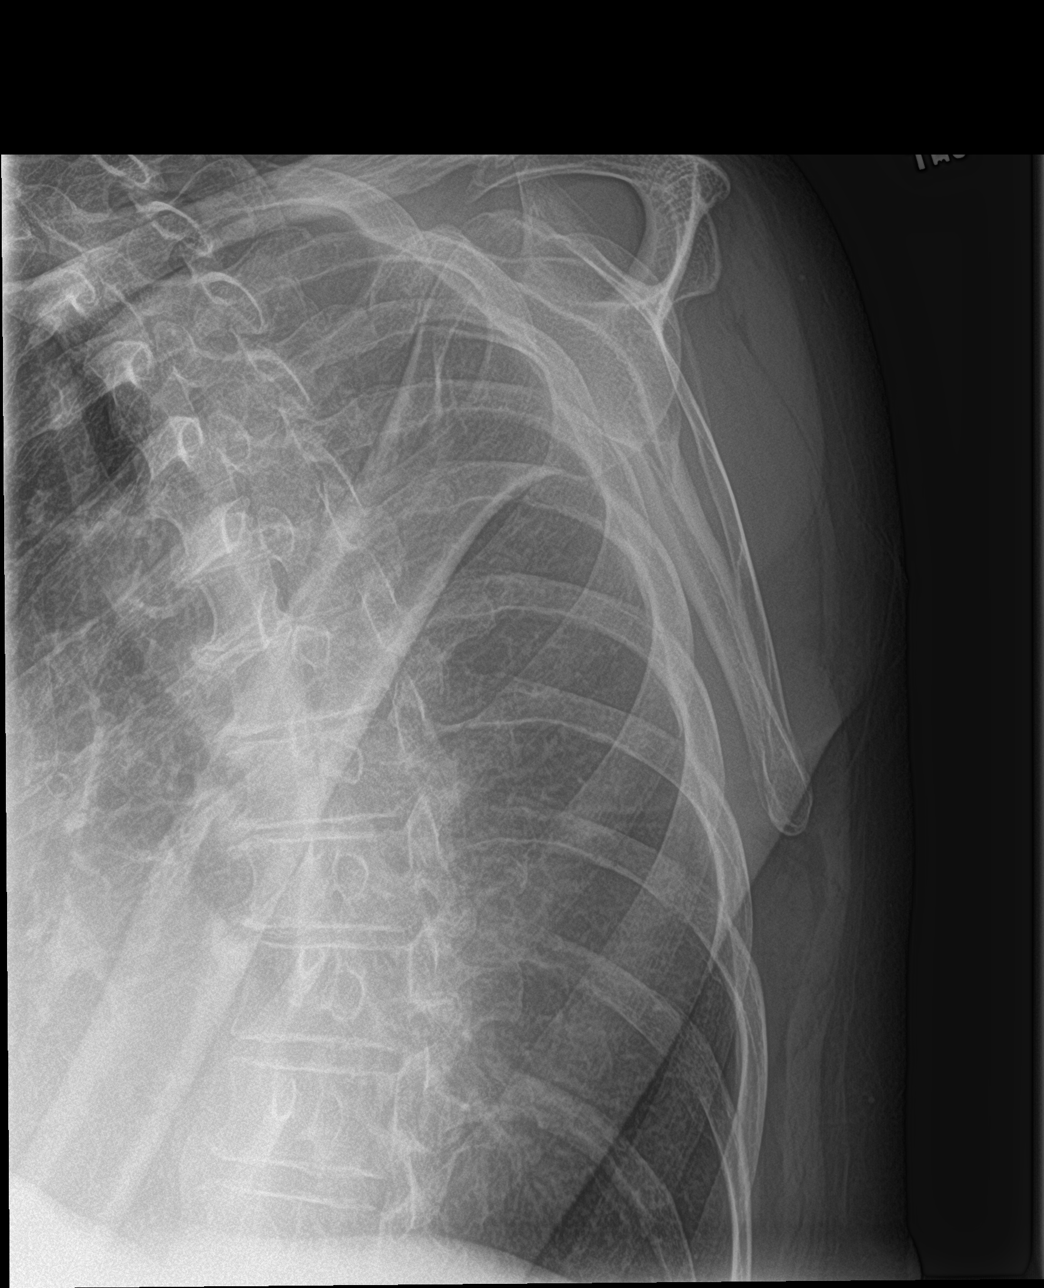

[shoulder axillary]
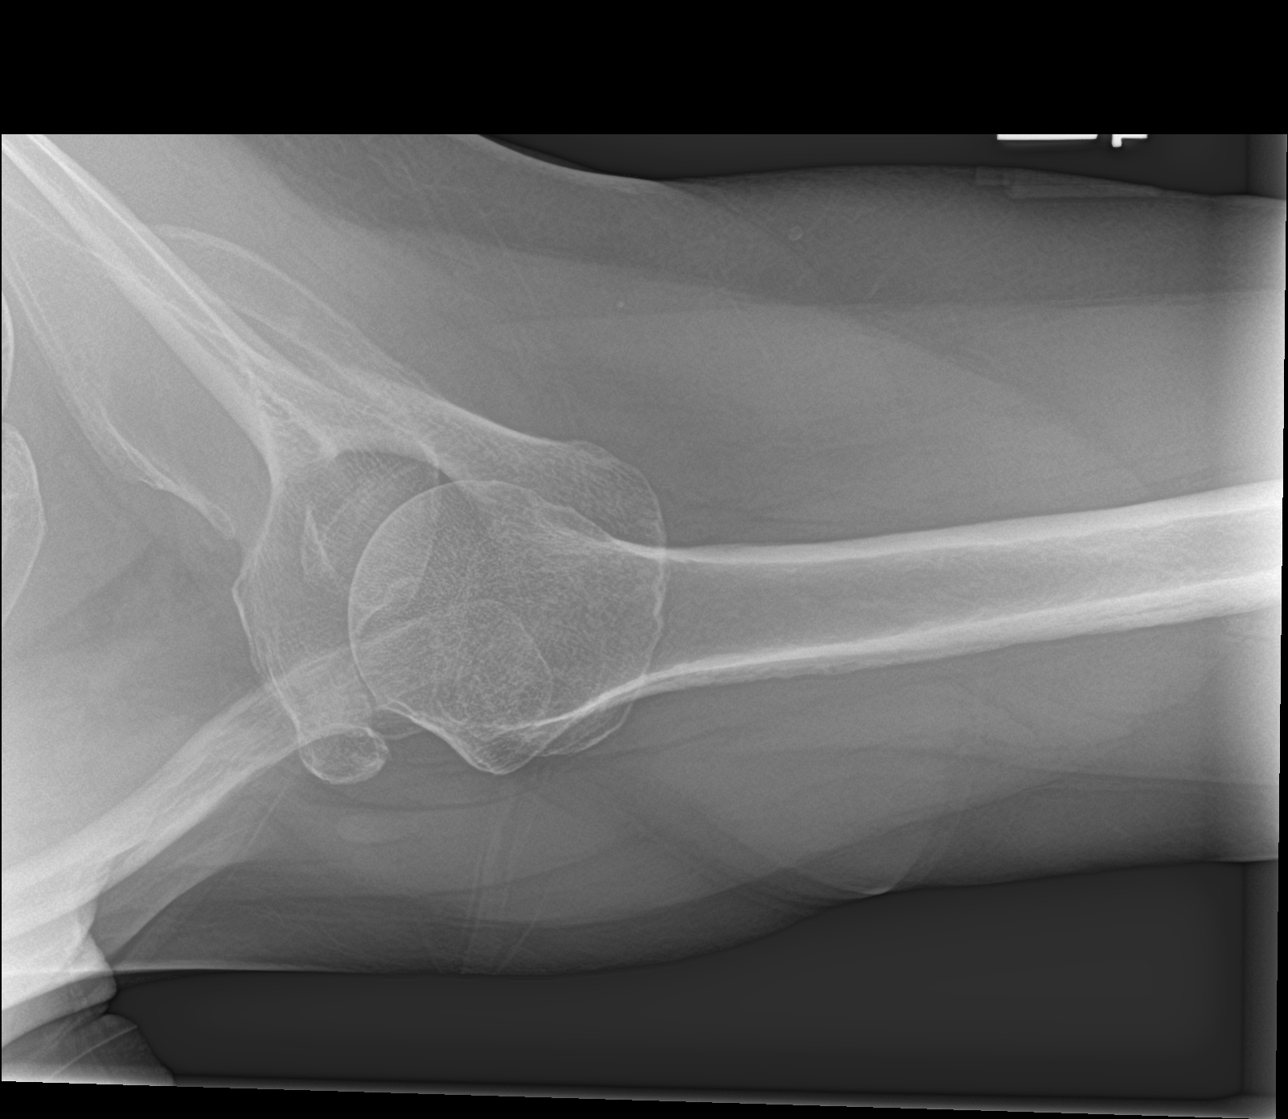

[3 of 3 positions shown; findings below may reference images not displayed]

FINDINGS: Right shoulder: The bones are subjectively adequately mineralized.
There is no acute fracture or dislocation. A small spur arises from
the inferior lip of the glenoid. There is a small subacromial spur.
The observed portions of the right clavicle are normal.

Left shoulder: The bones are subjectively adequately mineralized.
The glenohumeral and AC joint spaces appear normal. The subacromial
subdeltoid space is preserved. There is no significant osteophyte
formation. There is no acute fracture or dislocation. The observed
portions of the left clavicle are normal.
IMPRESSION: There is very mild degenerative bony change principally of the right
shoulder. There is no acute bony abnormality.

## 2019-05-29 IMAGING — DX DG SHOULDER 2+V*R*
3 series · 3 of 3 positions shown · non-contrast
Comparison: Chest x-ray of February 20, 2017 which included both
shoulders.

CLINICAL DATA: Chronic bilateral shoulder pain. History of breast
malignancy.

EXAM:
RIGHT SHOULDER - 2+ VIEW; LEFT SHOULDER - 2+ VIEW

[shoulder grashey]
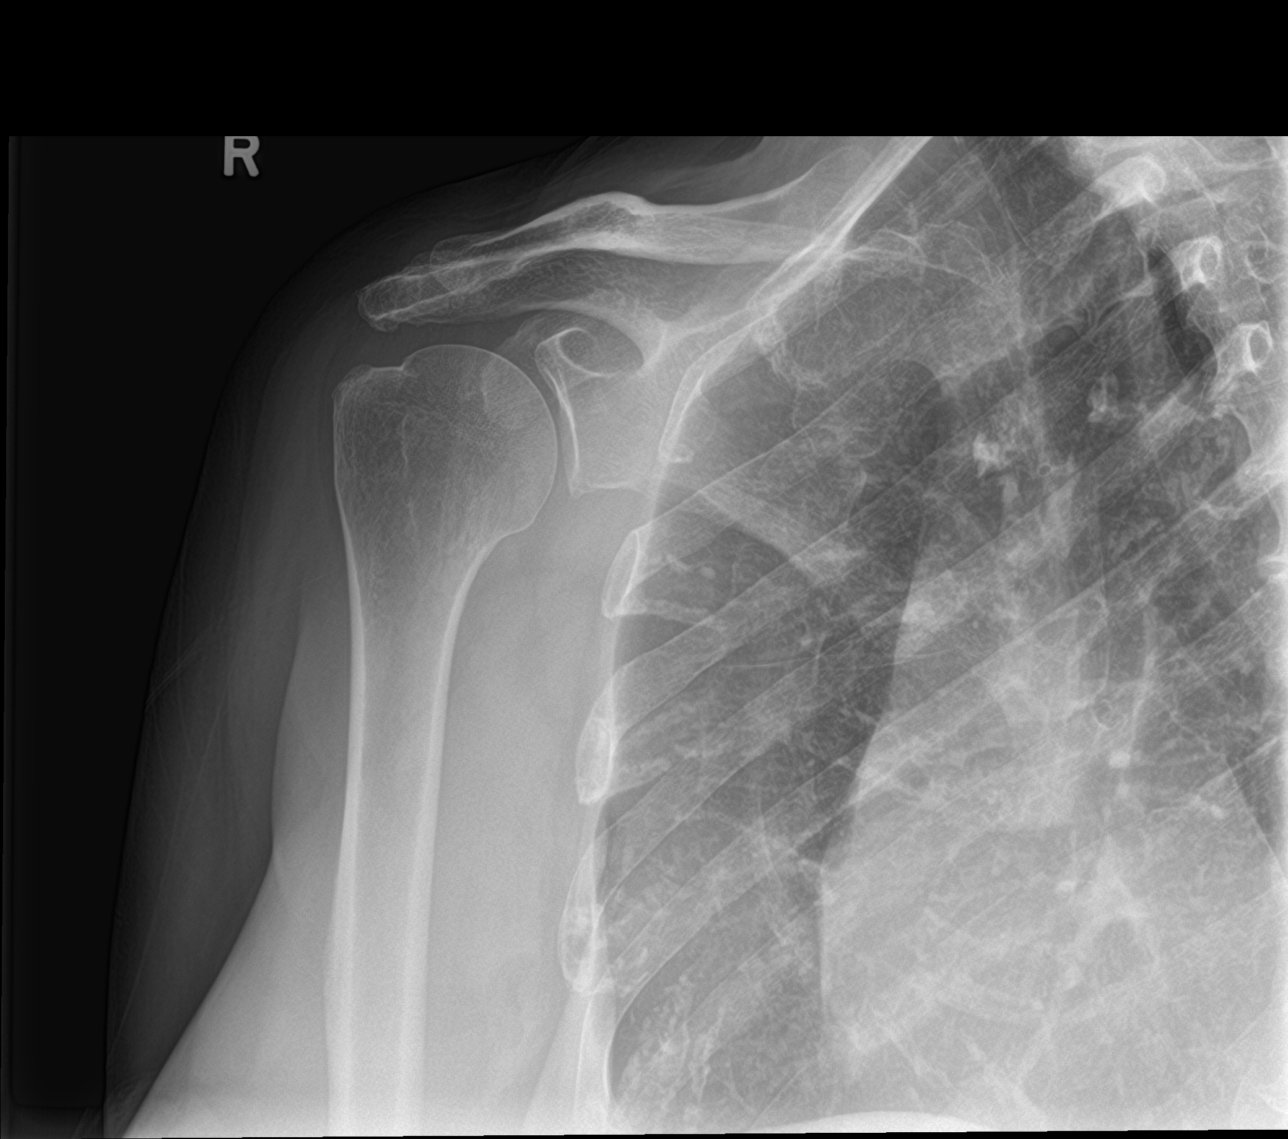

[shoulder y view]
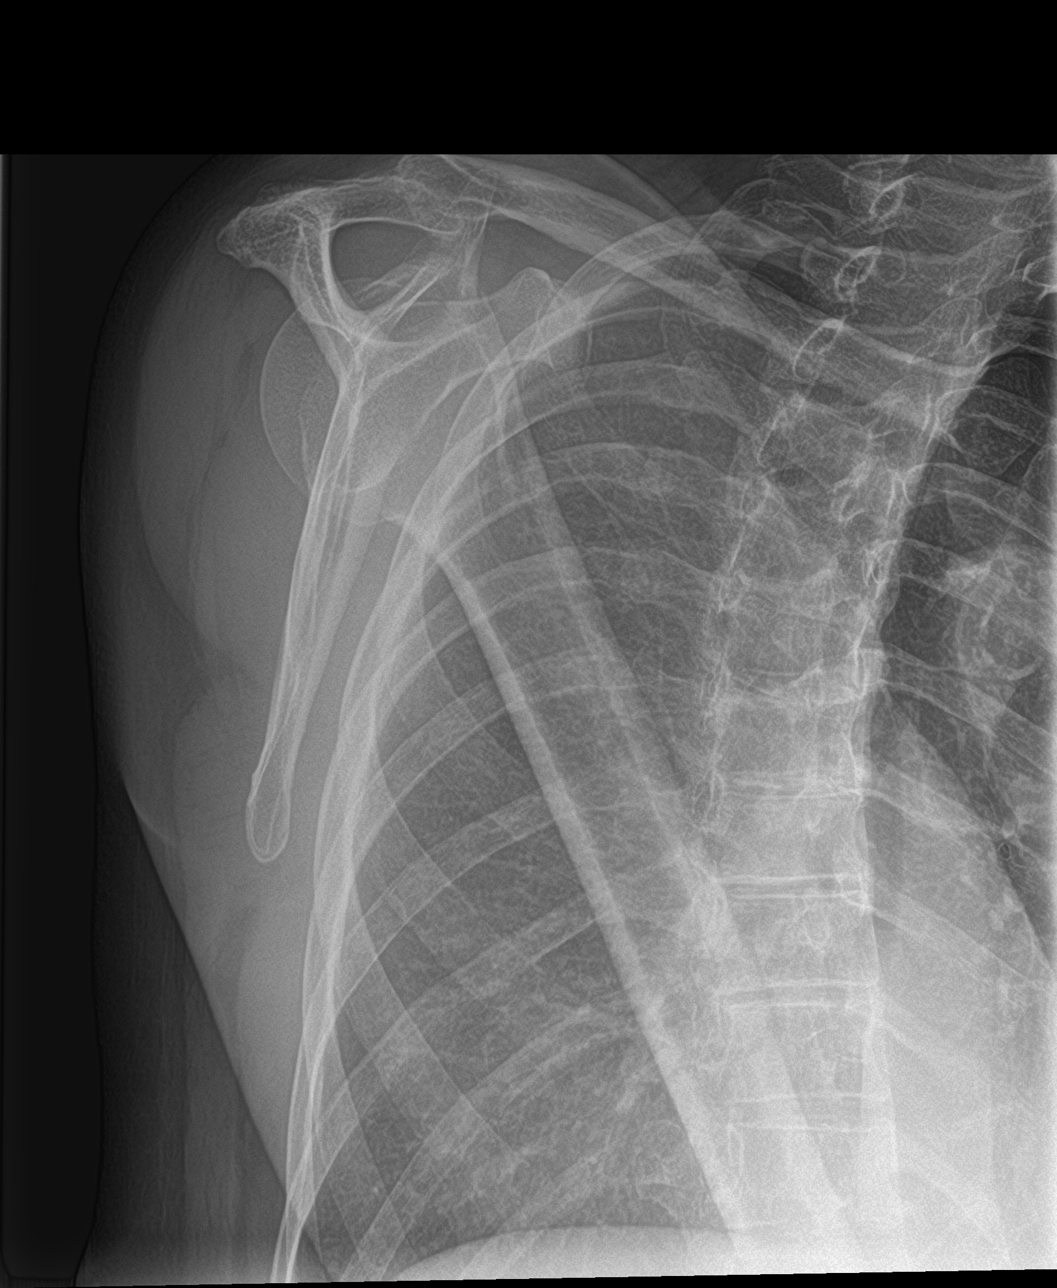

[shoulder axillary]
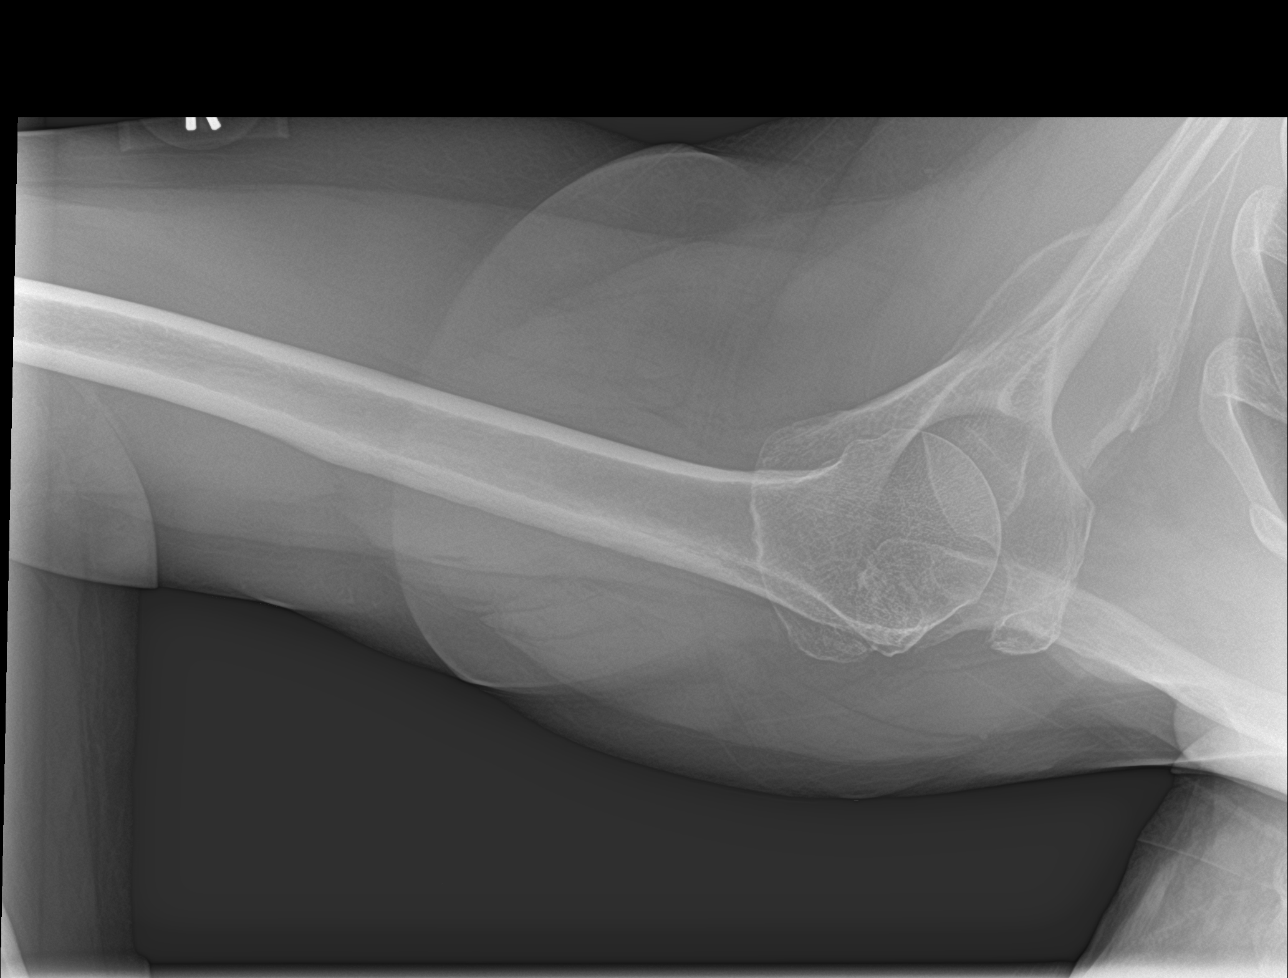

[3 of 3 positions shown; findings below may reference images not displayed]

FINDINGS: Right shoulder: The bones are subjectively adequately mineralized.
There is no acute fracture or dislocation. A small spur arises from
the inferior lip of the glenoid. There is a small subacromial spur.
The observed portions of the right clavicle are normal.

Left shoulder: The bones are subjectively adequately mineralized.
The glenohumeral and AC joint spaces appear normal. The subacromial
subdeltoid space is preserved. There is no significant osteophyte
formation. There is no acute fracture or dislocation. The observed
portions of the left clavicle are normal.
IMPRESSION: There is very mild degenerative bony change principally of the right
shoulder. There is no acute bony abnormality.

## 2019-06-23 ENCOUNTER — Other Ambulatory Visit: Payer: Self-pay | Admitting: Internal Medicine

## 2019-06-23 MED ORDER — NYSTATIN 100000 UNIT/ML MT SUSP: 5.0000 mL | Freq: Four times a day (QID) | OROMUCOSAL | 0 refills | Status: DC

## 2019-07-08 ENCOUNTER — Other Ambulatory Visit: Payer: Self-pay

## 2019-07-08 ENCOUNTER — Encounter: Payer: Self-pay | Admitting: Internal Medicine

## 2019-07-08 ENCOUNTER — Ambulatory Visit: Payer: Medicare HMO | Attending: Internal Medicine | Admitting: Internal Medicine

## 2019-07-08 VITALS — BP 159/84 | HR 57 | Temp 97.3°F | Resp 16 | Wt 182.2 lb

## 2019-07-08 DIAGNOSIS — I739 Peripheral vascular disease, unspecified: Secondary | ICD-10-CM

## 2019-07-08 DIAGNOSIS — I10 Essential (primary) hypertension: Secondary | ICD-10-CM | POA: Diagnosis not present

## 2019-07-08 DIAGNOSIS — R200 Anesthesia of skin: Secondary | ICD-10-CM | POA: Diagnosis not present

## 2019-07-08 MED ORDER — HYDRALAZINE HCL 10 MG PO TABS: 20.0000 mg | ORAL_TABLET | Freq: Two times a day (BID) | ORAL | 1 refills | Status: DC

## 2019-07-08 MED ORDER — SPIRONOLACTONE 25 MG PO TABS: 25.0000 mg | ORAL_TABLET | Freq: Every day | ORAL | 1 refills | Status: DC

## 2019-07-08 NOTE — Patient Instructions (Signed)
Increase hydralazine to 20 mg twice a day. I have given you a prescription for toilet seat extender.  You can take it to any outside medical supplier.

## 2019-07-08 NOTE — Progress Notes (Signed)
Patient ID: Brandy Stevenson, female    DOB: 02-15-1956  MRN: [id number]  CC: HTN and numbness in feet  Subjective: Brandy Stevenson is a 64 y.o. female who presents for chronic ds mnagement Her concerns today include:  Hx of PAD, HTN, HL,formertob dep, LT breast CA (lumpectomy and XRT 2009), GERD, COPD, Trigeminal neuralgia RT, dep/anxiety, OA shoulders.   HTN:  BP elev over last 1 wk.  Not sure why.  She has been mindful of her diet. She brings log:  154/81, 155/91, 166/97, 149/87, 141/81, then this a.m was 136/80 pulses in upper 50-low 60s.  -compliant with meds -not taking potassium supplement.  Suppose to take twice a wk but states she was out and was not sure she was suppose to be taking.  C/o of numbness in the LT leg from the knee to the foot.  Most noticeable when she sits on the toilet. "Feels like its going to sleep."  Occasional pain in leg when she is laying down.  Gabapentin takes care of it. . Use to get cramps in both legs but not lately. Not much pain in calf when she walks.  No numbness in legs with walking. Patient Active Problem List   Diagnosis Date Noted  . Tubular adenoma of colon 04/27/2019  . Preop cardiovascular exam 01/20/2019  . Cervical radiculopathy 10/01/2018  . Hypertensive retinopathy 09/08/2018  . Seasonal allergies 06/29/2018  . Callous ulcer ([place]) 03/16/2018  . Aortoiliac occlusive disease ([place]) 11/04/2017  . DOE (dyspnea on exertion) 11/03/2017  . Atypical chest pain 11/03/2017  . Ichthyosis 10/06/2017  . Former smoker 04/07/2017  . Primary osteoarthritis of right shoulder 04/07/2017  . Chronic midline low back pain without sciatica 04/07/2017  . Hiatal hernia 05/01/2016  . DJD (degenerative joint disease) of cervical spine 04/17/2016  . History of breast cancer 02/18/2016  . Foot callus 02/18/2016  . Anxiety and depression 05/22/2015  . Insomnia 03/15/2015  . Unspecified vitamin D deficiency 04/08/2013  . Hyperlipidemia with  target LDL less than 70 04/08/2013  . Atherosclerotic PVD with intermittent claudication ([place]) 03/22/2013  . Accelerated hypertension 02/23/2013  . GERD (gastroesophageal reflux disease) 02/23/2013  . COPD (chronic obstructive pulmonary disease) ([place]) 02/23/2013     Current Outpatient Medications on File Prior to Visit  Medication Sig Dispense Refill  . albuterol (VENTOLIN HFA) 108 (90 Base) MCG/ACT inhaler Inhale 2 puffs into the lungs every 6 (six) hours as needed. for wheezing 54 g 6  . amLODipine (NORVASC) 10 MG tablet Take 1 tablet (10 mg total) by mouth at bedtime. 90 tablet 0  . ammonium lactate (LAC-HYDRIN) 12 % cream Apply topically as needed for dry skin. 385 g 0  . aspirin EC 81 MG tablet Take 1 tablet (81 mg total) by mouth daily. 90 tablet 3  . atorvastatin (LIPITOR) 40 MG tablet Take 1 tablet (40 mg total) by mouth daily. 90 tablet 0  . carvedilol (COREG) 6.25 MG tablet Take 1 tablet (6.25 mg total) by mouth 2 (two) times daily. 180 tablet 0  . cyclobenzaprine (FLEXERIL) 5 MG tablet TAKE 1 TABLET TWICE DAILY AS NEEDED FOR MUSCLE SPASM(S) (Patient not taking: Reported on 04/28/2019) 30 tablet 0  . Fluticasone-Salmeterol (ADVAIR) 100-50 MCG/DOSE AEPB Inhale 1 puff into the lungs 2 (two) times daily. 180 each 0  . gabapentin (NEURONTIN) 300 MG capsule Take 1 capsule (300 mg total) by mouth at bedtime. 90 capsule 2  . lisinopril (ZESTRIL) 40 MG tablet Take 1 tablet (40  mg total) by mouth daily. 90 tablet 0  . meloxicam (MOBIC) 15 MG tablet TAKE 1 TABLET EVERY DAY 90 tablet 1  . nystatin (MYCOSTATIN) 100000 UNIT/ML suspension Take 5 mLs (500,000 Units total) by mouth 4 (four) times daily. Swish and swallow 100 mL 0  . omeprazole (PRILOSEC) 40 MG capsule TAKE 1 CAPSULE (40 MG TOTAL) BY MOUTH DAILY. 90 capsule 1  . Potassium Chloride ER 20 MEQ TBCR 1 tab PO Q Wed and Sat 90 tablet 0   No current facility-administered medications on file prior to visit.    Allergies  Allergen  Reactions  . Vicodin [Hydrocodone-Acetaminophen] Nausea And Vomiting and Other (See Comments)    Sweating  . Bactrim [Sulfamethoxazole-Trimethoprim]     HA and vomiting  . Incruse Ellipta [Umeclidinium Bromide]     Dry mouth  . Wellbutrin [Bupropion]     Made me feel bad    Social History   Socioeconomic History  . Marital status: Married    Spouse name: Not on file  . Number of children: Not on file  . Years of education: Not on file  . Highest education level: Not on file  Occupational History  . Not on file  Tobacco Use  . Smoking status: Former Smoker    Packs/day: 0.25    Years: 40.00    Pack years: 10.00    Types: Cigarettes    Quit date: 02/08/2017    Years since quitting: 2.4  . Smokeless tobacco: Never Used  . Tobacco comment: 3 cigarettes per day  Substance and Sexual Activity  . Alcohol use: No  . Drug use: No  . Sexual activity: Not Currently  Other Topics Concern  . Not on file  Social History Narrative  . Not on file   Social Determinants of Health   Financial Resource Strain:   . Difficulty of Paying Living Expenses:   Food Insecurity:   . Worried About Charity fundraiser in the Last Year:   . Arboriculturist in the Last Year:   Transportation Needs:   . Film/video editor (Medical):   [name] Lack of Transportation (Non-Medical):   Physical Activity:   . Days of Exercise per Week:   . Minutes of Exercise per Session:   Stress:   . Feeling of Stress :   Social Connections:   . Frequency of Communication with Friends and Family:   . Frequency of Social Gatherings with Friends and Family:   . Attends Religious Services:   . Active Member of Clubs or Organizations:   . Attends Archivist Meetings:   [name] Marital Status:   Intimate Partner Violence:   . Fear of Current or Ex-Partner:   . Emotionally Abused:   [name] Physically Abused:   . Sexually Abused:     Family History  Problem Relation Age of Onset  . Hyperlipidemia Mother   .  Hypertension Mother   . Heart disease Mother        Atrial Fib.  . Hyperlipidemia Sister   . Hypertension Sister   . Heart disease Sister        Before age 20,  CHF  . Heart attack Brother   . Hyperlipidemia Brother   . Hypertension Brother   . Heart disease Brother   . Hypertension Daughter     Past Surgical History:  Procedure Laterality Date  . APPENDECTOMY  1980  . BREAST LUMPECTOMY Left    s/p radiation therapy   .  BREAST SURGERY    . COLONOSCOPY  07/2015  . COLONOSCOPY WITH PROPOFOL N/A 06/26/2015   Procedure: COLONOSCOPY WITH PROPOFOL;  Surgeon: [name], MD;  Location: [facility];  Service: Endoscopy;  Laterality: N/A;  . CT CTA CORONARY W/CA SCORE W/CM &/OR WO/CM  12/10/2017   Coronary calcium score is 0.  No evidence of CAD.  [name] PERIPHERAL VASCULAR CATHETERIZATION N/A 10/22/2015   Procedure: Abdominal Aortogram w/Lower Extremity;  Surgeon: [name], MD;  [facility]: [facility] INVASIVE CV LAB: Aortic arch widely patent as far as innominate artery, right subclavian artery, right vertebral artery and right, common carotid.  Bilateral single renal arteries - Nonocclusive disease. Infrarenal aorta 100% - bilateral common Iliac 100% --> B Ex Iliacs fill via Int Iliac. Leg OK  . TRANSTHORACIC ECHOCARDIOGRAM  08/2017   EF 60-65%. No RWMA.  Normal DF.  Normal valves.   . TUBAL LIGATION  1991    ROS: Review of Systems Negative except as stated above  PHYSICAL EXAM: BP (!) 159/84   Pulse (!) 57   Temp (!) 97.3 F (36.3 C)   Resp 16   Wt 182 lb 3.2 oz (82.6 kg)   SpO2 99%   BMI 30.32 kg/m   Physical Exam  General appearance - alert, well appearing, and in no distress Chest - clear to auscultation, no wheezes, rales or rhonchi, symmetric air entry Heart - normal rate, regular rhythm, normal S1, S2, no murmurs, rubs, clicks or gallops Extremities -no lower extremity edema.  Legs and feet are warm.  Spares hair on the lower legs.  Popliteal pulses are reduced  bilaterally.  Dorsalis pedis and posterior tibialis pulses are 1 bilaterally.  No cyanosis seen in the feet.  CMP Latest Ref Rng & Units 04/28/2019 07/06/2018 06/29/2018  Glucose 65 - 99 mg/dL 87 - 83  BUN 8 - 27 mg/dL 4(L) - 8  Creatinine 0.57 - 1.00 mg/dL 0.79 - 0.63  Sodium 134 - 144 mmol/L 145(H) - 143  Potassium 3.5 - 5.2 mmol/L 3.6 3.5 3.3(L)  Chloride 96 - 106 mmol/L 104 - 102  CO2 20 - 29 mmol/L 27 - 25  Calcium 8.7 - 10.3 mg/dL 9.2 - 9.9  Total Protein 6.0 - 8.5 g/dL 7.5 - 7.8  Total Bilirubin 0.0 - 1.2 mg/dL 0.3 - 0.6  Alkaline Phos 39 - 117 IU/L 95 - 67  AST 0 - 40 IU/L 37 - 20  ALT 0 - 32 IU/L 45(H) - 19   Lipid Panel     Component Value Date/Time   CHOL 146 04/28/2019 1222   TRIG 69 04/28/2019 1222   HDL 70 04/28/2019 1222   CHOLHDL 2.1 04/28/2019 1222   CHOLHDL 2.8 07/27/2014 1207   VLDL 32 07/27/2014 1207   LDLCALC 62 04/28/2019 1222    CBC    Component Value Date/Time   WBC 4.8 04/28/2019 1222   WBC 6.6 08/25/2017 0118   RBC 3.86 04/28/2019 1222   RBC 4.22 08/25/2017 0118   HGB 12.0 04/28/2019 1222   HGB 13.8 06/12/2008 0939   HCT 35.6 04/28/2019 1222   HCT 40.2 06/12/2008 0939   PLT 246 04/28/2019 1222   MCV 92 04/28/2019 1222   MCV 90.1 06/12/2008 0939   MCH 31.1 04/28/2019 1222   MCH 30.3 08/25/2017 0118   MCHC 33.7 04/28/2019 1222   MCHC 32.4 08/25/2017 0118   RDW 13.2 04/28/2019 1222   RDW 13.9 06/12/2008 0939   LYMPHSABS 0.4 (L) 03/10/2017 1335  LYMPHSABS 1.5 06/12/2008 0939   MONOABS 0.5 03/23/2016 2308   MONOABS 0.6 06/12/2008 0939   EOSABS 0.1 03/10/2017 1335   BASOSABS 0.0 03/10/2017 1335   BASOSABS 0.0 06/12/2008 0939    ASSESSMENT AND PLAN: 1. Essential hypertension Not at goal.  Increase hydralazine to 20 mg twice a day.  We will check potassium level today to see whether we need to restart the potassium which I think we will need to do.  I will let her know once resulted.  She will continue to monitor blood pressure -  Potassium - hydrALAZINE (APRESOLINE) 10 MG tablet; Take 2 tablets (20 mg total) by mouth 2 (two) times daily.  Dispense: 360 tablet; Refill: 1 - spironolactone (ALDACTONE) 25 MG tablet; Take 1 tablet (25 mg total) by mouth daily.  Dispense: 90 tablet; Refill: 1  2. Numbness in left leg -Likely circulation or nerve compression.  However since it occurs only when she sits on the toilet, I have given her a prescription to get a toilet seat booster to see whether this would make a difference.  If it does not we probably will have her follow-up with vein and vascular  3. PAD (peripheral artery disease) ([place])   Patient was given the opportunity to ask questions.  Patient verbalized understanding of the plan and was able to repeat key elements of the plan.   Orders Placed This Encounter  Procedures  . Potassium     Requested Prescriptions   Signed Prescriptions Disp Refills  . hydrALAZINE (APRESOLINE) 10 MG tablet 360 tablet 1    Sig: Take 2 tablets (20 mg total) by mouth 2 (two) times daily.  [name] spironolactone (ALDACTONE) 25 MG tablet 90 tablet 1    Sig: Take 1 tablet (25 mg total) by mouth daily.    No follow-ups on file.  Karle Plumber, MD, FACP

## 2019-07-09 ENCOUNTER — Other Ambulatory Visit: Payer: Self-pay | Admitting: Physician Assistant

## 2019-07-09 ENCOUNTER — Other Ambulatory Visit: Payer: Self-pay | Admitting: Internal Medicine

## 2019-07-09 LAB — POTASSIUM: Potassium: 3.7 mmol/L (ref 3.5–5.2)

## 2019-07-19 ENCOUNTER — Telehealth: Payer: Self-pay | Admitting: Internal Medicine

## 2019-07-19 DIAGNOSIS — R200 Anesthesia of skin: Secondary | ICD-10-CM | POA: Diagnosis not present

## 2019-07-19 NOTE — Telephone Encounter (Signed)
Medical supply called in requesting for a diagnosis code for the patients toilet seat. Please follow up at your earliest convenience.

## 2019-07-21 NOTE — Telephone Encounter (Signed)
Alycia provided dx code R20.0 Numbness in legs

## 2019-08-04 DIAGNOSIS — C50912 Malignant neoplasm of unspecified site of left female breast: Secondary | ICD-10-CM | POA: Diagnosis not present

## 2019-08-16 DIAGNOSIS — C50912 Malignant neoplasm of unspecified site of left female breast: Secondary | ICD-10-CM | POA: Diagnosis not present

## 2019-08-19 DIAGNOSIS — H52223 Regular astigmatism, bilateral: Secondary | ICD-10-CM | POA: Diagnosis not present

## 2019-08-19 DIAGNOSIS — H0288B Meibomian gland dysfunction left eye, upper and lower eyelids: Secondary | ICD-10-CM | POA: Diagnosis not present

## 2019-08-19 DIAGNOSIS — H35033 Hypertensive retinopathy, bilateral: Secondary | ICD-10-CM | POA: Diagnosis not present

## 2019-08-19 DIAGNOSIS — H5203 Hypermetropia, bilateral: Secondary | ICD-10-CM | POA: Diagnosis not present

## 2019-08-19 DIAGNOSIS — H0102B Squamous blepharitis left eye, upper and lower eyelids: Secondary | ICD-10-CM | POA: Diagnosis not present

## 2019-08-19 DIAGNOSIS — H0288A Meibomian gland dysfunction right eye, upper and lower eyelids: Secondary | ICD-10-CM | POA: Diagnosis not present

## 2019-08-19 DIAGNOSIS — H0102A Squamous blepharitis right eye, upper and lower eyelids: Secondary | ICD-10-CM | POA: Diagnosis not present

## 2019-08-19 DIAGNOSIS — H04123 Dry eye syndrome of bilateral lacrimal glands: Secondary | ICD-10-CM | POA: Diagnosis not present

## 2019-08-19 DIAGNOSIS — H2513 Age-related nuclear cataract, bilateral: Secondary | ICD-10-CM | POA: Diagnosis not present

## 2019-08-23 DIAGNOSIS — C50912 Malignant neoplasm of unspecified site of left female breast: Secondary | ICD-10-CM | POA: Diagnosis not present

## 2019-08-30 ENCOUNTER — Ambulatory Visit: Payer: Medicare HMO | Admitting: Internal Medicine

## 2019-08-30 ENCOUNTER — Other Ambulatory Visit: Payer: Self-pay

## 2019-08-30 ENCOUNTER — Ambulatory Visit: Payer: Medicare HMO | Attending: Internal Medicine | Admitting: Internal Medicine

## 2019-08-30 ENCOUNTER — Encounter: Payer: Self-pay | Admitting: Internal Medicine

## 2019-08-30 VITALS — BP 121/78 | HR 57 | Temp 98.1°F | Resp 16 | Ht 65.0 in | Wt 184.0 lb

## 2019-08-30 DIAGNOSIS — R6884 Jaw pain: Secondary | ICD-10-CM

## 2019-08-30 DIAGNOSIS — Z0000 Encounter for general adult medical examination without abnormal findings: Secondary | ICD-10-CM

## 2019-08-30 DIAGNOSIS — Z7189 Other specified counseling: Secondary | ICD-10-CM

## 2019-08-30 DIAGNOSIS — Z1331 Encounter for screening for depression: Secondary | ICD-10-CM | POA: Diagnosis not present

## 2019-08-30 DIAGNOSIS — Z23 Encounter for immunization: Secondary | ICD-10-CM | POA: Diagnosis not present

## 2019-08-30 DIAGNOSIS — Z6830 Body mass index (BMI) 30.0-30.9, adult: Secondary | ICD-10-CM | POA: Diagnosis not present

## 2019-08-30 DIAGNOSIS — I739 Peripheral vascular disease, unspecified: Secondary | ICD-10-CM | POA: Diagnosis not present

## 2019-08-30 DIAGNOSIS — E6609 Other obesity due to excess calories: Secondary | ICD-10-CM | POA: Diagnosis not present

## 2019-08-30 MED ORDER — CILOSTAZOL 50 MG PO TABS: 50.0000 mg | ORAL_TABLET | Freq: Two times a day (BID) | ORAL | 1 refills | Status: DC

## 2019-08-30 MED FILL — CILOSTAZOL 50 MG TABLET: 50 | 30 days supply | Qty: 60 | Fill #0

## 2019-08-30 NOTE — Patient Instructions (Signed)
Please speak with your dentist again.  I think the pain that you have on the right jaw is due to mild fitting dentures.  I will submit a referral for you to follow-up with your vascular surgeon Dr. Harrell Gave about the pains that you are having in your legs.  We have started you on a new medication called Pletal.  Please take a look at the information that I gave you about advanced directive.  If you decide to execute one, please bring a copy for our records on your next visit.  We will discuss lung cancer screening again on your follow-up visit.  Please get the COVID-19 vaccine as discussed.  We will give the shingles vaccine on your next visit if you decide that you want it.

## 2019-08-30 NOTE — Progress Notes (Signed)
Subjective:   Brandy Stevenson is a 64 y.o. female who presents for an Initial Medicare Annual Wellness Visit. Hx of PAD, HTN, HL,formertob dep, LT breast CA (lumpectomy and XRT 2009), GERD, COPD, Trigeminal neuralgia RT, dep/anxiety, OA shoulders  Review of Systems    Patient recently got her dentures.  However she feels that the dentures are ill fitting and went back to the dentist who adjusted them.  However she still feels that something is not right because when she puts the dentures on she gets swelling over the upper cheekbone on both sides.  No pain with chewing.  No pain over the TMJ.  The left cheekbone is no longer swollen but she feels that the right gets swollen whenever she puts in her dentures.  She spoke with her dentist again and he told her to     Objective:    Today's Vitals   08/30/19 1029  BP: 121/78  Pulse: (!) 57  Resp: 16  Temp: 98.1 F (36.7 C)  SpO2: 98%  Weight: 184 lb (83.5 kg)  Height: 5\' 5"  (1.651 m)  PainSc: 0-No pain   Wt Readings from Last 3 Encounters:  08/30/19 184 lb (83.5 kg)  07/08/19 182 lb 3.2 oz (82.6 kg)  04/28/19 187 lb (84.8 kg)    Body mass index is 30.62 kg/m. General: Older African-American female in NAD Eyes: Pink conjunctiva.  Nonicteric sclera Nose: No enlargement of nasal turbinates Mouth: Patient is edentulous.  Gum appears well-healed.  Slight soft tissue swelling over the right cheekbone Neck: No cervical lymphadenopathy.  No neck masses Chest: Lungs clear to auscultation without wheezes or crackles CVS: Regular rate rhythm.  No gallops or murmurs heard Extremities: Legs are warm.  No discoloration seen in the lower extremities.  Dorsalis pedis and posterior tibialis pulses 2+ on the right and 1+ on the left.  Decreased popliteal pulses bilaterally.  No lower extremity edema Advanced Directives 08/30/2019 08/30/2019 04/28/2019 02/16/2019 05/05/2018 10/19/2017 09/29/2017  Does Patient Have a Medical Advance  Directive? No No No No No No No  Would patient like information on creating a medical advance directive? Yes (MAU/Ambulatory/Procedural Areas - Information given) - No - Patient declined No - Patient declined No - Patient declined - No - Patient declined  Pre-existing out of facility DNR order (yellow form or pink MOST form) - - - - - - -  Discussed with patient what is an advanced directive.  I went over with her living will and healthcare power of attorney.  Patient given written packet also.  She will look over it and consider executing a living will.  If she does, I requested that she brings a copy of it for our records.  Current Medications (verified) Outpatient Encounter Medications as of 08/30/2019  Medication Sig  . albuterol (VENTOLIN HFA) 108 (90 Base) MCG/ACT inhaler Inhale 2 puffs into the lungs every 6 (six) hours as needed. for wheezing  . amLODipine (NORVASC) 10 MG tablet Take 1 tablet (10 mg total) by mouth at bedtime.  [name] ammonium lactate (LAC-HYDRIN) 12 % cream Apply topically as needed for dry skin.  [name] aspirin EC 81 MG tablet Take 1 tablet (81 mg total) by mouth daily.  [name] atorvastatin (LIPITOR) 40 MG tablet Take 1 tablet (40 mg total) by mouth daily.  . carvedilol (COREG) 6.25 MG tablet Take 1 tablet (6.25 mg total) by mouth 2 (two) times daily.  . Fluticasone-Salmeterol (ADVAIR) 100-50 MCG/DOSE AEPB Inhale 1 puff into the  lungs 2 (two) times daily.  [name] gabapentin (NEURONTIN) 300 MG capsule Take 1 capsule (300 mg total) by mouth at bedtime.  . hydrALAZINE (APRESOLINE) 10 MG tablet Take 2 tablets (20 mg total) by mouth 2 (two) times daily.  [name] lisinopril (ZESTRIL) 40 MG tablet Take 1 tablet (40 mg total) by mouth daily.  . meloxicam (MOBIC) 15 MG tablet TAKE 1 TABLET EVERY DAY  . nystatin (MYCOSTATIN) 100000 UNIT/ML suspension Take 5 mLs (500,000 Units total) by mouth 4 (four) times daily. Swish and swallow  . omeprazole (PRILOSEC) 40 MG capsule TAKE 1 CAPSULE (40 MG TOTAL) BY MOUTH  DAILY.  [name] spironolactone (ALDACTONE) 25 MG tablet Take 1 tablet (25 mg total) by mouth daily.  . cilostazol (PLETAL) 50 MG tablet Take 1 tablet (50 mg total) by mouth 2 (two) times daily.  . cyclobenzaprine (FLEXERIL) 5 MG tablet TAKE 1 TABLET TWICE DAILY AS NEEDED FOR MUSCLE SPASM(S) (Patient not taking: Reported on 04/28/2019)   No facility-administered encounter medications on file as of 08/30/2019.    Allergies (verified) Vicodin [hydrocodone-acetaminophen], Bactrim [sulfamethoxazole-trimethoprim], Incruse ellipta [umeclidinium bromide], and Wellbutrin [bupropion]   History: Past Medical History:  Diagnosis Date  . Arthritis    back problems to be evaluated by neurology next month  . Cancer ([place]) 2009   L, Breast, radiation and surgery.  [name] COPD (chronic obstructive pulmonary disease) ([place])   . Depression   . GERD (gastroesophageal reflux disease)   . Hyperlipemia   . Hypertension   . Leriche syndrome ([place])    Infrarenal aortic occlusion extending to bilateral internal iliac arteries.  External iliacs fill via collaterals from lumbar arteries to hypogastric/internal iliac arteries -minimal disease on the right side and no disease on the left side leg arteries distally.  . Peripheral vascular disease ([place]) 2013   Past Surgical History:  Procedure Laterality Date  . APPENDECTOMY  1980  . BREAST LUMPECTOMY Left    s/p radiation therapy   . BREAST SURGERY    . COLONOSCOPY  07/2015  . COLONOSCOPY WITH PROPOFOL N/A 06/26/2015   Procedure: COLONOSCOPY WITH PROPOFOL;  Surgeon: [name], MD;  Location: [facility] ENDOSCOPY;  Service: Endoscopy;  Laterality: N/A;  . CT CTA CORONARY W/CA SCORE W/CM &/OR WO/CM  12/10/2017   Coronary calcium score is 0.  No evidence of CAD.  [name] PERIPHERAL VASCULAR CATHETERIZATION N/A 10/22/2015   Procedure: Abdominal Aortogram w/Lower Extremity;  Surgeon: [name], MD;  Location: [facility] INVASIVE CV LAB: Aortic arch widely patent as far as innominate  artery, right subclavian artery, right vertebral artery and right, common carotid.  Bilateral single renal arteries - Nonocclusive disease. Infrarenal aorta 100% - bilateral common Iliac 100% --> B Ex Iliacs fill via Int Iliac. Leg OK  . TRANSTHORACIC ECHOCARDIOGRAM  08/2017   EF 60-65%. No RWMA.  Normal DF.  Normal valves.   . TUBAL LIGATION  1991   Family History  Problem Relation Age of Onset  . Hyperlipidemia Mother   . Hypertension Mother   . Heart disease Mother        Atrial Fib.  . Hyperlipidemia Sister   . Hypertension Sister   . Heart disease Sister        Before age 47,  CHF  . Heart attack Brother   . Hyperlipidemia Brother   . Hypertension Brother   . Heart disease Brother   . Hypertension Daughter    Social History   Socioeconomic History  . Marital status: Married  Spouse name: Not on file  . Number of children: Not on file  . Years of education: Not on file  . Highest education level: Not on file  Occupational History  . Not on file  Tobacco Use  . Smoking status: Former Smoker    Packs/day: 0.25    Years: 40.00    Pack years: 10.00    Types: Cigarettes    Quit date: 02/08/2017    Years since quitting: 2.5  . Smokeless tobacco: Never Used  . Tobacco comment: 3 cigarettes per day  Vaping Use  . Vaping Use: Never used  Substance and Sexual Activity  . Alcohol use: No  . Drug use: No  . Sexual activity: Yes  Other Topics Concern  . Not on file  Social History Narrative  . Not on file   Social Determinants of Health   Financial Resource Strain:   . Difficulty of Paying Living Expenses:   Food Insecurity:   . Worried About Charity fundraiser in the Last Year:   . Arboriculturist in the Last Year:   Transportation Needs:   . Film/video editor (Medical):   [name] Lack of Transportation (Non-Medical):   Physical Activity:   . Days of Exercise per Week:   . Minutes of Exercise per Session:   Stress:   . Feeling of Stress :   Social  Connections:   . Frequency of Communication with Friends and Family:   . Frequency of Social Gatherings with Friends and Family:   . Attends Religious Services:   . Active Member of Clubs or Organizations:   . Attends Archivist Meetings:   [name] Marital Status:     Tobacco Counseling Former smoker.  Quit 01/2018  Clinical Intake:  Pre-visit preparation completed: Yes  Pain : No/denies pain Pain Score: 0-No pain  Activities of Daily Living In your present state of health, do you have any difficulty performing the following activities: 08/30/2019  Hearing? N  Vision? Y  Comment waiting reading glasses  Difficulty concentrating or making decisions? N  Walking or climbing stairs? Y  Comment uses a cane sometimes  Dressing or bathing? N  Doing errands, shopping? N  Preparing Food and eating ? N  Using the Toilet? N  In the past six months, have you accidently leaked urine? N  Do you have problems with loss of bowel control? N  Managing your Medications? N  Managing your Finances? N  Housekeeping or managing your Housekeeping? N  Some recent data might be hidden  -Saw Dr. Katy Fitch 2 wks ago. Prescribed new lenses which are in the process of being made.  She has only OTC reading glasses with her today. -hx of PAD.  Pain in calf with walking LT>RT. Pain has been worse recetly to the extent that she is not walking as much for exercise.  New pain in LT thigh constant x few wks. No change in color of extremities. Last saw Dr. Scot Dock 02/2019  Patient Care Team: [name], MD as PCP - General (Internal Medicine) [name], MD as PCP - Cardiology (Cardiology) Katy Fitch, MD - ophthalmology Gae Gallop, MD - [facility] any recent Medical Services you may have received from other than [facility] providers in the past year (date may be approximate). none    Assessment:   This is a routine wellness examination for  [place].  Hearing/Vision screen: Whisper test is normal BL  Hearing  Screening   125Hz  250Hz  500Hz  1000Hz  2000Hz  3000Hz  4000Hz  6000Hz  8000Hz   Right ear:           Left ear:             Visual Acuity Screening   Right eye Left eye Both eyes  Without correction: 20/50 20/40 20/30   With correction:     pt had eye exam 2 wks by her ophthalmologist and glasses are currently being made.  Dietary issues and exercise activities discussed: Not walking as much recently due to increase claudication symptoms Eating better since last visit.  Cut back on snacking. Eating fruits and veggies No fried foods.   Not too much carbs  Goals    .  Blood Pressure < 140/90 (pt-stated)      "To get my blood pressure regulated as much as possible."    .  Quit smoking / using tobacco       Depression Screen PHQ 2/9 Scores 08/30/2019 07/08/2019 04/28/2019 06/29/2018 03/16/2018 01/18/2018 01/07/2018  PHQ - 2 Score 1 3 1 3 2 2 3   PHQ- 9 Score 7 9 - 10 9 10 12    Mild depression due to pain in her legs and not able to walk as much as she would like. Does not feel she needs counseling or needs medication for depression  Fall Risk Fall Risk  08/30/2019 04/28/2019 01/21/2019 10/03/2016 07/10/2016  Falls in the past year? 0 0 0 No No  Number falls in past yr: 0 - - - -  Injury with Fall? 0 - - - -    Any stairs in or around the home? No   If so, are there any without handrails? NA Home free of loose throw rugs in walkways, pet beds, electrical cords, etc? Yes   Adequate lighting in your home to reduce risk of falls? Yes    ASSISTIVE DEVICES UTILIZED TO PREVENT FALLS:  Life alert? No  but feels she would benefit from getting one.   Use of a cane, walker or w/c? Yes  cane when walking down the street,  Helps to dec cramps. Legs get tire easily Grab bars in the bathroom? No  Shower chair or bench in shower? Yes  yes Elevated toilet seat or a handicapped toilet? Yes   TIMED UP AND GO:  Was the test performed?  Yes .  Length of time to ambulate 10 feet: 9 sec.   Gait slow and steady with assistive device  Cognitive Function:     6CIT Screen 08/30/2019  What Year? 0 points  What month? 0 points  What time? 0 points  Count back from 20 0 points  Months in reverse 0 points  Repeat phrase 0 points  Total Score 0    Immunizations  There is no immunization history on file for this patient.  TDAP status: Up to date Flu Vaccine status: Up to date Pneumococcal vaccine status: Declined,  Education has been provided regarding the importance of this vaccine but patient still declined. Advised may receive this vaccine at local pharmacy or Health Dept. Aware to provide a copy of the vaccination record if obtained from local pharmacy or Health Dept. Verbalized acceptance and understanding.  Covid-19 vaccine status: Information provided on how to obtain vaccines.  Will get COVID vaccine  Qualifies for Shingles Vaccine? Yes   Zostavax completed No   Shingrix Completed?: No.    Education has been provided regarding the importance of this vaccine. Patient has been advised to call insurance  company to determine out of pocket expense if they have not yet received this vaccine. Advised may also receive vaccine at local pharmacy or Health Dept. Verbalized acceptance and understanding. Pt states she will consider getting Shingles vaccine on next visit  Screening Tests Health Maintenance  Topic Date Due  . COVID-19 Vaccine (1) Never done  . INFLUENZA VACCINE  10/02/2019  . MAMMOGRAM  03/08/2020  . PAP SMEAR-Modifier  01/20/2022  . COLONOSCOPY  04/11/2022  . TETANUS/TDAP  04/09/2023  . Hepatitis C Screening  Completed  . HIV Screening  Completed    Health Maintenance  Health Maintenance Due  Topic Date Due  . COVID-19 Vaccine (1) Never done    Colorectal cancer screening: Completed 06/2015. Repeat every 10 years Mammogram status: Completed 03/2019. Repeat every year Bone Density: Patient does not  qualify for this as yet.  Lung Cancer Screening: (Low Dose CT Chest recommended if Age 59-80 years, 30 pack-year currently smoking OR have quit w/in 15years.) does qualify. Smoked sinc age 65 at 1 pk/day for a number of yrs.  Quit a few yrs ago.   Lung Cancer Screening Referral: discussed screening with pt.  She wants to hold off for now.  Will concern on next visit  Additional Screening:  Hepatitis C Screening: does qualify; Completed 03/15/2015  Vision Screening: Recommended annual ophthalmology exams for early detection of glaucoma and other disorders of the eye. Is the patient up to date with their annual eye exam?  Yes  Who is the provider or what is the name of the office in which the patient attends annual eye exams? Dr. Katy Fitch If pt is not established with a provider, would they like to be referred to a provider to establish care? NA.   Dental Screening: Recommended annual dental exams for proper oral hygiene Just got dentures 2 mths Feels not working.  Swelling to temple when he puts dentures in Community Resource Referral / Chronic Care Management: CRR required this visit?  No   CCM required this visit?  No      Plan:    1. Encounter for Medicare annual wellness exam -Patient thinks she would benefit from having life alert.  I informed her that she can call them up and inquire about monthly cost. -She is due for Shingrix but wants to delay this until next visit. -She does qualify for lung cancer screening.  She wants to think about this. -I had a detailed discussion with her about advanced directives.  She thinks she would like to execute 1.  I have given her an information packet that also includes documents to allow her to execute advanced directive in the form of a living will or healthcare power of attorney  2. Positive depression screening -Patient scored 8 on PHQ-9.  However she does not feel that she needs therapy or medication at this time.  She feels the root of the  issue is with her legs.  We will get her back in with Dr. Bobette Mo for further evaluation  3. PAD (peripheral artery disease) [facility]) Patient feels she is having increasing claudication in the legs making it difficult for her to walk.  I have started her on Pletal and will get her back in with her vascular surgeon  4. Jaw pain Sounds like her issue is due to ill fitting dentures.  I advised that she return to her dentist.  She may have to have the dentures adjusted for the right side of the jaw  5. Need  for shingles vaccine See #1 above  6. Educated about COVID-19 virus infection Discussed and encourage patient to have COVID-19 vaccine.  Informed her that the vaccines are relatively safe and effective.  She feels she will follow through with getting the vaccine.  I told her of places where she can get the vaccine done.  7. Class 1 obesity due to excess calories without serious comorbidity with body mass index (BMI) of 30.0 to 30.9 in adult Healthy eating habits discussed and encouraged   I have personally reviewed and noted the following in the patient's chart:   . Medical and social history . Use of alcohol, tobacco or illicit drugs  . Current medications and supplements . Functional ability and status . Nutritional status . Physical activity . Advanced directives . List of other physicians . Hospitalizations, surgeries, and ER visits in previous 12 months . Vitals . Screenings to include cognitive, depression, and falls . Referrals and appointments  In addition, I have reviewed and discussed with patient certain preventive protocols, quality metrics, and best practice recommendations. A written personalized care plan for preventive services as well as general preventive health recommendations were provided to patient.     Karle Plumber, MD   08/31/2019

## 2019-08-30 NOTE — Progress Notes (Signed)
HTN F/U  

## 2019-09-09 ENCOUNTER — Other Ambulatory Visit: Payer: Self-pay | Admitting: Internal Medicine

## 2019-09-09 DIAGNOSIS — I10 Essential (primary) hypertension: Secondary | ICD-10-CM

## 2019-09-09 DIAGNOSIS — Z760 Encounter for issue of repeat prescription: Secondary | ICD-10-CM

## 2019-09-20 ENCOUNTER — Other Ambulatory Visit: Payer: Self-pay | Admitting: *Deleted

## 2019-09-20 DIAGNOSIS — I739 Peripheral vascular disease, unspecified: Secondary | ICD-10-CM

## 2019-09-23 ENCOUNTER — Encounter (HOSPITAL_COMMUNITY): Payer: Medicare HMO

## 2019-09-23 ENCOUNTER — Ambulatory Visit: Payer: Medicare HMO

## 2019-09-28 DIAGNOSIS — R519 Headache, unspecified: Secondary | ICD-10-CM | POA: Insufficient documentation

## 2019-09-28 DIAGNOSIS — Z87891 Personal history of nicotine dependence: Secondary | ICD-10-CM | POA: Diagnosis not present

## 2019-09-28 DIAGNOSIS — I10 Essential (primary) hypertension: Secondary | ICD-10-CM | POA: Diagnosis not present

## 2019-09-28 DIAGNOSIS — Z7951 Long term (current) use of inhaled steroids: Secondary | ICD-10-CM | POA: Diagnosis not present

## 2019-09-28 DIAGNOSIS — Z853 Personal history of malignant neoplasm of breast: Secondary | ICD-10-CM | POA: Diagnosis not present

## 2019-09-28 DIAGNOSIS — J449 Chronic obstructive pulmonary disease, unspecified: Secondary | ICD-10-CM | POA: Insufficient documentation

## 2019-09-28 DIAGNOSIS — Z7982 Long term (current) use of aspirin: Secondary | ICD-10-CM | POA: Insufficient documentation

## 2019-09-28 DIAGNOSIS — Z79899 Other long term (current) drug therapy: Secondary | ICD-10-CM | POA: Insufficient documentation

## 2019-09-29 ENCOUNTER — Emergency Department (HOSPITAL_COMMUNITY)
Admission: EM | Admit: 2019-09-29 | Discharge: 2019-09-29 | Disposition: A | Discharge status: Home / Self Care | Payer: Medicare HMO | Attending: Emergency Medicine | Admitting: Emergency Medicine

## 2019-09-29 ENCOUNTER — Encounter (HOSPITAL_COMMUNITY): Payer: Self-pay | Admitting: Emergency Medicine

## 2019-09-29 ENCOUNTER — Other Ambulatory Visit: Payer: Self-pay

## 2019-09-29 ENCOUNTER — Emergency Department (HOSPITAL_COMMUNITY): Payer: Medicare HMO

## 2019-09-29 DIAGNOSIS — I10 Essential (primary) hypertension: Secondary | ICD-10-CM | POA: Diagnosis not present

## 2019-09-29 DIAGNOSIS — R519 Headache, unspecified: Secondary | ICD-10-CM | POA: Diagnosis not present

## 2019-09-29 NOTE — Discharge Instructions (Signed)
Please read and follow all provided instructions.  Your diagnoses today include:  1. Essential hypertension     Your blood pressure was high today (BP): BP (!) 132/73   Pulse 66   Temp 98.2 F (36.8 C) (Oral)   Resp 16   Ht 5\' 5"  (1.651 m)   Wt 81.6 kg   SpO2 98%   BMI 29.95 kg/m   Tests performed today include:  Vital signs. See below for your results today.   Medications prescribed:   None  Home care instructions:  Follow any educational materials contained in this packet.  Please continue to monitor your blood pressures at home.  If your pressures are high for more than 2 or 3 days, please let your doctor know.  Follow-up instructions: Please follow-up with your primary care provider in the next 7 days for a recheck of your symptoms and blood pressure.    Return instructions:   Please return to the Emergency Department if you experience worsening symptoms.   Return with severe chest pain, abdominal pain, or shortness of breath.   Return with severe headache, focal weakness, numbness, difficulty with speech or vision.  Return with loss of vision or sudden vision change.  Please return if you have any other emergent concerns.  Additional Information:  Your vital signs today were: BP (!) 132/73   Pulse 66   Temp 98.2 F (36.8 C) (Oral)   Resp 16   Ht 5\' 5"  (1.651 m)   Wt 81.6 kg   SpO2 98%   BMI 29.95 kg/m  If your blood pressure (BP) was elevated above 135/85 this visit, please have this repeated by your doctor within one month. --------------

## 2019-09-29 NOTE — ED Provider Notes (Signed)
[facility] EMERGENCY DEPARTMENT Provider Note   CSN: [id number] Arrival date & time: 09/28/19  2336     History Chief Complaint  Patient presents with  . Hypertension    Brandy Stevenson is a 64 y.o. female.  Patient with history of hypertension (on multiple antihypertensives), hyperlipidemia, peripheral arterial disease, COPD --presents to the emergency department today for elevated blood pressure.  Patient checks her blood pressures daily.  She takes medication in the morning and in the evening.  She checked her blood pressure last night after her evening medications and found her blood pressure to be elevated.  Typically it runs in the 503T to 465K systolic, however last night it was 173/97 with home blood pressure cuff.  Patient was concerned that her blood pressure did not improve with her medications, prompting emergency department visit.  Patient states that she takes Tylenol every day for different aches and pains in her upper back and neck.  She developed headache upon arrival to the emergency department and later took Tylenol which resolved her headache.  Patient denies signs of stroke including: facial droop, slurred speech, aphasia, weakness/numbness in extremities, imbalance/trouble walking.  She denies vision change or vision loss.  No chest pain or shortness of breath.  No history of chronic kidney disease and last creatinine checked earlier this year was normal.  Patient has been compliant with her blood pressure medications.         Past Medical History:  Diagnosis Date  . Arthritis    back problems to be evaluated by neurology next month  . Cancer ([place]) 2009   L, Breast, radiation and surgery.  [name] COPD (chronic obstructive pulmonary disease) ([place])   . Depression   . GERD (gastroesophageal reflux disease)   . Hyperlipemia   . Hypertension   . Leriche syndrome ([place])    Infrarenal aortic occlusion extending to bilateral internal iliac  arteries.  External iliacs fill via collaterals from lumbar arteries to hypogastric/internal iliac arteries -minimal disease on the right side and no disease on the left side leg arteries distally.  . Peripheral vascular disease ([place]) 2013    Patient Active Problem List   Diagnosis Date Noted  . Tubular adenoma of colon 04/27/2019  . Cervical radiculopathy 10/01/2018  . Hypertensive retinopathy 09/08/2018  . Seasonal allergies 06/29/2018  . Callous ulcer ([place]) 03/16/2018  . Aortoiliac occlusive disease ([place]) 11/04/2017  . DOE (dyspnea on exertion) 11/03/2017  . Atypical chest pain 11/03/2017  . Ichthyosis 10/06/2017  . Former smoker 04/07/2017  . Primary osteoarthritis of right shoulder 04/07/2017  . Chronic midline low back pain without sciatica 04/07/2017  . Hiatal hernia 05/01/2016  . DJD (degenerative joint disease) of cervical spine 04/17/2016  . History of breast cancer 02/18/2016  . Foot callus 02/18/2016  . Anxiety and depression 05/22/2015  . Insomnia 03/15/2015  . Unspecified vitamin D deficiency 04/08/2013  . Hyperlipidemia with target LDL less than 70 04/08/2013  . Atherosclerotic PVD with intermittent claudication ([place]) 03/22/2013  . Accelerated hypertension 02/23/2013  . GERD (gastroesophageal reflux disease) 02/23/2013  . COPD (chronic obstructive pulmonary disease) ([place]) 02/23/2013    Past Surgical History:  Procedure Laterality Date  . APPENDECTOMY  1980  . BREAST LUMPECTOMY Left    s/p radiation therapy   . BREAST SURGERY    . COLONOSCOPY  07/2015  . COLONOSCOPY WITH PROPOFOL N/A 06/26/2015   Procedure: COLONOSCOPY WITH PROPOFOL;  Surgeon: [name], MD;  Location: [facility] ENDOSCOPY;  Service: Endoscopy;  Laterality: N/A;  . CT CTA CORONARY W/CA SCORE W/CM &/OR WO/CM  12/10/2017   Coronary calcium score is 0.  No evidence of CAD.  [name] PERIPHERAL VASCULAR CATHETERIZATION N/A 10/22/2015   Procedure: Abdominal Aortogram w/Lower Extremity;  Surgeon: [name], MD;  Location: [facility] INVASIVE CV LAB: Aortic arch widely patent as far as innominate artery, right subclavian artery, right vertebral artery and right, common carotid.  Bilateral single renal arteries - Nonocclusive disease. Infrarenal aorta 100% - bilateral common Iliac 100% --> B Ex Iliacs fill via Int Iliac. Leg OK  . TRANSTHORACIC ECHOCARDIOGRAM  08/2017   EF 60-65%. No RWMA.  Normal DF.  Normal valves.   . TUBAL LIGATION  1991     OB History   No obstetric history on file.     Family History  Problem Relation Age of Onset  . Hyperlipidemia Mother   . Hypertension Mother   . Heart disease Mother        Atrial Fib.  . Hyperlipidemia Sister   . Hypertension Sister   . Heart disease Sister        Before age 53,  CHF  . Heart attack Brother   . Hyperlipidemia Brother   . Hypertension Brother   . Heart disease Brother   . Hypertension Daughter     Social History   Tobacco Use  . Smoking status: Former Smoker    Packs/day: 0.25    Years: 40.00    Pack years: 10.00    Types: Cigarettes    Quit date: 02/08/2017    Years since quitting: 2.6  . Smokeless tobacco: Never Used  . Tobacco comment: 3 cigarettes per day  Vaping Use  . Vaping Use: Never used  Substance Use Topics  . Alcohol use: No  . Drug use: No    Home Medications Prior to Admission medications   Medication Sig Start Date End Date Taking? Authorizing Provider  albuterol (VENTOLIN HFA) 108 (90 Base) MCG/ACT inhaler Inhale 2 puffs into the lungs every 6 (six) hours as needed. for wheezing 03/21/19   [name], MD  amLODipine (NORVASC) 10 MG tablet TAKE 1 TABLET AT BEDTIME 09/09/19   [name], MD  ammonium lactate (LAC-HYDRIN) 12 % cream Apply topically as needed for dry skin. 10/06/17   [name], MD  aspirin EC 81 MG tablet Take 1 tablet (81 mg total) by mouth daily. 10/03/16   Boykin Nearing, MD  atorvastatin (LIPITOR) 40 MG tablet TAKE 1 TABLET EVERY DAY 09/09/19   [name], MD  carvedilol (COREG) 6.25 MG tablet TAKE 1 TABLET TWICE DAILY 09/09/19   [name], MD  cilostazol (PLETAL) 50 MG tablet Take 1 tablet (50 mg total) by mouth 2 (two) times daily. 08/30/19   [name], MD  Fluticasone-Salmeterol (ADVAIR) 100-50 MCG/DOSE AEPB Inhale 1 puff into the lungs 2 (two) times daily. 05/03/19   [name], MD  gabapentin (NEURONTIN) 300 MG capsule Take 1 capsule (300 mg total) by mouth at bedtime. 02/10/19   [name], MD  hydrALAZINE (APRESOLINE) 10 MG tablet Take 2 tablets (20 mg total) by mouth 2 (two) times daily. 07/08/19   [name], MD  lisinopril (ZESTRIL) 40 MG tablet Take 1 tablet (40 mg total) by mouth daily. 05/03/19   [name], MD  meloxicam (MOBIC) 15 MG tablet TAKE 1 TABLET EVERY DAY 09/09/19   [name], MD  nystatin (MYCOSTATIN) 100000 UNIT/ML  suspension Take 5 mLs (500,000 Units total) by mouth 4 (four) times daily. Swish and swallow 06/23/19   Freeman Caldron M, PA-C  omeprazole (PRILOSEC) 40 MG capsule TAKE 1 CAPSULE (40 MG TOTAL) BY MOUTH DAILY. 09/09/19   [name], MD  spironolactone (ALDACTONE) 25 MG tablet Take 1 tablet (25 mg total) by mouth daily. 07/08/19   [name], MD    Allergies    Vicodin [hydrocodone-acetaminophen], Bactrim [sulfamethoxazole-trimethoprim], Incruse ellipta [umeclidinium bromide], and Wellbutrin [bupropion]  Review of Systems   Review of Systems  Constitutional: Negative for diaphoresis and fever.  Eyes: Negative for redness.  Respiratory: Negative for cough and shortness of breath.   Cardiovascular: Negative for chest pain, palpitations and leg swelling.  Gastrointestinal: Negative for abdominal pain, nausea and vomiting.  Genitourinary: Negative for dysuria.  Musculoskeletal: Negative for back pain and neck pain.  Skin: Negative for rash.  Neurological: Positive for headaches. Negative for syncope, facial asymmetry, speech difficulty,  weakness and light-headedness.  Psychiatric/Behavioral: The patient is not nervous/anxious.     Physical Exam Updated Vital Signs BP (!) 132/73   Pulse 66   Temp 98.2 F (36.8 C) (Oral)   Resp 16   Ht 5\' 5"  (1.651 m)   Wt 81.6 kg   SpO2 98%   BMI 29.95 kg/m   Physical Exam Vitals and nursing note reviewed.  Constitutional:      Appearance: She is well-developed. She is not diaphoretic.  HENT:     Head: Normocephalic and atraumatic.     Mouth/Throat:     Mouth: Mucous membranes are not dry.  Eyes:     Conjunctiva/sclera: Conjunctivae normal.  Neck:     Vascular: No JVD.     Trachea: Trachea normal.  Cardiovascular:     Rate and Rhythm: Normal rate and regular rhythm.     Pulses: No decreased pulses.     Heart sounds: Normal heart sounds, S1 normal and S2 normal. No murmur heard.   Pulmonary:     Effort: Pulmonary effort is normal. No respiratory distress.     Breath sounds: No wheezing.  Chest:     Chest wall: No tenderness.  Abdominal:     General: Bowel sounds are normal.     Palpations: Abdomen is soft.     Tenderness: There is no abdominal tenderness. There is no guarding or rebound.  Musculoskeletal:        General: Normal range of motion.     Cervical back: Normal range of motion and neck supple. No muscular tenderness.  Skin:    General: Skin is warm and dry.     Coloration: Skin is not pale.  Neurological:     General: No focal deficit present.     Mental Status: She is alert.     Cranial Nerves: No cranial nerve deficit.     Motor: No weakness.  Psychiatric:        Mood and Affect: Mood normal.     ED Results / Procedures / Treatments   Labs (all labs ordered are listed, but only abnormal results are displayed) Labs Reviewed - No data to display  EKG None  Radiology CT Head Wo Contrast  Result Date: 09/29/2019 CLINICAL DATA:  Headache and hypertension EXAM: CT HEAD WITHOUT CONTRAST TECHNIQUE: Contiguous axial images were obtained from the  base of the skull through the vertex without intravenous contrast. COMPARISON:  October 12, 2014 FINDINGS: Brain: No evidence of acute territorial infarction, hemorrhage, hydrocephalus,extra-axial collection or mass  lesion/mass effect. Normal gray-white differentiation. Ventricles are normal in size and contour. Vascular: No hyperdense vessel or unexpected calcification. Skull: The skull is intact. No fracture or focal lesion identified. Sinuses/Orbits: The visualized paranasal sinuses and mastoid air cells are clear. The orbits and globes intact. Other: None IMPRESSION: No acute intracranial abnormality. Electronically Signed   By: Prudencio Pair M.D.   On: 09/29/2019 01:28    Procedures Procedures (including critical care time)  Medications Ordered in ED Medications - No data to display  ED Course  I have reviewed the triage vital signs and the nursing notes.  Pertinent labs & imaging results that were available during my care of the patient were reviewed by me and considered in my medical decision making (see chart for details).  Patient seen and examined.  Reviewed CT imaging.  Reviewed previous lab work.  Patient is currently asymptomatic.  Headache is resolved.  She presented last night due to concerns that her blood pressure remained elevated on home BP cuff, despite taking medications.  Prior to arrival she was asymptomatic.  Patient has a reassuring exam at the current time.  She is comfortable with going home and would like to so she can take her 9 AM blood pressure medications.  We discussed continuing monitoring her blood pressure medications and signs and symptoms to return including any strokelike symptoms, vision changes, chest pain or shortness of breath.  Discussed that if her blood pressure remains higher than typical for several days, she should call her primary care doctor for further instructions.  Vital signs reviewed and are as follows: BP (!) 132/73   Pulse 66   Temp 98.2 F  (36.8 C) (Oral)   Resp 16   Ht 5\' 5"  (1.651 m)   Wt 81.6 kg   SpO2 98%   BMI 29.95 kg/m       MDM Rules/Calculators/A&P                          Patient here simply for elevated blood pressure.  No strokelike symptoms.  She had a headache on arrival to the ED, evaluated with CT imaging overnight.  This was negative for bleeding.  No other signs of endorgan damage at this time.  We will continue to have patient take her typical blood pressure medication regimen, which per patient report, seems to be doing very well for her.   Final Clinical Impression(s) / ED Diagnoses Final diagnoses:  Essential hypertension    Rx / DC Orders ED Discharge Orders    None       Carlisle Cater, PA-C 09/29/19 1245    Lucrezia Starch, MD 09/30/19 [phone]

## 2019-09-29 NOTE — ED Notes (Signed)
Patient verbalizes understanding of discharge instructions. Opportunity for questioning and answers were provided. Pt discharged from ED. 

## 2019-09-29 NOTE — ED Triage Notes (Signed)
Pt presents to ED POV. Pt c/o HTN. Pt reports 173/97bp with home bp cuff. Pt reports taking her bp meds as normal. Pt reports headache just beginning now 5/10. neuro intact

## 2019-10-05 ENCOUNTER — Encounter: Payer: Self-pay | Admitting: Vascular Surgery

## 2019-10-05 ENCOUNTER — Other Ambulatory Visit: Payer: Self-pay

## 2019-10-05 ENCOUNTER — Ambulatory Visit (HOSPITAL_COMMUNITY)
Admission: RE | Admit: 2019-10-05 | Discharge: 2019-10-05 | Disposition: A | Discharge status: Home / Self Care | Payer: Medicare HMO | Source: Ambulatory Visit | Attending: Vascular Surgery | Admitting: Vascular Surgery

## 2019-10-05 ENCOUNTER — Ambulatory Visit (INDEPENDENT_AMBULATORY_CARE_PROVIDER_SITE_OTHER): Payer: Medicare HMO | Admitting: Vascular Surgery

## 2019-10-05 VITALS — BP 153/94 | HR 56 | Temp 97.8°F | Resp 20 | Ht 65.0 in | Wt 183.0 lb

## 2019-10-05 DIAGNOSIS — I7409 Other arterial embolism and thrombosis of abdominal aorta: Secondary | ICD-10-CM

## 2019-10-05 DIAGNOSIS — I739 Peripheral vascular disease, unspecified: Secondary | ICD-10-CM

## 2019-10-05 DIAGNOSIS — M7989 Other specified soft tissue disorders: Secondary | ICD-10-CM

## 2019-10-05 NOTE — Progress Notes (Signed)
REASON FOR VISIT:   Follow-up of peripheral vascular disease.  MEDICAL ISSUES:   PERIPHERAL VASCULAR DISEASE: This patient has a known aortic occlusion.  She comes in for 15-month follow-up visit.  Her symptoms have been stable.  She still limited in her activity because of some dogs that live in her neighborhood which prevent her from walking is much as she would like.  She quit smoking in 2018.  I have again encouraged her to continue to walk as much as possible.  She would like to try a mild compression stocking and we will try her to 15-20 if this is too tight will go down to a 10-15.  I have ordered follow-up ABIs in 6 months and I will see her back at that time.  She knows to call sooner if she has problems.   HPI:   Brandy Stevenson is a pleasant 64 y.o. female with a known aortic occlusion.  I last saw her on 02/16/2019.  She had stable bilateral calf claudication at that time.  She had not been walking as much and therefore her legs were getting tired quicker.  She denied any rest pain or nonhealing ulcers.  She quit smoking in 2018.  Since I saw her last she continues to have calf claudication bilaterally.  She experiences pain in both calves which is brought on by ambulation and relieved with rest.  She can walk to her mailbox and back before experiencing symptoms.  She denies any history of rest pain or nonhealing ulcers.  There have been no significant changes to her medical history.  Past Medical History:  Diagnosis Date   Arthritis    back problems to be evaluated by neurology next month   Cancer [facility]) 2009   L, Breast, radiation and surgery.   COPD (chronic obstructive pulmonary disease) ([facility])    Depression    GERD (gastroesophageal reflux disease)    Hyperlipemia    Hypertension    Leriche syndrome ([facility])    Infrarenal aortic occlusion extending to bilateral internal iliac arteries.  External iliacs fill via collaterals from lumbar arteries to  hypogastric/internal iliac arteries -minimal disease on the right side and no disease on the left side leg arteries distally.   Peripheral vascular disease ([place]) 2013    Family History  Problem Relation Age of Onset   Hyperlipidemia Mother    Hypertension Mother    Heart disease Mother        Atrial Fib.   Hyperlipidemia Sister    Hypertension Sister    Heart disease Sister        Before age 64,  CHF   Heart attack Brother    Hyperlipidemia Brother    Hypertension Brother    Heart disease Brother    Hypertension Daughter     SOCIAL HISTORY: Social History   Tobacco Use   Smoking status: Former Smoker    Packs/day: 0.25    Years: 40.00    Pack years: 10.00    Types: Cigarettes    Quit date: 02/08/2017    Years since quitting: 2.6   Smokeless tobacco: Never Used  Substance Use Topics   Alcohol use: No    Allergies  Allergen Reactions   Vicodin [Hydrocodone-Acetaminophen] Nausea And Vomiting and Other (See Comments)    Sweating   Bactrim [Sulfamethoxazole-Trimethoprim]     HA and vomiting   Incruse Ellipta [Umeclidinium Bromide]     Dry mouth   Wellbutrin [Bupropion]     Made  me feel bad    Current Outpatient Medications  Medication Sig Dispense Refill   albuterol (VENTOLIN HFA) 108 (90 Base) MCG/ACT inhaler Inhale 2 puffs into the lungs every 6 (six) hours as needed. for wheezing 54 g 6   amLODipine (NORVASC) 10 MG tablet TAKE 1 TABLET AT BEDTIME 90 tablet 0   ammonium lactate (LAC-HYDRIN) 12 % cream Apply topically as needed for dry skin. 385 g 0   aspirin EC 81 MG tablet Take 1 tablet (81 mg total) by mouth daily. 90 tablet 3   atorvastatin (LIPITOR) 40 MG tablet TAKE 1 TABLET EVERY DAY 90 tablet 0   carvedilol (COREG) 6.25 MG tablet TAKE 1 TABLET TWICE DAILY 180 tablet 0   cilostazol (PLETAL) 50 MG tablet Take 1 tablet (50 mg total) by mouth 2 (two) times daily. 60 tablet 1   Fluticasone-Salmeterol (ADVAIR) 100-50 MCG/DOSE AEPB  Inhale 1 puff into the lungs 2 (two) times daily. 180 each 0   gabapentin (NEURONTIN) 300 MG capsule Take 1 capsule (300 mg total) by mouth at bedtime. 90 capsule 2   hydrALAZINE (APRESOLINE) 10 MG tablet Take 2 tablets (20 mg total) by mouth 2 (two) times daily. 360 tablet 1   lisinopril (ZESTRIL) 40 MG tablet Take 1 tablet (40 mg total) by mouth daily. 90 tablet 0   meloxicam (MOBIC) 15 MG tablet TAKE 1 TABLET EVERY DAY 90 tablet 1   nystatin (MYCOSTATIN) 100000 UNIT/ML suspension Take 5 mLs (500,000 Units total) by mouth 4 (four) times daily. Swish and swallow 100 mL 0   omeprazole (PRILOSEC) 40 MG capsule TAKE 1 CAPSULE (40 MG TOTAL) BY MOUTH DAILY. 90 capsule 1   spironolactone (ALDACTONE) 25 MG tablet Take 1 tablet (25 mg total) by mouth daily. 90 tablet 1   No current facility-administered medications for this visit.    REVIEW OF SYSTEMS:  [X]  denotes positive finding, [ ]  denotes negative finding Cardiac  Comments:  Chest pain or chest pressure:    Shortness of breath upon exertion:    Short of breath when lying flat:    Irregular heart rhythm:        Vascular    Pain in calf, thigh, or hip brought on by ambulation: x   Pain in feet at night that wakes you up from your sleep:  x   Blood clot in your veins:    Leg swelling:         Pulmonary    Oxygen at home:    Productive cough:     Wheezing:         Neurologic    Sudden weakness in arms or legs:     Sudden numbness in arms or legs:     Sudden onset of difficulty speaking or slurred speech:    Temporary loss of vision in one eye:     Problems with dizziness:         Gastrointestinal    Blood in stool:     Vomited blood:         Genitourinary    Burning when urinating:     Blood in urine:        Psychiatric    Major depression:         Hematologic    Bleeding problems:    Problems with blood clotting too easily:        Skin    Rashes or ulcers:        Constitutional    Fever or  chills:      PHYSICAL EXAM:   Vitals:   10/05/19 0844  BP: (!) 153/94  Pulse: (!) 56  Resp: 20  Temp: 97.8 F (36.6 C)  SpO2: 98%  Weight: 83 kg  Height: 5\' 5"  (1.651 m)    GENERAL: The patient is a well-nourished female, in no acute distress. The vital signs are documented above. CARDIAC: There is a regular rate and rhythm.  VASCULAR: I do not detect carotid bruits I cannot palpate femoral pulses or pedal pulses. She has monophasic Doppler signals in both feet. PULMONARY: There is good air exchange bilaterally without wheezing or rales. ABDOMEN: Soft and non-tender with normal pitched bowel sounds.  MUSCULOSKELETAL: There are no major deformities or cyanosis. NEUROLOGIC: No focal weakness or paresthesias are detected. SKIN: There are no ulcers or rashes noted. PSYCHIATRIC: The patient has a normal affect.  DATA:    ARTERIAL DOPPLER STUDY: I have independently interpreted her arterial Doppler study today.  On the right side there is a monophasic dorsalis pedis and posterior tibial signal.  ABI 71%.  This has improved compared to 51% on her most recent study.  On the left side she has a monophasic dorsalis pedis and posterior tibial signal.  ABI 71%.  This has improved from 55% on her previous study.  Deitra Mayo [facility] [phone]

## 2019-10-06 ENCOUNTER — Other Ambulatory Visit: Payer: Self-pay | Admitting: *Deleted

## 2019-10-06 DIAGNOSIS — I739 Peripheral vascular disease, unspecified: Secondary | ICD-10-CM

## 2019-10-19 ENCOUNTER — Other Ambulatory Visit: Payer: Self-pay | Admitting: Family Medicine

## 2019-10-19 DIAGNOSIS — J449 Chronic obstructive pulmonary disease, unspecified: Secondary | ICD-10-CM

## 2019-10-19 NOTE — Telephone Encounter (Signed)
Requested Prescriptions  Pending Prescriptions Disp Refills  . albuterol (VENTOLIN HFA) 108 (90 Base) MCG/ACT inhaler [Pharmacy Med Name: ALBUTEROL SULFATE HFA 108 (90 Base) MCG/ACT Aerosol Solution] 54 g 6    Sig: INHALE 2 PUFFS EVERY 6 HOURS AS NEEDED FOR WHEEZING     Pulmonology:  Beta Agonists Failed - 10/19/2019  5:51 PM      Failed - One inhaler should last at least one month. If the patient is requesting refills earlier, contact the patient to check for uncontrolled symptoms.      Passed - Valid encounter within last 12 months    Recent Outpatient Visits          1 month ago Encounter for [employer] annual wellness exam   [place] [name], MD   3 months ago Essential hypertension   [place], MD   5 months ago Essential hypertension   [place], MD   9 months ago Pap smear for cervical cancer screening   [place], Deborah B, MD   9 months ago Essential hypertension   [place], RPH-CPP

## 2019-11-24 ENCOUNTER — Other Ambulatory Visit: Payer: Self-pay | Admitting: Internal Medicine

## 2019-11-24 DIAGNOSIS — I70219 Atherosclerosis of native arteries of extremities with intermittent claudication, unspecified extremity: Secondary | ICD-10-CM

## 2019-11-29 ENCOUNTER — Telehealth: Payer: Self-pay | Admitting: Internal Medicine

## 2019-11-29 MED ORDER — NYSTATIN 100000 UNIT/ML MT SUSP: 5.0000 mL | Freq: Four times a day (QID) | OROMUCOSAL | 0 refills | Status: DC

## 2019-11-29 NOTE — Telephone Encounter (Signed)
Luke please fill if appropriate  

## 2019-11-29 NOTE — Telephone Encounter (Signed)
Refill sent.

## 2019-11-29 NOTE — Telephone Encounter (Signed)
Pt states occasionally her inhaler will give her thrush in her mouth.  pt states she has a pretty bad case of it now.  The dr will prescribe nystatin (MYCOSTATIN) 100000 UNIT/ML suspension  Pt requesting refill of this sent to  [facility] [place] ([place] [place] Phone:  [phone]  Fax:  [phone]

## 2019-11-30 MED FILL — NYSTATIN 100,000 UNITS/ML S: 100000 | 5 days supply | Qty: 100 | Fill #0

## 2019-12-05 ENCOUNTER — Other Ambulatory Visit: Payer: Self-pay | Admitting: Internal Medicine

## 2019-12-05 DIAGNOSIS — I10 Essential (primary) hypertension: Secondary | ICD-10-CM

## 2019-12-06 NOTE — Telephone Encounter (Addendum)
Requested medications are due for refill today?  Yes  Requested medications are on active medication list?  Yes  Last Refill:  05/03/2019  # 90 with no refills  Future visit scheduled? Yes  Notes to Clinic:  Medication failed Rx refill protocol due to no creatinine in the past 180 days. Last creatinine was performed on 04/28/2019.

## 2019-12-18 IMAGING — CR DG CHEST 2V
2 series · 2 of 2 positions shown · non-contrast
Comparison: 02/15/2017

CLINICAL DATA: Intermittent left chest pain x6 months, remote
history of left breast cancer

EXAM:
CHEST - 2 VIEW

[chest pa]
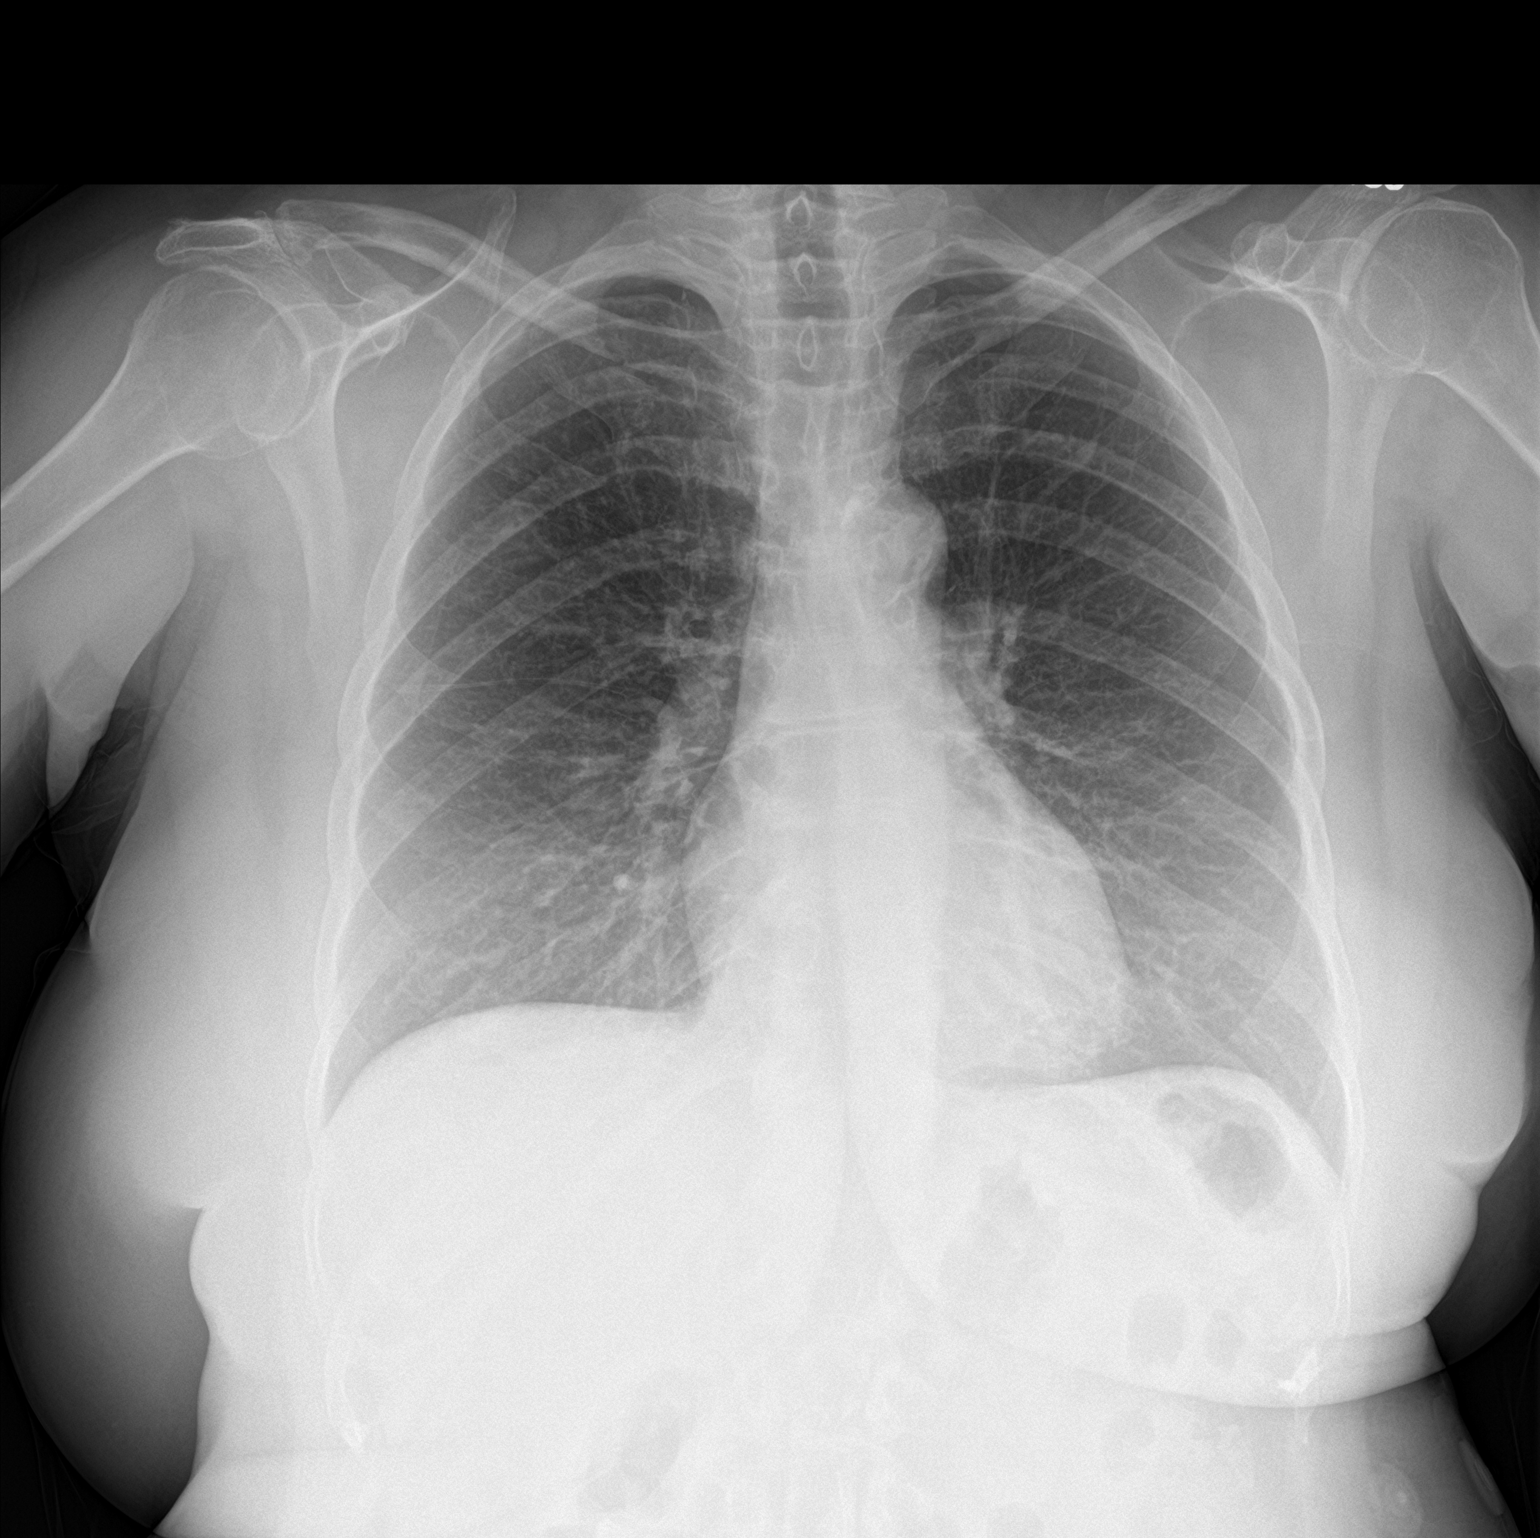

[chest lat]
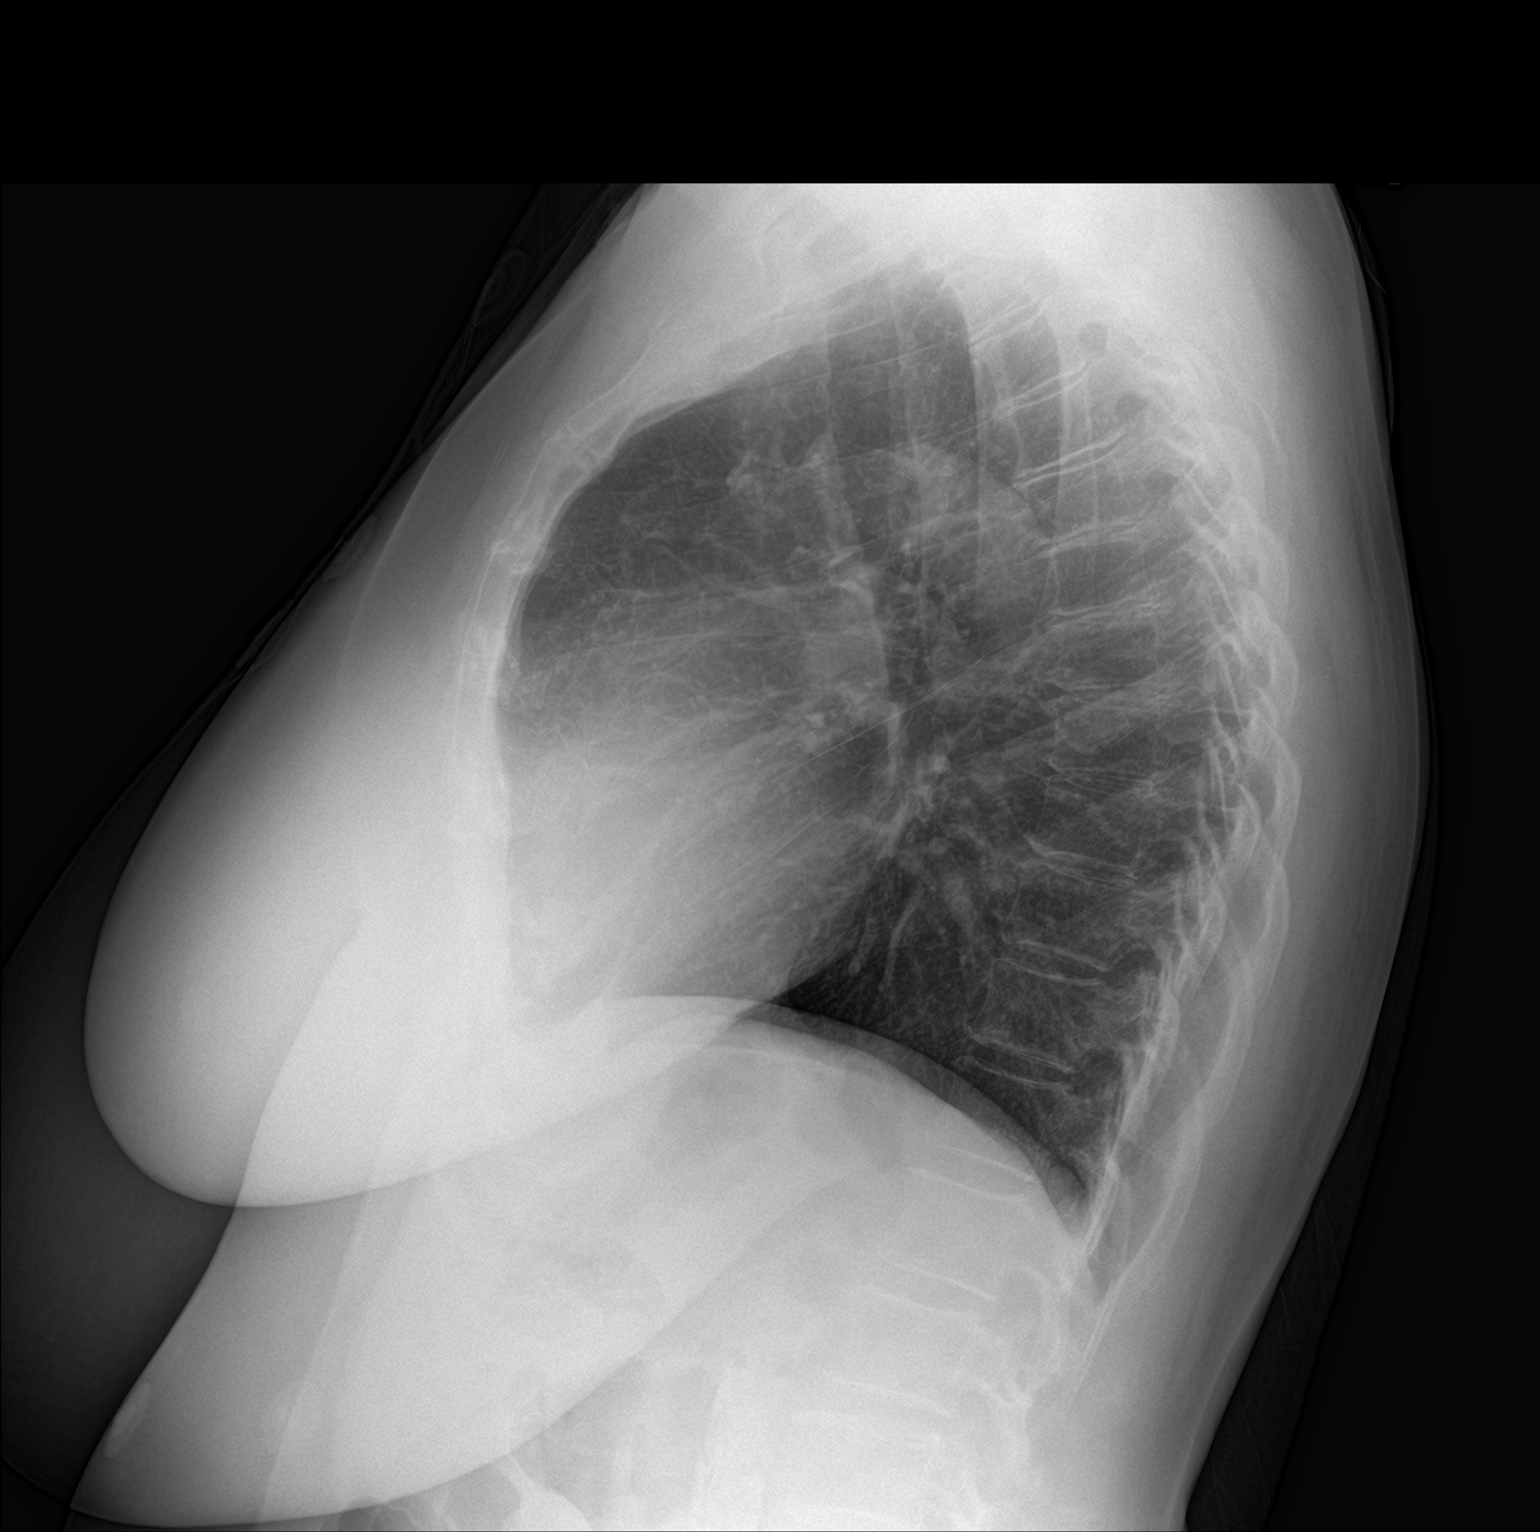

[2 of 2 positions shown; findings below may reference images not displayed]

FINDINGS: Lungs are clear.  No pleural effusion or pneumothorax.

The heart is normal in size.

Visualized osseous structures are within normal limits.
IMPRESSION: Normal chest radiographs.

## 2020-01-09 ENCOUNTER — Telehealth: Payer: Self-pay | Admitting: Cardiology

## 2020-01-09 NOTE — Telephone Encounter (Signed)
Patient stated she was returning a call from the office. The call ended up being for her mother.

## 2020-01-16 ENCOUNTER — Other Ambulatory Visit: Payer: Self-pay | Admitting: Cardiology

## 2020-01-16 ENCOUNTER — Other Ambulatory Visit: Payer: Self-pay | Admitting: Internal Medicine

## 2020-01-16 DIAGNOSIS — I10 Essential (primary) hypertension: Secondary | ICD-10-CM

## 2020-01-16 DIAGNOSIS — J449 Chronic obstructive pulmonary disease, unspecified: Secondary | ICD-10-CM

## 2020-01-16 NOTE — Telephone Encounter (Signed)
Requested medication (s) are due for refill today - yes  Requested medication (s) are on the active medication list -yes  Future visit scheduled -yes  Last refill: 10/25/19  Notes to clinic: Drugs are on current list- but appear with duplicate warnings- sent for review of request  Requested Prescriptions  Pending Prescriptions Disp Refills   spironolactone (ALDACTONE) 25 MG tablet [Pharmacy Med Name: SPIRONOLACTONE 25 MG Tablet] 90 tablet 0    Sig: TAKE 1 TABLET EVERY DAY      Cardiovascular: Diuretics - Aldosterone Antagonist Failed - 01/16/2020 10:15 AM      Failed - Na in normal range and within 360 days    Sodium  Date Value Ref Range Status  04/28/2019 145 (H) 134 - 144 mmol/L Final          Failed - Last BP in normal range    BP Readings from Last 1 Encounters:  10/05/19 (!) 153/94          Failed - Valid encounter within last 6 months    Recent Outpatient Visits           4 months ago Encounter for [employer] annual wellness exam   [place] [name], MD   6 months ago Essential hypertension   [place], Deborah B, MD   8 months ago Essential hypertension   [place], MD   12 months ago Pap smear for cervical cancer screening   [place], Deborah B, MD   1 year ago Essential hypertension   [place], RPH-CPP       Future Appointments             In 4 days [name], MD [place], [facility]   In 2 weeks [name], MD [place] - Cr in normal range and within 360 days    Creat  Date Value Ref Range Status  04/17/2016 0.77 0.50 - 1.05 mg/dL Final    Comment:      For patients > or = 63 years of age: The upper reference limit for Creatinine is  approximately 13% higher for people identified as African-American.      Creatinine, Ser  Date Value Ref Range Status  04/28/2019 0.79 0.57 - 1.00 mg/dL Final          Passed - K in normal range and within 360 days    Potassium  Date Value Ref Range Status  07/08/2019 3.7 3.5 - 5.2 mmol/L Final            hydrALAZINE (APRESOLINE) 10 MG tablet [Pharmacy Med Name: HYDRALAZINE HCL 10 MG Tablet] 360 tablet 0    Sig: TAKE 2 TABLETS TWICE DAILY      Cardiovascular:  Vasodilators Failed - 01/16/2020 10:15 AM      Failed - Last BP in normal range    BP Readings from Last 1 Encounters:  10/05/19 (!) 153/94          Passed - HCT in normal range and within 360 days    HCT  Date Value Ref Range Status  06/12/2008 40.2 34 - 46 % Final   Hematocrit  Date Value Ref Range Status  04/28/2019 35.6 34.0 - 46.6 %  Final          Passed - HGB in normal range and within 360 days    Hemoglobin  Date Value Ref Range Status  04/28/2019 12.0 11.1 - 15.9 g/dL Final   HGB  Date Value Ref Range Status  06/12/2008 13.8 11.6 - 15.9 g/dL Final          Passed - RBC in normal range and within 360 days    RBC  Date Value Ref Range Status  04/28/2019 3.86 3.77 - 5.28 x10E6/uL Final  08/25/2017 4.22 3.87 - 5.11 MIL/uL Final          Passed - WBC in normal range and within 360 days    WBC  Date Value Ref Range Status  04/28/2019 4.8 3.4 - 10.8 x10E3/uL Final  08/25/2017 6.6 4.0 - 10.5 K/uL Final          Passed - PLT in normal range and within 360 days    Platelets  Date Value Ref Range Status  04/28/2019 246 150 - 450 x10E3/uL Final          Passed - Valid encounter within last 12 months    Recent Outpatient Visits           4 months ago Encounter for [employer] annual wellness exam   [place], Deborah B, MD   6 months ago Essential hypertension   [place], Deborah B, MD   8 months ago  Essential hypertension   [place], Deborah B, MD   12 months ago Pap smear for cervical cancer screening   [place], Deborah B, MD   1 year ago Essential hypertension   [place], RPH-CPP       Future Appointments             In 4 days [name], MD [facility] [place], [facility]   In 2 weeks [name], MD [place]             Signed Prescriptions Disp Refills   Fluticasone-Salmeterol (ADVAIR) 100-50 MCG/DOSE AEPB 180 each 0    Sig: INHALE 1 PUFF INTO THE LUNGS 2 (TWO) TIMES DAILY.      Pulmonology:  Combination Products Passed - 01/16/2020 10:15 AM      Passed - Valid encounter within last 12 months    Recent Outpatient Visits           4 months ago Encounter for [employer] annual wellness exam   [place] [name], MD   6 months ago Essential hypertension   [place], MD   8 months ago Essential hypertension   [place], MD   12 months ago Pap smear for cervical cancer screening   [place], Deborah B, MD   1 year ago Essential hypertension   [place], [place], RPH-CPP       Future Appointments             In 4 days [name], MD [place], [facility]   In 2 weeks [name], MD [place]  amLODipine (NORVASC) 10 MG tablet 90 tablet 0    Sig: TAKE 1 TABLET AT BEDTIME      Cardiovascular:  Calcium Channel Blockers Failed - 01/16/2020 10:15 AM      Failed - Last BP in normal range    BP Readings from Last 1 Encounters:  10/05/19 (!) 153/94          Failed - Valid encounter  within last 6 months    Recent Outpatient Visits           4 months ago Encounter for [employer] annual wellness exam   [place], Neoma Laming B, MD   6 months ago Essential hypertension   [place], [place], MD   8 months ago Essential hypertension   [place], MD   12 months ago Pap smear for cervical cancer screening   [place], Deborah B, MD   1 year ago Essential hypertension   [place], [place], RPH-CPP       Future Appointments             In 4 days [name], MD [facility] [place], [facility]   In 2 weeks [name], MD [place]              atorvastatin (LIPITOR) 40 MG tablet 90 tablet 0    Sig: TAKE 1 TABLET EVERY DAY      Cardiovascular:  Antilipid - Statins Failed - 01/16/2020 10:15 AM      Failed - LDL in normal range and within 360 days    LDL Chol Calc (NIH)  Date Value Ref Range Status  04/28/2019 62 0 - 99 mg/dL Final          Passed - Total Cholesterol in normal range and within 360 days    Cholesterol, Total  Date Value Ref Range Status  04/28/2019 146 100 - 199 mg/dL Final          Passed - HDL in normal range and within 360 days    HDL  Date Value Ref Range Status  04/28/2019 70 >39 mg/dL Final          Passed - Triglycerides in normal range and within 360 days    Triglycerides  Date Value Ref Range Status  04/28/2019 69 0 - 149 mg/dL Final          Passed - Patient is not pregnant      Passed - Valid encounter within last 12 months    Recent Outpatient Visits           4 months ago Encounter for [employer] annual wellness exam   [place], Deborah B, MD   6 months ago Essential hypertension   [place], Deborah B, MD   8 months ago Essential hypertension   [place], Deborah B, MD   12 months ago Pap smear for cervical cancer screening   [place], Deborah B, MD   1 year ago Essential hypertension   [place], RPH-CPP       Future Appointments             In 4  days [name], MD [facility] [place], [facility]   In 2 weeks [name], MD [place]                Requested Prescriptions  Pending Prescriptions Disp Refills   spironolactone (ALDACTONE) 25 MG tablet [Pharmacy Med Name: SPIRONOLACTONE 25 MG Tablet] 90 tablet 0    Sig: TAKE 1 TABLET EVERY DAY      Cardiovascular: Diuretics - Aldosterone Antagonist Failed - 01/16/2020 10:15 AM      Failed - Na in normal range and within 360 days    Sodium  Date Value Ref Range Status  04/28/2019 145 (H) 134 - 144 mmol/L Final          Failed - Last BP in normal range    BP Readings from Last 1 Encounters:  10/05/19 (!) 153/94          Failed - Valid encounter within last 6 months    Recent Outpatient Visits           4 months ago Encounter for [employer] annual wellness exam   [place] [name], MD   6 months ago Essential hypertension   [place], Deborah B, MD   8 months ago Essential hypertension   [place], MD   12 months ago Pap smear for cervical cancer screening   [place], Deborah B, MD   1 year ago Essential hypertension   [place], RPH-CPP       Future Appointments             In 4 days [name], MD [place], [facility]   In 2 weeks [name], MD  [place] - Cr in normal range and within 360 days    Creat  Date Value Ref Range Status  04/17/2016 0.77 0.50 - 1.05 mg/dL Final    Comment:      For patients > or = 63 years of age: The upper reference limit for Creatinine is approximately 13% higher for people identified as African-American.      Creatinine, Ser  Date Value Ref Range Status  04/28/2019 0.79 0.57 - 1.00 mg/dL Final          Passed - K in normal range and within 360 days    Potassium  Date Value Ref Range Status  07/08/2019 3.7 3.5 - 5.2 mmol/L Final            hydrALAZINE (APRESOLINE) 10 MG tablet [Pharmacy Med Name: HYDRALAZINE HCL 10 MG Tablet] 360 tablet 0    Sig: TAKE 2 TABLETS TWICE DAILY      Cardiovascular:  Vasodilators Failed - 01/16/2020 10:15 AM      Failed - Last BP in normal range    BP Readings from Last 1 Encounters:  10/05/19 (!) 153/94          Passed - HCT in normal range and within 360 days    HCT  Date Value Ref Range Status  06/12/2008 40.2 34 - 46 % Final   Hematocrit  Date Value Ref Range Status  04/28/2019 35.6 34.0 - 46.6 % Final          Passed -  HGB in normal range and within 360 days    Hemoglobin  Date Value Ref Range Status  04/28/2019 12.0 11.1 - 15.9 g/dL Final   HGB  Date Value Ref Range Status  06/12/2008 13.8 11.6 - 15.9 g/dL Final          Passed - RBC in normal range and within 360 days    RBC  Date Value Ref Range Status  04/28/2019 3.86 3.77 - 5.28 x10E6/uL Final  08/25/2017 4.22 3.87 - 5.11 MIL/uL Final          Passed - WBC in normal range and within 360 days    WBC  Date Value Ref Range Status  04/28/2019 4.8 3.4 - 10.8 x10E3/uL Final  08/25/2017 6.6 4.0 - 10.5 K/uL Final          Passed - PLT in normal range and within 360 days    Platelets  Date Value Ref Range Status  04/28/2019 246 150 - 450 x10E3/uL Final          Passed - Valid encounter within last 12 months     Recent Outpatient Visits           4 months ago Encounter for [employer] annual wellness exam   [place], Deborah B, MD   6 months ago Essential hypertension   [place], Deborah B, MD   8 months ago Essential hypertension   [place], Deborah B, MD   12 months ago Pap smear for cervical cancer screening   [place], Deborah B, MD   1 year ago Essential hypertension   [place], RPH-CPP       Future Appointments             In 4 days [name], MD [facility] [place], [facility]   In 2 weeks [name], MD [place]             Signed Prescriptions Disp Refills   Fluticasone-Salmeterol (ADVAIR) 100-50 MCG/DOSE AEPB 180 each 0    Sig: INHALE 1 PUFF INTO THE LUNGS 2 (TWO) TIMES DAILY.      Pulmonology:  Combination Products Passed - 01/16/2020 10:15 AM      Passed - Valid encounter within last 12 months    Recent Outpatient Visits           4 months ago Encounter for [employer] annual wellness exam   [place] [name], MD   6 months ago Essential hypertension   [place], MD   8 months ago Essential hypertension   [place], MD   12 months ago Pap smear for cervical cancer screening   [place], Deborah B, MD   1 year ago Essential hypertension   [place], [place], RPH-CPP       Future Appointments             In 4 days [name], MD [place], [facility]   In 2 weeks [name], MD [place]              amLODipine  [facility]) 10  MG tablet 90 tablet 0    Sig: TAKE 1 TABLET AT BEDTIME      Cardiovascular:  Calcium Channel Blockers Failed - 01/16/2020 10:15 AM      Failed - Last BP in normal range    BP Readings from Last 1 Encounters:  10/05/19 (!) 153/94          Failed - Valid encounter within last 6 months    Recent Outpatient Visits           4 months ago Encounter for [employer] annual wellness exam   [place], Neoma Laming B, MD   6 months ago Essential hypertension   [place], MD   8 months ago Essential hypertension   [place], MD   12 months ago Pap smear for cervical cancer screening   [place], Deborah B, MD   1 year ago Essential hypertension   [place], [place], RPH-CPP       Future Appointments             In 4 days [name], MD [facility] [place], [facility]   In 2 weeks [name], MD [place]              atorvastatin (LIPITOR) 40 MG tablet 90 tablet 0    Sig: TAKE 1 TABLET EVERY DAY      Cardiovascular:  Antilipid - Statins Failed - 01/16/2020 10:15 AM      Failed - LDL in normal range and within 360 days    LDL Chol Calc (NIH)  Date Value Ref Range Status  04/28/2019 62 0 - 99 mg/dL Final          Passed - Total Cholesterol in normal range and within 360 days    Cholesterol, Total  Date Value Ref Range Status  04/28/2019 146 100 - 199 mg/dL Final          Passed - HDL in normal range and within 360 days    HDL  Date Value Ref Range Status  04/28/2019 70 >39 mg/dL Final          Passed - Triglycerides in normal range and within 360 days    Triglycerides  Date Value Ref Range Status  04/28/2019 69 0 - 149 mg/dL Final          Passed - Patient is not pregnant       Passed - Valid encounter within last 12 months    Recent Outpatient Visits           4 months ago Encounter for [employer] annual wellness exam   [place], Deborah B, MD   6 months ago Essential hypertension   [place], Deborah B, MD   8 months ago Essential hypertension   [place], Deborah B, MD   12 months ago Pap smear for cervical cancer screening   [place], Deborah B, MD   1 year ago Essential hypertension   [place], [place], RPH-CPP       Future Appointments             In 4 days [name],  MD [facility] [place], [facility]   In 2 weeks [name], MD [place]

## 2020-01-16 NOTE — Telephone Encounter (Signed)
Patient has upcoming appointment in 2 weeks- Rx refilled per protocol

## 2020-01-16 NOTE — Telephone Encounter (Signed)
Rx has been sent to the pharmacy electronically. ° °

## 2020-01-20 ENCOUNTER — Ambulatory Visit (INDEPENDENT_AMBULATORY_CARE_PROVIDER_SITE_OTHER): Payer: Medicare HMO | Admitting: Cardiology

## 2020-01-20 ENCOUNTER — Other Ambulatory Visit: Payer: Self-pay

## 2020-01-20 ENCOUNTER — Other Ambulatory Visit: Payer: Self-pay | Admitting: Cardiology

## 2020-01-20 ENCOUNTER — Encounter: Payer: Self-pay | Admitting: Cardiology

## 2020-01-20 VITALS — BP 150/82 | HR 56 | Temp 97.5°F | Ht 65.0 in | Wt 187.2 lb

## 2020-01-20 DIAGNOSIS — E785 Hyperlipidemia, unspecified: Secondary | ICD-10-CM | POA: Diagnosis not present

## 2020-01-20 DIAGNOSIS — I70219 Atherosclerosis of native arteries of extremities with intermittent claudication, unspecified extremity: Secondary | ICD-10-CM

## 2020-01-20 DIAGNOSIS — I10 Essential (primary) hypertension: Secondary | ICD-10-CM | POA: Diagnosis not present

## 2020-01-20 MED ORDER — HYDRALAZINE HCL 25 MG PO TABS
25.0000 mg | ORAL_TABLET | Freq: Three times a day (TID) | ORAL | 0 refills | Status: DC
Start: 2020-01-20 — End: 2020-04-09

## 2020-01-20 MED ORDER — HYDRALAZINE HCL 25 MG PO TABS
25.0000 mg | ORAL_TABLET | Freq: Two times a day (BID) | ORAL | 3 refills | Status: DC
Start: 2020-01-20 — End: 2020-04-09

## 2020-01-20 MED FILL — hydrALAZINE HCL 25 MG TABS: 25 | 7 days supply | Qty: 21 | Fill #0

## 2020-01-20 NOTE — Patient Instructions (Addendum)
Medication Instructions:  No changes except increase  Hydralazine 25 mg  Take twice a day , if blood pressure is above 150 /90 recheck in one hour if still elevate take an extra 25 mg Hydralazine tablet  *If you need a refill on your cardiac medications before your next appointment, please call your pharmacy*   Lab Work:  Not needed   Testing/Procedures:  Not needed  Follow-Up: At [facility], you and your health needs are our priority.  As part of our continuing mission to provide you with exceptional heart care, we have created designated Provider Care Teams.  These Care Teams include your primary Cardiologist (physician) and Advanced Practice Providers (APPs -  Physician Assistants and Nurse Practitioners) who all work together to provide you with the care you need, when you need it.     Your next appointment:   12 month(s)  The format for your next appointment:   In Person  Provider:   Glenetta Hew, MD   Other Instructions none

## 2020-01-20 NOTE — Progress Notes (Signed)
Primary Care Provider: [name], MD Cardiologist: Brandy Hew, MD Electrophysiologist: None  Clinic Note: Chief Complaint  Patient presents with  . Follow-up    annual  . Hypertension    HPI:    Brandy Stevenson is a 64 y.o. female with a PMH notable for poorly controlled HYPERTENSION (with intermittent episodes of accelerated hypertensive urgency), PAD (Leriche syndrome), HLD, COPD (former heavy smoker), and history of Breast Cancer (s/p lumpectomy) who presents today for annual visit/hospital follow-up.  Problem List Items Addressed This Visit    Hyperlipidemia with target LDL less than 70 (Chronic)    Last LDL from February 2021 was 24.  She is on stable dose accordingly atorvastatin.  No change.  Labs to be followed by PCP.  I suspect the patient be checked in the next couple months.  Discussed dietary modification.      Relevant Medications   hydrALAZINE (APRESOLINE) 25 MG tablet   hydrALAZINE (APRESOLINE) 25 MG tablet   Resistant hypertension - Primary (Chronic)    Remains persistent on combination of amlodipine, atorvastatin, lisinopril and hydralazine spironolactone ->   Plan will be to increase hydralazine to least 25 twice daily with perinephric echogram for additional blood pressure greater than 150/90 mmHg.      Relevant Medications   hydrALAZINE (APRESOLINE) 25 MG tablet   hydrALAZINE (APRESOLINE) 25 MG tablet   Atherosclerotic PVD with intermittent claudication (HCC) (Chronic)    Follow-up in vascular surgery.  For some reason her Pletal was stopped.  I would restart.  If she needs refills, we will have to do so but I think vascular surgery would also be appropriate.      Relevant Medications   hydrALAZINE (APRESOLINE) 25 MG tablet   hydrALAZINE (APRESOLINE) 25 MG tablet   Other Relevant Orders   EKG 12-Lead (Completed)     Brandy Stevenson was last seen on January 18, 2019 because of elevated blood pressure.  Usually ranging  in the 140-150/80-90 mmHg range.  PCP had been adjusting medications along with [facility].  This has a labile BP.  Notes off-and-on headaches, arthritis neck and back.  Taking ibuprofen followed by meloxicam.  Mild noted a swelling. -> Not feeling dizzy/fatigued, restricted by Covid lockdown's.  Gained weight.  Mild claudication.  Back, neck and hip pain.  She was seen by Dr. Gae Gallop in December 2020: Noted stable bilateral calf claudication on both sides.  Occurs with ambulation & relieved with rest.  Legs do get tired somewhat quicker, but not walking as much.  No rest pain. + ; (-) .  Not walking as much because of fear of the dog is the neighborhood.  Was interested in vascular rehab program if ever established. -> Plan was to continue medical management provided he did not develop critical limb ischemia.  If she fails medical management endovascular for limb ischemia, revascularization option limited to aortobifem bypass.  Recent Hospitalizations:   ER visit March 16, 2019: Patient presented with hypertension having not taken her lisinopril for 3 days because it did not help her.  Blood pressures usually running from 120s-140s over 70s to 80s, but will go up to the 170s off of medications. Lisinopril refilled  ER visit 09/28/2019 and 09/29/2019 -> BP was 173/97, usually in the 120s 130s.  Had a headache, but no other symptoms.  Reviewed  CV studies:    The following studies were reviewed today: (if available, images/films reviewed: From Epic Chart or  Care Everywhere) . ABIs-L EEA Dopplers 10/05/2019: ABIs increased compared to 2020: Bilateral ABI 0.71, R TBI 0.43, L TBI 0.57. ->  Previous TBI: Right 3.51, left 0.55.  Previous TBI right 0.31, left 0.32   Interval History:   Brandy Stevenson is here today for annual follow-up stating she can fairly well.  She is not as active as she would like to be.  She is very scared of going for walks in her  neighborhood because all the dogs.  She does do leg exercises at home, but really does not do Much exercise or talking.  She elevates her feet edema.  She said that her blood pressures have been doing pretty well at home and that she is actually trying to lose weight with adjusting her diet.  She has baseline exertional dyspnea with COPD but is stable.  No resting dyspnea.  As far as her blood pressure goes, she says she can tell if she is more stressed to the goes up.  Otherwise not but mobile.  She has bilateral chest aching with deep breath - not associated with exertion.  CV Review of Symptoms (Summary): positive for - chest pain, dyspnea on exertion and labile BP.  stable claudication (not limiting) negative for - irregular heartbeat, orthopnea, palpitations, paroxysmal nocturnal dyspnea, rapid heart rate, shortness of breath or Syncope/near syncope, TIA/amaurosis fugax  The patient does not have symptoms concerning for COVID-19 infection (fever, chills, cough, or new shortness of breath).   REVIEWED OF SYSTEMS   Notable for mild exercise intolerance, that she attributes to "laziness "lack of desire to do anything.  Actually now trying to work on losing weight, but gained back pounds. Intermittent headaches but no lightheadedness or dizziness.  Back pain joint pain hip pain and neck pain.  I have reviewed and (if needed) personally updated the patient's problem list, medications, allergies, past medical and surgical history, social and family history.   PAST MEDICAL HISTORY   Past Medical History:  Diagnosis Date  . Arthritis    back problems to be evaluated by neurology next month  . Cancer ([place]) 2009   L, Breast, radiation and surgery.  [name] COPD (chronic obstructive pulmonary disease) ([place])   . Depression   . GERD (gastroesophageal reflux disease)   . Hyperlipemia   . Hypertension   . Leriche syndrome ([place])    Infrarenal aortic occlusion extending to bilateral internal iliac  arteries.  External iliacs fill via collaterals from lumbar arteries to hypogastric/internal iliac arteries -minimal disease on the right side and no disease on the left side leg arteries distally.  . Peripheral vascular disease ([place]) 2013    PAST SURGICAL HISTORY   Past Surgical History:  Procedure Laterality Date  . APPENDECTOMY  1980  . BREAST LUMPECTOMY Left    s/p radiation therapy   . BREAST SURGERY    . COLONOSCOPY  07/2015  . COLONOSCOPY WITH PROPOFOL N/A 06/26/2015   Procedure: COLONOSCOPY WITH PROPOFOL;  Surgeon: [name], MD;  Location: [facility];  Service: Endoscopy;  Laterality: N/A;  . CT CTA CORONARY W/CA SCORE W/CM &/OR WO/CM  12/10/2017   Coronary calcium score is 0.  No evidence of CAD.  [name] PERIPHERAL VASCULAR CATHETERIZATION N/A 10/22/2015   Procedure: Abdominal Aortogram w/Lower Extremity;  Surgeon: [name], MD;  Location: [facility] INVASIVE CV LAB: Aortic arch widely patent as far as innominate artery, right subclavian artery, right vertebral artery and right, common carotid.  Bilateral single renal arteries - Nonocclusive  disease. Infrarenal aorta 100% - bilateral common Iliac 100% --> B Ex Iliacs fill via Int Iliac. Leg OK  . TRANSTHORACIC ECHOCARDIOGRAM  08/2017   EF 60-65%. No RWMA.  Normal DF.  Normal valves.   . TUBAL LIGATION  1991     There is no immunization history on file for this patient.  MEDICATIONS/ALLERGIES   Current Meds  Medication Sig  . amLODipine (NORVASC) 10 MG tablet TAKE 1 TABLET AT BEDTIME  . aspirin EC 81 MG tablet Take 1 tablet (81 mg total) by mouth daily.  [name] atorvastatin (LIPITOR) 40 MG tablet TAKE 1 TABLET EVERY DAY  . carvedilol (COREG) 6.25 MG tablet TAKE 1 TABLET TWICE DAILY  . Fluticasone-Salmeterol (ADVAIR) 100-50 MCG/DOSE AEPB INHALE 1 PUFF INTO THE LUNGS 2 (TWO) TIMES DAILY.  [name] gabapentin (NEURONTIN) 300 MG capsule TAKE 1 CAPSULE AT BEDTIME  . lisinopril (ZESTRIL) 40 MG tablet TAKE 1 TABLET EVERY DAY  .  meloxicam (MOBIC) 15 MG tablet TAKE 1 TABLET EVERY DAY  . nystatin (MYCOSTATIN) 100000 UNIT/ML suspension Take 5 mLs (500,000 Units total) by mouth 4 (four) times daily. Swish and swallow  . omeprazole (PRILOSEC) 40 MG capsule TAKE 1 CAPSULE (40 MG TOTAL) BY MOUTH DAILY.  [name] spironolactone (ALDACTONE) 25 MG tablet TAKE 1 TABLET EVERY DAY  . [DISCONTINUED] albuterol (VENTOLIN HFA) 108 (90 Base) MCG/ACT inhaler INHALE 2 PUFFS EVERY 6 HOURS AS NEEDED FOR WHEEZING  . [DISCONTINUED] ammonium lactate (LAC-HYDRIN) 12 % cream Apply topically as needed for dry skin.  . [DISCONTINUED] cilostazol (PLETAL) 50 MG tablet Take 1 tablet (50 mg total) by mouth 2 (two) times daily.  . [DISCONTINUED] hydrALAZINE (APRESOLINE) 10 MG tablet TAKE 2 TABLETS TWICE DAILY    Allergies  Allergen Reactions  . Vicodin [Hydrocodone-Acetaminophen] Nausea And Vomiting and Other (See Comments)    Sweating  . Bactrim [Sulfamethoxazole-Trimethoprim]     HA and vomiting  . Incruse Ellipta [Umeclidinium Bromide]     Dry mouth  . Wellbutrin [Bupropion]     Made me feel bad    SOCIAL HISTORY/FAMILY HISTORY    Reviewed in Epic:  Pertinent findings: n/a  OBJCTIVE -PE, EKG, labs   Wt Readings from Last 3 Encounters:  02/03/20 188 lb 6.4 oz (85.5 kg)  01/20/20 187 lb 3.2 oz (84.9 kg)  10/05/19 183 lb (83 kg)    Physical Exam: BP (!) 150/82   Pulse (!) 56   Temp (!) 97.5 F (36.4 C)   Ht 5\' 5"  (1.651 m)   Wt 187 lb 3.2 oz (84.9 kg)   SpO2 97%   BMI 31.15 kg/m  Physical Exam Vitals reviewed.  Constitutional:      General: She is not in acute distress.    Appearance: Normal appearance. She is obese. She is not ill-appearing or toxic-appearing.     Comments: Well-nourished, well-groomed.  HENT:     Head: Normocephalic and atraumatic.  Neck:     Vascular: Carotid bruit (Soft bilateral) present. No hepatojugular reflux or JVD.  Cardiovascular:     Rate and Rhythm: Regular rhythm. Bradycardia present.  Occasional extrasystoles are present.    Pulses: Decreased pulses.          Dorsalis pedis pulses are 1+ on the right side and 1+ on the left side.       Posterior tibial pulses are 1+ on the right side and 1+ on the left side.     Heart sounds: S1 normal and S2 normal. Heart sounds are distant. No  murmur heard.  No friction rub. No gallop. No S4 sounds.   Pulmonary:     Effort: Pulmonary effort is normal. No respiratory distress.     Breath sounds: Normal breath sounds.  Chest:     Chest wall: No tenderness.  Musculoskeletal:        General: Swelling (trivial) present. Normal range of motion.     Cervical back: Normal range of motion and neck supple.  Neurological:     General: No focal deficit present.     Mental Status: She is alert and oriented to person, place, and time.  Psychiatric:        Mood and Affect: Mood normal.        Behavior: Behavior normal.        Thought Content: Thought content normal.        Judgment: Judgment normal.     Adult ECG Report  Rate: 56 ;  Rhythm: sinus bradycardia and Voltage, otherwise normal axis, intervals and durations.;   Narrative Interpretation: stable  Recent Labs:    Lab Results  Component Value Date   CHOL 146 04/28/2019   HDL 70 04/28/2019   LDLCALC 62 04/28/2019   TRIG 69 04/28/2019   CHOLHDL 2.1 04/28/2019   Lab Results  Component Value Date   CREATININE 0.79 04/28/2019   BUN 4 (L) 04/28/2019   NA 145 (H) 04/28/2019   K 3.7 07/08/2019   CL 104 04/28/2019   CO2 27 04/28/2019   Lab Results  Component Value Date   TSH 1.400 06/27/2016    ASSESSMENT/PLAN    Problem List Items Addressed This Visit    Hyperlipidemia with target LDL less than 70 (Chronic)    Last LDL from February 2021 was 36.  She is on stable dose accordingly atorvastatin.  No change.  Labs to be followed by PCP.  I suspect the patient be checked in the next couple months.  Discussed dietary modification.      Relevant Medications   hydrALAZINE  (APRESOLINE) 25 MG tablet   hydrALAZINE (APRESOLINE) 25 MG tablet   Resistant hypertension - Primary (Chronic)    Remains persistent on combination of amlodipine, atorvastatin, lisinopril and hydralazine spironolactone ->   Plan will be to increase hydralazine to least 25 twice daily with perinephric echogram for additional blood pressure greater than 150/90 mmHg.      Relevant Medications   hydrALAZINE (APRESOLINE) 25 MG tablet   hydrALAZINE (APRESOLINE) 25 MG tablet   Atherosclerotic PVD with intermittent claudication (HCC) (Chronic)    Follow-up in vascular surgery.  For some reason her Pletal was stopped.  I would restart.  If she needs refills, we will have to do so but I think vascular surgery would also be appropriate.      Relevant Medications   hydrALAZINE (APRESOLINE) 25 MG tablet   hydrALAZINE (APRESOLINE) 25 MG tablet   Other Relevant Orders   EKG 12-Lead (Completed)       COVID-19 Education: The signs and symptoms of COVID-19 were discussed with the patient and how to seek care for testing (follow up with PCP or arrange E-visit).   The importance of social distancing and COVID-19 vaccination was discussed today. 2 min The patient is practicing social distancing & Masking.   I spent a total of 20minutes with the patient spent in direct patient consultation.  Additional time spent with chart review  / charting (studies, outside notes, etc): 8 Total Time: 34 min   Current medicines are reviewed at  length with the patient today.  (+/- concerns) n/a  This visit occurred during the SARS-CoV-2 public health emergency.  Safety protocols were in place, including screening questions prior to the visit, additional usage of staff PPE, and extensive cleaning of exam room while observing appropriate contact time as indicated for disinfecting solutions.  Notice: This dictation was prepared with Dragon dictation along with smaller phrase technology. Any transcriptional errors that  result from this process are unintentional and may not be corrected upon review.  Patient Instructions / Medication Changes & Studies & Tests Ordered   Patient Instructions  Medication Instructions:  No changes except increase  Hydralazine 25 mg  Take twice a day , if blood pressure is above 150 /90 recheck in one hour if still elevate take an extra 25 mg Hydralazine tablet  *If you need a refill on your cardiac medications before your next appointment, please call your pharmacy*   Lab Work:  Not needed   Testing/Procedures:  Not needed  Follow-Up: At [facility], you and your health needs are our priority.  As part of our continuing mission to provide you with exceptional heart care, we have created designated Provider Care Teams.  These Care Teams include your primary Cardiologist (physician) and Advanced Practice Providers (APPs -  Physician Assistants and Nurse Practitioners) who all work together to provide you with the care you need, when you need it.     Your next appointment:   12 month(s)  The format for your next appointment:   In Person  Provider:   Glenetta Hew, MD   Other Instructions none    Studies Ordered:   Orders Placed This Encounter  Procedures  . EKG 12-Lead     Brandy Stevenson, M.D., M.S. Interventional Cardiologist   Pager # [phone] Phone # [phone] [address] [place], [place] [address]   Thank you for choosing Heartcare at [facility]!!

## 2020-02-01 ENCOUNTER — Other Ambulatory Visit: Payer: Self-pay | Admitting: Internal Medicine

## 2020-02-01 DIAGNOSIS — I70219 Atherosclerosis of native arteries of extremities with intermittent claudication, unspecified extremity: Secondary | ICD-10-CM

## 2020-02-01 DIAGNOSIS — Z760 Encounter for issue of repeat prescription: Secondary | ICD-10-CM

## 2020-02-02 ENCOUNTER — Ambulatory Visit: Payer: Medicare HMO | Admitting: Internal Medicine

## 2020-02-03 ENCOUNTER — Other Ambulatory Visit: Payer: Self-pay

## 2020-02-03 ENCOUNTER — Ambulatory Visit: Payer: Medicare HMO | Attending: Internal Medicine | Admitting: Internal Medicine

## 2020-02-03 ENCOUNTER — Other Ambulatory Visit: Payer: Self-pay | Admitting: Internal Medicine

## 2020-02-03 ENCOUNTER — Encounter: Payer: Self-pay | Admitting: Internal Medicine

## 2020-02-03 VITALS — BP 157/81 | HR 64 | Temp 97.0°F | Resp 16 | Wt 188.4 lb

## 2020-02-03 DIAGNOSIS — E669 Obesity, unspecified: Secondary | ICD-10-CM

## 2020-02-03 DIAGNOSIS — J449 Chronic obstructive pulmonary disease, unspecified: Secondary | ICD-10-CM

## 2020-02-03 DIAGNOSIS — L02211 Cutaneous abscess of abdominal wall: Secondary | ICD-10-CM | POA: Diagnosis not present

## 2020-02-03 DIAGNOSIS — Z2821 Immunization not carried out because of patient refusal: Secondary | ICD-10-CM

## 2020-02-03 DIAGNOSIS — I10 Essential (primary) hypertension: Secondary | ICD-10-CM | POA: Diagnosis not present

## 2020-02-03 DIAGNOSIS — I739 Peripheral vascular disease, unspecified: Secondary | ICD-10-CM

## 2020-02-03 MED ORDER — CILOSTAZOL 50 MG PO TABS: 50.0000 mg | ORAL_TABLET | Freq: Two times a day (BID) | ORAL | 3 refills | Status: DC

## 2020-02-03 MED ORDER — ALBUTEROL SULFATE HFA 108 (90 BASE) MCG/ACT IN AERS: 2.0000 | INHALATION_SPRAY | Freq: Four times a day (QID) | RESPIRATORY_TRACT | 6 refills | Status: DC | PRN

## 2020-02-03 MED ORDER — CLINDAMYCIN HCL 300 MG PO CAPS: 300.0000 mg | ORAL_CAPSULE | Freq: Three times a day (TID) | ORAL | 0 refills | Status: DC

## 2020-02-03 MED FILL — CLINDAMYCIN HCL 300 MG CAPS: 300 | 10 days supply | Qty: 30 | Fill #0

## 2020-02-03 MED FILL — CILOSTAZOL 50 MG TABLET: 50 | 30 days supply | Qty: 60 | Fill #0

## 2020-02-03 NOTE — Progress Notes (Signed)
Patient ID: Brandy Stevenson, female    DOB: Dec 06, 1955  MRN: [id number]  CC: Hypertension   Subjective: Brandy Stevenson is a 64 y.o. female who presents for chronic ds management Her concerns today include:  Hx of PAD, HTN, HL,formertob dep, LT breast CA (lumpectomy and XRT 2009), GERD, COPD, Trigeminal neuralgia RT, dep/anxiety, OA shoulders  PAD:  Talked about starting Pletal on last visit.  I do not see it on med list but pt says she has it at home but has not started taking.  Saw Dr. Scot Dock.  ABI in both legs showed improvement.  Legs still get tired when she walks  COPD:  Needs RF on Albuterol.  Using Advair BID as prescribed; gargles with warm water after each use.    HTN:  SBP elev today.  Not checking BP as often because she gets stressed about it.  She limits salt in the foods.  No chest pains.  BP range at home is 140-150/90s Hydralazine increased by cardiologist from 20 mg twice a day to 25 mg twice a day with the option of taking 3 times a day. Took meds already today  Obesity:  Up 5 lbs since last visit.  Eas mainly 2 meals/day.  Drinks Ginger Ale.  Not very active  Complains of having an infection on the right abdominal wall.  She thinks she was bitten by an insect about 2 weeks ago because she felt a sudden sting.  The area turned red.  She put some rubbing alcohol on it.  However over time it turned into a bump that has increased in size and started draining.  She has been dressing it and applying antibiotic ointment.  She denies any fever.  No increased pain around the area.   HM:  Plans to get COVID vaccine.  Defer on getting flu shot. Patient Active Problem List   Diagnosis Date Noted  . Essential hypertension 01/20/2020  . Tubular adenoma of colon 04/27/2019  . Cervical radiculopathy 10/01/2018  . Hypertensive retinopathy 09/08/2018  . Seasonal allergies 06/29/2018  . Callous ulcer ([place]) 03/16/2018  . Aortoiliac occlusive disease ([place]) 11/04/2017   . DOE (dyspnea on exertion) 11/03/2017  . Chronic chest wall pain 11/03/2017  . Ichthyosis 10/06/2017  . Former smoker 04/07/2017  . Primary osteoarthritis of right shoulder 04/07/2017  . Chronic midline low back pain without sciatica 04/07/2017  . Hiatal hernia 05/01/2016  . DJD (degenerative joint disease) of cervical spine 04/17/2016  . History of breast cancer 02/18/2016  . Foot callus 02/18/2016  . Anxiety and depression 05/22/2015  . Insomnia 03/15/2015  . Unspecified vitamin D deficiency 04/08/2013  . Hyperlipidemia with target LDL less than 70 04/08/2013  . Atherosclerotic PVD with intermittent claudication ([place]) 03/22/2013  . Accelerated hypertension 02/23/2013  . GERD (gastroesophageal reflux disease) 02/23/2013  . COPD (chronic obstructive pulmonary disease) ([place]) 02/23/2013     Current Outpatient Medications on File Prior to Visit  Medication Sig Dispense Refill  . albuterol (VENTOLIN HFA) 108 (90 Base) MCG/ACT inhaler INHALE 2 PUFFS EVERY 6 HOURS AS NEEDED FOR WHEEZING 54 g 6  . amLODipine (NORVASC) 10 MG tablet TAKE 1 TABLET AT BEDTIME 90 tablet 0  . aspirin EC 81 MG tablet Take 1 tablet (81 mg total) by mouth daily. 90 tablet 3  . atorvastatin (LIPITOR) 40 MG tablet TAKE 1 TABLET EVERY DAY 90 tablet 0  . carvedilol (COREG) 6.25 MG tablet TAKE 1 TABLET TWICE DAILY 180 tablet 0  . Fluticasone-Salmeterol (  ADVAIR) 100-50 MCG/DOSE AEPB INHALE 1 PUFF INTO THE LUNGS 2 (TWO) TIMES DAILY. 180 each 0  . gabapentin (NEURONTIN) 300 MG capsule TAKE 1 CAPSULE AT BEDTIME 90 capsule 0  . hydrALAZINE (APRESOLINE) 25 MG tablet Take 1 tablet (25 mg total) by mouth 2 (two) times daily. May take an extra 25 mg tablet if blood pressure is above 150/90 after one hour recheck. 270 tablet 3  . hydrALAZINE (APRESOLINE) 25 MG tablet Take 1 tablet (25 mg total) by mouth 3 (three) times daily. May take an additional 25 mg tablet if blood pressure is elevated 150/90 or above after one hour recheck.  21 tablet 0  . lisinopril (ZESTRIL) 40 MG tablet TAKE 1 TABLET EVERY DAY 90 tablet 0  . meloxicam (MOBIC) 15 MG tablet TAKE 1 TABLET EVERY DAY 90 tablet 1  . nystatin (MYCOSTATIN) 100000 UNIT/ML suspension Take 5 mLs (500,000 Units total) by mouth 4 (four) times daily. Swish and swallow 100 mL 0  . omeprazole (PRILOSEC) 40 MG capsule TAKE 1 CAPSULE (40 MG TOTAL) BY MOUTH DAILY. 90 capsule 1  . spironolactone (ALDACTONE) 25 MG tablet TAKE 1 TABLET EVERY DAY 30 tablet 0   No current facility-administered medications on file prior to visit.    Allergies  Allergen Reactions  . Vicodin [Hydrocodone-Acetaminophen] Nausea And Vomiting and Other (See Comments)    Sweating  . Bactrim [Sulfamethoxazole-Trimethoprim]     HA and vomiting  . Incruse Ellipta [Umeclidinium Bromide]     Dry mouth  . Wellbutrin [Bupropion]     Made me feel bad    Social History   Socioeconomic History  . Marital status: Married    Spouse name: Not on file  . Number of children: Not on file  . Years of education: Not on file  . Highest education level: Not on file  Occupational History  . Not on file  Tobacco Use  . Smoking status: Former Smoker    Packs/day: 0.25    Years: 40.00    Pack years: 10.00    Types: Cigarettes    Quit date: 02/08/2017    Years since quitting: 2.9  . Smokeless tobacco: Never Used  Vaping Use  . Vaping Use: Never used  Substance and Sexual Activity  . Alcohol use: No  . Drug use: No  . Sexual activity: Yes  Other Topics Concern  . Not on file  Social History Narrative  . Not on file   Social Determinants of Health   Financial Resource Strain:   . Difficulty of Paying Living Expenses: Not on file  Food Insecurity:   . Worried About Charity fundraiser in the Last Year: Not on file  . Ran Out of Food in the Last Year: Not on file  Transportation Needs:   . Lack of Transportation (Medical): Not on file  . Lack of Transportation (Non-Medical): Not on file  Physical  Activity:   . Days of Exercise per Week: Not on file  . Minutes of Exercise per Session: Not on file  Stress:   . Feeling of Stress : Not on file  Social Connections:   . Frequency of Communication with Friends and Family: Not on file  . Frequency of Social Gatherings with Friends and Family: Not on file  . Attends Religious Services: Not on file  . Active Member of Clubs or Organizations: Not on file  . Attends Archivist Meetings: Not on file  . Marital Status: Not on file  Intimate  Partner Violence:   . Fear of Current or Ex-Partner: Not on file  . Emotionally Abused: Not on file  . Physically Abused: Not on file  . Sexually Abused: Not on file    Family History  Problem Relation Age of Onset  . Hyperlipidemia Mother   . Hypertension Mother   . Heart disease Mother        Atrial Fib.  . Hyperlipidemia Sister   . Hypertension Sister   . Heart disease Sister        Before age 53,  CHF  . Heart attack Brother   . Hyperlipidemia Brother   . Hypertension Brother   . Heart disease Brother   . Hypertension Daughter     Past Surgical History:  Procedure Laterality Date  . APPENDECTOMY  1980  . BREAST LUMPECTOMY Left    s/p radiation therapy   . BREAST SURGERY    . COLONOSCOPY  07/2015  . COLONOSCOPY WITH PROPOFOL N/A 06/26/2015   Procedure: COLONOSCOPY WITH PROPOFOL;  Surgeon: [name], MD;  Location: [facility];  Service: Endoscopy;  Laterality: N/A;  . CT CTA CORONARY W/CA SCORE W/CM &/OR WO/CM  12/10/2017   Coronary calcium score is 0.  No evidence of CAD.  [name] PERIPHERAL VASCULAR CATHETERIZATION N/A 10/22/2015   Procedure: Abdominal Aortogram w/Lower Extremity;  Surgeon: [name], MD;  Location: [facility] INVASIVE CV LAB: Aortic arch widely patent as far as innominate artery, right subclavian artery, right vertebral artery and right, common carotid.  Bilateral single renal arteries - Nonocclusive disease. Infrarenal aorta 100% - bilateral common  Iliac 100% --> B Ex Iliacs fill via Int Iliac. Leg OK  . TRANSTHORACIC ECHOCARDIOGRAM  08/2017   EF 60-65%. No RWMA.  Normal DF.  Normal valves.   . TUBAL LIGATION  1991    ROS: Review of Systems Negative except as stated above  PHYSICAL EXAM: BP (!) 157/81   Pulse 64   Temp (!) 97 F (36.1 C)   Resp 16   Wt 188 lb 6.4 oz (85.5 kg)   SpO2 97%   BMI 31.35 kg/m   Wt Readings from Last 3 Encounters:  02/03/20 188 lb 6.4 oz (85.5 kg)  01/20/20 187 lb 3.2 oz (84.9 kg)  10/05/19 183 lb (83 kg)    Physical Exam  General appearance - alert, well appearing, and in no distress Mental status - normal mood, behavior, speech, dress, motor activity, and thought processes Mouth - mucous membranes moist, pharynx normal without lesions Neck - supple, no significant adenopathy Chest - clear to auscultation, no wheezes, rales or rhonchi, symmetric air entry Heart - normal rate, regular rhythm, normal S1, S2, no murmurs, rubs, clicks or gallops Extremities -no lower extremity edema.   Skin -abdomen: Patient has about a 2 cm red slightly raised area to the right mid quadrant of the abdomen.  No surrounding induration or erythema.  No fluctuance.  Small amount of pus expressed.  See picture below     CMP Latest Ref Rng & Units 07/08/2019 04/28/2019 07/06/2018  Glucose 65 - 99 mg/dL - 87 -  BUN 8 - 27 mg/dL - 4(L) -  Creatinine 0.57 - 1.00 mg/dL - 0.79 -  Sodium 134 - 144 mmol/L - 145(H) -  Potassium 3.5 - 5.2 mmol/L 3.7 3.6 3.5  Chloride 96 - 106 mmol/L - 104 -  CO2 20 - 29 mmol/L - 27 -  Calcium 8.7 - 10.3 mg/dL - 9.2 -  Total Protein 6.0 -  8.5 g/dL - 7.5 -  Total Bilirubin 0.0 - 1.2 mg/dL - 0.3 -  Alkaline Phos 39 - 117 IU/L - 95 -  AST 0 - 40 IU/L - 37 -  ALT 0 - 32 IU/L - 45(H) -   Lipid Panel     Component Value Date/Time   CHOL 146 04/28/2019 1222   TRIG 69 04/28/2019 1222   HDL 70 04/28/2019 1222   CHOLHDL 2.1 04/28/2019 1222   CHOLHDL 2.8 07/27/2014 1207   VLDL 32  07/27/2014 1207   LDLCALC 62 04/28/2019 1222    CBC    Component Value Date/Time   WBC 4.8 04/28/2019 1222   WBC 6.6 08/25/2017 0118   RBC 3.86 04/28/2019 1222   RBC 4.22 08/25/2017 0118   HGB 12.0 04/28/2019 1222   HGB 13.8 06/12/2008 0939   HCT 35.6 04/28/2019 1222   HCT 40.2 06/12/2008 0939   PLT 246 04/28/2019 1222   MCV 92 04/28/2019 1222   MCV 90.1 06/12/2008 0939   MCH 31.1 04/28/2019 1222   MCH 30.3 08/25/2017 0118   MCHC 33.7 04/28/2019 1222   MCHC 32.4 08/25/2017 0118   RDW 13.2 04/28/2019 1222   RDW 13.9 06/12/2008 0939   LYMPHSABS 0.4 (L) 03/10/2017 1335   LYMPHSABS 1.5 06/12/2008 0939   MONOABS 0.5 03/23/2016 2308   MONOABS 0.6 06/12/2008 0939   EOSABS 0.1 03/10/2017 1335   BASOSABS 0.0 03/10/2017 1335   BASOSABS 0.0 06/12/2008 0939    ASSESSMENT AND PLAN: 1. Essential hypertension Not at goal.  Advised patient to take the hydralazine 25 mg 3 times a day.  Continue other medications.  Continue to monitor home blood pressure with goal being 130/80 or lower.  2. Abscess of abdominal wall Looks like this has already drained on its own.  Advised patient however if this increases in size, becomes painful she should be seen in the emergency room to have this incised/drained. I have prescribed clindamycin.  She is allergic to Bactrim. Instructed on daily dressing changes using clean 4 x 4 gauze.  Follow-up in 1 week to make sure this is resolved - clindamycin (CLEOCIN) 300 MG capsule; Take 1 capsule (300 mg total) by mouth 3 (three) times daily.  Dispense: 30 capsule; Refill: 0  3. Chronic obstructive pulmonary disease, unspecified COPD type (HCC) - albuterol (VENTOLIN HFA) 108 (90 Base) MCG/ACT inhaler; Inhale 2 puffs into the lungs every 6 (six) hours as needed. for wheezing  Dispense: 54 g; Refill: 6  4. PAD (peripheral artery disease) ([place]) Patient will check to make sure she has the prescription for Pletal at home.  If not I have sent a prescription to the  pharmacy - cilostazol (PLETAL) 50 MG tablet; Take 1 tablet (50 mg total) by mouth 2 (two) times daily.  Dispense: 60 tablet; Refill: 3  5. Obesity (BMI 30.0-34.9) Dietary counseling given.  Encouraged her to eliminate sugary drinks completely from her diet including sodas.  Printed information given  6. Influenza vaccine refused Advised.  Patient wants to hold off for now.     Patient was given the opportunity to ask questions.  Patient verbalized understanding of the plan and was able to repeat key elements of the plan.   No orders of the defined types were placed in this encounter.    Requested Prescriptions    No prescriptions requested or ordered in this encounter    No follow-ups on file.  Karle Plumber, MD, FACP

## 2020-02-03 NOTE — Patient Instructions (Addendum)
Do dressing changes to the abscessed area on your abdomen at least once daily as instructed.  I have prescribed clindamycin antibiotics for you to take 3 times daily for 10 days.  If this area gets larger or becomes painful, please be seen in the emergency room to have this lanced/drain.  Obesity, Adult Obesity is having too much body fat. Being obese means that your weight is more than what is healthy for you. BMI is a number that explains how much body fat you have. If you have a BMI of 30 or more, you are obese. Obesity is often caused by eating or drinking more calories than your body uses. Changing your lifestyle can help you lose weight. Obesity can cause serious health problems, such as:  Stroke.  Coronary artery disease (CAD).  Type 2 diabetes.  Some types of cancer, including cancers of the colon, breast, uterus, and gallbladder.  Osteoarthritis.  High blood pressure (hypertension).  High cholesterol.  Sleep apnea.  Gallbladder stones.  Infertility problems. What are the causes?  Eating meals each day that are high in calories, sugar, and fat.  Being born with genes that may make you more likely to become obese.  Having a medical condition that causes obesity.  Taking certain medicines.  Sitting a lot (having a sedentary lifestyle).  Not getting enough sleep.  Drinking a lot of drinks that have sugar in them. What increases the risk?  Having a family history of obesity.  Being an [place] American woman.  Being a Hispanic man.  Living in an area with limited access to: ? [place]ilda [place], recreation centers, or sidewalks. ? Healthy food choices, such as grocery stores and farmers' markets. What are the signs or symptoms? The main sign is having too much body fat. How is this treated?  Treatment for this condition often includes changing your lifestyle. Treatment may include: ? Changing your diet. This may include making a healthy meal plan. ? Exercise. This may  include activity that causes your heart to beat faster (aerobic exercise) and strength training. Work with your doctor to design a program that works for you. ? Medicine to help you lose weight. This may be used if you are not able to lose 1 pound a week after 6 weeks of healthy eating and more exercise. ? Treating conditions that cause the obesity. ? Surgery. Options may include gastric banding and gastric bypass. This may be done if:  Other treatments have not helped to improve your condition.  You have a BMI of 40 or higher.  You have life-threatening health problems related to obesity. Follow these instructions at home: Eating and drinking   Follow advice from your doctor about what to eat and drink. Your doctor may tell you to: ? Limit fast food, sweets, and processed snack foods. ? Choose low-fat options. For example, choose low-fat milk instead of whole milk. ? Eat 5 or more servings of fruits or vegetables each day. ? Eat at home more often. This gives you more control over what you eat. ? Choose healthy foods when you eat out. ? Learn to read food labels. This will help you learn how much food is in 1 serving. ? Keep low-fat snacks available. ? Avoid drinks that have a lot of sugar in them. These include soda, fruit juice, iced tea with sugar, and flavored milk.  Drink enough water to keep your pee (urine) pale yellow.  Do not go on fad diets. Physical activity  Exercise often, as told  by your doctor. Most adults should get up to 150 minutes of moderate-intensity exercise every week.Ask your doctor: ? What types of exercise are safe for you. ? How often you should exercise.  Warm up and stretch before being active.  Do slow stretching after being active (cool down).  Rest between times of being active. Lifestyle  Work with your doctor and a food expert (dietitian) to set a weight-loss goal that is best for you.  Limit your screen time.  Find ways to reward  yourself that do not involve food.  Do not drink alcohol if: ? Your doctor tells you not to drink. ? You are pregnant, may be pregnant, or are planning to become pregnant.  If you drink alcohol: ? Limit how much you use to:  0-1 drink a day for women.  0-2 drinks a day for men. ? Be aware of how much alcohol is in your drink. In the [place] one drink equals one 12 oz bottle of beer (355 mL), one 5 oz glass of wine (148 mL), or one 1 oz glass of hard liquor (44 mL). General instructions  Keep a weight-loss journal. This can help you keep track of: ? The food that you eat. ? How much exercise you get.  Take over-the-counter and prescription medicines only as told by your doctor.  Take vitamins and supplements only as told by your doctor.  Think about joining a support group.  Keep all follow-up visits as told by your doctor. This is important. Contact a doctor if:  You cannot meet your weight loss goal after you have changed your diet and lifestyle for 6 weeks. Get help right away if you:  Are having trouble breathing.  Are having thoughts of harming yourself. Summary  Obesity is having too much body fat.  Being obese means that your weight is more than what is healthy for you.  Work with your doctor to set a weight-loss goal.  Get regular exercise as told by your doctor. This information is not intended to replace advice given to you by your health care provider. Make sure you discuss any questions you have with your health care provider. Document Revised: 10/22/2017 Document Reviewed: 10/22/2017 Elsevier Patient Education  2020 [employer].

## 2020-02-04 ENCOUNTER — Encounter: Payer: Self-pay | Admitting: Cardiology

## 2020-02-04 NOTE — Assessment & Plan Note (Addendum)
Remains persistent on combination of amlodipine, atorvastatin, lisinopril and hydralazine spironolactone ->   Plan will be to increase hydralazine to least 25 twice daily with perinephric echogram for additional blood pressure greater than 150/90 mmHg.

## 2020-02-04 NOTE — Assessment & Plan Note (Signed)
Follow-up in vascular surgery.  For some reason her Pletal was stopped.  I would restart.  If she needs refills, we will have to do so but I think vascular surgery would also be appropriate.

## 2020-02-04 NOTE — Assessment & Plan Note (Signed)
Last LDL from February 2021 was 62.  She is on stable dose accordingly atorvastatin.  No change.  Labs to be followed by PCP.  I suspect the patient be checked in the next couple months.  Discussed dietary modification.

## 2020-02-14 ENCOUNTER — Other Ambulatory Visit: Payer: Self-pay

## 2020-02-14 ENCOUNTER — Ambulatory Visit: Payer: Medicare HMO | Attending: Internal Medicine | Admitting: Internal Medicine

## 2020-02-14 ENCOUNTER — Encounter: Payer: Self-pay | Admitting: Internal Medicine

## 2020-02-14 VITALS — BP 144/78 | HR 62 | Temp 98.6°F | Resp 16 | Wt 191.6 lb

## 2020-02-14 DIAGNOSIS — R197 Diarrhea, unspecified: Secondary | ICD-10-CM | POA: Diagnosis not present

## 2020-02-14 DIAGNOSIS — L02211 Cutaneous abscess of abdominal wall: Secondary | ICD-10-CM

## 2020-02-14 DIAGNOSIS — N62 Hypertrophy of breast: Secondary | ICD-10-CM

## 2020-02-14 NOTE — Patient Instructions (Signed)
Let me know if you change your mind about wanting to be referred to a plastic surgeon to be considered for breast reduction surgery.  Please give a stool sample today so that we can check and make sure that your diarrhea is not being caused by an organism called C. difficile.  If the stool comes back positive, we can treat this.  In the meantime you should push fluids to prevent yourself from being dehydrated.  After you have given the stool sample you can try taking Pepto-Bismol.

## 2020-02-14 NOTE — Progress Notes (Signed)
Patient ID: Brandy Stevenson, female    DOB: 1955/05/11  MRN: [id number]  CC: Follow-up (1 week)   Subjective: Brandy Stevenson is a 64 y.o. female who presents for f/u abscess abdominal wall Her concerns today include:  Hx of PAD, HTN, HL,formertob dep, LT breast CA (lumpectomy and XRT 2009), GERD, COPD, Trigeminal neuralgia RT, dep/anxiety, OA shoulders  Had small abscess of abdominal wall on last visit Rxn [place] x 10 days The abscess has resolved. Started having diarrhea after taking clindamycin for 6 days.  Stopped taking 2 days ago.  Still having diarrhea.  Had 3 loose stools yesterday and about 2 today.  Rectum feels raw.  No fever or abdominal pain   She is wanting to know if there is any cream or something that I can prescribe to reduce breast size.  She has large breasts.  Right is larger than the left.  She states that after having XRT to the left breast, it shrink the size of the left.  Wears 44 bra cup.  She reports a strain on her shoulder from pendulous breasts.  Breasts also requests on the upper abdomen.  Patient Active Problem List   Diagnosis Date Noted  . Influenza vaccine refused 02/03/2020  . Resistant hypertension 01/20/2020  . Tubular adenoma of colon 04/27/2019  . Cervical radiculopathy 10/01/2018  . Hypertensive retinopathy 09/08/2018  . Seasonal allergies 06/29/2018  . Aortoiliac occlusive disease ([place]) 11/04/2017  . DOE (dyspnea on exertion) 11/03/2017  . Chronic chest wall pain 11/03/2017  . Ichthyosis 10/06/2017  . Former smoker 04/07/2017  . Primary osteoarthritis of right shoulder 04/07/2017  . Chronic midline low back pain without sciatica 04/07/2017  . Hiatal hernia 05/01/2016  . DJD (degenerative joint disease) of cervical spine 04/17/2016  . History of breast cancer 02/18/2016  . Foot callus 02/18/2016  . Anxiety and depression 05/22/2015  . Insomnia 03/15/2015  . Unspecified vitamin D deficiency 04/08/2013  . Hyperlipidemia with  target LDL less than 70 04/08/2013  . Atherosclerotic PVD with intermittent claudication ([place]) 03/22/2013  . Accelerated hypertension 02/23/2013  . GERD (gastroesophageal reflux disease) 02/23/2013  . COPD (chronic obstructive pulmonary disease) ([place]) 02/23/2013     Current Outpatient Medications on File Prior to Visit  Medication Sig Dispense Refill  . albuterol (VENTOLIN HFA) 108 (90 Base) MCG/ACT inhaler Inhale 2 puffs into the lungs every 6 (six) hours as needed. for wheezing 54 g 6  . amLODipine (NORVASC) 10 MG tablet TAKE 1 TABLET AT BEDTIME 90 tablet 0  . aspirin EC 81 MG tablet Take 1 tablet (81 mg total) by mouth daily. 90 tablet 3  . atorvastatin (LIPITOR) 40 MG tablet TAKE 1 TABLET EVERY DAY 90 tablet 0  . carvedilol (COREG) 6.25 MG tablet TAKE 1 TABLET TWICE DAILY 180 tablet 0  . cilostazol (PLETAL) 50 MG tablet Take 1 tablet (50 mg total) by mouth 2 (two) times daily. 60 tablet 3  . clindamycin (CLEOCIN) 300 MG capsule Take 1 capsule (300 mg total) by mouth 3 (three) times daily. 30 capsule 0  . Fluticasone-Salmeterol (ADVAIR) 100-50 MCG/DOSE AEPB INHALE 1 PUFF INTO THE LUNGS 2 (TWO) TIMES DAILY. 180 each 0  . gabapentin (NEURONTIN) 300 MG capsule TAKE 1 CAPSULE AT BEDTIME 90 capsule 0  . hydrALAZINE (APRESOLINE) 25 MG tablet Take 1 tablet (25 mg total) by mouth 2 (two) times daily. May take an extra 25 mg tablet if blood pressure is above 150/90 after one hour recheck. 270 tablet 3  .  hydrALAZINE (APRESOLINE) 25 MG tablet Take 1 tablet (25 mg total) by mouth 3 (three) times daily. May take an additional 25 mg tablet if blood pressure is elevated 150/90 or above after one hour recheck. 21 tablet 0  . lisinopril (ZESTRIL) 40 MG tablet TAKE 1 TABLET EVERY DAY 90 tablet 0  . meloxicam (MOBIC) 15 MG tablet TAKE 1 TABLET EVERY DAY 90 tablet 1  . nystatin (MYCOSTATIN) 100000 UNIT/ML suspension Take 5 mLs (500,000 Units total) by mouth 4 (four) times daily. Swish and swallow 100 mL 0  .  omeprazole (PRILOSEC) 40 MG capsule TAKE 1 CAPSULE (40 MG TOTAL) BY MOUTH DAILY. 90 capsule 1  . spironolactone (ALDACTONE) 25 MG tablet TAKE 1 TABLET EVERY DAY 30 tablet 0   No current facility-administered medications on file prior to visit.    Allergies  Allergen Reactions  . Vicodin [Hydrocodone-Acetaminophen] Nausea And Vomiting and Other (See Comments)    Sweating  . Bactrim [Sulfamethoxazole-Trimethoprim]     HA and vomiting  . Incruse Ellipta [Umeclidinium Bromide]     Dry mouth  . Wellbutrin [Bupropion]     Made me feel bad    Social History   Socioeconomic History  . Marital status: Married    Spouse name: Not on file  . Number of children: Not on file  . Years of education: Not on file  . Highest education level: Not on file  Occupational History  . Not on file  Tobacco Use  . Smoking status: Former Smoker    Packs/day: 0.25    Years: 40.00    Pack years: 10.00    Types: Cigarettes    Quit date: 02/08/2017    Years since quitting: 3.0  . Smokeless tobacco: Never Used  Vaping Use  . Vaping Use: Never used  Substance and Sexual Activity  . Alcohol use: No  . Drug use: No  . Sexual activity: Yes  Other Topics Concern  . Not on file  Social History Narrative  . Not on file   Social Determinants of Health   Financial Resource Strain: Not on file  Food Insecurity: Not on file  Transportation Needs: Not on file  Physical Activity: Not on file  Stress: Not on file  Social Connections: Not on file  Intimate Partner Violence: Not on file    Family History  Problem Relation Age of Onset  . Hyperlipidemia Mother   . Hypertension Mother   . Heart disease Mother        Atrial Fib.  . Hyperlipidemia Sister   . Hypertension Sister   . Heart disease Sister        Before age 44,  CHF  . Heart attack Brother   . Hyperlipidemia Brother   . Hypertension Brother   . Heart disease Brother   . Hypertension Daughter     Past Surgical History:  Procedure  Laterality Date  . APPENDECTOMY  1980  . BREAST LUMPECTOMY Left    s/p radiation therapy   . BREAST SURGERY    . COLONOSCOPY  07/2015  . COLONOSCOPY WITH PROPOFOL N/A 06/26/2015   Procedure: COLONOSCOPY WITH PROPOFOL;  Surgeon: [name], MD;  Location: [facility];  Service: Endoscopy;  Laterality: N/A;  . CT CTA CORONARY W/CA SCORE W/CM &/OR WO/CM  12/10/2017   Coronary calcium score is 0.  No evidence of CAD.  [name] PERIPHERAL VASCULAR CATHETERIZATION N/A 10/22/2015   Procedure: Abdominal Aortogram w/Lower Extremity;  Surgeon: [name], MD;  Location:  [facility]AS[facility]: Aortic arch widely patent as far as innominate artery, right subclavian artery, right vertebral artery and right, common carotid.  Bilateral single renal arteries - Nonocclusive disease. Infrarenal aorta 100% - bilateral common Iliac 100% --> B Ex Iliacs fill via Int Iliac. Leg OK  . TRANSTHORACIC ECHOCARDIOGRAM  08/2017   EF 60-65%. No RWMA.  Normal DF.  Normal valves.   . TUBAL LIGATION  1991    ROS: Review of Systems Negative except as stated above  PHYSICAL EXAM: BP (!) 144/78   Pulse 62   Temp 98.6 F (37 C)   Resp 16   Wt 191 lb 9.6 oz (86.9 kg)   SpO2 95%   BMI 31.88 kg/m   Physical Exam  General appearance - alert, well appearing, and in no distress Mental status - normal mood, behavior, speech, dress, motor activity, and thought processes Abdomen -area of abscess that was on the right midportion of the abdominal wall has resolved.  There is some peeling of skin around the area but no fluctuance or erythema.  No drainage. Breasts -breasts are pendulous with right being larger than the left.   CMP Latest Ref Rng & Units 07/08/2019 04/28/2019 07/06/2018  Glucose 65 - 99 mg/dL - 87 -  BUN 8 - 27 mg/dL - 4(L) -  Creatinine 0.57 - 1.00 mg/dL - 0.79 -  Sodium 134 - 144 mmol/L - 145(H) -  Potassium 3.5 - 5.2 mmol/L 3.7 3.6 3.5  Chloride 96 - 106 mmol/L - 104 -  CO2 20 - 29 mmol/L - 27 -   Calcium 8.7 - 10.3 mg/dL - 9.2 -  Total Protein 6.0 - 8.5 g/dL - 7.5 -  Total Bilirubin 0.0 - 1.2 mg/dL - 0.3 -  Alkaline Phos 39 - 117 IU/L - 95 -  AST 0 - 40 IU/L - 37 -  ALT 0 - 32 IU/L - 45(H) -   Lipid Panel     Component Value Date/Time   CHOL 146 04/28/2019 1222   TRIG 69 04/28/2019 1222   HDL 70 04/28/2019 1222   CHOLHDL 2.1 04/28/2019 1222   CHOLHDL 2.8 07/27/2014 1207   VLDL 32 07/27/2014 1207   LDLCALC 62 04/28/2019 1222    CBC    Component Value Date/Time   WBC 4.8 04/28/2019 1222   WBC 6.6 08/25/2017 0118   RBC 3.86 04/28/2019 1222   RBC 4.22 08/25/2017 0118   HGB 12.0 04/28/2019 1222   HGB 13.8 06/12/2008 0939   HCT 35.6 04/28/2019 1222   HCT 40.2 06/12/2008 0939   PLT 246 04/28/2019 1222   MCV 92 04/28/2019 1222   MCV 90.1 06/12/2008 0939   MCH 31.1 04/28/2019 1222   MCH 30.3 08/25/2017 0118   MCHC 33.7 04/28/2019 1222   MCHC 32.4 08/25/2017 0118   RDW 13.2 04/28/2019 1222   RDW 13.9 06/12/2008 0939   LYMPHSABS 0.4 (L) 03/10/2017 1335   LYMPHSABS 1.5 06/12/2008 0939   MONOABS 0.5 03/23/2016 2308   MONOABS 0.6 06/12/2008 0939   EOSABS 0.1 03/10/2017 1335   BASOSABS 0.0 03/10/2017 1335   BASOSABS 0.0 06/12/2008 0939    ASSESSMENT AND PLAN: 1. Abscess of abdominal wall Resolved.  2. Acute diarrhea Concern for C. difficile given that she was on clindamycin.  I informed her about C. difficile and the potential for worsening symptoms.  I recommend that she give [place] a stool sample today. - Cdiff NAA+O+P+Stool Culture  3. Large breasts Advised patient that I  am not aware of any cream that can shrink the size of the breast.  I recommend referral to plastic surgeon to be considered for breast reduction.  Patient wants to hold off on that for now.  If she changes her mind she will let me know.  Patient was given the opportunity to ask questions.  Patient verbalized understanding of the plan and was able to repeat key elements of the plan.   No orders  of the defined types were placed in this encounter.    Requested Prescriptions    No prescriptions requested or ordered in this encounter    No follow-ups on file.  Karle Plumber, MD, FACP

## 2020-03-05 MED FILL — ALBUTEROL SULFATE HFA 108 (: 108 (90 BAS | 25 days supply | Qty: 18 | Fill #0

## 2020-03-08 ENCOUNTER — Other Ambulatory Visit: Payer: Self-pay | Admitting: Internal Medicine

## 2020-03-08 DIAGNOSIS — I70219 Atherosclerosis of native arteries of extremities with intermittent claudication, unspecified extremity: Secondary | ICD-10-CM

## 2020-03-08 DIAGNOSIS — I10 Essential (primary) hypertension: Secondary | ICD-10-CM

## 2020-03-08 MED ORDER — SPIRONOLACTONE 25 MG PO TABS: 25.0000 mg | ORAL_TABLET | Freq: Every day | ORAL | 0 refills | Status: DC

## 2020-03-08 MED ORDER — GABAPENTIN 300 MG PO CAPS: 300.0000 mg | ORAL_CAPSULE | Freq: Every day | ORAL | 0 refills | Status: DC

## 2020-03-08 MED FILL — GABAPENTIN 300 MG CAPSULE: 300 | 90 days supply | Qty: 90 | Fill #0

## 2020-03-08 MED FILL — SPIRONOLACTONE 25 MG TABLET: 25 | 30 days supply | Qty: 30 | Fill #0

## 2020-03-08 NOTE — Telephone Encounter (Signed)
Medication Refill - Medication:  Gabapentin 300 mg and spironolactone 25 mg  Has the patient contacted their pharmacy? No. Pt no longer use [facility] due to new insurance. Pt has medicaid and now [employer] ID [id number] Preferred Pharmacy (with phone number or street name): Pt would [facility] rxs to go to [facility]  Agent: Please be advised that RX refills may take up to 3 business days. We ask that you follow-up with your pharmacy.

## 2020-03-20 ENCOUNTER — Other Ambulatory Visit: Payer: Self-pay | Admitting: Internal Medicine

## 2020-03-20 DIAGNOSIS — I10 Essential (primary) hypertension: Secondary | ICD-10-CM

## 2020-03-20 DIAGNOSIS — Z760 Encounter for issue of repeat prescription: Secondary | ICD-10-CM

## 2020-03-20 MED ORDER — OMEPRAZOLE 40 MG PO CPDR: 40.0000 mg | DELAYED_RELEASE_CAPSULE | Freq: Every day | ORAL | 0 refills | Status: DC

## 2020-03-20 MED ORDER — MELOXICAM 15 MG PO TABS: 15.0000 mg | ORAL_TABLET | Freq: Every day | ORAL | 0 refills | Status: DC

## 2020-03-20 MED ORDER — LISINOPRIL 40 MG PO TABS: 40.0000 mg | ORAL_TABLET | Freq: Every day | ORAL | 0 refills | Status: DC

## 2020-03-20 MED FILL — LISINOPRIL 40 MG TABLET: 40 | 90 days supply | Qty: 90 | Fill #0

## 2020-03-20 MED FILL — OMEPRAZOLE DR 40 MG CAPSULE: 40 | 90 days supply | Qty: 90 | Fill #0

## 2020-03-20 MED FILL — MELOXICAM 15 MG TABLET: 15 | 90 days supply | Qty: 90 | Fill #0

## 2020-03-20 NOTE — Telephone Encounter (Signed)
Medication Refill - Medication: omeprazole (PRILOSEC) 40 MG capsule  meloxicam (MOBIC) 15 MG tablet  lisinopril (ZESTRIL) 40 MG tablet Pt needs these from [facility] pharmacy but she need all her Medications refilled and sent to her new preferred pharmacy [facility] / please advise    Has the patient contacted their pharmacy? Yes.   (Agent: If no, request that the patient contact the pharmacy for the refill.) (Agent: If yes, when and what did the pharmacy advise?)[facility] has sent request with no response   Preferred Pharmacy (with phone number or street name): for all medications besides the three she needs now  [place], [address], [address] Phone:  [phone]  Fax:  [phone]       Agent: Please be advised that RX refills may take up to 3 business days. We ask that you follow-up with your pharmacy.

## 2020-04-05 ENCOUNTER — Other Ambulatory Visit: Payer: Self-pay | Admitting: Internal Medicine

## 2020-04-05 DIAGNOSIS — J449 Chronic obstructive pulmonary disease, unspecified: Secondary | ICD-10-CM

## 2020-04-05 MED ORDER — FLUTICASONE-SALMETEROL 100-50 MCG/DOSE IN AEPB: 1.0000 | INHALATION_SPRAY | Freq: Two times a day (BID) | RESPIRATORY_TRACT | 0 refills | Status: DC

## 2020-04-05 MED ORDER — NYSTATIN 100000 UNIT/ML MT SUSP: 5.0000 mL | Freq: Four times a day (QID) | OROMUCOSAL | 0 refills | Status: DC

## 2020-04-05 MED FILL — ALBUTEROL SULFATE HFA 108 (: 108 (90 BAS | 25 days supply | Qty: 18 | Fill #1

## 2020-04-05 MED FILL — FLUTICASONE-SALMETEROL 100-: 100-50 | 90 days supply | Qty: 180 | Fill #0

## 2020-04-05 NOTE — Telephone Encounter (Signed)
Copied from [place] [id number]. Topic: Quick Communication - Rx Refill/Question >> Apr 05, 2020  8:26 AM Leward Quan A wrote: Medication: Fluticasone-Salmeterol (ADVAIR) 100-50 MCG/DOSE AEPB, nystatin (MYCOSTATIN) 100000 UNIT/ML suspension  Per patient she is out and need these today  Has the patient contacted their pharmacy? Yes.   (Agent: If no, request that the patient contact the pharmacy for the refill.) (Agent: If yes, when and what did the pharmacy advise?)  Preferred Pharmacy (with phone number or street name): [place], [address]  Phone:  [phone] Fax:  [phone]     Agent: Please be advised that RX refills may take up to 3 business days. We ask that you follow-up with your pharmacy.

## 2020-04-05 NOTE — Telephone Encounter (Signed)
Requested medication (s) are due for refill today:   Yes  Requested medication (s) are on the active medication list:   Yes  Future visit scheduled:   Yes with Dr. Wynetta Emery   Last ordered: 11/29/2019 19ml, 0 refills  Clinic note:   Returned because no protocol assigned to this medication   Requested Prescriptions  Pending Prescriptions Disp Refills   nystatin (MYCOSTATIN) 100000 UNIT/ML suspension [Pharmacy Med Name: NYSTATIN 100,000 UNITS/ML S 100000 SUS] 100 mL 0    Sig: Take 5 mLs (500,000 Units total) by mouth 4 (four) times daily. Swish and swallow      Off-Protocol Failed - 04/05/2020 12:27 PM      Failed - Medication not assigned to a protocol, review manually.      Passed - Valid encounter within last 12 months    Recent Outpatient Visits           1 month ago Abscess of abdominal wall   [place], MD   2 months ago Essential hypertension   [place], MD   7 months ago Encounter for [facility] annual wellness exam   [place] [name], MD   9 months ago Essential hypertension   [place], MD   11 months ago Essential hypertension   [place], MD       Future Appointments             In 2 months Wynetta Emery Dalbert Batman, MD [place]

## 2020-04-05 NOTE — Telephone Encounter (Signed)
Requested medication (s) are due for refill today:yes  Requested medication (s) are on the active medication list: yes  Last refill:  11/29/2019  Future visit scheduled:yes  Notes to clinic:  Medication not assigned to a protocol, review manually Patient needs today    Requested Prescriptions  Pending Prescriptions Disp Refills   nystatin (MYCOSTATIN) 100000 UNIT/ML suspension 100 mL 0    Sig: Take 5 mLs (500,000 Units total) by mouth 4 (four) times daily. Swish and swallow      Off-Protocol Failed - 04/05/2020  8:38 AM      Failed - Medication not assigned to a protocol, review manually.      Passed - Valid encounter within last 12 months    Recent Outpatient Visits           1 month ago Abscess of abdominal wall   [place], MD   2 months ago Essential hypertension   [place], MD   7 months ago Encounter for [facility] annual wellness exam   [place] [name], MD   9 months ago Essential hypertension   [place] [name], MD   11 months ago Essential hypertension   [place] [name], MD       Future Appointments             In 2 months [name], MD [place]              Signed Prescriptions Disp Refills   Fluticasone-Salmeterol (ADVAIR) 100-50 MCG/DOSE AEPB 180 each 0    Sig: Inhale 1 puff into the lungs 2 (two) times daily.      Pulmonology:  Combination Products Passed - 04/05/2020  8:38 AM      Passed - Valid encounter within last 12 months    Recent Outpatient Visits           1 month ago Abscess of abdominal wall   [place], MD   2 months ago Essential hypertension   [place], MD   7  months ago Encounter for [facility] annual wellness exam   [place] [name], MD   9 months ago Essential hypertension   [place], MD   11 months ago Essential hypertension   [place], MD       Future Appointments             In 2 months Wynetta Emery Dalbert Batman, MD [place]

## 2020-04-06 ENCOUNTER — Telehealth: Payer: Self-pay | Admitting: Internal Medicine

## 2020-04-06 MED FILL — NYSTATIN 100000 UNIT/ML SUS: 100000 | 5 days supply | Qty: 100 | Fill #0

## 2020-04-06 NOTE — Telephone Encounter (Signed)
Copied from [place] [phone]. Topic: General - Other >> Apr 05, 2020  8:30 AM Leward Quan A wrote: Reason for CRM: Patient called in and want to inform Dr Wynetta Emery to please send all her maintenance medication to the mail order pharmacy so she dont have to keep calling in for her refills. Please advise

## 2020-04-09 ENCOUNTER — Other Ambulatory Visit: Payer: Self-pay | Admitting: Internal Medicine

## 2020-04-09 ENCOUNTER — Other Ambulatory Visit: Payer: Self-pay | Admitting: Pharmacist

## 2020-04-09 DIAGNOSIS — I10 Essential (primary) hypertension: Secondary | ICD-10-CM

## 2020-04-09 DIAGNOSIS — I70219 Atherosclerosis of native arteries of extremities with intermittent claudication, unspecified extremity: Secondary | ICD-10-CM

## 2020-04-09 DIAGNOSIS — J449 Chronic obstructive pulmonary disease, unspecified: Secondary | ICD-10-CM

## 2020-04-09 DIAGNOSIS — Z760 Encounter for issue of repeat prescription: Secondary | ICD-10-CM

## 2020-04-09 MED ORDER — LISINOPRIL 40 MG PO TABS: 40.0000 mg | ORAL_TABLET | Freq: Every day | ORAL | 0 refills | Status: DC

## 2020-04-09 MED ORDER — FLUTICASONE-SALMETEROL 100-50 MCG/DOSE IN AEPB: 1.0000 | INHALATION_SPRAY | Freq: Two times a day (BID) | RESPIRATORY_TRACT | 0 refills | Status: DC

## 2020-04-09 MED ORDER — CARVEDILOL 6.25 MG PO TABS: 6.2500 mg | ORAL_TABLET | Freq: Two times a day (BID) | ORAL | 0 refills | Status: DC

## 2020-04-09 MED ORDER — ATORVASTATIN CALCIUM 40 MG PO TABS: 40.0000 mg | ORAL_TABLET | Freq: Every day | ORAL | 0 refills | Status: DC

## 2020-04-09 MED ORDER — AMLODIPINE BESYLATE 10 MG PO TABS: 10.0000 mg | ORAL_TABLET | Freq: Every day | ORAL | 0 refills | Status: DC

## 2020-04-09 MED ORDER — GABAPENTIN 300 MG PO CAPS: 300.0000 mg | ORAL_CAPSULE | Freq: Every day | ORAL | 0 refills | Status: DC

## 2020-04-09 MED ORDER — OMEPRAZOLE 40 MG PO CPDR: 40.0000 mg | DELAYED_RELEASE_CAPSULE | Freq: Every day | ORAL | 0 refills | Status: DC

## 2020-04-09 MED ORDER — SPIRONOLACTONE 25 MG PO TABS: 25.0000 mg | ORAL_TABLET | Freq: Every day | ORAL | 0 refills | Status: DC

## 2020-04-09 MED ORDER — HYDRALAZINE HCL 25 MG PO TABS: 25.0000 mg | ORAL_TABLET | Freq: Two times a day (BID) | ORAL | 3 refills | Status: DC

## 2020-04-09 MED ORDER — ALBUTEROL SULFATE HFA 108 (90 BASE) MCG/ACT IN AERS: 2.0000 | INHALATION_SPRAY | Freq: Four times a day (QID) | RESPIRATORY_TRACT | 6 refills | Status: DC | PRN

## 2020-04-09 MED FILL — SPIRONOLACTONE 25 MG TABLET: 25 | 60 days supply | Qty: 60 | Fill #0

## 2020-04-09 NOTE — Telephone Encounter (Signed)
Requested Prescriptions  Pending Prescriptions Disp Refills  . spironolactone (ALDACTONE) 25 MG tablet [Pharmacy Med Name: SPIRONOLACTONE 25 MG TABLET 25 Tablet] 30 tablet 0    Sig: TAKE 1 TABLET (25 MG TOTAL) BY MOUTH DAILY.     Cardiovascular: Diuretics - Aldosterone Antagonist Failed - 04/09/2020 10:23 AM      Failed - Na in normal range and within 360 days    Sodium  Date Value Ref Range Status  04/28/2019 145 (H) 134 - 144 mmol/L Final         Failed - Last BP in normal range    BP Readings from Last 1 Encounters:  02/14/20 (!) 144/78         Passed - Cr in normal range and within 360 days    Creat  Date Value Ref Range Status  04/17/2016 0.77 0.50 - 1.05 mg/dL Final    Comment:      For patients > or = 64 years of age: The upper reference limit for Creatinine is approximately 13% higher for people identified as African-American.      Creatinine, Ser  Date Value Ref Range Status  04/28/2019 0.79 0.57 - 1.00 mg/dL Final         Passed - K in normal range and within 360 days    Potassium  Date Value Ref Range Status  07/08/2019 3.7 3.5 - 5.2 mmol/L Final         Passed - Valid encounter within last 6 months    Recent Outpatient Visits          1 month ago Abscess of abdominal wall   [place], MD   2 months ago Essential hypertension   [place], MD   7 months ago Encounter for [facility] annual wellness exam   [place] [name], MD   9 months ago Essential hypertension   [place], MD   11 months ago Essential hypertension   [place], MD      Future Appointments            In 2 months Wynetta Emery Dalbert Batman, MD [place]

## 2020-04-09 NOTE — Telephone Encounter (Signed)
Would you be able to send medications in

## 2020-04-09 NOTE — Telephone Encounter (Signed)
Rxs sent to [facility] 

## 2020-04-25 ENCOUNTER — Telehealth: Payer: Self-pay | Admitting: Internal Medicine

## 2020-04-25 NOTE — Telephone Encounter (Signed)
Copied from [place] [phone]. Topic: General - Other >> Apr 24, 2020  3:16 PM Tessa Lerner A wrote: Reason for CRM: Patient is making contact to coordinate their prescription service with the practice   Patient is trying to use a mail order service for their prescription  [facility] representatives have notified patient that they have been unable to contact practice  Patient's order with [facility] has been canceled until the matter can be resolved  [facility] provided the following numbers for the patient to provide the practice  Phone [phone] Fax [phone]  Please contact to advise

## 2020-05-10 ENCOUNTER — Telehealth: Payer: Self-pay

## 2020-05-10 ENCOUNTER — Other Ambulatory Visit: Payer: Self-pay | Admitting: Internal Medicine

## 2020-05-10 ENCOUNTER — Ambulatory Visit: Payer: Self-pay | Admitting: *Deleted

## 2020-05-10 DIAGNOSIS — L02211 Cutaneous abscess of abdominal wall: Secondary | ICD-10-CM

## 2020-05-10 MED ORDER — CLINDAMYCIN HCL 300 MG PO CAPS: 300.0000 mg | ORAL_CAPSULE | Freq: Three times a day (TID) | ORAL | 0 refills | Status: DC

## 2020-05-10 NOTE — Telephone Encounter (Signed)
Summary: Advice    Pt had a flare up on her neck / a boyle on her neck. Pt needs advice on what to do about it.      Patient is calling to report that she has reoccurance of skin abscess. She states she has swelling/redness- quarter size on L collarbone- same location as previously occurred. Call to office- they do not have appointment-but they are going to contact mobile unit for possible appointment for patient.Patient advised per office- expect call back and if not heard anything by 2:30- proceed to UC. Reason for Disposition . [1] Spreading redness around the boil AND [2] no fever  Answer Assessment - Initial Assessment Questions 1. APPEARANCE of BOIL: "What does the boil look like?"      Rising on L collar bone- 6 months since last occurence 2. LOCATION: "Where is the boil located?"     Collar bone- L 3. NUMBER: "How many boils are there?"      1 4. SIZE: "How big is the boil?" (e.g., inches, cm; compare to size of a coin or other object)     Large- quarter size 5. ONSET: "When did the boil start?"     Wednesday 6. PAIN: "Is there any pain?" If Yes, ask: "How bad is the pain?"   (Scale 1-10; or mild, moderate, severe)     No pain- but does feel it 7. FEVER: "Do you have a fever?" If Yes, ask: "What is it, how was it measured, and when did it start?"      no 8. SOURCE: "Have you been around anyone with boils or other Staph infections?" "Have you ever had boils before?"     No- recurrent 9. OTHER SYMPTOMS: "Do you have any other symptoms?" (e.g., shaking chills, weakness, rash elsewhere on body)     no 10. PREGNANCY: "Is there any chance you are pregnant?" "When was your last menstrual period?"       n/a  Protocols used: BOIL (SKIN ABSCESS)-A-AH

## 2020-05-10 NOTE — Telephone Encounter (Signed)
Will forward to provider

## 2020-05-10 NOTE — Telephone Encounter (Signed)
Reached out to patient by phone 2x's to share information for upcoming [facility] sites. LVM for the patient to contact the unit with questions.

## 2020-05-10 NOTE — Addendum Note (Signed)
Addended by: Karle Plumber B on: 05/10/2020 10:45 PM   Modules accepted: Orders

## 2020-05-11 ENCOUNTER — Encounter: Payer: Self-pay | Admitting: Emergency Medicine

## 2020-05-11 ENCOUNTER — Other Ambulatory Visit: Payer: Self-pay

## 2020-05-11 ENCOUNTER — Ambulatory Visit
Admission: EM | Admit: 2020-05-11 | Discharge: 2020-05-11 | Disposition: A | Discharge status: Home / Self Care | Payer: Medicare Other | Attending: Family Medicine | Admitting: Family Medicine

## 2020-05-11 DIAGNOSIS — L02213 Cutaneous abscess of chest wall: Secondary | ICD-10-CM

## 2020-05-11 DIAGNOSIS — L02211 Cutaneous abscess of abdominal wall: Secondary | ICD-10-CM

## 2020-05-11 MED ORDER — CLINDAMYCIN HCL 300 MG PO CAPS: 300.0000 mg | ORAL_CAPSULE | Freq: Three times a day (TID) | ORAL | 0 refills | Status: DC

## 2020-05-11 NOTE — ED Provider Notes (Signed)
[name]    CSN: [id number] Arrival date & time: 05/11/20  0907      History   Chief Complaint Chief Complaint  Patient presents with  . Abscess    HPI Brandy Stevenson is a 65 y.o. female.   HPI Patient with medical history significant for arthritis, left breast cancer and COPD presents today with concern of a possible abscess involving the left-sided chest wall which has been present for 3 days.  She has contacted her primary care provider who prescribed started her on clindamycin which patient has not picked up because prescription was called in later in the evening. Patient wishes to have prescription phoned into [facility]. She denies fever, chills, nausea, or vomiting. Past Medical History:  Diagnosis Date  . Arthritis    back problems to be evaluated by neurology next month  . Cancer ([place]) 2009   L, Breast, radiation and surgery.  [name] COPD (chronic obstructive pulmonary disease) ([place])   . Depression   . GERD (gastroesophageal reflux disease)   . Hyperlipemia   . Hypertension   . Leriche syndrome ([place])    Infrarenal aortic occlusion extending to bilateral internal iliac arteries.  External iliacs fill via collaterals from lumbar arteries to hypogastric/internal iliac arteries -minimal disease on the right side and no disease on the left side leg arteries distally.  . Peripheral vascular disease ([place]) 2013    Patient Active Problem List   Diagnosis Date Noted  . Influenza vaccine refused 02/03/2020  . Resistant hypertension 01/20/2020  . Tubular adenoma of colon 04/27/2019  . Cervical radiculopathy 10/01/2018  . Hypertensive retinopathy 09/08/2018  . Seasonal allergies 06/29/2018  . Aortoiliac occlusive disease ([place]) 11/04/2017  . DOE (dyspnea on exertion) 11/03/2017  . Chronic chest wall pain 11/03/2017  . Ichthyosis 10/06/2017  . Former smoker 04/07/2017  . Primary osteoarthritis of right shoulder 04/07/2017  . Chronic midline low back pain  without sciatica 04/07/2017  . Hiatal hernia 05/01/2016  . DJD (degenerative joint disease) of cervical spine 04/17/2016  . History of breast cancer 02/18/2016  . Foot callus 02/18/2016  . Anxiety and depression 05/22/2015  . Insomnia 03/15/2015  . Unspecified vitamin D deficiency 04/08/2013  . Hyperlipidemia with target LDL less than 70 04/08/2013  . Atherosclerotic PVD with intermittent claudication ([place]) 03/22/2013  . Accelerated hypertension 02/23/2013  . GERD (gastroesophageal reflux disease) 02/23/2013  . COPD (chronic obstructive pulmonary disease) ([place]) 02/23/2013    Past Surgical History:  Procedure Laterality Date  . APPENDECTOMY  1980  . BREAST LUMPECTOMY Left    s/p radiation therapy   . BREAST SURGERY    . COLONOSCOPY  07/2015  . COLONOSCOPY WITH PROPOFOL N/A 06/26/2015   Procedure: COLONOSCOPY WITH PROPOFOL;  Surgeon: [name], MD;  Location: [facility];  Service: Endoscopy;  Laterality: N/A;  . CT CTA CORONARY W/CA SCORE W/CM &/OR WO/CM  12/10/2017   Coronary calcium score is 0.  No evidence of CAD.  [name] PERIPHERAL VASCULAR CATHETERIZATION N/A 10/22/2015   Procedure: Abdominal Aortogram w/Lower Extremity;  Surgeon: [name], MD;  Location: [facility] INVASIVE CV LAB: Aortic arch widely patent as far as innominate artery, right subclavian artery, right vertebral artery and right, common carotid.  Bilateral single renal arteries - Nonocclusive disease. Infrarenal aorta 100% - bilateral common Iliac 100% --> B Ex Iliacs fill via Int Iliac. Leg OK  . TRANSTHORACIC ECHOCARDIOGRAM  08/2017   EF 60-65%. No RWMA.  Normal DF.  Normal valves.   [name]  TUBAL LIGATION  1991    OB History   No obstetric history on file.      Home Medications    Prior to Admission medications   Medication Sig Start Date End Date Taking? Authorizing Provider  albuterol (VENTOLIN HFA) 108 (90 Base) MCG/ACT inhaler Inhale 2 puffs into the lungs every 6 (six) hours as needed. for  wheezing 04/09/20   [name], MD  amLODipine (NORVASC) 10 MG tablet Take 1 tablet (10 mg total) by mouth at bedtime. 04/09/20   [name], MD  aspirin EC 81 MG tablet Take 1 tablet (81 mg total) by mouth daily. 10/03/16   Funches, Adriana Mccallum, MD  atorvastatin (LIPITOR) 40 MG tablet Take 1 tablet (40 mg total) by mouth daily. 04/09/20   [name], MD  carvedilol (COREG) 6.25 MG tablet Take 1 tablet (6.25 mg total) by mouth 2 (two) times daily. 04/09/20   [name], MD  cilostazol (PLETAL) 50 MG tablet Take 1 tablet (50 mg total) by mouth 2 (two) times daily. 02/03/20   [name], MD  clindamycin (CLEOCIN) 300 MG capsule Take 1 capsule (300 mg total) by mouth 3 (three) times daily. 05/10/20   [name], MD  Fluticasone-Salmeterol (ADVAIR) 100-50 MCG/DOSE AEPB Inhale 1 puff into the lungs 2 (two) times daily. 04/09/20   [name], MD  gabapentin (NEURONTIN) 300 MG capsule Take 1 capsule (300 mg total) by mouth at bedtime. 04/09/20   [name], MD  hydrALAZINE (APRESOLINE) 25 MG tablet Take 1 tablet (25 mg total) by mouth 2 (two) times daily. May take an extra 25 mg tablet if blood pressure is above 150/90 after one hour recheck. 04/09/20 07/08/20  [name], MD  lisinopril (ZESTRIL) 40 MG tablet Take 1 tablet (40 mg total) by mouth daily. 04/09/20   [name], MD  meloxicam (MOBIC) 15 MG tablet Take 1 tablet (15 mg total) by mouth daily. 03/20/20   [name], MD  nystatin (MYCOSTATIN) 100000 UNIT/ML suspension Take 5 mLs (500,000 Units total) by mouth 4 (four) times daily. Swish and swallow 04/05/20   [name], MD  omeprazole (PRILOSEC) 40 MG capsule Take 1 capsule (40 mg total) by mouth daily. 04/09/20   [name], MD  spironolactone (ALDACTONE) 25 MG tablet Take 1 tablet (25 mg total) by mouth daily. 04/09/20   [name], MD    Family History Family History  Problem Relation Age of Onset  .  Hyperlipidemia Mother   . Hypertension Mother   . Heart disease Mother        Atrial Fib.  . Hyperlipidemia Sister   . Hypertension Sister   . Heart disease Sister        Before age 67,  CHF  . Heart attack Brother   . Hyperlipidemia Brother   . Hypertension Brother   . Heart disease Brother   . Hypertension Daughter     Social History Social History   Tobacco Use  . Smoking status: Former Smoker    Packs/day: 0.25    Years: 40.00    Pack years: 10.00    Types: Cigarettes    Quit date: 02/08/2017    Years since quitting: 3.2  . Smokeless tobacco: Never Used  Vaping Use  . Vaping Use: Never used  Substance Use Topics  . Alcohol use: No  . Drug use: No     Allergies   Vicodin [hydrocodone-acetaminophen], Bactrim [sulfamethoxazole-trimethoprim],  Incruse ellipta [umeclidinium bromide], and Wellbutrin [bupropion]   Review of Systems Review of Systems Pertinent negatives listed in HPI  Physical Exam Triage Vital Signs ED Triage Vitals [05/11/20 0927]  Enc Vitals Group     BP (!) 156/75     Pulse Rate 74     Resp 18     Temp 98.3 F (36.8 C)     Temp Source Oral     SpO2 95 %     Weight      Height      Head Circumference      Peak Flow      Pain Score 7     Pain Loc      Pain Edu?      Excl. in [place]?    No data found.  Updated Vital Signs BP (!) 156/75 (BP Location: Right Arm)   Pulse 74   Temp 98.3 F (36.8 C) (Oral)   Resp 18   SpO2 95%   Visual Acuity Right Eye Distance:   Left Eye Distance:   Bilateral Distance:    Right Eye Near:   Left Eye Near:    Bilateral Near:     Physical Exam Constitutional:      Appearance: Normal appearance.  Cardiovascular:     Rate and Rhythm: Normal rate and regular rhythm.  Pulmonary:     Effort: Pulmonary effort is normal.     Breath sounds: Normal breath sounds and air entry.  Chest:    Psychiatric:        Mood and Affect: Mood is anxious.        Behavior: Behavior is cooperative.     Comments:  Anxiety related to acute pain       UC Treatments / Results  Labs (all labs ordered are listed, but only abnormal results are displayed) Labs Reviewed - No data to display  EKG   Radiology No results found.  Procedures Incision and Drainage  Date/Time: 05/11/2020 11:17 AM Performed by: Scot Jun, FNP Authorized by: Scot Jun, FNP   Consent:    Consent obtained:  Verbal   Consent given by:  Patient   Risks discussed:  Bleeding, incomplete drainage and infection   Alternatives discussed:  No treatment Universal protocol:    Patient identity confirmed:  Verbally with patient Location:    Type:  Abscess   Location:  Trunk   Trunk location:  Chest Pre-procedure details:    Skin preparation:  Povidone-iodine Anesthesia:    Anesthesia method:  Local infiltration and topical application   Topical anesthetic:  LET   Local anesthetic:  Lidocaine 1% WITH epi Procedure type:    Complexity:  Complex Procedure details:    Incision types:  Stab incision   Incision depth:  Dermal   Drainage:  Purulent   Drainage amount:  Copious   Packing materials:  None Post-procedure details:    Procedure completion:  Tolerated with difficulty Comments:     Procedure discontinued as patient was unable to tolerate additional drainage.    (including critical care time)  Medications Ordered in UC Medications - No data to display  Initial Impression / Assessment and Plan / UC Course  I have reviewed the triage vital signs and the nursing notes.  Pertinent labs & imaging results that were available during my care of the patient were reviewed by me and considered in my medical decision making (see chart for details).     I&D performed. Procedure discontinued after  patient became severely anxious and unable to tolerate further drainage of abscess. Patient advised to start clindamycin and return in 3 days for wound check. Red flags discussed  Final Clinical Impressions(s)  / UC Diagnoses   Final diagnoses:  Cutaneous abscess of chest wall     Discharge Instructions     Start Clindamycin today. Leave dressing in place until prior to bedtime and wash with regular soap and water and re bandage. Return on Monday for recheck.    ED Prescriptions    Medication Sig Dispense Auth. Provider   clindamycin (CLEOCIN) 300 MG capsule Take 1 capsule (300 mg total) by mouth 3 (three) times daily. 21 capsule Scot Jun, FNP     PDMP not reviewed this encounter.   Scot Jun, FNP 05/12/20 1510

## 2020-05-11 NOTE — Telephone Encounter (Signed)
Contacted pt to go over Dr. Wynetta Emery response pt is at the urgent care and they are going lance it

## 2020-05-11 NOTE — Discharge Instructions (Signed)
Start Clindamycin today. Leave dressing in place until prior to bedtime and wash with regular soap and water and re bandage. Return on Monday for recheck.

## 2020-05-11 NOTE — ED Triage Notes (Signed)
Pt here for abscess to left side of chest x 3 days with redness and swelling

## 2020-05-14 ENCOUNTER — Ambulatory Visit
Admission: EM | Admit: 2020-05-14 | Discharge: 2020-05-14 | Disposition: A | Discharge status: Home / Self Care | Payer: Medicare Other

## 2020-05-15 ENCOUNTER — Ambulatory Visit
Admission: EM | Admit: 2020-05-15 | Discharge: 2020-05-15 | Disposition: A | Discharge status: Home / Self Care | Payer: Medicare Other

## 2020-05-15 ENCOUNTER — Other Ambulatory Visit: Payer: Self-pay

## 2020-05-15 ENCOUNTER — Encounter: Payer: Self-pay | Admitting: Emergency Medicine

## 2020-05-15 DIAGNOSIS — L02213 Cutaneous abscess of chest wall: Secondary | ICD-10-CM

## 2020-05-15 DIAGNOSIS — Z09 Encounter for follow-up examination after completed treatment for conditions other than malignant neoplasm: Secondary | ICD-10-CM

## 2020-05-15 NOTE — Discharge Instructions (Signed)
Complete course of clindamycin Continue warm compresses Follow-up if symptoms returning or worsening

## 2020-05-15 NOTE — ED Provider Notes (Signed)
[name]    CSN: [id number] Arrival date & time: 05/15/20  0913      History   Chief Complaint Chief Complaint  Patient presents with  . Follow-up    HPI Brandy Stevenson is a 65 y.o. female presenting today for evaluation of an abscess.  Patient was seen 3 days ago for left sided chest wall abscess which had been present for 3 days.  She was started on clindamycin by her PCP, but had not started at time of prior evaluation.  I&D was initially going to be performed, but procedure terminated after patient became very anxious.  Patient was encouraged to start clindamycin and return for recheck.  Patient reports improvement in her pain and symptoms.  Feels symptoms are moving in the right direction.   HPI  Past Medical History:  Diagnosis Date  . Arthritis    back problems to be evaluated by neurology next month  . Cancer ([place]) 2009   L, Breast, radiation and surgery.  [name] COPD (chronic obstructive pulmonary disease) ([place])   . Depression   . GERD (gastroesophageal reflux disease)   . Hyperlipemia   . Hypertension   . Leriche syndrome ([place])    Infrarenal aortic occlusion extending to bilateral internal iliac arteries.  External iliacs fill via collaterals from lumbar arteries to hypogastric/internal iliac arteries -minimal disease on the right side and no disease on the left side leg arteries distally.  . Peripheral vascular disease ([place]) 2013    Patient Active Problem List   Diagnosis Date Noted  . Influenza vaccine refused 02/03/2020  . Resistant hypertension 01/20/2020  . Tubular adenoma of colon 04/27/2019  . Cervical radiculopathy 10/01/2018  . Hypertensive retinopathy 09/08/2018  . Seasonal allergies 06/29/2018  . Aortoiliac occlusive disease ([place]) 11/04/2017  . DOE (dyspnea on exertion) 11/03/2017  . Chronic chest wall pain 11/03/2017  . Ichthyosis 10/06/2017  . Former smoker 04/07/2017  . Primary osteoarthritis of right shoulder 04/07/2017   . Chronic midline low back pain without sciatica 04/07/2017  . Hiatal hernia 05/01/2016  . DJD (degenerative joint disease) of cervical spine 04/17/2016  . History of breast cancer 02/18/2016  . Foot callus 02/18/2016  . Anxiety and depression 05/22/2015  . Insomnia 03/15/2015  . Unspecified vitamin D deficiency 04/08/2013  . Hyperlipidemia with target LDL less than 70 04/08/2013  . Atherosclerotic PVD with intermittent claudication ([place]) 03/22/2013  . Accelerated hypertension 02/23/2013  . GERD (gastroesophageal reflux disease) 02/23/2013  . COPD (chronic obstructive pulmonary disease) ([place]) 02/23/2013    Past Surgical History:  Procedure Laterality Date  . APPENDECTOMY  1980  . BREAST LUMPECTOMY Left    s/p radiation therapy   . BREAST SURGERY    . COLONOSCOPY  07/2015  . COLONOSCOPY WITH PROPOFOL N/A 06/26/2015   Procedure: COLONOSCOPY WITH PROPOFOL;  Surgeon: [name], MD;  Location: [facility];  Service: Endoscopy;  Laterality: N/A;  . CT CTA CORONARY W/CA SCORE W/CM &/OR WO/CM  12/10/2017   Coronary calcium score is 0.  No evidence of CAD.  [name] PERIPHERAL VASCULAR CATHETERIZATION N/A 10/22/2015   Procedure: Abdominal Aortogram w/Lower Extremity;  Surgeon: [name], MD;  Location: [facility] INVASIVE CV LAB: Aortic arch widely patent as far as innominate artery, right subclavian artery, right vertebral artery and right, common carotid.  Bilateral single renal arteries - Nonocclusive disease. Infrarenal aorta 100% - bilateral common Iliac 100% --> B Ex Iliacs fill via Int Iliac. Leg OK  . TRANSTHORACIC ECHOCARDIOGRAM  08/2017  EF 60-65%. No RWMA.  Normal DF.  Normal valves.   . TUBAL LIGATION  1991    OB History   No obstetric history on file.      Home Medications    Prior to Admission medications   Medication Sig Start Date End Date Taking? Authorizing Provider  clindamycin (CLEOCIN) 300 MG capsule Take 1 capsule (300 mg total) by mouth 3 (three) times  daily. 05/11/20  Yes Scot Jun, FNP  albuterol (VENTOLIN HFA) 108 (90 Base) MCG/ACT inhaler Inhale 2 puffs into the lungs every 6 (six) hours as needed. for wheezing 04/09/20   [name], MD  amLODipine (NORVASC) 10 MG tablet Take 1 tablet (10 mg total) by mouth at bedtime. 04/09/20   [name], MD  aspirin EC 81 MG tablet Take 1 tablet (81 mg total) by mouth daily. 10/03/16   Funches, Adriana Mccallum, MD  atorvastatin (LIPITOR) 40 MG tablet Take 1 tablet (40 mg total) by mouth daily. 04/09/20   [name], MD  carvedilol (COREG) 6.25 MG tablet Take 1 tablet (6.25 mg total) by mouth 2 (two) times daily. 04/09/20   [name], MD  cilostazol (PLETAL) 50 MG tablet Take 1 tablet (50 mg total) by mouth 2 (two) times daily. 02/03/20   [name], MD  Fluticasone-Salmeterol (ADVAIR) 100-50 MCG/DOSE AEPB Inhale 1 puff into the lungs 2 (two) times daily. 04/09/20   [name], MD  gabapentin (NEURONTIN) 300 MG capsule Take 1 capsule (300 mg total) by mouth at bedtime. 04/09/20   [name], MD  hydrALAZINE (APRESOLINE) 25 MG tablet Take 1 tablet (25 mg total) by mouth 2 (two) times daily. May take an extra 25 mg tablet if blood pressure is above 150/90 after one hour recheck. 04/09/20 07/08/20  [name], MD  lisinopril (ZESTRIL) 40 MG tablet Take 1 tablet (40 mg total) by mouth daily. 04/09/20   [name], MD  meloxicam (MOBIC) 15 MG tablet Take 1 tablet (15 mg total) by mouth daily. 03/20/20   [name], MD  nystatin (MYCOSTATIN) 100000 UNIT/ML suspension Take 5 mLs (500,000 Units total) by mouth 4 (four) times daily. Swish and swallow 04/05/20   [name], MD  omeprazole (PRILOSEC) 40 MG capsule Take 1 capsule (40 mg total) by mouth daily. 04/09/20   [name], MD  spironolactone (ALDACTONE) 25 MG tablet Take 1 tablet (25 mg total) by mouth daily. 04/09/20   [name], MD    Family History Family History  Problem  Relation Age of Onset  . Hyperlipidemia Mother   . Hypertension Mother   . Heart disease Mother        Atrial Fib.  . Hyperlipidemia Sister   . Hypertension Sister   . Heart disease Sister        Before age 66,  CHF  . Heart attack Brother   . Hyperlipidemia Brother   . Hypertension Brother   . Heart disease Brother   . Hypertension Daughter     Social History Social History   Tobacco Use  . Smoking status: Former Smoker    Packs/day: 0.25    Years: 40.00    Pack years: 10.00    Types: Cigarettes    Quit date: 02/08/2017    Years since quitting: 3.2  . Smokeless tobacco: Never Used  Vaping Use  . Vaping Use: Never used  Substance Use Topics  . Alcohol use: No  . Drug  use: No     Allergies   Vicodin [hydrocodone-acetaminophen], Bactrim [sulfamethoxazole-trimethoprim], Incruse ellipta [umeclidinium bromide], and Wellbutrin [bupropion]   Review of Systems Review of Systems  Constitutional: Negative for fatigue and fever.  HENT: Negative for mouth sores.   Eyes: Negative for visual disturbance.  Respiratory: Negative for shortness of breath.   Cardiovascular: Negative for chest pain.  Gastrointestinal: Negative for abdominal pain, nausea and vomiting.  Genitourinary: Negative for genital sores.  Musculoskeletal: Negative for arthralgias and joint swelling.  Skin: Positive for color change and wound. Negative for rash.  Neurological: Negative for dizziness, weakness, light-headedness and headaches.     Physical Exam Triage Vital Signs ED Triage Vitals  Enc Vitals Group     BP      Pulse      Resp      Temp      Temp src      SpO2      Weight      Height      Head Circumference      Peak Flow      Pain Score      Pain Loc      Pain Edu?      Excl. in [place]?    No data found.  Updated Vital Signs BP (!) 161/80 (BP Location: Right Arm)   Pulse 64   Temp 98.4 F (36.9 C) (Oral)   Resp 12   SpO2 96%   Visual Acuity Right Eye Distance:   Left Eye  Distance:   Bilateral Distance:    Right Eye Near:   Left Eye Near:    Bilateral Near:     Physical Exam Vitals and nursing note reviewed.  Constitutional:      Appearance: She is well-developed.     Comments: No acute distress  HENT:     Head: Normocephalic and atraumatic.     Nose: Nose normal.  Eyes:     Conjunctiva/sclera: Conjunctivae normal.  Cardiovascular:     Rate and Rhythm: Normal rate.  Pulmonary:     Effort: Pulmonary effort is normal. No respiratory distress.  Abdominal:     General: There is no distension.  Musculoskeletal:        General: Normal range of motion.     Cervical back: Neck supple.  Skin:    General: Skin is warm and dry.     Comments: Left clavicle area with 1 cm area of induration, healing central wound, no fluctuance, no surrounding erythema  Neurological:     Mental Status: She is alert and oriented to person, place, and time.      UC Treatments / Results  Labs (all labs ordered are listed, but only abnormal results are displayed) Labs Reviewed - No data to display  EKG   Radiology No results found.  Procedures Procedures (including critical care time)  Medications Ordered in UC Medications - No data to display  Initial Impression / Assessment and Plan / UC Course  I have reviewed the triage vital signs and the nursing notes.  Pertinent labs & imaging results that were available during my care of the patient were reviewed by me and considered in my medical decision making (see chart for details).     Left chest wall abscess-improving, recommended to complete course of clindamycin, continue warm compresses, no further I&D warranted at this time.  Continue to monitor.  Discussed strict return precautions. Patient verbalized understanding and is agreeable with plan.  Final Clinical Impressions(s) /  UC Diagnoses   Final diagnoses:  Chest wall abscess  Follow up     Discharge Instructions     Complete course of  clindamycin Continue warm compresses Follow-up if symptoms returning or worsening    ED Prescriptions    None     PDMP not reviewed this encounter.   Janith Lima, [place] 05/15/20 [phone]

## 2020-05-15 NOTE — ED Triage Notes (Signed)
Patient presents for follow-up of abscess on chest.   Patient endorses improvement of symptoms. Patient denies any abnormal drainage.   Patient states she was told to come back in to follow up on abscess after I & D was performed on 05/11/2020 by Kenton Kingfisher NP.  Patient has taken prescribed antibiotics.

## 2020-05-16 ENCOUNTER — Ambulatory Visit (HOSPITAL_COMMUNITY)
Admission: RE | Admit: 2020-05-16 | Discharge: 2020-05-16 | Disposition: A | Discharge status: Home / Self Care | Payer: Medicare Other | Source: Ambulatory Visit | Attending: Vascular Surgery | Admitting: Vascular Surgery

## 2020-05-16 ENCOUNTER — Ambulatory Visit (INDEPENDENT_AMBULATORY_CARE_PROVIDER_SITE_OTHER): Payer: Medicare Other | Admitting: Vascular Surgery

## 2020-05-16 ENCOUNTER — Encounter: Payer: Self-pay | Admitting: Vascular Surgery

## 2020-05-16 VITALS — BP 143/79 | HR 64 | Temp 97.6°F | Resp 20 | Ht 65.0 in | Wt 188.9 lb

## 2020-05-16 DIAGNOSIS — I739 Peripheral vascular disease, unspecified: Secondary | ICD-10-CM | POA: Diagnosis not present

## 2020-05-16 DIAGNOSIS — I7409 Other arterial embolism and thrombosis of abdominal aorta: Secondary | ICD-10-CM

## 2020-05-16 NOTE — Progress Notes (Signed)
REASON FOR VISIT:   Follow-up of peripheral vascular disease  MEDICAL ISSUES:    PERIPHERAL VASCULAR DISEASE: This patient has stable claudication of both lower extremities.  She has no rest pain or nonhealing ulcers.  She is very motivated to exercise but because of some pimples in her neighborhood she is very reluctant to exercise outside.  She is trying to exercise in her home.  She has no insurance that may help her obtain membership to a gym and is motivated to start a walking program.  We had previously discussed vascular rehab at [facility] but this has never materialized.  Today we also discussed the importance of nutrition.  I favor largely a plant-based diet.  Given that her ABIs are stable and her symptoms are stable I think we can stretch her follow-up out to 18 months.  I have ordered follow-up ABIs in 18 months and I will see her back at that time.  She knows to call sooner if she has problems.  She is on aspirin and is on a statin.  She is not a smoker.  We would only consider revascularization if she developed disabling claudication, rest pain, or nonhealing ulcers.   HPI:   Brandy Stevenson is a pleasant 65 y.o. female who I last saw on 10/05/2019.  She has a known aortic occlusion.  She had stable bilateral calf claudication.  Since I saw her last, her symptoms are fairly stable.  She describes claudication in her thighs and calves bilaterally.  Her symptoms are brought on by ambulation and relieved with rest.  Her symptoms are more significant on the left side.  Her activity is somewhat limited and that there are a couple of pit bulls in her neighborhood and she is afraid to walk outside.  She does walk to her mailbox and back.  She also tries to exercise in her house as much as possible.  She denies any history of rest pain or history of nonhealing ulcers.  She quit smoking in 2018.  Her risk factors for peripheral vascular disease include hypertension, hyperlipidemia, and a  remote history of tobacco use.  She denies any family history of premature cardiovascular disease.  She denies diabetes.  Past Medical History:  Diagnosis Date  . Arthritis    back problems to be evaluated by neurology next month  . Cancer ([place]) 2009   L, Breast, radiation and surgery.  [name] COPD (chronic obstructive pulmonary disease) ([place])   . Depression   . GERD (gastroesophageal reflux disease)   . Hyperlipemia   . Hypertension   . Leriche syndrome ([place])    Infrarenal aortic occlusion extending to bilateral internal iliac arteries.  External iliacs fill via collaterals from lumbar arteries to hypogastric/internal iliac arteries -minimal disease on the right side and no disease on the left side leg arteries distally.  . Peripheral vascular disease ([place]) 2013    Family History  Problem Relation Age of Onset  . Hyperlipidemia Mother   . Hypertension Mother   . Heart disease Mother        Atrial Fib.  . Hyperlipidemia Sister   . Hypertension Sister   . Heart disease Sister        Before age 74,  CHF  . Heart attack Brother   . Hyperlipidemia Brother   . Hypertension Brother   . Heart disease Brother   . Hypertension Daughter     SOCIAL HISTORY: Social History   Tobacco Use  . Smoking status:  Former Smoker    Packs/day: 0.25    Years: 40.00    Pack years: 10.00    Types: Cigarettes    Quit date: 02/08/2017    Years since quitting: 3.2  . Smokeless tobacco: Never Used  Substance Use Topics  . Alcohol use: No    Allergies  Allergen Reactions  . Vicodin [Hydrocodone-Acetaminophen] Nausea And Vomiting and Other (See Comments)    Sweating  . Bactrim [Sulfamethoxazole-Trimethoprim]     HA and vomiting  . Incruse Ellipta [Umeclidinium Bromide]     Dry mouth  . Wellbutrin [Bupropion]     Made me feel bad    Current Outpatient Medications  Medication Sig Dispense Refill  . albuterol (VENTOLIN HFA) 108 (90 Base) MCG/ACT inhaler Inhale 2 puffs into the lungs every 6  (six) hours as needed. for wheezing 54 g 6  . amLODipine (NORVASC) 10 MG tablet Take 1 tablet (10 mg total) by mouth at bedtime. 90 tablet 0  . aspirin EC 81 MG tablet Take 1 tablet (81 mg total) by mouth daily. 90 tablet 3  . atorvastatin (LIPITOR) 40 MG tablet Take 1 tablet (40 mg total) by mouth daily. 90 tablet 0  . carvedilol (COREG) 6.25 MG tablet Take 1 tablet (6.25 mg total) by mouth 2 (two) times daily. 180 tablet 0  . cilostazol (PLETAL) 50 MG tablet Take 1 tablet (50 mg total) by mouth 2 (two) times daily. 60 tablet 3  . clindamycin (CLEOCIN) 300 MG capsule Take 1 capsule (300 mg total) by mouth 3 (three) times daily. 21 capsule 0  . Fluticasone-Salmeterol (ADVAIR) 100-50 MCG/DOSE AEPB Inhale 1 puff into the lungs 2 (two) times daily. 180 each 0  . gabapentin (NEURONTIN) 300 MG capsule Take 1 capsule (300 mg total) by mouth at bedtime. 90 capsule 0  . hydrALAZINE (APRESOLINE) 25 MG tablet Take 1 tablet (25 mg total) by mouth 2 (two) times daily. May take an extra 25 mg tablet if blood pressure is above 150/90 after one hour recheck. 270 tablet 3  . lisinopril (ZESTRIL) 40 MG tablet Take 1 tablet (40 mg total) by mouth daily. 90 tablet 0  . meloxicam (MOBIC) 15 MG tablet Take 1 tablet (15 mg total) by mouth daily. 90 tablet 0  . nystatin (MYCOSTATIN) 100000 UNIT/ML suspension Take 5 mLs (500,000 Units total) by mouth 4 (four) times daily. Swish and swallow 100 mL 0  . omeprazole (PRILOSEC) 40 MG capsule Take 1 capsule (40 mg total) by mouth daily. 90 capsule 0  . spironolactone (ALDACTONE) 25 MG tablet Take 1 tablet (25 mg total) by mouth daily. 60 tablet 0   No current facility-administered medications for this visit.    REVIEW OF SYSTEMS:  [X]  denotes positive finding, [ ]  denotes negative finding Cardiac  Comments:  Chest pain or chest pressure:    Shortness of breath upon exertion:    Short of breath when lying flat:    Irregular heart rhythm:        Vascular    Pain in  calf, thigh, or hip brought on by ambulation: x   Pain in feet at night that wakes you up from your sleep:     Blood clot in your veins:    Leg swelling:         Pulmonary    Oxygen at home:    Productive cough:     Wheezing:         Neurologic    Sudden weakness in arms  or legs:     Sudden numbness in arms or legs:     Sudden onset of difficulty speaking or slurred speech:    Temporary loss of vision in one eye:     Problems with dizziness:         Gastrointestinal    Blood in stool:     Vomited blood:         Genitourinary    Burning when urinating:     Blood in urine:        Psychiatric    Major depression:         Hematologic    Bleeding problems:    Problems with blood clotting too easily:        Skin    Rashes or ulcers:        Constitutional    Fever or chills:     PHYSICAL EXAM:   Vitals:   05/16/20 1330  BP: (!) 143/79  Pulse: 64  Resp: 20  Temp: 97.6 F (36.4 C)  SpO2: 95%  Weight: 85.7 kg  Height: 5\' 5"  (1.651 m)    GENERAL: The patient is a well-nourished female, in no acute distress. The vital signs are documented above. CARDIAC: There is a regular rate and rhythm.  VASCULAR: I do not detect carotid bruits. I cannot palpate femoral pulses. I cannot palpate pedal pulses. She has no significant lower extremity swelling. PULMONARY: There is good air exchange bilaterally without wheezing or rales. ABDOMEN: Soft and non-tender with normal pitched bowel sounds.  MUSCULOSKELETAL: There are no major deformities or cyanosis. NEUROLOGIC: No focal weakness or paresthesias are detected. SKIN: There are no ulcers or rashes noted. PSYCHIATRIC: The patient has a normal affect.  DATA:    ARTERIAL DOPPLER STUDY: I have independently interpreted her arterial Doppler study.  On the right side there is a monophasic dorsalis pedis and posterior tibial signal.  ABI is 59%.  Toe pressures 54 mmHg.  On the left side, there is a monophasic dorsalis pedis and  posterior tibial signal.  ABI is 66%.  Toe pressure 65 mmHg.  LABS: I reviewed her most recent labs.  Her GFR was 93.  Creatinine 0.79.  Deitra Mayo [facility] [phone]

## 2020-05-23 ENCOUNTER — Other Ambulatory Visit: Payer: Self-pay | Admitting: Pharmacist

## 2020-05-23 ENCOUNTER — Other Ambulatory Visit: Payer: Self-pay | Admitting: Pharmacy Technician

## 2020-05-23 DIAGNOSIS — J449 Chronic obstructive pulmonary disease, unspecified: Secondary | ICD-10-CM

## 2020-05-23 DIAGNOSIS — I70219 Atherosclerosis of native arteries of extremities with intermittent claudication, unspecified extremity: Secondary | ICD-10-CM

## 2020-05-23 DIAGNOSIS — Z760 Encounter for issue of repeat prescription: Secondary | ICD-10-CM

## 2020-05-23 DIAGNOSIS — I10 Essential (primary) hypertension: Secondary | ICD-10-CM

## 2020-05-23 MED ORDER — FLUTICASONE-SALMETEROL 100-50 MCG/DOSE IN AEPB: 1.0000 | INHALATION_SPRAY | Freq: Two times a day (BID) | RESPIRATORY_TRACT | 0 refills | Status: DC

## 2020-05-23 MED ORDER — AMLODIPINE BESYLATE 10 MG PO TABS: 10.0000 mg | ORAL_TABLET | Freq: Every day | ORAL | 0 refills | Status: DC

## 2020-05-23 MED ORDER — ATORVASTATIN CALCIUM 40 MG PO TABS: 40.0000 mg | ORAL_TABLET | Freq: Every day | ORAL | 0 refills | Status: DC

## 2020-05-23 MED ORDER — OMEPRAZOLE 40 MG PO CPDR: 40.0000 mg | DELAYED_RELEASE_CAPSULE | Freq: Every day | ORAL | 0 refills | Status: DC

## 2020-05-23 MED ORDER — CARVEDILOL 6.25 MG PO TABS: 6.2500 mg | ORAL_TABLET | Freq: Two times a day (BID) | ORAL | 0 refills | Status: DC

## 2020-05-23 MED ORDER — SPIRONOLACTONE 25 MG PO TABS: 25.0000 mg | ORAL_TABLET | Freq: Every day | ORAL | 0 refills | Status: DC

## 2020-05-23 MED ORDER — ALBUTEROL SULFATE HFA 108 (90 BASE) MCG/ACT IN AERS: 2.0000 | INHALATION_SPRAY | Freq: Four times a day (QID) | RESPIRATORY_TRACT | 0 refills | Status: DC | PRN

## 2020-05-23 MED ORDER — HYDRALAZINE HCL 25 MG PO TABS: 25.0000 mg | ORAL_TABLET | Freq: Two times a day (BID) | ORAL | 0 refills | Status: DC

## 2020-05-23 MED ORDER — LISINOPRIL 40 MG PO TABS: 40.0000 mg | ORAL_TABLET | Freq: Every day | ORAL | 0 refills | Status: DC

## 2020-05-23 MED ORDER — GABAPENTIN 300 MG PO CAPS: 300.0000 mg | ORAL_CAPSULE | Freq: Every day | ORAL | 0 refills | Status: DC

## 2020-05-23 MED FILL — ATORVASTATIN CALCIUM 40 MG: 40 | 30 days supply | Qty: 30 | Fill #0

## 2020-05-23 MED FILL — AMLODIPINE BESYLATE 10 MG T: 10 | 30 days supply | Qty: 30 | Fill #0

## 2020-05-23 MED FILL — CARVEDILOL 6.25 MG TABLET: 6.25 | 30 days supply | Qty: 60 | Fill #0

## 2020-05-23 MED FILL — hydrALAZINE HCL 25 MG TABS: 25 | 20 days supply | Qty: 60 | Fill #0

## 2020-05-23 MED FILL — PROVENTIL HFA 108 (90 BASE): 108 (90 BAS | 30 days supply | Qty: 7 | Fill #0

## 2020-06-06 ENCOUNTER — Other Ambulatory Visit: Payer: Self-pay

## 2020-06-06 MED FILL — Spironolactone Tab 25 MG: ORAL | 60 days supply | Qty: 60 | Fill #0 | Status: AC

## 2020-06-06 MED FILL — Gabapentin Cap 300 MG: ORAL | 30 days supply | Qty: 30 | Fill #0 | Status: AC

## 2020-06-07 ENCOUNTER — Other Ambulatory Visit: Payer: Self-pay

## 2020-06-14 ENCOUNTER — Encounter: Payer: Self-pay | Admitting: Internal Medicine

## 2020-06-14 ENCOUNTER — Other Ambulatory Visit: Payer: Self-pay

## 2020-06-14 ENCOUNTER — Ambulatory Visit: Payer: Medicare Other | Attending: Internal Medicine | Admitting: Internal Medicine

## 2020-06-14 VITALS — BP 148/84 | HR 66 | Resp 16 | Wt 187.8 lb

## 2020-06-14 DIAGNOSIS — I739 Peripheral vascular disease, unspecified: Secondary | ICD-10-CM | POA: Diagnosis not present

## 2020-06-14 DIAGNOSIS — J449 Chronic obstructive pulmonary disease, unspecified: Secondary | ICD-10-CM

## 2020-06-14 DIAGNOSIS — Z1231 Encounter for screening mammogram for malignant neoplasm of breast: Secondary | ICD-10-CM | POA: Diagnosis not present

## 2020-06-14 DIAGNOSIS — E669 Obesity, unspecified: Secondary | ICD-10-CM

## 2020-06-14 DIAGNOSIS — Z28311 Partially vaccinated for covid-19: Secondary | ICD-10-CM

## 2020-06-14 DIAGNOSIS — I10 Essential (primary) hypertension: Secondary | ICD-10-CM | POA: Diagnosis not present

## 2020-06-14 DIAGNOSIS — Z289 Immunization not carried out for unspecified reason: Secondary | ICD-10-CM

## 2020-06-14 MED ORDER — CILOSTAZOL 50 MG PO TABS
ORAL_TABLET | Freq: Two times a day (BID) | ORAL | 6 refills | Status: DC
  Filled 2020-06-14: qty 60, 30d supply, fill #0
  Filled 2020-08-23: qty 60, 30d supply, fill #1

## 2020-06-14 MED ORDER — LISINOPRIL 40 MG PO TABS
ORAL_TABLET | Freq: Every day | ORAL | 1 refills | Status: DC
  Filled 2020-06-14: qty 90, 90d supply, fill #0
  Filled 2020-09-14: qty 90, 90d supply, fill #1

## 2020-06-14 NOTE — Patient Instructions (Signed)
I encourage you to check your blood pressure at least once a week about 3 hours after taking your morning medications.  Goal is 130/80 or lower.  Please consider using your gym membership to start an exercise program.  Start low and go slow.  If you can only start with twice a week for 20 minutes that would be good.  Please consider getting the COVID-19 vaccine series as discussed today to help protect yourself, your family and your community.   Healthy Eating Following a healthy eating pattern may help you to achieve and maintain a healthy body weight, reduce the risk of chronic disease, and live a long and productive life. It is important to follow a healthy eating pattern at an appropriate calorie level for your body. Your nutritional needs should be met primarily through food by choosing a variety of nutrient-rich foods. What are tips for following this plan? Reading food labels  Read labels and choose the following: ? Reduced or low sodium. ? Juices with 100% fruit juice. ? Foods with low saturated fats and high polyunsaturated and monounsaturated fats. ? Foods with whole grains, such as whole wheat, cracked wheat, brown rice, and wild rice. ? Whole grains that are fortified with folic acid. This is recommended for women who are pregnant or who want to become pregnant.  Read labels and avoid the following: ? Foods with a lot of added sugars. These include foods that contain brown sugar, corn sweetener, corn syrup, dextrose, fructose, glucose, high-fructose corn syrup, honey, invert sugar, lactose, malt syrup, maltose, molasses, raw sugar, sucrose, trehalose, or turbinado sugar.  Do not eat more than the following amounts of added sugar per day:  6 teaspoons (25 g) for women.  9 teaspoons (38 g) for men. ? Foods that contain processed or refined starches and grains. ? Refined grain products, such as white flour, degermed cornmeal, white bread, and white rice. Shopping  Choose  nutrient-rich snacks, such as vegetables, whole fruits, and nuts. Avoid high-calorie and high-sugar snacks, such as potato chips, fruit snacks, and candy.  Use oil-based dressings and spreads on foods instead of solid fats such as butter, stick margarine, or cream cheese.  Limit pre-made sauces, mixes, and "instant" products such as flavored rice, instant noodles, and ready-made pasta.  Try more plant-protein sources, such as tofu, tempeh, black beans, edamame, lentils, nuts, and seeds.  Explore eating plans such as the Mediterranean diet or vegetarian diet. Cooking  Use oil to saut or stir-fry foods instead of solid fats such as butter, stick margarine, or lard.  Try baking, boiling, grilling, or broiling instead of frying.  Remove the fatty part of meats before cooking.  Steam vegetables in water or broth. Meal planning  At meals, imagine dividing your plate into fourths: ? One-half of your plate is fruits and vegetables. ? One-fourth of your plate is whole grains. ? One-fourth of your plate is protein, especially lean meats, poultry, eggs, tofu, beans, or nuts.  Include low-fat dairy as part of your daily diet.   Lifestyle  Choose healthy options in all settings, including home, work, school, restaurants, or stores.  Prepare your food safely: ? Wash your hands after handling raw meats. ? Keep food preparation surfaces clean by regularly washing with hot, soapy water. ? Keep raw meats separate from ready-to-eat foods, such as fruits and vegetables. ? Cook seafood, meat, poultry, and eggs to the recommended internal temperature. ? Store foods at safe temperatures. In general:  Keep cold foods at 6F (  4.4C) or below.  Keep hot foods at 140F (60C) or above.  Keep your freezer at [facility] (-17.8C) or below.  Foods are no longer safe to eat when they have been between the temperatures of 40-140F (4.4-60C) for more than 2 hours. What foods should I eat? Fruits Aim to eat  2 cup-equivalents of fresh, canned (in natural juice), or frozen fruits each day. Examples of 1 cup-equivalent of fruit include 1 small apple, 8 large strawberries, 1 cup canned fruit,  cup dried fruit, or 1 cup 100% juice. Vegetables Aim to eat 2-3 cup-equivalents of fresh and frozen vegetables each day, including different varieties and colors. Examples of 1 cup-equivalent of vegetables include 2 medium carrots, 2 cups raw, leafy greens, 1 cup chopped vegetable (raw or cooked), or 1 medium baked potato. Grains Aim to eat 6 ounce-equivalents of whole grains each day. Examples of 1 ounce-equivalent of grains include 1 slice of bread, 1 cup ready-to-eat cereal, 3 cups popcorn, or  cup cooked rice, pasta, or cereal. Meats and other proteins Aim to eat 5-6 ounce-equivalents of protein each day. Examples of 1 ounce-equivalent of protein include 1 egg, 1/2 cup nuts or seeds, or 1 tablespoon (16 g) peanut butter. A cut of meat or fish that is the size of a deck of cards is about 3-4 ounce-equivalents.  Of the protein you eat each week, try to have at least 8 ounces come from seafood. This includes salmon, trout, herring, and anchovies. Dairy Aim to eat 3 cup-equivalents of fat-free or low-fat dairy each day. Examples of 1 cup-equivalent of dairy include 1 cup (240 mL) milk, 8 ounces (250 g) yogurt, 1 ounces (44 g) natural cheese, or 1 cup (240 mL) fortified soy milk. Fats and oils  Aim for about 5 teaspoons (21 g) per day. Choose monounsaturated fats, such as canola and olive oils, avocados, peanut butter, and most nuts, or polyunsaturated fats, such as sunflower, corn, and soybean oils, walnuts, pine nuts, sesame seeds, sunflower seeds, and flaxseed. Beverages  Aim for six 8-oz glasses of water per day. Limit coffee to three to five 8-oz cups per day.  Limit caffeinated beverages that have added calories, such as soda and energy drinks.  Limit alcohol intake to no more than 1 drink a day for  nonpregnant women and 2 drinks a day for men. One drink equals 12 oz of beer (355 mL), 5 oz of wine (148 mL), or 1 oz of hard liquor (44 mL). Seasoning and other foods  Avoid adding excess amounts of salt to your foods. Try flavoring foods with herbs and spices instead of salt.  Avoid adding sugar to foods.  Try using oil-based dressings, sauces, and spreads instead of solid fats. This information is based on general [place] nutrition guidelines. For more information, visit [employer]. Exact amounts may vary based on your nutrition needs. Summary  A healthy eating plan may help you to maintain a healthy weight, reduce the risk of chronic diseases, and stay active throughout your life.  Plan your meals. Make sure you eat the right portions of a variety of nutrient-rich foods.  Try baking, boiling, grilling, or broiling instead of frying.  Choose healthy options in all settings, including home, work, school, restaurants, or stores. This information is not intended to replace advice given to you by your health care provider. Make sure you discuss any questions you have with your health care provider. Document Revised: 06/01/2017 Document Reviewed: 06/01/2017 Elsevier Patient Education  [place].

## 2020-06-14 NOTE — Progress Notes (Signed)
Patient ID: Brandy Stevenson, female    DOB: [birth year]  MRN: [id number]  CC: Hypertension   Subjective: Brandy Stevenson is a 65 y.o. female who presents for chronic ds management Her concerns today include:  Hx of PAD, HTN, HL,formertob dep, LT breast CA (lumpectomy and XRT 2009), GERD, COPD, Trigeminal neuralgia RT, dep/anxiety, OA shoulders  PAD: saw Dr. Bobette Mo recently.  ABI abnormal but stable.  She has not had any ulcers on the legs or rest pain.  She is taking aspirin, statin and Pletal. Pletal helpful  HTN: Taking all 5 meds and not using salt in her cooking Took meds just a few minutes ago Not checking BP unless she feels BP high Denies any chest pain, no lower extremity edema.  Obesity:  wgh down 4 lbs since last visit in Dec 2021.  Feels she needs to walk more but afraid to do so in her neighborhood due to dogs.  Has a gym membership offer through her insurance.  Plans to use it and get her spouse to go with her. -eating less sweets  COPD:  Taking and doing well with Albuterol and Advair.  Using Albuterol PRN but not regularly However, she gets recurrent thrush when she wears dentures and uses the Advair.  Has to use nystatin topical solution intermittently. [name] HM: still has not taken COVID vaccine.  "I don't know why."  Due for MMG   Patient Active Problem List   Diagnosis Date Noted  . Influenza vaccine refused 02/03/2020  . Resistant hypertension 01/20/2020  . Tubular adenoma of colon 04/27/2019  . Cervical radiculopathy 10/01/2018  . Hypertensive retinopathy 09/08/2018  . Seasonal allergies 06/29/2018  . Aortoiliac occlusive disease ([place]) 11/04/2017  . DOE (dyspnea on exertion) 11/03/2017  . Chronic chest wall pain 11/03/2017  . Ichthyosis 10/06/2017  . Former smoker 04/07/2017  . Primary osteoarthritis of right shoulder 04/07/2017  . Chronic midline low back pain without sciatica 04/07/2017  . Hiatal hernia 05/01/2016  . DJD (degenerative  joint disease) of cervical spine 04/17/2016  . History of breast cancer 02/18/2016  . Foot callus 02/18/2016  . Anxiety and depression 05/22/2015  . Insomnia 03/15/2015  . Unspecified vitamin D deficiency 04/08/2013  . Hyperlipidemia with target LDL less than 70 04/08/2013  . Atherosclerotic PVD with intermittent claudication ([place]) 03/22/2013  . Accelerated hypertension 02/23/2013  . GERD (gastroesophageal reflux disease) 02/23/2013  . COPD (chronic obstructive pulmonary disease) ([place]) 02/23/2013     Current Outpatient Medications on File Prior to Visit  Medication Sig Dispense Refill  . albuterol (VENTOLIN HFA) 108 (90 Base) MCG/ACT inhaler INHALE 2 PUFFS INTO THE LUNGS EVERY 6 (SIX) HOURS AS NEEDED FOR WHEEZING OR SHORTNESS OF BREATH. 6.7 g 0  . amLODipine (NORVASC) 10 MG tablet TAKE 1 TABLET (10 MG TOTAL) BY MOUTH AT BEDTIME. 30 tablet 0  . aspirin EC 81 MG tablet Take 1 tablet (81 mg total) by mouth daily. 90 tablet 3  . atorvastatin (LIPITOR) 40 MG tablet TAKE 1 TABLET (40 MG TOTAL) BY MOUTH DAILY. 30 tablet 0  . carvedilol (COREG) 6.25 MG tablet TAKE 1 TABLET (6.25 MG TOTAL) BY MOUTH 2 (TWO) TIMES DAILY. 60 tablet 0  . Fluticasone-Salmeterol (ADVAIR) 100-50 MCG/DOSE AEPB INHALE 1 PUFF INTO THE LUNGS 2 (TWO) TIMES DAILY. 60 each 0  . gabapentin (NEURONTIN) 300 MG capsule TAKE 1 CAPSULE (300 MG TOTAL) BY MOUTH AT BEDTIME. 30 capsule 0  . hydrALAZINE (APRESOLINE) 25 MG tablet TAKE 1 TABLET (25 MG  TOTAL) BY MOUTH 2 (TWO) TIMES DAILY. MAY TAKE AN TABLET IF BLOOD PRESSURE IS ABOVE 150/90 AFTER ONE HOUR RECHECK. 60 tablet 0  . meloxicam (MOBIC) 15 MG tablet TAKE 1 TABLET (15 MG TOTAL) BY MOUTH DAILY. 90 tablet 0  . nystatin (MYCOSTATIN) 100000 UNIT/ML suspension TAKE 5 MLS (500,000 UNITS TOTAL) BY MOUTH 4 (FOUR) TIMES DAILY. SWISH AND SWALLOW 100 mL 0  . omeprazole (PRILOSEC) 40 MG capsule TAKE 1 CAPSULE (40 MG TOTAL) BY MOUTH DAILY. 30 capsule 0  . spironolactone (ALDACTONE) 25 MG tablet  TAKE 1 TABLET (25 MG TOTAL) BY MOUTH DAILY. 60 tablet 0   No current facility-administered medications on file prior to visit.    Allergies  Allergen Reactions  . Vicodin [Hydrocodone-Acetaminophen] Nausea And Vomiting and Other (See Comments)    Sweating  . Bactrim [Sulfamethoxazole-Trimethoprim]     HA and vomiting  . Incruse Ellipta [Umeclidinium Bromide]     Dry mouth  . Wellbutrin [Bupropion]     Made me feel bad    Social History   Socioeconomic History  . Marital status: Married    Spouse name: Not on file  . Number of children: Not on file  . Years of education: Not on file  . Highest education level: Not on file  Occupational History  . Not on file  Tobacco Use  . Smoking status: Former Smoker    Packs/day: 0.25    Years: 40.00    Pack years: 10.00    Types: Cigarettes    Quit date: 02/08/2017    Years since quitting: 3.3  . Smokeless tobacco: Never Used  Vaping Use  . Vaping Use: Never used  Substance and Sexual Activity  . Alcohol use: No  . Drug use: No  . Sexual activity: Yes  Other Topics Concern  . Not on file  Social History Narrative  . Not on file   Social Determinants of Health   Financial Resource Strain: Not on file  Food Insecurity: Not on file  Transportation Needs: Not on file  Physical Activity: Not on file  Stress: Not on file  Social Connections: Not on file  Intimate Partner Violence: Not on file    Family History  Problem Relation Age of Onset  . Hyperlipidemia Mother   . Hypertension Mother   . Heart disease Mother        Atrial Fib.  . Hyperlipidemia Sister   . Hypertension Sister   . Heart disease Sister        Before age 36,  CHF  . Heart attack Brother   . Hyperlipidemia Brother   . Hypertension Brother   . Heart disease Brother   . Hypertension Daughter     Past Surgical History:  Procedure Laterality Date  . APPENDECTOMY  1980  . BREAST LUMPECTOMY Left    s/p radiation therapy   . BREAST SURGERY    .  COLONOSCOPY  07/2015  . COLONOSCOPY WITH PROPOFOL N/A 06/26/2015   Procedure: COLONOSCOPY WITH PROPOFOL;  Surgeon: [name], MD;  Location: [facility];  Service: Endoscopy;  Laterality: N/A;  . CT CTA CORONARY W/CA SCORE W/CM &/OR WO/CM  12/10/2017   Coronary calcium score is 0.  No evidence of CAD.  [name] PERIPHERAL VASCULAR CATHETERIZATION N/A 10/22/2015   Procedure: Abdominal Aortogram w/Lower Extremity;  Surgeon: [name], MD;  Location: [facility] INVASIVE CV LAB: Aortic arch widely patent as far as innominate artery, right subclavian artery, right vertebral artery and right, common  carotid.  Bilateral single renal arteries - Nonocclusive disease. Infrarenal aorta 100% - bilateral common Iliac 100% --> B Ex Iliacs fill via Int Iliac. Leg OK  . TRANSTHORACIC ECHOCARDIOGRAM  08/2017   EF 60-65%. No RWMA.  Normal DF.  Normal valves.   . TUBAL LIGATION  1991    ROS: Review of Systems Negative except as stated above  PHYSICAL EXAM: BP (!) 148/84   Pulse 66   Resp 16   Wt 187 lb 12.8 oz (85.2 kg)   SpO2 96%   BMI 31.25 kg/m   Wt Readings from Last 3 Encounters:  06/14/20 187 lb 12.8 oz (85.2 kg)  05/16/20 188 lb 14.4 oz (85.7 kg)  02/14/20 191 lb 9.6 oz (86.9 kg)    Physical Exam  General appearance - alert, well appearing, and in no distress Mental status - normal mood, behavior, speech, dress, motor activity, and thought processes Mouth -and is edentulous.  She does not have her dentures on today.  Throat is clear. Neck - supple, no significant adenopathy Chest -breath sounds mildly decreased bilaterally.  No wheezes or crackles at this time. Heart - normal rate, regular rhythm, normal S1, S2, no murmurs, rubs, clicks or gallops Extremities -no lower extremity edema  CMP Latest Ref Rng & Units 07/08/2019 04/28/2019 07/06/2018  Glucose 65 - 99 mg/dL - 87 -  BUN 8 - 27 mg/dL - 4(L) -  Creatinine 0.57 - 1.00 mg/dL - 0.79 -  Sodium 134 - 144 mmol/L - 145(H) -  Potassium  3.5 - 5.2 mmol/L 3.7 3.6 3.5  Chloride 96 - 106 mmol/L - 104 -  CO2 20 - 29 mmol/L - 27 -  Calcium 8.7 - 10.3 mg/dL - 9.2 -  Total Protein 6.0 - 8.5 g/dL - 7.5 -  Total Bilirubin 0.0 - 1.2 mg/dL - 0.3 -  Alkaline Phos 39 - 117 IU/L - 95 -  AST 0 - 40 IU/L - 37 -  ALT 0 - 32 IU/L - 45(H) -   Lipid Panel     Component Value Date/Time   CHOL 146 04/28/2019 1222   TRIG 69 04/28/2019 1222   HDL 70 04/28/2019 1222   CHOLHDL 2.1 04/28/2019 1222   CHOLHDL 2.8 07/27/2014 1207   VLDL 32 07/27/2014 1207   LDLCALC 62 04/28/2019 1222    CBC    Component Value Date/Time   WBC 4.8 04/28/2019 1222   WBC 6.6 08/25/2017 0118   RBC 3.86 04/28/2019 1222   RBC 4.22 08/25/2017 0118   HGB 12.0 04/28/2019 1222   HGB 13.8 06/12/2008 0939   HCT 35.6 04/28/2019 1222   HCT 40.2 06/12/2008 0939   PLT 246 04/28/2019 1222   MCV 92 04/28/2019 1222   MCV 90.1 06/12/2008 0939   MCH 31.1 04/28/2019 1222   MCH 30.3 08/25/2017 0118   MCHC 33.7 04/28/2019 1222   MCHC 32.4 08/25/2017 0118   RDW 13.2 04/28/2019 1222   RDW 13.9 06/12/2008 0939   LYMPHSABS 0.4 (L) 03/10/2017 1335   LYMPHSABS 1.5 06/12/2008 0939   MONOABS 0.5 03/23/2016 2308   MONOABS 0.6 06/12/2008 0939   EOSABS 0.1 03/10/2017 1335   BASOSABS 0.0 03/10/2017 1335   BASOSABS 0.0 06/12/2008 0939    ASSESSMENT AND PLAN: 1. Essential hypertension Not at goal but she had just taken her morning medicines a few minutes prior to me coming in the room.  Encourage her to check her blood pressure at least once a week with goal being 130/80  or lower.  Continue lisinopril, hydralazine, carvedilol, amlodipine and spironolactone - CBC - Comprehensive metabolic panel - Lipid panel - lisinopril (ZESTRIL) 40 MG tablet; TAKE 1 TABLET (40 MG TOTAL) BY MOUTH DAILY.  Dispense: 90 tablet; Refill: 1  2. Obesity (BMI 30.0-34.9) Continue to encourage her to practice healthy eating habits.  Printed information given. Encourage her to use the free gym  membership offered through her insurance.  She plans to start going with her husband.  Advised to start low and go slow.  3. Chronic obstructive pulmonary disease, unspecified COPD type ([place]) Stable on current Advair inhaler.  She will continue to wash her mouth out with warm water after each use to help prevent oral thrush  4. PAD (peripheral artery disease) ([facility]) Stable.  Continue statin, aspirin and Pletal - cilostazol (PLETAL) 50 MG tablet; TAKE 1 TABLET (50 MG TOTAL) BY MOUTH 2 (TWO) TIMES DAILY.  Dispense: 60 tablet; Refill: 6  5. Encounter for screening mammogram for malignant neoplasm of breast - MM Digital Screening; Future  6. COVID-19 vaccine series not completed Strongly advised her to get the COVID-19 vaccine to help protect herself, her family in the community.   Patient was given the opportunity to ask questions.  Patient verbalized understanding of the plan and was able to repeat key elements of the plan.   Orders Placed This Encounter  Procedures  . MM Digital Screening  . CBC  . Comprehensive metabolic panel  . Lipid panel     Requested Prescriptions   Signed Prescriptions Disp Refills  . cilostazol (PLETAL) 50 MG tablet 60 tablet 6    Sig: TAKE 1 TABLET (50 MG TOTAL) BY MOUTH 2 (TWO) TIMES DAILY.  [name] lisinopril (ZESTRIL) 40 MG tablet 90 tablet 1    Sig: TAKE 1 TABLET (40 MG TOTAL) BY MOUTH DAILY.    Return in about 3 months (around 09/13/2020) for Medicare Wellness Visit.  Karle Plumber, MD, FACP

## 2020-06-15 ENCOUNTER — Telehealth: Payer: Self-pay | Admitting: Internal Medicine

## 2020-06-15 DIAGNOSIS — E875 Hyperkalemia: Secondary | ICD-10-CM

## 2020-06-15 LAB — CBC
Hematocrit: 38.9 % (ref 34.0–46.6)
Hemoglobin: 12.7 g/dL (ref 11.1–15.9)
MCH: 31 pg (ref 26.6–33.0)
MCHC: 32.6 g/dL (ref 31.5–35.7)
MCV: 95 fL (ref 79–97)
Platelets: 230 10*3/uL (ref 150–450)
RBC: 4.1 x10E6/uL (ref 3.77–5.28)
RDW: 13.1 % (ref 11.7–15.4)
WBC: 3.6 10*3/uL (ref 3.4–10.8)

## 2020-06-15 LAB — COMPREHENSIVE METABOLIC PANEL
ALT: 33 IU/L — ABNORMAL HIGH (ref 0–32)
AST: 27 IU/L (ref 0–40)
Albumin/Globulin Ratio: 1.6 (ref 1.2–2.2)
Albumin: 5 g/dL — ABNORMAL HIGH (ref 3.8–4.8)
Alkaline Phosphatase: 78 IU/L (ref 44–121)
BUN/Creatinine Ratio: 9 — ABNORMAL LOW (ref 12–28)
BUN: 7 mg/dL — ABNORMAL LOW (ref 8–27)
Bilirubin Total: 0.3 mg/dL (ref 0.0–1.2)
CO2: 23 mmol/L (ref 20–29)
Calcium: 9.5 mg/dL (ref 8.7–10.3)
Chloride: 102 mmol/L (ref 96–106)
Creatinine, Ser: 0.81 mg/dL (ref 0.57–1.00)
Globulin, Total: 3.1 g/dL (ref 1.5–4.5)
Glucose: 96 mg/dL (ref 65–99)
Potassium: 3.1 mmol/L — ABNORMAL LOW (ref 3.5–5.2)
Sodium: 143 mmol/L (ref 134–144)
Total Protein: 8.1 g/dL (ref 6.0–8.5)
eGFR: 82 mL/min/{1.73_m2} (ref >59)

## 2020-06-15 LAB — LIPID PANEL
Chol/HDL Ratio: 2.4 ratio (ref 0.0–4.4)
Cholesterol, Total: 157 mg/dL (ref 100–199)
HDL: 66 mg/dL (ref >39)
LDL Chol Calc (NIH): 77 mg/dL (ref 0–99)
Triglycerides: 70 mg/dL (ref 0–149)
VLDL Cholesterol Cal: 14 mg/dL (ref 5–40)

## 2020-06-15 MED ORDER — POTASSIUM CHLORIDE ER 10 MEQ PO TBCR
10.0000 meq | EXTENDED_RELEASE_TABLET | Freq: Every day | ORAL | 1 refills | Status: DC
  Filled 2020-06-15: qty 30, 30d supply, fill #0

## 2020-06-15 NOTE — Telephone Encounter (Signed)
Phone call placed to patient today to go over lab results.  Patient informed that potassium level is low.  She is currently on lisinopril and spironolactone.  Reports compliance.  I recommend that we start a low-dose of potassium supplement like 8 to 10 mg daily.  After she has been on it for 1 week, she should return to the lab to have potassium level rechecked. -Form that her blood cell counts are normal and cholesterol level looks good. -Patient will come to the pharmacy to pick up potassium supplement and return to the lab after she has been on it for 1 week to have potassium level rechecked.  Results for orders placed or performed in visit on 06/14/20  CBC  Result Value Ref Range   WBC 3.6 3.4 - 10.8 x10E3/uL   RBC 4.10 3.77 - 5.28 x10E6/uL   Hemoglobin 12.7 11.1 - 15.9 g/dL   Hematocrit 38.9 34.0 - 46.6 %   MCV 95 79 - 97 fL   MCH 31.0 26.6 - 33.0 pg   MCHC 32.6 31.5 - 35.7 g/dL   RDW 13.1 11.7 - 15.4 %   Platelets 230 150 - 450 x10E3/uL  Comprehensive metabolic panel  Result Value Ref Range   Glucose 96 65 - 99 mg/dL   BUN 7 (L) 8 - 27 mg/dL   Creatinine, Ser 0.81 0.57 - 1.00 mg/dL   eGFR 82 >59 mL/min/1.73   BUN/Creatinine Ratio 9 (L) 12 - 28   Sodium 143 134 - 144 mmol/L   Potassium 3.1 (L) 3.5 - 5.2 mmol/L   Chloride 102 96 - 106 mmol/L   CO2 23 20 - 29 mmol/L   Calcium 9.5 8.7 - 10.3 mg/dL   Total Protein 8.1 6.0 - 8.5 g/dL   Albumin 5.0 (H) 3.8 - 4.8 g/dL   Globulin, Total 3.1 1.5 - 4.5 g/dL   Albumin/Globulin Ratio 1.6 1.2 - 2.2   Bilirubin Total 0.3 0.0 - 1.2 mg/dL   Alkaline Phosphatase 78 44 - 121 IU/L   AST 27 0 - 40 IU/L   ALT 33 (H) 0 - 32 IU/L  Lipid panel  Result Value Ref Range   Cholesterol, Total 157 100 - 199 mg/dL   Triglycerides 70 0 - 149 mg/dL   HDL 66 >39 mg/dL   VLDL Cholesterol Cal 14 5 - 40 mg/dL   LDL Chol Calc (NIH) 77 0 - 99 mg/dL   Chol/HDL Ratio 2.4 0.0 - 4.4 ratio

## 2020-06-18 ENCOUNTER — Other Ambulatory Visit: Payer: Self-pay

## 2020-06-19 ENCOUNTER — Other Ambulatory Visit: Payer: Self-pay

## 2020-06-19 ENCOUNTER — Other Ambulatory Visit: Payer: Self-pay | Admitting: Internal Medicine

## 2020-06-19 DIAGNOSIS — Z760 Encounter for issue of repeat prescription: Secondary | ICD-10-CM

## 2020-06-19 DIAGNOSIS — I10 Essential (primary) hypertension: Secondary | ICD-10-CM

## 2020-06-19 MED ORDER — CARVEDILOL 6.25 MG PO TABS
ORAL_TABLET | Freq: Two times a day (BID) | ORAL | 0 refills | Status: DC
  Filled 2020-06-19: qty 60, 30d supply, fill #0

## 2020-06-19 MED ORDER — HYDRALAZINE HCL 25 MG PO TABS
ORAL_TABLET | ORAL | 0 refills | Status: DC
  Filled 2020-06-19: qty 60, 30d supply, fill #0

## 2020-06-19 MED ORDER — MELOXICAM 15 MG PO TABS
ORAL_TABLET | Freq: Every day | ORAL | 0 refills | Status: DC
  Filled 2020-06-19: qty 90, 90d supply, fill #0

## 2020-06-19 MED FILL — Omeprazole Cap Delayed Release 40 MG: ORAL | 30 days supply | Qty: 30 | Fill #0 | Status: AC

## 2020-06-20 ENCOUNTER — Other Ambulatory Visit: Payer: Self-pay

## 2020-06-21 ENCOUNTER — Other Ambulatory Visit: Payer: Self-pay

## 2020-06-29 ENCOUNTER — Other Ambulatory Visit (HOSPITAL_COMMUNITY): Payer: Self-pay

## 2020-07-02 ENCOUNTER — Other Ambulatory Visit: Payer: Self-pay

## 2020-07-02 ENCOUNTER — Other Ambulatory Visit: Payer: Self-pay | Admitting: Internal Medicine

## 2020-07-02 MED ORDER — ATORVASTATIN CALCIUM 40 MG PO TABS
ORAL_TABLET | Freq: Every day | ORAL | 0 refills | Status: DC
  Filled 2020-07-02: qty 30, 30d supply, fill #0

## 2020-07-03 ENCOUNTER — Other Ambulatory Visit: Payer: Self-pay

## 2020-07-05 ENCOUNTER — Other Ambulatory Visit: Payer: Self-pay

## 2020-07-10 ENCOUNTER — Other Ambulatory Visit: Payer: Self-pay | Admitting: Internal Medicine

## 2020-07-10 ENCOUNTER — Other Ambulatory Visit: Payer: Self-pay

## 2020-07-10 DIAGNOSIS — I70219 Atherosclerosis of native arteries of extremities with intermittent claudication, unspecified extremity: Secondary | ICD-10-CM

## 2020-07-10 MED ORDER — ALBUTEROL SULFATE HFA 108 (90 BASE) MCG/ACT IN AERS
2.0000 | INHALATION_SPRAY | Freq: Four times a day (QID) | RESPIRATORY_TRACT | 0 refills | Status: DC | PRN
  Filled 2020-07-10 – 2020-07-17 (×2): qty 6.7, 25d supply, fill #0

## 2020-07-10 MED ORDER — GABAPENTIN 300 MG PO CAPS
ORAL_CAPSULE | Freq: Every day | ORAL | 0 refills | Status: DC
  Filled 2020-07-10: qty 30, 30d supply, fill #0

## 2020-07-10 MED ORDER — AMLODIPINE BESYLATE 10 MG PO TABS
ORAL_TABLET | Freq: Every day | ORAL | 0 refills | Status: DC
  Filled 2020-07-10: qty 30, 30d supply, fill #0

## 2020-07-10 NOTE — Telephone Encounter (Signed)
  Notes to clinic:   non-formulary. can you resend for ventolin or proair  Requested Prescriptions  Pending Prescriptions Disp Refills   albuterol (PROVENTIL HFA) 108 (90 Base) MCG/ACT inhaler 6.7 g 0    Sig: INHALE 2 PUFFS INTO THE LUNGS EVERY 6 (SIX) HOURS AS NEEDED FOR WHEEZING OR SHORTNESS OF BREATH.      Pulmonology:  Beta Agonists Failed - 07/10/2020 10:09 AM      Failed - One inhaler should last at least one month. If the patient is requesting refills earlier, contact the patient to check for uncontrolled symptoms.      Passed - Valid encounter within last 12 months    Recent Outpatient Visits           3 weeks ago Essential hypertension   [place] [name], MD   4 months ago Abscess of abdominal wall   [place] [name], MD   5 months ago Essential hypertension   [place] [name], MD   10 months ago Encounter for [employer] annual wellness exam   [place] [name], MD   1 year ago Essential hypertension   [place] [name], MD

## 2020-07-10 NOTE — Telephone Encounter (Signed)
Requested Prescriptions  Pending Prescriptions Disp Refills  . albuterol (PROVENTIL HFA) 108 (90 Base) MCG/ACT inhaler 6.7 g 0    Sig: INHALE 2 PUFFS INTO THE LUNGS EVERY 6 (SIX) HOURS AS NEEDED FOR WHEEZING OR SHORTNESS OF BREATH.     Pulmonology:  Beta Agonists Failed - 07/10/2020  9:21 AM      Failed - One inhaler should last at least one month. If the patient is requesting refills earlier, contact the patient to check for uncontrolled symptoms.      Passed - Valid encounter within last 12 months    Recent Outpatient Visits          3 weeks ago Essential hypertension   [place] [name], MD   4 months ago Abscess of abdominal wall   [place] [name], MD   5 months ago Essential hypertension   [place] [name], MD   10 months ago Encounter for [employer] annual wellness exam   [place] [name], MD   1 year ago Essential hypertension   [place] Karle Plumber B, MD             . gabapentin (NEURONTIN) 300 MG capsule 30 capsule 0    Sig: TAKE 1 CAPSULE (300 MG TOTAL) BY MOUTH AT BEDTIME.     Neurology: Anticonvulsants - gabapentin Passed - 07/10/2020  9:21 AM      Passed - Valid encounter within last 12 months    Recent Outpatient Visits          3 weeks ago Essential hypertension   [place] [name], MD   4 months ago Abscess of abdominal wall   [place] [name], MD   5 months ago Essential hypertension   [place] [name], MD   10 months ago Encounter for [employer] annual wellness exam   [place] [name], MD   1 year ago Essential hypertension   [place] Karle Plumber B, MD             . amLODipine (NORVASC) 10 MG tablet 30 tablet 0    Sig: TAKE 1 TABLET (10 MG TOTAL) BY MOUTH AT BEDTIME.     Cardiovascular:  Calcium Channel Blockers Failed - 07/10/2020  9:21 AM      Failed - Last BP in normal range    BP Readings from Last 1 Encounters:  06/14/20 (!) 148/84         Passed - Valid encounter within last 6 months    Recent Outpatient Visits          3 weeks ago Essential hypertension   [place] Karle Plumber B, MD   4 months ago Abscess of abdominal wall   [place] [name], MD   5 months ago Essential hypertension   [place] [name], MD   10 months ago Encounter for [employer] annual wellness exam   [place] [name], MD   1 year ago Essential hypertension   [place] [name], MD

## 2020-07-11 ENCOUNTER — Other Ambulatory Visit: Payer: Self-pay

## 2020-07-12 ENCOUNTER — Other Ambulatory Visit: Payer: Self-pay

## 2020-07-17 ENCOUNTER — Ambulatory Visit: Payer: Medicare Other | Attending: Internal Medicine

## 2020-07-17 ENCOUNTER — Other Ambulatory Visit: Payer: Self-pay

## 2020-07-17 ENCOUNTER — Other Ambulatory Visit: Payer: Self-pay | Admitting: Internal Medicine

## 2020-07-17 ENCOUNTER — Other Ambulatory Visit: Payer: Self-pay | Admitting: Pharmacist

## 2020-07-17 DIAGNOSIS — E875 Hyperkalemia: Secondary | ICD-10-CM

## 2020-07-17 DIAGNOSIS — Z760 Encounter for issue of repeat prescription: Secondary | ICD-10-CM

## 2020-07-17 MED ORDER — OMEPRAZOLE 40 MG PO CPDR
DELAYED_RELEASE_CAPSULE | Freq: Every day | ORAL | 0 refills | Status: DC
  Filled 2020-07-17: qty 30, 30d supply, fill #0

## 2020-07-17 MED ORDER — ALBUTEROL SULFATE HFA 108 (90 BASE) MCG/ACT IN AERS
2.0000 | INHALATION_SPRAY | Freq: Four times a day (QID) | RESPIRATORY_TRACT | 2 refills | Status: DC | PRN
  Filled 2020-07-17: qty 8.5, 16d supply, fill #0
  Filled 2020-09-06: qty 8.5, 16d supply, fill #1
  Filled 2020-09-13: qty 8.5, 25d supply, fill #1
  Filled 2020-10-02: qty 8.5, 25d supply, fill #2

## 2020-07-18 ENCOUNTER — Other Ambulatory Visit: Payer: Self-pay | Admitting: Internal Medicine

## 2020-07-18 ENCOUNTER — Other Ambulatory Visit: Payer: Self-pay

## 2020-07-18 DIAGNOSIS — E875 Hyperkalemia: Secondary | ICD-10-CM

## 2020-07-18 DIAGNOSIS — E876 Hypokalemia: Secondary | ICD-10-CM

## 2020-07-18 LAB — POTASSIUM: Potassium: 3.4 mmol/L — ABNORMAL LOW (ref 3.5–5.2)

## 2020-07-18 MED ORDER — POTASSIUM CHLORIDE ER 10 MEQ PO TBCR
20.0000 meq | EXTENDED_RELEASE_TABLET | Freq: Every day | ORAL | 4 refills | Status: DC
  Filled 2020-07-18 – 2020-07-25 (×2): qty 60, 30d supply, fill #0
  Filled 2020-09-19: qty 60, 30d supply, fill #1
  Filled 2020-11-22: qty 60, 30d supply, fill #2
  Filled 2021-01-17: qty 60, 30d supply, fill #3
  Filled 2021-03-26: qty 60, 30d supply, fill #0
  Filled 2021-03-26: qty 60, 30d supply, fill #4

## 2020-07-24 ENCOUNTER — Other Ambulatory Visit: Payer: Self-pay | Admitting: Internal Medicine

## 2020-07-24 ENCOUNTER — Other Ambulatory Visit: Payer: Self-pay

## 2020-07-24 DIAGNOSIS — I10 Essential (primary) hypertension: Secondary | ICD-10-CM

## 2020-07-24 MED ORDER — HYDRALAZINE HCL 25 MG PO TABS
ORAL_TABLET | ORAL | 0 refills | Status: DC
  Filled 2020-07-24: qty 60, 30d supply, fill #0

## 2020-07-24 MED ORDER — CARVEDILOL 6.25 MG PO TABS
ORAL_TABLET | Freq: Two times a day (BID) | ORAL | 0 refills | Status: DC
  Filled 2020-07-24: qty 60, 30d supply, fill #0

## 2020-07-24 NOTE — Telephone Encounter (Signed)
Requested Prescriptions  Pending Prescriptions Disp Refills  . carvedilol (COREG) 6.25 MG tablet 60 tablet 0    Sig: TAKE 1 TABLET (6.25 MG TOTAL) BY MOUTH 2 (TWO) TIMES DAILY.     Cardiovascular:  Beta Blockers Failed - 07/24/2020  9:34 AM      Failed - Last BP in normal range    BP Readings from Last 1 Encounters:  06/14/20 (!) 148/84         Passed - Last Heart Rate in normal range    Pulse Readings from Last 1 Encounters:  06/14/20 66         Passed - Valid encounter within last 6 months    Recent Outpatient Visits          1 month ago Essential hypertension   [place] Karle Plumber B, MD   5 months ago Abscess of abdominal wall   [place] [name], MD   5 months ago Essential hypertension   [place] [name], MD   10 months ago Encounter for [employer] annual wellness exam   [place] [name], MD   1 year ago Essential hypertension   [place] Karle Plumber B, MD             . hydrALAZINE (APRESOLINE) 25 MG tablet 60 tablet 0    Sig: TAKE 1 TABLET (25 MG TOTAL) BY MOUTH 2 (TWO) TIMES DAILY. MAY TAKE AN TABLET IF BLOOD PRESSURE IS ABOVE 150/90 AFTER ONE HOUR RECHECK.     Cardiovascular:  Vasodilators Failed - 07/24/2020  9:34 AM      Failed - Last BP in normal range    BP Readings from Last 1 Encounters:  06/14/20 (!) 148/84         Passed - HCT in normal range and within 360 days    HCT  Date Value Ref Range Status  06/12/2008 40.2 34.8 - 46.6 % Final   Hematocrit  Date Value Ref Range Status  06/14/2020 38.9 34.0 - 46.6 % Final         Passed - HGB in normal range and within 360 days    Hemoglobin  Date Value Ref Range Status  06/14/2020 12.7 11.1 - 15.9 g/dL Final   HGB  Date Value Ref Range Status  06/12/2008 13.8 11.6 - 15.9 g/dL Final         Passed  - RBC in normal range and within 360 days    RBC  Date Value Ref Range Status  06/14/2020 4.10 3.77 - 5.28 x10E6/uL Final  08/25/2017 4.22 3.87 - 5.11 MIL/uL Final         Passed - WBC in normal range and within 360 days    WBC  Date Value Ref Range Status  06/14/2020 3.6 3.4 - 10.8 x10E3/uL Final  08/25/2017 6.6 4.0 - 10.5 K/uL Final         Passed - PLT in normal range and within 360 days    Platelets  Date Value Ref Range Status  06/14/2020 230 150 - 450 x10E3/uL Final         Passed - Valid encounter within last 12 months    Recent Outpatient Visits          1 month ago Essential hypertension   [place] [name], MD  5 months ago Abscess of abdominal wall   [place], MD   5 months ago Essential hypertension   [place] [name], MD   10 months ago Encounter for [employer] annual wellness exam   [place] [name], MD   1 year ago Essential hypertension   [place] [name], MD

## 2020-07-25 ENCOUNTER — Other Ambulatory Visit: Payer: Self-pay

## 2020-08-01 ENCOUNTER — Other Ambulatory Visit: Payer: Self-pay | Admitting: Internal Medicine

## 2020-08-01 ENCOUNTER — Other Ambulatory Visit: Payer: Self-pay

## 2020-08-01 DIAGNOSIS — I70219 Atherosclerosis of native arteries of extremities with intermittent claudication, unspecified extremity: Secondary | ICD-10-CM

## 2020-08-01 DIAGNOSIS — I10 Essential (primary) hypertension: Secondary | ICD-10-CM

## 2020-08-01 MED ORDER — SPIRONOLACTONE 25 MG PO TABS
ORAL_TABLET | Freq: Every day | ORAL | 0 refills | Status: DC
  Filled 2020-08-01: qty 60, 60d supply, fill #0

## 2020-08-01 MED ORDER — AMLODIPINE BESYLATE 10 MG PO TABS
ORAL_TABLET | Freq: Every day | ORAL | 0 refills | Status: DC
  Filled 2020-08-01 – 2020-08-14 (×2): qty 30, 30d supply, fill #0

## 2020-08-01 MED ORDER — GABAPENTIN 300 MG PO CAPS
ORAL_CAPSULE | Freq: Every day | ORAL | 0 refills | Status: DC
  Filled 2020-08-01 – 2020-08-14 (×2): qty 30, 30d supply, fill #0

## 2020-08-01 MED ORDER — ATORVASTATIN CALCIUM 40 MG PO TABS
ORAL_TABLET | Freq: Every day | ORAL | 0 refills | Status: DC
  Filled 2020-08-01: qty 30, 30d supply, fill #0

## 2020-08-01 MED FILL — Fluticasone-Salmeterol Aer Powder BA 100-50 MCG/ACT: RESPIRATORY_TRACT | 30 days supply | Qty: 60 | Fill #0 | Status: AC

## 2020-08-02 ENCOUNTER — Other Ambulatory Visit: Payer: Self-pay

## 2020-08-14 ENCOUNTER — Other Ambulatory Visit: Payer: Self-pay | Admitting: Internal Medicine

## 2020-08-14 ENCOUNTER — Other Ambulatory Visit: Payer: Self-pay

## 2020-08-14 DIAGNOSIS — Z760 Encounter for issue of repeat prescription: Secondary | ICD-10-CM

## 2020-08-14 MED ORDER — OMEPRAZOLE 40 MG PO CPDR
DELAYED_RELEASE_CAPSULE | Freq: Every day | ORAL | 0 refills | Status: DC
  Filled 2020-08-14: qty 30, 30d supply, fill #0

## 2020-08-14 NOTE — Telephone Encounter (Signed)
Requested medication (s) are due for refill today: yes  Requested medication (s) are on the active medication list: yes  Last refill:  04/04/20-04/04/21 176ml 0 refills  Future visit scheduled: no  Notes to clinic:  medication not assigned to a protocol. Do you want to refill Rx?     Requested Prescriptions  Pending Prescriptions Disp Refills   nystatin (MYCOSTATIN) 100000 UNIT/ML suspension 100 mL 0    Sig: TAKE 5 MLS (500,000 UNITS TOTAL) BY MOUTH 4 (FOUR) TIMES DAILY. SWISH AND SWALLOW      Off-Protocol Failed - 08/14/2020  4:27 PM      Failed - Medication not assigned to a protocol, review manually.      Passed - Valid encounter within last 12 months    Recent Outpatient Visits           2 months ago Essential hypertension   [place] Karle Plumber B, MD   6 months ago Abscess of abdominal wall   [place] [name], MD   6 months ago Essential hypertension   [place] [name], MD   11 months ago Encounter for [employer] annual wellness exam   [place] [name], MD   1 year ago Essential hypertension   [place], MD                 Signed Prescriptions Disp Refills   omeprazole (PRILOSEC) 40 MG capsule 30 capsule 0    Sig: TAKE 1 CAPSULE (40 MG TOTAL) BY MOUTH DAILY.      Gastroenterology: Proton Pump Inhibitors Passed - 08/14/2020  4:27 PM      Passed - Valid encounter within last 12 months    Recent Outpatient Visits           2 months ago Essential hypertension   [place] [name], MD   6 months ago Abscess of abdominal wall   [place] [name], MD   6 months ago Essential hypertension   [place] [name], MD   11 months  ago Encounter for [employer] annual wellness exam   [place] [name], MD   1 year ago Essential hypertension   [place] [name], MD

## 2020-08-15 ENCOUNTER — Other Ambulatory Visit: Payer: Self-pay

## 2020-08-15 MED ORDER — NYSTATIN 100000 UNIT/ML MT SUSP
OROMUCOSAL | 0 refills | Status: DC
  Filled 2020-08-15 – 2020-08-23 (×2): qty 100, 5d supply, fill #0

## 2020-08-20 ENCOUNTER — Other Ambulatory Visit: Payer: Self-pay

## 2020-08-20 ENCOUNTER — Other Ambulatory Visit: Payer: Self-pay | Admitting: Internal Medicine

## 2020-08-20 ENCOUNTER — Ambulatory Visit
Admission: RE | Admit: 2020-08-20 | Discharge: 2020-08-20 | Disposition: A | Discharge status: Home / Self Care | Payer: Medicare Other | Source: Ambulatory Visit | Attending: Internal Medicine | Admitting: Internal Medicine

## 2020-08-20 DIAGNOSIS — Z1231 Encounter for screening mammogram for malignant neoplasm of breast: Secondary | ICD-10-CM | POA: Diagnosis not present

## 2020-08-22 ENCOUNTER — Other Ambulatory Visit: Payer: Self-pay

## 2020-08-23 ENCOUNTER — Other Ambulatory Visit: Payer: Self-pay | Admitting: Internal Medicine

## 2020-08-23 ENCOUNTER — Other Ambulatory Visit: Payer: Self-pay

## 2020-08-23 DIAGNOSIS — I10 Essential (primary) hypertension: Secondary | ICD-10-CM

## 2020-08-23 MED ORDER — HYDRALAZINE HCL 25 MG PO TABS
ORAL_TABLET | ORAL | 0 refills | Status: DC
  Filled 2020-08-23: qty 60, 30d supply, fill #0

## 2020-08-23 MED ORDER — CARVEDILOL 6.25 MG PO TABS
ORAL_TABLET | Freq: Two times a day (BID) | ORAL | 0 refills | Status: DC
  Filled 2020-08-23: qty 60, 30d supply, fill #0

## 2020-08-24 ENCOUNTER — Other Ambulatory Visit: Payer: Self-pay

## 2020-08-29 ENCOUNTER — Other Ambulatory Visit: Payer: Self-pay

## 2020-08-29 ENCOUNTER — Other Ambulatory Visit: Payer: Self-pay | Admitting: Internal Medicine

## 2020-08-29 MED ORDER — ATORVASTATIN CALCIUM 40 MG PO TABS
ORAL_TABLET | Freq: Every day | ORAL | 0 refills | Status: DC
  Filled 2020-08-29: qty 30, 30d supply, fill #0

## 2020-08-30 ENCOUNTER — Other Ambulatory Visit: Payer: Self-pay

## 2020-09-06 ENCOUNTER — Other Ambulatory Visit: Payer: Self-pay

## 2020-09-12 ENCOUNTER — Other Ambulatory Visit: Payer: Self-pay | Admitting: Internal Medicine

## 2020-09-12 DIAGNOSIS — I70219 Atherosclerosis of native arteries of extremities with intermittent claudication, unspecified extremity: Secondary | ICD-10-CM

## 2020-09-12 DIAGNOSIS — Z760 Encounter for issue of repeat prescription: Secondary | ICD-10-CM

## 2020-09-12 NOTE — Telephone Encounter (Signed)
Luke could you refill if appropriate  

## 2020-09-12 NOTE — Telephone Encounter (Signed)
Requested medication (s) are due for refill today: yes  Requested medication (s) are on the active medication list: yes  Last refill:  norvasc, neurontin - 08/01/20-08/01/21 #30 0 refills  , prilosec 08/14/20-08/14/21 #30 0 refills  Future visit scheduled: no   Notes to clinic:  [place]     Requested Prescriptions  Pending Prescriptions Disp Refills   amLODipine (NORVASC) 10 MG tablet 30 tablet 0    Sig: Take by mouth at bedtime.      Cardiovascular:  Calcium Channel Blockers Failed - 09/12/2020  2:07 PM      Failed - Last BP in normal range    BP Readings from Last 1 Encounters:  06/14/20 (!) 148/84          Passed - Valid encounter within last 6 months    Recent Outpatient Visits           3 months ago Essential hypertension   [place] Karle Plumber B, MD   7 months ago Abscess of abdominal wall   [place] [name], MD   7 months ago Essential hypertension   [place] [name], MD   1 year ago Encounter for Medicare annual wellness exam   [place] [name], MD   1 year ago Essential hypertension   [place] [name], MD                  gabapentin (NEURONTIN) 300 MG capsule 30 capsule 0    Sig: Take by mouth at bedtime.      Neurology: Anticonvulsants - gabapentin Passed - 09/12/2020  2:07 PM      Passed - Valid encounter within last 12 months    Recent Outpatient Visits           3 months ago Essential hypertension   [place] [name], MD   7 months ago Abscess of abdominal wall   [place] [name], MD   7 months ago Essential hypertension   [place] [name], MD   1 year ago Encounter for Medicare annual wellness exam   [place] [name], MD   1 year ago Essential hypertension   [place], MD                  omeprazole (PRILOSEC) 40 MG capsule 30 capsule 0    Sig: Take by mouth daily.      Gastroenterology: Proton Pump Inhibitors Passed - 09/12/2020  2:07 PM      Passed - Valid encounter within last 12 months    Recent Outpatient Visits           3 months ago Essential hypertension   [place] [name], MD   7 months ago Abscess of abdominal wall   [place] [name], MD   7 months ago Essential hypertension   [place] [facility] [name], MD   1 year ago Encounter for [employer] annual wellness exam   [place] [name], MD   1 year ago Essential hypertension   [place]  [facility] Wynetta Emery, Dalbert Batman, MD

## 2020-09-13 ENCOUNTER — Other Ambulatory Visit: Payer: Self-pay

## 2020-09-14 ENCOUNTER — Other Ambulatory Visit: Payer: Self-pay | Admitting: Internal Medicine

## 2020-09-14 ENCOUNTER — Other Ambulatory Visit: Payer: Self-pay

## 2020-09-14 DIAGNOSIS — I70219 Atherosclerosis of native arteries of extremities with intermittent claudication, unspecified extremity: Secondary | ICD-10-CM

## 2020-09-14 DIAGNOSIS — Z760 Encounter for issue of repeat prescription: Secondary | ICD-10-CM

## 2020-09-14 DIAGNOSIS — I10 Essential (primary) hypertension: Secondary | ICD-10-CM

## 2020-09-14 MED ORDER — AMLODIPINE BESYLATE 10 MG PO TABS
ORAL_TABLET | Freq: Every day | ORAL | 0 refills | Status: DC
  Filled 2020-09-14: qty 30, 30d supply, fill #0

## 2020-09-14 MED ORDER — GABAPENTIN 300 MG PO CAPS
ORAL_CAPSULE | Freq: Every day | ORAL | 0 refills | Status: DC
  Filled 2020-09-14: qty 30, 30d supply, fill #0

## 2020-09-14 MED ORDER — LISINOPRIL 40 MG PO TABS: ORAL_TABLET | Freq: Every day | ORAL | 0 refills | Status: DC

## 2020-09-14 MED ORDER — OMEPRAZOLE 40 MG PO CPDR
DELAYED_RELEASE_CAPSULE | Freq: Every day | ORAL | 0 refills | Status: DC
  Filled 2020-09-14: qty 30, 30d supply, fill #0

## 2020-09-14 NOTE — Telephone Encounter (Signed)
Copied from [place] [id number]. Topic: Quick Communication - Rx Refill/Question >> Sep 14, 2020  1:12 PM Tessa Lerner A wrote: Medication: Rx #: [id number]  lisinopril (ZESTRIL) 40 MG tablet   Rx #: [id number]  amLODipine (NORVASC) 10 MG tablet   Rx #: [id number]  gabapentin (NEURONTIN) 300 MG capsule    Rx #: [id number]  omeprazole (PRILOSEC) 40 MG capsule    Has the patient contacted their pharmacy? Yes.   (Agent: If no, request that the patient contact the pharmacy for the refill.) (Agent: If yes, when and what did the pharmacy advise?)  Preferred Pharmacy (with phone number or street name): [facility]  Phone:  [phone] Fax:  [phone]  Agent: Please be advised that RX refills may take up to 3 business days. We ask that you follow-up with your pharmacy.

## 2020-09-17 ENCOUNTER — Other Ambulatory Visit: Payer: Self-pay

## 2020-09-19 ENCOUNTER — Other Ambulatory Visit: Payer: Self-pay

## 2020-09-19 ENCOUNTER — Other Ambulatory Visit: Payer: Self-pay | Admitting: Internal Medicine

## 2020-09-19 DIAGNOSIS — I10 Essential (primary) hypertension: Secondary | ICD-10-CM

## 2020-09-19 MED ORDER — CARVEDILOL 6.25 MG PO TABS
ORAL_TABLET | Freq: Two times a day (BID) | ORAL | 0 refills | Status: DC
  Filled 2020-09-19: qty 60, 30d supply, fill #0

## 2020-09-19 MED ORDER — HYDRALAZINE HCL 25 MG PO TABS
ORAL_TABLET | ORAL | 0 refills | Status: DC
  Filled 2020-09-19: qty 60, 30d supply, fill #0

## 2020-09-21 ENCOUNTER — Other Ambulatory Visit: Payer: Self-pay

## 2020-09-24 ENCOUNTER — Other Ambulatory Visit: Payer: Self-pay

## 2020-10-01 ENCOUNTER — Other Ambulatory Visit: Payer: Self-pay

## 2020-10-01 ENCOUNTER — Other Ambulatory Visit: Payer: Self-pay | Admitting: Internal Medicine

## 2020-10-01 DIAGNOSIS — I10 Essential (primary) hypertension: Secondary | ICD-10-CM

## 2020-10-01 MED ORDER — ATORVASTATIN CALCIUM 40 MG PO TABS
40.0000 mg | ORAL_TABLET | Freq: Every day | ORAL | 2 refills | Status: DC
  Filled 2020-10-01: qty 90, 90d supply, fill #0

## 2020-10-01 MED ORDER — SPIRONOLACTONE 25 MG PO TABS
25.0000 mg | ORAL_TABLET | Freq: Every day | ORAL | 0 refills | Status: DC
  Filled 2020-10-01: qty 90, 90d supply, fill #0

## 2020-10-02 ENCOUNTER — Other Ambulatory Visit: Payer: Self-pay

## 2020-10-02 ENCOUNTER — Other Ambulatory Visit: Payer: Self-pay | Admitting: Internal Medicine

## 2020-10-02 DIAGNOSIS — J449 Chronic obstructive pulmonary disease, unspecified: Secondary | ICD-10-CM

## 2020-10-02 DIAGNOSIS — Z760 Encounter for issue of repeat prescription: Secondary | ICD-10-CM

## 2020-10-02 NOTE — Telephone Encounter (Signed)
Requested medication (s) are due for refill today: Yes  Requested medication (s) are on the active medication list: Yes  Last refill:  05/23/20  Future visit scheduled: No  Notes to clinic:  Pt. Asking for 90 day supply.    Requested Prescriptions  Pending Prescriptions Disp Refills   fluticasone-salmeterol (ADVAIR DISKUS) 100-50 MCG/ACT AEPB 60 each 0    Sig: INHALE 1 PUFF INTO THE LUNGS 2 (TWO) TIMES DAILY.      Pulmonology:  Combination Products Passed - 10/02/2020  9:39 AM      Passed - Valid encounter within last 12 months    Recent Outpatient Visits           3 months ago Essential hypertension   [place] [name], MD   7 months ago Abscess of abdominal wall   [place] [name], MD   8 months ago Essential hypertension   [place] [name], MD   1 year ago Encounter for [employer] annual wellness exam   [place] [name], MD   1 year ago Essential hypertension   [place], MD                  meloxicam (MOBIC) 15 MG tablet 90 tablet 0    Sig: TAKE 1 TABLET (15 MG TOTAL) BY MOUTH DAILY.      Analgesics:  COX2 Inhibitors Passed - 10/02/2020  9:39 AM      Passed - HGB in normal range and within 360 days    Hemoglobin  Date Value Ref Range Status  06/14/2020 12.7 11.1 - 15.9 g/dL Final   HGB  Date Value Ref Range Status  06/12/2008 13.8 11.6 - 15.9 g/dL Final          Passed - Cr in normal range and within 360 days    Creat  Date Value Ref Range Status  04/17/2016 0.77 0.50 - 1.05 mg/dL Final    Comment:      For patients > or = 64 years of age: The upper reference limit for Creatinine is approximately 13% higher for people identified as African-American.      Creatinine, Ser  Date Value Ref Range Status  06/14/2020 0.81 0.57 - 1.00  mg/dL Final          Passed - Patient is not pregnant      Passed - Valid encounter within last 12 months    Recent Outpatient Visits           3 months ago Essential hypertension   [place] Karle Plumber B, MD   7 months ago Abscess of abdominal wall   [place] [name], MD   8 months ago Essential hypertension   [facility] [name], MD   1 year ago Encounter for [employer] annual wellness exam   [place] [name], MD   1 year ago Essential hypertension   [place] [name], MD

## 2020-10-04 ENCOUNTER — Other Ambulatory Visit: Payer: Self-pay

## 2020-10-04 MED ORDER — MELOXICAM 15 MG PO TABS
15.0000 mg | ORAL_TABLET | Freq: Every day | ORAL | 0 refills | Status: DC
  Filled 2020-10-04: qty 30, 30d supply, fill #0

## 2020-10-04 MED ORDER — FLUTICASONE-SALMETEROL 100-50 MCG/ACT IN AEPB
1.0000 | INHALATION_SPRAY | Freq: Two times a day (BID) | RESPIRATORY_TRACT | 0 refills | Status: DC
  Filled 2020-10-04: qty 60, 30d supply, fill #0

## 2020-10-05 ENCOUNTER — Other Ambulatory Visit: Payer: Self-pay | Admitting: Internal Medicine

## 2020-10-05 ENCOUNTER — Other Ambulatory Visit: Payer: Self-pay

## 2020-10-05 DIAGNOSIS — I10 Essential (primary) hypertension: Secondary | ICD-10-CM

## 2020-10-05 DIAGNOSIS — I70219 Atherosclerosis of native arteries of extremities with intermittent claudication, unspecified extremity: Secondary | ICD-10-CM

## 2020-10-05 DIAGNOSIS — Z760 Encounter for issue of repeat prescription: Secondary | ICD-10-CM

## 2020-10-07 MED ORDER — AMLODIPINE BESYLATE 10 MG PO TABS: ORAL_TABLET | Freq: Every day | ORAL | 0 refills | Status: DC

## 2020-10-07 MED ORDER — GABAPENTIN 300 MG PO CAPS: ORAL_CAPSULE | Freq: Every day | ORAL | 0 refills | Status: DC

## 2020-10-07 NOTE — Telephone Encounter (Signed)
Requested Prescriptions  Pending Prescriptions Disp Refills  . atorvastatin (LIPITOR) 40 MG tablet [Pharmacy Med Name: Atorvastatin Calcium 40 MG Oral Tablet] 90 tablet 3    Sig: TAKE 1 TABLET BY MOUTH  DAILY     Cardiovascular:  Antilipid - Statins Passed - 10/07/2020  6:53 PM      Passed - Total Cholesterol in normal range and within 360 days    Cholesterol, Total  Date Value Ref Range Status  06/14/2020 157 100 - 199 mg/dL Final         Passed - LDL in normal range and within 360 days    LDL Chol Calc (NIH)  Date Value Ref Range Status  06/14/2020 77 0 - 99 mg/dL Final         Passed - HDL in normal range and within 360 days    HDL  Date Value Ref Range Status  06/14/2020 66 >39 mg/dL Final         Passed - Triglycerides in normal range and within 360 days    Triglycerides  Date Value Ref Range Status  06/14/2020 70 0 - 149 mg/dL Final         Passed - Patient is not pregnant      Passed - Valid encounter within last 12 months    Recent Outpatient Visits          3 months ago Essential hypertension   [place] Karle Plumber B, MD   7 months ago Abscess of abdominal wall   [place] [name], MD   8 months ago Essential hypertension   [place] [name], MD   1 year ago Encounter for Medicare annual wellness exam   [place] [name], MD   1 year ago Essential hypertension   [place], Deborah B, MD             . lisinopril (ZESTRIL) 40 MG tablet [Pharmacy Med Name: Lisinopril 40 MG Oral Tablet] 90 tablet 3    Sig: TAKE 1 TABLET BY MOUTH  DAILY     Cardiovascular:  ACE Inhibitors Failed - 10/07/2020  6:53 PM      Failed - K in normal range and within 180 days    Potassium  Date Value Ref Range Status  07/17/2020 3.4 (L) 3.5 - 5.2 mmol/L Final         Failed -  Last BP in normal range    BP Readings from Last 1 Encounters:  06/14/20 (!) 148/84         Passed - Cr in normal range and within 180 days    Creat  Date Value Ref Range Status  04/17/2016 0.77 0.50 - 1.05 mg/dL Final    Comment:      For patients > or = 64 years of age: The upper reference limit for Creatinine is approximately 13% higher for people identified as African-American.      Creatinine, Ser  Date Value Ref Range Status  06/14/2020 0.81 0.57 - 1.00 mg/dL Final         Passed - Patient is not pregnant      Passed - Valid encounter within last 6 months    Recent Outpatient Visits          3 months ago Essential hypertension   [place],  Dalbert Batman, MD   7 months ago Abscess of abdominal wall   [place] [name], MD   8 months ago Essential hypertension   [place] [name], MD   1 year ago Encounter for [employer] annual wellness exam   [place] [name], MD   1 year ago Essential hypertension   [place] [name], MD             . meloxicam (MOBIC) 15 MG tablet [Pharmacy Med Name: Meloxicam 15 MG Oral Tablet] 90 tablet 3    Sig: TAKE 1 TABLET BY MOUTH  DAILY     Analgesics:  COX2 Inhibitors Passed - 10/07/2020  6:53 PM      Passed - HGB in normal range and within 360 days    Hemoglobin  Date Value Ref Range Status  06/14/2020 12.7 11.1 - 15.9 g/dL Final   HGB  Date Value Ref Range Status  06/12/2008 13.8 11.6 - 15.9 g/dL Final         Passed - Cr in normal range and within 360 days    Creat  Date Value Ref Range Status  04/17/2016 0.77 0.50 - 1.05 mg/dL Final    Comment:      For patients > or = 64 years of age: The upper reference limit for Creatinine is approximately 13% higher for people identified as African-American.       Creatinine, Ser  Date Value Ref Range Status  06/14/2020 0.81 0.57 - 1.00 mg/dL Final         Passed - Patient is not pregnant      Passed - Valid encounter within last 12 months    Recent Outpatient Visits          3 months ago Essential hypertension   [place] Karle Plumber B, MD   7 months ago Abscess of abdominal wall   [place] [name], MD   8 months ago Essential hypertension   [place] [name], MD   1 year ago Encounter for Medicare annual wellness exam   [place] [name], MD   1 year ago Essential hypertension   [place], Deborah B, MD             . omeprazole (PRILOSEC) 40 MG capsule [Pharmacy Med Name: Omeprazole 40 MG Oral Capsule Delayed Release] 90 capsule 3    Sig: TAKE 1 CAPSULE BY MOUTH  DAILY     Gastroenterology: Proton Pump Inhibitors Passed - 10/07/2020  6:53 PM      Passed - Valid encounter within last 12 months    Recent Outpatient Visits          3 months ago Essential hypertension   [place] [name], MD   7 months ago Abscess of abdominal wall   [place] [name], MD   8 months ago Essential hypertension   [place] [name], MD   1 year ago Encounter for [employer] annual wellness exam   [place] [name], MD   1 year ago Essential hypertension   [place]  [name], MD             . gabapentin (NEURONTIN) 300 MG capsule 30 capsule     Sig: TAKE 1 CAPSULE (300 MG TOTAL) BY MOUTH AT BEDTIME.     Neurology: Anticonvulsants - gabapentin Passed - 10/07/2020  6:53 PM      Passed - Valid encounter within last 12 months    Recent  Outpatient Visits          3 months ago Essential hypertension   [place] [name], MD   7 months ago Abscess of abdominal wall   [place] [name], MD   8 months ago Essential hypertension   [place] [name], MD   1 year ago Encounter for [employer] annual wellness exam   [place] [name], MD   1 year ago Essential hypertension   [place] Karle Plumber B, MD             . amLODipine (NORVASC) 10 MG tablet 30 tablet     Sig: TAKE 1 TABLET (10 MG TOTAL) BY MOUTH AT BEDTIME.     Cardiovascular:  Calcium Channel Blockers Failed - 10/07/2020  6:53 PM      Failed - Last BP in normal range    BP Readings from Last 1 Encounters:  06/14/20 (!) 148/84         Passed - Valid encounter within last 6 months    Recent Outpatient Visits          3 months ago Essential hypertension   [place] Karle Plumber B, MD   7 months ago Abscess of abdominal wall   [place] [name], MD   8 months ago Essential hypertension   [place] [facility] [name], MD   1 year ago Encounter for [employer] annual wellness exam   [place] [name], MD   1 year ago Essential hypertension   [place] [name], MD

## 2020-10-07 NOTE — Telephone Encounter (Signed)
Requested Prescriptions  Pending Prescriptions Disp Refills  . gabapentin (NEURONTIN) 300 MG capsule 30 capsule 0    Sig: TAKE 1 CAPSULE (300 MG TOTAL) BY MOUTH AT BEDTIME.     Neurology: Anticonvulsants - gabapentin Passed - 10/07/2020  6:53 PM      Passed - Valid encounter within last 12 months    Recent Outpatient Visits          3 months ago Essential hypertension   [place] [name], MD   7 months ago Abscess of abdominal wall   [place] [name], MD   8 months ago Essential hypertension   [place] [name], MD   1 year ago Encounter for [employer] annual wellness exam   [place] [name], MD   1 year ago Essential hypertension   [place] Plumber B, MD             . amLODipine (NORVASC) 10 MG tablet 30 tablet 0    Sig: TAKE 1 TABLET (10 MG TOTAL) BY MOUTH AT BEDTIME.     Cardiovascular:  Calcium Channel Blockers Failed - 10/07/2020  6:53 PM      Failed - Last BP in normal range    BP Readings from Last 1 Encounters:  06/14/20 (!) 148/84         Passed - Valid encounter within last 6 months    Recent Outpatient Visits          3 months ago Essential hypertension   [place], Neoma Laming B, MD   7 months ago Abscess of abdominal wall   [place] [name], MD   8 months ago Essential hypertension   [place] [name], MD   1 year ago Encounter for Medicare annual wellness exam   [place] [name], MD   1 year ago Essential hypertension   [place], MD             Signed Prescriptions Disp Refills   atorvastatin (LIPITOR) 40 MG tablet 90 tablet 3     Sig: TAKE 1 TABLET BY MOUTH  DAILY     Cardiovascular:  Antilipid - Statins Passed - 10/07/2020  6:53 PM      Passed - Total Cholesterol in normal range and within 360 days    Cholesterol, Total  Date Value Ref Range Status  06/14/2020 157 100 - 199 mg/dL Final         Passed - LDL in normal range and within 360 days    LDL Chol Calc (NIH)  Date Value Ref Range Status  06/14/2020 77 0 - 99 mg/dL Final         Passed - HDL in normal range and within 360 days    HDL  Date Value Ref Range Status  06/14/2020 66 >39 mg/dL Final         Passed - Triglycerides in normal range and within 360 days    Triglycerides  Date Value Ref Range Status  06/14/2020 70 0 - 149 mg/dL Final         Passed - Patient is not pregnant      Passed - Valid encounter within last 12  months    Recent Outpatient Visits          3 months ago Essential hypertension   [place] Karle Plumber B, MD   7 months ago Abscess of abdominal wall   [place] [name], MD   8 months ago Essential hypertension   [place] [name], MD   1 year ago Encounter for Medicare annual wellness exam   [place] [name], MD   1 year ago Essential hypertension   [place] [name], MD              lisinopril (ZESTRIL) 40 MG tablet 90 tablet 3    Sig: TAKE 1 TABLET BY MOUTH  DAILY     Cardiovascular:  ACE Inhibitors Failed - 10/07/2020  6:53 PM      Failed - K in normal range and within 180 days    Potassium  Date Value Ref Range Status  07/17/2020 3.4 (L) 3.5 - 5.2 mmol/L Final         Failed - Last BP in normal range    BP Readings from Last 1 Encounters:  06/14/20 (!) 148/84         Passed - Cr in normal range and within 180 days    Creat  Date Value Ref Range Status  04/17/2016 0.77 0.50 - 1.05 mg/dL  Final    Comment:      For patients > or = 64 years of age: The upper reference limit for Creatinine is approximately 13% higher for people identified as African-American.      Creatinine, Ser  Date Value Ref Range Status  06/14/2020 0.81 0.57 - 1.00 mg/dL Final         Passed - Patient is not pregnant      Passed - Valid encounter within last 6 months    Recent Outpatient Visits          3 months ago Essential hypertension   [place] Karle Plumber B, MD   7 months ago Abscess of abdominal wall   [place] [name], MD   8 months ago Essential hypertension   [place] [name], MD   1 year ago Encounter for Medicare annual wellness exam   [place] [name], MD   1 year ago Essential hypertension   [place], MD              meloxicam (MOBIC) 15 MG tablet 90 tablet 3    Sig: TAKE 1 TABLET BY MOUTH  DAILY     Analgesics:  COX2 Inhibitors Passed - 10/07/2020  6:53 PM      Passed - HGB in normal range and within 360 days    Hemoglobin  Date Value Ref Range Status  06/14/2020 12.7 11.1 - 15.9 g/dL Final   HGB  Date Value Ref Range Status  06/12/2008 13.8 11.6 - 15.9 g/dL Final         Passed - Cr in normal range and within 360 days    Creat  Date Value Ref Range Status  04/17/2016 0.77 0.50 - 1.05 mg/dL Final    Comment:      For patients > or =  64 years of age: The upper reference limit for Creatinine is approximately 13% higher for people identified as African-American.      Creatinine, Ser  Date Value Ref Range Status  06/14/2020 0.81 0.57 - 1.00 mg/dL Final         Passed - Patient is not pregnant      Passed - Valid encounter within last 12 months    Recent Outpatient Visits          3 months ago Essential hypertension   [place] Karle Plumber B, MD   7 months ago Abscess of abdominal wall   [place] [name], MD   8 months ago Essential hypertension   [place] [name], MD   1 year ago Encounter for Medicare annual wellness exam   [place] [name], MD   1 year ago Essential hypertension   [place] [name], MD              omeprazole (PRILOSEC) 40 MG capsule 90 capsule 3    Sig: TAKE 1 CAPSULE BY MOUTH  DAILY     Gastroenterology: Proton Pump Inhibitors Passed - 10/07/2020  6:53 PM      Passed - Valid encounter within last 12 months    Recent Outpatient Visits          3 months ago Essential hypertension   [place] [name], MD   7 months ago Abscess of abdominal wall   [place] [name], MD   8 months ago Essential hypertension   [place] [name], MD   1 year ago Encounter for [employer] annual wellness exam   [place] [name], MD   1 year ago Essential hypertension   [place] [name], MD

## 2020-10-07 NOTE — Telephone Encounter (Signed)
Called pt to clarify which pharmacy she wants the prescrptions sent to. Pt stated to send to [facility] and  requests Gabapentin 300 mg and amlodipine 10 mg to be  refilled.

## 2020-10-17 ENCOUNTER — Other Ambulatory Visit: Payer: Self-pay

## 2020-10-18 ENCOUNTER — Other Ambulatory Visit: Payer: Self-pay

## 2020-10-19 ENCOUNTER — Other Ambulatory Visit: Payer: Self-pay

## 2020-10-23 ENCOUNTER — Other Ambulatory Visit: Payer: Self-pay

## 2020-10-23 ENCOUNTER — Encounter: Payer: Self-pay | Admitting: Internal Medicine

## 2020-10-23 ENCOUNTER — Ambulatory Visit: Payer: Medicare Other | Attending: Internal Medicine | Admitting: Internal Medicine

## 2020-10-23 VITALS — BP 132/81 | HR 63 | Resp 16 | Ht 64.5 in | Wt 190.6 lb

## 2020-10-23 DIAGNOSIS — I739 Peripheral vascular disease, unspecified: Secondary | ICD-10-CM | POA: Diagnosis not present

## 2020-10-23 DIAGNOSIS — Z2821 Immunization not carried out because of patient refusal: Secondary | ICD-10-CM | POA: Diagnosis not present

## 2020-10-23 DIAGNOSIS — Z0000 Encounter for general adult medical examination without abnormal findings: Secondary | ICD-10-CM

## 2020-10-23 DIAGNOSIS — Z122 Encounter for screening for malignant neoplasm of respiratory organs: Secondary | ICD-10-CM

## 2020-10-23 DIAGNOSIS — E669 Obesity, unspecified: Secondary | ICD-10-CM

## 2020-10-23 DIAGNOSIS — Z87891 Personal history of nicotine dependence: Secondary | ICD-10-CM | POA: Diagnosis not present

## 2020-10-23 DIAGNOSIS — I10 Essential (primary) hypertension: Secondary | ICD-10-CM

## 2020-10-23 DIAGNOSIS — Z6832 Body mass index (BMI) 32.0-32.9, adult: Secondary | ICD-10-CM

## 2020-10-23 MED ORDER — CARVEDILOL 6.25 MG PO TABS
ORAL_TABLET | Freq: Two times a day (BID) | ORAL | 1 refills | Status: DC
  Filled 2020-10-23: qty 180, 90d supply, fill #0
  Filled 2021-01-17: qty 180, 90d supply, fill #1

## 2020-10-23 MED ORDER — HYDRALAZINE HCL 25 MG PO TABS
ORAL_TABLET | ORAL | 1 refills | Status: DC
  Filled 2020-10-23: qty 180, 90d supply, fill #0
  Filled 2021-01-17: qty 180, 90d supply, fill #1

## 2020-10-23 NOTE — Progress Notes (Signed)
Subjective:   Brandy Stevenson is a 65 y.o. female who presents for Medicare Annual (Subsequent) preventive examination. Hx of PAD, HTN, HL, former tob dep, LT breast CA (lumpectomy and XRT 2009), GERD, COPD, Trigeminal neuralgia RT, dep/anxiety, OA shoulders  Review of Systems    CV: Patient with known PAD.  She feels the distance that she can walk before getting cramps in the calf has decreased.  Denies any rest pain in the legs.  No skin discoloration in the legs.  She confirms taking Pletal, aspirin and atorvastatin.  She would like to get back in with her vascular surgeon Dr. Scot Dock whom she saw in March of this year. Needing refill on a few of her blood pressure medications.  Blood pressure today is good.       Objective:    Today's Vitals   10/23/20 1011  BP: 132/81  Pulse: 63  Resp: 16  SpO2: 97%  Weight: 190 lb 9.6 oz (86.5 kg)  Height: 5' 4.5" (1.638 m)   Wt Readings from Last 3 Encounters:  10/23/20 190 lb 9.6 oz (86.5 kg)  06/14/20 187 lb 12.8 oz (85.2 kg)  05/16/20 188 lb 14.4 oz (85.7 kg)    Body mass index is 32.21 kg/m.  Constitutional: Appears well-developed and well-nourished. No distress. Eyes: Conjunctivae and EOM are normal. PERRLA, no scleral icterus.  Neck: Neck supple.  No tracheal deviation. No thyromegaly. No cervical LN CVS: RRR, S1/S2 +, no murmurs, no gallops, no carotid bruit. No JVD Pulmonary: Effort and breath sounds normal, no stridor, rhonchi, wheezes, rales.  Extremity: Legs and feet are warm.  Dorsalis pedis pulses 1+ bilaterally Psychiatric: Normal mood and affect. Behavior, judgment, thought content normal.     Advanced Directives 10/23/2020 10/05/2019 08/30/2019 08/30/2019 04/28/2019 02/16/2019 05/05/2018  Does Patient Have a Medical Advance Directive? No No No No No No No  Would patient like information on creating a medical advance directive? No - Patient declined No - Patient declined Yes (MAU/Ambulatory/Procedural Areas -  Information given) - No - Patient declined No - Patient declined No - Patient declined  Pre-existing out of facility DNR order (yellow form or pink MOST form) - - - - - - -  Still has package given on last [place].   Current Medications (verified) Outpatient Encounter Medications as of 10/23/2020  Medication Sig   albuterol (PROAIR HFA) 108 (90 Base) MCG/ACT inhaler Inhale 2 puffs into the lungs every 6 (six) hours as needed for wheezing or shortness of breath.   amLODipine (NORVASC) 10 MG tablet TAKE 1 TABLET (10 MG TOTAL) BY MOUTH AT BEDTIME.   aspirin EC 81 MG tablet Take 1 tablet (81 mg total) by mouth daily.   atorvastatin (LIPITOR) 40 MG tablet TAKE 1 TABLET BY MOUTH  DAILY   carvedilol (COREG) 6.25 MG tablet TAKE 1 TABLET (6.25 MG TOTAL) BY MOUTH 2 (TWO) TIMES DAILY.   cilostazol (PLETAL) 50 MG tablet TAKE 1 TABLET (50 MG TOTAL) BY MOUTH 2 (TWO) TIMES DAILY.   fluticasone-salmeterol (ADVAIR DISKUS) 100-50 MCG/ACT AEPB INHALE 1 PUFF INTO THE LUNGS 2 (TWO) TIMES DAILY.   gabapentin (NEURONTIN) 300 MG capsule TAKE 1 CAPSULE (300 MG TOTAL) BY MOUTH AT BEDTIME.   hydrALAZINE (APRESOLINE) 25 MG tablet TAKE 1 TABLET (25 MG TOTAL) BY MOUTH 2 (TWO) TIMES DAILY. MAY TAKE A TABLET IF BLOOD PRESSURE IS ABOVE 150/90 AFTER ONE HOUR RECHECK.   lisinopril (ZESTRIL) 40 MG tablet TAKE 1 TABLET BY MOUTH  DAILY  meloxicam (MOBIC) 15 MG tablet TAKE 1 TABLET BY MOUTH  DAILY   nystatin (MYCOSTATIN) 100000 UNIT/ML suspension TAKE 5 MLS (500,000 UNITS TOTAL) BY MOUTH 4 (FOUR) TIMES DAILY. SWISH AND SWALLOW   omeprazole (PRILOSEC) 40 MG capsule TAKE 1 CAPSULE BY MOUTH  DAILY   potassium chloride (KLOR-CON) 10 MEQ tablet Take 2 tablets (20 mEq total) by mouth daily.   spironolactone (ALDACTONE) 25 MG tablet Take 1 tablet (25 mg total) by mouth daily. OFFICE VISIT NEEDED FOR ADDITIONAL REFILLS   No facility-administered encounter medications on file as of 10/23/2020.    Allergies (verified) Vicodin  [hydrocodone-acetaminophen], Bactrim [sulfamethoxazole-trimethoprim], Incruse ellipta [umeclidinium bromide], and Wellbutrin [bupropion]   History: Past Medical History:  Diagnosis Date   Arthritis    back problems to be evaluated by neurology next month   Cancer ([place]) 2009   L, Breast, radiation and surgery.   COPD (chronic obstructive pulmonary disease) (HCC)    Depression    GERD (gastroesophageal reflux disease)    Hyperlipemia    Hypertension    Leriche syndrome (HCC)    Infrarenal aortic occlusion extending to bilateral internal iliac arteries.  External iliacs fill via collaterals from lumbar arteries to hypogastric/internal iliac arteries -minimal disease on the right side and no disease on the left side leg arteries distally.   Peripheral vascular disease ([place]) 2013   Past Surgical History:  Procedure Laterality Date   APPENDECTOMY  1980   BREAST LUMPECTOMY Left    s/p radiation therapy    BREAST SURGERY     COLONOSCOPY  07/2015   COLONOSCOPY WITH PROPOFOL N/A 06/26/2015   Procedure: COLONOSCOPY WITH PROPOFOL;  Surgeon: [name], MD;  Location: [facility];  Service: Endoscopy;  Laterality: N/A;   CT CTA CORONARY W/CA SCORE W/CM &/OR WO/CM  12/10/2017   Coronary calcium score is 0.  No evidence of CAD.   PERIPHERAL VASCULAR CATHETERIZATION N/A 10/22/2015   Procedure: Abdominal Aortogram w/Lower Extremity;  Surgeon: [name], MD;  Location: [facility] INVASIVE CV LAB: Aortic arch widely patent as far as innominate artery, right subclavian artery, right vertebral artery and right, common carotid.  Bilateral single renal arteries - Nonocclusive disease. Infrarenal aorta 100% - bilateral common Iliac 100% --> B Ex Iliacs fill via Int Iliac. Leg OK   TRANSTHORACIC ECHOCARDIOGRAM  08/2017   EF 60-65%. No RWMA.  Normal DF.  Normal valves.    TUBAL LIGATION  1991   Family History  Problem Relation Age of Onset   Hyperlipidemia Mother    Hypertension Mother    Heart  disease Mother        Atrial Fib.   Hyperlipidemia Sister    Hypertension Sister    Heart disease Sister        Before age 22,  CHF   Heart attack Brother    Hyperlipidemia Brother    Hypertension Brother    Heart disease Brother    Hypertension Daughter    Social History   Socioeconomic History   Marital status: Married    Spouse name: Not on file   Number of children: Not on file   Years of education: Not on file   Highest education level: Not on file  Occupational History   Not on file  Tobacco Use   Smoking status: Former    Packs/day: 0.25    Years: 40.00    Pack years: 10.00    Types: Cigarettes    Quit date: 02/08/2017    Years since  quitting: 3.7   Smokeless tobacco: Never  Vaping Use   Vaping Use: Never used  Substance and Sexual Activity   Alcohol use: No   Drug use: No   Sexual activity: Yes  Other Topics Concern   Not on file  Social History Narrative   Not on file   Social Determinants of Health   Financial Resource Strain: Not on file  Food Insecurity: Not on file  Transportation Needs: Not on file  Physical Activity: Not on file  Stress: Not on file  Social Connections: Not on file    Tobacco Counseling Does not smoke  Clinical Intake: Pain : No/denies pain  Diabetes: No Diabetic?no  Activities of Daily Living In your present state of health, do you have any difficulty performing the following activities: 10/23/2020  Hearing? N  Vision? N  Difficulty concentrating or making decisions? N  Walking or climbing stairs? Y  Dressing or bathing? N  Doing errands, shopping? N  Preparing Food and eating ? N  Using the Toilet? N  In the past six months, have you accidently leaked urine? N  Do you have problems with loss of bowel control? N  Managing your Medications? N  Managing your Finances? N  Housekeeping or managing your Housekeeping? N  Some recent data might be hidden  Has problems walking and climbing stairs due to PAD.  Not  doing too much walking because too many loose dogs in her neighborhood. Can walk less than 1/2 block before having to stop and rest due to cramps in legs. Taking Pletal and ASA.  Plans to call and get back in with Dr. Scot Dock  Patient Care Team: [name], MD as PCP - General (Internal Medicine) [name], MD as PCP - Cardiology (Cardiology) Deitra Mayo, MD - Vascular surgeon Katy Fitch, MD - ophthalmology  Indicate any recent Medical Services you may have received from other than [facility] providers in the past year (date may be approximate). None    Assessment:   This is a routine wellness examination for [place].  Hearing/Vision screen Vision Screening   Right eye Left eye Both eyes  Without correction     With correction '20 20 20 20 20 20  '$ Whisper test: Normal bilaterally  Dietary issues and exercise activities discussed: Continues to gain wgh.  Feels she is doing good.  Has cut back on portion sizes, not snacking on sweets as much.  Not exercising as much for reason stated above    Goals Addressed   None   Depression Screen PHQ 2/9 Scores 10/23/2020 06/14/2020 02/14/2020 02/03/2020 08/30/2019 07/08/2019 04/28/2019  PHQ - 2 Score '2 1 1 1 1 3 1  '$ PHQ- 9 Score 7 - - - 7 9 -   Reports depression not a major issue for her but "I have a lot going on."  Helping to take care of her mom who relocated her 1 yr ago.  Fall Risk Fall Risk  10/23/2020 06/14/2020 02/14/2020 02/03/2020 08/30/2019  Falls in the past year? 0 0 0 0 0  Number falls in past yr: 0 0 0 0 0  Injury with Fall? 0 0 0 0 0  Risk for fall due to : No Fall Risks - - - -    FALL RISK PREVENTION PERTAINING TO THE HOME:  Any stairs in or around the home? Yes  - 4 steps to entrance to house If so, are there any without handrails? Yes  Home free of loose throw rugs in  walkways, pet beds, electrical cords, etc? Yes  Adequate lighting in your home to reduce risk of falls? Yes   ASSISTIVE DEVICES UTILIZED TO  PREVENT FALLS:  Life alert? Yes  Use of a cane, walker or w/c? No  Grab bars in the bathroom? No  Shower chair or bench in shower? Yes  Elevated toilet seat or a handicapped toilet? Yes   TIMED UP AND GO:  Was the test performed? Yes .  Length of time to ambulate 10 feet: 9 sec.   Gait slow and steady without use of assistive device  Cognitive Function: MMSE - Mini Mental State Exam 10/23/2020  Orientation to time 5  Orientation to Place 5  Registration 3  Attention/ Calculation 5  Recall 3  Language- name 2 objects 2  Language- repeat 1  Language- follow 3 step command 3  Language- read & follow direction 1  Write a sentence 1  Copy design 0  Total score 29     6CIT Screen 08/30/2019  What Year? 0 points  What month? 0 points  What time? 0 points  Count back from 20 0 points  Months in reverse 0 points  Repeat phrase 0 points  Total Score 0    Immunizations  There is no immunization history on file for this patient.  TDAP status: Due, Education has been provided regarding the importance of this vaccine. Advised may receive this vaccine at local pharmacy or Health Dept. Aware to provide a copy of the vaccination record if obtained from local pharmacy or Health Dept. Verbalized acceptance and understanding.  Flu Vaccine status: Declined, Education has been provided regarding the importance of this vaccine but patient still declined. Advised may receive this vaccine at local pharmacy or Health Dept. Aware to provide a copy of the vaccination record if obtained from local pharmacy or Health Dept. Verbalized acceptance and understanding.  Pneumococcal vaccine status: Declined,  Education has been provided regarding the importance of this vaccine but patient still declined. Advised may receive this vaccine at local pharmacy or Health Dept. Aware to provide a copy of the vaccination record if obtained from local pharmacy or Health Dept. Verbalized acceptance and  understanding.   Covid-19 vaccine status: Declined, Education has been provided regarding the importance of this vaccine but patient still declined. Advised may receive this vaccine at local pharmacy or Health Dept.or vaccine clinic. Aware to provide a copy of the vaccination record if obtained from local pharmacy or Health Dept. Verbalized acceptance and understanding.  Qualifies for Shingles Vaccine? Yes   Zostavax completed No   Shingrix Completed?: No.    Education has been provided regarding the importance of this vaccine. Patient has been advised to call insurance company to determine out of pocket expense if they have not yet received this vaccine. Advised may also receive vaccine at local pharmacy or Health Dept. Verbalized acceptance and understanding.  Screening Tests Health Maintenance  Topic Date Due   COVID-19 Vaccine (1) Never done   Pneumococcal Vaccine 33-27 Years old (1 - PCV) Never done   Zoster Vaccines- Shingrix (1 of 2) Never done   INFLUENZA VACCINE  10/01/2020   MAMMOGRAM  08/20/2021   PAP SMEAR-Modifier  01/20/2022   COLONOSCOPY (Pts 45-82yr Insurance coverage will need to be confirmed)  04/11/2022   TETANUS/TDAP  04/09/2023   Hepatitis C Screening  Completed   HIV Screening  Completed   HPV VACCINES  Aged Out    Health Maintenance  Health Maintenance Due  Topic  Date Due   COVID-19 Vaccine (1) Never done   Pneumococcal Vaccine 43-9 Years old (1 - PCV) Never done   Zoster Vaccines- Shingrix (1 of 2) Never done   INFLUENZA VACCINE  10/01/2020    Colorectal cancer screening: Type of screening: Colonoscopy. Completed 04/12/19. Repeat every 3 years  Mammogram status: Completed 08/2020. Repeat every year  Lung Cancer Screening: (Low Dose CT Chest recommended if Age 108-80 years, 30 pack-year currently smoking OR have quit w/in 15years.) does qualify.   Lung Cancer Screening Referral: LDCT ordered  Additional Screening:  Hepatitis C Screening: does qualify;  Completed 03/15/2015  Vision Screening: Recommended annual ophthalmology exams for early detection of glaucoma and other disorders of the eye. Is the patient up to date with their annual eye exam?  Yes  Who is the provider or what is the name of the office in which the patient attends annual eye exams? Dr. Schuyler Amor If pt is not established with a provider, would they like to be referred to a provider to establish care?  NA .   Dental Screening: Recommended annual dental exams for proper oral hygiene - Dr. Nyoka Cowden.  Has dentures full  Community Resource Referral / Chronic Care Management: CRR required this visit?  No   CCM required this visit?  No      Plan:  1. Encounter for Medicare annual wellness exam   2. PAD (peripheral artery disease) ([place]) -Patient feels that her claudication symptoms have worsened since her last visit with Dr. Scot Dock in March of this year and despite being on aspirin and Pletal.  She may benefit from a graded exercise program.  However she would like to follow-up with her vascular surgeon first.  She will call his office sometime this week to schedule an appointment.  3. Obesity (BMI 30.0-34.9) Commended her on changes made in her eating habits so far.  Encouraged her to incorporate fresh fruits and vegetables into the diet daily, eliminate sugary drinks from the diet, cut back on portion sizes of white carbohydrates and to eat more lean white meat instead of red meat.  She is agreeable to referral to see the nutritionist. - Amb ref to Medical Nutrition Therapy-MNT  4. Essential hypertension Close to goal.  Refills given on carvedilol and hydralazine - carvedilol (COREG) 6.25 MG tablet; TAKE 1 TABLET (6.25 MG TOTAL) BY MOUTH 2 (TWO) TIMES DAILY.  Dispense: 180 tablet; Refill: 1 - hydrALAZINE (APRESOLINE) 25 MG tablet; TAKE 1 TABLET (25 MG TOTAL) BY MOUTH 2 (TWO) TIMES DAILY. MAY TAKE A TABLET IF BLOOD PRESSURE IS ABOVE 150/90 AFTER ONE HOUR RECHECK.  Dispense: 180  tablet; Refill: 1  5. Screening for lung cancer Patient meets criteria for screening.  She has a 30-pack-year smoking history and quit just a few years ago.  She is agreeable to screening. - CT CHEST LUNG CA SCREEN LOW DOSE W/O CM; Future  6. Former smoker - CT CHEST LUNG CA SCREEN LOW DOSE W/O CM; Future  7. COVID-19 vaccination declined Strongly recommend getting the COVID-19 vaccine series.  Patient declined.  8. 23-polyvalent pneumococcal polysaccharide vaccine declined   9. Influenza vaccination declined   I have personally reviewed and noted the following in the patient's chart:   Medical and social history Use of alcohol, tobacco or illicit drugs  Current medications and supplements including opioid prescriptions.  Functional ability and status Nutritional status Physical activity Advanced directives List of other physicians Hospitalizations, surgeries, and ER visits in previous 12 months Vitals Screenings  to include cognitive, depression, and falls Referrals and appointments  In addition, I have reviewed and discussed with patient certain preventive protocols, quality metrics, and best practice recommendations. A written personalized care plan for preventive services as well as general preventive health recommendations were provided to patient.     Karle Plumber, MD   10/23/2020

## 2020-10-27 ENCOUNTER — Other Ambulatory Visit: Payer: Self-pay | Admitting: Internal Medicine

## 2020-10-27 DIAGNOSIS — I70219 Atherosclerosis of native arteries of extremities with intermittent claudication, unspecified extremity: Secondary | ICD-10-CM

## 2020-10-27 NOTE — Telephone Encounter (Signed)
Requested Prescriptions  Pending Prescriptions Disp Refills  . amLODipine (NORVASC) 10 MG tablet [Pharmacy Med Name: amLODIPine Besylate 10 MG Oral Tablet] 90 tablet 1    Sig: TAKE 1 TABLET BY MOUTH AT  BEDTIME     Cardiovascular:  Calcium Channel Blockers Passed - 10/27/2020  7:32 AM      Passed - Last BP in normal range    BP Readings from Last 1 Encounters:  10/23/20 132/81         Passed - Valid encounter within last 6 months    Recent Outpatient Visits          4 days ago Encounter for [employer] annual wellness exam   [place] [name], MD   4 months ago Essential hypertension   [place], MD   8 months ago Abscess of abdominal wall   [place], MD   8 months ago Essential hypertension   [place] [name], MD   1 year ago Encounter for [employer] annual wellness exam   [place], MD      Future Appointments            In 3 months Wynetta Emery, Dalbert Batman, MD [place]           . gabapentin (NEURONTIN) 300 MG capsule [Pharmacy Med Name: Gabapentin 300 MG Oral Capsule] 90 capsule 3    Sig: TAKE 1 CAPSULE BY MOUTH AT  BEDTIME     Neurology: Anticonvulsants - gabapentin Passed - 10/27/2020  7:32 AM      Passed - Valid encounter within last 12 months    Recent Outpatient Visits          4 days ago Encounter for [employer] annual wellness exam   [place] [name], MD   4 months ago Essential hypertension   [place], MD   8 months ago Abscess of abdominal wall   [place], MD   8 months ago Essential hypertension   [place] [name], MD   1 year ago Encounter for [employer] annual wellness exam   [place], MD      Future Appointments            In 3 months Wynetta Emery Dalbert Batman, MD [place]

## 2020-11-07 ENCOUNTER — Encounter (HOSPITAL_COMMUNITY): Payer: Self-pay

## 2020-11-07 ENCOUNTER — Ambulatory Visit (HOSPITAL_COMMUNITY): Payer: Medicare Other

## 2020-11-11 ENCOUNTER — Telehealth: Payer: Self-pay

## 2020-11-11 NOTE — Telephone Encounter (Signed)
Was going to call pt to get AWV scheduled. But patient had hers 8/23

## 2020-11-22 ENCOUNTER — Other Ambulatory Visit: Payer: Self-pay

## 2020-11-22 ENCOUNTER — Other Ambulatory Visit: Payer: Self-pay | Admitting: Internal Medicine

## 2020-11-22 NOTE — Telephone Encounter (Signed)
Requested medication (s) are due for refill today: Yes  Requested medication (s) are on the active medication list: Yes  Last refill:  08/15/20  Future visit scheduled: Yes  Notes to clinic:  See request.    Requested Prescriptions  Pending Prescriptions Disp Refills   nystatin (MYCOSTATIN) 100000 UNIT/ML suspension 100 mL 0    Sig: TAKE 5 MLS (500,000 UNITS TOTAL) BY MOUTH 4 (FOUR) TIMES DAILY. SWISH AND SWALLOW     Off-Protocol Failed - 11/22/2020  9:39 AM      Failed - Medication not assigned to a protocol, review manually.      Passed - Valid encounter within last 12 months    Recent Outpatient Visits           1 month ago Encounter for [employer] annual wellness exam   [place] [name], MD   5 months ago Essential hypertension   [place], MD   9 months ago Abscess of abdominal wall   [place], MD   9 months ago Essential hypertension   [place], Deborah B, MD   1 year ago Encounter for [employer] annual wellness exam   [place], MD       Future Appointments             In 2 months Wynetta Emery Dalbert Batman, MD [place]

## 2020-11-23 ENCOUNTER — Other Ambulatory Visit: Payer: Self-pay

## 2020-11-23 MED ORDER — NYSTATIN 100000 UNIT/ML MT SUSP
OROMUCOSAL | 0 refills | Status: DC
  Filled 2020-11-23: qty 100, 5d supply, fill #0

## 2020-12-03 ENCOUNTER — Encounter: Payer: Medicare Other | Attending: Internal Medicine | Admitting: Registered"

## 2020-12-03 ENCOUNTER — Other Ambulatory Visit: Payer: Self-pay

## 2020-12-03 ENCOUNTER — Other Ambulatory Visit: Payer: Self-pay | Admitting: Internal Medicine

## 2020-12-03 ENCOUNTER — Telehealth: Payer: Self-pay

## 2020-12-03 DIAGNOSIS — J449 Chronic obstructive pulmonary disease, unspecified: Secondary | ICD-10-CM

## 2020-12-03 DIAGNOSIS — M7989 Other specified soft tissue disorders: Secondary | ICD-10-CM

## 2020-12-03 NOTE — Telephone Encounter (Signed)
Patient is a vascular patient of Dr. Nicole Cella with a history of claudication. She calls today to report a dull, aching heavy pain in her legs that gets worse late in the day. There is swelling which is helped by elevation. Advised the patient that her problem did not sound arterial, but venous. She says that her left is slightly worse than her right. Placed her on schedule as a new varicose vein with a LLE reflux study in November. Instructed patient in the meantime to elevate her legs and avoid prolonged sitting and standing. Patient verbalizes understanding.

## 2020-12-04 ENCOUNTER — Other Ambulatory Visit: Payer: Self-pay

## 2020-12-04 NOTE — Telephone Encounter (Signed)
Requested medication (s) are due for refill today: Yes  Requested medication (s) are on the active medication list: Yes  Last refill:    Future visit scheduled: Yes  Notes to clinic:  Pt. Requesting 90 day supply.    Requested Prescriptions  Pending Prescriptions Disp Refills   albuterol (PROAIR HFA) 108 (90 Base) MCG/ACT inhaler 8.5 g 2    Sig: Inhale 2 puffs into the lungs every 6 (six) hours as needed for wheezing or shortness of breath.     Pulmonology:  Beta Agonists Failed - 12/03/2020 10:29 PM      Failed - One inhaler should last at least one month. If the patient is requesting refills earlier, contact the patient to check for uncontrolled symptoms.      Passed - Valid encounter within last 12 months    Recent Outpatient Visits           1 month ago Encounter for [employer] annual wellness exam   [place] [name], MD   5 months ago Essential hypertension   [place] [name], MD   9 months ago Abscess of abdominal wall   [place], MD   10 months ago Essential hypertension   [place] [name], MD   1 year ago Encounter for [employer] annual wellness exam   [place], MD       Future Appointments             In 2 months [name], MD [place]             fluticasone-salmeterol (ADVAIR DISKUS) 100-50 MCG/ACT AEPB 60 each 0    Sig: INHALE 1 PUFF INTO THE LUNGS 2 (TWO) TIMES DAILY.     Pulmonology:  [employer] Passed - 12/03/2020 10:29 PM      Passed - Valid encounter within last 12 months    Recent Outpatient Visits           1 month ago Encounter for [employer] annual wellness exam   [place] [name], MD   5 months ago Essential  hypertension   [place], MD   9 months ago Abscess of abdominal wall   [place], MD   10 months ago Essential hypertension   [place], Deborah B, MD   1 year ago Encounter for [employer] annual wellness exam   [place], MD       Future Appointments             In 2 months Wynetta Emery Dalbert Batman, MD [place]

## 2020-12-05 ENCOUNTER — Other Ambulatory Visit: Payer: Self-pay

## 2020-12-05 ENCOUNTER — Other Ambulatory Visit: Payer: Self-pay | Admitting: Internal Medicine

## 2020-12-05 DIAGNOSIS — J449 Chronic obstructive pulmonary disease, unspecified: Secondary | ICD-10-CM

## 2020-12-05 MED ORDER — FLUTICASONE-SALMETEROL 100-50 MCG/ACT IN AEPB
1.0000 | INHALATION_SPRAY | Freq: Two times a day (BID) | RESPIRATORY_TRACT | 2 refills | Status: DC
  Filled 2020-12-05: qty 180, 90d supply, fill #0

## 2020-12-05 MED ORDER — ALBUTEROL SULFATE HFA 108 (90 BASE) MCG/ACT IN AERS
2.0000 | INHALATION_SPRAY | Freq: Four times a day (QID) | RESPIRATORY_TRACT | 2 refills | Status: DC | PRN
  Filled 2020-12-05: qty 25.5, 75d supply, fill #0

## 2020-12-05 NOTE — Telephone Encounter (Signed)
Requested medication (s) are due for refill today:   Yes for  both  Requested medication (s) are on the active medication list:   Yes  Future visit scheduled:   Yes   Last ordered: albuterol 07/17/2020 8.5 g, 2 refills;    Advair 10/04/2020-11/03/2020  #60, 0   Returned because pt is requesting a 90 day supply   Requested Prescriptions  Pending Prescriptions Disp Refills   albuterol (PROAIR HFA) 108 (90 Base) MCG/ACT inhaler 8.5 g 2    Sig: Inhale 2 puffs into the lungs every 6 (six) hours as needed for wheezing or shortness of breath.     Pulmonology:  Beta Agonists Failed - 12/05/2020  2:53 PM      Failed - One inhaler should last at least one month. If the patient is requesting refills earlier, contact the patient to check for uncontrolled symptoms.      Passed - Valid encounter within last 12 months    Recent Outpatient Visits           1 month ago Encounter for [employer] annual wellness exam   [place] [name], MD   5 months ago Essential hypertension   [place] [name], MD   9 months ago Abscess of abdominal wall   [place], MD   10 months ago Essential hypertension   [place] [name], MD   1 year ago Encounter for [employer] annual wellness exam   [place], MD       Future Appointments             In 2 months [name], MD [place]             fluticasone-salmeterol (ADVAIR DISKUS) 100-50 MCG/ACT AEPB 60 each 0    Sig: INHALE 1 PUFF INTO THE LUNGS 2 (TWO) TIMES DAILY.     Pulmonology:  Combination Products Passed - 12/05/2020  2:53 PM      Passed - Valid encounter within last 12 months    Recent Outpatient Visits           1 month ago Encounter for [employer] annual wellness exam   [place] [name], MD   5 months ago Essential hypertension   [place], MD   9 months ago Abscess of abdominal wall   [place], MD   10 months ago Essential hypertension   [place], Deborah B, MD   1 year ago Encounter for [employer] annual wellness exam   [place], MD       Future Appointments             In 2 months Wynetta Emery Dalbert Batman, MD [place]

## 2020-12-06 ENCOUNTER — Other Ambulatory Visit: Payer: Self-pay

## 2020-12-18 ENCOUNTER — Other Ambulatory Visit: Payer: Self-pay

## 2021-01-01 ENCOUNTER — Other Ambulatory Visit: Payer: Self-pay | Admitting: Internal Medicine

## 2021-01-01 DIAGNOSIS — I10 Essential (primary) hypertension: Secondary | ICD-10-CM

## 2021-01-01 NOTE — Telephone Encounter (Signed)
Requested medication (s) are due for refill today:   Yes for Aldactone, not sure for Nystatin  Requested medication (s) are on the active medication list:   Yes for both  Future visit scheduled:   No  She was a No Show on 12/03/2020.   There is a note needs OV for refills.   Last ordered: Aldactone 10/02/2020 #90, 0 refills,    Nystatin 11/23/2020 - 11/23/2021  100 ml, 0 refills - No protocol for this medication.  Returned for provider to review since she was a no show for her last appt.     Requested Prescriptions  Pending Prescriptions Disp Refills   spironolactone (ALDACTONE) 25 MG tablet 90 tablet 0    Sig: Take 1 tablet (25 mg total) by mouth daily. OFFICE VISIT NEEDED FOR ADDITIONAL REFILLS     Cardiovascular: Diuretics - Aldosterone Antagonist Failed - 01/01/2021 12:53 PM      Failed - K in normal range and within 360 days    Potassium  Date Value Ref Range Status  07/17/2020 3.4 (L) 3.5 - 5.2 mmol/L Final          Passed - Cr in normal range and within 360 days    Creat  Date Value Ref Range Status  04/17/2016 0.77 0.50 - 1.05 mg/dL Final    Comment:      For patients > or = 64 years of age: The upper reference limit for Creatinine is approximately 13% higher for people identified as African-American.      Creatinine, Ser  Date Value Ref Range Status  06/14/2020 0.81 0.57 - 1.00 mg/dL Final          Passed - Na in normal range and within 360 days    Sodium  Date Value Ref Range Status  06/14/2020 143 134 - 144 mmol/L Final          Passed - Last BP in normal range    BP Readings from Last 1 Encounters:  10/23/20 132/81          Passed - Valid encounter within last 6 months    Recent Outpatient Visits           2 months ago Encounter for [employer] annual wellness exam   [place] [name], MD   6 months ago Essential hypertension   [place] [name], MD   10 months ago  Abscess of abdominal wall   [place] [name], MD   11 months ago Essential hypertension   [place] [name], MD   1 year ago Encounter for [employer] annual wellness exam   [place], MD       Future Appointments             In 1 month [name], MD [place]             nystatin (MYCOSTATIN) 100000 UNIT/ML suspension 100 mL 0    Sig: TAKE 5 MLS (500,000 UNITS TOTAL) BY MOUTH 4 (FOUR) TIMES DAILY. SWISH AND SWALLOW     Off-Protocol Failed - 01/01/2021 12:53 PM      Failed - Medication not assigned to a protocol, review manually.      Passed - Valid encounter within last 12 months    Recent Outpatient Visits  2 months ago Encounter for [employer] annual wellness exam   [place] [name], MD   6 months ago Essential hypertension   [place], MD   10 months ago Abscess of abdominal wall   [place], MD   11 months ago Essential hypertension   [place], Deborah B, MD   1 year ago Encounter for [employer] annual wellness exam   [place], MD       Future Appointments             In 1 month Wynetta Emery Dalbert Batman, MD [place]

## 2021-01-02 ENCOUNTER — Other Ambulatory Visit: Payer: Self-pay

## 2021-01-02 MED ORDER — SPIRONOLACTONE 25 MG PO TABS
25.0000 mg | ORAL_TABLET | Freq: Every day | ORAL | 0 refills | Status: DC
  Filled 2021-01-02: qty 90, 90d supply, fill #0

## 2021-01-02 MED ORDER — NYSTATIN 100000 UNIT/ML MT SUSP
OROMUCOSAL | 0 refills | Status: DC
Start: 2021-01-02 — End: 2021-02-08
  Filled 2021-01-02: qty 100, 5d supply, fill #0

## 2021-01-03 ENCOUNTER — Other Ambulatory Visit: Payer: Self-pay

## 2021-01-16 ENCOUNTER — Ambulatory Visit (INDEPENDENT_AMBULATORY_CARE_PROVIDER_SITE_OTHER): Payer: Medicare Other | Admitting: Vascular Surgery

## 2021-01-16 ENCOUNTER — Ambulatory Visit (HOSPITAL_COMMUNITY)
Admission: RE | Admit: 2021-01-16 | Discharge: 2021-01-16 | Disposition: A | Discharge status: Home / Self Care | Payer: Medicare Other | Source: Ambulatory Visit | Attending: Vascular Surgery | Admitting: Vascular Surgery

## 2021-01-16 ENCOUNTER — Other Ambulatory Visit: Payer: Self-pay

## 2021-01-16 ENCOUNTER — Encounter: Payer: Self-pay | Admitting: Vascular Surgery

## 2021-01-16 VITALS — BP 146/82 | HR 66 | Temp 97.9°F | Resp 18 | Ht 64.0 in | Wt 189.8 lb

## 2021-01-16 DIAGNOSIS — M7989 Other specified soft tissue disorders: Secondary | ICD-10-CM

## 2021-01-16 NOTE — Progress Notes (Signed)
REASON FOR VISIT:   Leg swelling bilaterally.  MEDICAL ISSUES:   BILATERAL LOWER EXTREMITY SWELLING: This patient has mild swelling but her duplex scan today shows no evidence of DVT, deep venous reflux, or superficial venous reflux on the left where her symptoms are more significant.  We have discussed the importance of intermittent leg elevation and the proper positioning for this.  I encouraged her to stay as active as possible.  I have not encouraged her to avoid prolonged sitting and standing.  Her claudication symptoms are stable and she is due to see me back in about 1 year.  She knows to call sooner if she has problems.  However I reassured her that I was not concerned about her leg swelling which at this point is fairly minimal.   HPI:   Brandy Stevenson is a pleasant 65 y.o. female who I have seen in the past with peripheral vascular disease.  I last saw her in March of this year.  She had stable claudication of both lower extremities with no rest pain or nonhealing ulcers.  She was very motivated to exercise however her activity was somewhat limited as she did not feel safe in her neighborhood.  When I saw her last I recommended a follow-up visit with ABIs in 18 months.  She comes in today with a chief complaint of bilateral lower extremity swelling.  This is more significant on the left side.  She has no previous history of DVT.  She is had no previous venous procedures.  She has stable claudication of both calves.  She denies any history of rest pain.  She tells me she is able to elevate her legs without pain in her feet.  She has no history of nonhealing ulcers.  Past Medical History:  Diagnosis Date   Arthritis    back problems to be evaluated by neurology next month   Cancer [facility]) 2009   L, Breast, radiation and surgery.   COPD (chronic obstructive pulmonary disease) (HCC)    Depression    GERD (gastroesophageal reflux disease)    Hyperlipemia    Hypertension     Leriche syndrome (HCC)    Infrarenal aortic occlusion extending to bilateral internal iliac arteries.  External iliacs fill via collaterals from lumbar arteries to hypogastric/internal iliac arteries -minimal disease on the right side and no disease on the left side leg arteries distally.   Peripheral vascular disease ([place]) 2013    Family History  Problem Relation Age of Onset   Hyperlipidemia Mother    Hypertension Mother    Heart disease Mother        Atrial Fib.   Hyperlipidemia Sister    Hypertension Sister    Heart disease Sister        Before age 36,  CHF   Heart attack Brother    Hyperlipidemia Brother    Hypertension Brother    Heart disease Brother    Hypertension Daughter     SOCIAL HISTORY: Social History   Tobacco Use   Smoking status: Former    Packs/day: 0.25    Years: 40.00    Pack years: 10.00    Types: Cigarettes    Quit date: 02/08/2017    Years since quitting: 3.9   Smokeless tobacco: Never  Substance Use Topics   Alcohol use: No    Allergies  Allergen Reactions   Vicodin [Hydrocodone-Acetaminophen] Nausea And Vomiting and Other (See Comments)    Sweating   Bactrim [Sulfamethoxazole-Trimethoprim]  HA and vomiting   Incruse Ellipta [Umeclidinium Bromide]     Dry mouth   Wellbutrin [Bupropion]     Made me feel bad    Current Outpatient Medications  Medication Sig Dispense Refill   albuterol (PROAIR HFA) 108 (90 Base) MCG/ACT inhaler Inhale 2 puffs into the lungs every 6 (six) hours as needed for wheezing or shortness of breath. 8.5 g 2   amLODipine (NORVASC) 10 MG tablet TAKE 1 TABLET BY MOUTH AT  BEDTIME 90 tablet 1   aspirin EC 81 MG tablet Take 1 tablet (81 mg total) by mouth daily. 90 tablet 3   atorvastatin (LIPITOR) 40 MG tablet TAKE 1 TABLET BY MOUTH  DAILY 90 tablet 3   carvedilol (COREG) 6.25 MG tablet TAKE 1 TABLET (6.25 MG TOTAL) BY MOUTH 2 (TWO) TIMES DAILY. 180 tablet 1   cilostazol (PLETAL) 50 MG tablet TAKE 1 TABLET (50 MG  TOTAL) BY MOUTH 2 (TWO) TIMES DAILY. 60 tablet 6   fluticasone-salmeterol (ADVAIR DISKUS) 100-50 MCG/ACT AEPB Inhale 1 puff into the lungs 2 (two) times daily. 60 each 2   gabapentin (NEURONTIN) 300 MG capsule TAKE 1 CAPSULE BY MOUTH AT  BEDTIME 90 capsule 3   hydrALAZINE (APRESOLINE) 25 MG tablet TAKE 1 TABLET (25 MG TOTAL) BY MOUTH 2 (TWO) TIMES DAILY. MAY TAKE A TABLET IF BLOOD PRESSURE IS ABOVE 150/90 AFTER ONE HOUR RECHECK. 180 tablet 1   lisinopril (ZESTRIL) 40 MG tablet TAKE 1 TABLET BY MOUTH  DAILY 90 tablet 3   meloxicam (MOBIC) 15 MG tablet TAKE 1 TABLET BY MOUTH  DAILY 90 tablet 3   nystatin (MYCOSTATIN) 100000 UNIT/ML suspension TAKE 5 MLS (500,000 UNITS TOTAL) BY MOUTH 4 (FOUR) TIMES DAILY. SWISH AND SWALLOW 100 mL 0   omeprazole (PRILOSEC) 40 MG capsule TAKE 1 CAPSULE BY MOUTH  DAILY 90 capsule 3   potassium chloride (KLOR-CON) 10 MEQ tablet Take 2 tablets (20 mEq total) by mouth daily. 60 tablet 4   spironolactone (ALDACTONE) 25 MG tablet Take 1 tablet (25 mg total) by mouth daily. OFFICE VISIT NEEDED FOR ADDITIONAL REFILLS 90 tablet 0   No current facility-administered medications for this visit.    REVIEW OF SYSTEMS:  [X]  denotes positive finding, [ ]  denotes negative finding Cardiac  Comments:  Chest pain or chest pressure:    Shortness of breath upon exertion:    Short of breath when lying flat:    Irregular heart rhythm:        Vascular    Pain in calf, thigh, or hip brought on by ambulation: x   Pain in feet at night that wakes you up from your sleep:     Blood clot in your veins:    Leg swelling:  x       Pulmonary    Oxygen at home:    Productive cough:     Wheezing:         Neurologic    Sudden weakness in arms or legs:     Sudden numbness in arms or legs:     Sudden onset of difficulty speaking or slurred speech:    Temporary loss of vision in one eye:     Problems with dizziness:         Gastrointestinal    Blood in stool:     Vomited blood:          Genitourinary    Burning when urinating:     Blood in urine:  Psychiatric    Major depression:         Hematologic    Bleeding problems:    Problems with blood clotting too easily:        Skin    Rashes or ulcers:        Constitutional    Fever or chills:     PHYSICAL EXAM:   Vitals:   01/16/21 1427  BP: (!) 146/82  Pulse: 66  Resp: 18  Temp: 97.9 F (36.6 C)  TempSrc: Temporal  SpO2: 99%  Weight: 189 lb 12.8 oz (86.1 kg)  Height: 5\' 4"  (1.626 m)    GENERAL: The patient is a well-nourished female, in no acute distress. The vital signs are documented above. CARDIAC: There is a regular rate and rhythm.  VASCULAR: I do not detect carotid bruits. I cannot palpate femoral pulses or pedal pulses.  However both feet are warm and well-perfused.  She has mild bilateral lower extremity swelling. PULMONARY: There is good air exchange bilaterally without wheezing or rales. ABDOMEN: Soft and non-tender with normal pitched bowel sounds.  MUSCULOSKELETAL: There are no major deformities or cyanosis. NEUROLOGIC: No focal weakness or paresthesias are detected. SKIN: There are no ulcers or rashes noted. PSYCHIATRIC: The patient has a normal affect.  DATA:    VENOUS DUPLEX: I have independently interpreted the venous duplex scan today.  This was of the left lower extremity only.  There is no evidence of DVT.  There was no deep venous reflux.  There was no significant superficial venous reflux.  ARTERIAL DOPPLER STUDY: I reviewed her arterial Doppler study was done back in March of this year.  She had monophasic Doppler signals in both feet.  On the right side she had an ABI of 59%.  On the left side she had an ABI of 66%.  Deitra Mayo [facility] [phone]

## 2021-01-17 ENCOUNTER — Other Ambulatory Visit: Payer: Self-pay

## 2021-01-21 ENCOUNTER — Encounter: Payer: Medicare Other | Attending: Internal Medicine | Admitting: Registered"

## 2021-01-22 ENCOUNTER — Other Ambulatory Visit: Payer: Self-pay

## 2021-02-04 ENCOUNTER — Other Ambulatory Visit: Payer: Self-pay | Admitting: Internal Medicine

## 2021-02-04 DIAGNOSIS — I739 Peripheral vascular disease, unspecified: Secondary | ICD-10-CM

## 2021-02-04 NOTE — Telephone Encounter (Signed)
Requested medications are due for refill today.  yes  Requested medications are on the active medications list.  yes  Last refill. 06/14/2020  Future visit scheduled.   yes  Notes to clinic.  Failed protocol d/t expired labs.

## 2021-02-08 ENCOUNTER — Other Ambulatory Visit: Payer: Self-pay | Admitting: Internal Medicine

## 2021-02-08 ENCOUNTER — Other Ambulatory Visit: Payer: Self-pay

## 2021-02-08 MED ORDER — NYSTATIN 100000 UNIT/ML MT SUSP
OROMUCOSAL | 0 refills | Status: DC
  Filled 2021-02-08: qty 100, 5d supply, fill #0

## 2021-02-11 ENCOUNTER — Other Ambulatory Visit: Payer: Self-pay

## 2021-02-12 ENCOUNTER — Other Ambulatory Visit: Payer: Self-pay | Admitting: Internal Medicine

## 2021-02-12 ENCOUNTER — Other Ambulatory Visit: Payer: Self-pay

## 2021-02-12 ENCOUNTER — Encounter: Payer: Self-pay | Admitting: Internal Medicine

## 2021-02-12 ENCOUNTER — Ambulatory Visit: Payer: Medicare Other | Attending: Internal Medicine | Admitting: Internal Medicine

## 2021-02-12 VITALS — BP 146/72 | HR 67 | Ht 65.0 in | Wt 190.0 lb

## 2021-02-12 DIAGNOSIS — J449 Chronic obstructive pulmonary disease, unspecified: Secondary | ICD-10-CM | POA: Diagnosis not present

## 2021-02-12 DIAGNOSIS — I10 Essential (primary) hypertension: Secondary | ICD-10-CM | POA: Diagnosis not present

## 2021-02-12 DIAGNOSIS — E669 Obesity, unspecified: Secondary | ICD-10-CM | POA: Diagnosis not present

## 2021-02-12 DIAGNOSIS — Z2821 Immunization not carried out because of patient refusal: Secondary | ICD-10-CM

## 2021-02-12 DIAGNOSIS — L853 Xerosis cutis: Secondary | ICD-10-CM | POA: Diagnosis not present

## 2021-02-12 MED ORDER — ZOSTER VAC RECOMB ADJUVANTED 50 MCG/0.5ML IM SUSR: 0.5000 mL | Freq: Once | INTRAMUSCULAR | 1 refills | Status: DC

## 2021-02-12 MED ORDER — FLUTICASONE-SALMETEROL 250-50 MCG/ACT IN AEPB
1.0000 | INHALATION_SPRAY | Freq: Two times a day (BID) | RESPIRATORY_TRACT | 6 refills | Status: DC
Start: 2021-02-12 — End: 2021-06-14
  Filled 2021-02-12: qty 60, 30d supply, fill #0
  Filled 2021-04-17: qty 60, 30d supply, fill #1
  Filled 2021-04-17: qty 60, 30d supply, fill #0
  Filled 2021-06-13: qty 60, 30d supply, fill #1

## 2021-02-12 MED ORDER — ALBUTEROL SULFATE HFA 108 (90 BASE) MCG/ACT IN AERS
2.0000 | INHALATION_SPRAY | Freq: Four times a day (QID) | RESPIRATORY_TRACT | 2 refills | Status: DC | PRN
  Filled 2021-02-12 – 2021-03-26 (×2): qty 8.5, 25d supply, fill #0
  Filled 2021-03-26: qty 8.5, 25d supply, fill #1
  Filled 2021-04-17: qty 8.5, 25d supply, fill #0
  Filled 2021-04-19: qty 8.5, 25d supply, fill #1

## 2021-02-12 NOTE — Patient Instructions (Signed)
Healthy Eating °Following a healthy eating pattern may help you to achieve and maintain a healthy body weight, reduce the risk of chronic disease, and live a long and productive life. It is important to follow a healthy eating pattern at an appropriate calorie level for your body. Your nutritional needs should be met primarily through food by choosing a variety of nutrient-rich foods. °What are tips for following this plan? °Reading food labels °Read labels and choose the following: °Reduced or low sodium. °Juices with 100% fruit juice. °Foods with low saturated fats and high polyunsaturated and monounsaturated fats. °Foods with whole grains, such as whole wheat, cracked wheat, brown rice, and wild rice. °Whole grains that are fortified with folic acid. This is recommended for women who are pregnant or who want to become pregnant. °Read labels and avoid the following: °Foods with a lot of added sugars. These include foods that contain brown sugar, corn sweetener, corn syrup, dextrose, fructose, glucose, high-fructose corn syrup, honey, invert sugar, lactose, malt syrup, maltose, molasses, raw sugar, sucrose, trehalose, or turbinado sugar. °Do not eat more than the following amounts of added sugar per day: °6 teaspoons (25 g) for women. °9 teaspoons (38 g) for men. °Foods that contain processed or refined starches and grains. °Refined grain products, such as white flour, degermed cornmeal, white bread, and white rice. °Shopping °Choose nutrient-rich snacks, such as vegetables, whole fruits, and nuts. Avoid high-calorie and high-sugar snacks, such as potato chips, fruit snacks, and candy. °Use oil-based dressings and spreads on foods instead of solid fats such as butter, stick margarine, or cream cheese. °Limit pre-made sauces, mixes, and "instant" products such as flavored rice, instant noodles, and ready-made pasta. °Try more plant-protein sources, such as tofu, tempeh, black beans, edamame, lentils, nuts, and  seeds. °Explore eating plans such as the Mediterranean diet or vegetarian diet. °Cooking °Use oil to sauté or stir-fry foods instead of solid fats such as butter, stick margarine, or lard. °Try baking, boiling, grilling, or broiling instead of frying. °Remove the fatty part of meats before cooking. °Steam vegetables in water or broth. °Meal planning ° °At meals, imagine dividing your plate into fourths: °One-half of your plate is fruits and vegetables. °One-fourth of your plate is whole grains. °One-fourth of your plate is protein, especially lean meats, poultry, eggs, tofu, beans, or nuts. °Include low-fat dairy as part of your daily diet. °Lifestyle °Choose healthy options in all settings, including home, work, school, restaurants, or stores. °Prepare your food safely: °Wash your hands after handling raw meats. °Keep food preparation surfaces clean by regularly washing with hot, soapy water. °Keep raw meats separate from ready-to-eat foods, such as fruits and vegetables. °Cook seafood, meat, poultry, and eggs to the recommended internal temperature. °Store foods at safe temperatures. In general: °Keep cold foods at 40°F (4.4°C) or below. °Keep hot foods at 140°F (60°C) or above. °Keep your freezer at 0°F (-17.8°C) or below. °Foods are no longer safe to eat when they have been between the temperatures of 40°-140°F (4.4-60°C) for more than 2 hours. °What foods should I eat? °Fruits °Aim to eat 2 cup-equivalents of fresh, canned (in natural juice), or frozen fruits each day. Examples of 1 cup-equivalent of fruit include 1 small apple, 8 large strawberries, 1 cup canned fruit, ½ cup dried fruit, or 1 cup 100% juice. °Vegetables °Aim to eat 2½-3 cup-equivalents of fresh and frozen vegetables each day, including different varieties and colors. Examples of 1 cup-equivalent of vegetables include 2 medium carrots, 2 cups raw,   leafy greens, 1 cup chopped vegetable (raw or cooked), or 1 medium baked potato. °Grains °Aim to  eat 6 ounce-equivalents of whole grains each day. Examples of 1 ounce-equivalent of grains include 1 slice of bread, 1 cup ready-to-eat cereal, 3 cups popcorn, or ½ cup cooked rice, pasta, or cereal. °Meats and other proteins °Aim to eat 5-6 ounce-equivalents of protein each day. Examples of 1 ounce-equivalent of protein include 1 egg, 1/2 cup nuts or seeds, or 1 tablespoon (16 g) peanut butter. A cut of meat or fish that is the size of a deck of cards is about 3-4 ounce-equivalents. °Of the protein you eat each week, try to have at least 8 ounces come from seafood. This includes salmon, trout, herring, and anchovies. °Dairy °Aim to eat 3 cup-equivalents of fat-free or low-fat dairy each day. Examples of 1 cup-equivalent of dairy include 1 cup (240 mL) milk, 8 ounces (250 g) yogurt, 1½ ounces (44 g) natural cheese, or 1 cup (240 mL) fortified soy milk. °Fats and oils °Aim for about 5 teaspoons (21 g) per day. Choose monounsaturated fats, such as canola and olive oils, avocados, peanut butter, and most nuts, or polyunsaturated fats, such as sunflower, corn, and soybean oils, walnuts, pine nuts, sesame seeds, sunflower seeds, and flaxseed. °Beverages °Aim for six 8-oz glasses of water per day. Limit coffee to three to five 8-oz cups per day. °Limit caffeinated beverages that have added calories, such as soda and energy drinks. °Limit alcohol intake to no more than 1 drink a day for nonpregnant women and 2 drinks a day for men. One drink equals 12 oz of beer (355 mL), 5 oz of wine (148 mL), or 1½ oz of hard liquor (44 mL). °Seasoning and other foods °Avoid adding excess amounts of salt to your foods. Try flavoring foods with herbs and spices instead of salt. °Avoid adding sugar to foods. °Try using oil-based dressings, sauces, and spreads instead of solid fats. °This information is based on general [place] nutrition guidelines. For more information, visit choosemyplate.gov. Exact amounts may vary based on your nutrition  needs. °Summary °A healthy eating plan may help you to maintain a healthy weight, reduce the risk of chronic diseases, and stay active throughout your life. °Plan your meals. Make sure you eat the right portions of a variety of nutrient-rich foods. °Try baking, boiling, grilling, or broiling instead of frying. °Choose healthy options in all settings, including home, work, school, restaurants, or stores. °This information is not intended to replace advice given to you by your health care provider. Make sure you discuss any questions you have with your health care provider. °Document Revised: 10/16/2020 Document Reviewed: 10/16/2020 °Elsevier Patient Education © 2022 [employer] ° °

## 2021-02-12 NOTE — Progress Notes (Signed)
Patient ID: Brandy Stevenson, female    DOB: 1955/12/31  MRN: [id number]  CC: Chronic disease management.  Subjective: Brandy Stevenson is a 65 y.o. female who presents for chronic disease management. Her concerns today include:  Hx of PAD, HTN, HL, former tob dep, LT breast CA (lumpectomy and XRT 2009), GERD, COPD, Trigeminal neuralgia RT, dep/anxiety, OA shoulders  PAD: Recently saw Dr. Scot Dock on on 01/16/2021.  She was having some swelling in the legs.  Doppler ultrasound was negative.  He stated that her claudication symptoms are stable.  Plan is for her to follow-up with him in 1 year.  HYPERTENSION Currently taking: see medication list.  He confirms taking amlodipine, hydralazine, lisinopril, carvedilol, spironolactone and potassium supplement. Med Adherence: [x]  Yes  but just took meds after BP was checked here in office   Medication side effects: []  Yes    [x]  No Adherence with salt restriction: [x]  Yes  but ate some canned chicken noodle soup last night   Home Monitoring?: [x]  Yes  but not often because "the more I check it, it seems the higher it is."  Recently BP has been good Monitoring Frequency:  Home BP results range:  SOB? []  Yes    [x]  No Chest Pain?: []  Yes    [x]  No Leg swelling?: [x]  Yes -sometimes   []  No Headaches?: []  Yes    [x]  No Dizziness? []  Yes    [x]  No Comments:   COPD:  using Advair BID but still has to use Albuterol inh Q 6 hrs because of wheezing.  No recent increase cough.  Still tob free  Obese:  no change in wgh since last visit Cooks 3 x/wk.  Does not eat out much. Admits portion sizes varies. Drinks mainly water; drinks two 12 ox Ginger Ale soda a day.  Not getting in much activity.  Plans to sign up at gym in new yr as it is covered by her insurance  HM:  declines flu and shingles vaccines  Patient Active Problem List   Diagnosis Date Noted   Influenza vaccine refused 02/03/2020   Resistant hypertension 01/20/2020   Tubular  adenoma of colon 04/27/2019   Cervical radiculopathy 10/01/2018   Hypertensive retinopathy 09/08/2018   Seasonal allergies 06/29/2018   Aortoiliac occlusive disease ([place]) 11/04/2017   DOE (dyspnea on exertion) 11/03/2017   Chronic chest wall pain 11/03/2017   Ichthyosis 10/06/2017   Former smoker 04/07/2017   Primary osteoarthritis of right shoulder 04/07/2017   Chronic midline low back pain without sciatica 04/07/2017   Hiatal hernia 05/01/2016   DJD (degenerative joint disease) of cervical spine 04/17/2016   History of breast cancer 02/18/2016   Foot callus 02/18/2016   Anxiety and depression 05/22/2015   Insomnia 03/15/2015   Unspecified vitamin D deficiency 04/08/2013   Hyperlipidemia with target LDL less than 70 04/08/2013   Atherosclerotic PVD with intermittent claudication ([place]) 03/22/2013   Accelerated hypertension 02/23/2013   GERD (gastroesophageal reflux disease) 02/23/2013   COPD (chronic obstructive pulmonary disease) ([place]) 02/23/2013     Current Outpatient Medications on File Prior to Visit  Medication Sig Dispense Refill   albuterol (PROAIR HFA) 108 (90 Base) MCG/ACT inhaler Inhale 2 puffs into the lungs every 6 (six) hours as needed for wheezing or shortness of breath. 8.5 g 2   amLODipine (NORVASC) 10 MG tablet TAKE 1 TABLET BY MOUTH AT  BEDTIME 90 tablet 1   aspirin EC 81 MG tablet Take 1 tablet (81 mg total)  by mouth daily. 90 tablet 3   atorvastatin (LIPITOR) 40 MG tablet TAKE 1 TABLET BY MOUTH  DAILY 90 tablet 3   carvedilol (COREG) 6.25 MG tablet TAKE 1 TABLET (6.25 MG TOTAL) BY MOUTH 2 (TWO) TIMES DAILY. 180 tablet 1   cilostazol (PLETAL) 50 MG tablet TAKE 1 TABLET (50 MG TOTAL) BY MOUTH TWICE DAILY 60 tablet 0   gabapentin (NEURONTIN) 300 MG capsule TAKE 1 CAPSULE BY MOUTH AT  BEDTIME 90 capsule 3   hydrALAZINE (APRESOLINE) 25 MG tablet TAKE 1 TABLET (25 MG TOTAL) BY MOUTH 2 (TWO) TIMES DAILY. MAY TAKE A TABLET IF BLOOD PRESSURE IS ABOVE 150/90 AFTER ONE HOUR  RECHECK. 180 tablet 1   lisinopril (ZESTRIL) 40 MG tablet TAKE 1 TABLET BY MOUTH  DAILY 90 tablet 3   meloxicam (MOBIC) 15 MG tablet TAKE 1 TABLET BY MOUTH  DAILY 90 tablet 3   nystatin (MYCOSTATIN) 100000 UNIT/ML suspension TAKE 5 MLS (500,000 UNITS TOTAL) BY MOUTH 4 (FOUR) TIMES DAILY. SWISH AND SWALLOW 100 mL 0   omeprazole (PRILOSEC) 40 MG capsule TAKE 1 CAPSULE BY MOUTH  DAILY 90 capsule 3   potassium chloride (KLOR-CON) 10 MEQ tablet Take 2 tablets (20 mEq total) by mouth daily. 60 tablet 4   spironolactone (ALDACTONE) 25 MG tablet Take 1 tablet (25 mg total) by mouth daily. OFFICE VISIT NEEDED FOR ADDITIONAL REFILLS 90 tablet 0   No current facility-administered medications on file prior to visit.    Allergies  Allergen Reactions   Vicodin [Hydrocodone-Acetaminophen] Nausea And Vomiting and Other (See Comments)    Sweating   Bactrim [Sulfamethoxazole-Trimethoprim]     HA and vomiting   Incruse Ellipta [Umeclidinium Bromide]     Dry mouth   Wellbutrin [Bupropion]     Made me feel bad    Social History   Socioeconomic History   Marital status: Married    Spouse name: Not on file   Number of children: Not on file   Years of education: Not on file   Highest education level: Not on file  Occupational History   Not on file  Tobacco Use   Smoking status: Former    Packs/day: 0.25    Years: 40.00    Pack years: 10.00    Types: Cigarettes    Quit date: 02/08/2017    Years since quitting: 4.0   Smokeless tobacco: Never  Vaping Use   Vaping Use: Never used  Substance and Sexual Activity   Alcohol use: No   Drug use: No   Sexual activity: Yes  Other Topics Concern   Not on file  Social History Narrative   Not on file   Social Determinants of Health   Financial Resource Strain: Not on file  Food Insecurity: Not on file  Transportation Needs: Not on file  Physical Activity: Not on file  Stress: Not on file  Social Connections: Not on file  Intimate Partner  Violence: Not on file    Family History  Problem Relation Age of Onset   Hyperlipidemia Mother    Hypertension Mother    Heart disease Mother        Atrial Fib.   Hyperlipidemia Sister    Hypertension Sister    Heart disease Sister        Before age 71,  CHF   Heart attack Brother    Hyperlipidemia Brother    Hypertension Brother    Heart disease Brother    Hypertension Daughter  Past Surgical History:  Procedure Laterality Date   APPENDECTOMY  1980   BREAST LUMPECTOMY Left    s/p radiation therapy    BREAST SURGERY     COLONOSCOPY  07/2015   COLONOSCOPY WITH PROPOFOL N/A 06/26/2015   Procedure: COLONOSCOPY WITH PROPOFOL;  Surgeon: [name], MD;  Location: [facility];  Service: Endoscopy;  Laterality: N/A;   CT CTA CORONARY W/CA SCORE W/CM &/OR WO/CM  12/10/2017   Coronary calcium score is 0.  No evidence of CAD.   PERIPHERAL VASCULAR CATHETERIZATION N/A 10/22/2015   Procedure: Abdominal Aortogram w/Lower Extremity;  Surgeon: [name], MD;  Location: [facility] INVASIVE CV LAB: Aortic arch widely patent as far as innominate artery, right subclavian artery, right vertebral artery and right, common carotid.  Bilateral single renal arteries - Nonocclusive disease. Infrarenal aorta 100% - bilateral common Iliac 100% --> B Ex Iliacs fill via Int Iliac. Leg OK   TRANSTHORACIC ECHOCARDIOGRAM  08/2017   EF 60-65%. No RWMA.  Normal DF.  Normal valves.    TUBAL LIGATION  1991    ROS: Review of Systems Negative except as stated above  PHYSICAL EXAM: BP (!) 146/72    Pulse 67    Ht 5\' 5"  (1.651 m)    Wt 190 lb (86.2 kg)    SpO2 98%    BMI 31.62 kg/m   Wt Readings from Last 3 Encounters:  02/12/21 190 lb (86.2 kg)  01/16/21 189 lb 12.8 oz (86.1 kg)  10/23/20 190 lb 9.6 oz (86.5 kg)    Physical Exam   General appearance - alert, well appearing, obese older African-American female and in no distress Mental status - alert, oriented to person, place, and  time Neck - supple, no significant adenopathy Chest -few scattered wheezes.  Good air entry. Heart - normal rate, regular rhythm, normal S1, S2, no murmurs, rubs, clicks or gallops Extremities -trace lower extremity edema. Skin -dry scaly skin on both lower legs.  CMP Latest Ref Rng & Units 07/17/2020 06/14/2020 07/08/2019  Glucose 65 - 99 mg/dL - 96 -  BUN 8 - 27 mg/dL - 7(L) -  Creatinine 0.57 - 1.00 mg/dL - 0.81 -  Sodium 134 - 144 mmol/L - 143 -  Potassium 3.5 - 5.2 mmol/L 3.4(L) 3.1(L) 3.7  Chloride 96 - 106 mmol/L - 102 -  CO2 20 - 29 mmol/L - 23 -  Calcium 8.7 - 10.3 mg/dL - 9.5 -  Total Protein 6.0 - 8.5 g/dL - 8.1 -  Total Bilirubin 0.0 - 1.2 mg/dL - 0.3 -  Alkaline Phos 44 - 121 IU/L - 78 -  AST 0 - 40 IU/L - 27 -  ALT 0 - 32 IU/L - 33(H) -   Lipid Panel     Component Value Date/Time   CHOL 157 06/14/2020 1010   TRIG 70 06/14/2020 1010   HDL 66 06/14/2020 1010   CHOLHDL 2.4 06/14/2020 1010   CHOLHDL 2.8 07/27/2014 1207   VLDL 32 07/27/2014 1207   LDLCALC 77 06/14/2020 1010    CBC    Component Value Date/Time   WBC 3.6 06/14/2020 1010   WBC 6.6 08/25/2017 0118   RBC 4.10 06/14/2020 1010   RBC 4.22 08/25/2017 0118   HGB 12.7 06/14/2020 1010   HGB 13.8 06/12/2008 0939   HCT 38.9 06/14/2020 1010   HCT 40.2 06/12/2008 0939   PLT 230 06/14/2020 1010   MCV 95 06/14/2020 1010   MCV 90.1 06/12/2008 0939   MCH  31.0 06/14/2020 1010   MCH 30.3 08/25/2017 0118   MCHC 32.6 06/14/2020 1010   MCHC 32.4 08/25/2017 0118   RDW 13.1 06/14/2020 1010   RDW 13.9 06/12/2008 0939   LYMPHSABS 0.4 (L) 03/10/2017 1335   LYMPHSABS 1.5 06/12/2008 0939   MONOABS 0.5 03/23/2016 2308   MONOABS 0.6 06/12/2008 0939   EOSABS 0.1 03/10/2017 1335   BASOSABS 0.0 03/10/2017 1335   BASOSABS 0.0 06/12/2008 0939    ASSESSMENT AND PLAN: 1. Essential hypertension Blood pressure today improved on repeat but still not at goal.  Advised no changes at this time as she has just taken her blood  pressure medications.  She will continue her current medications and low-salt diet. - Basic Metabolic Panel  2. Chronic obstructive pulmonary disease, unspecified COPD type ([place]) Not well controlled.  I had thought about adding Spiriva, however she did not tolerate Incruse in the past and is listed on her allergy.  Instead we will increase the dose of the Advair from 100/50 to 250/50.  Advised to wash mouth out after each use. - fluticasone-salmeterol (ADVAIR) 250-50 MCG/ACT AEPB; Inhale 1 puff into the lungs in the morning and at bedtime.  Dispense: 60 each; Refill: 6  3. Obesity (BMI 30.0-34.9) Discussed and encourage healthy eating habits.  Encouraged her to get rid of the sugary drinks.  She will try to cut back on the ginger ale to just 1 a day.  4. Dry skin Recommend using Aveeno intensive care lotion or Curel both of which can be purchased over-the-counter.  5. Influenza vaccination declined Recommended.  Patient declined.  Patient also declined Shingrix.    Patient was given the opportunity to ask questions.  Patient verbalized understanding of the plan and was able to repeat key elements of the plan.   Orders Placed This Encounter  Procedures   Basic Metabolic Panel     Requested Prescriptions   Signed Prescriptions Disp Refills   fluticasone-salmeterol (ADVAIR) 250-50 MCG/ACT AEPB 60 each 6    Sig: Inhale 1 puff into the lungs in the morning and at bedtime.    Return in about 4 months (around 06/13/2021).  Karle Plumber, MD, FACP

## 2021-02-13 ENCOUNTER — Other Ambulatory Visit: Payer: Self-pay

## 2021-02-13 LAB — BASIC METABOLIC PANEL
BUN/Creatinine Ratio: 9 — ABNORMAL LOW (ref 12–28)
BUN: 7 mg/dL — ABNORMAL LOW (ref 8–27)
CO2: 23 mmol/L (ref 20–29)
Calcium: 9.3 mg/dL (ref 8.7–10.3)
Chloride: 103 mmol/L (ref 96–106)
Creatinine, Ser: 0.78 mg/dL (ref 0.57–1.00)
Glucose: 98 mg/dL (ref 70–99)
Potassium: 3.6 mmol/L (ref 3.5–5.2)
Sodium: 142 mmol/L (ref 134–144)
eGFR: 85 mL/min/{1.73_m2} (ref >59)

## 2021-03-01 ENCOUNTER — Other Ambulatory Visit: Payer: Self-pay | Admitting: Internal Medicine

## 2021-03-01 NOTE — Telephone Encounter (Signed)
Requested medication (s) are due for refill today: no  Requested medication (s) are on the active medication list: yes  Last refill:  02/08/21  Future visit scheduled: no  Notes to clinic:  Unable to refill per protocol, medication not assigned to the refill protocol.      Requested Prescriptions  Pending Prescriptions Disp Refills   nystatin (MYCOSTATIN) 100000 UNIT/ML suspension 100 mL 0    Sig: TAKE 5 MLS (500,000 UNITS TOTAL) BY MOUTH 4 (FOUR) TIMES DAILY. SWISH AND SWALLOW     Off-Protocol Failed - 03/01/2021 12:25 PM      Failed - Medication not assigned to a protocol, review manually.      Passed - Valid encounter within last 12 months    Recent Outpatient Visits           2 weeks ago Essential hypertension   [place], MD   4 months ago Encounter for [employer] annual wellness exam   [place] [name], MD   8 months ago Essential hypertension   [place] [name], MD   1 year ago Abscess of abdominal wall   [place], Deborah B, MD   1 year ago Essential hypertension   [place] [name], MD

## 2021-03-02 MED ORDER — NYSTATIN 100000 UNIT/ML MT SUSP
OROMUCOSAL | 0 refills | Status: DC
  Filled 2021-03-02: qty 100, 5d supply, fill #0
  Filled 2021-03-26: qty 100, fill #0
  Filled 2021-03-26: qty 100, 5d supply, fill #0

## 2021-03-04 ENCOUNTER — Other Ambulatory Visit: Payer: Self-pay

## 2021-03-18 ENCOUNTER — Other Ambulatory Visit: Payer: Self-pay | Admitting: Internal Medicine

## 2021-03-18 DIAGNOSIS — I739 Peripheral vascular disease, unspecified: Secondary | ICD-10-CM

## 2021-03-18 NOTE — Telephone Encounter (Signed)
Requested medication (s) are due for refill today:   Yes  Requested medication (s) are on the active medication list:   Yes  Future visit scheduled:   No  Seen 1 mo. ago   Last ordered: 02/04/2021 #60, 0 refills  Returned for provider to review for refills since labs are over due per protocol.   Requested Prescriptions  Pending Prescriptions Disp Refills   cilostazol (PLETAL) 50 MG tablet [Pharmacy Med Name: CILOSTAZOL  50MG   TAB] 60 tablet 11    Sig: TAKE 1 TABLET BY MOUTH TWICE  DAILY     Hematology: Antiplatelets - cilostazol Failed - 03/18/2021  6:10 AM      Failed - HCT in normal range and within 180 days    HCT  Date Value Ref Range Status  06/12/2008 40.2 34.8 - 46.6 % Final   Hematocrit  Date Value Ref Range Status  06/14/2020 38.9 34.0 - 46.6 % Final          Failed - HGB in normal range and within 180 days    Hemoglobin  Date Value Ref Range Status  06/14/2020 12.7 11.1 - 15.9 g/dL Final   HGB  Date Value Ref Range Status  06/12/2008 13.8 11.6 - 15.9 g/dL Final          Failed - PLT in normal range and within 180 days    Platelets  Date Value Ref Range Status  06/14/2020 230 150 - 450 x10E3/uL Final          Failed - WBC in normal range and within 180 days    WBC  Date Value Ref Range Status  06/14/2020 3.6 3.4 - 10.8 x10E3/uL Final  08/25/2017 6.6 4.0 - 10.5 K/uL Final          Passed - Valid encounter within last 6 months    Recent Outpatient Visits           1 month ago Essential hypertension   [place], Deborah B, MD   4 months ago Encounter for [employer] annual wellness exam   [place] [name], MD   9 months ago Essential hypertension   [place] [name], MD   1 year ago Abscess of abdominal wall   [place] [name], MD   1 year ago Essential hypertension   [place], Deborah B, MD

## 2021-03-20 ENCOUNTER — Other Ambulatory Visit: Payer: Self-pay | Admitting: Internal Medicine

## 2021-03-20 DIAGNOSIS — I739 Peripheral vascular disease, unspecified: Secondary | ICD-10-CM

## 2021-03-20 NOTE — Telephone Encounter (Signed)
Already ordered today by [facility], will refuse this request.   Requested Prescriptions  Pending Prescriptions Disp Refills   cilostazol (PLETAL) 50 MG tablet [Pharmacy Med Name: CILOSTAZOL  50MG   TAB] 60 tablet 11    Sig: TAKE 1 TABLET BY MOUTH TWICE  DAILY     Hematology: Antiplatelets - cilostazol Failed - 03/20/2021  8:58 AM      Failed - HCT in normal range and within 180 days    HCT  Date Value Ref Range Status  06/12/2008 40.2 34.8 - 46.6 % Final   Hematocrit  Date Value Ref Range Status  06/14/2020 38.9 34.0 - 46.6 % Final         Failed - HGB in normal range and within 180 days    Hemoglobin  Date Value Ref Range Status  06/14/2020 12.7 11.1 - 15.9 g/dL Final   HGB  Date Value Ref Range Status  06/12/2008 13.8 11.6 - 15.9 g/dL Final         Failed - PLT in normal range and within 180 days    Platelets  Date Value Ref Range Status  06/14/2020 230 150 - 450 x10E3/uL Final         Failed - WBC in normal range and within 180 days    WBC  Date Value Ref Range Status  06/14/2020 3.6 3.4 - 10.8 x10E3/uL Final  08/25/2017 6.6 4.0 - 10.5 K/uL Final         Passed - Valid encounter within last 6 months    Recent Outpatient Visits          1 month ago Essential hypertension   [place], Deborah B, MD   4 months ago Encounter for [employer] annual wellness exam   [place] [name], MD   9 months ago Essential hypertension   [place] [name], MD   1 year ago Abscess of abdominal wall   [place] [name], MD   1 year ago Essential hypertension   [place], Deborah B, MD

## 2021-03-26 ENCOUNTER — Other Ambulatory Visit: Payer: Self-pay | Admitting: Internal Medicine

## 2021-03-26 ENCOUNTER — Other Ambulatory Visit: Payer: Self-pay

## 2021-03-26 DIAGNOSIS — I10 Essential (primary) hypertension: Secondary | ICD-10-CM

## 2021-03-26 MED ORDER — SPIRONOLACTONE 25 MG PO TABS
25.0000 mg | ORAL_TABLET | Freq: Every day | ORAL | 0 refills | Status: DC
  Filled 2021-03-26: qty 90, 90d supply, fill #0

## 2021-03-26 NOTE — Telephone Encounter (Signed)
Requested Prescriptions  Pending Prescriptions Disp Refills   spironolactone (ALDACTONE) 25 MG tablet 90 tablet 0    Sig: Take 1 tablet (25 mg total) by mouth daily. OFFICE VISIT NEEDED FOR ADDITIONAL REFILLS     Cardiovascular: Diuretics - Aldosterone Antagonist Failed - 03/26/2021  8:55 AM      Failed - Last BP in normal range    BP Readings from Last 1 Encounters:  02/12/21 (!) 146/72         Passed - Cr in normal range and within 360 days    Creat  Date Value Ref Range Status  04/17/2016 0.77 0.50 - 1.05 mg/dL Final    Comment:      For patients > or = 65 years of age: The upper reference limit for Creatinine is approximately 13% higher for people identified as African-American.      Creatinine, Ser  Date Value Ref Range Status  02/12/2021 0.78 0.57 - 1.00 mg/dL Final         Passed - K in normal range and within 360 days    Potassium  Date Value Ref Range Status  02/12/2021 3.6 3.5 - 5.2 mmol/L Final         Passed - Na in normal range and within 360 days    Sodium  Date Value Ref Range Status  02/12/2021 142 134 - 144 mmol/L Final         Passed - Valid encounter within last 6 months    Recent Outpatient Visits          1 month ago Essential hypertension   [place], MD   5 months ago Encounter for [employer] annual wellness exam   [place] [name], MD   9 months ago Essential hypertension   [place] [name], MD   1 year ago Abscess of abdominal wall   [place], Deborah B, MD   1 year ago Essential hypertension   [place] [name], MD

## 2021-04-06 ENCOUNTER — Other Ambulatory Visit: Payer: Self-pay | Admitting: Internal Medicine

## 2021-04-06 NOTE — Telephone Encounter (Signed)
Requested Prescriptions  Pending Prescriptions Disp Refills   amLODipine (NORVASC) 10 MG tablet [Pharmacy Med Name: amLODIPine Besylate 10 MG Oral Tablet] 90 tablet 3    Sig: TAKE 1 TABLET BY MOUTH AT  BEDTIME     Cardiovascular: Calcium Channel Blockers 2 Failed - 04/06/2021  5:34 AM      Failed - Last BP in normal range    BP Readings from Last 1 Encounters:  02/12/21 (!) 146/72         Passed - Last Heart Rate in normal range    Pulse Readings from Last 1 Encounters:  02/12/21 67         Passed - Valid encounter within last 6 months    Recent Outpatient Visits          1 month ago Essential hypertension   [place], MD   5 months ago Encounter for [employer] annual wellness exam   [place] [name], MD   9 months ago Essential hypertension   [place] [name], MD   1 year ago Abscess of abdominal wall   [place], Deborah B, MD   1 year ago Essential hypertension   [place] [name], MD

## 2021-04-17 ENCOUNTER — Other Ambulatory Visit: Payer: Self-pay | Admitting: Internal Medicine

## 2021-04-17 DIAGNOSIS — I10 Essential (primary) hypertension: Secondary | ICD-10-CM

## 2021-04-17 MED ORDER — HYDRALAZINE HCL 25 MG PO TABS
ORAL_TABLET | ORAL | 0 refills | Status: DC
  Filled 2021-04-17: qty 180, 90d supply, fill #0

## 2021-04-17 MED ORDER — NYSTATIN 100000 UNIT/ML MT SUSP
OROMUCOSAL | 0 refills | Status: DC
  Filled 2021-04-17: qty 100, 5d supply, fill #0

## 2021-04-17 MED ORDER — CARVEDILOL 6.25 MG PO TABS
ORAL_TABLET | Freq: Two times a day (BID) | ORAL | 0 refills | Status: DC
  Filled 2021-04-17: qty 180, 90d supply, fill #0

## 2021-04-18 ENCOUNTER — Other Ambulatory Visit: Payer: Self-pay

## 2021-04-19 ENCOUNTER — Other Ambulatory Visit: Payer: Self-pay

## 2021-04-23 ENCOUNTER — Other Ambulatory Visit: Payer: Self-pay

## 2021-04-26 ENCOUNTER — Encounter: Payer: Self-pay | Admitting: Internal Medicine

## 2021-05-22 ENCOUNTER — Telehealth: Payer: Self-pay | Admitting: Internal Medicine

## 2021-05-22 NOTE — Telephone Encounter (Signed)
Patient with A1c of 6.1 down 04/25/2021 by nurse practitioner doing [facility]. ?

## 2021-05-23 NOTE — Telephone Encounter (Signed)
Sent pt a MyChart message

## 2021-05-28 ENCOUNTER — Other Ambulatory Visit: Payer: Self-pay | Admitting: Internal Medicine

## 2021-05-28 DIAGNOSIS — E875 Hyperkalemia: Secondary | ICD-10-CM

## 2021-05-29 ENCOUNTER — Other Ambulatory Visit: Payer: Self-pay

## 2021-05-29 MED ORDER — POTASSIUM CHLORIDE ER 10 MEQ PO TBCR
20.0000 meq | EXTENDED_RELEASE_TABLET | Freq: Every day | ORAL | 0 refills | Status: DC
  Filled 2021-05-29: qty 60, 30d supply, fill #0

## 2021-05-30 ENCOUNTER — Other Ambulatory Visit: Payer: Self-pay

## 2021-06-13 ENCOUNTER — Other Ambulatory Visit: Payer: Self-pay | Admitting: Internal Medicine

## 2021-06-14 ENCOUNTER — Encounter: Payer: Self-pay | Admitting: Internal Medicine

## 2021-06-14 ENCOUNTER — Other Ambulatory Visit: Payer: Self-pay

## 2021-06-14 ENCOUNTER — Ambulatory Visit: Payer: Medicare Other | Attending: Internal Medicine | Admitting: Internal Medicine

## 2021-06-14 VITALS — BP 144/87 | HR 70 | Resp 16 | Wt 188.0 lb

## 2021-06-14 DIAGNOSIS — E669 Obesity, unspecified: Secondary | ICD-10-CM | POA: Diagnosis not present

## 2021-06-14 DIAGNOSIS — I739 Peripheral vascular disease, unspecified: Secondary | ICD-10-CM | POA: Diagnosis not present

## 2021-06-14 DIAGNOSIS — I10 Essential (primary) hypertension: Secondary | ICD-10-CM

## 2021-06-14 DIAGNOSIS — R7303 Prediabetes: Secondary | ICD-10-CM

## 2021-06-14 DIAGNOSIS — J449 Chronic obstructive pulmonary disease, unspecified: Secondary | ICD-10-CM

## 2021-06-14 MED ORDER — SPIRONOLACTONE 25 MG PO TABS
25.0000 mg | ORAL_TABLET | Freq: Every day | ORAL | 3 refills | Status: DC
  Filled 2021-06-14: qty 90, 90d supply, fill #0
  Filled 2021-09-25: qty 90, 90d supply, fill #1
  Filled 2021-10-23 – 2021-12-09 (×2): qty 90, 90d supply, fill #2
  Filled 2022-03-12: qty 90, 90d supply, fill #3

## 2021-06-14 MED ORDER — NYSTATIN 100000 UNIT/ML MT SUSP
OROMUCOSAL | 0 refills | Status: DC
  Filled 2021-06-14: qty 100, 5d supply, fill #0

## 2021-06-14 MED ORDER — ALBUTEROL SULFATE HFA 108 (90 BASE) MCG/ACT IN AERS
2.0000 | INHALATION_SPRAY | Freq: Four times a day (QID) | RESPIRATORY_TRACT | 5 refills | Status: DC | PRN
  Filled 2021-06-14: qty 8.5, 25d supply, fill #0
  Filled 2021-08-15: qty 8.5, 25d supply, fill #1
  Filled 2021-10-01: qty 8.5, 25d supply, fill #2
  Filled 2021-10-23 – 2021-11-07 (×2): qty 6.7, 25d supply, fill #3
  Filled 2021-12-09: qty 6.7, 25d supply, fill #4
  Filled 2022-01-21: qty 6.7, 25d supply, fill #5
  Filled 2022-02-20: qty 8.5, 32d supply, fill #6

## 2021-06-14 MED ORDER — HYDRALAZINE HCL 25 MG PO TABS
25.0000 mg | ORAL_TABLET | Freq: Three times a day (TID) | ORAL | 6 refills | Status: DC
  Filled 2021-06-14: qty 180, 60d supply, fill #0
  Filled 2021-07-11: qty 90, 30d supply, fill #0
  Filled 2021-08-15: qty 90, 30d supply, fill #1
  Filled 2021-09-17 – 2021-09-18 (×2): qty 90, 30d supply, fill #2
  Filled 2021-10-23 – 2021-11-07 (×2): qty 90, 30d supply, fill #3
  Filled 2021-12-20: qty 90, 30d supply, fill #4
  Filled 2022-01-21: qty 90, 30d supply, fill #5
  Filled 2022-02-20 (×2): qty 90, 30d supply, fill #6
  Filled 2022-03-26: qty 90, 30d supply, fill #7
  Filled 2022-05-08: qty 90, 30d supply, fill #8
  Filled 2022-06-10: qty 90, 30d supply, fill #9

## 2021-06-14 MED ORDER — FLUTICASONE-SALMETEROL 250-50 MCG/ACT IN AEPB
1.0000 | INHALATION_SPRAY | Freq: Two times a day (BID) | RESPIRATORY_TRACT | 6 refills | Status: DC
  Filled 2021-08-15: qty 60, 30d supply, fill #0
  Filled 2021-09-17: qty 60, 30d supply, fill #1
  Filled 2021-10-23 – 2021-12-09 (×2): qty 60, 30d supply, fill #2
  Filled 2022-01-06 – 2022-01-21 (×2): qty 60, 30d supply, fill #3
  Filled 2022-02-20 (×2): qty 60, 30d supply, fill #4
  Filled 2022-03-26: qty 60, 30d supply, fill #5
  Filled 2022-05-23 (×2): qty 60, 30d supply, fill #6

## 2021-06-14 NOTE — Progress Notes (Signed)
? ? ?Patient ID: Brandy Stevenson, female    DOB: 1956-01-02  MRN: [id number] ? ?CC: Hypertension ? ? ?Subjective: ?Brandy Stevenson is a 66 y.o. female who presents for chronic ds management ?Her concerns today include:  ?Hx of PAD, HTN, HL, former tob dep, LT breast CA (lumpectomy and XRT 2009), GERD, COPD, Trigeminal neuralgia RT, dep/anxiety, OA shoulders ? ?Had a House Call visit done 04/2021.  Had A1C that was 6.1 in range for preDM ?Since then she has been doing stretching exercises in bed. Walks some in her yard.  Does not walk in neighborhood because too many dogs.  She has [employer] government through her insurance and has a gym 5 mins from her house.  She and her husband plan to take advantage of the membership.  Wonders what she should do if she develops cramps in leg with walking at gym given hx of PAD.  However pain and muscle spasms in calf have dec compare to last yr.  Taking Pletal as prescribed ?-drinking 3 cans of [place] Dry Ale soda daily.  Otherwise has cut back on sweets ?  ?HTN:  Currently taking: She confirms taking amlodipine 10 mg, hydralazine 25 mg BID with option of taking TID if BP running high, lisinopril '40mg'$ ,  carvedilol 6.25 mg BID, spironolactone 25 mg and potassium supplement ?-she has not been checking BP as much ?She limits salt in foods. ? ?COPD:  doing well on the Advair 250/50 inhaler.  However it sometimes causes thrush.  She rinses mouth out after use.  Requests refill on nystatin suspension to use as needed.  Uses Albuterol inhaler sometimes up to 3x/day depending on what she is exposed too and if exerting self.  ? ?HL:  reports compliance with Atorvastatin ?Patient Active Problem List  ? Diagnosis Date Noted  ? Influenza vaccine refused 02/03/2020  ? Resistant hypertension 01/20/2020  ? Tubular adenoma of colon 04/27/2019  ? Cervical radiculopathy 10/01/2018  ? Hypertensive retinopathy 09/08/2018  ? Seasonal allergies 06/29/2018  ? Aortoiliac occlusive disease ([place])  11/04/2017  ? DOE (dyspnea on exertion) 11/03/2017  ? Chronic chest wall pain 11/03/2017  ? Ichthyosis 10/06/2017  ? Former smoker 04/07/2017  ? Primary osteoarthritis of right shoulder 04/07/2017  ? Chronic midline low back pain without sciatica 04/07/2017  ? Hiatal hernia 05/01/2016  ? DJD (degenerative joint disease) of cervical spine 04/17/2016  ? History of breast cancer 02/18/2016  ? Foot callus 02/18/2016  ? Anxiety and depression 05/22/2015  ? Insomnia 03/15/2015  ? Unspecified vitamin D deficiency 04/08/2013  ? Hyperlipidemia with target LDL less than 70 04/08/2013  ? Atherosclerotic PVD with intermittent claudication ([place]) 03/22/2013  ? Accelerated hypertension 02/23/2013  ? GERD (gastroesophageal reflux disease) 02/23/2013  ? COPD (chronic obstructive pulmonary disease) ([place]) 02/23/2013  ?  ? ?Current Outpatient Medications on File Prior to Visit  ?Medication Sig Dispense Refill  ? amLODipine (NORVASC) 10 MG tablet TAKE 1 TABLET BY MOUTH AT  BEDTIME 90 tablet 1  ? aspirin EC 81 MG tablet Take 1 tablet (81 mg total) by mouth daily. 90 tablet 3  ? atorvastatin (LIPITOR) 40 MG tablet TAKE 1 TABLET BY MOUTH  DAILY 90 tablet 3  ? carvedilol (COREG) 6.25 MG tablet TAKE 1 TABLET (6.25 MG TOTAL) BY MOUTH 2 (TWO) TIMES DAILY. 180 tablet 0  ? cilostazol (PLETAL) 50 MG tablet TAKE 1 TABLET BY MOUTH TWICE  DAILY 60 tablet 2  ? gabapentin (NEURONTIN) 300 MG capsule TAKE 1 CAPSULE BY MOUTH AT  BEDTIME 90 capsule 3  ? lisinopril (ZESTRIL) 40 MG tablet TAKE 1 TABLET BY MOUTH  DAILY 90 tablet 3  ? meloxicam (MOBIC) 15 MG tablet TAKE 1 TABLET BY MOUTH  DAILY 90 tablet 3  ? omeprazole (PRILOSEC) 40 MG capsule TAKE 1 CAPSULE BY MOUTH  DAILY 90 capsule 3  ? potassium chloride (KLOR-CON) 10 MEQ tablet Take 2 tablets (20 mEq total) by mouth daily. 60 tablet 0  ? ?No current facility-administered medications on file prior to visit.  ? ? ?Allergies  ?Allergen Reactions  ? Vicodin [Hydrocodone-Acetaminophen] Nausea And Vomiting  and Other (See Comments)  ?  Sweating  ? Bactrim [Sulfamethoxazole-Trimethoprim]   ?  HA and vomiting  ? Incruse Ellipta [Umeclidinium Bromide]   ?  Dry mouth  ? Wellbutrin [Bupropion]   ?  Made me feel bad  ? ? ?Social History  ? ?Socioeconomic History  ? Marital status: Married  ?  Spouse name: Not on file  ? Number of children: Not on file  ? Years of education: Not on file  ? Highest education level: Not on file  ?Occupational History  ? Not on file  ?Tobacco Use  ? Smoking status: Former  ?  Packs/day: 0.25  ?  Years: 40.00  ?  Pack years: 10.00  ?  Types: Cigarettes  ?  Quit date: 02/08/2017  ?  Years since quitting: 4.3  ? Smokeless tobacco: Never  ?Vaping Use  ? Vaping Use: Never used  ?Substance and Sexual Activity  ? Alcohol use: No  ? Drug use: No  ? Sexual activity: Yes  ?Other Topics Concern  ? Not on file  ?Social History Narrative  ? Not on file  ? ?Social Determinants of Health  ? ?Financial Resource Strain: Not on file  ?Food Insecurity: Not on file  ?Transportation Needs: Not on file  ?Physical Activity: Not on file  ?Stress: Not on file  ?Social Connections: Not on file  ?Intimate Partner Violence: Not on file  ? ? ?Family History  ?Problem Relation Age of Onset  ? Hyperlipidemia Mother   ? Hypertension Mother   ? Heart disease Mother   ?     Atrial Fib.  ? Hyperlipidemia Sister   ? Hypertension Sister   ? Heart disease Sister   ?     Before age 37,  CHF  ? Heart attack Brother   ? Hyperlipidemia Brother   ? Hypertension Brother   ? Heart disease Brother   ? Hypertension Daughter   ? ? ?Past Surgical History:  ?Procedure Laterality Date  ? APPENDECTOMY  1980  ? BREAST LUMPECTOMY Left   ? s/p radiation therapy   ? BREAST SURGERY    ? COLONOSCOPY  07/2015  ? COLONOSCOPY WITH PROPOFOL N/A 06/26/2015  ? Procedure: COLONOSCOPY WITH PROPOFOL;  Surgeon: [name], MD;  Location: [facility];  Service: Endoscopy;  Laterality: N/A;  ? CT CTA CORONARY W/CA SCORE W/CM &/OR WO/CM  12/10/2017  ? Coronary  calcium score is 0.  No evidence of CAD.  ? PERIPHERAL VASCULAR CATHETERIZATION N/A 10/22/2015  ? Procedure: Abdominal Aortogram w/Lower Extremity;  Surgeon: [name], MD;  Location: [facility] INVASIVE CV LAB: Aortic arch widely patent as far as innominate artery, right subclavian artery, right vertebral artery and right, common carotid.  Bilateral single renal arteries - Nonocclusive disease. Infrarenal aorta 100% - bilateral common Iliac 100% --> B Ex Iliacs fill via Int Iliac. Leg OK  ? TRANSTHORACIC ECHOCARDIOGRAM  08/2017  ?  EF 60-65%. No RWMA.  Normal DF.  Normal valves.   ? TUBAL LIGATION  1991  ? ? ?ROS: ?Review of Systems ?Negative except as stated above ? ?PHYSICAL EXAM: ?BP (!) 144/87   Pulse 70   Resp 16   Wt 188 lb (85.3 kg)   SpO2 96%   BMI 31.28 kg/m?   ?Wt Readings from Last 3 Encounters:  ?06/14/21 188 lb (85.3 kg)  ?02/12/21 190 lb (86.2 kg)  ?01/16/21 189 lb 12.8 oz (86.1 kg)  ? ? ?Physical Exam ? ?General appearance - alert, well appearing, older African-American female and in no distress ?Mental status - normal mood, behavior, speech, dress, motor activity, and thought processes ?Mouth - mucous membranes moist, pharynx normal without lesions.  No oral thrush noted at this time. ?Neck - supple, no significant adenopathy ?Chest - clear to auscultation, no wheezes, rales or rhonchi, symmetric air entry ?Heart - normal rate, regular rhythm, normal S1, S2, no murmurs, rubs, clicks or gallops ?Extremities -no lower extremity edema.  ? ? ? ?  Latest Ref Rng & Units 06/14/2021  ?  2:58 PM 02/12/2021  ? 10:12 AM 07/17/2020  ? 10:57 AM  ?CMP  ?Glucose 70 - 99 mg/dL 82   98     ?BUN 8 - 27 mg/dL 10   7     ?Creatinine 0.57 - 1.00 mg/dL 0.85   0.78     ?Sodium 134 - 144 mmol/L 143   142     ?Potassium 3.5 - 5.2 mmol/L 4.0   3.6   3.4    ?Chloride 96 - 106 mmol/L 107   103     ?CO2 20 - 29 mmol/L 22   23     ?Calcium 8.7 - 10.3 mg/dL 9.7   9.3     ?Total Protein 6.0 - 8.5 g/dL 7.7      ?Total  Bilirubin 0.0 - 1.2 mg/dL 0.3      ?Alkaline Phos 44 - 121 IU/L 72      ?AST 0 - 40 IU/L 16      ?ALT 0 - 32 IU/L 19      ? ?Lipid Panel  ?   ?Component Value Date/Time  ? CHOL 150 06/14/2021 1458  ? TRIG 98 06/14/2021 1

## 2021-06-14 NOTE — Patient Instructions (Signed)

## 2021-06-14 NOTE — Telephone Encounter (Signed)
Requested medication (s) are due for refill today: yes ? ?Requested medication (s) are on the active medication list: yes ? ?Last refill:  04/17/21 ? ?Future visit scheduled: today ? ?Notes to clinic:  pt has appt today - PCP will review and potentially reorder ? ? ?Requested Prescriptions  ?Pending Prescriptions Disp Refills  ? albuterol (PROAIR HFA) 108 (90 Base) MCG/ACT inhaler 8.5 g 2  ?  Sig: Inhale 2 puffs into the lungs every 6 (six) hours as needed for wheezing or shortness of breath.  ?  ? Pulmonology:  Beta Agonists 2 Failed - 06/13/2021 10:01 PM  ?  ?  Failed - Last BP in normal range  ?  BP Readings from Last 1 Encounters:  ?02/12/21 (!) 146/72  ?  ?  ?  ?  Passed - Last Heart Rate in normal range  ?  Pulse Readings from Last 1 Encounters:  ?02/12/21 67  ?  ?  ?  ?  Passed - Valid encounter within last 12 months  ?  Recent Outpatient Visits   ? ?      ? 4 months ago Essential hypertension  ? [place] [name], MD  ? 7 months ago Encounter for [employer] annual wellness exam  ? [facility] [name], MD  ? 1 year ago Essential hypertension  ? [place] Karle Plumber B, MD  ? 1 year ago Abscess of abdominal wall  ? [place] [name], MD  ? 1 year ago Essential hypertension  ? [place] [name], MD  ? ?  ?  ?Future Appointments   ? ?        ? Today [name], MD [place]  ? ?  ? ?  ?  ?  ? nystatin (MYCOSTATIN) 100000 UNIT/ML suspension 100 mL 0  ?  Sig: TAKE 5 MLS (500,000 UNITS TOTAL) BY MOUTH 4 (FOUR) TIMES DAILY. SWISH AND SWALLOW  ?  ? Off-Protocol Failed - 06/13/2021 10:01 PM  ?  ?  Failed - Medication not assigned to a protocol, review manually.  ?  ?  Passed - Valid encounter within last 12 months  ?  Recent Outpatient Visits   ? ?      ? 4 months ago Essential  hypertension  ? [place] [name], MD  ? 7 months ago Encounter for [employer] annual wellness exam  ? [facility] [name], MD  ? 1 year ago Essential hypertension  ? [place] Karle Plumber B, MD  ? 1 year ago Abscess of abdominal wall  ? [place] [name], MD  ? 1 year ago Essential hypertension  ? [place] [name], MD  ? ?  ?  ?Future Appointments   ? ?        ? Today [name], MD [place]  ? ?  ? ?  ?  ?  ? ? ? ? ?

## 2021-06-15 DIAGNOSIS — R7303 Prediabetes: Secondary | ICD-10-CM | POA: Insufficient documentation

## 2021-06-15 LAB — LIPID PANEL
Chol/HDL Ratio: 2.2 ratio (ref 0.0–4.4)
Cholesterol, Total: 150 mg/dL (ref 100–199)
HDL: 67 mg/dL (ref >39)
LDL Chol Calc (NIH): 65 mg/dL (ref 0–99)
Triglycerides: 98 mg/dL (ref 0–149)
VLDL Cholesterol Cal: 18 mg/dL (ref 5–40)

## 2021-06-15 LAB — COMPREHENSIVE METABOLIC PANEL
ALT: 19 IU/L (ref 0–32)
AST: 16 IU/L (ref 0–40)
Albumin/Globulin Ratio: 1.5 (ref 1.2–2.2)
Albumin: 4.6 g/dL (ref 3.8–4.8)
Alkaline Phosphatase: 72 IU/L (ref 44–121)
BUN/Creatinine Ratio: 12 (ref 12–28)
BUN: 10 mg/dL (ref 8–27)
Bilirubin Total: 0.3 mg/dL (ref 0.0–1.2)
CO2: 22 mmol/L (ref 20–29)
Calcium: 9.7 mg/dL (ref 8.7–10.3)
Chloride: 107 mmol/L — ABNORMAL HIGH (ref 96–106)
Creatinine, Ser: 0.85 mg/dL (ref 0.57–1.00)
Globulin, Total: 3.1 g/dL (ref 1.5–4.5)
Glucose: 82 mg/dL (ref 70–99)
Potassium: 4 mmol/L (ref 3.5–5.2)
Sodium: 143 mmol/L (ref 134–144)
Total Protein: 7.7 g/dL (ref 6.0–8.5)
eGFR: 76 mL/min/{1.73_m2} (ref >59)

## 2021-06-15 LAB — CBC
Hematocrit: 35.8 % (ref 34.0–46.6)
Hemoglobin: 11.8 g/dL (ref 11.1–15.9)
MCH: 31.3 pg (ref 26.6–33.0)
MCHC: 33 g/dL (ref 31.5–35.7)
MCV: 95 fL (ref 79–97)
Platelets: 232 10*3/uL (ref 150–450)
RBC: 3.77 x10E6/uL (ref 3.77–5.28)
RDW: 13.2 % (ref 11.7–15.4)
WBC: 4.3 10*3/uL (ref 3.4–10.8)

## 2021-06-15 LAB — HEMOGLOBIN A1C
Est. average glucose Bld gHb Est-mCnc: 117 mg/dL
Hgb A1c MFr Bld: 5.7 % — ABNORMAL HIGH (ref 4.8–5.6)

## 2021-06-17 ENCOUNTER — Other Ambulatory Visit: Payer: Self-pay

## 2021-06-24 ENCOUNTER — Other Ambulatory Visit: Payer: Self-pay | Admitting: Internal Medicine

## 2021-06-24 DIAGNOSIS — I70219 Atherosclerosis of native arteries of extremities with intermittent claudication, unspecified extremity: Secondary | ICD-10-CM

## 2021-06-24 DIAGNOSIS — Z760 Encounter for issue of repeat prescription: Secondary | ICD-10-CM

## 2021-06-24 DIAGNOSIS — I10 Essential (primary) hypertension: Secondary | ICD-10-CM

## 2021-06-26 ENCOUNTER — Other Ambulatory Visit: Payer: Self-pay | Admitting: Internal Medicine

## 2021-06-26 DIAGNOSIS — I739 Peripheral vascular disease, unspecified: Secondary | ICD-10-CM

## 2021-07-11 ENCOUNTER — Other Ambulatory Visit: Payer: Self-pay

## 2021-07-11 ENCOUNTER — Other Ambulatory Visit: Payer: Self-pay | Admitting: Internal Medicine

## 2021-07-11 DIAGNOSIS — I10 Essential (primary) hypertension: Secondary | ICD-10-CM

## 2021-07-11 DIAGNOSIS — E875 Hyperkalemia: Secondary | ICD-10-CM

## 2021-07-12 ENCOUNTER — Other Ambulatory Visit: Payer: Self-pay

## 2021-07-12 MED ORDER — CARVEDILOL 6.25 MG PO TABS
ORAL_TABLET | Freq: Two times a day (BID) | ORAL | 0 refills | Status: DC
  Filled 2021-07-12: qty 180, 90d supply, fill #0

## 2021-07-12 MED ORDER — POTASSIUM CHLORIDE ER 10 MEQ PO TBCR
20.0000 meq | EXTENDED_RELEASE_TABLET | Freq: Every day | ORAL | 0 refills | Status: DC
  Filled 2021-07-12: qty 60, 30d supply, fill #0

## 2021-07-12 NOTE — Telephone Encounter (Signed)
Requested Prescriptions  ?Pending Prescriptions Disp Refills  ?? carvedilol (COREG) 6.25 MG tablet 180 tablet 0  ?  Sig: TAKE 1 TABLET (6.25 MG TOTAL) BY MOUTH 2 (TWO) TIMES DAILY.  ?  ? Cardiovascular: Beta Blockers 3 Failed - 07/11/2021  3:40 PM  ?  ?  Failed - Last BP in normal range  ?  BP Readings from Last 1 Encounters:  ?06/14/21 (!) 144/87  ?   ?  ?  Passed - Cr in normal range and within 360 days  ?  Creat  ?Date Value Ref Range Status  ?04/17/2016 0.77 0.50 - 1.05 mg/dL Final  ?  Comment:  ?    ?For patients > or = 65 years of age: The upper reference limit for ?Creatinine is approximately 13% higher for people identified as ?African-American. ?  ?  ? ?Creatinine, Ser  ?Date Value Ref Range Status  ?06/14/2021 0.85 0.57 - 1.00 mg/dL Final  ?   ?  ?  Passed - AST in normal range and within 360 days  ?  AST  ?Date Value Ref Range Status  ?06/14/2021 16 0 - 40 IU/L Final  ?   ?  ?  Passed - ALT in normal range and within 360 days  ?  ALT  ?Date Value Ref Range Status  ?06/14/2021 19 0 - 32 IU/L Final  ?   ?  ?  Passed - Last Heart Rate in normal range  ?  Pulse Readings from Last 1 Encounters:  ?06/14/21 70  ?   ?  ?  Passed - Valid encounter within last 6 months  ?  Recent Outpatient Visits   ?      ? 4 weeks ago Essential hypertension  ? [place] [name], MD  ? 5 months ago Essential hypertension  ? [place] [name], MD  ? 8 months ago Encounter for [employer] annual wellness exam  ? [facility] [name], MD  ? 1 year ago Essential hypertension  ? [place] Karle Plumber B, MD  ? 1 year ago Abscess of abdominal wall  ? [place] [name], MD  ?  ?  ? ?  ?  ?  ?? potassium chloride (KLOR-CON) 10 MEQ tablet 60 tablet 0  ?  Sig: Take 2 tablets (20 mEq total) by mouth daily.  ?  ? Endocrinology:  Minerals -  Potassium Supplementation Passed - 07/11/2021  3:40 PM  ?  ?  Passed - K in normal range and within 360 days  ?  Potassium  ?Date Value Ref Range Status  ?06/14/2021 4.0 3.5 - 5.2 mmol/L Final  ?   ?  ?  Passed - Cr in normal range and within 360 days  ?  Creat  ?Date Value Ref Range Status  ?04/17/2016 0.77 0.50 - 1.05 mg/dL Final  ?  Comment:  ?    ?For patients > or = 65 years of age: The upper reference limit for ?Creatinine is approximately 13% higher for people identified as ?African-American. ?  ?  ? ?Creatinine, Ser  ?Date Value Ref Range Status  ?06/14/2021 0.85 0.57 - 1.00 mg/dL Final  ?   ?  ?  Passed - Valid encounter within last 12 months  ?  Recent Outpatient Visits   ?      ? 4 weeks  ago Essential hypertension  ? [place] [name], MD  ? 5 months ago Essential hypertension  ? [place] [name], MD  ? 8 months ago Encounter for [employer] annual wellness exam  ? [facility] [name], MD  ? 1 year ago Essential hypertension  ? [place] Karle Plumber B, MD  ? 1 year ago Abscess of abdominal wall  ? [place] [name], MD  ?  ?  ? ?  ?  ?  ? ?

## 2021-07-16 ENCOUNTER — Other Ambulatory Visit: Payer: Self-pay

## 2021-07-19 ENCOUNTER — Other Ambulatory Visit: Payer: Self-pay | Admitting: Internal Medicine

## 2021-07-19 DIAGNOSIS — Z1231 Encounter for screening mammogram for malignant neoplasm of breast: Secondary | ICD-10-CM

## 2021-08-01 ENCOUNTER — Telehealth: Payer: Self-pay | Admitting: *Deleted

## 2021-08-01 ENCOUNTER — Other Ambulatory Visit: Payer: Self-pay

## 2021-08-01 DIAGNOSIS — I739 Peripheral vascular disease, unspecified: Secondary | ICD-10-CM

## 2021-08-01 NOTE — Telephone Encounter (Signed)
Patient called c/o worsening left lower extremity pain from her ankle radiating to her hip. An appointment was scheduled for ultrasound 08/02/2021 at 4:00 and office visit 08/15/2021 at 10:20 am.  Patient noted and agreed to dates and time.

## 2021-08-02 ENCOUNTER — Ambulatory Visit (HOSPITAL_COMMUNITY)
Admission: RE | Admit: 2021-08-02 | Discharge: 2021-08-02 | Disposition: A | Discharge status: Home / Self Care | Payer: Medicare Other | Source: Ambulatory Visit | Attending: Vascular Surgery | Admitting: Vascular Surgery

## 2021-08-02 DIAGNOSIS — I739 Peripheral vascular disease, unspecified: Secondary | ICD-10-CM | POA: Diagnosis not present

## 2021-08-07 ENCOUNTER — Emergency Department (HOSPITAL_BASED_OUTPATIENT_CLINIC_OR_DEPARTMENT_OTHER): Payer: Medicare Other

## 2021-08-07 ENCOUNTER — Emergency Department (HOSPITAL_COMMUNITY): Payer: Medicare Other

## 2021-08-07 ENCOUNTER — Emergency Department (HOSPITAL_COMMUNITY)
Admission: EM | Admit: 2021-08-07 | Discharge: 2021-08-07 | Disposition: A | Discharge status: Home / Self Care | Payer: Medicare Other | Attending: Emergency Medicine | Admitting: Emergency Medicine

## 2021-08-07 DIAGNOSIS — I700 Atherosclerosis of aorta: Secondary | ICD-10-CM | POA: Diagnosis not present

## 2021-08-07 DIAGNOSIS — J449 Chronic obstructive pulmonary disease, unspecified: Secondary | ICD-10-CM | POA: Diagnosis not present

## 2021-08-07 DIAGNOSIS — Z79899 Other long term (current) drug therapy: Secondary | ICD-10-CM | POA: Insufficient documentation

## 2021-08-07 DIAGNOSIS — I350 Nonrheumatic aortic (valve) stenosis: Secondary | ICD-10-CM | POA: Diagnosis not present

## 2021-08-07 DIAGNOSIS — I10 Essential (primary) hypertension: Secondary | ICD-10-CM | POA: Insufficient documentation

## 2021-08-07 DIAGNOSIS — M79604 Pain in right leg: Secondary | ICD-10-CM

## 2021-08-07 DIAGNOSIS — Z7951 Long term (current) use of inhaled steroids: Secondary | ICD-10-CM | POA: Insufficient documentation

## 2021-08-07 DIAGNOSIS — I70213 Atherosclerosis of native arteries of extremities with intermittent claudication, bilateral legs: Secondary | ICD-10-CM | POA: Diagnosis not present

## 2021-08-07 DIAGNOSIS — I70219 Atherosclerosis of native arteries of extremities with intermittent claudication, unspecified extremity: Secondary | ICD-10-CM | POA: Diagnosis not present

## 2021-08-07 DIAGNOSIS — I745 Embolism and thrombosis of iliac artery: Secondary | ICD-10-CM | POA: Diagnosis not present

## 2021-08-07 DIAGNOSIS — Z7982 Long term (current) use of aspirin: Secondary | ICD-10-CM | POA: Diagnosis not present

## 2021-08-07 DIAGNOSIS — I739 Peripheral vascular disease, unspecified: Secondary | ICD-10-CM

## 2021-08-07 DIAGNOSIS — M79605 Pain in left leg: Secondary | ICD-10-CM

## 2021-08-07 LAB — BASIC METABOLIC PANEL
Anion gap: 9 (ref 5–15)
BUN: 7 mg/dL — ABNORMAL LOW (ref 8–23)
CO2: 24 mmol/L (ref 22–32)
Calcium: 9.3 mg/dL (ref 8.9–10.3)
Chloride: 107 mmol/L (ref 98–111)
Creatinine, Ser: 0.76 mg/dL (ref 0.44–1.00)
GFR, Estimated: >60 mL/min (ref >60)
Glucose, Bld: 98 mg/dL (ref 70–99)
Potassium: 3.4 mmol/L — ABNORMAL LOW (ref 3.5–5.1)
Sodium: 140 mmol/L (ref 135–145)

## 2021-08-07 LAB — CBC WITH DIFFERENTIAL/PLATELET
Abs Immature Granulocytes: 0 10*3/uL (ref 0.00–0.07)
Basophils Absolute: 0.1 10*3/uL (ref 0.0–0.1)
Basophils Relative: 1 %
Eosinophils Absolute: 0.1 10*3/uL (ref 0.0–0.5)
Eosinophils Relative: 1 %
HCT: 37.9 % (ref 36.0–46.0)
Hemoglobin: 12.1 g/dL (ref 12.0–15.0)
Immature Granulocytes: 0 %
Lymphocytes Relative: 41 %
Lymphs Abs: 1.8 10*3/uL (ref 0.7–4.0)
MCH: 31.6 pg (ref 26.0–34.0)
MCHC: 31.9 g/dL (ref 30.0–36.0)
MCV: 99 fL (ref 80.0–100.0)
Monocytes Absolute: 0.5 10*3/uL (ref 0.1–1.0)
Monocytes Relative: 13 %
Neutro Abs: 1.9 10*3/uL (ref 1.7–7.7)
Neutrophils Relative %: 44 %
Platelets: 251 10*3/uL (ref 150–400)
RBC: 3.83 MIL/uL — ABNORMAL LOW (ref 3.87–5.11)
RDW: 13.1 % (ref 11.5–15.5)
WBC: 4.3 10*3/uL (ref 4.0–10.5)
nRBC: 0 % (ref 0.0–0.2)

## 2021-08-07 LAB — CK: Total CK: 139 U/L (ref 38–234)

## 2021-08-07 MED ORDER — LACTATED RINGERS IV BOLUS
500.0000 mL | Freq: Once | INTRAVENOUS | Status: AC
  Administered 2021-08-07: 500 mL via INTRAVENOUS

## 2021-08-07 MED ORDER — ONDANSETRON 4 MG PO TBDP: 4.0000 mg | ORAL_TABLET | Freq: Three times a day (TID) | ORAL | 0 refills | Status: DC | PRN

## 2021-08-07 MED ORDER — IOHEXOL 350 MG/ML SOLN
100.0000 mL | Freq: Once | INTRAVENOUS | Status: AC | PRN
  Administered 2021-08-07: 100 mL via INTRAVENOUS

## 2021-08-07 MED ORDER — ONDANSETRON 4 MG PO TBDP
4.0000 mg | ORAL_TABLET | Freq: Once | ORAL | Status: AC
  Administered 2021-08-07: 4 mg via ORAL
  Filled 2021-08-07: qty 1

## 2021-08-07 MED ORDER — MELOXICAM 7.5 MG PO TABS
15.0000 mg | ORAL_TABLET | Freq: Once | ORAL | Status: DC
Start: 2021-08-07 — End: 2021-08-07
  Filled 2021-08-07: qty 2

## 2021-08-07 MED ORDER — MAGNESIUM OXIDE -MG SUPPLEMENT 400 (240 MG) MG PO TABS
800.0000 mg | ORAL_TABLET | Freq: Once | ORAL | Status: AC
  Administered 2021-08-07: 800 mg via ORAL
  Filled 2021-08-07: qty 2

## 2021-08-07 MED ORDER — OXYCODONE-ACETAMINOPHEN 5-325 MG PO TABS
1.0000 | ORAL_TABLET | Freq: Once | ORAL | Status: AC
  Administered 2021-08-07: 1 via ORAL
  Filled 2021-08-07: qty 1

## 2021-08-07 MED ORDER — POTASSIUM CHLORIDE CRYS ER 20 MEQ PO TBCR
40.0000 meq | EXTENDED_RELEASE_TABLET | Freq: Once | ORAL | Status: AC
  Administered 2021-08-07: 40 meq via ORAL
  Filled 2021-08-07: qty 2

## 2021-08-07 MED ORDER — OXYCODONE-ACETAMINOPHEN 5-325 MG PO TABS: 1.0000 | ORAL_TABLET | Freq: Three times a day (TID) | ORAL | 0 refills | Status: DC | PRN

## 2021-08-07 NOTE — ED Notes (Signed)
Pt returned from CT scan.

## 2021-08-07 NOTE — ED Notes (Signed)
Pt to vascular.

## 2021-08-07 NOTE — ED Notes (Signed)
Pt to CT scan.

## 2021-08-07 NOTE — ED Provider Notes (Signed)
[facility] EMERGENCY DEPARTMENT Provider Note   CSN: [id number] Arrival date & time: 08/07/21  0831     History  Chief Complaint  Patient presents with   Leg Pain    Brandy Stevenson is a 66 y.o. female.   Leg Pain Associated symptoms: back pain   Patient presents for bilateral leg pain.  Medical history includes HTN, GERD, COPD, PVD with intermittent claudication, HLD, anxiety, depression, arthritis, chronic low back pain.  She is followed by vascular surgery (Dr. Scot Dock) for her PAD.  She states that she has had worsening of her pain last week. She was seen for an ultrasound study on Friday.  Per chart review, this was an ABI which showed no worsening from prior study.  Over the weekend, she traveled to her hometown which is a 2-hour drive.  She returned on Sunday.  Pain has continued to worsen.  Pain has progressed to the point where is difficult for her to walk.  For her pain, she takes meloxicam, Tylenol, and gabapentin.  She has also tried a topical freezing medication on her left thigh as well as left lower back, which is also had worsened pain.  She was advised to take meloxicam Monday, Wednesday, and Friday.  She did not take any medications today.    Home Medications Prior to Admission medications   Medication Sig Start Date End Date Taking? Authorizing Provider  acetaminophen (TYLENOL) 500 MG tablet Take 1,000 mg by mouth every 8 (eight) hours.   Yes [provider]  albuterol (PROAIR HFA) 108 (90 Base) MCG/ACT inhaler Inhale 2 puffs into the lungs every 6 (six) hours as needed for wheezing or shortness of breath. Patient taking differently: Inhale 2 puffs into the lungs. 2 puffs 2-4 times a day as needed for wheezing 06/14/21  Yes [name], MD  amLODipine (NORVASC) 10 MG tablet TAKE 1 TABLET BY MOUTH AT  BEDTIME Patient taking differently: Take 10 mg by mouth daily. 06/24/21  Yes [name], MD  aspirin EC 81 MG tablet Take  1 tablet (81 mg total) by mouth daily. 10/03/16  Yes Funches, Josalyn, MD  atorvastatin (LIPITOR) 40 MG tablet TAKE 1 TABLET BY MOUTH  DAILY Patient taking differently: Take 40 mg by mouth at bedtime. 06/24/21  Yes [name], MD  carvedilol (COREG) 6.25 MG tablet TAKE 1 TABLET (6.25 MG TOTAL) BY MOUTH 2 (TWO) TIMES DAILY. Patient taking differently: Take 6.25 mg by mouth 2 (two) times daily. 07/12/21 07/12/22 Yes [name], MD  cilostazol (PLETAL) 50 MG tablet TAKE 1 TABLET BY MOUTH TWICE  DAILY Patient taking differently: Take 50 mg by mouth 2 (two) times daily. 06/26/21  Yes [name], MD  fluticasone-salmeterol (ADVAIR) 250-50 MCG/ACT AEPB Inhale 1 puff into the lungs in the morning and at bedtime. 06/14/21  Yes [name], MD  gabapentin (NEURONTIN) 300 MG capsule TAKE 1 CAPSULE BY MOUTH AT  BEDTIME Patient taking differently: Take 300 mg by mouth at bedtime. 06/24/21  Yes [name], MD  hydrALAZINE (APRESOLINE) 25 MG tablet Take 1 tablet (25 mg total) by mouth 3 (three) times daily. Patient taking differently: Take 25 mg by mouth in the morning and at bedtime. 06/14/21  Yes [name], MD  lisinopril (ZESTRIL) 40 MG tablet TAKE 1 TABLET BY MOUTH  DAILY Patient taking differently: Take 40 mg by mouth daily. 06/24/21  Yes [name], MD  meloxicam (MOBIC) 15 MG tablet TAKE  1 TABLET BY MOUTH  DAILY Patient taking differently: Take 15 mg by mouth daily. 06/24/21  Yes [name], MD  Menthol, Topical Analgesic, (BIOFREEZE EX) Apply 1 patch topically once.   Yes [provider]  Menthol, Topical Analgesic, (BIOFREEZE ROLL-ON EX) Apply 1 application. topically at bedtime as needed (back pain).   Yes [provider]  nystatin (MYCOSTATIN) 100000 UNIT/ML suspension TAKE 5 MLS (500,000 UNITS TOTAL) BY MOUTH 4 (FOUR) TIMES DAILY. SWISH AND SWALLOW Patient taking differently: Take 5 mLs by mouth 4 (four) times daily. 06/14/21  Yes  [name], MD  omeprazole (PRILOSEC) 40 MG capsule TAKE 1 CAPSULE BY MOUTH  DAILY Patient taking differently: Take 40 mg by mouth daily. 06/24/21  Yes [name], MD  ondansetron (ZOFRAN-ODT) 4 MG disintegrating tablet Take 1 tablet (4 mg total) by mouth every 8 (eight) hours as needed for nausea or vomiting. 08/07/21  Yes Godfrey Pick, MD  oxyCODONE-acetaminophen (PERCOCET/ROXICET) 5-325 MG tablet Take 1 tablet by mouth every 8 (eight) hours as needed for up to 15 doses for severe pain. 08/07/21  Yes Godfrey Pick, MD  potassium chloride (KLOR-CON) 10 MEQ tablet Take 2 tablets (20 mEq total) by mouth daily. Patient taking differently: Take 10 mEq by mouth daily. 07/12/21  Yes [name], MD  spironolactone (ALDACTONE) 25 MG tablet Take 1 tablet (25 mg total) by mouth daily. 06/14/21  Yes [name], MD      Allergies    Vicodin [hydrocodone-acetaminophen], Bactrim [sulfamethoxazole-trimethoprim], Incruse ellipta [umeclidinium bromide], Tramadol, and Wellbutrin [bupropion]    Review of Systems   Review of Systems  Musculoskeletal:  Positive for back pain, gait problem and myalgias.  All other systems reviewed and are negative.  Physical Exam Updated Vital Signs BP 132/75 (BP Location: Left Arm)   Pulse (!) 53   Temp 97.9 F (36.6 C) (Oral)   Resp 18   SpO2 96%  Physical Exam Vitals and nursing note reviewed.  Constitutional:      General: She is not in acute distress.    Appearance: Normal appearance. She is well-developed. She is not ill-appearing, toxic-appearing or diaphoretic.  HENT:     Head: Normocephalic and atraumatic.     Right Ear: External ear normal.     Left Ear: External ear normal.     Nose: Nose normal.     Mouth/Throat:     Mouth: Mucous membranes are moist.     Pharynx: Oropharynx is clear.  Eyes:     Extraocular Movements: Extraocular movements intact.     Conjunctiva/sclera: Conjunctivae normal.  Cardiovascular:     Rate and  Rhythm: Normal rate and regular rhythm.  Pulmonary:     Effort: Pulmonary effort is normal. No respiratory distress.  Abdominal:     Palpations: Abdomen is soft.     Tenderness: There is no abdominal tenderness.  Musculoskeletal:        General: No swelling, tenderness or deformity. Normal range of motion.     Cervical back: Normal range of motion and neck supple.     Right lower leg: No edema.     Left lower leg: No edema.  Skin:    General: Skin is warm and dry.     Capillary Refill: Capillary refill takes less than 2 seconds.     Coloration: Skin is not jaundiced or pale.  Neurological:     General: No focal deficit present.     Mental Status: She is alert and  oriented to person, place, and time.     Cranial Nerves: No cranial nerve deficit.     Sensory: No sensory deficit.     Motor: No weakness.     Coordination: Coordination normal.  Psychiatric:        Mood and Affect: Mood normal.        Behavior: Behavior normal.        Thought Content: Thought content normal.        Judgment: Judgment normal.    ED Results / Procedures / Treatments   Labs (all labs ordered are listed, but only abnormal results are displayed) Labs Reviewed  CBC WITH DIFFERENTIAL/PLATELET - Abnormal; Notable for the following components:      Result Value   RBC 3.83 (*)    All other components within normal limits  BASIC METABOLIC PANEL - Abnormal; Notable for the following components:   Potassium 3.4 (*)    BUN 7 (*)    All other components within normal limits  CK    EKG None  Radiology CT Angio Aortobifemoral W and/or Wo Contrast  Result Date: 08/07/2021 CLINICAL DATA:  Claudication, leg pain EXAM: CT ANGIOGRAPHY OF ABDOMINAL AORTA WITH ILIOFEMORAL RUNOFF TECHNIQUE: Multidetector CT imaging of the abdomen, pelvis and lower extremities was performed using the standard protocol during bolus administration of intravenous contrast. Multiplanar CT image reconstructions and MIPs were obtained to  evaluate the vascular anatomy. RADIATION DOSE REDUCTION: This exam was performed according to the departmental dose-optimization program which includes automated exposure control, adjustment of the mA and/or kV according to patient size and/or use of iterative reconstruction technique. CONTRAST:  184m OMNIPAQUE IOHEXOL 350 MG/ML SOLN COMPARISON:  10/17/2017 FINDINGS: VASCULAR Aorta: Extensive calcified atheromatous plaque. No aneurysm. There is stenosis of the infrarenal aorta which occludes just above the bifurcation. Celiac: Patent without evidence of aneurysm, dissection, vasculitis or significant stenosis. SMA: Patent without evidence of aneurysm, dissection, vasculitis or significant stenosis. Renals: Bilateral calcified ostial plaque without high-grade stenosis. IMA: Patent without evidence of aneurysm, dissection, vasculitis or significant stenosis. RIGHT Lower Extremity Inflow: Common iliac long segment occlusion Internal iliac collateral reconstitution External iliac heavily atheromatous in its proximal segment, patent through its mid and distal segments which are diminutive in caliber Outflow: Common femoral reconstitution by collaterals Deep femoral branches patent SFA distal calcified plaque without high-grade stenosis Popliteal patent Runoff: Anterior tibial dominant runoff, patent as dorsalis pedis distally. Peroneal and posterior tibial origin occlusion. The peroneal is reconstituted in the mid calf, in the posterior tibial in the distal calf LEFT Lower Extremity Inflow: Common iliac heavily atheromatous with origin occlusion extending through its midportion,, reconstituted distally Internal iliac patent, atheromatous External iliac atheromatous distally, but patent Outflow: Common femoral mildly atheromatous but patent Deep femoral branches patent SFA mildly atheromatous distally, but widely patent Popliteal has calcified plaque proximally, but widely patent Runoff: Patent three vessel runoff to the  ankle. Veins: No obvious venous abnormality within the limitations of this arterial phase study. Review of the MIP images confirms the above findings. NON-VASCULAR Lower chest: No pleural or pericardial effusion.  Lung bases clear. Hepatobiliary: No focal liver abnormality is seen. No gallstones, gallbladder wall thickening, or biliary dilatation. Pancreas: Unremarkable. No pancreatic ductal dilatation or surrounding inflammatory changes. Spleen: Normal in size without focal abnormality. Adrenals/Urinary Tract: Adrenal glands are unremarkable. Kidneys are normal, without renal calculi, focal lesion, or hydronephrosis. Bladder is unremarkable. Stomach/Bowel: Stomach is nondistended, unremarkable. Small bowel decompressed. Post appendectomy. Colon is nondilated, unremarkable. Lymphatic: No abdominal  or pelvic adenopathy. Reproductive: Uterus and bilateral adnexa are unremarkable. Other: No ascites.  No free air. Musculoskeletal: No acute or significant osseous findings. IMPRESSION: 1. No acute findings. 2. Chronic infrarenal aortic occlusion extending into bilateral common iliac arteries. 3. Stable collateral reconstitution of bilateral lower extremity outflow, only minimally atheromatous. 4. Right anterior tibial runoff, patent left three-vessel runoff. Electronically Signed   By: Lucrezia Europe M.D.   On: 08/07/2021 15:05   VAS [place] LOWER EXTREMITY VENOUS (DVT) (7a-7p)  Result Date: 08/07/2021  Lower Venous DVT Study Patient Name:  Brandy Stevenson [facility]  Date of Exam:   08/07/2021 Medical Rec #: [id number]                Accession #:    [id number] Date of Birth: Jan 26, 1956                Patient Gender: F Patient Age:   52 years Exam Location:  [facility] Procedure:      VAS [place] LOWER EXTREMITY VENOUS (DVT) Referring Phys: Godfrey Pick --------------------------------------------------------------------------------  Indications: Pain.  Limitations: Poor ultrasound/tissue interface. Comparison Study: No previous  venous exams Performing Technologist: Jody Hill RVT, RDMS  Examination Guidelines: A complete evaluation includes B-mode imaging, spectral Doppler, color Doppler, and power Doppler as needed of all accessible portions of each vessel. Bilateral testing is considered an integral part of a complete examination. Limited examinations for reoccurring indications may be performed as noted. The reflux portion of the exam is performed with the patient in reverse Trendelenburg.  +--------+---------------+---------+-----------+----------+--------------------+ RIGHT   CompressibilityPhasicitySpontaneityPropertiesThrombus Aging       +--------+---------------+---------+-----------+----------+--------------------+ CFV     Full           Yes      Yes                                       +--------+---------------+---------+-----------+----------+--------------------+ SFJ     Full                                                              +--------+---------------+---------+-----------+----------+--------------------+ FV Prox Full           Yes      Yes                                       +--------+---------------+---------+-----------+----------+--------------------+ FV Mid  Full           Yes      Yes                                       +--------+---------------+---------+-----------+----------+--------------------+ FV      Full           Yes      Yes                                       Distal                                                                    +--------+---------------+---------+-----------+----------+--------------------+  PFV                    Yes      Yes                  patent by                                                                 color/doppler        +--------+---------------+---------+-----------+----------+--------------------+ POP     Full           Yes      Yes                                        +--------+---------------+---------+-----------+----------+--------------------+ PTV     Full                                                              +--------+---------------+---------+-----------+----------+--------------------+ PERO    Full                                                              +--------+---------------+---------+-----------+----------+--------------------+   +--------+---------------+---------+-----------+----------+--------------------+ LEFT    CompressibilityPhasicitySpontaneityPropertiesThrombus Aging       +--------+---------------+---------+-----------+----------+--------------------+ CFV     Full           No       Yes                                       +--------+---------------+---------+-----------+----------+--------------------+ SFJ     Full                                                              +--------+---------------+---------+-----------+----------+--------------------+ FV Prox Full           Yes      Yes                                       +--------+---------------+---------+-----------+----------+--------------------+ FV Mid  Full           Yes      Yes                                       +--------+---------------+---------+-----------+----------+--------------------+ FV      Full  Yes      Yes                                       Distal                                                                    +--------+---------------+---------+-----------+----------+--------------------+ PFV                    Yes      Yes                  patent by                                                                 color/doppler        +--------+---------------+---------+-----------+----------+--------------------+ POP     Full           Yes      Yes                                       +--------+---------------+---------+-----------+----------+--------------------+  PTV     Full                                                              +--------+---------------+---------+-----------+----------+--------------------+ PERO    Full                                                              +--------+---------------+---------+-----------+----------+--------------------+    Summary: BILATERAL: - No evidence of deep vein thrombosis seen in the lower extremities, bilaterally. -No evidence of popliteal cyst, bilaterally.   *See table(s) above for measurements and observations.    Preliminary     Procedures Procedures    Medications Ordered in ED Medications  meloxicam (MOBIC) tablet 15 mg (15 mg Oral Not Given 08/07/21 1155)  oxyCODONE-acetaminophen (PERCOCET/ROXICET) 5-325 MG per tablet 1 tablet (1 tablet Oral Given 08/07/21 0926)  ondansetron (ZOFRAN-ODT) disintegrating tablet 4 mg (4 mg Oral Given 08/07/21 0927)  lactated ringers bolus 500 mL (0 mLs Intravenous Stopped 08/07/21 1203)  potassium chloride SA (KLOR-CON M) CR tablet 40 mEq (40 mEq Oral Given 08/07/21 1203)  magnesium oxide (MAG-OX) tablet 800 mg (800 mg Oral Given 08/07/21 1204)  oxyCODONE-acetaminophen (PERCOCET/ROXICET) 5-325 MG per tablet 1 tablet (1 tablet Oral Given 08/07/21 1304)  iohexol (OMNIPAQUE) 350 MG/ML injection 100 mL (100 mLs Intravenous Contrast Given 08/07/21 1438)    ED Course/ Medical  Decision Making/ A&P                           Medical Decision Making Amount and/or Complexity of Data Reviewed Labs: ordered. Radiology: ordered.  Risk OTC drugs. Prescription drug management.   This patient presents to the ED for concern of bilateral leg pain, this involves an extensive number of treatment options, and is a complaint that carries with it a high risk of complications and morbidity.  The differential diagnosis includes DVT, myositis, worsening symptoms of known PAD with claudication   Co morbidities that complicate the patient evaluation  HTN, GERD, COPD, PVD with  intermittent claudication, HLD, anxiety, depression, arthritis, chronic low back pain   Additional history obtained:  Additional history obtained from N/A External records from outside source obtained and reviewed including EMR   Lab Tests:  I Ordered, and personally interpreted labs.  The pertinent results include: Normal hemoglobin, no leukocytosis, normal CK, slightly low potassium but otherwise normal electrolytes   Imaging Studies ordered:  I ordered imaging studies including bilateral DVT study, CTA aortobifemoral with runoff I independently visualized and interpreted imaging which showed no acute findings I agree with the radiologist interpretation   Problem List / ED Course / Critical interventions / Medication management  Patient is a 66 year old female with known PAD with claudication, followed by vascular surgery (Dr. Scot Dock), presenting for worsened bilateral lower extremity pain over the past several days.  For her pain, she takes Mobic every other day.  She has been taking Tylenol.  Pain has been so severe that it is limited her ability to walk.  For this reason, she presents to the ED.  On arrival in the ED, vital signs are normal.  Patient is well-appearing.  She describes the pain as a deep ache.  Patient does not have significant tenderness or swelling to her lower extremities.  Distal pulses are present and extremities are warm to touch.  Patient was given Percocet for analgesia.  She has a history of nausea and vomiting from narcotic pain medication so Zofran was given as well.  Laboratory work-up was initiated and patient underwent DVT study of bilateral lower extremities.  No DVTs were identified.  Patient's lab was notable for slight hypokalemia.  Doses of potassium chloride and magnesium oxide were given for optimization of electrolyte levels.  Patient underwent CTA aortobifemoral with runoff which showed chronic PAD with no new changes.  Patient did have relief of  pain with the Percocet.  She currently has a vascular surgery appointment scheduled for next week.  Patient was provided short course of Percocet to take at home as needed to get her to her next vascular surgery appointment, where she can discuss further options of management.  Patient was discharged in stable condition. I ordered medication including Percocet for analgesia; potassium chloride and magnesium oxide for hypokalemia Reevaluation of the patient after these medicines showed that the patient improved I have reviewed the patients home medicines and have made adjustments as needed   Social Determinants of Health:  Has access to outpatient care         Final Clinical Impression(s) / ED Diagnoses Final diagnoses:  PAD (peripheral artery disease) ([place])  Claudication in peripheral vascular disease ([place])    Rx / DC Orders ED Discharge Orders          Ordered    oxyCODONE-acetaminophen (PERCOCET/ROXICET) 5-325 MG tablet  Every 8 hours PRN  08/07/21 1515    ondansetron (ZOFRAN-ODT) 4 MG disintegrating tablet  Every 8 hours PRN        08/07/21 1515              Godfrey Pick, MD 08/07/21 [phone]

## 2021-08-07 NOTE — ED Notes (Signed)
Pt verbalized understanding of d/c instructions, prescriptions and follow up care. Pt reports her husband will be driving her home.

## 2021-08-07 NOTE — ED Triage Notes (Signed)
Pt. Stated, Im having leg pain both legs I cant hardly walk.

## 2021-08-07 NOTE — Discharge Instructions (Signed)
Continue to take your home medications for management of your peripheral arterial disease and claudication.  Additionally, a prescription for narcotic pain medication was sent to your pharmacy.  Take this only as needed.  There is a separate prescription that was sent for a medication to treat nausea.  Ensure that you keep your follow-up appointment with vascular surgery next week to discuss your ongoing symptoms and further treatment options.  Return to the emergency department at any time for any new or worsening symptoms of concern.

## 2021-08-07 NOTE — Progress Notes (Signed)
BLE venous duplex has been completed.  Preliminary results messaged to Dr. Doren Custard.   Results can be found under chart review under CV PROC. 08/07/2021 11:35 AM Brenin Heidelberger RVT, RDMS

## 2021-08-12 NOTE — Progress Notes (Signed)
[facility]           OF [place]  History and Physical   Brandy Stevenson is a 66 y.o. female who has been seen by Dr. Scot Dock in the past for PVD as well as BLE swelling with left > right.  She was last seen by him in November 2022 and at that time she had stable claudication without rest pain or non healing wounds.  She did have a reflux study and it was negative reflux.  He discussed the importance of leg elevation and encouraged her to stay as active as possible.    She was seen in the ER on 08/07/2021 for bilateral leg pain.  She did have a DVT study, which was negative.  She also had a CTA with runoff, which revealed chronic infrafenal aortic occlusion extending into the bilateral CIA.  She did have a CTA in 2019 that also revealed occlusion of the distal aspect of the abdominal aorta.   She had an angiogram in 2017 that also revealed an occluded aorta and CIA with reconstitution of her EIA.  She was seen again in 2019 and Dr. Scot Dock felt that given her sx stable, would continue with conservative management but if they progressed, would consider axillary bifemoral bypass grafting vs aortobifemoral bypass grafting.    Pt comes in today with c/o left leg pain.  She states that she was doing well prior to a couple of weeks ago.  She states that she was visiting with her aunt after her hysterectomy and she had a walking program around the house so when she would go back to visit, she would walk with her.  After one lap, she got cramping in both calves that was unchanged and she would rest and it would resolve.  She states when she went back the next weekend, they walked 4 laps and she rested after 2 with the same results.  A couple of weeks ago, she started having left leg pain.  She states that she has pain from her left hip down and around her left foot up to her knee.  She states that this has gotten worse.  She states that when she is laying in bed at night,  she hangs her leg off the bed and it does feel better but after a while, it starts to cramp.  She states that if she wraps it with a blanket, that also makes it feel better.  She does not have any non healing wounds.   She states that she has seen someone for her back in the past and they were more worried about her neck than her lower back.  She states that the pain was so bad last week, she went to the ER.  She had a CTA with runoff and it revealed it was essentially unchanged from previous study.  She states they gave her some percocet and this does help with the pain some.  She is also using Tylenol.  Her DVT study in the ER was negative.   She has not see her cardiologist in a couple of years.  She denies any chest pain or shortness of breath.   The pt is on a statin for cholesterol management.  The pt is on a daily aspirin.   Other AC:  Pletal The pt is on CCB, BB, ACEI, hydralazine, diuretic for hypertension.   The pt is not diabetic.   Tobacco hx:  former   Past Medical History:  Diagnosis Date   Arthritis    back problems to be evaluated by neurology next month   Cancer ([place]) 2009   L, Breast, radiation and surgery.   COPD (chronic obstructive pulmonary disease) (HCC)    Depression    GERD (gastroesophageal reflux disease)    Hyperlipemia    Hypertension    Leriche syndrome (HCC)    Infrarenal aortic occlusion extending to bilateral internal iliac arteries.  External iliacs fill via collaterals from lumbar arteries to hypogastric/internal iliac arteries -minimal disease on the right side and no disease on the left side leg arteries distally.   Peripheral vascular disease ([place]) 2013    Past Surgical History:  Procedure Laterality Date   APPENDECTOMY  1980   BREAST LUMPECTOMY Left    s/p radiation therapy    BREAST SURGERY     COLONOSCOPY  07/2015   COLONOSCOPY WITH PROPOFOL N/A 06/26/2015   Procedure: COLONOSCOPY WITH PROPOFOL;  Surgeon: [name], MD;  Location: [facility]  ENDOSCOPY;  Service: Endoscopy;  Laterality: N/A;   CT CTA CORONARY W/CA SCORE W/CM &/OR WO/CM  12/10/2017   Coronary calcium score is 0.  No evidence of CAD.   PERIPHERAL VASCULAR CATHETERIZATION N/A 10/22/2015   Procedure: Abdominal Aortogram w/Lower Extremity;  Surgeon: [name], MD;  Location: [facility] INVASIVE CV LAB: Aortic arch widely patent as far as innominate artery, right subclavian artery, right vertebral artery and right, common carotid.  Bilateral single renal arteries - Nonocclusive disease. Infrarenal aorta 100% - bilateral common Iliac 100% --> B Ex Iliacs fill via Int Iliac. Leg OK   TRANSTHORACIC ECHOCARDIOGRAM  08/2017   EF 60-65%. No RWMA.  Normal DF.  Normal valves.    TUBAL LIGATION  1991    Social History   Socioeconomic History   Marital status: Married    Spouse name: Not on file   Number of children: Not on file   Years of education: Not on file   Highest education level: Not on file  Occupational History   Not on file  Tobacco Use   Smoking status: Former    Packs/day: 0.25    Years: 40.00    Total pack years: 10.00    Types: Cigarettes    Quit date: 02/08/2017    Years since quitting: 4.5   Smokeless tobacco: Never  Vaping Use   Vaping Use: Never used  Substance and Sexual Activity   Alcohol use: No   Drug use: No   Sexual activity: Yes  Other Topics Concern   Not on file  Social History Narrative   Not on file   Social Determinants of Health   Financial Resource Strain: Not on file  Food Insecurity: Not on file  Transportation Needs: Not on file  Physical Activity: Not on file  Stress: Not on file  Social Connections: Not on file  Intimate Partner Violence: Not on file     Family History  Problem Relation Age of Onset   Hyperlipidemia Mother    Hypertension Mother    Heart disease Mother        Atrial Fib.   Hyperlipidemia Sister    Hypertension Sister    Heart disease Sister        Before age 66,  CHF   Heart attack  Brother    Hyperlipidemia Brother    Hypertension Brother    Heart disease Brother    Hypertension Daughter     Current Outpatient  Medications  Medication Sig Dispense Refill   acetaminophen (TYLENOL) 500 MG tablet Take 1,000 mg by mouth every 8 (eight) hours.     albuterol (PROAIR HFA) 108 (90 Base) MCG/ACT inhaler Inhale 2 puffs into the lungs every 6 (six) hours as needed for wheezing or shortness of breath. (Patient taking differently: Inhale 2 puffs into the lungs. 2 puffs 2-4 times a day as needed for wheezing) 8.5 g 5   amLODipine (NORVASC) 10 MG tablet TAKE 1 TABLET BY MOUTH AT  BEDTIME (Patient taking differently: Take 10 mg by mouth daily.) 100 tablet 2   aspirin EC 81 MG tablet Take 1 tablet (81 mg total) by mouth daily. 90 tablet 3   atorvastatin (LIPITOR) 40 MG tablet TAKE 1 TABLET BY MOUTH  DAILY (Patient taking differently: Take 40 mg by mouth at bedtime.) 100 tablet 2   carvedilol (COREG) 6.25 MG tablet TAKE 1 TABLET (6.25 MG TOTAL) BY MOUTH 2 (TWO) TIMES DAILY. (Patient taking differently: Take 6.25 mg by mouth 2 (two) times daily.) 180 tablet 0   cilostazol (PLETAL) 50 MG tablet TAKE 1 TABLET BY MOUTH TWICE  DAILY (Patient taking differently: Take 50 mg by mouth 2 (two) times daily.) 180 tablet 3   fluticasone-salmeterol (ADVAIR) 250-50 MCG/ACT AEPB Inhale 1 puff into the lungs in the morning and at bedtime. 60 each 6   gabapentin (NEURONTIN) 300 MG capsule TAKE 1 CAPSULE BY MOUTH AT  BEDTIME (Patient taking differently: Take 300 mg by mouth at bedtime.) 90 capsule 0   hydrALAZINE (APRESOLINE) 25 MG tablet Take 1 tablet (25 mg total) by mouth 3 (three) times daily. (Patient taking differently: Take 25 mg by mouth in the morning and at bedtime.) 180 tablet 6   lisinopril (ZESTRIL) 40 MG tablet TAKE 1 TABLET BY MOUTH  DAILY (Patient taking differently: Take 40 mg by mouth daily.) 100 tablet 2   meloxicam (MOBIC) 15 MG tablet TAKE 1 TABLET BY MOUTH  DAILY (Patient taking  differently: Take 15 mg by mouth daily.) 90 tablet 0   Menthol, Topical Analgesic, (BIOFREEZE EX) Apply 1 patch topically once.     Menthol, Topical Analgesic, (BIOFREEZE ROLL-ON EX) Apply 1 application. topically at bedtime as needed (back pain).     nystatin (MYCOSTATIN) 100000 UNIT/ML suspension TAKE 5 MLS (500,000 UNITS TOTAL) BY MOUTH 4 (FOUR) TIMES DAILY. SWISH AND SWALLOW (Patient taking differently: Take 5 mLs by mouth 4 (four) times daily.) 100 mL 0   omeprazole (PRILOSEC) 40 MG capsule TAKE 1 CAPSULE BY MOUTH  DAILY (Patient taking differently: Take 40 mg by mouth daily.) 90 capsule 0   ondansetron (ZOFRAN-ODT) 4 MG disintegrating tablet Take 1 tablet (4 mg total) by mouth every 8 (eight) hours as needed for nausea or vomiting. 20 tablet 0   oxyCODONE-acetaminophen (PERCOCET/ROXICET) 5-325 MG tablet Take 1 tablet by mouth every 8 (eight) hours as needed for up to 15 doses for severe pain. 15 tablet 0   potassium chloride (KLOR-CON) 10 MEQ tablet Take 2 tablets (20 mEq total) by mouth daily. (Patient taking differently: Take 10 mEq by mouth daily.) 60 tablet 0   spironolactone (ALDACTONE) 25 MG tablet Take 1 tablet (25 mg total) by mouth daily. 90 tablet 3   No current facility-administered medications for this visit.    Allergies  Allergen Reactions   Vicodin [Hydrocodone-Acetaminophen] Nausea And Vomiting and Other (See Comments)    Sweating   Bactrim [Sulfamethoxazole-Trimethoprim]     HA and vomiting   Incruse Ellipta [Umeclidinium  Bromide] Other (See Comments)    Dry mouth   Tramadol Nausea And Vomiting   Wellbutrin [Bupropion] Other (See Comments)    Made me feel bad and sleepy    REVIEW OF SYSTEMS:   '[X]'$  denotes positive finding, '[ ]'$  denotes negative finding Cardiac  Comments:  Chest pain or chest pressure:    Shortness of breath upon exertion:    Short of breath when lying flat:    Irregular heart rhythm:        Vascular    Pain in calf, thigh, or hip brought on  by ambulation: x   Pain in feet at night that wakes you up from your sleep:  x   Blood clot in your veins:    Leg swelling:  x       Pulmonary    Oxygen at home:    Productive cough:     Wheezing:         Neurologic    Sudden weakness in arms or legs:     Sudden numbness in arms or legs:     Sudden onset of difficulty speaking or slurred speech:    Temporary loss of vision in one eye:     Problems with dizziness:         Gastrointestinal    Blood in stool:     Vomited blood:         Genitourinary    Burning when urinating:     Blood in urine:        Psychiatric    Major depression:         Hematologic    Bleeding problems:    Problems with blood clotting too easily:        Skin    Rashes or ulcers:        Constitutional    Fever or chills:      PHYSICAL EXAMINATION:  Today's Vitals   08/15/21 1015  BP: 130/85  Pulse: 63  Resp: 20  Temp: 97.9 F (36.6 C)  TempSrc: Temporal  SpO2: 95%  Weight: 189 lb 4.8 oz (85.9 kg)  Height: '5\' 5"'$  (1.651 m)  PainSc: 10-Worst pain ever   Body mass index is 31.5 kg/m.   General:  WDWN in NAD; vital signs documented above Gait: Not observed HENT: WNL, normocephalic Pulmonary: normal non-labored breathing without wheezing Cardiac: regular HR; without carotid bruits Abdomen: soft, NT, no masses; aortic pulse is not palpable Skin: without rashes Vascular Exam/Pulses: Unable to palpate femoral pulses but left more brisk than right Brisk left DP doppler signal Monophasic right AT and faint PT doppler signals Extremities: motor and sensory are in tact bilateral feet.   Neurologic: A&O X 3;  moving all extremities equally Psychiatric:  The pt has Normal affect.   Non-Invasive Vascular Imaging:   Venous duplex on 08/07/2021: No evidence of DVT  ABI 08/02/2021: Right:  0.55/0.18 great toe pressure 26 Left:  0.68/0.33 great toe pressure 47  Previous ABI 05/16/2020: Right:  0.59/0.42 great toe pressure 54 Left:   0.66/0.51 great toe pressure 65   Brandy Stevenson is a 66 y.o. female who presents with: hx of PAD  with new left leg pain that has been present for a couple of weeks.  -pt has stable bilateral claudication.  Her ABI are essentially unchanged from March 2022 but her toe pressures are decreased.  She does describe rest pain with having to hang her leg off the bed at night.  She does  not have non healing sores.  Motor and sensory are in tact.  She had a CTA with runoff last week in the ER, which revealed stable chronic aortic occlusion.  I discussed pt with Dr. Scot Dock and he reviewed her CTA.  Will have her undergo cardiac clearance.  She will also have BUE arterial duplex in case she has to undergo axillary to femoral bypass vs aortobifemoral bypass vs conservative treatment.  She will see Dr. Scot Dock back in a couple of weeks to determine next steps.  She is in agreement with this plan.     Leontine Locket, [facility] 08/12/2021 1:09 PM  Clinic MD:  Scot Dock

## 2021-08-15 ENCOUNTER — Other Ambulatory Visit: Payer: Self-pay | Admitting: Internal Medicine

## 2021-08-15 ENCOUNTER — Ambulatory Visit (INDEPENDENT_AMBULATORY_CARE_PROVIDER_SITE_OTHER): Payer: Medicare Other | Admitting: Physician Assistant

## 2021-08-15 VITALS — BP 130/85 | HR 63 | Temp 97.9°F | Resp 20 | Ht 65.0 in | Wt 189.3 lb

## 2021-08-15 DIAGNOSIS — I7409 Other arterial embolism and thrombosis of abdominal aorta: Secondary | ICD-10-CM | POA: Diagnosis not present

## 2021-08-15 DIAGNOSIS — E875 Hyperkalemia: Secondary | ICD-10-CM

## 2021-08-16 ENCOUNTER — Other Ambulatory Visit: Payer: Self-pay

## 2021-08-16 MED ORDER — NYSTATIN 100000 UNIT/ML MT SUSP
OROMUCOSAL | 0 refills | Status: DC
  Filled 2021-08-16: qty 100, 5d supply, fill #0

## 2021-08-16 MED ORDER — POTASSIUM CHLORIDE ER 10 MEQ PO TBCR
20.0000 meq | EXTENDED_RELEASE_TABLET | Freq: Every day | ORAL | 2 refills | Status: DC
  Filled 2021-08-16: qty 60, 30d supply, fill #0
  Filled 2021-09-17: qty 60, 30d supply, fill #1
  Filled 2021-10-23 – 2021-11-07 (×2): qty 60, 30d supply, fill #2

## 2021-08-16 NOTE — Telephone Encounter (Signed)
Requested medication (s) are due for refill today: Yes  Requested medication (s) are on the active medication list: Yes  Last refill:  06/14/21  Future visit scheduled: No  Notes to clinic:  See request.    Requested Prescriptions  Pending Prescriptions Disp Refills   nystatin (MYCOSTATIN) 100000 UNIT/ML suspension 100 mL 0    Sig: TAKE 5 MLS (500,000 UNITS TOTAL) BY MOUTH 4 (FOUR) TIMES DAILY. SWISH AND SWALLOW     Off-Protocol Failed - 08/15/2021  5:54 PM      Failed - Medication not assigned to a protocol, review manually.      Passed - Valid encounter within last 12 months    Recent Outpatient Visits           2 months ago Essential hypertension   [place], [place]   6 months ago Essential hypertension   [place], MD   9 months ago Encounter for [employer] annual wellness exam   [name], MD   1 year ago Essential hypertension   [place] [name], MD   1 year ago Abscess of abdominal wall   [place], MD       Future Appointments             In 4 days Cleaver, Jossie Ng, NP [facility], [facility]            Signed Prescriptions Disp Refills   potassium chloride (KLOR-CON) 10 MEQ tablet 60 tablet 2    Sig: Take 2 tablets (20 mEq total) by mouth daily.     Endocrinology:  Minerals - Potassium Supplementation Failed - 08/15/2021  5:54 PM      Failed - K in normal range and within 360 days    Potassium  Date Value Ref Range Status  08/07/2021 3.4 (L) 3.5 - 5.1 mmol/L Final         Passed - Cr in normal range and within 360 days    Creat  Date Value Ref Range Status  04/17/2016 0.77 0.50 - 1.05 mg/dL Final    Comment:      For patients > or = 65 years of age: The upper reference limit for Creatinine is  approximately 13% higher for people identified as African-American.      Creatinine, Ser  Date Value Ref Range Status  08/07/2021 0.76 0.44 - 1.00 mg/dL Final         Passed - Valid encounter within last 12 months    Recent Outpatient Visits           2 months ago Essential hypertension   [place], MD   6 months ago Essential hypertension   [place], MD   9 months ago Encounter for [employer] annual wellness exam   [place] [name], MD   1 year ago Essential hypertension   [place], MD   1 year ago Abscess of abdominal wall   [place], Deborah B, MD       Future Appointments             In 4 days Marilynn Rail, Jossie Ng, NP [facility], [facility]

## 2021-08-19 ENCOUNTER — Other Ambulatory Visit: Payer: Self-pay

## 2021-08-19 NOTE — Progress Notes (Unsigned)
Cardiology Clinic Note   Patient Name: [facility] Date of Encounter: 08/20/2021  Primary Care Provider:  [name], MD Primary Cardiologist:  Glenetta Hew, MD  Patient Profile    Brandy [facility] presents to the clinic today for follow-up evaluation of her atherosclerosis, resistant hypertension, and preoperative cardiac evaluation.  Past Medical History    Past Medical History:  Diagnosis Date   Arthritis    back problems to be evaluated by neurology next month   Cancer [facility]) 2009   L, Breast, radiation and surgery.   COPD (chronic obstructive pulmonary disease) (HCC)    Depression    GERD (gastroesophageal reflux disease)    Hyperlipemia    Hypertension    Leriche syndrome (HCC)    Infrarenal aortic occlusion extending to bilateral internal iliac arteries.  External iliacs fill via collaterals from lumbar arteries to hypogastric/internal iliac arteries -minimal disease on the right side and no disease on the left side leg arteries distally.   Peripheral vascular disease ([facility]) 2013   Past Surgical History:  Procedure Laterality Date   APPENDECTOMY  1980   BREAST LUMPECTOMY Left    s/p radiation therapy    BREAST SURGERY     COLONOSCOPY  07/2015   COLONOSCOPY WITH PROPOFOL N/A 06/26/2015   Procedure: COLONOSCOPY WITH PROPOFOL;  Surgeon: [name], MD;  Location: [facility];  Service: Endoscopy;  Laterality: N/A;   CT CTA CORONARY W/CA SCORE W/CM &/OR WO/CM  12/10/2017   Coronary calcium score is 0.  No evidence of CAD.   PERIPHERAL VASCULAR CATHETERIZATION N/A 10/22/2015   Procedure: Abdominal Aortogram w/Lower Extremity;  Surgeon: [name], MD;  Location: [facility] INVASIVE CV LAB: Aortic arch widely patent as far as innominate artery, right subclavian artery, right vertebral artery and right, common carotid.  Bilateral single renal arteries - Nonocclusive disease. Infrarenal aorta 100% - bilateral common Iliac 100% --> B Ex  Iliacs fill via Int Iliac. Leg OK   TRANSTHORACIC ECHOCARDIOGRAM  08/2017   EF 60-65%. No RWMA.  Normal DF.  Normal valves.    TUBAL LIGATION  1991    Allergies  Allergies  Allergen Reactions   Vicodin [Hydrocodone-Acetaminophen] Nausea And Vomiting and Other (See Comments)    Sweating   Bactrim [Sulfamethoxazole-Trimethoprim]     HA and vomiting   Incruse Ellipta [Umeclidinium Bromide] Other (See Comments)    Dry mouth   Tramadol Nausea And Vomiting   Wellbutrin [Bupropion] Other (See Comments)    Made me feel bad and sleepy    History of Present Illness    Brandy Stevenson has a PMH of atherosclerosis, PVD with intermittent claudication, hyperlipidemia, and resistant hypertension.  Her PMH also includes COPD and previous breast CA.  She was seen in follow-up by Dr. Ellyn Hack on 01/20/2020.  During that time her blood pressure was elevated.  She was started on hydralazine twice daily along with her amlodipine, and lisinopril.  Her PCP was following her hyperlipidemia.  She reported that she has stopped her Pletal and it was recommended that it be restarted.  She was instructed to follow-up with vascular surgery.  She reported that she felt fairly well.  She was not very active due to not feeling safe in her neighborhood because of all the dogs.  She was doing leg exercises at home.  She was elevating her feet when she noted edema.  She reported that her blood pressure was fairly well controlled at home.  She was trying  to lose weight and had been adjusting her diet.  Her COPD was at baseline with no resting dyspnea.  She reported bilateral chest ache with deep inspiration.  She did not have exertional chest discomfort.  She presents to the clinic today for follow-up evaluation and preoperative cardiac evaluation.  She states she noticed increased pain around 3 to 4 weeks ago.  She has been visiting her aunt and has been out unable to walk with her due to pain in bilateral lower  extremities.  We reviewed recommendations from vein and vascular surgery.  She is unable to perform traditional stress testing.  I will order a coronary CTA, give her a salty 6 diet sheet, have her increase her physical activity as tolerated, give high-fiber diet information, and recommended diclofenac gel.  We will have her follow-up in 12 months.  Today she denies chest pain, shortness of breath, lower extremity edema, fatigue, palpitations, melena, hematuria, hemoptysis, diaphoresis, presyncope, syncope, orthopnea, and PND.   Home Medications    Prior to Admission medications   Medication Sig Start Date End Date Taking? Authorizing Provider  acetaminophen (TYLENOL) 500 MG tablet Take 1,000 mg by mouth every 8 (eight) hours.    [provider]  albuterol (PROAIR HFA) 108 (90 Base) MCG/ACT inhaler Inhale 2 puffs into the lungs every 6 (six) hours as needed for wheezing or shortness of breath. Patient taking differently: Inhale 2 puffs into the lungs. 2 puffs 2-4 times a day as needed for wheezing 06/14/21   [name], MD  amLODipine (NORVASC) 10 MG tablet TAKE 1 TABLET BY MOUTH AT  BEDTIME Patient taking differently: Take 10 mg by mouth daily. 06/24/21   [name], MD  aspirin EC 81 MG tablet Take 1 tablet (81 mg total) by mouth daily. 10/03/16   Funches, Adriana Mccallum, MD  atorvastatin (LIPITOR) 40 MG tablet TAKE 1 TABLET BY MOUTH  DAILY Patient taking differently: Take 40 mg by mouth at bedtime. 06/24/21   [name], MD  carvedilol (COREG) 6.25 MG tablet TAKE 1 TABLET (6.25 MG TOTAL) BY MOUTH 2 (TWO) TIMES DAILY. Patient taking differently: Take 6.25 mg by mouth 2 (two) times daily. 07/12/21 07/12/22  [name], MD  cilostazol (PLETAL) 50 MG tablet TAKE 1 TABLET BY MOUTH TWICE  DAILY Patient taking differently: Take 50 mg by mouth 2 (two) times daily. 06/26/21   [name], MD  fluticasone-salmeterol (ADVAIR) 250-50 MCG/ACT AEPB Inhale 1 puff into the  lungs in the morning and at bedtime. 06/14/21   [name], MD  gabapentin (NEURONTIN) 300 MG capsule TAKE 1 CAPSULE BY MOUTH AT  BEDTIME Patient taking differently: Take 300 mg by mouth at bedtime. 06/24/21   [name], MD  hydrALAZINE (APRESOLINE) 25 MG tablet Take 1 tablet (25 mg total) by mouth 3 (three) times daily. Patient taking differently: Take 25 mg by mouth in the morning and at bedtime. 06/14/21   [name], MD  lisinopril (ZESTRIL) 40 MG tablet TAKE 1 TABLET BY MOUTH  DAILY Patient taking differently: Take 40 mg by mouth daily. 06/24/21   [name], MD  meloxicam (MOBIC) 15 MG tablet TAKE 1 TABLET BY MOUTH  DAILY Patient taking differently: Take 15 mg by mouth daily. 06/24/21   [name], MD  Menthol, Topical Analgesic, (BIOFREEZE EX) Apply 1 patch topically once.    [provider]  Menthol, Topical Analgesic, (BIOFREEZE ROLL-ON EX) Apply 1 application. topically at bedtime as needed (  back pain).    [provider]  nystatin (MYCOSTATIN) 100000 UNIT/ML suspension TAKE 5 MLS (500,000 UNITS TOTAL) BY MOUTH 4 (FOUR) TIMES DAILY. SWISH AND SWALLOW 08/16/21   [name], MD  omeprazole (PRILOSEC) 40 MG capsule TAKE 1 CAPSULE BY MOUTH  DAILY Patient taking differently: Take 40 mg by mouth daily. 06/24/21   [name], MD  ondansetron (ZOFRAN-ODT) 4 MG disintegrating tablet Take 1 tablet (4 mg total) by mouth every 8 (eight) hours as needed for nausea or vomiting. 08/07/21   Godfrey Pick, MD  oxyCODONE-acetaminophen (PERCOCET/ROXICET) 5-325 MG tablet Take 1 tablet by mouth every 8 (eight) hours as needed for up to 15 doses for severe pain. 08/07/21   Godfrey Pick, MD  potassium chloride (KLOR-CON) 10 MEQ tablet Take 2 tablets (20 mEq total) by mouth once daily. 08/16/21   [name], MD  spironolactone (ALDACTONE) 25 MG tablet Take 1 tablet (25 mg total) by mouth daily. 06/14/21   [name], MD    Family  History    Family History  Problem Relation Age of Onset   Hyperlipidemia Mother    Hypertension Mother    Heart disease Mother        Atrial Fib.   Hyperlipidemia Sister    Hypertension Sister    Heart disease Sister        Before age 63,  CHF   Heart attack Brother    Hyperlipidemia Brother    Hypertension Brother    Heart disease Brother    Hypertension Daughter    She indicated that her mother is alive. She indicated that her father is deceased. She indicated that her sister is alive. She indicated that both of her brothers are deceased. She indicated that her daughter is alive.  Social History    Social History   Socioeconomic History   Marital status: Married    Spouse name: Not on file   Number of children: Not on file   Years of education: Not on file   Highest education level: Not on file  Occupational History   Not on file  Tobacco Use   Smoking status: Former    Packs/day: 0.25    Years: 40.00    Total pack years: 10.00    Types: Cigarettes    Quit date: 02/08/2017    Years since quitting: 4.5    Passive exposure: Never   Smokeless tobacco: Never  Vaping Use   Vaping Use: Never used  Substance and Sexual Activity   Alcohol use: No   Drug use: No   Sexual activity: Yes  Other Topics Concern   Not on file  Social History Narrative   Not on file   Social Determinants of Health   Financial Resource Strain: Not on file  Food Insecurity: Not on file  Transportation Needs: Not on file  Physical Activity: Not on file  Stress: Not on file  Social Connections: Not on file  Intimate Partner Violence: Not on file     Review of Systems    General:  No chills, fever, night sweats or weight changes.  Cardiovascular:  No chest pain, dyspnea on exertion, edema, orthopnea, palpitations, paroxysmal nocturnal dyspnea. Dermatological: No rash, lesions/masses Respiratory: No cough, dyspnea Urologic: No hematuria, dysuria Abdominal:   No nausea, vomiting,  diarrhea, bright red blood per rectum, melena, or hematemesis Neurologic:  No visual changes, wkns, changes in mental status. All other systems reviewed and are otherwise negative except as noted above.  Physical Exam    VS:  BP 140/82   Pulse 62   Ht '5\' 5"'$  (1.651 m)   Wt 186 lb 6.4 oz (84.6 kg)   SpO2 97%   BMI 31.02 kg/m  , BMI Body mass index is 31.02 kg/m. GEN: Well nourished, well developed, in no acute distress. HEENT: normal. Neck: Supple, no JVD, carotid bruits, or masses. Cardiac: RRR, no murmurs, rubs, or gallops. No clubbing, cyanosis, edema.  Radials 2+/DP/PT 1+ and equal bilaterally.  Respiratory:  Respirations regular and unlabored, clear to auscultation bilaterally. GI: Soft, nontender, nondistended, BS + x 4. MS: no deformity or atrophy. Skin: warm and dry, no rash. Neuro:  Strength and sensation are intact. Psych: Normal affect.  Accessory Clinical Findings    Recent Labs: 06/14/2021: ALT 19 08/07/2021: BUN 7; Creatinine, Ser 0.76; Hemoglobin 12.1; Platelets 251; Potassium 3.4; Sodium 140   Recent Lipid Panel    Component Value Date/Time   CHOL 150 06/14/2021 1458   TRIG 98 06/14/2021 1458   HDL 67 06/14/2021 1458   CHOLHDL 2.2 06/14/2021 1458   CHOLHDL 2.8 07/27/2014 1207   VLDL 32 07/27/2014 1207   LDLCALC 65 06/14/2021 1458    ECG personally reviewed by me today-normal sinus rhythm 62 bpm no ST or T wave deviation.- No acute changes  Echocardiogram 09/07/2017  Study Conclusions   - Left ventricle: The cavity size was normal. Systolic function was    normal. The estimated ejection fraction was in the range of 60%    to 65%. Wall motion was normal; there were no regional wall    motion abnormalities. Left ventricular diastolic function    parameters were normal.   Assessment & Plan   1.  Resistant hypertension-BP today 136/78.  Well-controlled at home. Continue amlodipine, lisinopril, hydralazine, spironolactone, carvedilol Heart healthy  low-sodium diet-salty 6 given Increase physical activity as tolerated Order BMP  Hyperlipidemia-LDL 65 on 06/14/2021. Continue atorvastatin Heart healthy low-sodium high-fiber diet Increase physical activity as tolerated Follows with PCP  PVD-continues to note occasional periods of intermittent claudication.  Has restarted Pletal.  Was seen in the emergency department on 08/07/2021 and reporting bilateral leg pain.  Negative DVT, CTA with runoff showed chronic infra renal aortic occlusion extending into bilateral CIA.  Exercise challenge showed cramping in both calves after 1 lap.  Cramps resolved with rest.  ABIs unchanged from 3/22.  No nonhealing sores.  Motor and sensory intact.  Scheduled for BUE arterial duplex.  Possibly may have femoral bypass versus aortofemoral bypass versus conservative management. Continue Pletal Increase physical activity as tolerated  Preoperative cardiac evaluation-vascular surgery as discussed above. Patient is not able to complete greater than 4 METS of physical activity. Order Coronary CTA. Order CBC, BMP   Follow-up with Dr. Ellyn Hack or me in 12 months.   Jossie Ng. Maddox Hlavaty NP-C    08/20/2021, 8:47 AM [address] Office [phone] Fax [phone]  Notice: This dictation was prepared with Dragon dictation along with smaller phrase technology. Any transcriptional errors that result from this process are unintentional and may not be corrected upon review.  I spent 14 minutes examining this patient, reviewing medications, and using patient centered shared decision making involving her cardiac care.  Prior to her visit I spent greater than 20 minutes reviewing her past medical history,  medications, and prior cardiac tests.

## 2021-08-20 ENCOUNTER — Ambulatory Visit (INDEPENDENT_AMBULATORY_CARE_PROVIDER_SITE_OTHER): Payer: Medicare Other | Admitting: General Practice

## 2021-08-20 ENCOUNTER — Encounter: Payer: Self-pay | Admitting: General Practice

## 2021-08-20 ENCOUNTER — Other Ambulatory Visit: Payer: Self-pay | Admitting: *Deleted

## 2021-08-20 VITALS — BP 136/78 | HR 62 | Ht 65.0 in | Wt 186.4 lb

## 2021-08-20 DIAGNOSIS — I70219 Atherosclerosis of native arteries of extremities with intermittent claudication, unspecified extremity: Secondary | ICD-10-CM

## 2021-08-20 DIAGNOSIS — Z01810 Encounter for preprocedural cardiovascular examination: Secondary | ICD-10-CM

## 2021-08-20 DIAGNOSIS — E785 Hyperlipidemia, unspecified: Secondary | ICD-10-CM | POA: Diagnosis not present

## 2021-08-20 DIAGNOSIS — I700 Atherosclerosis of aorta: Secondary | ICD-10-CM

## 2021-08-20 DIAGNOSIS — I10 Essential (primary) hypertension: Secondary | ICD-10-CM | POA: Diagnosis not present

## 2021-08-20 NOTE — Patient Instructions (Signed)
Medication Instructions:  The current medical regimen is effective;  continue present plan and medications as directed. Please refer to the Current Medication list given to you today.   *If you need a refill on your cardiac medications before your next appointment, please call your pharmacy*  Lab Work:   Testing/Procedures:  CBC, BMET TODAY  CTA-[facility]  If you have labs (blood work) drawn today and your tests are completely normal, you will receive your results only by: [place] (if you have MyChart) OR  A paper copy in the mail If you have any lab test that is abnormal or we need to change your treatment, we will call you to review the results.  Special Instructions PLEASE READ AND FOLLOW SALTY 6-ATTACHED-1,'800mg'$  daily  REFERRAL TO VASCULAR SURGERY  OK TO USE DICLOFENAC GEL  Follow-Up: Your next appointment:  12 month(s) In Person with Glenetta Hew, MD   Please call our office 2 months in advance to schedule this appointment  :1  At [facility], you and your health needs are our priority.  As part of our continuing mission to provide you with exceptional heart care, we have created designated Provider Care Teams.  These Care Teams include your primary Cardiologist (physician) and Advanced Practice Providers (APPs -  Physician Assistants and Nurse Practitioners) who all work together to provide you with the care you need, when you need it.  Important Information About Sugar               6 SALTY THINGS TO AVOID     1,'800MG'$  DAILY

## 2021-08-21 ENCOUNTER — Ambulatory Visit: Payer: Medicare Other

## 2021-08-21 LAB — CBC
Hematocrit: 37 % (ref 34.0–46.6)
Hemoglobin: 12.6 g/dL (ref 11.1–15.9)
MCH: 32.4 pg (ref 26.6–33.0)
MCHC: 34.1 g/dL (ref 31.5–35.7)
MCV: 95 fL (ref 79–97)
Platelets: 237 10*3/uL (ref 150–450)
RBC: 3.89 x10E6/uL (ref 3.77–5.28)
RDW: 12.9 % (ref 11.7–15.4)
WBC: 4 10*3/uL (ref 3.4–10.8)

## 2021-08-21 LAB — BASIC METABOLIC PANEL
BUN/Creatinine Ratio: 14 (ref 12–28)
BUN: 12 mg/dL (ref 8–27)
CO2: 21 mmol/L (ref 20–29)
Calcium: 9.5 mg/dL (ref 8.7–10.3)
Chloride: 104 mmol/L (ref 96–106)
Creatinine, Ser: 0.85 mg/dL (ref 0.57–1.00)
Glucose: 90 mg/dL (ref 70–99)
Potassium: 4.3 mmol/L (ref 3.5–5.2)
Sodium: 141 mmol/L (ref 134–144)
eGFR: 76 mL/min/{1.73_m2} (ref >59)

## 2021-08-22 ENCOUNTER — Other Ambulatory Visit: Payer: Self-pay

## 2021-08-23 ENCOUNTER — Telehealth: Payer: Self-pay

## 2021-08-23 NOTE — Telephone Encounter (Signed)
Pt called c/o leg pain keeping her up at night.  Reviewed pt's chart, returned pt's call, two identifiers used. Pain no worse than when she was last seen. Informed her that at this time, there were no earlier appts available and we were still waiting on tests for cardiac clearance for possible future surgery. Instructed pt to continue elevating legs, wearing compression hose, and take OTC meds for pain. Directed her to seek emergent care at the ED if her pain became severe. Pt confirmed understanding.

## 2021-08-28 ENCOUNTER — Other Ambulatory Visit: Payer: Self-pay | Admitting: Internal Medicine

## 2021-08-28 DIAGNOSIS — Z760 Encounter for issue of repeat prescription: Secondary | ICD-10-CM

## 2021-08-28 DIAGNOSIS — I70219 Atherosclerosis of native arteries of extremities with intermittent claudication, unspecified extremity: Secondary | ICD-10-CM

## 2021-09-02 ENCOUNTER — Ambulatory Visit (HOSPITAL_COMMUNITY)
Admission: RE | Admit: 2021-09-02 | Discharge: 2021-09-02 | Disposition: A | Discharge status: Home / Self Care | Payer: Medicare Other | Source: Ambulatory Visit | Attending: Vascular Surgery | Admitting: Vascular Surgery

## 2021-09-02 DIAGNOSIS — I700 Atherosclerosis of aorta: Secondary | ICD-10-CM | POA: Insufficient documentation

## 2021-09-05 ENCOUNTER — Ambulatory Visit (INDEPENDENT_AMBULATORY_CARE_PROVIDER_SITE_OTHER): Payer: Medicare Other | Admitting: Vascular Surgery

## 2021-09-05 ENCOUNTER — Encounter: Payer: Self-pay | Admitting: Vascular Surgery

## 2021-09-05 VITALS — BP 123/80 | HR 59 | Temp 97.9°F | Resp 20 | Ht 65.0 in | Wt 184.0 lb

## 2021-09-05 DIAGNOSIS — I7409 Other arterial embolism and thrombosis of abdominal aorta: Secondary | ICD-10-CM | POA: Diagnosis not present

## 2021-09-05 NOTE — Progress Notes (Signed)
REASON FOR VISIT:   Follow-up of aortoiliac occlusive disease  MEDICAL ISSUES:   AORTOILIAC OCCLUSIVE DISEASE: This patient has progressive symptoms in her left leg and now has rest pain in the left lower extremity.  She has a known aortic occlusion with reconstitution of her external iliac arteries.  She does have some infrainguinal arterial occlusive disease bilaterally.  Her aorta is heavily calcified all the way up to the renals.  She also has COPD.  All things considered I think the safest approach for revascularization would be a left axillobifemoral bypass graft.  She is undergoing cardiac clearance.  She is scheduled for a coronary CT on 09/11/2021.  If she is cleared from a cardiac standpoint we will try to arrange for her surgery in the near future.  I have discussed the indications for the procedure and the potential complications with the patient.  The risks include, but are not limited to, bleeding, wound healing problems, and graft thrombosis.  I explained that for an axillobifemoral bypass graft these typically have a 5-year patency of about 70%.  We will need to follow the graft closely postoperatively.  HPI:   Brandy Stevenson is a pleasant 66 y.o. female who I last saw on 01/16/2021 with bilateral lower extremity swelling.  Duplex scan showed no evidence of DVT or significant venous insufficiency.  We discussed the importance of elevation.  I previously been following her with peripheral arterial disease.  When I last saw her she had stable claudication of both lower extremities with no rest pain or nonhealing ulcers.  She was motivated to exercise but it was difficult for her as she did not feel safe in her neighborhood.   She was seen more recently by Leontine Locket, PA on 08/15/2021 with left leg pain.  Her ABIs at that time were essentially unchanged from March 2022.  She did describe some rest pain in her left foot.  She has not aortic occlusion which is chronic.  The  patient was set up for an office visit to get bilateral upper extremity arterial duplex so that we could consider axillobifemoral bypass grafting versus aortofemoral bypass grafting versus conservative treatment.  She was also to get preoperative cardiac clearance.  She is scheduled for further testing on July 12.  The patient continues to have significant rest pain in the left foot.  She also describes bilateral lower extremity claudication in a fairly short distance.  She denies any recent chest pain or chest pressure.  She does have a history of COPD and is on inhalers.  She quit smoking in 2018.  She had a previous appendectomy.  Past Medical History:  Diagnosis Date   Arthritis    back problems to be evaluated by neurology next month   Cancer [facility]) 2009   L, Breast, radiation and surgery.   COPD (chronic obstructive pulmonary disease) (HCC)    Depression    GERD (gastroesophageal reflux disease)    Hyperlipemia    Hypertension    Leriche syndrome (HCC)    Infrarenal aortic occlusion extending to bilateral internal iliac arteries.  External iliacs fill via collaterals from lumbar arteries to hypogastric/internal iliac arteries -minimal disease on the right side and no disease on the left side leg arteries distally.   Peripheral vascular disease ([place]) 2013    Family History  Problem Relation Age of Onset   Hyperlipidemia Mother    Hypertension Mother    Heart disease Mother  Atrial Fib.   Hyperlipidemia Sister    Hypertension Sister    Heart disease Sister        Before age 23,  CHF   Heart attack Brother    Hyperlipidemia Brother    Hypertension Brother    Heart disease Brother    Hypertension Daughter     SOCIAL HISTORY: Social History   Tobacco Use   Smoking status: Former    Packs/day: 0.25    Years: 40.00    Total pack years: 10.00    Types: Cigarettes    Quit date: 02/08/2017    Years since quitting: 4.5    Passive exposure: Never   Smokeless  tobacco: Never  Substance Use Topics   Alcohol use: No    Allergies  Allergen Reactions   Vicodin [Hydrocodone-Acetaminophen] Nausea And Vomiting and Other (See Comments)    Sweating   Bactrim [Sulfamethoxazole-Trimethoprim]     HA and vomiting   Incruse Ellipta [Umeclidinium Bromide] Other (See Comments)    Dry mouth   Tramadol Nausea And Vomiting   Wellbutrin [Bupropion] Other (See Comments)    Made me feel bad and sleepy    Current Outpatient Medications  Medication Sig Dispense Refill   acetaminophen (TYLENOL) 500 MG tablet Take 1,000 mg by mouth every 8 (eight) hours.     albuterol (PROAIR HFA) 108 (90 Base) MCG/ACT inhaler Inhale 2 puffs into the lungs every 6 (six) hours as needed for wheezing or shortness of breath. (Patient taking differently: Inhale 2 puffs into the lungs. 2 puffs 2-4 times a day as needed for wheezing) 8.5 g 5   amLODipine (NORVASC) 10 MG tablet TAKE 1 TABLET BY MOUTH AT  BEDTIME (Patient taking differently: Take 10 mg by mouth daily.) 100 tablet 2   aspirin EC 81 MG tablet Take 1 tablet (81 mg total) by mouth daily. 90 tablet 3   atorvastatin (LIPITOR) 40 MG tablet TAKE 1 TABLET BY MOUTH  DAILY (Patient taking differently: Take 40 mg by mouth at bedtime.) 100 tablet 2   carvedilol (COREG) 6.25 MG tablet TAKE 1 TABLET (6.25 MG TOTAL) BY MOUTH 2 (TWO) TIMES DAILY. (Patient taking differently: Take 6.25 mg by mouth 2 (two) times daily.) 180 tablet 0   cilostazol (PLETAL) 50 MG tablet TAKE 1 TABLET BY MOUTH TWICE  DAILY (Patient taking differently: Take 50 mg by mouth 2 (two) times daily.) 180 tablet 3   fluticasone-salmeterol (ADVAIR) 250-50 MCG/ACT AEPB Inhale 1 puff into the lungs in the morning and at bedtime. 60 each 6   gabapentin (NEURONTIN) 300 MG capsule TAKE 1 CAPSULE BY MOUTH AT  BEDTIME (Patient taking differently: Take 300 mg by mouth at bedtime.) 90 capsule 0   hydrALAZINE (APRESOLINE) 25 MG tablet Take 1 tablet (25 mg total) by mouth 3 (three)  times daily. (Patient taking differently: Take 25 mg by mouth in the morning and at bedtime.) 180 tablet 6   lisinopril (ZESTRIL) 40 MG tablet TAKE 1 TABLET BY MOUTH  DAILY (Patient taking differently: Take 40 mg by mouth daily.) 100 tablet 2   meloxicam (MOBIC) 15 MG tablet TAKE 1 TABLET BY MOUTH  DAILY (Patient taking differently: Take 15 mg by mouth daily.) 90 tablet 0   Menthol, Topical Analgesic, (BIOFREEZE EX) Apply 1 patch topically once.     Menthol, Topical Analgesic, (BIOFREEZE ROLL-ON EX) Apply 1 application. topically at bedtime as needed (back pain).     nystatin (MYCOSTATIN) 100000 UNIT/ML suspension TAKE 5 MLS (500,000 UNITS TOTAL) BY  MOUTH 4 (FOUR) TIMES DAILY. SWISH AND SWALLOW 100 mL 0   omeprazole (PRILOSEC) 40 MG capsule TAKE 1 CAPSULE BY MOUTH  DAILY (Patient taking differently: Take 40 mg by mouth daily.) 90 capsule 0   potassium chloride (KLOR-CON) 10 MEQ tablet Take 2 tablets (20 mEq total) by mouth once daily. 60 tablet 2   spironolactone (ALDACTONE) 25 MG tablet Take 1 tablet (25 mg total) by mouth daily. 90 tablet 3   No current facility-administered medications for this visit.    REVIEW OF SYSTEMS:  '[X]'$  denotes positive finding, '[ ]'$  denotes negative finding Cardiac  Comments:  Chest pain or chest pressure:    Shortness of breath upon exertion:    Short of breath when lying flat:    Irregular heart rhythm:        Vascular    Pain in calf, thigh, or hip brought on by ambulation: x   Pain in feet at night that wakes you up from your sleep:  x   Blood clot in your veins:    Leg swelling:         Pulmonary    Oxygen at home:    Productive cough:     Wheezing:  x       Neurologic    Sudden weakness in arms or legs:     Sudden numbness in arms or legs:     Sudden onset of difficulty speaking or slurred speech:    Temporary loss of vision in one eye:     Problems with dizziness:         Gastrointestinal    Blood in stool:     Vomited blood:          Genitourinary    Burning when urinating:     Blood in urine:        Psychiatric    Major depression:         Hematologic    Bleeding problems:    Problems with blood clotting too easily:        Skin    Rashes or ulcers:        Constitutional    Fever or chills:     PHYSICAL EXAM:   Vitals:   09/05/21 1017  BP: 126/77  Pulse: (!) 59  Resp: 20  Temp: 97.9 F (36.6 C)  SpO2: 96%  Weight: 184 lb (83.5 kg)  Height: '5\' 5"'$  (1.651 m)    GENERAL: The patient is a well-nourished female, in no acute distress. The vital signs are documented above. CARDIAC: There is a regular rate and rhythm.  VASCULAR: I do not detect carotid bruits.  I do not detect's supraclavicular bruits. I cannot palpate femoral pulses. She has a monophasic dorsalis pedis and posterior tibial signal bilaterally  PULMONARY: There is good air exchange bilaterally without wheezing or rales. ABDOMEN: Soft and non-tender with normal pitched bowel sounds.  MUSCULOSKELETAL: There are no major deformities or cyanosis. NEUROLOGIC: No focal weakness or paresthesias are detected. SKIN: There are no ulcers or rashes noted. PSYCHIATRIC: The patient has a normal affect.  DATA:    UPPER EXTREMITY ARTERIAL DUPLEX: I have reviewed her upper extremity arterial duplex scan that was done yesterday.  On the right side she had multiphasic signals throughout the entire right upper extremity.  On the left side she had multiphasic signals throughout the entire upper extremity.  There was no evidence of significant upper extremity arterial occlusive disease bilaterally.  CT ANGIOGRAM: I reviewed the  CT angiogram that was done on 08/07/2021.  She has an aortic occlusion.  The aorta is heavily calcified up to the renals.  There is reconstitution of her external iliac arteries via the hypogastrics.  She has some moderate infrainguinal arterial occlusive disease bilaterally.  On the left side she does have three-vessel runoff.  On  the right side she has runoff via the anterior tibial artery.  Deitra Mayo [facility] [phone]

## 2021-09-09 ENCOUNTER — Ambulatory Visit: Payer: Medicare Other

## 2021-09-10 ENCOUNTER — Telehealth (HOSPITAL_COMMUNITY): Payer: Self-pay | Admitting: *Deleted

## 2021-09-10 NOTE — Telephone Encounter (Signed)
Attempted to call patient regarding upcoming cardiac CT appointment. °Left message on voicemail with name and callback number ° °Elliana Bal RN Navigator Cardiac Imaging °[facility] °[phone] Office °[phone] Cell ° °

## 2021-09-11 ENCOUNTER — Ambulatory Visit (HOSPITAL_COMMUNITY)
Admission: RE | Admit: 2021-09-11 | Discharge: 2021-09-11 | Disposition: A | Discharge status: Home / Self Care | Payer: Medicare Other | Source: Ambulatory Visit | Attending: General Practice | Admitting: General Practice

## 2021-09-11 DIAGNOSIS — I10 Essential (primary) hypertension: Secondary | ICD-10-CM | POA: Diagnosis not present

## 2021-09-11 DIAGNOSIS — I700 Atherosclerosis of aorta: Secondary | ICD-10-CM | POA: Insufficient documentation

## 2021-09-11 DIAGNOSIS — I70219 Atherosclerosis of native arteries of extremities with intermittent claudication, unspecified extremity: Secondary | ICD-10-CM | POA: Insufficient documentation

## 2021-09-11 MED ORDER — NITROGLYCERIN 0.4 MG SL SUBL
SUBLINGUAL_TABLET | SUBLINGUAL | Status: AC
  Filled 2021-09-11: qty 2

## 2021-09-11 MED ORDER — IOHEXOL 350 MG/ML SOLN
100.0000 mL | Freq: Once | INTRAVENOUS | Status: AC | PRN
  Administered 2021-09-11: 100 mL via INTRAVENOUS

## 2021-09-11 MED ORDER — NITROGLYCERIN 0.4 MG SL SUBL
0.8000 mg | SUBLINGUAL_TABLET | Freq: Once | SUBLINGUAL | Status: AC
  Administered 2021-09-11: 0.8 mg via SUBLINGUAL

## 2021-09-12 ENCOUNTER — Telehealth: Payer: Self-pay | Admitting: General Practice

## 2021-09-12 NOTE — Telephone Encounter (Signed)
   Name: Brandy Stevenson [facility]  DOB: 1955-10-12  MRN: [id number]  Primary Cardiologist: Glenetta Hew, MD  Chart reviewed as part of pre-operative protocol coverage. Because of [name]'s past medical history and time since last visit, she will require a follow-up in-office visit in order to better assess preoperative cardiovascular risk.  Pre-op covering staff: - Please schedule appointment and call patient to inform them. If patient already had an upcoming appointment within acceptable timeframe, please add "pre-op clearance" to the appointment notes so provider is aware. - Please contact requesting surgeon's office via preferred method (i.e, phone, fax) to inform them of need for appointment prior to surgery.  Pharmacologic clearance was requested to hold her aspirin prior to procedure.  It seems like the main indication for the aspirin is her PVD with intermittent claudication.  Recent coronary CTA with calcium score of 0.   Elgie Collard, PA-C  09/12/2021, 1:30 PM

## 2021-09-12 NOTE — Telephone Encounter (Signed)
   Pre-operative Risk Assessment    Patient Name: Brandy Stevenson [facility]  DOB: 06-May-1955 MRN: [id number]      Request for Surgical Clearance    Procedure:  Left AX Bifem  Date of Surgery:  Clearance 09/26/21                                 Surgeon:  Dr. Daiva Eves Group or Practice Name:  [facility] Phone number:  [phone] Fax number:  [phone]   Type of Clearance Requested:   - Medical  - Pharmacy:  Hold Aspirin TBD by Cardiologist   Type of Anesthesia:  Not Indicated   Additional requests/questions:  Please fax a copy of medical clearance to the surgeon's office.  Romilda Garret   09/12/2021, 12:19 PM

## 2021-09-13 NOTE — Telephone Encounter (Signed)
1st attempt to reach pt regarding surgical clearance and the need for a tele visit.  Left message for pt to call back and ask for the preop team.  

## 2021-09-17 ENCOUNTER — Other Ambulatory Visit: Payer: Self-pay

## 2021-09-18 ENCOUNTER — Other Ambulatory Visit: Payer: Self-pay

## 2021-09-18 NOTE — Telephone Encounter (Signed)
I left a message for the pt to call back for a tele appt per the pre op provider Nicholes Rough, PAC. Pt was recently seen by Coletta Memos, FNP for pre op clearance. FNP had ordered cardiac testing, which seems to have been completed. I will forward this back to pre op to follow up with Coletta Memos, FNP if he has cleared the pt from his prospective.

## 2021-09-18 NOTE — Telephone Encounter (Signed)
   Patient Name: Brandy Stevenson [facility]  DOB: [birth year] MRN: [id number]  Primary Cardiologist: Glenetta Hew, MD  Chart reviewed as part of pre-operative protocol coverage.   Patient was previously evaluated by Coletta Memos, FNP for preoperative cardiac evaluation on 08/20/2021.  Patient was pending coronary CT angiogram to determine final clearance. It appears testing was completed and has been resulted by provider.  I will route this reminder to the provider to request they finalize clearance for this patient.   Lenna Sciara, NP 09/18/2021, 11:34 AM

## 2021-09-19 NOTE — Telephone Encounter (Signed)
   Primary Cardiologist: Glenetta Hew, MD  Chart reviewed as part of pre-operative protocol coverage. Given past medical history and time since last visit, based on ACC/AHA guidelines, Brandy Stevenson [facility] would be at acceptable risk for the planned procedure without further cardiovascular testing.   Ideally aspirin should be continued without interruption, however if the bleeding risk is too great, aspirin may be held for 7 days prior to surgery. Please resume aspirin post operatively when it is felt to be safe from a bleeding standpoint.   I will route this recommendation to the requesting party via Epic fax function and remove from pre-op pool.  Please call with questions.  Brandy Life, NP-C    09/19/2021, 8:29 AM [place] [address], [address] Office [phone] Fax [phone]

## 2021-09-23 ENCOUNTER — Other Ambulatory Visit: Payer: Self-pay

## 2021-09-23 DIAGNOSIS — I7409 Other arterial embolism and thrombosis of abdominal aorta: Secondary | ICD-10-CM

## 2021-09-24 ENCOUNTER — Other Ambulatory Visit: Payer: Self-pay

## 2021-09-25 ENCOUNTER — Other Ambulatory Visit: Payer: Self-pay

## 2021-09-29 ENCOUNTER — Other Ambulatory Visit: Payer: Self-pay | Admitting: Internal Medicine

## 2021-09-29 DIAGNOSIS — Z760 Encounter for issue of repeat prescription: Secondary | ICD-10-CM

## 2021-10-01 ENCOUNTER — Other Ambulatory Visit: Payer: Self-pay | Admitting: Internal Medicine

## 2021-10-01 DIAGNOSIS — I10 Essential (primary) hypertension: Secondary | ICD-10-CM

## 2021-10-01 NOTE — Telephone Encounter (Signed)
Requested Prescriptions  Pending Prescriptions Disp Refills  . omeprazole (PRILOSEC) 40 MG capsule [Pharmacy Med Name: Omeprazole 40 MG Oral Capsule Delayed Release] 90 capsule 1    Sig: TAKE 1 CAPSULE BY MOUTH DAILY     Gastroenterology: Proton Pump Inhibitors Passed - 09/29/2021  8:05 AM      Passed - Valid encounter within last 12 months    Recent Outpatient Visits          3 months ago Essential hypertension   [place], MD   7 months ago Essential hypertension   [place], MD   11 months ago Encounter for [employer] annual wellness exam   [name], MD   1 year ago Essential hypertension   [place], MD   1 year ago Abscess of abdominal wall   [place], MD      Future Appointments            In 3 weeks Thereasa Solo, [place] Bilis [place]

## 2021-10-02 ENCOUNTER — Other Ambulatory Visit: Payer: Self-pay

## 2021-10-02 MED ORDER — CARVEDILOL 6.25 MG PO TABS
ORAL_TABLET | Freq: Two times a day (BID) | ORAL | 0 refills | Status: DC
  Filled 2021-10-02: qty 60, 30d supply, fill #0

## 2021-10-03 ENCOUNTER — Other Ambulatory Visit: Payer: Self-pay

## 2021-10-09 DIAGNOSIS — C50912 Malignant neoplasm of unspecified site of left female breast: Secondary | ICD-10-CM | POA: Diagnosis not present

## 2021-10-21 NOTE — Pre-Procedure Instructions (Addendum)
Surgical Instructions    Your procedure is scheduled on Tuesday, August 29.  Report to [facility] Main Entrance "A" at 5:30 A.M., then check in with the Admitting office.  Call this number if you have problems the morning of surgery:  [phone]   If you have any questions prior to your surgery date call [phone]: Open Monday-Friday 8am-4pm    Remember:  Do not eat or drink after midnight the night before your surgery   Take these medicines the morning of surgery with A SIP OF WATER:  carvedilol (COREG)  omeprazole (PRILOSEC) acetaminophen (TYLENOL) hydrALAZINE (APRESOLINE)   albuterol (PROAIR HFA) 108 (90 Base) MCG/ACT inhaler if needed  **Follow your surgeon's instructions on when to stop Aspirin and cilostazol (Pletal).  If no instructions were given by your surgeon then you will need to call the office to get those instructions**   As of today, STOP taking any Aleve, Naproxen, Ibuprofen, Motrin, Advil, Goody's, BC's, all herbal medications, fish oil, and all vitamins. This includes meloxicam (MOBIC)           [facility] is not responsible for any belongings or valuables.    Do NOT Smoke (Tobacco/Vaping)  24 hours prior to your procedure  If you use a CPAP at night, you may bring your mask for your overnight stay.   Contacts, glasses, hearing aids, dentures or partials may not be worn into surgery, please bring cases for these belongings   For patients admitted to the hospital, discharge time will be determined by your treatment team.   Patients discharged the day of surgery will not be allowed to drive home, and someone needs to stay with them for 24 hours.   SURGICAL WAITING ROOM VISITATION Patients having surgery or a procedure may have no more than 2 support people in the waiting area - these visitors may rotate.   Children under the age of 14 must have an adult with them who is not the patient. If the patient needs to stay at the hospital during part of their  recovery, the visitor guidelines for inpatient rooms apply. Pre-op nurse will coordinate an appropriate time for 1 support person to accompany patient in pre-op.  This support person may not rotate.   Please refer to the [facility] website for the visitor guidelines for Inpatients (after your surgery is over and you are in a regular room).    Special instructions:    Oral Hygiene is also important to reduce your risk of infection.  Remember - BRUSH YOUR TEETH THE MORNING OF SURGERY WITH YOUR REGULAR TOOTHPASTE   [facility]- Preparing For Surgery  Before surgery, you can play an important role. Because skin is not sterile, your skin needs to be as free of germs as possible. You can reduce the number of germs on your skin by washing with CHG (chlorahexidine gluconate) Soap before surgery.  CHG is an antiseptic cleaner which kills germs and bonds with the skin to continue killing germs even after washing.     Please do not use if you have an allergy to CHG or antibacterial soaps. If your skin becomes reddened/irritated stop using the CHG.  Do not shave (including legs and underarms) for at least 48 hours prior to first CHG shower. It is OK to shave your face.  Please follow these instructions carefully.     Shower the NIGHT BEFORE SURGERY and the MORNING OF SURGERY with CHG Soap.   If you chose to wash your hair, wash your  hair first as usual with your normal shampoo. After you shampoo, rinse your hair and body thoroughly to remove the shampoo.  Then [employer] and genitals (private parts) with your normal soap and rinse thoroughly to remove soap.  After that Use CHG Soap as you would any other liquid soap. You can apply CHG directly to the skin and wash gently with a scrungie or a clean washcloth.   Apply the CHG Soap to your body ONLY FROM THE NECK DOWN.  Do not use on open wounds or open sores. Avoid contact with your eyes, ears, mouth and genitals (private parts). Wash Face and genitals  (private parts)  with your normal soap.   Wash thoroughly, paying special attention to the area where your surgery will be performed.  Thoroughly rinse your body with warm water from the neck down.  DO NOT shower/wash with your normal soap after using and rinsing off the CHG Soap.  Pat yourself dry with a CLEAN TOWEL.  Wear CLEAN PAJAMAS to bed the night before surgery  Place CLEAN SHEETS on your bed the night before your surgery  DO NOT SLEEP WITH PETS.   Day of Surgery:  Take a shower with CHG soap. Wear Clean/Comfortable clothing the morning of surgery Do not wear jewelry or makeup. Do not wear lotions, powders, perfumes/cologne or deodorant. Do not shave 48 hours prior to surgery.  Men may shave face and neck. Do not bring valuables to the hospital. Do not wear nail polish, gel polish, artificial nails, or any other type of covering on natural nails (fingers and toes) If you have artificial nails or gel coating that need to be removed by a nail salon, please have this removed prior to surgery. Artificial nails or gel coating may interfere with anesthesia's ability to adequately monitor your vital signs. Remember to brush your teeth WITH YOUR REGULAR TOOTHPASTE.    If you received a COVID test during your pre-op visit, it is requested that you wear a mask when out in public, stay away from anyone that may not be feeling well, and notify your surgeon if you develop symptoms. If you have been in contact with anyone that has tested positive in the last 10 days, please notify your surgeon.    Please read over the following fact sheets that you were given.

## 2021-10-22 ENCOUNTER — Other Ambulatory Visit: Payer: Self-pay

## 2021-10-22 ENCOUNTER — Encounter (HOSPITAL_COMMUNITY)
Admission: RE | Admit: 2021-10-22 | Discharge: 2021-10-22 | Disposition: A | Discharge status: Home / Self Care | Payer: Medicare Other | Source: Ambulatory Visit | Attending: Vascular Surgery | Admitting: Vascular Surgery

## 2021-10-22 ENCOUNTER — Encounter (HOSPITAL_COMMUNITY): Payer: Self-pay

## 2021-10-22 VITALS — BP 144/78 | HR 62 | Temp 98.5°F | Resp 18 | Ht 65.0 in | Wt 184.9 lb

## 2021-10-22 DIAGNOSIS — I7409 Other arterial embolism and thrombosis of abdominal aorta: Secondary | ICD-10-CM | POA: Diagnosis not present

## 2021-10-22 DIAGNOSIS — Z01818 Encounter for other preprocedural examination: Secondary | ICD-10-CM

## 2021-10-22 DIAGNOSIS — Z01812 Encounter for preprocedural laboratory examination: Secondary | ICD-10-CM | POA: Diagnosis not present

## 2021-10-22 HISTORY — DX: Prediabetes: R73.03

## 2021-10-22 LAB — CBC
HCT: 36.5 % (ref 36.0–46.0)
Hemoglobin: 11.9 g/dL — ABNORMAL LOW (ref 12.0–15.0)
MCH: 32.6 pg (ref 26.0–34.0)
MCHC: 32.6 g/dL (ref 30.0–36.0)
MCV: 100 fL (ref 80.0–100.0)
Platelets: 238 10*3/uL (ref 150–400)
RBC: 3.65 MIL/uL — ABNORMAL LOW (ref 3.87–5.11)
RDW: 14.2 % (ref 11.5–15.5)
WBC: 3.7 10*3/uL — ABNORMAL LOW (ref 4.0–10.5)
nRBC: 0 % (ref 0.0–0.2)

## 2021-10-22 LAB — COMPREHENSIVE METABOLIC PANEL
ALT: 25 U/L (ref 0–44)
AST: 20 U/L (ref 15–41)
Albumin: 4.2 g/dL (ref 3.5–5.0)
Alkaline Phosphatase: 66 U/L (ref 38–126)
Anion gap: 9 (ref 5–15)
BUN: 10 mg/dL (ref 8–23)
CO2: 23 mmol/L (ref 22–32)
Calcium: 9 mg/dL (ref 8.9–10.3)
Chloride: 107 mmol/L (ref 98–111)
Creatinine, Ser: 0.82 mg/dL (ref 0.44–1.00)
GFR, Estimated: >60 mL/min (ref >60)
Glucose, Bld: 92 mg/dL (ref 70–99)
Potassium: 3.5 mmol/L (ref 3.5–5.1)
Sodium: 139 mmol/L (ref 135–145)
Total Bilirubin: 0.4 mg/dL (ref 0.3–1.2)
Total Protein: 7.6 g/dL (ref 6.5–8.1)

## 2021-10-22 LAB — URINALYSIS, ROUTINE W REFLEX MICROSCOPIC
Bacteria, UA: NONE SEEN
Bilirubin Urine: NEGATIVE
Glucose, UA: NEGATIVE mg/dL
Ketones, ur: NEGATIVE mg/dL
Leukocytes,Ua: NEGATIVE
Nitrite: NEGATIVE
Protein, ur: NEGATIVE mg/dL
Specific Gravity, Urine: 1.015 (ref 1.005–1.030)
pH: 5 (ref 5.0–8.0)

## 2021-10-22 LAB — SURGICAL PCR SCREEN
MRSA, PCR: NEGATIVE
Staphylococcus aureus: POSITIVE — AB

## 2021-10-22 LAB — PROTIME-INR
INR: 1 (ref 0.8–1.2)
Prothrombin Time: 13.1 seconds (ref 11.4–15.2)

## 2021-10-22 LAB — APTT: aPTT: 32 seconds (ref 24–36)

## 2021-10-22 NOTE — Progress Notes (Signed)
PCP - Dr. Karle Plumber Cardiologist - Dr. Ellyn Hack  PPM/ICD - Denies Device Orders - n/a Rep Notified - n/a  Chest x-ray - n/a EKG - 08/20/2021 Stress Test - n/a ECHO - 09/07/2017 Cardiac Cath - Recent Coronary CT 09/11/2021  Sleep Study - Denies CPAP - n/a  No DM - Per pt, Home Health recently told her she was pre-diabetic  Blood Thinner Instructions: Per pt, Dr. Nicole Cella office instructed to hold Pletal starting 8/24. She is aware that her last dose will be 8/23. Aspirin Instructions: Pt instructed to continue taking ASA through the morning of surgery. This was verified with Clinton Sawyer, RN with Dr. Nicole Cella office.  NPO after midnight  COVID TEST- n/a   Anesthesia review: Yes. Cardiac Hx. Recently cardiac clearance visit in Epic chart.  Patient denies shortness of breath, fever, cough and chest pain at PAT appointment   All instructions explained to the patient, with a verbal understanding of the material. Patient agrees to go over the instructions while at home for a better understanding. Patient also instructed to self quarantine after being tested for COVID-19. The opportunity to ask questions was provided.

## 2021-10-23 ENCOUNTER — Other Ambulatory Visit: Payer: Self-pay

## 2021-10-23 ENCOUNTER — Ambulatory Visit: Payer: Medicare Other | Admitting: Physician Assistant

## 2021-10-23 ENCOUNTER — Other Ambulatory Visit: Payer: Self-pay | Admitting: Internal Medicine

## 2021-10-23 DIAGNOSIS — I10 Essential (primary) hypertension: Secondary | ICD-10-CM

## 2021-10-23 DIAGNOSIS — C50912 Malignant neoplasm of unspecified site of left female breast: Secondary | ICD-10-CM | POA: Diagnosis not present

## 2021-10-23 MED ORDER — CARVEDILOL 6.25 MG PO TABS
ORAL_TABLET | Freq: Two times a day (BID) | ORAL | 1 refills | Status: DC
  Filled 2021-10-23 – 2021-11-07 (×3): qty 180, 90d supply, fill #0
  Filled 2022-01-06 – 2022-02-06 (×2): qty 180, 90d supply, fill #1

## 2021-10-28 ENCOUNTER — Other Ambulatory Visit: Payer: Self-pay

## 2021-10-29 ENCOUNTER — Other Ambulatory Visit: Payer: Self-pay

## 2021-10-29 ENCOUNTER — Encounter (HOSPITAL_COMMUNITY)
Admission: RE | Disposition: A | Discharge status: Home Health | Payer: Self-pay | Source: Home / Self Care | Attending: Vascular Surgery

## 2021-10-29 ENCOUNTER — Inpatient Hospital Stay (HOSPITAL_COMMUNITY): Payer: Medicare Other | Admitting: Physician Assistant

## 2021-10-29 ENCOUNTER — Inpatient Hospital Stay (HOSPITAL_COMMUNITY)
Admission: RE | Admit: 2021-10-29 | Discharge: 2021-11-03 | DRG: 254 | Disposition: A | Discharge status: Home Health | Payer: Medicare Other | Attending: Vascular Surgery | Admitting: Vascular Surgery

## 2021-10-29 ENCOUNTER — Inpatient Hospital Stay (HOSPITAL_COMMUNITY): Payer: Medicare Other | Admitting: Certified Registered Nurse Anesthetist

## 2021-10-29 ENCOUNTER — Encounter (HOSPITAL_COMMUNITY): Payer: Self-pay | Admitting: Vascular Surgery

## 2021-10-29 DIAGNOSIS — Z83438 Family history of other disorder of lipoprotein metabolism and other lipidemia: Secondary | ICD-10-CM

## 2021-10-29 DIAGNOSIS — Z885 Allergy status to narcotic agent status: Secondary | ICD-10-CM

## 2021-10-29 DIAGNOSIS — J449 Chronic obstructive pulmonary disease, unspecified: Secondary | ICD-10-CM | POA: Diagnosis present

## 2021-10-29 DIAGNOSIS — Z87891 Personal history of nicotine dependence: Secondary | ICD-10-CM | POA: Diagnosis not present

## 2021-10-29 DIAGNOSIS — R7303 Prediabetes: Secondary | ICD-10-CM | POA: Diagnosis present

## 2021-10-29 DIAGNOSIS — I70222 Atherosclerosis of native arteries of extremities with rest pain, left leg: Secondary | ICD-10-CM | POA: Diagnosis present

## 2021-10-29 DIAGNOSIS — K219 Gastro-esophageal reflux disease without esophagitis: Secondary | ICD-10-CM | POA: Diagnosis present

## 2021-10-29 DIAGNOSIS — Z882 Allergy status to sulfonamides status: Secondary | ICD-10-CM

## 2021-10-29 DIAGNOSIS — I7409 Other arterial embolism and thrombosis of abdominal aorta: Secondary | ICD-10-CM | POA: Diagnosis not present

## 2021-10-29 DIAGNOSIS — E785 Hyperlipidemia, unspecified: Secondary | ICD-10-CM | POA: Diagnosis present

## 2021-10-29 DIAGNOSIS — I10 Essential (primary) hypertension: Secondary | ICD-10-CM | POA: Diagnosis not present

## 2021-10-29 DIAGNOSIS — I700 Atherosclerosis of aorta: Secondary | ICD-10-CM | POA: Diagnosis not present

## 2021-10-29 DIAGNOSIS — Z888 Allergy status to other drugs, medicaments and biological substances status: Secondary | ICD-10-CM

## 2021-10-29 DIAGNOSIS — Z8249 Family history of ischemic heart disease and other diseases of the circulatory system: Secondary | ICD-10-CM | POA: Diagnosis not present

## 2021-10-29 DIAGNOSIS — R531 Weakness: Secondary | ICD-10-CM | POA: Diagnosis not present

## 2021-10-29 HISTORY — PX: AXILLARY-FEMORAL BYPASS GRAFT: SHX894

## 2021-10-29 LAB — ABO/RH: ABO/RH(D): B POS

## 2021-10-29 LAB — POCT ACTIVATED CLOTTING TIME
Activated Clotting Time: 239 seconds
Activated Clotting Time: 293 seconds

## 2021-10-29 LAB — PREPARE RBC (CROSSMATCH)

## 2021-10-29 SURGERY — CREATION, BYPASS, ARTERIAL, AXILLARY TO BILATERAL FEMORAL, USING GRAFT
Anesthesia: General | Site: Groin | Laterality: Bilateral

## 2021-10-29 MED ORDER — METOPROLOL TARTRATE 5 MG/5ML IV SOLN: 2.0000 mg | INTRAVENOUS | Status: DC | PRN

## 2021-10-29 MED ORDER — STERILE WATER FOR IRRIGATION IR SOLN
Status: DC | PRN
  Administered 2021-10-29: 1000 mL

## 2021-10-29 MED ORDER — ACETAMINOPHEN 325 MG RE SUPP: 325.0000 mg | RECTAL | Status: DC | PRN

## 2021-10-29 MED ORDER — 0.9 % SODIUM CHLORIDE (POUR BTL) OPTIME
TOPICAL | Status: DC | PRN
  Administered 2021-10-29 (×2): 1000 mL

## 2021-10-29 MED ORDER — DEXAMETHASONE SODIUM PHOSPHATE 10 MG/ML IJ SOLN
INTRAMUSCULAR | Status: DC | PRN
  Administered 2021-10-29: 5 mg via INTRAVENOUS

## 2021-10-29 MED ORDER — CARVEDILOL 6.25 MG PO TABS
6.2500 mg | ORAL_TABLET | Freq: Two times a day (BID) | ORAL | Status: DC
  Administered 2021-10-29 – 2021-11-03 (×10): 6.25 mg via ORAL
  Filled 2021-10-29 (×11): qty 1

## 2021-10-29 MED ORDER — HEPARIN SODIUM (PORCINE) 1000 UNIT/ML IJ SOLN
INTRAMUSCULAR | Status: AC
  Filled 2021-10-29: qty 10

## 2021-10-29 MED ORDER — FENTANYL CITRATE (PF) 100 MCG/2ML IJ SOLN
INTRAMUSCULAR | Status: AC
  Filled 2021-10-29: qty 2

## 2021-10-29 MED ORDER — OXYCODONE-ACETAMINOPHEN 5-325 MG PO TABS
ORAL_TABLET | ORAL | Status: AC
  Filled 2021-10-29: qty 2

## 2021-10-29 MED ORDER — HEPARIN SODIUM (PORCINE) 1000 UNIT/ML IJ SOLN
INTRAMUSCULAR | Status: DC | PRN
  Administered 2021-10-29: 1000 [IU] via INTRAVENOUS
  Administered 2021-10-29: 8500 [IU] via INTRAVENOUS

## 2021-10-29 MED ORDER — LIDOCAINE 2% (20 MG/ML) 5 ML SYRINGE
INTRAMUSCULAR | Status: DC | PRN
  Administered 2021-10-29: 60 mg via INTRAVENOUS

## 2021-10-29 MED ORDER — CHLORHEXIDINE GLUCONATE CLOTH 2 % EX PADS: 6.0000 | MEDICATED_PAD | Freq: Once | CUTANEOUS | Status: DC

## 2021-10-29 MED ORDER — ONDANSETRON HCL 4 MG/2ML IJ SOLN
INTRAMUSCULAR | Status: DC | PRN
  Administered 2021-10-29: 4 mg via INTRAVENOUS

## 2021-10-29 MED ORDER — HYDRALAZINE HCL 20 MG/ML IJ SOLN: 5.0000 mg | INTRAMUSCULAR | Status: DC | PRN

## 2021-10-29 MED ORDER — NYSTATIN 100000 UNIT/ML MT SUSP
5.0000 mL | Freq: Four times a day (QID) | OROMUCOSAL | Status: DC | PRN
Start: 2021-10-29 — End: 2021-11-03

## 2021-10-29 MED ORDER — MIDAZOLAM HCL 2 MG/2ML IJ SOLN
INTRAMUSCULAR | Status: DC | PRN
  Administered 2021-10-29: .5 mg via INTRAVENOUS
  Administered 2021-10-29: 1 mg via INTRAVENOUS
  Administered 2021-10-29: .5 mg via INTRAVENOUS

## 2021-10-29 MED ORDER — SODIUM CHLORIDE 0.9 % IV SOLN: INTRAVENOUS | Status: DC

## 2021-10-29 MED ORDER — MORPHINE SULFATE (PF) 2 MG/ML IV SOLN: 2.0000 mg | INTRAVENOUS | Status: DC | PRN

## 2021-10-29 MED ORDER — PROTAMINE SULFATE 10 MG/ML IV SOLN
INTRAVENOUS | Status: AC
  Filled 2021-10-29: qty 5

## 2021-10-29 MED ORDER — ALBUMIN HUMAN 5 % IV SOLN: INTRAVENOUS | Status: DC | PRN

## 2021-10-29 MED ORDER — GUAIFENESIN-DM 100-10 MG/5ML PO SYRP
15.0000 mL | ORAL_SOLUTION | ORAL | Status: DC | PRN
Start: 2021-10-29 — End: 2021-11-03

## 2021-10-29 MED ORDER — MAGNESIUM SULFATE 2 GM/50ML IV SOLN: 2.0000 g | Freq: Every day | INTRAVENOUS | Status: DC | PRN

## 2021-10-29 MED ORDER — LABETALOL HCL 5 MG/ML IV SOLN: 10.0000 mg | INTRAVENOUS | Status: DC | PRN

## 2021-10-29 MED ORDER — ONDANSETRON HCL 4 MG/2ML IJ SOLN
INTRAMUSCULAR | Status: AC
Start: 2021-10-29 — End: ?
  Filled 2021-10-29: qty 2

## 2021-10-29 MED ORDER — CEFAZOLIN SODIUM-DEXTROSE 2-4 GM/100ML-% IV SOLN
2.0000 g | INTRAVENOUS | Status: AC
  Administered 2021-10-29: 2 g via INTRAVENOUS
  Filled 2021-10-29: qty 100

## 2021-10-29 MED ORDER — FENTANYL CITRATE (PF) 250 MCG/5ML IJ SOLN
INTRAMUSCULAR | Status: AC
  Filled 2021-10-29: qty 5

## 2021-10-29 MED ORDER — ACETAMINOPHEN 325 MG PO TABS: 325.0000 mg | ORAL_TABLET | ORAL | Status: DC | PRN

## 2021-10-29 MED ORDER — POTASSIUM CHLORIDE CRYS ER 20 MEQ PO TBCR: 20.0000 meq | EXTENDED_RELEASE_TABLET | Freq: Every day | ORAL | Status: DC | PRN

## 2021-10-29 MED ORDER — AMISULPRIDE (ANTIEMETIC) 5 MG/2ML IV SOLN: 10.0000 mg | Freq: Once | INTRAVENOUS | Status: DC | PRN

## 2021-10-29 MED ORDER — HEPARIN 6000 UNIT IRRIGATION SOLUTION
Status: AC
Start: 2021-10-29 — End: ?
  Filled 2021-10-29: qty 500

## 2021-10-29 MED ORDER — POTASSIUM CHLORIDE CRYS ER 10 MEQ PO TBCR
20.0000 meq | EXTENDED_RELEASE_TABLET | Freq: Every day | ORAL | Status: DC
  Administered 2021-10-30 – 2021-11-03 (×5): 20 meq via ORAL
  Filled 2021-10-29 (×6): qty 2

## 2021-10-29 MED ORDER — LISINOPRIL 20 MG PO TABS
40.0000 mg | ORAL_TABLET | Freq: Every day | ORAL | Status: DC
  Administered 2021-10-30 – 2021-10-31 (×2): 40 mg via ORAL
  Filled 2021-10-29 (×4): qty 2

## 2021-10-29 MED ORDER — ROCURONIUM BROMIDE 10 MG/ML (PF) SYRINGE
PREFILLED_SYRINGE | INTRAVENOUS | Status: DC | PRN
  Administered 2021-10-29: 50 mg via INTRAVENOUS
  Administered 2021-10-29: 30 mg via INTRAVENOUS

## 2021-10-29 MED ORDER — LIDOCAINE 2% (20 MG/ML) 5 ML SYRINGE
INTRAMUSCULAR | Status: AC
  Filled 2021-10-29: qty 5

## 2021-10-29 MED ORDER — PROPOFOL 10 MG/ML IV BOLUS
INTRAVENOUS | Status: AC
  Filled 2021-10-29: qty 20

## 2021-10-29 MED ORDER — GABAPENTIN 300 MG PO CAPS
300.0000 mg | ORAL_CAPSULE | Freq: Every day | ORAL | Status: DC
  Administered 2021-10-29 – 2021-11-02 (×5): 300 mg via ORAL
  Filled 2021-10-29 (×5): qty 1

## 2021-10-29 MED ORDER — SODIUM CHLORIDE 0.9% IV SOLUTION: Freq: Once | INTRAVENOUS | Status: DC

## 2021-10-29 MED ORDER — HEPARIN 6000 UNIT IRRIGATION SOLUTION
Status: DC | PRN
  Administered 2021-10-29: 1

## 2021-10-29 MED ORDER — SPIRONOLACTONE 25 MG PO TABS
25.0000 mg | ORAL_TABLET | Freq: Every day | ORAL | Status: DC
  Administered 2021-10-30 – 2021-11-03 (×5): 25 mg via ORAL
  Filled 2021-10-29 (×5): qty 1

## 2021-10-29 MED ORDER — CEFAZOLIN SODIUM-DEXTROSE 2-4 GM/100ML-% IV SOLN
2.0000 g | Freq: Three times a day (TID) | INTRAVENOUS | Status: AC
  Administered 2021-10-29 – 2021-10-30 (×2): 2 g via INTRAVENOUS
  Filled 2021-10-29 (×2): qty 100

## 2021-10-29 MED ORDER — ASPIRIN 81 MG PO TBEC
81.0000 mg | DELAYED_RELEASE_TABLET | Freq: Every day | ORAL | Status: DC
  Administered 2021-10-30 – 2021-11-03 (×5): 81 mg via ORAL
  Filled 2021-10-29 (×5): qty 1

## 2021-10-29 MED ORDER — AMLODIPINE BESYLATE 10 MG PO TABS
10.0000 mg | ORAL_TABLET | Freq: Every day | ORAL | Status: DC
  Administered 2021-10-29 – 2021-10-31 (×3): 10 mg via ORAL
  Filled 2021-10-29 (×5): qty 1

## 2021-10-29 MED ORDER — PHENYLEPHRINE HCL-NACL 20-0.9 MG/250ML-% IV SOLN
INTRAVENOUS | Status: DC | PRN
  Administered 2021-10-29: 30 ug/min via INTRAVENOUS

## 2021-10-29 MED ORDER — EPHEDRINE SULFATE (PRESSORS) 50 MG/ML IJ SOLN
INTRAMUSCULAR | Status: DC | PRN
  Administered 2021-10-29: 2.5 mg via INTRAVENOUS

## 2021-10-29 MED ORDER — OXYCODONE-ACETAMINOPHEN 5-325 MG PO TABS
1.0000 | ORAL_TABLET | ORAL | Status: DC | PRN
  Administered 2021-10-29 – 2021-10-31 (×6): 2 via ORAL
  Administered 2021-10-31 (×2): 1 via ORAL
  Administered 2021-11-01 – 2021-11-02 (×6): 2 via ORAL
  Administered 2021-11-03: 1 via ORAL
  Filled 2021-10-29 (×2): qty 1
  Filled 2021-10-29 (×4): qty 2
  Filled 2021-10-29: qty 1
  Filled 2021-10-29 (×5): qty 2

## 2021-10-29 MED ORDER — PROPOFOL 10 MG/ML IV BOLUS
INTRAVENOUS | Status: DC | PRN
  Administered 2021-10-29: 120 mg via INTRAVENOUS

## 2021-10-29 MED ORDER — ALUM & MAG HYDROXIDE-SIMETH 200-200-20 MG/5ML PO SUSP: 15.0000 mL | ORAL | Status: DC | PRN

## 2021-10-29 MED ORDER — SODIUM CHLORIDE 0.9 % IV SOLN
500.0000 mL | Freq: Once | INTRAVENOUS | Status: AC | PRN
  Administered 2021-10-30 (×2): 500 mL via INTRAVENOUS

## 2021-10-29 MED ORDER — LACTATED RINGERS IV SOLN: INTRAVENOUS | Status: DC | PRN

## 2021-10-29 MED ORDER — PAPAVERINE HCL 30 MG/ML IJ SOLN
INTRAMUSCULAR | Status: AC
Start: 2021-10-29 — End: ?
  Filled 2021-10-29: qty 2

## 2021-10-29 MED ORDER — POLYETHYLENE GLYCOL 3350 17 G PO PACK
17.0000 g | PACK | Freq: Every day | ORAL | Status: DC | PRN
Start: 2021-10-29 — End: 2021-11-03

## 2021-10-29 MED ORDER — BISACODYL 10 MG RE SUPP: 10.0000 mg | Freq: Every day | RECTAL | Status: DC | PRN

## 2021-10-29 MED ORDER — HEPARIN SODIUM (PORCINE) 5000 UNIT/ML IJ SOLN
5000.0000 [IU] | Freq: Three times a day (TID) | INTRAMUSCULAR | Status: DC
  Administered 2021-10-30 – 2021-11-03 (×13): 5000 [IU] via SUBCUTANEOUS
  Filled 2021-10-29 (×13): qty 1

## 2021-10-29 MED ORDER — HYDRALAZINE HCL 25 MG PO TABS
25.0000 mg | ORAL_TABLET | Freq: Two times a day (BID) | ORAL | Status: DC
  Administered 2021-10-29 – 2021-11-01 (×6): 25 mg via ORAL
  Filled 2021-10-29 (×6): qty 1

## 2021-10-29 MED ORDER — ACETAMINOPHEN 500 MG PO TABS: 1000.0000 mg | ORAL_TABLET | Freq: Once | ORAL | Status: DC

## 2021-10-29 MED ORDER — FENTANYL CITRATE (PF) 100 MCG/2ML IJ SOLN
25.0000 ug | INTRAMUSCULAR | Status: DC | PRN
  Administered 2021-10-29 (×3): 50 ug via INTRAVENOUS

## 2021-10-29 MED ORDER — PHENOL 1.4 % MT LIQD: 1.0000 | OROMUCOSAL | Status: DC | PRN

## 2021-10-29 MED ORDER — DEXAMETHASONE SODIUM PHOSPHATE 10 MG/ML IJ SOLN
INTRAMUSCULAR | Status: AC
  Filled 2021-10-29: qty 1

## 2021-10-29 MED ORDER — FLUTICASONE FUROATE-VILANTEROL 200-25 MCG/ACT IN AEPB
1.0000 | INHALATION_SPRAY | Freq: Every day | RESPIRATORY_TRACT | Status: DC
  Administered 2021-10-30 – 2021-11-03 (×5): 1 via RESPIRATORY_TRACT
  Filled 2021-10-29: qty 28

## 2021-10-29 MED ORDER — ALBUTEROL SULFATE HFA 108 (90 BASE) MCG/ACT IN AERS: 2.0000 | INHALATION_SPRAY | Freq: Four times a day (QID) | RESPIRATORY_TRACT | Status: DC | PRN

## 2021-10-29 MED ORDER — ROCURONIUM BROMIDE 10 MG/ML (PF) SYRINGE
PREFILLED_SYRINGE | INTRAVENOUS | Status: AC
  Filled 2021-10-29: qty 10

## 2021-10-29 MED ORDER — HEMOSTATIC AGENTS (NO CHARGE) OPTIME
TOPICAL | Status: DC | PRN
  Administered 2021-10-29: 1 via TOPICAL

## 2021-10-29 MED ORDER — CHLORHEXIDINE GLUCONATE 0.12 % MT SOLN
OROMUCOSAL | Status: AC
  Administered 2021-10-29: 15 mL
  Filled 2021-10-29: qty 15

## 2021-10-29 MED ORDER — FENTANYL CITRATE (PF) 250 MCG/5ML IJ SOLN
INTRAMUSCULAR | Status: DC | PRN
Start: 2021-10-29 — End: 2021-10-29
  Administered 2021-10-29 (×2): 50 ug via INTRAVENOUS
  Administered 2021-10-29: 25 ug via INTRAVENOUS
  Administered 2021-10-29: 50 ug via INTRAVENOUS
  Administered 2021-10-29: 75 ug via INTRAVENOUS
  Administered 2021-10-29: 50 ug via INTRAVENOUS

## 2021-10-29 MED ORDER — PHENYLEPHRINE 80 MCG/ML (10ML) SYRINGE FOR IV PUSH (FOR BLOOD PRESSURE SUPPORT)
PREFILLED_SYRINGE | INTRAVENOUS | Status: AC
  Filled 2021-10-29: qty 10

## 2021-10-29 MED ORDER — CILOSTAZOL 50 MG PO TABS
50.0000 mg | ORAL_TABLET | Freq: Every day | ORAL | Status: DC
  Administered 2021-10-30 – 2021-11-03 (×5): 50 mg via ORAL
  Filled 2021-10-29 (×5): qty 1

## 2021-10-29 MED ORDER — MIDAZOLAM HCL 2 MG/2ML IJ SOLN
INTRAMUSCULAR | Status: AC
  Filled 2021-10-29: qty 2

## 2021-10-29 MED ORDER — ONDANSETRON HCL 4 MG/2ML IJ SOLN
4.0000 mg | Freq: Four times a day (QID) | INTRAMUSCULAR | Status: DC | PRN
Start: 2021-10-29 — End: 2021-11-03

## 2021-10-29 MED ORDER — ATORVASTATIN CALCIUM 40 MG PO TABS
40.0000 mg | ORAL_TABLET | Freq: Every day | ORAL | Status: DC
  Administered 2021-10-29: 40 mg via ORAL
  Filled 2021-10-29: qty 1

## 2021-10-29 MED ORDER — PROTAMINE SULFATE 10 MG/ML IV SOLN
INTRAVENOUS | Status: DC | PRN
  Administered 2021-10-29: 50 mg via INTRAVENOUS

## 2021-10-29 MED ORDER — PANTOPRAZOLE SODIUM 40 MG PO TBEC
40.0000 mg | DELAYED_RELEASE_TABLET | Freq: Every day | ORAL | Status: DC
  Administered 2021-10-30 – 2021-11-03 (×5): 40 mg via ORAL
  Filled 2021-10-29 (×5): qty 1

## 2021-10-29 MED ORDER — SUGAMMADEX SODIUM 200 MG/2ML IV SOLN
INTRAVENOUS | Status: DC | PRN
  Administered 2021-10-29: 200 mg via INTRAVENOUS

## 2021-10-29 MED ORDER — ALBUTEROL SULFATE (2.5 MG/3ML) 0.083% IN NEBU
2.5000 mg | INHALATION_SOLUTION | Freq: Four times a day (QID) | RESPIRATORY_TRACT | Status: DC | PRN
Start: 2021-10-29 — End: 2021-11-03

## 2021-10-29 MED ORDER — DOCUSATE SODIUM 100 MG PO CAPS
100.0000 mg | ORAL_CAPSULE | Freq: Every day | ORAL | Status: DC
  Administered 2021-10-30 – 2021-11-03 (×4): 100 mg via ORAL
  Filled 2021-10-29 (×4): qty 1

## 2021-10-29 SURGICAL SUPPLY — 52 items
ADH SKN CLS APL DERMABOND .7 (GAUZE/BANDAGES/DRESSINGS) ×2
AGENT HMST KT MTR STRL THRMB (HEMOSTASIS) ×1
BAG COUNTER SPONGE SURGICOUNT (BAG) ×2 IMPLANT
BAG SPNG CNTER NS LX DISP (BAG) ×1
CANISTER SUCT 3000ML PPV (MISCELLANEOUS) ×2 IMPLANT
CANNULA VESSEL 3MM 2 BLNT TIP (CANNULA) ×4 IMPLANT
CLIP VESOCCLUDE MED 24/CT (CLIP) ×2 IMPLANT
CLIP VESOCCLUDE SM WIDE 24/CT (CLIP) ×2 IMPLANT
CONNECTOR Y WND VAC (MISCELLANEOUS) IMPLANT
COVER PROBE W GEL 5X96 (DRAPES) IMPLANT
DERMABOND ADVANCED (GAUZE/BANDAGES/DRESSINGS) ×2
DERMABOND ADVANCED .7 DNX12 (GAUZE/BANDAGES/DRESSINGS) ×2 IMPLANT
DRAPE INCISE IOBAN 66X45 STRL (DRAPES) ×2 IMPLANT
DRESSING PEEL AND PLC PRVNA 13 (GAUZE/BANDAGES/DRESSINGS) IMPLANT
DRSG PEEL AND PLACE PREVENA 13 (GAUZE/BANDAGES/DRESSINGS) ×2
ELECT CAUTERY BLADE 6.4 (BLADE) IMPLANT
ELECT REM PT RETURN 9FT ADLT (ELECTROSURGICAL) ×2
ELECTRODE REM PT RTRN 9FT ADLT (ELECTROSURGICAL) ×2 IMPLANT
GAUZE 4X4 16PLY ~~LOC~~+RFID DBL (SPONGE) IMPLANT
GLOVE BIO SURGEON STRL SZ 6.5 (GLOVE) IMPLANT
GLOVE BIO SURGEON STRL SZ7.5 (GLOVE) ×2 IMPLANT
GLOVE BIOGEL PI IND STRL 8 (GLOVE) ×2 IMPLANT
GLOVE BIOGEL PI INDICATOR 8 (GLOVE) ×2
GLOVE SURG POLY ORTHO LF SZ7.5 (GLOVE) IMPLANT
GLOVE SURG UNDER LTX SZ8 (GLOVE) ×2 IMPLANT
GOWN STRL REUS W/ TWL LRG LVL3 (GOWN DISPOSABLE) ×6 IMPLANT
GOWN STRL REUS W/TWL LRG LVL3 (GOWN DISPOSABLE) ×3
GRAFT PROPATEN W/RING 8X80X70 (Vascular Products) IMPLANT
INSERT FOGARTY SM (MISCELLANEOUS) IMPLANT
KIT BASIN OR (CUSTOM PROCEDURE TRAY) ×2 IMPLANT
KIT TURNOVER KIT B (KITS) ×2 IMPLANT
NS IRRIG 1000ML POUR BTL (IV SOLUTION) ×4 IMPLANT
PACK PERIPHERAL VASCULAR (CUSTOM PROCEDURE TRAY) ×2 IMPLANT
PAD ARMBOARD 7.5X6 YLW CONV (MISCELLANEOUS) ×4 IMPLANT
PENCIL BUTTON HOLSTER BLD 10FT (ELECTRODE) IMPLANT
SPONGE T-LAP 18X18 ~~LOC~~+RFID (SPONGE) IMPLANT
STAPLER VISISTAT 35W (STAPLE) IMPLANT
SURGIFLO W/THROMBIN 8M KIT (HEMOSTASIS) IMPLANT
SUT GORETEX 6.0 TT9 (SUTURE) IMPLANT
SUT MNCRL AB 4-0 PS2 18 (SUTURE) ×4 IMPLANT
SUT PROLENE 5 0 C 1 24 (SUTURE) ×2 IMPLANT
SUT PROLENE 6 0 BV (SUTURE) ×2 IMPLANT
SUT SILK 2 0 SH (SUTURE) IMPLANT
SUT SILK 3 0 (SUTURE) ×1
SUT SILK 3-0 18XBRD TIE 12 (SUTURE) IMPLANT
SUT VIC AB 2-0 CTB1 (SUTURE) ×4 IMPLANT
SUT VIC AB 3-0 SH 27 (SUTURE) ×4
SUT VIC AB 3-0 SH 27X BRD (SUTURE) ×4 IMPLANT
TOWEL GREEN STERILE (TOWEL DISPOSABLE) ×2 IMPLANT
TRAY FOLEY MTR SLVR 16FR STAT (SET/KITS/TRAYS/PACK) ×2 IMPLANT
WATER STERILE IRR 1000ML POUR (IV SOLUTION) ×2 IMPLANT
WND VAC CONN Y (MISCELLANEOUS) ×1

## 2021-10-29 NOTE — Progress Notes (Signed)
Care assumed at 2036. Pulses audible with doppler. Patient reports no pain. I bed resting. Will continue to monitor.

## 2021-10-29 NOTE — Discharge Instructions (Signed)
[facility]  Discharge instructions  Bypass Surgery  Please refer to the following instruction for your post-procedure care. Your surgeon or physician assistant will discuss any changes with you.  Activity  You are encouraged to walk as much as you can. You can slowly return to normal activities during the month after your surgery. Avoid strenuous activity and heavy lifting until your doctor tells you it's OK. Avoid activities such as vacuuming or swinging a golf club. Do not drive until your doctor give the OK and you are no longer taking prescription pain medications. It is also normal to have difficulty with sleep habits, eating and bowel movement after surgery. These will go away with time.  Bathing/Showering  Do not soak in a bathtub, hot tub, or swim until the incision heals completely.  Do not shower until the incisional wound vacs are removed.   Incision Care  Clean your incision with mild soap and water. Shower every day. Pat the area dry with a clean towel. You do not need a bandage unless otherwise instructed. Do not apply any ointments or creams to your incision. If you have open wounds you will be instructed how to care for them or a visiting nurse may be arranged for you. If you have staples or sutures along your incision they will be removed at your post-op appointment. You may have skin glue on your incision. Do not peel it off. It will come off on its own in about one week.  Keep Pravena wound vac on your groin incision until it loses it seal in about 7-10 days.  Once that happens, you can remove and then wash the groin wound with soap and water daily and pat dry. (No tub bath-only shower)  Then put a dry gauze or washcloth in the groin to keep this area dry to help prevent wound infection.  Do this daily and as needed.  Do not use Vaseline or neosporin on your incisions.  Only use soap and water on your incisions and then protect and keep  dry.   Diet  Resume your normal diet. There are no special food restrictions following this procedure. A low fat/ low cholesterol diet is recommended for all patients with vascular disease. In order to heal from your surgery, it is CRITICAL to get adequate nutrition. Your body requires vitamins, minerals, and protein. Vegetables are the best source of vitamins and minerals. Vegetables also provide the perfect balance of protein. Processed food has little nutritional value, so try to avoid this.  Medications  Resume taking all your medications unless your doctor or physician assistant tells you not to. If your incision is causing pain, you may take over-the-counter pain relievers such as acetaminophen (Tylenol). If you were prescribed a stronger pain medication, please aware these medication can cause nausea and constipation. Prevent nausea by taking the medication with a snack or meal. Avoid constipation by drinking plenty of fluids and eating foods with high amount of fiber, such as fruits, vegetables, and grains. Take Colace 100 mg (an over-the-counter stool softener) twice a day as needed for constipation.  Do not take Tylenol if you are taking prescription pain medications.  Follow Up  Our office will schedule a follow up appointment 2-3 weeks following discharge.  Please call us immediately for any of the following conditions  Severe or worsening pain in your legs or feet while at rest or while walking Increase pain, redness, warmth, or drainage (pus) from your incision  site(s) Fever of 101 degree or higher The swelling in your leg with the bypass suddenly worsens and becomes more painful than when you were in the hospital If you have been instructed to feel your graft pulse then you should do so every day. If you can no longer feel this pulse, call the office immediately. Not all patients are given this instruction.  Leg swelling is common after bypass surgery.  The swelling should  improve over a few months following surgery. To improve the swelling, you may elevate your legs above the level of your heart while you are sitting or resting. Your surgeon or physician assistant may ask you to apply an ACE wrap or wear compression (TED) stockings to help to reduce swelling.  Reduce your risk of vascular disease  Stop smoking. If you would like help call [facility] at 1-800-QUIT-NOW (215) 654-9504) or [place] at (213) 880-9783.  Manage your cholesterol Maintain a desired weight Control your diabetes weight Control your diabetes Keep your blood pressure down  If you have any questions, please call the office at [phone]

## 2021-10-29 NOTE — Anesthesia Procedure Notes (Signed)
Arterial Line Insertion Start/End8/29/2023 7:25 AM, 10/29/2021 7:25 AM Performed by: Suzette Battiest, MD, anesthesiologist  Patient location: Pre-op. Preanesthetic checklist: patient identified, IV checked, site marked, risks and benefits discussed, surgical consent, monitors and equipment checked, pre-op evaluation, timeout performed and anesthesia consent Lidocaine 1% used for infiltration and patient sedated Right, radial was placed Catheter size: 20 G Hand hygiene performed  and maximum sterile barriers used   Attempts: 1 Procedure performed using ultrasound guided technique. Ultrasound Notes:anatomy identified, needle tip was noted to be adjacent to the nerve/plexus identified, no ultrasound evidence of intravascular and/or intraneural injection and image(s) printed for medical record Following insertion, dressing applied and Biopatch. Post procedure assessment: normal  Patient tolerated the procedure well with no immediate complications. Additional procedure comments: Unsuccessful attempts by CRNA x2, ultrasound needed. [name]

## 2021-10-29 NOTE — Anesthesia Postprocedure Evaluation (Signed)
Anesthesia Post Note  Patient: Brandy Stevenson  Procedure(s) Performed: LEFT AXILLOBIFEMORAL BYPASS GRAFT (Bilateral: Groin)     Patient location during evaluation: PACU Anesthesia Type: General Level of consciousness: awake and alert Pain management: pain level controlled Vital Signs Assessment: post-procedure vital signs reviewed and stable Respiratory status: spontaneous breathing, nonlabored ventilation, respiratory function stable and patient connected to nasal cannula oxygen Cardiovascular status: blood pressure returned to baseline and stable Postop Assessment: no apparent nausea or vomiting Anesthetic complications: no   No notable events documented.  Last Vitals:  Vitals:   10/29/21 1445 10/29/21 1515  BP: 135/76 (!) 142/81  Pulse: (!) 58 61  Resp: 12 13  Temp:    SpO2: 99% 100%    Last Pain:  Vitals:   10/29/21 1500  TempSrc:   PainSc: 7                  Tiajuana Amass

## 2021-10-29 NOTE — H&P (Signed)
ASSESSMENT & PLAN   AORTOILIAC OCCLUSIVE DISEASE: This patient has presented with progressive ischemia of her left lower extremity now with rest pain.  She has a known aortic occlusion with reconstitution of her external iliac arteries.  She does have infrainguinal arterial occlusive disease bilaterally.  Her aorta is heavily calcified up to the renals.  In addition she has COPD.  After careful consideration I felt that axillobifemoral bypass grafting, was the safest option for revascularization given that this is now becoming a limb threatening problem.  We have previously discussed the indications for the procedure and the potential complications of surgery and she is agreeable to proceed.  The patient did undergo preoperative cardiac evaluation.  She had a CT coronary on 09/11/2021 which showed no no significant problems.  REASON FOR ADMISSION:    For left axillobifemoral bypass  HPI:   Brandy Stevenson is a 66 y.o. female who I have been following with a known aortic occlusion.  She presented with progressive ischemia now with rest pain in the left foot.  She has a heavily calcified aorta up to the renals and also COPD.  We have tried conservative treatment but her symptoms have progressed.  She presents for axillobifemoral bypass grafting.  She has significant rest pain in the left foot.  She describes bilateral lower extremity claudication in a very short distance.  She denies any recent chest pain or chest pressure.  She does have a history of COPD and is on inhalers.  She quit smoking in 2018.  She had a previous appendectomy.   Past Medical History:  Diagnosis Date   Arthritis    back problems to be evaluated by neurology next month   Cancer [facility]) 2009   L, Breast, radiation and surgery.   COPD (chronic obstructive pulmonary disease) (HCC)    Depression    GERD (gastroesophageal reflux disease)    Hyperlipemia    Hypertension    Leriche syndrome (HCC)    Infrarenal  aortic occlusion extending to bilateral internal iliac arteries.  External iliacs fill via collaterals from lumbar arteries to hypogastric/internal iliac arteries -minimal disease on the right side and no disease on the left side leg arteries distally.   Peripheral vascular disease ([place]) 2013   Pre-diabetes     Family History  Problem Relation Age of Onset   Hyperlipidemia Mother    Hypertension Mother    Heart disease Mother        Atrial Fib.   Hyperlipidemia Sister    Hypertension Sister    Heart disease Sister        Before age 63,  CHF   Heart attack Brother    Hyperlipidemia Brother    Hypertension Brother    Heart disease Brother    Hypertension Daughter     SOCIAL HISTORY: Social History   Tobacco Use   Smoking status: Former    Packs/day: 0.25    Years: 40.00    Total pack years: 10.00    Types: Cigarettes    Quit date: 02/08/2017    Years since quitting: 4.7    Passive exposure: Never   Smokeless tobacco: Never  Substance Use Topics   Alcohol use: No    Allergies  Allergen Reactions   Vicodin [Hydrocodone-Acetaminophen] Nausea And Vomiting and Other (See Comments)    Sweating   Bactrim [Sulfamethoxazole-Trimethoprim] Nausea And Vomiting    HA    Incruse Ellipta [Umeclidinium Bromide] Other (See Comments)    Dry mouth  Tramadol Nausea And Vomiting   Wellbutrin [Bupropion] Other (See Comments)    Made me feel bad and sleepy    Current Facility-Administered Medications  Medication Dose Route Frequency Provider Last Rate Last Admin   0.9 %  sodium chloride infusion   Intravenous Continuous [name], MD       acetaminophen (TYLENOL) tablet 1,000 mg  1,000 mg Oral Once Suzette Battiest, MD       ceFAZolin (ANCEF) IVPB 2g/100 mL premix  2 g Intravenous 30 min Pre-Op [name], MD       Chlorhexidine Gluconate Cloth 2 % PADS 6 each  6 each Topical Once [name], MD       And   Chlorhexidine Gluconate Cloth 2 %  PADS 6 each  6 each Topical Once [name], MD        REVIEW OF SYSTEMS:  '[X]'$  denotes positive finding, '[ ]'$  denotes negative finding Cardiac  Comments:  Chest pain or chest pressure:    Shortness of breath upon exertion:    Short of breath when lying flat:    Irregular heart rhythm:        Vascular    Pain in calf, thigh, or hip brought on by ambulation: xx   Pain in feet at night that wakes you up from your sleep:     Blood clot in your veins:    Leg swelling:         Pulmonary    Oxygen at home:    Productive cough:     Wheezing:  x       Neurologic    Sudden weakness in arms or legs:     Sudden numbness in arms or legs:     Sudden onset of difficulty speaking or slurred speech:    Temporary loss of vision in one eye:     Problems with dizziness:         Gastrointestinal    Blood in stool:     Vomited blood:         Genitourinary    Burning when urinating:     Blood in urine:        Psychiatric    Major depression:         Hematologic    Bleeding problems:    Problems with blood clotting too easily:        Skin    Rashes or ulcers:        Constitutional    Fever or chills:    -  PHYSICAL EXAM:   Vitals:   10/29/21 0540  BP: (!) 148/69  Pulse: 64  Resp: 17  Temp: 98.7 F (37.1 C)  TempSrc: Oral  SpO2: 98%  Weight: 83.5 kg  Height: '5\' 5"'$  (1.651 m)   Body mass index is 30.62 kg/m. GENERAL: The patient is a well-nourished female, in no acute distress. The vital signs are documented above. CARDIAC: There is a regular rate and rhythm.  VASCULAR: I do not detect carotid bruits. I do not detect supraclavicular bruits. I cannot palpate femoral pulses. She has monophasic Doppler signals in both feet.  PULMONARY: There is good air exchange bilaterally without wheezing or rales. ABDOMEN: Soft and non-tender with normal pitched bowel sounds.  MUSCULOSKELETAL: There are no major deformities. NEUROLOGIC: No focal weakness or paresthesias  are detected. SKIN: There are no ulcers or rashes noted. PSYCHIATRIC: The patient has a normal affect.  DATA:    UPPER EXTREMITY  ARTERIAL DUPLEX: I have reviewed her upper extremity arterial duplex scan.   On the right side she had multiphasic signals throughout the entire right upper extremity.   On the left side she had multiphasic signals throughout the entire upper extremity.   There was no evidence of significant upper extremity arterial occlusive disease bilaterally.   CT ANGIOGRAM: I reviewed the CT angiogram that was done on 08/07/2021.  She has an aortic occlusion.  The aorta is heavily calcified up to the renals.  There is reconstitution of her external iliac arteries via the hypogastrics.  She has some moderate infrainguinal arterial occlusive disease bilaterally.  On the left side she does have three-vessel runoff.  On the right side she has runoff via the anterior tibial artery.  ARTERIAL DOPPLER STUDY: I have reviewed her arterial Doppler study that was done on 08/02/2021.  On the right side she had a biphasic posterior tibial signal and a monophasic dorsalis pedis signal.  ABI was 55%.  Toe pressure 26 mmHg.  On the left side she had a biphasic dorsalis pedis and posterior tibial signal.  ABI was 68% toe pressure was 47 mmHg.   Deitra Mayo [facility]

## 2021-10-29 NOTE — Op Note (Signed)
NAME: Brandy Stevenson    MRN: [id number] DOB: 07-13-1955    DATE OF OPERATION: 10/29/2021  PREOP DIAGNOSIS:    Aortic occlusion with rest pain  POSTOP DIAGNOSIS:    Same  PROCEDURE:    Left axillobifemoral bypass  SURGEON: Judeth Cornfield. Scot Dock, MD  ASSIST: Melene Muller MD, Leontine Locket, [place]  ANESTHESIA: General  EBL: Min mL  INDICATIONS:    Brandy Stevenson is a 66 y.o. female with a known aortic occlusion.  She presented with progressive ischemia with very short distance claudication and had developed rest pain in the left foot.  She presents for revascularization.  Of note she had a markedly calcified aorta and for this reason was not felt to be a good candidate for aortofemoral bypass grafting.  FINDINGS:   Brisk Doppler signals in both feet at the completion of the procedure  TECHNIQUE:   The patient was taken to the operating room and received a general anesthetic.  The left chest, infraclavicular region, abdomen and both groins were prepped and draped in usual sterile fashion.  I started by exposing the axillary artery on the left.  An incision was made below the clavicle and the dissection carried down through the pectoralis major muscle down to the clavipectoral fascia.  Several small vein branches were divided and the axillary vein was retracted inferiorly exposing the axillary artery at the level of the thyrocervical trunk.  The pectoralis minor muscle was divided medially with electrocautery.  I was able to get a loop around the vessel and multiple branches were controlled with Vesseloops.  Next using a 2 team approach the right common femoral artery was exposed by Dr. Virl Cagey via an oblique incision.  The dissection was carried down to the common femoral artery which was controlled circumferentially enough that a clamp could be placed proximally and distally.  The deep femoral artery was identified but was fairly low and did not have to be controlled  separately.  On the left side again the an oblique incision was made and the dissection carried down to the common femoral artery.  There was some inflammation around the artery.  I was able to expose the artery [place] place a clamp.  I also controlled the proximal superficial femoral artery and deep femoral artery.  Next a graft tunnel was placed in an inverted U fashion between the 2 groin incisions.  Next a 8 mm externally supported PTFE graft was tunneled between these 2 incisions using the tunneler.  Next an tunnel was created between the axillary incision of the groin incision carefully and then the 8 mm ringed graft tunneled between the 2 incisions.  The patient was heparinized.  Again in order to expedient the procedure the groin anastomoses were sewn simultaneously.  On the right side the common femoral artery was clamped proximally and distally and Dr. Virl Cagey made a longitudinal arteriotomy and sewed the right limb of the femoral-femoral graft into side with continuous 6-0 Prolene suture.  On the left side the common femoral artery was clamped proximally and then the superficial femoral artery and deep femoral arteries controlled.  A longitudinal arteriotomy was made in the common femoral artery and the left limb of the femoral-femoral graft was sewn end to side to the common femoral artery using continuous 6-0 Prolene suture.  Both graft anastomoses were backbled and flushed and the anastomosis completed.  Next on the left side I exposed the very distal aspect of the left limb of the femoral-femoral  graft and opened a longitudinal arteriotomy.  After the rings were removed for the distal aspect of the axillofemoral graft this was slightly spatulated and sewn end-to-side to the femoral-femoral graft using a continuous CV 6 PTFE suture.  There was good hemostasis.  Finally attention was turned to the axillary anastomosis.  The artery was clamped proximally and distally and a longitudinal arteriotomy  was made.  The graft was spatulated and leaving some slight redundancy.  It was sewn into side to the axillary artery using continuous 6-0 Prolene suture.  Prior to completing anastomosis the artery was backbled and flushed appropriately and the anastomosis completed.  Hemostasis was obtained in the wounds and the heparin was partially reversed with protamine.  Hemostatic agents were used as there was some slight seepage through the graft and the Gelfoam and thrombin seem to solve this problem.  Next hemostasis was obtained in the wounds.  On the right side the deep layer was closed with running 2-0 Vicryl.  2 deep layers of 3-0 Vicryl were placed and then a 4-0 Monocryl placed.  Likewise, on the left side the deep layer was closed with running 2-0 Vicryl.  The subcutaneous layer was closed with 3-0 Vicryl and the skin closed with 4-0 Monocryl.  The axillary incision was closed with 3 deep layers of 3-0 Vicryl and the skin closed with 4-0 Monocryl.  Praveena dressings were placed on the groin incisions at the completion of the procedure.   Given the complexity of the case a 2 team approach was used.  Dr. Virl Cagey worked on the right groin exposing the artery and sewing the anastomosis at the same time that I was working on the left groin.  The 6 be dated the procedure.  He was also in instrumental in tunneling the graft which was challenging given her body habitus.  In addition the physician assistant was critically important in assisting in exposing in addition to following suture and closing the wounds.   Given the complexity of the case,  the assistant was necessary in order to expedient the procedure and safely perform the technical aspects of the operation.  The assistant provided traction and countertraction to assist with exposure of the artery proximally and distally.  They assisted with suture ligature of multiple branches.  Their assistance was critical in the performance of both the proximal and  distal anastomosis.These skills, especially following the Prolene suture for the anastomosis, could not have been adequately performed by a scrub tech assistant.   Deitra Mayo, MD, FACS [facility]  DATE OF DICTATION:   10/29/2021

## 2021-10-29 NOTE — Transfer of Care (Signed)
Immediate Anesthesia Transfer of Care Note  Patient: Anahi Belmar Heffern  Procedure(s) Performed: LEFT AXILLOBIFEMORAL BYPASS GRAFT (Bilateral: Groin)  Patient Location: PACU  Anesthesia Type:General  Level of Consciousness: awake  Airway & Oxygen Therapy: Patient Spontanous Breathing  Post-op Assessment: Report given to RN and Post -op Vital signs reviewed and stable  Post vital signs: Reviewed and stable  Last Vitals:  Vitals Value Taken Time  BP 133/67   Temp    Pulse 64 10/29/21 1143  Resp 10 10/29/21 1143  SpO2 95 % 10/29/21 1143  Vitals shown include unvalidated device data.  Last Pain:  Vitals:   10/29/21 0601  TempSrc:   PainSc: 0-No pain      Patients Stated Pain Goal: 0 (81/38/87 1959)  Complications: No notable events documented.

## 2021-10-29 NOTE — Anesthesia Procedure Notes (Signed)
Procedure Name: Intubation Date/Time: 10/29/2021 7:50 AM  Performed by: Lorie Phenix, CRNAPre-anesthesia Checklist: Patient identified, Emergency Drugs available, Suction available and Patient being monitored Patient Re-evaluated:Patient Re-evaluated prior to induction Oxygen Delivery Method: Circle system utilized Preoxygenation: Pre-oxygenation with 100% oxygen Induction Type: IV induction Ventilation: Mask ventilation without difficulty and Oral airway inserted - appropriate to patient size Laryngoscope Size: Mac and 3 Grade View: Grade I Tube type: Oral Tube size: 7.5 mm Number of attempts: 1 Airway Equipment and Method: Stylet Placement Confirmation: ETT inserted through vocal cords under direct vision, positive ETCO2 and breath sounds checked- equal and bilateral Secured at: 22 cm Tube secured with: Tape Dental Injury: Teeth and Oropharynx as per pre-operative assessment

## 2021-10-29 NOTE — Anesthesia Preprocedure Evaluation (Signed)
Anesthesia Evaluation  Patient identified by MRN, date of birth, ID band Patient awake    Reviewed: Allergy & Precautions, NPO status , Patient's Chart, lab work & pertinent test results  Airway Mallampati: II  TM Distance: >3 FB Neck ROM: Full    Dental  (+) Dental Advisory Given   Pulmonary COPD, former smoker,    breath sounds clear to auscultation       Cardiovascular hypertension, Pt. on medications and Pt. on home beta blockers + Peripheral Vascular Disease and + DOE   Rhythm:Regular Rate:Normal     Neuro/Psych  Neuromuscular disease    GI/Hepatic Neg liver ROS, hiatal hernia, GERD  ,  Endo/Other  negative endocrine ROS  Renal/GU negative Renal ROS     Musculoskeletal  (+) Arthritis ,   Abdominal   Peds  Hematology negative hematology ROS (+)   Anesthesia Other Findings   Reproductive/Obstetrics                             Lab Results  Component Value Date   WBC 3.7 (L) 10/22/2021   HGB 11.9 (L) 10/22/2021   HCT 36.5 10/22/2021   MCV 100.0 10/22/2021   PLT 238 10/22/2021   Lab Results  Component Value Date   CREATININE 0.82 10/22/2021   BUN 10 10/22/2021   NA 139 10/22/2021   K 3.5 10/22/2021   CL 107 10/22/2021   CO2 23 10/22/2021    Anesthesia Physical Anesthesia Plan  ASA: 3  Anesthesia Plan: General   Post-op Pain Management: Tylenol PO (pre-op)*   Induction: Intravenous  PONV Risk Score and Plan: 3 and Dexamethasone, Ondansetron and Treatment may vary due to age or medical condition  Airway Management Planned: Oral ETT  Additional Equipment: Arterial line  Intra-op Plan:   Post-operative Plan: Extubation in OR  Informed Consent: I have reviewed the patients History and Physical, chart, labs and discussed the procedure including the risks, benefits and alternatives for the proposed anesthesia with the patient or authorized representative who has  indicated his/her understanding and acceptance.     Dental advisory given  Plan Discussed with: CRNA  Anesthesia Plan Comments:         Anesthesia Quick Evaluation

## 2021-10-29 NOTE — Progress Notes (Addendum)
  Day of Surgery Note    Subjective:  c/o pain   Vitals:   10/29/21 1145 10/29/21 1200  BP: 122/75 120/73  Pulse: 63 61  Resp: 11 11  Temp:    SpO2: 93% 93%    Incisions:   bilateral groins with Prevena vacs with good seal Extremities:  monophasic doppler flow bilateral DP/PT Cardiac:  regular Lungs:  non labored 99% on 2LO2NC    Assessment/Plan:  This is a 66 y.o. female who is s/p  Left axillobifemoral bypass  -pt doing well in recovery with monophasic doppler flow bilateral DP/PT -prevena vacs in bilateral groins with good seal and left clavicular incision is clean and dry -continue with pain control -to 4 east when bed available -continue asa/statin   Leontine Locket, PA-C 10/29/2021 12:17 PM [phone]  I have interviewed the patient and examined the patient. I agree with the findings by the PA.  Her Martin Majestic has been intermittently beeping.  Both Praveena's are connected to one machine so we will have to exchange this for the [facility] device.  Gae Gallop, MD

## 2021-10-29 NOTE — Progress Notes (Signed)
Praveena wound vac exchanged for standard wound vac on praveena setting with good seal.   Clyde Canterbury, RN

## 2021-10-30 ENCOUNTER — Encounter (HOSPITAL_COMMUNITY): Payer: Self-pay | Admitting: Vascular Surgery

## 2021-10-30 LAB — LIPID PANEL
Cholesterol: 117 mg/dL (ref 0–200)
HDL: 54 mg/dL (ref >40)
LDL Cholesterol: 53 mg/dL (ref 0–99)
Total CHOL/HDL Ratio: 2.2 RATIO
Triglycerides: 49 mg/dL (ref <150)
VLDL: 10 mg/dL (ref 0–40)

## 2021-10-30 LAB — CBC
HCT: 31 % — ABNORMAL LOW (ref 36.0–46.0)
Hemoglobin: 10.4 g/dL — ABNORMAL LOW (ref 12.0–15.0)
MCH: 32.4 pg (ref 26.0–34.0)
MCHC: 33.5 g/dL (ref 30.0–36.0)
MCV: 96.6 fL (ref 80.0–100.0)
Platelets: 209 10*3/uL (ref 150–400)
RBC: 3.21 MIL/uL — ABNORMAL LOW (ref 3.87–5.11)
RDW: 13.9 % (ref 11.5–15.5)
WBC: 6.6 10*3/uL (ref 4.0–10.5)
nRBC: 0 % (ref 0.0–0.2)

## 2021-10-30 LAB — BASIC METABOLIC PANEL
Anion gap: 13 (ref 5–15)
BUN: 10 mg/dL (ref 8–23)
CO2: 24 mmol/L (ref 22–32)
Calcium: 9 mg/dL (ref 8.9–10.3)
Chloride: 100 mmol/L (ref 98–111)
Creatinine, Ser: 0.84 mg/dL (ref 0.44–1.00)
GFR, Estimated: >60 mL/min (ref >60)
Glucose, Bld: 114 mg/dL — ABNORMAL HIGH (ref 70–99)
Potassium: 3.7 mmol/L (ref 3.5–5.1)
Sodium: 137 mmol/L (ref 135–145)

## 2021-10-30 MED ORDER — ATORVASTATIN CALCIUM 80 MG PO TABS
80.0000 mg | ORAL_TABLET | Freq: Every day | ORAL | Status: DC
  Administered 2021-10-30 – 2021-11-02 (×4): 80 mg via ORAL
  Filled 2021-10-30 (×4): qty 1

## 2021-10-30 NOTE — Evaluation (Signed)
Occupational Therapy Evaluation Patient Details Name: Brandy Stevenson MRN: [id number] DOB: [birth year] Today's Date: 10/30/2021   History of Present Illness Pt is a 66 y/o female presenting on 8/29 for planned L axillobifemoral bypass due to known aortic occlusion. S/P revascularization 8/29. PMH includes: arthritis, L breast cancer with radiation and surgery, COPD, HTN, PVD, leriche syndrome, depression.   Clinical Impression   PTA patient independent and driving. Admitted for above and presents with problem list below, including impaired balance, pain and decreased activity tolerance. Pt demonstrates ability to complete transfers using RW with min assist, in room mobility with min guard using RW, and requires up to mod assist for ADLs.  She has good support at home, who can assist her as needed. Believe she will benefit from continued OT services acutely and after dc at [facility] level to optimize independence, safety and return to PLOF.      Recommendations for follow up therapy are one component of a multi-disciplinary discharge planning process, led by the attending physician.  Recommendations may be updated based on patient status, additional functional criteria and insurance authorization.   Follow Up Recommendations  Home health OT    Assistance Recommended at Discharge Intermittent Supervision/Assistance  Patient can return home with the following A little help with walking and/or transfers;A lot of help with bathing/dressing/bathroom;Assistance with cooking/housework;Assist for transportation;Help with stairs or ramp for entrance    Functional Status Assessment  Patient has had a recent decline in their functional status and demonstrates the ability to make significant improvements in function in a reasonable and predictable amount of time.  Equipment Recommendations  BSC/3in1    Recommendations for Other Services       Precautions / Restrictions Precautions Precautions:  Other (comment) Precaution Comments: VAC      Mobility Bed Mobility               General bed mobility comments: OOB upon entry in recliner    Transfers Overall transfer level: Needs assistance Equipment used: Rolling walker (2 wheels) Transfers: Sit to/from Stand Sit to Stand: Min assist           General transfer comment: min assist to power up and steady from recliner, cueing for hand placement and safety      Balance Overall balance assessment: Needs assistance Sitting-balance support: No upper extremity supported, Feet supported Sitting balance-Leahy Scale: Good     Standing balance support: Bilateral upper extremity supported, During functional activity, No upper extremity supported Standing balance-Leahy Scale: Fair Standing balance comment: relies on UE support dyamically but able to engage in ADLs without UE support                           ADL either performed or assessed with clinical judgement   ADL Overall ADL's : Needs assistance/impaired     Grooming: Set up;Sitting           Upper Body Dressing : Minimal assistance;Sitting   Lower Body Dressing: Maximal assistance;Sit to/from stand Lower Body Dressing Details (indicate cue type and reason): assist for socks, min assist to stand Toilet Transfer: Minimal assistance;Ambulation;Rolling walker (2 wheels) Toilet Transfer Details (indicate cue type and reason): simulated in room         Functional mobility during ADLs: Minimal assistance;Rolling walker (2 wheels);Cueing for safety       Vision   Vision Assessment?: No apparent visual deficits     Perception     Praxis  Pertinent Vitals/Pain Pain Assessment Pain Assessment: Faces Faces Pain Scale: Hurts little more Pain Location: bilateral groin incisions Pain Descriptors / Indicators: Burning Pain Intervention(s): Limited activity within patient's tolerance, Monitored during session, Repositioned     Hand  Dominance Right   Extremity/Trunk Assessment Upper Extremity Assessment Upper Extremity Assessment: Generalized weakness;LUE deficits/detail LUE Deficits / Details: limited shoulder flexion due to pain from incision   Lower Extremity Assessment Lower Extremity Assessment: Defer to PT evaluation RLE Deficits / Details: Limited by groin pain LLE Deficits / Details: Limited by groin pain       Communication Communication Communication: No difficulties   Cognition Arousal/Alertness: Awake/alert Behavior During Therapy: WFL for tasks assessed/performed Overall Cognitive Status: Within Functional Limits for tasks assessed                                       General Comments  VSS on RA    Exercises     Shoulder Instructions      Home Living Family/patient expects to be discharged to:: Private residence Living Arrangements: Spouse/significant other Available Help at Discharge: Family;Available 24 hours/day Type of Home: House Home Access: Stairs to enter [employer] of Steps: 3 Entrance Stairs-Rails: None Home Layout: One level     Bathroom Shower/Tub: Teacher, early years/pre: Standard     Home Equipment: Cane - single point;Toilet riser;Shower seat          Prior Functioning/Environment Prior Level of Function : Independent/Modified Independent;Driving             Mobility Comments: Used straight cane at times ADLs Comments: independent ADLs, light IADls, driving        OT Problem List: Decreased strength;Decreased activity tolerance;Impaired balance (sitting and/or standing);Decreased knowledge of use of DME or AE;Decreased knowledge of precautions;Pain      OT Treatment/Interventions: Self-care/ADL training;Therapeutic exercise;DME and/or AE instruction;Therapeutic activities;Balance training;Patient/family education;Energy conservation    OT Goals(Current goals can be found in the care plan section) Acute Rehab OT  Goals Patient Stated Goal: home OT Goal Formulation: With patient Time For Goal Achievement: 11/13/21 Potential to Achieve Goals: Good  OT Frequency: Min 2X/week    Co-evaluation              AM-PAC OT "6 Clicks" Daily Activity     Outcome Measure Help from another person eating meals?: None Help from another person taking care of personal grooming?: A Little Help from another person toileting, which includes using toliet, bedpan, or urinal?: A Little Help from another person bathing (including washing, rinsing, drying)?: A Lot Help from another person to put on and taking off regular upper body clothing?: A Little Help from another person to put on and taking off regular lower body clothing?: A Lot 6 Click Score: 17   End of Session Equipment Utilized During Treatment: Rolling walker (2 wheels) Nurse Communication: Mobility status;Other (comment);Precautions (make up bed, pt wanting to get back to bed)  Activity Tolerance: Patient tolerated treatment well Patient left: in chair;with call bell/phone within reach;with family/visitor present  OT Visit Diagnosis: Other abnormalities of gait and mobility (R26.89);Pain Pain - part of body:  (groin)                Time: 2536-6440 OT Time Calculation (min): 20 min Charges:  OT General Charges $OT Visit: 1 Visit OT Evaluation $OT Eval Moderate Complexity: 1 Mod  Jolynne Spurgin B, OT  Acute Rehabilitation Services Office [phone]   Delight Stare 10/30/2021, 1:47 PM

## 2021-10-30 NOTE — Progress Notes (Signed)
Mobility Specialist Progress Note    10/30/21 1718  Mobility  Activity Ambulated with assistance in hallway  Level of Assistance Moderate assist, patient does 50-74%  Assistive Device Four wheel walker  Distance Ambulated (ft) 110 ft  Activity Response Tolerated well  $Mobility charge 1 Mobility   Pre-Mobility: 70 HR, 107/66 BP Post-Mobility: 76 HR, 127/69 BP  Pt received in bed and agreeable. BP 132/86 sitting EOB. C/o leg pain. Returned to bed with call bell in reach.    Hildred Alamin Mobility Specialist

## 2021-10-30 NOTE — Progress Notes (Signed)
   VASCULAR SURGERY ASSESSMENT & PLAN:   POD 1 LEFT AXILLOBIFEMORAL BYPASS: Good Doppler signals in both feet.  Palpable dorsalis pedis pulses bilaterally.  The Praveena dressings have a good seal on both groins.  Her infraclavicular incision on the left looks fine  DVT PROPHYLAXIS: She is on subcu heparin.  VASCULAR QUALITY INITIATIVE: She is on aspirin and is on a statin.  DISPOSITION: Begin to mobilize.  Home once she is mobile and pain controlled on po pain meds.   SUBJECTIVE:   No complaints this morning.  PHYSICAL EXAM:   Vitals:   10/29/21 2145 10/29/21 2300 10/30/21 0005 10/30/21 0451  BP:  127/78 131/73 129/72  Pulse: 66 69 65 70  Resp: '17 15 13 13  '$ Temp:   98.4 F (36.9 C) 98.3 F (36.8 C)  TempSrc:   Oral Oral  SpO2: 97% 96% 98% 98%  Weight:      Height:       Her Praveena dressings have a good seal on both groins. Her infraclavicular incision on the left looks fine. She has palpable dorsalis pedis pulses. She has brisk Doppler signals in both feet. No significant bruising along the tunnel site on the left chest  LABS:   Lab Results  Component Value Date   WBC 6.6 10/30/2021   HGB 10.4 (L) 10/30/2021   HCT 31.0 (L) 10/30/2021   MCV 96.6 10/30/2021   PLT 209 10/30/2021   Lab Results  Component Value Date   CREATININE 0.84 10/30/2021   Lab Results  Component Value Date   INR 1.0 10/22/2021    PROBLEM LIST:    Principal Problem:   Aortoiliac occlusive disease (HCC)   CURRENT MEDS:    sodium chloride   Intravenous Once   amLODipine  10 mg Oral QHS   aspirin EC  81 mg Oral Daily   atorvastatin  40 mg Oral QHS   carvedilol  6.25 mg Oral BID   cilostazol  50 mg Oral Daily   docusate sodium  100 mg Oral Daily   fluticasone furoate-vilanterol  1 puff Inhalation Daily   gabapentin  300 mg Oral QHS   heparin  5,000 Units Subcutaneous Q8H   hydrALAZINE  25 mg Oral BID   lisinopril  40 mg Oral Daily   oxyCODONE-acetaminophen        oxyCODONE-acetaminophen       pantoprazole  40 mg Oral Daily   potassium chloride  20 mEq Oral Daily   spironolactone  25 mg Oral Daily    Deitra Mayo Office: [phone] 10/30/2021

## 2021-10-30 NOTE — Evaluation (Signed)
Physical Therapy Evaluation Patient Details Name: Brandy Stevenson MRN: [id number] DOB: 08-16-1955 Today's Date: 10/30/2021  History of Present Illness  Pt is a 66 y/o female presenting on 8/29 for planned L axillobifemoral bypass due to known aortic occlusion. S/P revascularization 8/29. PMH includes: arthritis, L breast cancer with radiation and surgery, COPD, HTN, PVD, leriche syndrome, depression.  Clinical Impression  Pt admitted with above diagnosis and presents to PT with functional limitations due to deficits listed below (See PT problem list). Pt needs skilled PT to maximize independence and safety to allow discharge to home with husband.         Recommendations for follow up therapy are one component of a multi-disciplinary discharge planning process, led by the attending physician.  Recommendations may be updated based on patient status, additional functional criteria and insurance authorization.  Follow Up Recommendations Home health PT      Assistance Recommended at Discharge Intermittent Supervision/Assistance  Patient can return home with the following  A little help with walking and/or transfers;Help with stairs or ramp for entrance;A little help with bathing/dressing/bathroom;Assistance with cooking/housework    Equipment Recommendations Rollator (4 wheels)  Recommendations for Other Services       Functional Status Assessment Patient has had a recent decline in their functional status and demonstrates the ability to make significant improvements in function in a reasonable and predictable amount of time.     Precautions / Restrictions Precautions Precautions: Other (comment) Precaution Comments: VAC      Mobility  Bed Mobility Overal bed mobility: Needs Assistance Bed Mobility: Supine to Sit, Sit to Supine     Supine to sit: Mod assist     General bed mobility comments: Assist to bring legs off of bed, elevate trunk into sitting, and bring hips to  EOB.    Transfers Overall transfer level: Needs assistance Equipment used: Rollator (4 wheels) Transfers: Sit to/from Stand Sit to Stand: Min assist, Min guard           General transfer comment: Assist to bring hips up on initial stand from bed. Min guard for safety standing from 3-in-1 over commode    Ambulation/Gait Ambulation/Gait assistance: Min guard Gait Distance (Feet): 120 Feet Assistive device: Rollator (4 wheels) Gait Pattern/deviations: Decreased step length - right, Decreased step length - left, Step-through pattern Gait velocity: very, very slow Gait velocity interpretation: <1.31 ft/sec, indicative of household ambulator   General Gait Details: Assist for Scientist, research (medical)    Modified Rankin (Stroke Patients Only)       Balance Overall balance assessment: Mild deficits observed, not formally tested                                           Pertinent Vitals/Pain Pain Assessment Pain Assessment: Faces Faces Pain Scale: Hurts little more Pain Location: bilateral groin incisions Pain Descriptors / Indicators: Burning Pain Intervention(s): Limited activity within patient's tolerance, Monitored during session, Repositioned    Home Living Family/patient expects to be discharged to:: Private residence Living Arrangements: Spouse/significant other Available Help at Discharge: Family;Available 24 hours/day Type of Home: House Home Access: Stairs to enter Entrance Stairs-Rails: None Entrance Stairs-Number of Steps: 3   Home Layout: One level Home Equipment: Cane - single point;Toilet riser      Prior Function Prior Level of Function :  Independent/Modified Independent;Driving             Mobility Comments: Used straight cane at times       Hand Dominance        Extremity/Trunk Assessment   Upper Extremity Assessment Upper Extremity Assessment: Defer to OT evaluation    Lower  Extremity Assessment Lower Extremity Assessment: Generalized weakness;RLE deficits/detail;LLE deficits/detail RLE Deficits / Details: Limited by groin pain LLE Deficits / Details: Limited by groin pain       Communication   Communication: No difficulties  Cognition Arousal/Alertness: Awake/alert Behavior During Therapy: WFL for tasks assessed/performed Overall Cognitive Status: Within Functional Limits for tasks assessed                                          General Comments General comments (skin integrity, edema, etc.): VSS on RA    Exercises     Assessment/Plan    PT Assessment Patient needs continued PT services  PT Problem List Decreased strength;Decreased activity tolerance;Decreased mobility;Pain;Obesity       PT Treatment Interventions DME instruction;Gait training;Stair training;Functional mobility training;Therapeutic activities;Therapeutic exercise;Patient/family education    PT Goals (Current goals can be found in the Care Plan section)  Acute Rehab PT Goals Patient Stated Goal: return home PT Goal Formulation: With patient Time For Goal Achievement: 11/13/21 Potential to Achieve Goals: Good    Frequency Min 3X/week     Co-evaluation               AM-PAC PT "6 Clicks" Mobility  Outcome Measure Help needed turning from your back to your side while in a flat bed without using bedrails?: A Lot Help needed moving from lying on your back to sitting on the side of a flat bed without using bedrails?: A Lot Help needed moving to and from a bed to a chair (including a wheelchair)?: A Little Help needed standing up from a chair using your arms (e.g., wheelchair or bedside chair)?: A Little Help needed to walk in hospital room?: A Little Help needed climbing 3-5 steps with a railing? : A Lot 6 Click Score: 15    End of Session Equipment Utilized During Treatment: Gait belt Activity Tolerance: Patient tolerated treatment well Patient  left: in chair;with call bell/phone within reach Nurse Communication: Mobility status PT Visit Diagnosis: Other abnormalities of gait and mobility (R26.89);Pain Pain - Right/Left:  (bilateral) Pain - part of body:  (groin)    Time: 1610-9604 PT Time Calculation (min) (ACUTE ONLY): 34 min   Charges:   PT Evaluation $PT Eval Moderate Complexity: 1 Mod PT Treatments $Gait Training: 8-22 mins        [place] Office [place] 10/30/2021, 1:15 PM

## 2021-10-30 NOTE — Plan of Care (Signed)

## 2021-10-30 NOTE — Progress Notes (Signed)
Pt requested pain meds. BP 93/52. Admin PRN 553m NS bolus. BP 94/56 following. Admin second PRN 5098mbolus. BP 127/69. Pt with call light in reach and no complaints.  ArRaelyn NumberRN

## 2021-10-30 NOTE — Progress Notes (Signed)
PHARMACIST LIPID MONITORING   Brandy Stevenson is a 66 y.o. female admitted on 10/29/2021 with L axillobifemoral bypass.  Pharmacy has been consulted to optimize lipid-lowering therapy with the indication of secondary prevention for clinical ASCVD.  Recent Labs:  Lipid Panel (last 6 months):   Lab Results  Component Value Date   CHOL 117 10/30/2021   TRIG 49 10/30/2021   HDL 54 10/30/2021   CHOLHDL 2.2 10/30/2021   VLDL 10 10/30/2021   LDLCALC 53 10/30/2021    Hepatic function panel (last 6 months):   Lab Results  Component Value Date   AST 20 10/22/2021   ALT 25 10/22/2021   ALKPHOS 66 10/22/2021   BILITOT 0.4 10/22/2021    SCr (since admission):   Serum creatinine: 0.84 mg/dL 10/30/21 0530 Estimated creatinine clearance: 71.3 mL/min  Current therapy and lipid therapy tolerance Current lipid-lowering therapy: atorvastatin 40 mg  Previous lipid-lowering therapies (if applicable): atorvastatin 40 mg  Documented or reported allergies or intolerances to lipid-lowering therapies (if applicable): None  Assessment:   Patient agrees with changes to lipid-lowering therapy  Plan:    1.Statin intensity (high intensity recommended for all patients regardless of the LDL):  Add or increase statin to high intensity.  2.Add ezetimibe (if any one of the following):   Not indicated at this time.  3.Refer to lipid clinic:   No  4.Follow-up with:  Primary care provider - [name], MD  5.Follow-up labs after discharge:  Changes in lipid therapy were made. Check a lipid panel in 8-12 weeks then annually.      Antonietta Jewel, PharmD, BCCCP Clinical Pharmacist  Phone: [phone] 10/30/2021 11:11 AM  Please check AMION for all [place] phone numbers After 10:00 PM, call [place] [phone]

## 2021-10-31 ENCOUNTER — Other Ambulatory Visit: Payer: Self-pay

## 2021-10-31 NOTE — Progress Notes (Signed)
After receiving pain medication. Pt bp decreased a few times overnight, however upon respositioning and recheck pt bp would increase. Pt is asymptomatic, alert and oriented without any pain at this time.

## 2021-10-31 NOTE — Progress Notes (Signed)
Mobility Specialist - Progress Note    10/31/21 1424  Mobility  Activity Contraindicated/medical hold   Pt was received EOB. Pt c/o bleeding from incision and being weak from sitting in the chair for 5hrs. Pt was returned in bed with all needs met and RN notified.   Larey Seat

## 2021-10-31 NOTE — Progress Notes (Signed)
Physical Therapy Treatment Patient Details Name: Brandy Stevenson MRN: [id number] DOB: 1956-02-28 Today's Date: 10/31/2021   History of Present Illness Pt is a 66 y/o female presenting on 8/29 for planned L axillobifemoral bypass due to known aortic occlusion. S/P revascularization 8/29. PMH includes: arthritis, L breast cancer with radiation and surgery, COPD, HTN, PVD, leriche syndrome, depression.    PT Comments    Pt received sitting EOB on arrival and agreeable to session with focus on gait training for increased activity tolerance and LE strength. Pt with very slow but steady ambulation with rollator and min guard for safety with distance limited to pt stated tolerance, secondary to fatigue. Pt with fair tolerance for LE therex for increased ROM and strength. Education pt on benefits and importance of OOB mobility, activity recommendations and exercise to complete throughout day, with pt verbalizing understanding. Pt continues to benefit from skilled PT services to progress toward functional mobility goals.    Recommendations for follow up therapy are one component of a multi-disciplinary discharge planning process, led by the attending physician.  Recommendations may be updated based on patient status, additional functional criteria and insurance authorization.  Follow Up Recommendations  Home health PT     Assistance Recommended at Discharge Intermittent Supervision/Assistance  Patient can return home with the following A little help with walking and/or transfers;Help with stairs or ramp for entrance;A little help with bathing/dressing/bathroom;Assistance with cooking/housework   Equipment Recommendations  Rollator (4 wheels)    Recommendations for Other Services       Precautions / Restrictions Precautions Precautions: Other (comment) Precaution Comments: VAC     Mobility  Bed Mobility Overal bed mobility: Needs Assistance             General bed mobility  comments: pt seated EOB on arival    Transfers Overall transfer level: Needs assistance Equipment used: Rolling walker (2 wheels) Transfers: Sit to/from Stand Sit to Stand: Min assist           General transfer comment: min assist to power up and steady, cueing for hand placement and safety    Ambulation/Gait Ambulation/Gait assistance: Min guard Gait Distance (Feet): 130 Feet Assistive device: Rollator (4 wheels) Gait Pattern/deviations: Decreased step length - right, Decreased step length - left, Step-through pattern Gait velocity: very, very slow     General Gait Details: Assist for safety   Stairs             Wheelchair Mobility    Modified Rankin (Stroke Patients Only)       Balance Overall balance assessment: Needs assistance Sitting-balance support: No upper extremity supported, Feet supported Sitting balance-Leahy Scale: Good     Standing balance support: Bilateral upper extremity supported, During functional activity, No upper extremity supported Standing balance-Leahy Scale: Fair Standing balance comment: relies on UE support dyamically but able to engage in ADLs without UE support                            Cognition Arousal/Alertness: Awake/alert Behavior During Therapy: WFL for tasks assessed/performed Overall Cognitive Status: Within Functional Limits for tasks assessed                                          Exercises General Exercises - Lower Extremity Ankle Circles/Pumps: AROM, Right, Left, 10 reps Long Arc Quad: AROM, Right, Left,  10 reps, Seated Hip ABduction/ADduction: AROM, Right, Left, 10 reps, Seated (long sitting) Hip Flexion/Marching: AROM, Right, Left, 20 reps, Seated    General Comments General comments (skin integrity, edema, etc.): VSS on RA, encouraged continued mobility throughout day      Pertinent Vitals/Pain Pain Assessment Pain Assessment: Faces Faces Pain Scale: Hurts a little  bit Pain Location: bilateral groin incisions Pain Descriptors / Indicators: Grimacing Pain Intervention(s): Monitored during session, Limited activity within patient's tolerance, Repositioned    Home Living                          Prior Function            PT Goals (current goals can now be found in the care plan section) Acute Rehab PT Goals Patient Stated Goal: return home PT Goal Formulation: With patient Time For Goal Achievement: 11/13/21    Frequency    Min 3X/week      PT Plan      Co-evaluation              AM-PAC PT "6 Clicks" Mobility   Outcome Measure  Help needed turning from your back to your side while in a flat bed without using bedrails?: A Lot Help needed moving from lying on your back to sitting on the side of a flat bed without using bedrails?: A Lot Help needed moving to and from a bed to a chair (including a wheelchair)?: A Little Help needed standing up from a chair using your arms (e.g., wheelchair or bedside chair)?: A Little Help needed to walk in hospital room?: A Little Help needed climbing 3-5 steps with a railing? : A Lot 6 Click Score: 15    End of Session Equipment Utilized During Treatment: Gait belt Activity Tolerance: Patient tolerated treatment well Patient left: in chair;with call bell/phone within reach Nurse Communication: Mobility status PT Visit Diagnosis: Other abnormalities of gait and mobility (R26.89);Pain Pain - Right/Left:  (bilateral) Pain - part of body:  (groin)     Time: 2725-3664 PT Time Calculation (min) (ACUTE ONLY): 29 min  Charges:  $Gait Training: 8-22 mins $Therapeutic Exercise: 8-22 mins                     Chad Donoghue R. PTA Acute Rehabilitation Services Office: [place] 10/31/2021, 10:06 AM

## 2021-10-31 NOTE — Progress Notes (Addendum)
  Progress Note    10/31/2021 7:54 AM 2 Days Post-Op  Subjective:  Patient says she feels good, endorses some pain in her left groin and left clavicular incision   Vitals:   10/31/21 0449 10/31/21 0600  BP:  103/62  Pulse:  70  Resp:  (!) 21  Temp: 99.3 F (37.4 C)   SpO2: 95% 98%    Physical Exam: Lungs:  nonlabored Incisions:  L subclavicular incision c/d/l. Bilateral groins with prevena, with good seal. Extremities:  Brisk DP and PT doppler signals bilaterally Abdomen:  soft, nontender  CBC    Component Value Date/Time   WBC 6.6 10/30/2021 0530   RBC 3.21 (L) 10/30/2021 0530   HGB 10.4 (L) 10/30/2021 0530   HGB 12.6 08/20/2021 0929   HGB 13.8 06/12/2008 0939   HCT 31.0 (L) 10/30/2021 0530   HCT 37.0 08/20/2021 0929   HCT 40.2 06/12/2008 0939   PLT 209 10/30/2021 0530   PLT 237 08/20/2021 0929   MCV 96.6 10/30/2021 0530   MCV 95 08/20/2021 0929   MCV 90.1 06/12/2008 0939   MCH 32.4 10/30/2021 0530   MCHC 33.5 10/30/2021 0530   RDW 13.9 10/30/2021 0530   RDW 12.9 08/20/2021 0929   RDW 13.9 06/12/2008 0939   LYMPHSABS 1.8 08/07/2021 0940   LYMPHSABS 0.4 (L) 03/10/2017 1335   LYMPHSABS 1.5 06/12/2008 0939   MONOABS 0.5 08/07/2021 0940   MONOABS 0.6 06/12/2008 0939   EOSABS 0.1 08/07/2021 0940   EOSABS 0.1 03/10/2017 1335   BASOSABS 0.1 08/07/2021 0940   BASOSABS 0.0 03/10/2017 1335   BASOSABS 0.0 06/12/2008 0939    BMET    Component Value Date/Time   NA 137 10/30/2021 0530   NA 141 08/20/2021 0929   K 3.7 10/30/2021 0530   CL 100 10/30/2021 0530   CO2 24 10/30/2021 0530   GLUCOSE 114 (H) 10/30/2021 0530   BUN 10 10/30/2021 0530   BUN 12 08/20/2021 0929   CREATININE 0.84 10/30/2021 0530   CREATININE 0.77 04/17/2016 1104   CALCIUM 9.0 10/30/2021 0530   GFRNONAA >60 10/30/2021 0530   GFRNONAA 85 04/17/2016 1104   GFRAA 93 04/28/2019 1222   GFRAA >89 04/17/2016 1104    INR    Component Value Date/Time   INR 1.0 10/22/2021 0924      Intake/Output Summary (Last 24 hours) at 10/31/2021 0754 Last data filed at 10/31/2021 0102 Gross per 24 hour  Intake 240 ml  Output 1 ml  Net 239 ml   Assessment/Plan:  66 y.o. female is 2 days post op, s/p: left axillofemoral bypass  -Pain is currently well controlled on oral pain meds -Patient is ambulating more, has walked around her bed a couple times -Incisions are c/d/l. Bilateral groins with prevena vac with good seal -Brisk doppler signals DP and PT bilaterally -DVT prophylaxis:  subcutaneous heparin  Vicente Serene, PA-C [facility] [phone] 10/31/2021 7:54 AM  I have interviewed the patient and examined the patient. I agree with the findings by the PA.  It sounds like she may need another day to get mobile.  In addition her blood pressure has been soft.  Her hemoglobin yesterday was 10.  DC IV fluid.  Gae Gallop, MD

## 2021-11-01 ENCOUNTER — Other Ambulatory Visit: Payer: Self-pay

## 2021-11-01 LAB — CBC
HCT: 29.2 % — ABNORMAL LOW (ref 36.0–46.0)
Hemoglobin: 9.6 g/dL — ABNORMAL LOW (ref 12.0–15.0)
MCH: 32.7 pg (ref 26.0–34.0)
MCHC: 32.9 g/dL (ref 30.0–36.0)
MCV: 99.3 fL (ref 80.0–100.0)
Platelets: 160 10*3/uL (ref 150–400)
RBC: 2.94 MIL/uL — ABNORMAL LOW (ref 3.87–5.11)
RDW: 13.8 % (ref 11.5–15.5)
WBC: 8.2 10*3/uL (ref 4.0–10.5)
nRBC: 0 % (ref 0.0–0.2)

## 2021-11-01 NOTE — Progress Notes (Signed)
Occupational Therapy Treatment Patient Details Name: Brandy Stevenson MRN: [id number] DOB: [birth year] Today's Date: 11/01/2021   History of present illness Pt is a 65 y/o female presenting on 8/29 for planned L axillobifemoral bypass due to known aortic occlusion. S/P revascularization 8/29. PMH includes: arthritis, L breast cancer with radiation and surgery, COPD, HTN, PVD, leriche syndrome, depression.   OT comments  Pt. Seen for skilled OT treatment session. Pt. Able to complete bed mobility, LB dressing task, and in room ambulation to/from b.room for toileting.  Min guard a with some cues for sequencing.  Pt. Very motivated and eager to participate and progress.     Recommendations for follow up therapy are one component of a multi-disciplinary discharge planning process, led by the attending physician.  Recommendations may be updated based on patient status, additional functional criteria and insurance authorization.    Follow Up Recommendations  Home health OT    Assistance Recommended at Discharge Intermittent Supervision/Assistance  Patient can return home with the following  A little help with walking and/or transfers;A lot of help with bathing/dressing/bathroom;Assistance with cooking/housework;Assist for transportation;Help with stairs or ramp for entrance   Equipment Recommendations  BSC/3in1    Recommendations for Other Services      Precautions / Restrictions Precautions Precautions: Other (comment) Precaution Comments: VAC       Mobility Bed Mobility Overal bed mobility: Needs Assistance Bed Mobility: Supine to Sit, Sit to Supine     Supine to sit: Supervision Sit to supine: Supervision        Transfers Overall transfer level: Needs assistance Equipment used: Rolling walker (2 wheels) Transfers: Sit to/from Stand, Bed to chair/wheelchair/BSC Sit to Stand: Min assist     Step pivot transfers: Min guard     General transfer comment:  encouragement not to rock but try and push from the bed to come into standing, was able to with cues for sequencing     Balance                                           ADL either performed or assessed with clinical judgement   ADL Overall ADL's : Needs assistance/impaired                     Lower Body Dressing: Min guard;Sitting/lateral leans Lower Body Dressing Details (indicate cue type and reason): able to turn each way in the bed and and pull each LE into the bed to don/doff socks Toilet Transfer: Min guard;Ambulation;Rolling walker (2 wheels);BSC/3in1;Regular Glass blower/designer Details (indicate cue type and reason): 3n1 over the commode, has also been using 3n1 beside the bed-education on uses of 3n1 for various locations Toileting- Clothing Manipulation and Hygiene: Set up;Sitting/lateral lean       Functional mobility during ADLs: Min guard;Rolling walker (2 wheels) General ADL Comments: education on the uses of 3n1 pt. agreeable to over commode during the day but also benefits of bsc near bed at night    Extremity/Trunk Assessment              Vision       Perception     Praxis      Cognition Arousal/Alertness: Awake/alert Behavior During Therapy: WFL for tasks assessed/performed Overall Cognitive Status: Within Functional Limits for tasks assessed  Exercises      Shoulder Instructions       General Comments VSS on RA    Pertinent Vitals/ Pain       Pain Assessment Pain Assessment: No/denies pain Pain Intervention(s): Premedicated before session  Home Living                                          Prior Functioning/Environment              Frequency  Min 2X/week        Progress Toward Goals  OT Goals(current goals can now be found in the care plan section)  Progress towards OT goals: Progressing toward goals     Plan  Discharge plan remains appropriate    Co-evaluation                 AM-PAC OT "6 Clicks" Daily Activity     Outcome Measure   Help from another person eating meals?: None Help from another person taking care of personal grooming?: A Little Help from another person toileting, which includes using toliet, bedpan, or urinal?: A Little Help from another person bathing (including washing, rinsing, drying)?: A Lot Help from another person to put on and taking off regular upper body clothing?: A Little Help from another person to put on and taking off regular lower body clothing?: A Lot 6 Click Score: 17    End of Session Equipment Utilized During Treatment: Rolling walker (2 wheels);Gait belt  OT Visit Diagnosis: Other abnormalities of gait and mobility (R26.89);Pain   Activity Tolerance Patient tolerated treatment well   Patient Left in bed;with call bell/phone within reach   Nurse Communication          Time: 1030-1044 OT Time Calculation (min): 14 min  Charges: OT General Charges $OT Visit: 1 Visit OT Treatments $Self Care/Home Management : 8-22 mins  Sonia Baller, COTA/L Acute Rehabilitation [phone]   Tanya Nones 11/01/2021, 11:53 AM

## 2021-11-01 NOTE — Progress Notes (Addendum)
   VASCULAR SURGERY ASSESSMENT & PLAN:   POD 3 LEFT AXILLOBIFEMORAL BYPASS: Good Doppler signals in both feet.  The Praveena dressings have a good seal on both groins.  Her infraclavicular incision on the left looks fine   DVT PROPHYLAXIS: She is on subcu heparin.   VASCULAR QUALITY INITIATIVE: She is on aspirin and is on a statin.   DISPOSITION: The patient is still requiring moderate assistance.  I think she needs another day in the hospital.  We will also consult physical therapy to get their input on the need for home health physical therapy.   ADDENDUM: Her blood pressure has been a little bit on the low side although she is asymptomatic.  I have stopped her p.o. Apresoline.  Home health has been consulted for home health PT.  I anticipate that she will be ready for discharge tomorrow.  Gae Gallop, MD 4:10 PM   SUBJECTIVE:   No specific complaints this morning.  PHYSICAL EXAM:   Vitals:   10/31/21 1717 10/31/21 2005 11/01/21 0006 11/01/21 0500  BP: (!) 94/55 124/76 130/75 (P) 110/64  Pulse: 73 78 80 (P) 71  Resp: '16 20 19 '$ (!) (P) 22  Temp: 100.1 F (37.8 C) 99.8 F (37.7 C) 99.5 F (37.5 C) (P) 99.7 F (37.6 C)  TempSrc: Oral Oral Oral (P) Oral  SpO2: 97% 99% 95% (P) 97%  Weight:      Height:       Her Praveena's have a good seal. Her infraclavicular incision looks fine. Both feet are hyperemic with brisk Doppler signals in the dorsalis pedis and posterior tibial positions bilaterally.   LABS:   Lab Results  Component Value Date   WBC 6.6 10/30/2021   HGB 10.4 (L) 10/30/2021   HCT 31.0 (L) 10/30/2021   MCV 96.6 10/30/2021   PLT 209 10/30/2021   Lab Results  Component Value Date   CREATININE 0.84 10/30/2021   Lab Results  Component Value Date   INR 1.0 10/22/2021    PROBLEM LIST:    Principal Problem:   Aortoiliac occlusive disease (HCC)   CURRENT MEDS:    sodium chloride   Intravenous Once   amLODipine  10 mg Oral QHS   aspirin EC  81  mg Oral Daily   atorvastatin  80 mg Oral QHS   carvedilol  6.25 mg Oral BID   cilostazol  50 mg Oral Daily   docusate sodium  100 mg Oral Daily   fluticasone furoate-vilanterol  1 puff Inhalation Daily   gabapentin  300 mg Oral QHS   heparin  5,000 Units Subcutaneous Q8H   hydrALAZINE  25 mg Oral BID   lisinopril  40 mg Oral Daily   pantoprazole  40 mg Oral Daily   potassium chloride  20 mEq Oral Daily   spironolactone  25 mg Oral Daily    Deitra Mayo Office: [phone] 11/01/2021

## 2021-11-01 NOTE — Progress Notes (Addendum)
Morning BP meds held, in order to consult with vascular prior to administration because of pt's hypotension.   Initially received orders from Vascular PA to hold amlodipine only, which is ordered qHS.  Called back for clarification prior to administering meds when 1100 pressure 91/55.  Per Dr. Scot Dock also hold lisinopril and collect CBC.

## 2021-11-01 NOTE — TOC Initial Note (Signed)
Transition of Care (TOC) - Initial/Assessment Note  Marvetta Gibbons RN, BSN Transitions of Care Unit 4E- RN Case Manager See Treatment Team for direct phone #    Patient Details  Name: Brandy Stevenson MRN: [id number] Date of Birth: 02/19/1956  Transition of Care [facility]) CM/SW Contact:    Dawayne Patricia, RN Phone Number: 11/01/2021, 2:57 PM  Clinical Narrative:                 Pt s/p axillofemoral bypass. Orders for HHPT and DME- rollator have been placed.  CM spoke with pt at bedside- discussed [place] and DME needs. Pt agreeable to use in house provider for DME need- rollator delivered to room. Pt has elevated toilet seat, wants to think about BSC.   List provided for [facility] choice- Per CMS guidelines from medicare.gov website with star ratings (copy placed in shadow chart) - after review pt states she does not have a preference- discussed Vascular preferred agency that they have [place] protocols with- [facility] as well was others that sometimes take her insurance- Pt voiced she is agreeable to trying any HH if they will accept her insurance.  Pt to go home with Prevena wound VACs to groins  Address, phone # and PCP all confirmed, pt reports family to transport home.   Call made to [facility] with [facility] to check on availability for [facility] services- referral has been accepted for HPT  Expected Discharge Plan: [place] Barriers to Discharge: Continued Medical Work up   Patient Goals and CMS Choice Patient states their goals for this hospitalization and ongoing recovery are:: return home CMS Medicare.gov Compare Post Acute Care list provided to:: Patient Choice offered to / list presented to : Patient  Expected Discharge Plan and Services Expected Discharge Plan: [place]   Discharge Planning Services: CM Consult Post Acute Care Choice: Home Health, Durable Medical Equipment Living arrangements for the past 2 months: Single Family Home                  DME Arranged: Walker rolling with seat DME Agency: [employer] Date DME Agency Contacted: 11/01/21 Time DME Agency Contacted: 1220 Representative spoke with at DME Agency: Jodell [facility] HH Arranged: PT [place]: [place] Date [place]: 11/01/21 Time [place]: 37 Representative spoke with at [place]: [place] Arrangements/Services Living arrangements for the past 2 months: [place] with:: Spouse Patient language and need for interpreter reviewed:: Yes Do you feel safe going back to the place where you live?: Yes      Need for Family Participation in Patient Care: Yes (Comment) Care giver support system in place?: Yes (comment) Current home services: DME (elevated toilet) Criminal Activity/Legal Involvement Pertinent to Current Situation/Hospitalization: No - Comment as needed  Activities of Daily Living Home Assistive Devices/Equipment: Eyeglasses, Blood pressure cuff, Cane (specify quad or straight), Dentures (specify type), Grab bars in shower, Scales ADL Screening (condition at time of admission) Patient's cognitive ability adequate to safely complete daily activities?: Yes Is the patient deaf or have difficulty hearing?: No Does the patient have difficulty seeing, even when wearing glasses/contacts?: No Does the patient have difficulty concentrating, remembering, or making decisions?: No Patient able to express need for assistance with ADLs?: Yes Does the patient have difficulty dressing or bathing?: No Independently performs ADLs?: Yes (appropriate for developmental age) Does the patient have difficulty walking or climbing stairs?: Yes Weakness of Legs: Both Weakness of  Arms/Hands: Both  Permission Sought/Granted Permission sought to share information with : Facility Art therapist granted to share information with : Yes, Verbal Permission Granted     Permission granted to share info w AGENCY:  HH/DME        Emotional Assessment Appearance:: Appears stated age Attitude/Demeanor/Rapport: Engaged Affect (typically observed): Accepting, Appropriate, Pleasant Orientation: : Oriented to Self, Oriented to Place, Oriented to  Time, Oriented to Situation Alcohol / Substance Use: Not Applicable Psych Involvement: No (comment)  Admission diagnosis:  Aortoiliac occlusive disease ([place]) [I74.09] Patient Active Problem List   Diagnosis Date Noted   Prediabetes 06/15/2021   Influenza vaccine refused 02/03/2020   Resistant hypertension 01/20/2020   Tubular adenoma of colon 04/27/2019   Cervical radiculopathy 10/01/2018   Hypertensive retinopathy 09/08/2018   Seasonal allergies 06/29/2018   Aortoiliac occlusive disease ([place]) 11/04/2017   DOE (dyspnea on exertion) 11/03/2017   Chronic chest wall pain 11/03/2017   Ichthyosis 10/06/2017   Former smoker 04/07/2017   Primary osteoarthritis of right shoulder 04/07/2017   Chronic midline low back pain without sciatica 04/07/2017   Hiatal hernia 05/01/2016   DJD (degenerative joint disease) of cervical spine 04/17/2016   History of breast cancer 02/18/2016   Foot callus 02/18/2016   Anxiety and depression 05/22/2015   Insomnia 03/15/2015   Unspecified vitamin D deficiency 04/08/2013   Hyperlipidemia with target LDL less than 70 04/08/2013   Atherosclerotic PVD with intermittent claudication ([place]) 03/22/2013   Accelerated hypertension 02/23/2013   GERD (gastroesophageal reflux disease) 02/23/2013   COPD (chronic obstructive pulmonary disease) ([place]) 02/23/2013   PCP:  [name], MD Pharmacy:   [place] [facility] [address] [place] [address] Phone: [phone] Fax: [phone]  [facility] ([facility]) - [place] [place] [address] Phone: [phone] Fax:  [phone]  [facility] [phone] [place], [place] - [address] [place] [address] Phone: [phone] Fax: [phone]     Social Determinants of Health (SDOH) Interventions    Readmission Risk Interventions     No data to display

## 2021-11-01 NOTE — Progress Notes (Signed)
Physical Therapy Treatment Patient Details Name: Brandy Stevenson MRN: [id number] DOB: [birth year] Today's Date: 11/01/2021   History of Present Illness Pt is a 66 y/o female presenting on 8/29 for planned L axillobifemoral bypass due to known aortic occlusion. S/P revascularization 8/29. PMH includes: arthritis, L breast cancer with radiation and surgery, COPD, HTN, PVD, leriche syndrome, depression.    PT Comments    Pt received supine and agreeable to session with continued focus on gait with rollator for increased activity tolerance and LE strength. Pt continues to need up to min assist to come to standing at start, however pt able to power up without DME at end of session with min guard from EOB at lowest height. Pt with increased ambulation tolerance and improved gait speed for first half of gait training before slowing to very slow speed for last 90'. Continued education re; activity recommendations and benefits of continued mobility with pt verbalizing understanding. Pt continues to benefit from skilled PT services to progress toward functional mobility goals.    Recommendations for follow up therapy are one component of a multi-disciplinary discharge planning process, led by the attending physician.  Recommendations may be updated based on patient status, additional functional criteria and insurance authorization.  Follow Up Recommendations  Home health PT     Assistance Recommended at Discharge Intermittent Supervision/Assistance  Patient can return home with the following A little help with walking and/or transfers;Help with stairs or ramp for entrance;A little help with bathing/dressing/bathroom;Assistance with cooking/housework   Equipment Recommendations  Rollator (4 wheels)    Recommendations for Other Services       Precautions / Restrictions Precautions Precautions: Other (comment) Precaution Comments: VAC     Mobility  Bed Mobility Overal bed mobility: Needs  Assistance Bed Mobility: Supine to Sit, Sit to Supine     Supine to sit: Min guard Sit to supine: Min assist   General bed mobility comments: min asssit to bring BLE into bed,    Transfers Overall transfer level: Needs assistance Equipment used: Rolling walker (2 wheels) Transfers: Sit to/from Stand, Bed to chair/wheelchair/BSC Sit to Stand: Min assist   Step pivot transfers: Min guard       General transfer comment: min assist to power up and steady, cueing for hand placement and safety, min guard at end of session    Ambulation/Gait Ambulation/Gait assistance: Min guard Gait Distance (Feet): 180 Feet Assistive device: Rollator (4 wheels) Gait Pattern/deviations: Decreased step length - right, Decreased step length - left, Step-through pattern Gait velocity: decr     General Gait Details: Assist for safety, first 6' much improved pace, slowly for last 18' after turning around   [employer]             Wheelchair Mobility    Modified Rankin (Stroke Patients Only)       Balance Overall balance assessment: Needs assistance Sitting-balance support: No upper extremity supported, Feet supported Sitting balance-Leahy Scale: Good     Standing balance support: Bilateral upper extremity supported, During functional activity, No upper extremity supported Standing balance-Leahy Scale: Fair Standing balance comment: relies on UE support dyamically but able to engage in ADLs without UE support                            Cognition Arousal/Alertness: Awake/alert Behavior During Therapy: WFL for tasks assessed/performed Overall Cognitive Status: Within Functional Limits for tasks assessed  Exercises General Exercises - Lower Extremity Long Arc Quad: AROM, Right, Left, 10 reps, Seated    General Comments General comments (skin integrity, edema, etc.): VSS on RA      Pertinent Vitals/Pain Pain  Assessment Pain Assessment: Faces Faces Pain Scale: Hurts little more Pain Location: bilateral groin incisions Pain Descriptors / Indicators: Grimacing Pain Intervention(s): Monitored during session, Limited activity within patient's tolerance    Home Living                          Prior Function            PT Goals (current goals can now be found in the care plan section) Acute Rehab PT Goals Patient Stated Goal: return home PT Goal Formulation: With patient Time For Goal Achievement: 11/13/21    Frequency    Min 3X/week      PT Plan      Co-evaluation              AM-PAC PT "6 Clicks" Mobility   Outcome Measure  Help needed turning from your back to your side while in a flat bed without using bedrails?: A Lot Help needed moving from lying on your back to sitting on the side of a flat bed without using bedrails?: A Lot Help needed moving to and from a bed to a chair (including a wheelchair)?: A Little Help needed standing up from a chair using your arms (e.g., wheelchair or bedside chair)?: A Little Help needed to walk in hospital room?: A Little Help needed climbing 3-5 steps with a railing? : A Lot 6 Click Score: 15    End of Session Equipment Utilized During Treatment: Gait belt Activity Tolerance: Patient tolerated treatment well Patient left: with call bell/phone within reach;in bed Nurse Communication: Mobility status PT Visit Diagnosis: Other abnormalities of gait and mobility (R26.89);Pain Pain - Right/Left:  (bilateral) Pain - part of body:  (groin)     Time: 7494-4967 PT Time Calculation (min) (ACUTE ONLY): 27 min  Charges:  $Gait Training: 8-22 mins $Therapeutic Activity: 8-22 mins                     Bethany Cumming R. PTA Acute Rehabilitation Services Office: [place] 11/01/2021, 10:08 AM

## 2021-11-02 NOTE — Progress Notes (Addendum)
Progress Note    11/02/2021 9:00 AM 4 Days Post-Op  Subjective:  Patient is worried about her blood pressure being low and would prefer to stay one more day to make sure everything is okay    Vitals:   11/02/21 0434 11/02/21 0837  BP: 93/62 103/69  Pulse: 63 69  Resp: 15 15  Temp: 98.5 F (36.9 C) (!) 97.5 F (36.4 C)  SpO2: 94% 97%    Physical Exam: Lungs:  nonlabored Incisions:  L infraclavicular incision intact. Bilateral groin incisions with prevena vacs with good seal.  Extremities:  Brisk DP/PT doppler signals bilaterally   CBC    Component Value Date/Time   WBC 8.2 11/01/2021 1206   RBC 2.94 (L) 11/01/2021 1206   HGB 9.6 (L) 11/01/2021 1206   HGB 12.6 08/20/2021 0929   HGB 13.8 06/12/2008 0939   HCT 29.2 (L) 11/01/2021 1206   HCT 37.0 08/20/2021 0929   HCT 40.2 06/12/2008 0939   PLT 160 11/01/2021 1206   PLT 237 08/20/2021 0929   MCV 99.3 11/01/2021 1206   MCV 95 08/20/2021 0929   MCV 90.1 06/12/2008 0939   MCH 32.7 11/01/2021 1206   MCHC 32.9 11/01/2021 1206   RDW 13.8 11/01/2021 1206   RDW 12.9 08/20/2021 0929   RDW 13.9 06/12/2008 0939   LYMPHSABS 1.8 08/07/2021 0940   LYMPHSABS 0.4 (L) 03/10/2017 1335   LYMPHSABS 1.5 06/12/2008 0939   MONOABS 0.5 08/07/2021 0940   MONOABS 0.6 06/12/2008 0939   EOSABS 0.1 08/07/2021 0940   EOSABS 0.1 03/10/2017 1335   BASOSABS 0.1 08/07/2021 0940   BASOSABS 0.0 03/10/2017 1335   BASOSABS 0.0 06/12/2008 0939    BMET    Component Value Date/Time   NA 137 10/30/2021 0530   NA 141 08/20/2021 0929   K 3.7 10/30/2021 0530   CL 100 10/30/2021 0530   CO2 24 10/30/2021 0530   GLUCOSE 114 (H) 10/30/2021 0530   BUN 10 10/30/2021 0530   BUN 12 08/20/2021 0929   CREATININE 0.84 10/30/2021 0530   CREATININE 0.77 04/17/2016 1104   CALCIUM 9.0 10/30/2021 0530   GFRNONAA >60 10/30/2021 0530   GFRNONAA 85 04/17/2016 1104   GFRAA 93 04/28/2019 1222   GFRAA >89 04/17/2016 1104    INR    Component Value  Date/Time   INR 1.0 10/22/2021 0924    No intake or output data in the 24 hours ending 11/02/21 0900   Assessment/Plan:  66 y.o. female is 4 days post op, s/p: left axillobifemoral bypass   -Patient's BP has been soft on usual regime of medications. Yesterday her lisinopril and amlodipine were held. We will continue to hold these medications and she will only receive her carvedilol and hydralazine. She will follow up with her PCP as soon as she can for further management -Bilateral groin incisions with prevena vacs with good seal. She will go home with these and take them off on POD 7 -HHPT has been arranged and patient has been given rollator -Brisk doppler signals in DP/PT bilaterally -We will keep her another day to monitor BP. Hopeful she can go home tomorrow with more family support  Vicente Serene, [place] [facility] [phone] 11/02/2021 9:00 AM  I have seen and evaluated the patient. I agree with the PA note as documented above.  Status post left axillary to bifemoral bypass.  Brisk Doppler signals in both PTs.  Incisional vacs to both groins.  Left axillary incision looks good. Tentative plan  for discharge today but patient remains very nervous about her blood pressure.  States she will have more family support tomorrow.  We will keep her one more day in the hospital.  Plan to give her carvedilol and hydralazine and we will hold her lisinopril and amlodipine.  Blood pressure looks good this morning.  Aspirin statin for risk reduction.  Marty Heck, MD [facility] [place] Office: [phone]

## 2021-11-02 NOTE — Progress Notes (Addendum)
Mobility Specialist: Progress Note   11/02/21 1519  Mobility  Activity Ambulated with assistance in hallway  Level of Assistance Minimal assist, patient does 75% or more  Assistive Device Front wheel walker  Distance Ambulated (ft) 230 ft  Activity Response Tolerated well  $Mobility charge 1 Mobility   Received pt in bed having no complaints and agreeable to mobility. MinA with bed mobility and contact guard during ambulation. Pt was asymptomatic throughout ambulation and returned to room w/o fault. Left in bed w/ call bell in reach and all needs met.  [facility] Johncarlo Maalouf Mobility Specialist Mobility Specialist [address] [place]: [phone]

## 2021-11-03 LAB — BPAM RBC
Blood Product Expiration Date: 202309212359
Blood Product Expiration Date: 202309212359
Unit Type and Rh: 7300
Unit Type and Rh: 7300

## 2021-11-03 LAB — TYPE AND SCREEN
ABO/RH(D): B POS
Antibody Screen: NEGATIVE
Unit division: 0
Unit division: 0

## 2021-11-03 MED ORDER — OXYCODONE-ACETAMINOPHEN 5-325 MG PO TABS
1.0000 | ORAL_TABLET | Freq: Four times a day (QID) | ORAL | 0 refills | Status: DC | PRN
  Filled 2021-11-03: qty 20, 5d supply, fill #0

## 2021-11-03 MED ORDER — ATORVASTATIN CALCIUM 80 MG PO TABS
80.0000 mg | ORAL_TABLET | Freq: Every day | ORAL | 3 refills | Status: DC
  Filled 2021-11-03: qty 30, 30d supply, fill #0

## 2021-11-03 MED ORDER — ATORVASTATIN CALCIUM 80 MG PO TABS: 80.0000 mg | ORAL_TABLET | Freq: Every day | ORAL | 3 refills | Status: DC

## 2021-11-03 MED ORDER — OXYCODONE-ACETAMINOPHEN 5-325 MG PO TABS: 1.0000 | ORAL_TABLET | Freq: Four times a day (QID) | ORAL | 0 refills | Status: DC | PRN

## 2021-11-03 NOTE — Plan of Care (Signed)

## 2021-11-03 NOTE — TOC Transition Note (Addendum)
Transition of Care (TOC) - CM/SW Discharge Note Marvetta Gibbons RN, BSN Transitions of Care Unit 4E- RN Case Manager See Treatment Team for direct phone #    Patient Details  Name: Brandy Stevenson MRN: [id number] Date of Birth: 02-07-1956  Transition of Care [facility]) CM/SW Contact:  Dawayne Patricia, RN Phone Number: 11/03/2021, 12:20 PM   Clinical Narrative:    Pt stable for transition home today, HH has been set up with [facility]- they will call pt to schedule start of care visit.  Rollator has been delivered to room by [employer].  Pt go go home w/ Bil Prevena wound VAC- plan to return to vascular office to have them removed later this week.   No further TOC needs noted.    Final next level of care: [place] Barriers to Discharge: Barriers Resolved   Patient Goals and CMS Choice Patient states their goals for this hospitalization and ongoing recovery are:: return home CMS Medicare.gov Compare Post Acute Care list provided to:: Patient Choice offered to / list presented to : Patient  Discharge Placement                 Home w/ [facility]      Discharge Plan and Services   Discharge Planning Services: CM Consult Post Acute Care Choice: Home Health, Durable Medical Equipment          DME Arranged: Walker rolling with seat DME Agency: [employer] Date DME Agency Contacted: 11/01/21 Time DME Agency Contacted: 1220 Representative spoke with at DME Agency: Jodell Cipro HH Arranged: PT [place]: [place] Date [place]: 11/01/21 Time [place]: [place] Representative spoke with at [place]: [place] Determinants of Health ([place]) Interventions     Readmission Risk Interventions    11/03/2021   12:20 PM  Readmission Risk Prevention Plan  Post Dischage Appt Complete  Medication Screening Complete  Transportation Screening Complete

## 2021-11-03 NOTE — Progress Notes (Addendum)
Progress Note    11/03/2021 9:10 AM 5 Days Post-Op  Subjective:  Patient says she feels good, she is agreeable to go home as long as someone can help her take off her wound vacs    Vitals:   11/03/21 0454 11/03/21 0826  BP: 116/78 105/74  Pulse: 64 66  Resp: 13 14  Temp: 98.2 F (36.8 C) 98.6 F (37 C)  SpO2: 96% 97%    Physical Exam: Lungs:  nonlabored Incisions:  L infraclavicular incision c/d/l. Bilateral groin incisions intact with prevena vacs with good seal.  Extremities:  Brisk doppler signals bilaterally DP/PT Abdomen:  soft, nontender  CBC    Component Value Date/Time   WBC 8.2 11/01/2021 1206   RBC 2.94 (L) 11/01/2021 1206   HGB 9.6 (L) 11/01/2021 1206   HGB 12.6 08/20/2021 0929   HGB 13.8 06/12/2008 0939   HCT 29.2 (L) 11/01/2021 1206   HCT 37.0 08/20/2021 0929   HCT 40.2 06/12/2008 0939   PLT 160 11/01/2021 1206   PLT 237 08/20/2021 0929   MCV 99.3 11/01/2021 1206   MCV 95 08/20/2021 0929   MCV 90.1 06/12/2008 0939   MCH 32.7 11/01/2021 1206   MCHC 32.9 11/01/2021 1206   RDW 13.8 11/01/2021 1206   RDW 12.9 08/20/2021 0929   RDW 13.9 06/12/2008 0939   LYMPHSABS 1.8 08/07/2021 0940   LYMPHSABS 0.4 (L) 03/10/2017 1335   LYMPHSABS 1.5 06/12/2008 0939   MONOABS 0.5 08/07/2021 0940   MONOABS 0.6 06/12/2008 0939   EOSABS 0.1 08/07/2021 0940   EOSABS 0.1 03/10/2017 1335   BASOSABS 0.1 08/07/2021 0940   BASOSABS 0.0 03/10/2017 1335   BASOSABS 0.0 06/12/2008 0939    BMET    Component Value Date/Time   NA 137 10/30/2021 0530   NA 141 08/20/2021 0929   K 3.7 10/30/2021 0530   CL 100 10/30/2021 0530   CO2 24 10/30/2021 0530   GLUCOSE 114 (H) 10/30/2021 0530   BUN 10 10/30/2021 0530   BUN 12 08/20/2021 0929   CREATININE 0.84 10/30/2021 0530   CREATININE 0.77 04/17/2016 1104   CALCIUM 9.0 10/30/2021 0530   GFRNONAA >60 10/30/2021 0530   GFRNONAA 85 04/17/2016 1104   GFRAA 93 04/28/2019 1222   GFRAA >89 04/17/2016 1104    INR     Component Value Date/Time   INR 1.0 10/22/2021 0924     Intake/Output Summary (Last 24 hours) at 11/03/2021 0910 Last data filed at 11/03/2021 0800 Gross per 24 hour  Intake 120 ml  Output 0 ml  Net 120 ml     Assessment/Plan:  66 y.o. female is 5 days post op, s/p: left axillobifemoral bypass   -Patient's BP is good this AM after holding lisinopril and amlodipine yesterday. I have explained to the patient that she can continue to not take these two medications when she is discharged. She will follow up with her PCP when she can after discharge -Bilateral groin incisions with prevena vacs with good seal. She will go home with these.  She will come back to our office sometime this week to have these vacs removed. -Brisk doppler signals in DP/PT bilaterally -D/c home today with HHPT and rollator -Normal follow up appointment is scheduled with Dr.Dickson on 9/21  [facility], PA-C [facility] [phone] 11/03/2021 9:10 AM   I have seen and evaluated the patient. I agree with the PA note as documented above.  66 year old female status post left axillary bifemoral bypass.  She  was concerned about discharge yesterday given her blood pressure issues.  We have been holding her lisinopril and amlodipine and her pressures have been in a good range.  Discussed she continue carvedilol and hydralazine at home.  She needs to follow-up with her PCP.  Left axillary incision looks great.  Both Praveena vacs with good seal.  She has brisk Doppler signals in both feet.  Discussed removing her vacs next week at home about 7 days post procedure just like peeling off a Band-Aid.  She remains very nervous about this as well as her daughter.  Discussed we can arrange for these to be removed in the office.  Aspirin statin for risk reduction.  Marty Heck, MD [facility] [place] Office: [phone]

## 2021-11-03 NOTE — Progress Notes (Signed)
PA notified of prevena wound vac beeping d/t leak on right groin wound vac dressing. States to take right groin dressing off and place gauze over site if moisture/drainage present. Right groin wound vac dressing taken off, gauze dressing applied d/t small amount of drainage at site. Left groin wound vac dressing remains in place with good seal on prevena wound vac. Patient informed of instructions for caring for bilateral groin dressings. Verbalizes instructions.

## 2021-11-03 NOTE — Progress Notes (Signed)
Mobility Specialist: Progress Note   11/03/21 1003  Mobility  Activity Ambulated with assistance in hallway  Level of Assistance Standby assist, set-up cues, supervision of patient - no hands on  Assistive Device Four wheel walker  Distance Ambulated (ft) 260 ft  Activity Response Tolerated well  $Mobility charge 1 Mobility   Pre-Mobility: 68 HR Post-Mobility: 75 HR  Received pt in bed having no complaints and agreeable to mobility. Pt was asymptomatic throughout ambulation and returned to room w/o fault. Left on [facility] w/ call bell in reach and all needs met.  [facility] Brandy Stevenson Mobility Specialist Mobility Specialist [address] [place]: [phone]

## 2021-11-05 ENCOUNTER — Telehealth: Payer: Self-pay | Admitting: *Deleted

## 2021-11-05 ENCOUNTER — Other Ambulatory Visit: Payer: Self-pay

## 2021-11-05 NOTE — Patient Outreach (Signed)
  Care Coordination [facility] Note Transition Care Management Follow-up Telephone Call Date of discharge and from where: 11/03/21 [facility]  How have you been since you were released from the hospital? Patient states she is doing well. Any questions or concerns? No  Items Reviewed: Did the pt receive and understand the discharge instructions provided? Yes  Medications obtained and verified? Yes  Other? No  Any new allergies since your discharge? No  Dietary orders reviewed? Yes Do you have support at home? Yes   Home Care and Equipment/Supplies: Were home health services ordered? yes If so, what is the name of the agency? [facility]  Has the agency set up a time to come to the patient's home? yes Were any new equipment or medical supplies ordered?  Yes: Rollator What is the name of the medical supply agency? [employer] Were you able to get the supplies/equipment? yes Do you have any questions related to the use of the equipment or supplies? No  Functional Questionnaire: (I = Independent and D = Dependent) ADLs: D  Bathing/Dressing- D  Meal Prep- D  Eating- I  Maintaining continence- I  Transferring/Ambulation- D  Managing Meds- I  Follow up appointments reviewed:  PCP Hospital f/u appt confirmed? No   Specialist Hospital f/u appt confirmed? Yes  Scheduled to see Dr. Scot Dock  on 11/21/21 @ 0840; Dr. Nicole Cella office 11/07/21 for wound vac removal. Are transportation arrangements needed? No  If their condition worsens, is the pt aware to call PCP or go to the Emergency Dept.? Yes Was the patient provided with contact information for the PCP's office or ED? Yes Was to pt encouraged to call back with questions or concerns? Yes  SDOH assessments and interventions completed:   Yes  Care Coordination Interventions Activated:  No   Care Coordination Interventions:   N/A     Encounter Outcome:  Pt. Visit Completed    Emelia Loron RN, BSN [place] [phone] [email or web address]

## 2021-11-05 NOTE — Patient Outreach (Signed)
  Care Coordination TOC Note Transition Care Management Unsuccessful Follow-up Telephone Call  Date of discharge and from where:  11/03/21 [facility]  Attempts:  1st Attempt  Reason for unsuccessful TCM follow-up call:  Left voice message.  Emelia Loron RN, BSN [place] [phone] [email or web address]

## 2021-11-06 DIAGNOSIS — J449 Chronic obstructive pulmonary disease, unspecified: Secondary | ICD-10-CM | POA: Diagnosis not present

## 2021-11-06 DIAGNOSIS — G8929 Other chronic pain: Secondary | ICD-10-CM | POA: Diagnosis not present

## 2021-11-06 DIAGNOSIS — I10 Essential (primary) hypertension: Secondary | ICD-10-CM | POA: Diagnosis not present

## 2021-11-06 DIAGNOSIS — M19011 Primary osteoarthritis, right shoulder: Secondary | ICD-10-CM | POA: Diagnosis not present

## 2021-11-06 DIAGNOSIS — I739 Peripheral vascular disease, unspecified: Secondary | ICD-10-CM | POA: Diagnosis not present

## 2021-11-06 DIAGNOSIS — M545 Low back pain, unspecified: Secondary | ICD-10-CM | POA: Diagnosis not present

## 2021-11-06 DIAGNOSIS — Z48812 Encounter for surgical aftercare following surgery on the circulatory system: Secondary | ICD-10-CM | POA: Diagnosis not present

## 2021-11-06 DIAGNOSIS — M5412 Radiculopathy, cervical region: Secondary | ICD-10-CM | POA: Diagnosis not present

## 2021-11-06 DIAGNOSIS — R0789 Other chest pain: Secondary | ICD-10-CM | POA: Diagnosis not present

## 2021-11-06 DIAGNOSIS — E785 Hyperlipidemia, unspecified: Secondary | ICD-10-CM | POA: Diagnosis not present

## 2021-11-06 NOTE — Progress Notes (Signed)
POST OPERATIVE OFFICE NOTE    CC:  F/u for surgery  HPI:  This is a 66 y.o. female who is s/p Left axillobifemoral bypass on 10/29/21 by Dr. Scot Dock. She has had known aortic occlusion.  She presented with progressive ischemia with very short distance claudication and had developed rest pain in the left foot so she was indicated for revascularization.  Of note she had a markedly calcified aorta and for this reason was not felt to be a good candidate for aortofemoral bypass grafting. She presents today for post operative follow up and incisional check with removal of Prevena wound VAC.   She says her rest pain and claudication are resolved. She has been ambulating with rolling walker but says she is getting around well at home. Her right Prevena VAC was removed prior to her d/c on Sunday from rehab. She has had no issues with her incisions. She is starting PT tomorrow 11/08/21. Overall she says she is feeling good. She has been compliant with her Aspirin and statin   Allergies  Allergen Reactions   Vicodin [Hydrocodone-Acetaminophen] Nausea And Vomiting and Other (See Comments)    Sweating   Bactrim [Sulfamethoxazole-Trimethoprim] Nausea And Vomiting    HA    Incruse Ellipta [Umeclidinium Bromide] Other (See Comments)    Dry mouth   Tramadol Nausea And Vomiting   Wellbutrin [Bupropion] Other (See Comments)    Made me feel bad and sleepy    Current Outpatient Medications  Medication Sig Dispense Refill   acetaminophen (TYLENOL) 500 MG tablet Take 1,000 mg by mouth every 8 (eight) hours.     albuterol (PROAIR HFA) 108 (90 Base) MCG/ACT inhaler Inhale 2 puffs into the lungs every 6 (six) hours as needed for wheezing or shortness of breath. 8.5 g 5   aspirin EC 81 MG tablet Take 1 tablet (81 mg total) by mouth daily. 90 tablet 3   atorvastatin (LIPITOR) 80 MG tablet Take 1 tablet (80 mg total) by mouth at bedtime. 30 tablet 3   carvedilol (COREG) 6.25 MG tablet TAKE 1 TABLET (6.25 MG TOTAL) BY  MOUTH 2 (TWO) TIMES DAILY. 180 tablet 1   cilostazol (PLETAL) 50 MG tablet TAKE 1 TABLET BY MOUTH TWICE  DAILY (Patient taking differently: Take 50 mg by mouth daily.) 180 tablet 3   fluticasone-salmeterol (ADVAIR) 250-50 MCG/ACT AEPB Inhale 1 puff into the lungs in the morning and at bedtime. 60 each 6   gabapentin (NEURONTIN) 300 MG capsule TAKE 1 CAPSULE BY MOUTH AT  BEDTIME 90 capsule 0   hydrALAZINE (APRESOLINE) 25 MG tablet Take 1 tablet (25 mg total) by mouth 3 (three) times daily. (Patient taking differently: Take 25 mg by mouth See admin instructions. Take 25 mg twice daily, may take a third 25 mg dose as needed for high blood pressure) 180 tablet 6   meloxicam (MOBIC) 15 MG tablet TAKE 1 TABLET BY MOUTH  DAILY (Patient taking differently: Take 15 mg by mouth daily.) 90 tablet 0   Menthol, Topical Analgesic, (BIOFREEZE ROLL-ON EX) Apply 1 application. topically at bedtime as needed (back pain).     Menthol, Topical Analgesic, (BIOFREEZE) 5 % PTCH Apply 0.5 patches topically daily as needed (pain).     nystatin (MYCOSTATIN) 100000 UNIT/ML suspension TAKE 5 MLS (500,000 UNITS TOTAL) BY MOUTH 4 (FOUR) TIMES DAILY. SWISH AND SWALLOW (Patient taking differently: Use as directed 5 mLs in the mouth or throat 4 (four) times daily as needed (mouth blisters).) 100 mL 0   omeprazole (  PRILOSEC) 40 MG capsule TAKE 1 CAPSULE BY MOUTH DAILY 90 capsule 1   oxyCODONE-acetaminophen (PERCOCET/ROXICET) 5-325 MG tablet Take 1 tablet by mouth every 6 (six) hours as needed for moderate pain. 20 tablet 0   spironolactone (ALDACTONE) 25 MG tablet Take 1 tablet (25 mg total) by mouth daily. 90 tablet 3   potassium chloride (KLOR-CON) 10 MEQ tablet Take 2 tablets (20 mEq total) by mouth once daily. 60 tablet 2   No current facility-administered medications for this visit.     ROS:  See HPI  Physical Exam:  Vitals:   11/07/21 1017  BP: 111/71  Pulse: 61  Temp: 98 F (36.7 C)  TempSrc: Temporal  SpO2: 99%   Weight: 186 lb 12.8 oz (84.7 kg)  Height: '5\' 5"'$  (1.651 m)   General: well appearing, very pleasant, in no distress Cardiac: regular Lungs: non labored Incision:  left infraclavicular incision is c/d/I without swelling or hematoma, B groin incisions well appearing and healing well. No erythema, no swelling, no drainage Extremities:  well perfused and warm. Right palpable DP. Left brisk doppler DP/ PT/ Pero signals Neuro: alert and oriented Abdomen:  obese, soft, non distended  Assessment/Plan:  This is a 66 y.o. female who is s/p: Left axillobifemoral bypass on 10/29/21 by Dr. Scot Dock. She has had known aortic occlusion.  She presented with progressive ischemia with very short distance claudication and had developed rest pain in the left foot so she was indicated for revascularization. Her rest pain and claudication is resolved. Her right Prevena VAC was already removed and her incision looks great. Her left Prevena VAC was removed today and her incision is healing very well. Her left infraclavicular incision is also intact and healing very nicely.  - Encourage continued ambulation as tolerated - Continue Aspirin and Statin - Gave instructions on incisional care - She has follow up appointment on 11/21/21 with Dr. Scot Dock. She can keep this appointment and I will try to add ABI   Karoline Caldwell, PA-C [facility] [phone]  Clinic MD:  Scot Dock

## 2021-11-07 ENCOUNTER — Other Ambulatory Visit: Payer: Self-pay

## 2021-11-07 ENCOUNTER — Ambulatory Visit (INDEPENDENT_AMBULATORY_CARE_PROVIDER_SITE_OTHER): Payer: Medicare Other | Admitting: Physician Assistant

## 2021-11-07 ENCOUNTER — Telehealth: Payer: Self-pay

## 2021-11-07 ENCOUNTER — Telehealth: Payer: Self-pay | Admitting: Internal Medicine

## 2021-11-07 VITALS — BP 111/71 | HR 61 | Temp 98.0°F | Ht 65.0 in | Wt 186.8 lb

## 2021-11-07 DIAGNOSIS — I739 Peripheral vascular disease, unspecified: Secondary | ICD-10-CM

## 2021-11-07 DIAGNOSIS — I7409 Other arterial embolism and thrombosis of abdominal aorta: Secondary | ICD-10-CM

## 2021-11-07 NOTE — Telephone Encounter (Signed)
Brandy Stevenson from [facility]  Best contact: [phone]  Called to report drug interactions: Carvedilol vs fluticasone  Albuterol vs carvedilol  Potassium chloride vs spironolactone  Cilostazal vs meloxicam

## 2021-11-07 NOTE — Telephone Encounter (Signed)
Dr. Wynetta Emery,  All of these are common DDIs that are acknowledged and processed every day. It looks like the pharmacist with this patient's insurance was concerned. Pt has tolerated these combinations in the past with no clinical sequale. Last renal function and K level was nl. No clinically significant bleeding with Pletal and meloxicam. No concern for CBG increase with nasal fluticasone and carvedilol since the fluticasone is topical. Finally, she uses the SABA prn and the carvedilol has been used without producing bronchospasm since 2019. No clinical significance in the past. Are you okay with me calling and okaying these?

## 2021-11-07 NOTE — Telephone Encounter (Signed)
Cherise PT with [facility] called requesting verbal orders for PT for 3 times weekly x 3 weeks.  Returned call, verbal orders given. Confirmed understanding.

## 2021-11-07 NOTE — Telephone Encounter (Signed)
Call placed to [facility] and medications were approved.

## 2021-11-08 DIAGNOSIS — M19011 Primary osteoarthritis, right shoulder: Secondary | ICD-10-CM | POA: Diagnosis not present

## 2021-11-08 DIAGNOSIS — M5412 Radiculopathy, cervical region: Secondary | ICD-10-CM | POA: Diagnosis not present

## 2021-11-08 DIAGNOSIS — I739 Peripheral vascular disease, unspecified: Secondary | ICD-10-CM | POA: Diagnosis not present

## 2021-11-08 DIAGNOSIS — E785 Hyperlipidemia, unspecified: Secondary | ICD-10-CM | POA: Diagnosis not present

## 2021-11-08 DIAGNOSIS — I10 Essential (primary) hypertension: Secondary | ICD-10-CM | POA: Diagnosis not present

## 2021-11-08 DIAGNOSIS — J449 Chronic obstructive pulmonary disease, unspecified: Secondary | ICD-10-CM | POA: Diagnosis not present

## 2021-11-08 DIAGNOSIS — Z48812 Encounter for surgical aftercare following surgery on the circulatory system: Secondary | ICD-10-CM | POA: Diagnosis not present

## 2021-11-08 DIAGNOSIS — R0789 Other chest pain: Secondary | ICD-10-CM | POA: Diagnosis not present

## 2021-11-08 DIAGNOSIS — M545 Low back pain, unspecified: Secondary | ICD-10-CM | POA: Diagnosis not present

## 2021-11-08 DIAGNOSIS — G8929 Other chronic pain: Secondary | ICD-10-CM | POA: Diagnosis not present

## 2021-11-11 ENCOUNTER — Telehealth: Payer: Self-pay

## 2021-11-11 ENCOUNTER — Other Ambulatory Visit: Payer: Self-pay

## 2021-11-11 NOTE — Telephone Encounter (Signed)
Pt called requesting a refill of her pain medication. Pt states her upper shoulder and groin are having some pain.  Reviewed pt's chart, returned call for clarification, two identifiers used. Pt states that she was taking extra-strength Tylenol before the surgery. Informed pt that since it had been nearly 2 weeks since her surgery, she should try restarting the Tylenol. If she has significant pain after that, she was instructed to call the office. Confirmed understanding.

## 2021-11-12 DIAGNOSIS — I739 Peripheral vascular disease, unspecified: Secondary | ICD-10-CM | POA: Diagnosis not present

## 2021-11-12 DIAGNOSIS — I10 Essential (primary) hypertension: Secondary | ICD-10-CM | POA: Diagnosis not present

## 2021-11-12 DIAGNOSIS — Z48812 Encounter for surgical aftercare following surgery on the circulatory system: Secondary | ICD-10-CM | POA: Diagnosis not present

## 2021-11-12 DIAGNOSIS — M5412 Radiculopathy, cervical region: Secondary | ICD-10-CM | POA: Diagnosis not present

## 2021-11-12 DIAGNOSIS — M545 Low back pain, unspecified: Secondary | ICD-10-CM | POA: Diagnosis not present

## 2021-11-12 DIAGNOSIS — G8929 Other chronic pain: Secondary | ICD-10-CM | POA: Diagnosis not present

## 2021-11-12 DIAGNOSIS — E785 Hyperlipidemia, unspecified: Secondary | ICD-10-CM | POA: Diagnosis not present

## 2021-11-12 DIAGNOSIS — M19011 Primary osteoarthritis, right shoulder: Secondary | ICD-10-CM | POA: Diagnosis not present

## 2021-11-12 DIAGNOSIS — J449 Chronic obstructive pulmonary disease, unspecified: Secondary | ICD-10-CM | POA: Diagnosis not present

## 2021-11-12 DIAGNOSIS — R0789 Other chest pain: Secondary | ICD-10-CM | POA: Diagnosis not present

## 2021-11-14 DIAGNOSIS — R0789 Other chest pain: Secondary | ICD-10-CM | POA: Diagnosis not present

## 2021-11-14 DIAGNOSIS — G8929 Other chronic pain: Secondary | ICD-10-CM | POA: Diagnosis not present

## 2021-11-14 DIAGNOSIS — I10 Essential (primary) hypertension: Secondary | ICD-10-CM | POA: Diagnosis not present

## 2021-11-14 DIAGNOSIS — J449 Chronic obstructive pulmonary disease, unspecified: Secondary | ICD-10-CM | POA: Diagnosis not present

## 2021-11-14 DIAGNOSIS — M545 Low back pain, unspecified: Secondary | ICD-10-CM | POA: Diagnosis not present

## 2021-11-14 DIAGNOSIS — M5412 Radiculopathy, cervical region: Secondary | ICD-10-CM | POA: Diagnosis not present

## 2021-11-14 DIAGNOSIS — I739 Peripheral vascular disease, unspecified: Secondary | ICD-10-CM | POA: Diagnosis not present

## 2021-11-14 DIAGNOSIS — E785 Hyperlipidemia, unspecified: Secondary | ICD-10-CM | POA: Diagnosis not present

## 2021-11-14 DIAGNOSIS — M19011 Primary osteoarthritis, right shoulder: Secondary | ICD-10-CM | POA: Diagnosis not present

## 2021-11-14 DIAGNOSIS — Z48812 Encounter for surgical aftercare following surgery on the circulatory system: Secondary | ICD-10-CM | POA: Diagnosis not present

## 2021-11-14 NOTE — Discharge Summary (Signed)
Bypass Discharge Summary Patient ID: Brandy Stevenson [id number] 66 y.o. 30-Mar-1955  Admit date: 10/29/2021  Discharge date and time: 11/03/2021  1:50 PM   Admitting Physician: [name], MD   Discharge Physician: [name], MD  Admission Diagnoses: Aortoiliac occlusive disease ([place]) [I74.09]  Discharge Diagnoses: Aortoiliac occlusive disease ([place]) [I74.09]  Admission Condition: fair  Discharged Condition: good  Indication for Admission: Brandy Stevenson is a 66 y.o. female with a known aortic occlusion.  She presented with progressive ischemia with very short distance claudication and had developed rest pain in the left foot.  She presents for revascularization.  Of note she had a markedly calcified aorta and for this reason was not felt to be a good candidate for aortofemoral bypass grafting.  Hospital Course: Patient was admitted to the hospital on 10/29/2021 and underwent left axillobifemoral bypass. She was transported to the PACU in stable condition without issue. She had prevena incisional vacs placed on bilateral groins.  By POD 1 the patient was ambulating well and eating a normal diet without swallowing difficulties. PT was consulted for evaluation on POD 1, and they suggested HHPT and a rollator.   The patients systolic BP was running in the high 90s and low 100s post op, so her lisinopril and amlodipine were held. Her BP improved holding these two meds.  She was discharged on POD 5 with plans to follow up with our office on POD 7-8 to remove her prevena dressings. She was also sent home with HHPT and a rollator. She was to discontinue taking her home lisinopril and amlodipine, and continue taking her hydralazine and carvedilol, with close PCP follow up.  Consults: None  Treatments: IV hydration, antibiotics: Ancef, analgesia: percocet, cardiac meds: carvedilol and hydralazine, anticoagulation: subcutaneous heparin, therapies: PT, and  surgery: left axillobifemoral bypass    Disposition: Discharge disposition: 01-Home or Self Care       - For VQI Registry use ---  Post-op:  Wound infection: No  Graft infection: No  Transfusion: No  If yes,  units given New Arrhythmia: No Patency judged by: [x ] Dopper only, '[ ]'$  Palpable graft pulse, '[ ]'$  Palpable distal pulse, '[ ]'$  ABI inc. > 0.15, '[ ]'$  Duplex D/C Ambulatory Status: Ambulatory with Assistance  Complications: MI: [ x] No, '[ ]'$  Troponin only, '[ ]'$  EKG or Clinical CHF: No Resp failure: [ x] none, '[ ]'$  Pneumonia, '[ ]'$  Ventilator Chg in renal function: [ x] none, '[ ]'$  Inc. Cr > 0.5, '[ ]'$  Temp. Dialysis, '[ ]'$  Permanent dialysis Stroke: [x ] None, '[ ]'$  Minor, '[ ]'$  Major Return to OR: No  Reason for return to OR: '[ ]'$  Bleeding, '[ ]'$  Infection, '[ ]'$  Thrombosis, '[ ]'$  Revision  Discharge medications: Statin use:  Yes ASA use:  Yes Plavix use:  No  for medical reason easy bleeding Beta blocker use: Yes Coumadin use: No  for medical reason not indicated    Patient Instructions:  Allergies as of 11/03/2021       Reactions   Vicodin [hydrocodone-acetaminophen] Nausea And Vomiting, Other (See Comments)   Sweating   Bactrim [sulfamethoxazole-trimethoprim] Nausea And Vomiting   HA    Incruse Ellipta [umeclidinium Bromide] Other (See Comments)   Dry mouth   Tramadol Nausea And Vomiting   Wellbutrin [bupropion] Other (See Comments)   Made me feel bad and sleepy        Medication List     STOP taking these medications    amLODipine  10 MG tablet Commonly known as: NORVASC   lisinopril 40 MG tablet Commonly known as: ZESTRIL       TAKE these medications    acetaminophen 500 MG tablet Commonly known as: TYLENOL Take 1,000 mg by mouth every 8 (eight) hours.   albuterol 108 (90 Base) MCG/ACT inhaler Commonly known as: ProAir HFA Inhale 2 puffs into the lungs every 6 (six) hours as needed for wheezing or shortness of breath.   aspirin EC 81 MG tablet Take 1  tablet (81 mg total) by mouth daily.   atorvastatin 80 MG tablet Commonly known as: LIPITOR Take 1 tablet (80 mg total) by mouth at bedtime. What changed:  medication strength how much to take when to take this   [place] 1 application. topically at bedtime as needed (back pain).   Biofreeze 5 % Ptch Generic drug: Menthol (Topical Analgesic) Apply 0.5 patches topically daily as needed (pain).   carvedilol 6.25 MG tablet Commonly known as: COREG TAKE 1 TABLET (6.25 MG TOTAL) BY MOUTH 2 (TWO) TIMES DAILY.   cilostazol 50 MG tablet Commonly known as: PLETAL TAKE 1 TABLET BY MOUTH TWICE  DAILY What changed: when to take this   fluticasone-salmeterol 250-50 MCG/ACT Aepb Commonly known as: ADVAIR Inhale 1 puff into the lungs in the morning and at bedtime.   gabapentin 300 MG capsule Commonly known as: NEURONTIN TAKE 1 CAPSULE BY MOUTH AT  BEDTIME   hydrALAZINE 25 MG tablet Commonly known as: APRESOLINE Take 1 tablet (25 mg total) by mouth 3 (three) times daily. What changed:  when to take this additional instructions   meloxicam 15 MG tablet Commonly known as: MOBIC TAKE 1 TABLET BY MOUTH  DAILY   nystatin 100000 UNIT/ML suspension Commonly known as: MYCOSTATIN TAKE 5 MLS (500,000 UNITS TOTAL) BY MOUTH 4 (FOUR) TIMES DAILY. SWISH AND SWALLOW What changed:  how much to take how to take this when to take this reasons to take this   omeprazole 40 MG capsule Commonly known as: PRILOSEC TAKE 1 CAPSULE BY MOUTH DAILY   oxyCODONE-acetaminophen 5-325 MG tablet Commonly known as: PERCOCET/ROXICET Take 1 tablet by mouth every 6 (six) hours as needed for moderate pain.   potassium chloride 10 MEQ tablet Commonly known as: KLOR-CON Take 2 tablets (20 mEq total) by mouth once daily.   spironolactone 25 MG tablet Commonly known as: ALDACTONE Take 1 tablet (25 mg total) by mouth daily.               Discharge Care Instructions  (From  admission, onward)           Start     Ordered   11/03/21 0000  Discharge wound care:       Comments: Remove wound vacs in both groins on 9/5 or 9/6. After, keep incisions clean and dry.   11/03/21 0919           Activity: activity as tolerated Diet: low fat, low cholesterol diet Wound Care: keep wound clean and dry  Follow-up with Dr.Dickson in 2 weeks.  SignedGerri Lins 11/14/2021 8:55 AM

## 2021-11-19 DIAGNOSIS — Z48812 Encounter for surgical aftercare following surgery on the circulatory system: Secondary | ICD-10-CM | POA: Diagnosis not present

## 2021-11-19 DIAGNOSIS — R0789 Other chest pain: Secondary | ICD-10-CM | POA: Diagnosis not present

## 2021-11-19 DIAGNOSIS — J449 Chronic obstructive pulmonary disease, unspecified: Secondary | ICD-10-CM | POA: Diagnosis not present

## 2021-11-19 DIAGNOSIS — I10 Essential (primary) hypertension: Secondary | ICD-10-CM | POA: Diagnosis not present

## 2021-11-19 DIAGNOSIS — G8929 Other chronic pain: Secondary | ICD-10-CM | POA: Diagnosis not present

## 2021-11-19 DIAGNOSIS — M19011 Primary osteoarthritis, right shoulder: Secondary | ICD-10-CM | POA: Diagnosis not present

## 2021-11-19 DIAGNOSIS — E785 Hyperlipidemia, unspecified: Secondary | ICD-10-CM | POA: Diagnosis not present

## 2021-11-19 DIAGNOSIS — I739 Peripheral vascular disease, unspecified: Secondary | ICD-10-CM | POA: Diagnosis not present

## 2021-11-19 DIAGNOSIS — M545 Low back pain, unspecified: Secondary | ICD-10-CM | POA: Diagnosis not present

## 2021-11-19 DIAGNOSIS — M5412 Radiculopathy, cervical region: Secondary | ICD-10-CM | POA: Diagnosis not present

## 2021-11-21 ENCOUNTER — Ambulatory Visit (INDEPENDENT_AMBULATORY_CARE_PROVIDER_SITE_OTHER): Payer: Medicare Other | Admitting: Vascular Surgery

## 2021-11-21 ENCOUNTER — Encounter: Payer: Self-pay | Admitting: Vascular Surgery

## 2021-11-21 ENCOUNTER — Ambulatory Visit (HOSPITAL_COMMUNITY)
Admission: RE | Admit: 2021-11-21 | Discharge: 2021-11-21 | Disposition: A | Discharge status: Home / Self Care | Payer: Medicare Other | Source: Ambulatory Visit | Attending: Vascular Surgery | Admitting: Vascular Surgery

## 2021-11-21 VITALS — BP 138/89 | HR 59 | Temp 98.0°F | Resp 20 | Ht 65.0 in | Wt 174.0 lb

## 2021-11-21 DIAGNOSIS — I739 Peripheral vascular disease, unspecified: Secondary | ICD-10-CM | POA: Diagnosis not present

## 2021-11-21 DIAGNOSIS — Z48812 Encounter for surgical aftercare following surgery on the circulatory system: Secondary | ICD-10-CM

## 2021-11-21 DIAGNOSIS — I7409 Other arterial embolism and thrombosis of abdominal aorta: Secondary | ICD-10-CM

## 2021-11-21 NOTE — Progress Notes (Signed)
Patient name: Brandy Stevenson MRN: [id number] DOB: Mar 25, 1955 Sex: female  REASON FOR VISIT:   Follow-up after left axillobifemoral bypass graft on 10/29/2021.  HPI:   Brandy Stevenson is a pleasant 66 y.o. female who had presented with progressive ischemia and rest pain of the left foot.  She had a known aortic occlusion.  She had a markedly calcified aorta and for this reason I felt that aortobifemoral bypass grafting was the best option.  She underwent a left axillobifemoral bypass graft on 10/29/2021.  The rest pain and claudication resolved after surgery.  She comes in for an outpatient visit.  Since she has been home she has been getting therapy and gradually resuming her activity.  She denies fever or chills.  Current Outpatient Medications  Medication Sig Dispense Refill   acetaminophen (TYLENOL) 500 MG tablet Take 1,000 mg by mouth every 8 (eight) hours.     albuterol (PROAIR HFA) 108 (90 Base) MCG/ACT inhaler Inhale 2 puffs into the lungs every 6 (six) hours as needed for wheezing or shortness of breath. 8.5 g 5   aspirin EC 81 MG tablet Take 1 tablet (81 mg total) by mouth daily. 90 tablet 3   atorvastatin (LIPITOR) 80 MG tablet Take 1 tablet (80 mg total) by mouth at bedtime. 30 tablet 3   carvedilol (COREG) 6.25 MG tablet TAKE 1 TABLET (6.25 MG TOTAL) BY MOUTH 2 (TWO) TIMES DAILY. 180 tablet 1   cilostazol (PLETAL) 50 MG tablet TAKE 1 TABLET BY MOUTH TWICE  DAILY (Patient taking differently: Take 50 mg by mouth daily.) 180 tablet 3   fluticasone-salmeterol (ADVAIR) 250-50 MCG/ACT AEPB Inhale 1 puff into the lungs in the morning and at bedtime. 60 each 6   gabapentin (NEURONTIN) 300 MG capsule TAKE 1 CAPSULE BY MOUTH AT  BEDTIME 90 capsule 0   hydrALAZINE (APRESOLINE) 25 MG tablet Take 1 tablet (25 mg total) by mouth 3 (three) times daily. (Patient taking differently: Take 25 mg by mouth See admin instructions. Take 25 mg twice daily, may take a third 25 mg dose as  needed for high blood pressure) 180 tablet 6   meloxicam (MOBIC) 15 MG tablet TAKE 1 TABLET BY MOUTH  DAILY (Patient taking differently: Take 15 mg by mouth daily.) 90 tablet 0   Menthol, Topical Analgesic, (BIOFREEZE ROLL-ON EX) Apply 1 application. topically at bedtime as needed (back pain).     Menthol, Topical Analgesic, (BIOFREEZE) 5 % PTCH Apply 0.5 patches topically daily as needed (pain).     nystatin (MYCOSTATIN) 100000 UNIT/ML suspension TAKE 5 MLS (500,000 UNITS TOTAL) BY MOUTH 4 (FOUR) TIMES DAILY. SWISH AND SWALLOW (Patient taking differently: Use as directed 5 mLs in the mouth or throat 4 (four) times daily as needed (mouth blisters).) 100 mL 0   omeprazole (PRILOSEC) 40 MG capsule TAKE 1 CAPSULE BY MOUTH DAILY 90 capsule 1   oxyCODONE-acetaminophen (PERCOCET/ROXICET) 5-325 MG tablet Take 1 tablet by mouth every 6 (six) hours as needed for moderate pain. 20 tablet 0   potassium chloride (KLOR-CON) 10 MEQ tablet Take 2 tablets (20 mEq total) by mouth once daily. 60 tablet 2   spironolactone (ALDACTONE) 25 MG tablet Take 1 tablet (25 mg total) by mouth daily. 90 tablet 3   No current facility-administered medications for this visit.    REVIEW OF SYSTEMS:  '[X]'$  denotes positive finding, '[ ]'$  denotes negative finding Vascular    Leg swelling    Cardiac    Chest pain or chest  pressure:    Shortness of breath upon exertion:    Short of breath when lying flat:    Irregular heart rhythm:    Constitutional    Fever or chills:     PHYSICAL EXAM:   Vitals:   11/21/21 0842  BP: 138/89  Pulse: (!) 59  Resp: 20  Temp: 98 F (36.7 C)  SpO2: 98%  Weight: 174 lb (78.9 kg)  Height: '5\' 5"'$  (1.651 m)    GENERAL: The patient is a well-nourished female, in no acute distress. The vital signs are documented above. CARDIOVASCULAR: There is a regular rate and rhythm. PULMONARY: There is good air exchange bilaterally without wheezing or rales. VASCULAR: Her 3 incisions all look good and are  healing nicely. She has palpable pedal pulses.  DATA:   ARTERIAL DOPPLER STUDY: I independently interpreted her arterial Doppler study today.  On the right side there is a biphasic dorsalis pedis and posterior tibial signal.  ABI is 97%.  Toe pressures 122 mmHg.  On the left side there is a biphasic dorsalis pedis and posterior tibial signal.  ABIs 97%.  Toe pressures 133 mmHg.  MEDICAL ISSUES:   S/P LEFT AXILLOBIFEMORAL BYPASS: Her bypass graft is patent with palpable pedal pulses.  Her symptoms in her legs have resolved.  I have encouraged her to stay as active as possible.  She is not a smoker.  She is on aspirin and is on a statin.  I have ordered a graft duplex and ABIs in 6 months and I will see her back at that time.  She knows to call sooner if she has problems.  Deitra Mayo [facility] [place] [phone]

## 2021-11-22 DIAGNOSIS — R0789 Other chest pain: Secondary | ICD-10-CM | POA: Diagnosis not present

## 2021-11-22 DIAGNOSIS — M545 Low back pain, unspecified: Secondary | ICD-10-CM | POA: Diagnosis not present

## 2021-11-22 DIAGNOSIS — E785 Hyperlipidemia, unspecified: Secondary | ICD-10-CM | POA: Diagnosis not present

## 2021-11-22 DIAGNOSIS — Z48812 Encounter for surgical aftercare following surgery on the circulatory system: Secondary | ICD-10-CM | POA: Diagnosis not present

## 2021-11-22 DIAGNOSIS — G8929 Other chronic pain: Secondary | ICD-10-CM | POA: Diagnosis not present

## 2021-11-22 DIAGNOSIS — J449 Chronic obstructive pulmonary disease, unspecified: Secondary | ICD-10-CM | POA: Diagnosis not present

## 2021-11-22 DIAGNOSIS — I739 Peripheral vascular disease, unspecified: Secondary | ICD-10-CM | POA: Diagnosis not present

## 2021-11-22 DIAGNOSIS — I10 Essential (primary) hypertension: Secondary | ICD-10-CM | POA: Diagnosis not present

## 2021-11-22 DIAGNOSIS — M19011 Primary osteoarthritis, right shoulder: Secondary | ICD-10-CM | POA: Diagnosis not present

## 2021-11-22 DIAGNOSIS — M5412 Radiculopathy, cervical region: Secondary | ICD-10-CM | POA: Diagnosis not present

## 2021-11-25 ENCOUNTER — Other Ambulatory Visit: Payer: Self-pay | Admitting: Internal Medicine

## 2021-11-25 DIAGNOSIS — Z760 Encounter for issue of repeat prescription: Secondary | ICD-10-CM

## 2021-11-25 DIAGNOSIS — I70219 Atherosclerosis of native arteries of extremities with intermittent claudication, unspecified extremity: Secondary | ICD-10-CM

## 2021-11-26 NOTE — Telephone Encounter (Signed)
Requested Prescriptions  Pending Prescriptions Disp Refills  . gabapentin (NEURONTIN) 300 MG capsule [Pharmacy Med Name: Gabapentin 300 MG Oral Capsule] 100 capsule 2    Sig: TAKE 1 CAPSULE BY MOUTH AT  BEDTIME     Neurology: Anticonvulsants - gabapentin Passed - 11/25/2021 11:15 AM      Passed - Cr in normal range and within 360 days    Creat  Date Value Ref Range Status  04/17/2016 0.77 0.50 - 1.05 mg/dL Final    Comment:      For patients > or = 65 years of age: The upper reference limit for Creatinine is approximately 13% higher for people identified as African-American.      Creatinine, Ser  Date Value Ref Range Status  10/30/2021 0.84 0.44 - 1.00 mg/dL Final         Passed - Completed PHQ-2 or PHQ-9 in the last 360 days      Passed - Valid encounter within last 12 months    Recent Outpatient Visits          5 months ago Essential hypertension   [place], MD   9 months ago Essential hypertension   [place], Deborah B, MD   1 year ago Encounter for [employer] annual wellness exam   [place] [name], MD   1 year ago Essential hypertension   [place], MD   1 year ago Abscess of abdominal wall   [place], MD      Future Appointments            In 2 weeks [name], MD [place]           . meloxicam (MOBIC) 15 MG tablet [Pharmacy Med Name: Meloxicam 15 MG Oral Tablet] 100 tablet 0    Sig: TAKE 1 TABLET BY MOUTH DAILY     Analgesics:  COX2 Inhibitors Failed - 11/25/2021 11:15 AM      Failed - Manual Review: Labs are only required if the patient has taken medication for more than 8 weeks.      Failed - HGB in normal range and within 360 days    Hemoglobin  Date Value Ref Range Status   11/01/2021 9.6 (L) 12.0 - 15.0 g/dL Final  08/20/2021 12.6 11.1 - 15.9 g/dL Final   HGB  Date Value Ref Range Status  06/12/2008 13.8 11.6 - 15.9 g/dL Final         Failed - HCT in normal range and within 360 days    HCT  Date Value Ref Range Status  11/01/2021 29.2 (L) 36.0 - 46.0 % Final  06/12/2008 40.2 34.8 - 46.6 % Final   Hematocrit  Date Value Ref Range Status  08/20/2021 37.0 34.0 - 46.6 % Final         Passed - Cr in normal range and within 360 days    Creat  Date Value Ref Range Status  04/17/2016 0.77 0.50 - 1.05 mg/dL Final    Comment:      For patients > or = 65 years of age: The upper reference limit for Creatinine is approximately 13% higher for people identified as African-American.      Creatinine, Ser  Date Value Ref Range Status  10/30/2021 0.84 0.44 - 1.00 mg/dL Final  Passed - AST in normal range and within 360 days    AST  Date Value Ref Range Status  10/22/2021 20 15 - 41 U/L Final         Passed - ALT in normal range and within 360 days    ALT  Date Value Ref Range Status  10/22/2021 25 0 - 44 U/L Final         Passed - eGFR is 30 or above and within 360 days    GFR, Est African American  Date Value Ref Range Status  04/17/2016 >89 >=60 mL/min Final   GFR calc Af Amer  Date Value Ref Range Status  04/28/2019 93 >59 mL/min/1.73 Final   GFR, Est Non African American  Date Value Ref Range Status  04/17/2016 85 >=60 mL/min Final   GFR, Estimated  Date Value Ref Range Status  10/30/2021 >60 >60 mL/min Final    Comment:    (NOTE) Calculated using the CKD-EPI Creatinine Equation (2021)    eGFR  Date Value Ref Range Status  08/20/2021 76 >59 mL/min/1.73 Final         Passed - Patient is not pregnant      Passed - Valid encounter within last 12 months    Recent Outpatient Visits          5 months ago Essential hypertension   [place], MD   9 months ago Essential  hypertension   [place], Deborah B, MD   1 year ago Encounter for [employer] annual wellness exam   [place], Deborah B, MD   1 year ago Essential hypertension   [place], MD   1 year ago Abscess of abdominal wall   [place], MD      Future Appointments            In 2 weeks [name], MD [place]

## 2021-11-27 ENCOUNTER — Other Ambulatory Visit: Payer: Self-pay

## 2021-11-27 DIAGNOSIS — M5412 Radiculopathy, cervical region: Secondary | ICD-10-CM | POA: Diagnosis not present

## 2021-11-27 DIAGNOSIS — I10 Essential (primary) hypertension: Secondary | ICD-10-CM | POA: Diagnosis not present

## 2021-11-27 DIAGNOSIS — G8929 Other chronic pain: Secondary | ICD-10-CM | POA: Diagnosis not present

## 2021-11-27 DIAGNOSIS — I739 Peripheral vascular disease, unspecified: Secondary | ICD-10-CM

## 2021-11-27 DIAGNOSIS — M7989 Other specified soft tissue disorders: Secondary | ICD-10-CM

## 2021-11-27 DIAGNOSIS — M19011 Primary osteoarthritis, right shoulder: Secondary | ICD-10-CM | POA: Diagnosis not present

## 2021-11-27 DIAGNOSIS — J449 Chronic obstructive pulmonary disease, unspecified: Secondary | ICD-10-CM | POA: Diagnosis not present

## 2021-11-27 DIAGNOSIS — E785 Hyperlipidemia, unspecified: Secondary | ICD-10-CM | POA: Diagnosis not present

## 2021-11-27 DIAGNOSIS — Z48812 Encounter for surgical aftercare following surgery on the circulatory system: Secondary | ICD-10-CM | POA: Diagnosis not present

## 2021-11-27 DIAGNOSIS — R0789 Other chest pain: Secondary | ICD-10-CM | POA: Diagnosis not present

## 2021-11-27 DIAGNOSIS — M545 Low back pain, unspecified: Secondary | ICD-10-CM | POA: Diagnosis not present

## 2021-12-05 DIAGNOSIS — J449 Chronic obstructive pulmonary disease, unspecified: Secondary | ICD-10-CM | POA: Diagnosis not present

## 2021-12-05 DIAGNOSIS — M545 Low back pain, unspecified: Secondary | ICD-10-CM | POA: Diagnosis not present

## 2021-12-05 DIAGNOSIS — G8929 Other chronic pain: Secondary | ICD-10-CM | POA: Diagnosis not present

## 2021-12-05 DIAGNOSIS — I739 Peripheral vascular disease, unspecified: Secondary | ICD-10-CM | POA: Diagnosis not present

## 2021-12-05 DIAGNOSIS — Z48812 Encounter for surgical aftercare following surgery on the circulatory system: Secondary | ICD-10-CM | POA: Diagnosis not present

## 2021-12-05 DIAGNOSIS — M19011 Primary osteoarthritis, right shoulder: Secondary | ICD-10-CM | POA: Diagnosis not present

## 2021-12-05 DIAGNOSIS — R0789 Other chest pain: Secondary | ICD-10-CM | POA: Diagnosis not present

## 2021-12-05 DIAGNOSIS — E785 Hyperlipidemia, unspecified: Secondary | ICD-10-CM | POA: Diagnosis not present

## 2021-12-05 DIAGNOSIS — M5412 Radiculopathy, cervical region: Secondary | ICD-10-CM | POA: Diagnosis not present

## 2021-12-05 DIAGNOSIS — I10 Essential (primary) hypertension: Secondary | ICD-10-CM | POA: Diagnosis not present

## 2021-12-06 DIAGNOSIS — I10 Essential (primary) hypertension: Secondary | ICD-10-CM | POA: Diagnosis not present

## 2021-12-09 ENCOUNTER — Other Ambulatory Visit: Payer: Self-pay | Admitting: Internal Medicine

## 2021-12-09 ENCOUNTER — Other Ambulatory Visit: Payer: Self-pay

## 2021-12-09 DIAGNOSIS — E875 Hyperkalemia: Secondary | ICD-10-CM

## 2021-12-09 MED ORDER — POTASSIUM CHLORIDE ER 10 MEQ PO TBCR
20.0000 meq | EXTENDED_RELEASE_TABLET | Freq: Every day | ORAL | 0 refills | Status: DC
  Filled 2021-12-09: qty 60, 30d supply, fill #0

## 2021-12-10 ENCOUNTER — Other Ambulatory Visit: Payer: Self-pay

## 2021-12-11 DIAGNOSIS — E785 Hyperlipidemia, unspecified: Secondary | ICD-10-CM | POA: Diagnosis not present

## 2021-12-11 DIAGNOSIS — M545 Low back pain, unspecified: Secondary | ICD-10-CM | POA: Diagnosis not present

## 2021-12-11 DIAGNOSIS — Z48812 Encounter for surgical aftercare following surgery on the circulatory system: Secondary | ICD-10-CM | POA: Diagnosis not present

## 2021-12-11 DIAGNOSIS — G8929 Other chronic pain: Secondary | ICD-10-CM | POA: Diagnosis not present

## 2021-12-11 DIAGNOSIS — R0789 Other chest pain: Secondary | ICD-10-CM | POA: Diagnosis not present

## 2021-12-11 DIAGNOSIS — I10 Essential (primary) hypertension: Secondary | ICD-10-CM | POA: Diagnosis not present

## 2021-12-11 DIAGNOSIS — I739 Peripheral vascular disease, unspecified: Secondary | ICD-10-CM | POA: Diagnosis not present

## 2021-12-11 DIAGNOSIS — M19011 Primary osteoarthritis, right shoulder: Secondary | ICD-10-CM | POA: Diagnosis not present

## 2021-12-11 DIAGNOSIS — J449 Chronic obstructive pulmonary disease, unspecified: Secondary | ICD-10-CM | POA: Diagnosis not present

## 2021-12-11 DIAGNOSIS — M5412 Radiculopathy, cervical region: Secondary | ICD-10-CM | POA: Diagnosis not present

## 2021-12-12 ENCOUNTER — Encounter: Payer: Self-pay | Admitting: Internal Medicine

## 2021-12-12 ENCOUNTER — Ambulatory Visit: Payer: Medicare Other | Attending: Internal Medicine | Admitting: Internal Medicine

## 2021-12-12 VITALS — BP 113/74 | HR 66 | Temp 98.6°F | Ht 65.0 in | Wt 187.4 lb

## 2021-12-12 DIAGNOSIS — Z2821 Immunization not carried out because of patient refusal: Secondary | ICD-10-CM | POA: Diagnosis not present

## 2021-12-12 DIAGNOSIS — D649 Anemia, unspecified: Secondary | ICD-10-CM

## 2021-12-12 DIAGNOSIS — Z28310 Unvaccinated for covid-19: Secondary | ICD-10-CM | POA: Diagnosis not present

## 2021-12-12 DIAGNOSIS — I739 Peripheral vascular disease, unspecified: Secondary | ICD-10-CM | POA: Diagnosis not present

## 2021-12-12 DIAGNOSIS — I10 Essential (primary) hypertension: Secondary | ICD-10-CM

## 2021-12-12 DIAGNOSIS — Z1231 Encounter for screening mammogram for malignant neoplasm of breast: Secondary | ICD-10-CM | POA: Diagnosis not present

## 2021-12-12 DIAGNOSIS — E66811 Obesity, class 1: Secondary | ICD-10-CM

## 2021-12-12 DIAGNOSIS — E669 Obesity, unspecified: Secondary | ICD-10-CM

## 2021-12-12 NOTE — Progress Notes (Signed)
Patient ID: Brandy Stevenson, female    DOB: October 11, 1955  MRN: [id number]  CC: Hypertension   Subjective: Brandy Stevenson is a 66 y.o. female who presents for chronic ds management Her concerns today include:  Hx of PAD, HTN, HL, former tob dep, LT breast CA (lumpectomy and XRT 2009), GERD, COPD, Trigeminal neuralgia RT, dep/anxiety, OA shoulders  Had axillobifemoral bypass graft due to rest pain in legs with hx of PAD Doing well post surgery.  Will finish PT therapy session next wk.  Plans to go to gym with her husband 2-3x/wk once she is done with P.T.  No rest pain in legs Saw Dr. Scot Dock in f/u 11/21/2021.  She remains on aspirin and atorvastatin.  She wonders whether she still needs to take Pletal.  She has been taking it.  HTN:  BP was low post surgery.  Lisinopril and Norvasc was d/c. BP started increasing once she got home.  Seen in [facility] 12/06/2021.  Restarted on Norvasc but only at 2.5 mg instead of previous dose of 10 mg.  BP remained high for 2 days.  However, BP reading for last 3 days much better.  She has log book with her:  135/79, 100/58, 123/70. Pulse in 60s -currently on Norvasc 2.5 mg, Coreg 6.25 BID, Hydralazine 25 mg TID and Spiro 25 mg. Still on K+ supplement Atorvastatin increased from 40 mg to 80 mg Obesity: feels she is doing better with eating habits.  Still drinks sodas 2 cans a day  Post surgery anemia with h/h from 12.6/37 to 9.6/29.2  HM:  due for MMG. Declines flu and shingrix.  Declines COVID series and PCV.    Patient Active Problem List   Diagnosis Date Noted   Prediabetes 06/15/2021   Influenza vaccine refused 02/03/2020   Resistant hypertension 01/20/2020   Tubular adenoma of colon 04/27/2019   Cervical radiculopathy 10/01/2018   Hypertensive retinopathy 09/08/2018   Seasonal allergies 06/29/2018   Aortoiliac occlusive disease ([place]) 11/04/2017   DOE (dyspnea on exertion) 11/03/2017   Chronic chest wall pain 11/03/2017   Ichthyosis  10/06/2017   Former smoker 04/07/2017   Primary osteoarthritis of right shoulder 04/07/2017   Chronic midline low back pain without sciatica 04/07/2017   Hiatal hernia 05/01/2016   DJD (degenerative joint disease) of cervical spine 04/17/2016   History of breast cancer 02/18/2016   Foot callus 02/18/2016   Anxiety and depression 05/22/2015   Insomnia 03/15/2015   Unspecified vitamin D deficiency 04/08/2013   Hyperlipidemia with target LDL less than 70 04/08/2013   Atherosclerotic PVD with intermittent claudication ([place]) 03/22/2013   Accelerated hypertension 02/23/2013   GERD (gastroesophageal reflux disease) 02/23/2013   COPD (chronic obstructive pulmonary disease) ([place]) 02/23/2013     Current Outpatient Medications on File Prior to Visit  Medication Sig Dispense Refill   acetaminophen (TYLENOL) 500 MG tablet Take 1,000 mg by mouth every 8 (eight) hours.     albuterol (PROAIR HFA) 108 (90 Base) MCG/ACT inhaler Inhale 2 puffs into the lungs every 6 (six) hours as needed for wheezing or shortness of breath. 8.5 g 5   aspirin EC 81 MG tablet Take 1 tablet (81 mg total) by mouth daily. 90 tablet 3   atorvastatin (LIPITOR) 80 MG tablet Take 1 tablet (80 mg total) by mouth at bedtime. 30 tablet 3   carvedilol (COREG) 6.25 MG tablet TAKE 1 TABLET (6.25 MG TOTAL) BY MOUTH 2 (TWO) TIMES DAILY. 180 tablet 1   cilostazol (PLETAL) 50 MG  tablet TAKE 1 TABLET BY MOUTH TWICE  DAILY (Patient taking differently: Take 50 mg by mouth daily.) 180 tablet 3   fluticasone-salmeterol (ADVAIR) 250-50 MCG/ACT AEPB Inhale 1 puff into the lungs in the morning and at bedtime. 60 each 6   gabapentin (NEURONTIN) 300 MG capsule TAKE 1 CAPSULE BY MOUTH AT  BEDTIME 100 capsule 2   hydrALAZINE (APRESOLINE) 25 MG tablet Take 1 tablet (25 mg total) by mouth 3 (three) times daily. (Patient taking differently: Take 25 mg by mouth See admin instructions. Take 25 mg twice daily, may take a third 25 mg dose as needed for high  blood pressure) 180 tablet 6   meloxicam (MOBIC) 15 MG tablet TAKE 1 TABLET BY MOUTH DAILY 100 tablet 0   nystatin (MYCOSTATIN) 100000 UNIT/ML suspension TAKE 5 MLS (500,000 UNITS TOTAL) BY MOUTH 4 (FOUR) TIMES DAILY. SWISH AND SWALLOW (Patient taking differently: Use as directed 5 mLs in the mouth or throat 4 (four) times daily as needed (mouth blisters).) 100 mL 0   omeprazole (PRILOSEC) 40 MG capsule TAKE 1 CAPSULE BY MOUTH DAILY 90 capsule 1   potassium chloride (KLOR-CON) 10 MEQ tablet Take 2 tablets (20 mEq total) by mouth once daily. (Please keep upcoming appointment for additional refills) 60 tablet 0   spironolactone (ALDACTONE) 25 MG tablet Take 1 tablet (25 mg total) by mouth daily. 90 tablet 3   Menthol, Topical Analgesic, (BIOFREEZE ROLL-ON EX) Apply 1 application. topically at bedtime as needed (back pain). (Patient not taking: Reported on 12/12/2021)     Menthol, Topical Analgesic, (BIOFREEZE) 5 % PTCH Apply 0.5 patches topically daily as needed (pain). (Patient not taking: Reported on 12/12/2021)     oxyCODONE-acetaminophen (PERCOCET/ROXICET) 5-325 MG tablet Take 1 tablet by mouth every 6 (six) hours as needed for moderate pain. (Patient not taking: Reported on 12/12/2021) 20 tablet 0   No current facility-administered medications on file prior to visit.    Allergies  Allergen Reactions   Vicodin [Hydrocodone-Acetaminophen] Nausea And Vomiting and Other (See Comments)    Sweating   Bactrim [Sulfamethoxazole-Trimethoprim] Nausea And Vomiting    HA    Incruse Ellipta [Umeclidinium Bromide] Other (See Comments)    Dry mouth   Tramadol Nausea And Vomiting   Wellbutrin [Bupropion] Other (See Comments)    Made me feel bad and sleepy    Social History   Socioeconomic History   Marital status: Married    Spouse name: Not on file   Number of children: Not on file   Years of education: Not on file   Highest education level: Not on file  Occupational History   Not on file   Tobacco Use   Smoking status: Former    Packs/day: 0.25    Years: 40.00    Total pack years: 10.00    Types: Cigarettes    Quit date: 02/08/2017    Years since quitting: 4.8    Passive exposure: Never   Smokeless tobacco: Never  Vaping Use   Vaping Use: Never used  Substance and Sexual Activity   Alcohol use: No   Drug use: No   Sexual activity: Yes  Other Topics Concern   Not on file  Social History Narrative   Not on file   Social Determinants of Health   Financial Resource Strain: Not on file  Food Insecurity: No Food Insecurity (11/05/2021)   Hunger Vital Sign    Worried About Running Out of Food in the Last Year: Never true  Ran Out of Food in the Last Year: Never true  Transportation Needs: No Transportation Needs (11/05/2021)   PRAPARE - Hydrologist (Medical): No    Lack of Transportation (Non-Medical): No  Physical Activity: Not on file  Stress: Not on file  Social Connections: Not on file  Intimate Partner Violence: Not on file    Family History  Problem Relation Age of Onset   Hyperlipidemia Mother    Hypertension Mother    Heart disease Mother        Atrial Fib.   Hyperlipidemia Sister    Hypertension Sister    Heart disease Sister        Before age 95,  CHF   Heart attack Brother    Hyperlipidemia Brother    Hypertension Brother    Heart disease Brother    Hypertension Daughter     Past Surgical History:  Procedure Laterality Date   APPENDECTOMY  1980   AXILLARY-FEMORAL BYPASS GRAFT Bilateral 10/29/2021   Procedure: LEFT AXILLOBIFEMORAL BYPASS GRAFT;  Surgeon: [name], MD;  Location: [facility] OR;  Service: Vascular;  Laterality: Bilateral;   BREAST LUMPECTOMY Left    s/p radiation therapy    BREAST SURGERY     COLONOSCOPY  07/2015   COLONOSCOPY WITH PROPOFOL N/A 06/26/2015   Procedure: COLONOSCOPY WITH PROPOFOL;  Surgeon: [name], MD;  Location: [facility] END[facility]COPY;  Service: Endoscopy;  Laterality: N/A;    CT CTA CORONARY W/CA SCORE W/CM &/OR WO/CM  12/10/2017   Coronary calcium score is 0.  No evidence of CAD.   PERIPHERAL VASCULAR CATHETERIZATION N/A 10/22/2015   Procedure: Abdominal Aortogram w/Lower Extremity;  Surgeon: [name], MD;  Location: [facility] INVASIVE CV LAB: Aortic arch widely patent as far as innominate artery, right subclavian artery, right vertebral artery and right, common carotid.  Bilateral single renal arteries - Nonocclusive disease. Infrarenal aorta 100% - bilateral common Iliac 100% --> B Ex Iliacs fill via Int Iliac. Leg OK   TRANSTHORACIC ECHOCARDIOGRAM  08/2017   EF 60-65%. No RWMA.  Normal DF.  Normal valves.    TUBAL LIGATION  1991    ROS: Review of Systems Negative except as stated above  PHYSICAL EXAM: BP 113/74   Pulse 66   Temp 98.6 F (37 C) (Oral)   Ht '5\' 5"'$  (1.651 m)   Wt 187 lb 6.4 oz (85 kg)   SpO2 98%   BMI 31.18 kg/m   Wt Readings from Last 3 Encounters:  12/12/21 187 lb 6.4 oz (85 kg)  11/21/21 174 lb (78.9 kg)  11/07/21 186 lb 12.8 oz (84.7 kg)    Physical Exam  General appearance - alert, well appearing, and in no distress Mental status - normal mood, behavior, speech, dress, motor activity, and thought processes Neck - supple, no significant adenopathy Chest - clear to auscultation, no wheezes, rales or rhonchi, symmetric air entry Heart - normal rate, regular rhythm, normal S1, S2, no murmurs, rubs, clicks or gallops Extremities - peripheral pulses normal, no pedal edema, no clubbing or cyanosis     Latest Ref Rng & Units 10/30/2021    5:30 AM 10/22/2021    9:24 AM 08/20/2021    9:29 AM  CMP  Glucose 70 - 99 mg/dL 114  92  90   BUN 8 - 23 mg/dL '10  10  12   '$ Creatinine 0.44 - 1.00 mg/dL 0.84  0.82  0.85   Sodium 135 - 145 mmol/L 137  139  141   Potassium 3.5 - 5.1 mmol/L 3.7  3.5  4.3   Chloride 98 - 111 mmol/L 100  107  104   CO2 22 - 32 mmol/L '24  23  21   '$ Calcium 8.9 - 10.3 mg/dL 9.0  9.0  9.5   Total Protein 6.5  - 8.1 g/dL  7.6    Total Bilirubin 0.3 - 1.2 mg/dL  0.4    Alkaline Phos 38 - 126 U/L  66    AST 15 - 41 U/L  20    ALT 0 - 44 U/L  25     Lipid Panel     Component Value Date/Time   CHOL 117 10/30/2021 0530   CHOL 150 06/14/2021 1458   TRIG 49 10/30/2021 0530   HDL 54 10/30/2021 0530   HDL 67 06/14/2021 1458   CHOLHDL 2.2 10/30/2021 0530   VLDL 10 10/30/2021 0530   LDLCALC 53 10/30/2021 0530   LDLCALC 65 06/14/2021 1458    CBC    Component Value Date/Time   WBC 8.2 11/01/2021 1206   RBC 2.94 (L) 11/01/2021 1206   HGB 9.6 (L) 11/01/2021 1206   HGB 12.6 08/20/2021 0929   HGB 13.8 06/12/2008 0939   HCT 29.2 (L) 11/01/2021 1206   HCT 37.0 08/20/2021 0929   HCT 40.2 06/12/2008 0939   PLT 160 11/01/2021 1206   PLT 237 08/20/2021 0929   MCV 99.3 11/01/2021 1206   MCV 95 08/20/2021 0929   MCV 90.1 06/12/2008 0939   MCH 32.7 11/01/2021 1206   MCHC 32.9 11/01/2021 1206   RDW 13.8 11/01/2021 1206   RDW 12.9 08/20/2021 0929   RDW 13.9 06/12/2008 0939   LYMPHSABS 1.8 08/07/2021 0940   LYMPHSABS 0.4 (L) 03/10/2017 1335   LYMPHSABS 1.5 06/12/2008 0939   MONOABS 0.5 08/07/2021 0940   MONOABS 0.6 06/12/2008 0939   EOSABS 0.1 08/07/2021 0940   EOSABS 0.1 03/10/2017 1335   BASOSABS 0.1 08/07/2021 0940   BASOSABS 0.0 03/10/2017 1335   BASOSABS 0.0 06/12/2008 0939    ASSESSMENT AND PLAN:  1. Essential hypertension At goal. Continue hydralazine 25 mg 3 times a day, carvedilol 6.25 mg twice a day, spironolactone 25 mg daily and Norvasc 2.5 mg daily.  2. Obesity (BMI 30.0-34.9) Discussed and encourage healthy eating habits.  Challenged her to cut back to no more than 1 soda a day. Encouraged her to continue to move once she is finished with physical therapy next week.  She and her husband plan to join the gym.  3. PAD (peripheral artery disease) ([place]) Doing well postsurgery.  I think she can stop the Pletal.  I told her she should also run it by Dr. Scot Dock  4. Influenza  vaccination declined   5. Pneumococcal vaccination declined   6. COVID-19 vaccine series declined   7. Encounter for screening mammogram for malignant neoplasm of breast - MM Digital Screening; Future  8.  Postoperative anemia Check CBC today to see whether hemoglobin has improved.  We will also check iron studies.  Patient was given the opportunity to ask questions.  Patient verbalized understanding of the plan and was able to repeat key elements of the plan.   This documentation was completed using Radio producer.  Any transcriptional errors are unintentional.  No orders of the defined types were placed in this encounter.    Requested Prescriptions    No prescriptions requested or ordered in this encounter    No  follow-ups on file.  Karle Plumber, MD, FACP

## 2021-12-12 NOTE — Progress Notes (Signed)
Pt wants to discuss evaluated Bp   Pt states she wants to discuss Bp meds

## 2021-12-12 NOTE — Patient Instructions (Signed)
I think we can stop the Pletal but I would also call and ask Dr. Scot Dock about it.  Try to cut back to drinking no more than 1 soda a day.  Healthy Eating Following a healthy eating pattern may help you to achieve and maintain a healthy body weight, reduce the risk of chronic disease, and live a long and productive life. It is important to follow a healthy eating pattern at an appropriate calorie level for your body. Your nutritional needs should be met primarily through food by choosing a variety of nutrient-rich foods. What are tips for following this plan? Reading food labels Read labels and choose the following: Reduced or low sodium. Juices with 100% fruit juice. Foods with low saturated fats and high polyunsaturated and monounsaturated fats. Foods with whole grains, such as whole wheat, cracked wheat, brown rice, and wild rice. Whole grains that are fortified with folic acid. This is recommended for women who are pregnant or who want to become pregnant. Read labels and avoid the following: Foods with a lot of added sugars. These include foods that contain brown sugar, corn sweetener, corn syrup, dextrose, fructose, glucose, high-fructose corn syrup, honey, invert sugar, lactose, malt syrup, maltose, molasses, raw sugar, sucrose, trehalose, or turbinado sugar. Do not eat more than the following amounts of added sugar per day: 6 teaspoons (25 g) for women. 9 teaspoons (38 g) for men. Foods that contain processed or refined starches and grains. Refined grain products, such as white flour, degermed cornmeal, white bread, and white rice. Shopping Choose nutrient-rich snacks, such as vegetables, whole fruits, and nuts. Avoid high-calorie and high-sugar snacks, such as potato chips, fruit snacks, and candy. Use oil-based dressings and spreads on foods instead of solid fats such as butter, stick margarine, or cream cheese. Limit pre-made sauces, mixes, and "instant" products such as flavored  rice, instant noodles, and ready-made pasta. Try more plant-protein sources, such as tofu, tempeh, black beans, edamame, lentils, nuts, and seeds. Explore eating plans such as the Mediterranean diet or vegetarian diet. Cooking Use oil to saut or stir-fry foods instead of solid fats such as butter, stick margarine, or lard. Try baking, boiling, grilling, or broiling instead of frying. Remove the fatty part of meats before cooking. Steam vegetables in water or broth. Meal planning  At meals, imagine dividing your plate into fourths: One-half of your plate is fruits and vegetables. One-fourth of your plate is whole grains. One-fourth of your plate is protein, especially lean meats, poultry, eggs, tofu, beans, or nuts. Include low-fat dairy as part of your daily diet. Lifestyle Choose healthy options in all settings, including home, work, school, restaurants, or stores. Prepare your food safely: Wash your hands after handling raw meats. Keep food preparation surfaces clean by regularly washing with hot, soapy water. Keep raw meats separate from ready-to-eat foods, such as fruits and vegetables. Cook seafood, meat, poultry, and eggs to the recommended internal temperature. Store foods at safe temperatures. In general: Keep cold foods at 44F (4.4C) or below. Keep hot foods at 144F (60C) or above. Keep your freezer at [facility] (-17.8C) or below. Foods are no longer safe to eat when they have been between the temperatures of 40-144F (4.4-60C) for more than 2 hours. What foods should I eat? Fruits Aim to eat 2 cup-equivalents of fresh, canned (in natural juice), or frozen fruits each day. Examples of 1 cup-equivalent of fruit include 1 small apple, 8 large strawberries, 1 cup canned fruit,  cup dried fruit, or 1 cup  100% juice. Vegetables Aim to eat 2-3 cup-equivalents of fresh and frozen vegetables each day, including different varieties and colors. Examples of 1 cup-equivalent of  vegetables include 2 medium carrots, 2 cups raw, leafy greens, 1 cup chopped vegetable (raw or cooked), or 1 medium baked potato. Grains Aim to eat 6 ounce-equivalents of whole grains each day. Examples of 1 ounce-equivalent of grains include 1 slice of bread, 1 cup ready-to-eat cereal, 3 cups popcorn, or  cup cooked rice, pasta, or cereal. Meats and other proteins Aim to eat 5-6 ounce-equivalents of protein each day. Examples of 1 ounce-equivalent of protein include 1 egg, 1/2 cup nuts or seeds, or 1 tablespoon (16 g) peanut butter. A cut of meat or fish that is the size of a deck of cards is about 3-4 ounce-equivalents. Of the protein you eat each week, try to have at least 8 ounces come from seafood. This includes salmon, trout, herring, and anchovies. Dairy Aim to eat 3 cup-equivalents of fat-free or low-fat dairy each day. Examples of 1 cup-equivalent of dairy include 1 cup (240 mL) milk, 8 ounces (250 g) yogurt, 1 ounces (44 g) natural cheese, or 1 cup (240 mL) fortified soy milk. Fats and oils Aim for about 5 teaspoons (21 g) per day. Choose monounsaturated fats, such as canola and olive oils, avocados, peanut butter, and most nuts, or polyunsaturated fats, such as sunflower, corn, and soybean oils, walnuts, pine nuts, sesame seeds, sunflower seeds, and flaxseed. Beverages Aim for six 8-oz glasses of water per day. Limit coffee to three to five 8-oz cups per day. Limit caffeinated beverages that have added calories, such as soda and energy drinks. Limit alcohol intake to no more than 1 drink a day for nonpregnant women and 2 drinks a day for men. One drink equals 12 oz of beer (355 mL), 5 oz of wine (148 mL), or 1 oz of hard liquor (44 mL). Seasoning and other foods Avoid adding excess amounts of salt to your foods. Try flavoring foods with herbs and spices instead of salt. Avoid adding sugar to foods. Try using oil-based dressings, sauces, and spreads instead of solid fats. This  information is based on general [place] nutrition guidelines. For more information, visit [employer]. Exact amounts may vary based on your nutrition needs. Summary A healthy eating plan may help you to maintain a healthy weight, reduce the risk of chronic diseases, and stay active throughout your life. Plan your meals. Make sure you eat the right portions of a variety of nutrient-rich foods. Try baking, boiling, grilling, or broiling instead of frying. Choose healthy options in all settings, including home, work, school, restaurants, or stores. This information is not intended to replace advice given to you by your health care provider. Make sure you discuss any questions you have with your health care provider. Document Revised: 10/16/2020 Document Reviewed: 10/16/2020 Elsevier Patient Education  [place].

## 2021-12-13 LAB — CBC
Hematocrit: 34.4 % (ref 34.0–46.6)
Hemoglobin: 11.3 g/dL (ref 11.1–15.9)
MCH: 31.7 pg (ref 26.6–33.0)
MCHC: 32.8 g/dL (ref 31.5–35.7)
MCV: 96 fL (ref 79–97)
Platelets: 229 10*3/uL (ref 150–450)
RBC: 3.57 x10E6/uL — ABNORMAL LOW (ref 3.77–5.28)
RDW: 12.7 % (ref 11.7–15.4)
WBC: 6.1 10*3/uL (ref 3.4–10.8)

## 2021-12-13 LAB — IRON,TIBC AND FERRITIN PANEL
Ferritin: 105 ng/mL (ref 15–150)
Iron Saturation: 7 % — CL (ref 15–55)
Iron: 20 ug/dL — ABNORMAL LOW (ref 27–139)
Total Iron Binding Capacity: 267 ug/dL (ref 250–450)
UIBC: 247 ug/dL (ref 118–369)

## 2021-12-20 ENCOUNTER — Other Ambulatory Visit: Payer: Self-pay | Admitting: Internal Medicine

## 2021-12-20 ENCOUNTER — Other Ambulatory Visit: Payer: Self-pay

## 2021-12-20 MED ORDER — NYSTATIN 100000 UNIT/ML MT SUSP
5.0000 mL | Freq: Four times a day (QID) | OROMUCOSAL | 0 refills | Status: DC
  Filled 2021-12-20: qty 100, 5d supply, fill #0

## 2021-12-24 DIAGNOSIS — J449 Chronic obstructive pulmonary disease, unspecified: Secondary | ICD-10-CM | POA: Diagnosis not present

## 2021-12-24 DIAGNOSIS — G8929 Other chronic pain: Secondary | ICD-10-CM | POA: Diagnosis not present

## 2021-12-24 DIAGNOSIS — I739 Peripheral vascular disease, unspecified: Secondary | ICD-10-CM | POA: Diagnosis not present

## 2021-12-24 DIAGNOSIS — M545 Low back pain, unspecified: Secondary | ICD-10-CM | POA: Diagnosis not present

## 2021-12-24 DIAGNOSIS — Z48812 Encounter for surgical aftercare following surgery on the circulatory system: Secondary | ICD-10-CM | POA: Diagnosis not present

## 2021-12-24 DIAGNOSIS — M5412 Radiculopathy, cervical region: Secondary | ICD-10-CM | POA: Diagnosis not present

## 2021-12-24 DIAGNOSIS — M19011 Primary osteoarthritis, right shoulder: Secondary | ICD-10-CM | POA: Diagnosis not present

## 2021-12-24 DIAGNOSIS — R0789 Other chest pain: Secondary | ICD-10-CM | POA: Diagnosis not present

## 2021-12-24 DIAGNOSIS — E785 Hyperlipidemia, unspecified: Secondary | ICD-10-CM | POA: Diagnosis not present

## 2021-12-24 DIAGNOSIS — I10 Essential (primary) hypertension: Secondary | ICD-10-CM | POA: Diagnosis not present

## 2021-12-25 ENCOUNTER — Other Ambulatory Visit: Payer: Self-pay

## 2022-01-02 ENCOUNTER — Telehealth: Payer: Self-pay

## 2022-01-02 NOTE — Telephone Encounter (Signed)
Pt called inquiring about whether she had "a tube in her" from the surgery or not.  Reviewed pt's chart, returned call for clarification, two identifiers used. Informed her that a PTFE graft was used in her surgery. She denies any issues, but states that she sometimes feels it when she rubs her hands on her side. Reassured pt and instructed her to call if she had any issues. Confirmed understanding.

## 2022-01-06 ENCOUNTER — Other Ambulatory Visit: Payer: Self-pay

## 2022-01-06 ENCOUNTER — Other Ambulatory Visit: Payer: Self-pay | Admitting: Internal Medicine

## 2022-01-06 DIAGNOSIS — E875 Hyperkalemia: Secondary | ICD-10-CM

## 2022-01-07 ENCOUNTER — Other Ambulatory Visit: Payer: Self-pay

## 2022-01-07 MED ORDER — POTASSIUM CHLORIDE ER 10 MEQ PO TBCR
20.0000 meq | EXTENDED_RELEASE_TABLET | Freq: Every day | ORAL | 2 refills | Status: DC
  Filled 2022-01-07: qty 60, 30d supply, fill #0
  Filled 2022-02-06: qty 60, 30d supply, fill #1
  Filled 2022-03-12: qty 60, 30d supply, fill #2

## 2022-01-10 ENCOUNTER — Other Ambulatory Visit: Payer: Self-pay

## 2022-01-10 ENCOUNTER — Ambulatory Visit: Payer: Medicare Other | Attending: Internal Medicine

## 2022-01-10 DIAGNOSIS — E2839 Other primary ovarian failure: Secondary | ICD-10-CM

## 2022-01-10 DIAGNOSIS — Z0000 Encounter for general adult medical examination without abnormal findings: Secondary | ICD-10-CM

## 2022-01-10 NOTE — Patient Instructions (Signed)
Brandy Stevenson , Thank you for taking time to come for your Medicare Wellness Visit. I appreciate your ongoing commitment to your health goals. Please review the following plan we discussed and let me know if I can assist you in the future.   These are the goals we discussed:  Goals       Patient Stated     Blood Pressure < 140/90 (pt-stated)      "To get my blood pressure regulated as much as possible."      Other     Quit smoking / using tobacco        This is a list of the screening recommended for you and due dates:  Health Maintenance  Topic Date Due   Mammogram  08/20/2021   DEXA scan (bone density measurement)  10/19/2021   Medicare Annual Wellness Visit  10/23/2021   Pap Smear  01/20/2022   Flu Shot  06/01/2022*   Zoster (Shingles) Vaccine (1 of 2) 12/12/2022*   Pneumonia Vaccine (1 - PCV) 12/13/2022*   COVID-19 Vaccine (1) 12/29/2022*   Colon Cancer Screening  04/11/2022   Tetanus Vaccine  04/09/2023   Hepatitis C Screening: USPSTF Recommendation to screen - Ages 18-79 yo.  Completed   HIV Screening  Completed   HPV Vaccine  Aged Out  *Topic was postponed. The date shown is not the original due date.  Health Maintenance, Female Adopting a healthy lifestyle and getting preventive care are important in promoting health and wellness. Ask your health care provider about: The right schedule for you to have regular tests and exams. Things you can do on your own to prevent diseases and keep yourself healthy. What should I know about diet, weight, and exercise? Eat a healthy diet  Eat a diet that includes plenty of vegetables, fruits, low-fat dairy products, and lean protein. Do not eat a lot of foods that are high in solid fats, added sugars, or sodium. Maintain a healthy weight Body mass index (BMI) is used to identify weight problems. It estimates body fat based on height and weight. Your health care provider can help determine your BMI and help you achieve or maintain  a healthy weight. Get regular exercise Get regular exercise. This is one of the most important things you can do for your health. Most adults should: Exercise for at least 150 minutes each week. The exercise should increase your heart rate and make you sweat (moderate-intensity exercise). Do strengthening exercises at least twice a week. This is in addition to the moderate-intensity exercise. Spend less time sitting. Even light physical activity can be beneficial. Watch cholesterol and blood lipids Have your blood tested for lipids and cholesterol at 65 years of age, then have this test every 5 years. Have your cholesterol levels checked more often if: Your lipid or cholesterol levels are high. You are older than 65 years of age. You are at high risk for heart disease. What should I know about cancer screening? Depending on your health history and family history, you may need to have cancer screening at various ages. This may include screening for: Breast cancer. Cervical cancer. Colorectal cancer. Skin cancer. Lung cancer. What should I know about heart disease, diabetes, and high blood pressure? Blood pressure and heart disease High blood pressure causes heart disease and increases the risk of stroke. This is more likely to develop in people who have high blood pressure readings or are overweight. Have your blood pressure checked: Every 3-5 years if you are  74-73 years of age. Every year if you are 47 years old or older. Diabetes Have regular diabetes screenings. This checks your fasting blood sugar level. Have the screening done: Once every three years after age 45 if you are at a normal weight and have a low risk for diabetes. More often and at a younger age if you are overweight or have a high risk for diabetes. What should I know about preventing infection? Hepatitis B If you have a higher risk for hepatitis B, you should be screened for this virus. Talk with your health care  provider to find out if you are at risk for hepatitis B infection. Hepatitis C Testing is recommended for: Everyone born from [birth year] through 1964. Anyone with known risk factors for hepatitis C. Sexually transmitted infections (STIs) Get screened for STIs, including gonorrhea and chlamydia, if: You are sexually active and are younger than 65 years of age. You are older than 65 years of age and your health care provider tells you that you are at risk for this type of infection. Your sexual activity has changed since you were last screened, and you are at increased risk for chlamydia or gonorrhea. Ask your health care provider if you are at risk. Ask your health care provider about whether you are at high risk for HIV. Your health care provider may recommend a prescription medicine to help prevent HIV infection. If you choose to take medicine to prevent HIV, you should first get tested for HIV. You should then be tested every 3 months for as long as you are taking the medicine. Pregnancy If you are about to stop having your period (premenopausal) and you may become pregnant, seek counseling before you get pregnant. Take 400 to 800 micrograms (mcg) of folic acid every day if you become pregnant. Ask for birth control (contraception) if you want to prevent pregnancy. Osteoporosis and menopause Osteoporosis is a disease in which the bones lose minerals and strength with aging. This can result in bone fractures. If you are 68 years old or older, or if you are at risk for osteoporosis and fractures, ask your health care provider if you should: Be screened for bone loss. Take a calcium or vitamin D supplement to lower your risk of fractures. Be given hormone replacement therapy (HRT) to treat symptoms of menopause. Follow these instructions at home: Alcohol use Do not drink alcohol if: Your health care provider tells you not to drink. You are pregnant, may be pregnant, or are planning to become  pregnant. If you drink alcohol: Limit how much you have to: 0-1 drink a day. Know how much alcohol is in your drink. In the [place] one drink equals one 12 oz bottle of beer (355 mL), one 5 oz glass of wine (148 mL), or one 1 oz glass of hard liquor (44 mL). Lifestyle Do not use any products that contain nicotine or tobacco. These products include cigarettes, chewing tobacco, and vaping devices, such as e-cigarettes. If you need help quitting, ask your health care provider. Do not use street drugs. Do not share needles. Ask your health care provider for help if you need support or information about quitting drugs. General instructions Schedule regular health, dental, and eye exams. Stay current with your vaccines. Tell your health care provider if: You often feel depressed. You have ever been abused or do not feel safe at home. Summary Adopting a healthy lifestyle and getting preventive care are important in promoting health and wellness. Follow your  health care provider's instructions about healthy diet, exercising, and getting tested or screened for diseases. Follow your health care provider's instructions on monitoring your cholesterol and blood pressure. This information is not intended to replace advice given to you by your health care provider. Make sure you discuss any questions you have with your health care provider. Document Revised: 07/09/2020 Document Reviewed: 07/09/2020 Elsevier Patient Education  [place].

## 2022-01-10 NOTE — Progress Notes (Signed)
Subjective:   Brandy Stevenson is a 66 y.o. female who presents for Medicare Annual (Subsequent) preventive examination.  Review of Systems     I connected with Brandy Stevenson on 01/10/2022 at 9:45 am by telephone and verified that I am speaking with the correct person using two identifiers. I discussed the limitations, risks, security and privacy concerns of performing an evaluation and management service by telephone and the availability of in person appointments. I also discussed with the patient that there may be a patient responsible charge related to this service. The patient expressed understanding and agreed to proceed.   Patient location: Home My Location: [place] on the telephone call: Myself and [name]     Cardiac Risk Factors include: none     Objective:    There were no vitals filed for this visit. There is no height or weight on file to calculate BMI.     01/10/2022    9:47 AM 10/29/2021    5:59 AM 10/22/2021    9:20 AM 08/07/2021    8:39 AM 10/23/2020   10:11 AM 10/05/2019    8:46 AM 08/30/2019   10:18 PM  Advanced Directives  Does Patient Have a Medical Advance Directive? No No No No No No No  Would patient like information on creating a medical advance directive? Yes (ED - Information included in AVS) No - Patient declined No - Patient declined  No - Patient declined No - Patient declined Yes (MAU/Ambulatory/Procedural Areas - Information given)    Current Medications (verified) Outpatient Encounter Medications as of 01/10/2022  Medication Sig   acetaminophen (TYLENOL) 500 MG tablet Take 1,000 mg by mouth every 8 (eight) hours.   albuterol (PROAIR HFA) 108 (90 Base) MCG/ACT inhaler Inhale 2 puffs into the lungs every 6 (six) hours as needed for wheezing or shortness of breath.   aspirin EC 81 MG tablet Take 1 tablet (81 mg total) by mouth daily.   atorvastatin (LIPITOR) 80 MG tablet Take 1 tablet (80 mg total) by mouth at  bedtime.   carvedilol (COREG) 6.25 MG tablet TAKE 1 TABLET (6.25 MG TOTAL) BY MOUTH 2 (TWO) TIMES DAILY.   cilostazol (PLETAL) 50 MG tablet TAKE 1 TABLET BY MOUTH TWICE  DAILY (Patient taking differently: Take 50 mg by mouth daily.)   fluticasone-salmeterol (ADVAIR) 250-50 MCG/ACT AEPB Inhale 1 puff into the lungs in the morning and at bedtime.   gabapentin (NEURONTIN) 300 MG capsule TAKE 1 CAPSULE BY MOUTH AT  BEDTIME   hydrALAZINE (APRESOLINE) 25 MG tablet Take 1 tablet (25 mg total) by mouth 3 (three) times daily. (Patient taking differently: Take 25 mg by mouth See admin instructions. Take 25 mg twice daily, may take a third 25 mg dose as needed for high blood pressure)   meloxicam (MOBIC) 15 MG tablet TAKE 1 TABLET BY MOUTH DAILY   omeprazole (PRILOSEC) 40 MG capsule TAKE 1 CAPSULE BY MOUTH DAILY   potassium chloride (KLOR-CON) 10 MEQ tablet Take 2 tablets (20 mEq total) by mouth daily.   spironolactone (ALDACTONE) 25 MG tablet Take 1 tablet (25 mg total) by mouth daily.   amLODipine (NORVASC) 2.5 MG tablet Take 2.5 mg by mouth daily. (Patient not taking: Reported on 01/10/2022)   Menthol, Topical Analgesic, (BIOFREEZE) 5 % PTCH Apply 0.5 patches topically daily as needed (pain). (Patient not taking: Reported on 12/12/2021)   nystatin (MYCOSTATIN) 100000 UNIT/ML suspension SWISH AND SWALLOW 5 MLS (500,000 UNITS TOTAL) BY MOUTH 4 (FOUR)  TIMES DAILY. (Patient not taking: Reported on 01/10/2022)   oxyCODONE-acetaminophen (PERCOCET/ROXICET) 5-325 MG tablet Take 1 tablet by mouth every 6 (six) hours as needed for moderate pain. (Patient not taking: Reported on 12/12/2021)   No facility-administered encounter medications on file as of 01/10/2022.    Allergies (verified) Vicodin [hydrocodone-acetaminophen], Bactrim [sulfamethoxazole-trimethoprim], Incruse ellipta [umeclidinium bromide], Tramadol, and Wellbutrin [bupropion]   History: Past Medical History:  Diagnosis Date   Arthritis    back  problems to be evaluated by neurology next month   Cancer ([place]) 2009   L, Breast, radiation and surgery.   COPD (chronic obstructive pulmonary disease) (HCC)    Depression    GERD (gastroesophageal reflux disease)    Hyperlipemia    Hypertension    Leriche syndrome (HCC)    Infrarenal aortic occlusion extending to bilateral internal iliac arteries.  External iliacs fill via collaterals from lumbar arteries to hypogastric/internal iliac arteries -minimal disease on the right side and no disease on the left side leg arteries distally.   Peripheral vascular disease ([place]) 2013   Pre-diabetes    Past Surgical History:  Procedure Laterality Date   APPENDECTOMY  1980   AXILLARY-FEMORAL BYPASS GRAFT Bilateral 10/29/2021   Procedure: LEFT AXILLOBIFEMORAL BYPASS GRAFT;  Surgeon: [name], MD;  Location: [facility];  Service: Vascular;  Laterality: Bilateral;   BREAST LUMPECTOMY Left    s/p radiation therapy    BREAST SURGERY     COLONOSCOPY  07/2015   COLONOSCOPY WITH PROPOFOL N/A 06/26/2015   Procedure: COLONOSCOPY WITH PROPOFOL;  Surgeon: [name], MD;  Location: [facility];  Service: Endoscopy;  Laterality: N/A;   CT CTA CORONARY W/CA SCORE W/CM &/OR WO/CM  12/10/2017   Coronary calcium score is 0.  No evidence of CAD.   PERIPHERAL VASCULAR CATHETERIZATION N/A 10/22/2015   Procedure: Abdominal Aortogram w/Lower Extremity;  Surgeon: [name], MD;  Location: [facility]: Aortic arch widely patent as far as innominate artery, right subclavian artery, right vertebral artery and right, common carotid.  Bilateral single renal arteries - Nonocclusive disease. Infrarenal aorta 100% - bilateral common Iliac 100% --> B Ex Iliacs fill via Int Iliac. Leg OK   TRANSTHORACIC ECHOCARDIOGRAM  08/2017   EF 60-65%. No RWMA.  Normal DF.  Normal valves.    TUBAL LIGATION  1991   Family History  Problem Relation Age of Onset   Hyperlipidemia Mother    Hypertension Mother     Heart disease Mother        Atrial Fib.   Hyperlipidemia Sister    Hypertension Sister    Heart disease Sister        Before age 47,  CHF   Heart attack Brother    Hyperlipidemia Brother    Hypertension Brother    Heart disease Brother    Hypertension Daughter    Social History   Socioeconomic History   Marital status: Married    Spouse name: Not on file   Number of children: Not on file   Years of education: Not on file   Highest education level: Not on file  Occupational History   Not on file  Tobacco Use   Smoking status: Former    Packs/day: 0.25    Years: 40.00    Total pack years: 10.00    Types: Cigarettes    Quit date: 02/08/2017    Years since quitting: 4.9    Passive exposure: Never   Smokeless tobacco: Never  Vaping Use  Vaping Use: Never used  Substance and Sexual Activity   Alcohol use: No   Drug use: No   Sexual activity: Yes  Other Topics Concern   Not on file  Social History Narrative   Not on file   Social Determinants of Health   Financial Resource Strain: Low Risk  (01/10/2022)   Overall Financial Resource Strain (CARDIA)    Difficulty of Paying Living Expenses: Not hard at all  Food Insecurity: No Food Insecurity (01/10/2022)   Hunger Vital Sign    Worried About Running Out of Food in the Last Year: Never true    Ran Out of Food in the Last Year: Never true  Transportation Needs: No Transportation Needs (01/10/2022)   PRAPARE - Hydrologist (Medical): No    Lack of Transportation (Non-Medical): No  Physical Activity: Insufficiently Active (01/10/2022)   Exercise Vital Sign    Days of Exercise per Week: 2 days    Minutes of Exercise per Session: 10 min  Stress: No Stress Concern Present (01/10/2022)   [place]    Feeling of Stress : Not at all  Social Connections: [place] (01/10/2022)   Social Connection and Isolation Panel  [NHANES]    Frequency of Communication with Friends and Family: More than three times a week    Frequency of Social Gatherings with Friends and Family: Three times a week    Attends Religious Services: More than 4 times per year    Active Member of Clubs or Organizations: Yes    Attends Music therapist: More than 4 times per year    Marital Status: Married    Tobacco Counseling Counseling given: No   Clinical Intake:  Pre-visit preparation completed: No  Pain : No/denies pain     Nutritional Risks: None Diabetes: No  How often do you need to have someone help you when you read instructions, pamphlets, or other written materials from your doctor or pharmacy?: 1 - Never  Diabetic?No  Interpreter Needed?: No      Activities of Daily Living    01/10/2022    9:52 AM 10/29/2021   11:00 PM  In your present state of health, do you have any difficulty performing the following activities:  Hearing? 0 0  Vision? 0 0  Difficulty concentrating or making decisions? 0 0  Walking or climbing stairs? 1 1  Dressing or bathing? 0 0  Doing errands, shopping? 0 0  Preparing Food and eating ? N   Using the Toilet? N   In the past six months, have you accidently leaked urine? N   Do you have problems with loss of bowel control? N   Managing your Medications? N   Managing your Finances? N   Housekeeping or managing your Housekeeping? N     Patient Care Team: [name], MD as PCP - General (Internal Medicine) [name], MD as PCP - Cardiology (Cardiology)  Indicate any recent Medical Services you may have received from other than [facility] providers in the past year (date may be approximate).     Assessment:   This is a routine wellness examination for [place].  Hearing/Vision screen No results found.  Dietary issues and exercise activities discussed: Current Exercise Habits: Home exercise routine, Time (Minutes): 10, Frequency (Times/Week): 2,  Weekly Exercise (Minutes/Week): 20, Intensity: Mild, Exercise limited by: None identified   Goals Addressed   None    Depression  Screen    01/10/2022    9:47 AM 06/14/2021    2:15 PM 02/12/2021    9:15 AM 10/23/2020   10:10 AM 06/14/2020    9:07 AM 02/14/2020    2:17 PM 02/03/2020   10:49 AM  PHQ 2/9 Scores  PHQ - 2 Score 0 '1 1 2 1 1 1  '$ PHQ- 9 Score   5 7       Fall Risk    01/10/2022    9:47 AM 12/12/2021    1:52 PM 06/14/2021    2:15 PM 02/12/2021    9:07 AM 10/23/2020   10:10 AM  Fall Risk   Falls in the past year? 0 0 0 0 0  Number falls in past yr: 0 0 0  0  Injury with Fall? 0 0 0  0  Risk for fall due to : No Fall Risks No Fall Risks No Fall Risks  No Fall Risks  Follow up  Falls evaluation completed       FALL RISK PREVENTION PERTAINING TO THE HOME:  Any stairs in or around the home? Yes  If so, are there any without handrails? Yes  Home free of loose throw rugs in walkways, pet beds, electrical cords, etc? No  Adequate lighting in your home to reduce risk of falls? Yes   ASSISTIVE DEVICES UTILIZED TO PREVENT FALLS:  Life alert? No  Use of a cane, walker or w/c? No  Grab bars in the bathroom? No  Shower chair or bench in shower? No  Elevated toilet seat or a handicapped toilet? Yes   TIMED UP AND GO:  Was the test performed? No .  Length of time to ambulate 10 feet: N/A sec.   Gait slow and steady without use of assistive device  Cognitive Function:    01/10/2022    9:48 AM 10/23/2020   10:09 AM  MMSE - Mini Mental State Exam  Orientation to time 5 5  Orientation to Place 5 5  Registration 3 3  Attention/ Calculation 5 5  Recall 3 3  Language- name 2 objects 2 2  Language- repeat 1 1  Language- follow 3 step command 3 3  Language- read & follow direction 1 1  Write a sentence 1 1  Copy design 1 0  Total score 30 29        01/10/2022    9:48 AM 08/30/2019   10:43 AM  6CIT Screen  What Year? 0 points 0 points  What month? 0 points 0  points  What time? 0 points 0 points  Count back from 20 0 points 0 points  Months in reverse 0 points 0 points  Repeat phrase 0 points 0 points  Total Score 0 points 0 points    Immunizations  There is no immunization history on file for this patient.  TDAP status: Due, Education has been provided regarding the importance of this vaccine. Advised may receive this vaccine at local pharmacy or Health Dept. Aware to provide a copy of the vaccination record if obtained from local pharmacy or Health Dept. Verbalized acceptance and understanding. DECLINED  Flu Vaccine status: Declined, Education has been provided regarding the importance of this vaccine but patient still declined. Advised may receive this vaccine at local pharmacy or Health Dept. Aware to provide a copy of the vaccination record if obtained from local pharmacy or Health Dept. Verbalized acceptance and understanding.  Pneumococcal vaccine status: Declined,  Education has been provided regarding the importance  of this vaccine but patient still declined. Advised may receive this vaccine at local pharmacy or Health Dept. Aware to provide a copy of the vaccination record if obtained from local pharmacy or Health Dept. Verbalized acceptance and understanding.   Covid-19 vaccine status: Declined, Education has been provided regarding the importance of this vaccine but patient still declined. Advised may receive this vaccine at local pharmacy or Health Dept.or vaccine clinic. Aware to provide a copy of the vaccination record if obtained from local pharmacy or Health Dept. Verbalized acceptance and understanding.  Qualifies for Shingles Vaccine? Yes   Zostavax completed No   Shingrix Completed?: No.    Education has been provided regarding the importance of this vaccine. Patient has been advised to call insurance company to determine out of pocket expense if they have not yet received this vaccine. Advised may also receive vaccine at local  pharmacy or Health Dept. Verbalized acceptance and understanding.  Screening Tests Health Maintenance  Topic Date Due   MAMMOGRAM  08/20/2021   DEXA SCAN  10/19/2021   Medicare Annual Wellness (AWV)  10/23/2021   PAP SMEAR-Modifier  01/20/2022   INFLUENZA VACCINE  06/01/2022 (Originally 10/01/2021)   Zoster Vaccines- Shingrix (1 of 2) 12/12/2022 (Originally 10/20/2006)   Pneumonia Vaccine 60+ Years old (1 - PCV) 12/13/2022 (Originally 10/20/1961)   COVID-19 Vaccine (1) 12/29/2022 (Originally 04/21/1956)   COLONOSCOPY (Pts 45-39yr Insurance coverage will need to be confirmed)  04/11/2022   TETANUS/TDAP  04/09/2023   Hepatitis C Screening  Completed   HIV Screening  Completed   HPV VACCINES  Aged Out    Health Maintenance  Health Maintenance Due  Topic Date Due   MAMMOGRAM  08/20/2021   DEXA SCAN  10/19/2021   Medicare Annual Wellness (AWV)  10/23/2021   PAP SMEAR-Modifier  01/20/2022    Colorectal cancer screening: Type of screening: Colonoscopy. Completed 04/11/2019. Repeat every 3 years  Mammogram status: Ordered 12/12/2021. Pt provided with contact info and advised to call to schedule appt. 02/20/2022  Bone Density status: Ordered 01/10/2022. Pt provided with contact info and advised to call to schedule appt.  Lung Cancer Screening: (Low Dose CT Chest recommended if Age 65-80years, 30 pack-year currently smoking OR have quit w/in 15years.) does not qualify.   Lung Cancer Screening Referral: N/A  Additional Screening:  Hepatitis C Screening: does qualify; Completed 04/26/2021  Vision Screening: Recommended annual ophthalmology exams for early detection of glaucoma and other disorders of the eye. Is the patient up to date with their annual eye exam?  No  Who is the provider or what is the name of the office in which the patient attends annual eye exams? [facility] If pt is not established with a provider, would they like to be referred to a provider to establish care? No  .   Dental Screening: Recommended annual dental exams for proper oral hygiene  Community Resource Referral / Chronic Care Management: CRR required this visit?  No   CCM required this visit?  No      Plan:     I have personally reviewed and noted the following in the patient's chart:   Medical and social history Use of alcohol, tobacco or illicit drugs  Current medications and supplements including opioid prescriptions. Patient is not currently taking opioid prescriptions. Functional ability and status Nutritional status Physical activity Advanced directives List of other physicians Hospitalizations, surgeries, and ER visits in previous 12 months Vitals Screenings to include cognitive, depression, and falls Referrals  and appointments  In addition, I have reviewed and discussed with patient certain preventive protocols, quality metrics, and best practice recommendations. A written personalized care plan for preventive services as well as general preventive health recommendations were provided to patient.     Gomez Cleverly, [place]   01/10/2022   Nurse Notes: I spent 30 minutes on this telephone encounter AVS mailed to patinet

## 2022-01-22 ENCOUNTER — Other Ambulatory Visit: Payer: Self-pay

## 2022-01-27 ENCOUNTER — Other Ambulatory Visit: Payer: Self-pay

## 2022-01-30 ENCOUNTER — Telehealth: Payer: Self-pay

## 2022-01-30 DIAGNOSIS — M7989 Other specified soft tissue disorders: Secondary | ICD-10-CM

## 2022-01-30 DIAGNOSIS — I739 Peripheral vascular disease, unspecified: Secondary | ICD-10-CM

## 2022-01-30 NOTE — Telephone Encounter (Signed)
Pt called c/o pain in her thigh and calf. She was requesting an appt earlier than 6 mos and asked if she needed to continue on blood thinners or not.  Reviewed pt's chart, returned call for clarification, two identifiers used. Pt describes the pain as similar to what she was experiencing prior to surgery and is very concerned that there may be an issue with graft. She has swelling during the day, despite wearing compressions. She states the elevation she does during the night completely alleviates all swelling, but it comes back when up with activity. Instructed pt to properly elevate 3-4 times daily for 10-15 min like she does at HS. Appts scheduled from recall based on symptoms to include [place] studies. Confirmed understanding.

## 2022-02-02 ENCOUNTER — Other Ambulatory Visit: Payer: Self-pay | Admitting: Internal Medicine

## 2022-02-02 DIAGNOSIS — Z760 Encounter for issue of repeat prescription: Secondary | ICD-10-CM

## 2022-02-03 ENCOUNTER — Other Ambulatory Visit: Payer: Self-pay | Admitting: Internal Medicine

## 2022-02-03 DIAGNOSIS — Z760 Encounter for issue of repeat prescription: Secondary | ICD-10-CM

## 2022-02-03 NOTE — Telephone Encounter (Signed)
Requested Prescriptions  Pending Prescriptions Disp Refills   omeprazole (PRILOSEC) 40 MG capsule [Pharmacy Med Name: Omeprazole 40 MG Oral Capsule Delayed Release] 100 capsule 2    Sig: TAKE 1 CAPSULE BY MOUTH DAILY     Gastroenterology: Proton Pump Inhibitors Passed - 02/02/2022  7:50 AM      Passed - Valid encounter within last 12 months    Recent Outpatient Visits           1 month ago Essential hypertension   [place], MD   7 months ago Essential hypertension   [place], MD   11 months ago Essential hypertension   [place], Deborah B, MD   1 year ago Encounter for [employer] annual wellness exam   [place] [name], MD   1 year ago Essential hypertension   [place], MD       Future Appointments             In 2 months Wynetta Emery Dalbert Batman, MD [place]

## 2022-02-06 ENCOUNTER — Other Ambulatory Visit: Payer: Self-pay

## 2022-02-06 ENCOUNTER — Other Ambulatory Visit: Payer: Self-pay | Admitting: Internal Medicine

## 2022-02-06 MED ORDER — NYSTATIN 100000 UNIT/ML MT SUSP
5.0000 mL | Freq: Four times a day (QID) | OROMUCOSAL | 0 refills | Status: DC
  Filled 2022-02-06: qty 100, 5d supply, fill #0

## 2022-02-07 ENCOUNTER — Other Ambulatory Visit: Payer: Self-pay

## 2022-02-13 ENCOUNTER — Ambulatory Visit (HOSPITAL_COMMUNITY)
Admission: RE | Admit: 2022-02-13 | Discharge: 2022-02-13 | Disposition: A | Discharge status: Home / Self Care | Payer: Medicare Other | Source: Ambulatory Visit | Attending: Vascular Surgery | Admitting: Vascular Surgery

## 2022-02-13 ENCOUNTER — Encounter: Payer: Self-pay | Admitting: Vascular Surgery

## 2022-02-13 ENCOUNTER — Ambulatory Visit (INDEPENDENT_AMBULATORY_CARE_PROVIDER_SITE_OTHER)
Admission: RE | Admit: 2022-02-13 | Discharge: 2022-02-13 | Disposition: A | Discharge status: Home / Self Care | Payer: Medicare Other | Source: Ambulatory Visit | Attending: Vascular Surgery | Admitting: Vascular Surgery

## 2022-02-13 ENCOUNTER — Ambulatory Visit (INDEPENDENT_AMBULATORY_CARE_PROVIDER_SITE_OTHER): Payer: Medicare Other | Admitting: Vascular Surgery

## 2022-02-13 VITALS — BP 142/87 | HR 63 | Temp 97.9°F | Resp 20 | Ht 65.0 in | Wt 186.0 lb

## 2022-02-13 DIAGNOSIS — I739 Peripheral vascular disease, unspecified: Secondary | ICD-10-CM

## 2022-02-13 DIAGNOSIS — M7989 Other specified soft tissue disorders: Secondary | ICD-10-CM

## 2022-02-13 DIAGNOSIS — I7409 Other arterial embolism and thrombosis of abdominal aorta: Secondary | ICD-10-CM

## 2022-02-13 DIAGNOSIS — Z48812 Encounter for surgical aftercare following surgery on the circulatory system: Secondary | ICD-10-CM

## 2022-02-13 NOTE — Progress Notes (Signed)
REASON FOR VISIT:   Follow-up after left axillobifemoral bypass grafting.  MEDICAL ISSUES:   S/P LEFT AXILLOBIFEMORAL BYPASS GRAFT: The patient's left axillobifemoral bypass graft is patent.  She has biphasic Doppler signals in both feet with excellent perfusion of both feet.  She is not a smoker.  She is on aspirin and is on a statin.  She wanted to stop her Pletal which we had not started.  I told her that from my standpoint she does not need Pletal.  I have encouraged her to stay as active as possible.  I have ordered a follow-up graft duplex and ABIs in 6 months and I will see her back at that time.  She knows to call sooner if she has problems.  HPI:   Brandy Stevenson is a pleasant 66 y.o. female who I last saw on 11/21/2021.  She had presented with progressive ischemia and rest pain of the left foot.  She had a known aortic occlusion.  She had a markedly calcified aorta and was not felt to be a good candidate for aortofemoral bypass grafting.  She underwent an left axillobifemoral bypass on 10/29/2021.  Rest pain and claudication resolved after surgery.  At the time of her last visit her ABI on the right was 97%.  ABI on the left was 97%.  She was set up for a 57-monthfollow-up visit.  She wanted to be seen sooner because she was having some pain in her left leg.  She noted some pain along the anterior aspect of her left leg.  This was not in the calf muscle.  She does not have any claudication or rest pain.  The pain has resolved.  In addition, she could feel something along the lateral aspect of her chest and abdomen and had forgotten that she had a bypass graft there.  We have gone over this again.  Past Medical History:  Diagnosis Date   Arthritis    back problems to be evaluated by neurology next month   Cancer ([facility]   L, Breast, radiation and surgery.   COPD (chronic obstructive pulmonary disease) (HCC)    Depression    GERD (gastroesophageal reflux disease)     Hyperlipemia    Hypertension    Leriche syndrome (HCC)    Infrarenal aortic occlusion extending to bilateral internal iliac arteries.  External iliacs fill via collaterals from lumbar arteries to hypogastric/internal iliac arteries -minimal disease on the right side and no disease on the left side leg arteries distally.   Peripheral vascular disease ([facility] 2013   Pre-diabetes     Family History  Problem Relation Age of Onset   Hyperlipidemia Mother    Hypertension Mother    Heart disease Mother        Atrial Fib.   Hyperlipidemia Sister    Hypertension Sister    Heart disease Sister        Before age 65  CHF   Heart attack Brother    Hyperlipidemia Brother    Hypertension Brother    Heart disease Brother    Hypertension Daughter     SOCIAL HISTORY: Social History   Tobacco Use   Smoking status: Former    Packs/day: 0.25    Years: 40.00    Total pack years: 10.00    Types: Cigarettes    Quit date: 02/08/2017    Years since quitting: 5.0    Passive exposure: Never   Smokeless tobacco: Never  Substance Use Topics  Alcohol use: No    Allergies  Allergen Reactions   Vicodin [Hydrocodone-Acetaminophen] Nausea And Vomiting and Other (See Comments)    Sweating   Bactrim [Sulfamethoxazole-Trimethoprim] Nausea And Vomiting    HA    Incruse Ellipta [Umeclidinium Bromide] Other (See Comments)    Dry mouth   Tramadol Nausea And Vomiting   Wellbutrin [Bupropion] Other (See Comments)    Made me feel bad and sleepy    Current Outpatient Medications  Medication Sig Dispense Refill   acetaminophen (TYLENOL) 500 MG tablet Take 1,000 mg by mouth every 8 (eight) hours.     albuterol (PROAIR HFA) 108 (90 Base) MCG/ACT inhaler Inhale 2 puffs into the lungs every 6 (six) hours as needed for wheezing or shortness of breath. 8.5 g 5   amLODipine (NORVASC) 2.5 MG tablet Take 2.5 mg by mouth daily.     aspirin EC 81 MG tablet Take 1 tablet (81 mg total) by mouth daily. 90 tablet 3    atorvastatin (LIPITOR) 80 MG tablet Take 1 tablet (80 mg total) by mouth at bedtime. 30 tablet 3   carvedilol (COREG) 6.25 MG tablet TAKE 1 TABLET (6.25 MG TOTAL) BY MOUTH 2 (TWO) TIMES DAILY. 180 tablet 1   cilostazol (PLETAL) 50 MG tablet TAKE 1 TABLET BY MOUTH TWICE  DAILY (Patient taking differently: Take 50 mg by mouth daily.) 180 tablet 3   fluticasone-salmeterol (ADVAIR) 250-50 MCG/ACT AEPB Inhale 1 puff into the lungs in the morning and at bedtime. 60 each 6   gabapentin (NEURONTIN) 300 MG capsule TAKE 1 CAPSULE BY MOUTH AT  BEDTIME 100 capsule 2   hydrALAZINE (APRESOLINE) 25 MG tablet Take 1 tablet (25 mg total) by mouth 3 (three) times daily. (Patient taking differently: Take 25 mg by mouth See admin instructions. Take 25 mg twice daily, may take a third 25 mg dose as needed for high blood pressure) 180 tablet 6   meloxicam (MOBIC) 15 MG tablet TAKE 1 TABLET BY MOUTH DAILY 100 tablet 0   Menthol, Topical Analgesic, (BIOFREEZE) 5 % PTCH Apply 0.5 patches topically daily as needed (pain).     nystatin (MYCOSTATIN) 100000 UNIT/ML suspension SWISH AND SWALLOW 5 MLS (500,000 UNITS TOTAL) BY MOUTH 4 (FOUR) TIMES DAILY. 100 mL 0   omeprazole (PRILOSEC) 40 MG capsule TAKE 1 CAPSULE BY MOUTH DAILY 100 capsule 1   oxyCODONE-acetaminophen (PERCOCET/ROXICET) 5-325 MG tablet Take 1 tablet by mouth every 6 (six) hours as needed for moderate pain. 20 tablet 0   potassium chloride (KLOR-CON) 10 MEQ tablet Take 2 tablets (20 mEq total) by mouth daily. 60 tablet 2   spironolactone (ALDACTONE) 25 MG tablet Take 1 tablet (25 mg total) by mouth daily. 90 tablet 3   No current facility-administered medications for this visit.    REVIEW OF SYSTEMS:  '[X]'$  denotes positive finding, '[ ]'$  denotes negative finding Cardiac  Comments:  Chest pain or chest pressure:    Shortness of breath upon exertion:    Short of breath when lying flat:    Irregular heart rhythm:        Vascular    Pain in calf, thigh, or hip  brought on by ambulation:    Pain in feet at night that wakes you up from your sleep:     Blood clot in your veins:    Leg swelling:         Pulmonary    Oxygen at home:    Productive cough:     Wheezing:  Neurologic    Sudden weakness in arms or legs:     Sudden numbness in arms or legs:     Sudden onset of difficulty speaking or slurred speech:    Temporary loss of vision in one eye:     Problems with dizziness:         Gastrointestinal    Blood in stool:     Vomited blood:         Genitourinary    Burning when urinating:     Blood in urine:        Psychiatric    Major depression:         Hematologic    Bleeding problems:    Problems with blood clotting too easily:        Skin    Rashes or ulcers:        Constitutional    Fever or chills:     PHYSICAL EXAM:   Vitals:   02/13/22 1326  BP: (!) 142/87  Pulse: 63  Resp: 20  Temp: 97.9 F (36.6 C)  SpO2: 97%  Weight: 186 lb (84.4 kg)  Height: '5\' 5"'$  (1.651 m)    GENERAL: The patient is a well-nourished female, in no acute distress. The vital signs are documented above. CARDIAC: There is a regular rate and rhythm.  VASCULAR: I do not detect carotid bruits. Both feet are warm and well-perfused. She has a palpable right dorsalis pedis pulse and left posterior tibial pulse. Her incisions of all healed nicely. PULMONARY: There is good air exchange bilaterally without wheezing or rales. ABDOMEN: Soft and non-tender with normal pitched bowel sounds.  MUSCULOSKELETAL: There are no major deformities or cyanosis. NEUROLOGIC: No focal weakness or paresthesias are detected. SKIN: There are no ulcers or rashes noted. PSYCHIATRIC: The patient has a normal affect.  DATA:    GRAFT DUPLEX: I have independently interpreted the patient's graft duplex scan today.  The left axillofemoral bypass graft is widely patent.  The left to right femoral-femoral bypass graft is widely patent.  There are no areas of significant  stenosis noted in the graft.  ARTERIAL DOPPLER STUDY: I have independently interpreted the patient's arterial Doppler study today.  On the right side there is a biphasic dorsalis pedis and posterior tibial signal.  ABI is 100%.  Toe pressures 112 mmHg.  On the left side there is a biphasic dorsalis pedis and posterior tibial signal.  ABI is 82%.  Toe pressures 117 mmHg.  Deitra Mayo [facility] [phone]

## 2022-02-20 ENCOUNTER — Ambulatory Visit: Payer: Self-pay

## 2022-02-20 ENCOUNTER — Other Ambulatory Visit: Payer: Self-pay

## 2022-02-20 ENCOUNTER — Ambulatory Visit
Admission: RE | Admit: 2022-02-20 | Discharge: 2022-02-20 | Disposition: A | Discharge status: Home / Self Care | Payer: Medicare Other | Source: Ambulatory Visit | Attending: Internal Medicine | Admitting: Internal Medicine

## 2022-02-20 ENCOUNTER — Other Ambulatory Visit: Payer: Self-pay | Admitting: Internal Medicine

## 2022-02-20 DIAGNOSIS — Z1231 Encounter for screening mammogram for malignant neoplasm of breast: Secondary | ICD-10-CM | POA: Diagnosis not present

## 2022-02-20 DIAGNOSIS — J449 Chronic obstructive pulmonary disease, unspecified: Secondary | ICD-10-CM

## 2022-02-20 MED ORDER — ALBUTEROL SULFATE HFA 108 (90 BASE) MCG/ACT IN AERS
2.0000 | INHALATION_SPRAY | Freq: Four times a day (QID) | RESPIRATORY_TRACT | 3 refills | Status: DC | PRN
  Filled 2022-02-20: qty 8.5, 25d supply, fill #0
  Filled 2022-03-12: qty 6.7, 20d supply, fill #1
  Filled 2022-04-13: qty 6.7, 25d supply, fill #2
  Filled 2022-05-08: qty 8.5, 25d supply, fill #3
  Filled 2022-06-15: qty 8.5, 25d supply, fill #4

## 2022-02-20 MED ORDER — AMLODIPINE BESYLATE 2.5 MG PO TABS
2.5000 mg | ORAL_TABLET | Freq: Every day | ORAL | 1 refills | Status: DC
  Filled 2022-02-20 – 2022-02-28 (×2): qty 90, 90d supply, fill #0

## 2022-02-20 NOTE — Addendum Note (Signed)
Addended by: Karle Plumber B on: 02/20/2022 09:21 PM   Modules accepted: Orders

## 2022-02-20 NOTE — Telephone Encounter (Signed)
Routing to PCP for review.

## 2022-02-20 NOTE — Telephone Encounter (Signed)
  Chief Complaint: Medication question Symptoms: see note Frequency:  Pertinent Negatives: Patient denies  Disposition: '[]'$ ED /'[]'$ Urgent Care (no appt availability in office) / '[]'$ Appointment(In office/virtual)/ '[]'$  [place] [facility]/ '[]'$ Home Care/ '[]'$ Refused Recommended Disposition /'[]'$ [place] [facility]/ '[x]'$  Follow-up with PCP Additional Notes: Pt called about medications for HTN. Pt states that she was taken off of Amlodipine '10mg'$  after her sx. And was told to take Hydralazine 3 times daily.  While out of town,Pt had high BP and went to UC. UC put her back on Amlodipine 2.'5mg'$ , which pt has been taking 1x daily.   Pt is no longer taking Hydralazine 3 times a day. She is taking it 2 times a day, Once in the morning and once at night.  She take the Amlodipine 1x daily in the middle of the day.   PT would like to know if she should continue the amlodipine  - she is running out. And if so at what strength, 2.5 mg or 10 mg.   Pt states that she does not take her BP regularly. She knows when her BP is elevated d/t HA.  Please advise.     Summary: Med management.   Pt stated she was out of town; her BP has been elevated; she went to urgent care; and her medication was changed from amLODipine (NORVASC) 10 MG tablet to amLODipine (NORVASC) 2.5 MG tablet. Pt is asking if Dr.Johnson thinks she can go back to her normal amLODipine (NORVASC) 10 MG tablet   Pt seeking clinical advice.     Reason for Disposition  [1] Caller has NON-URGENT medicine question about med that PCP prescribed AND [2] triager unable to answer question  Answer Assessment - Initial Assessment Questions 1. NAME of MEDICINE: "What medicine(s) are you calling about?"     Amlodipine and hydrozyline 2. QUESTION: "What is your question?" (e.g., double dose of medicine, side effect)     Please see notes 3. PRESCRIBER: "Who prescribed the medicine?" Reason: if prescribed by specialist, call should be referred to that group.       4. SYMPTOMS: "Do you have any symptoms?" If Yes, ask: "What symptoms are you having?"  "How bad are the symptoms (e.g., mild, moderate, severe)      5. PREGNANCY:  "Is there any chance that you are pregnant?" "When was your last menstrual period?"  Protocols used: Medication Question Call-A-AH

## 2022-02-20 NOTE — Telephone Encounter (Signed)
Medication Refill - Medication: atorvastatin (LIPITOR) 80 MG tablet   Pt stated that after her surgery back in August, the Vascular and Vein Specialist changed her dose from 40 mg to 80 mg. Pt stated Dr.Johnson is aware and is who prescribed this medication in the first place, but after surgery, the dose was changed. Pt is requesting a refill.   Has the patient contacted their pharmacy? Yes.   No, more refills.   (Agent: If yes, when and what did the pharmacy advise?)  Preferred Pharmacy (with phone number or street name):  [facility], [place]  [place] [address] [place] [address]  Phone: [phone] Fax: [phone]  Hours: Not open 24 hours   Has the patient been seen for an appointment in the last year OR does the patient have an upcoming appointment? Yes.    Agent: Please be advised that RX refills may take up to 3 business days. We ask that you follow-up with your pharmacy.

## 2022-02-21 ENCOUNTER — Other Ambulatory Visit: Payer: Self-pay

## 2022-02-21 MED ORDER — ATORVASTATIN CALCIUM 80 MG PO TABS: 80.0000 mg | ORAL_TABLET | Freq: Every day | ORAL | 1 refills | Status: DC

## 2022-02-21 NOTE — Telephone Encounter (Signed)
Requested medication (s) are due for refill today: Yes  Requested medication (s) are on the active medication list: Yes  Last refill:  11/03/21  Future visit scheduled: Yes  Notes to clinic:  Last ordered by [facility]    Requested Prescriptions  Pending Prescriptions Disp Refills   atorvastatin (LIPITOR) 80 MG tablet 30 tablet 3    Sig: Take 1 tablet (80 mg total) by mouth at bedtime.     Cardiovascular:  Antilipid - Statins Failed - 02/20/2022 11:15 AM      Failed - Lipid Panel in normal range within the last 12 months    Cholesterol, Total  Date Value Ref Range Status  06/14/2021 150 100 - 199 mg/dL Final   Cholesterol  Date Value Ref Range Status  10/30/2021 117 0 - 200 mg/dL Final   LDL Chol Calc (NIH)  Date Value Ref Range Status  06/14/2021 65 0 - 99 mg/dL Final   LDL Cholesterol  Date Value Ref Range Status  10/30/2021 53 0 - 99 mg/dL Final    Comment:           Total Cholesterol/HDL:CHD Risk Coronary Heart Disease Risk Table                     Men   Women  1/2 Average Risk   3.4   3.3  Average Risk       5.0   4.4  2 X Average Risk   9.6   7.1  3 X Average Risk  23.4   11.0        Use the calculated Patient Ratio above and the CHD Risk Table to determine the patient's CHD Risk.        ATP III CLASSIFICATION (LDL):  <100     mg/dL   Optimal  100-129  mg/dL   Near or Above                    Optimal  130-159  mg/dL   Borderline  160-189  mg/dL   High  >190     mg/dL   Very High Performed at [facility] [address] [place], [place] [address]    HDL  Date Value Ref Range Status  10/30/2021 54 >40 mg/dL Final  06/14/2021 67 >39 mg/dL Final   Triglycerides  Date Value Ref Range Status  10/30/2021 49 <150 mg/dL Final         Passed - Patient is not pregnant      Passed - Valid encounter within last 12 months    Recent Outpatient Visits           2 months ago Essential hypertension   [place], MD   8 months ago Essential hypertension   [place], Deborah B, MD   1 year ago Essential hypertension   [place], Deborah B, MD   1 year ago Encounter for [employer] annual wellness exam   [place] [name], MD   1 year ago Essential hypertension   [place], MD       Future Appointments             In 1 month Wynetta Emery Dalbert Batman, MD [place]

## 2022-02-21 NOTE — Telephone Encounter (Signed)
Pt called and informed of PCP response and appointment given for BP check.

## 2022-02-25 ENCOUNTER — Other Ambulatory Visit: Payer: Self-pay

## 2022-02-25 DIAGNOSIS — I7409 Other arterial embolism and thrombosis of abdominal aorta: Secondary | ICD-10-CM

## 2022-02-25 DIAGNOSIS — I739 Peripheral vascular disease, unspecified: Secondary | ICD-10-CM

## 2022-02-28 ENCOUNTER — Other Ambulatory Visit: Payer: Self-pay

## 2022-03-13 ENCOUNTER — Other Ambulatory Visit: Payer: Self-pay

## 2022-03-26 ENCOUNTER — Other Ambulatory Visit: Payer: Self-pay | Admitting: Internal Medicine

## 2022-03-26 DIAGNOSIS — J449 Chronic obstructive pulmonary disease, unspecified: Secondary | ICD-10-CM

## 2022-03-26 NOTE — Progress Notes (Unsigned)
S:     PCP: Dr. Wynetta Emery  67 y.o. female who presents for hypertension evaluation, education, and management. PMH is significant for PAD, HTN, HL, former tob dep, LT breast CA (lumpectomy and XRT 2009), GERD, COPD, Trigeminal neuralgia RT, dep/anxiety, OA shoulders.  Patient was referred and last seen by Primary Care Provider, Dr. Wynetta Emery, on 12/12/2021.   Recent surgical procedure on 10/29/2021 for  axillobifemoral bypass graft due to rest pain in legs with hx of PAD. BP was low after the procedure leading to amlodipine '10mg'$  and lisinopril '40mg'$   discontinuation. She then presented to UC on 12/06/2021. BP was elevated so she was started on amlodipine 2.'5mg'$  once daily   Patient reached out to the office on 02/20/2022. She requested a medication refill and was inquiring what dose of amlodipine she should be taking. She was referred to the clinical pharmacist at that time for a BP check.   Today, patient arrives in good spirits and presents without assistance. Denies dizziness, headache, blurred vision, swelling.   Patient reports hypertension is longstanding.   Family/Social history:  Fhx: HLD, HTN, heart disease  Tobacco: former smoker (quit in 2018) Alcohol: none reported   Medication adherence reported. Patient has taken BP medications today.   Current antihypertensives include: amlodipine 2.'5mg'$  once daily, carvedilol 6.'25mg'$  BID, hydralazine '25mg'$  BID, spironolactone '25mg'$  once dialy   Antihypertensives tried in the past include: hctz (hypokalemia), linsinopril (low BP)  Reported home BP readings:  -138-176 mmHg -73-93 mmHg -Most are in the 140s-80s  Patient reported dietary habits:  Compliant with sodium restriction Denies excessive caffeine intake   Patient-reported exercise habits:  No formal exercise regimen  O:  Vitals:   03/27/22 1540  BP: 125/82   Last 3 Office BP readings: BP Readings from Last 3 Encounters:  03/27/22 125/82  02/13/22 (!) 142/87  12/12/21 113/74     BMET    Component Value Date/Time   NA 137 10/30/2021 0530   NA 141 08/20/2021 0929   K 3.7 10/30/2021 0530   CL 100 10/30/2021 0530   CO2 24 10/30/2021 0530   GLUCOSE 114 (H) 10/30/2021 0530   BUN 10 10/30/2021 0530   BUN 12 08/20/2021 0929   CREATININE 0.84 10/30/2021 0530   CREATININE 0.77 04/17/2016 1104   CALCIUM 9.0 10/30/2021 0530   GFRNONAA >60 10/30/2021 0530   GFRNONAA 85 04/17/2016 1104   GFRAA 93 04/28/2019 1222   GFRAA >89 04/17/2016 1104    Renal function: CrCl cannot be calculated (Patient's most recent lab result is older than the maximum 21 days allowed.).  Clinical ASCVD: Yes  The ASCVD Risk score (Arnett DK, et al., 2019) failed to calculate for the following reasons:   The valid total cholesterol range is 130 to 320 mg/dL  A/P: Hypertension diagnosed. Clinic BP at goal, however, home blood pressures are constently elevated. BP goal < 130/80 mmHg. Medication adherence appears appropriate, however, pt is very concerned about her home BP readings.  -Increased dose of amlodipine 5 mg daily. -Continue other medications at current doses.   -Counseled on lifestyle modifications for blood pressure control including reduced dietary sodium, increased exercise, adequate sleep. -Encouraged patient to check BP at home and bring log of readings to next visit. Counseled on proper use of home BP cuff.    Results reviewed and written information provided.    Written patient instructions provided. Patient verbalized understanding of treatment plan.  Total time in face to face counseling 30 minutes.    Follow-up:  Pharmacist prn. PCP clinic visit in a couple of weeks.   Benard Halsted, PharmD, [place], [place] [phone]

## 2022-03-27 ENCOUNTER — Other Ambulatory Visit: Payer: Self-pay

## 2022-03-27 ENCOUNTER — Ambulatory Visit: Payer: 59 | Attending: Internal Medicine | Admitting: Pharmacist

## 2022-03-27 ENCOUNTER — Encounter: Payer: Self-pay | Admitting: Pharmacist

## 2022-03-27 ENCOUNTER — Telehealth: Payer: Self-pay | Admitting: Internal Medicine

## 2022-03-27 VITALS — BP 125/82

## 2022-03-27 DIAGNOSIS — I10 Essential (primary) hypertension: Secondary | ICD-10-CM | POA: Diagnosis not present

## 2022-03-27 MED ORDER — AMLODIPINE BESYLATE 5 MG PO TABS
5.0000 mg | ORAL_TABLET | Freq: Every day | ORAL | 1 refills | Status: DC
  Filled 2022-03-27: qty 90, 90d supply, fill #0
  Filled 2022-06-10: qty 90, 90d supply, fill #1

## 2022-03-27 NOTE — Telephone Encounter (Signed)
Patient states they left a tube from her last surgery aug-sep, patient states its starting to bother her

## 2022-03-28 NOTE — Telephone Encounter (Signed)
Called & spoke to the patient. Verified name & DOB. Patient stated that there is a knot near the surgery that she previously had and would like X-rays to further evaluate. Informed patient that she is able to come in on 04/01/22 at 8:30 in the  walk-in schedule and wait to see if any providers have a no-show to further evaluate the "knot" in person. Patient expressed understanding & requested to come in on 04/01/22. Patient has been scheduled.

## 2022-04-01 ENCOUNTER — Other Ambulatory Visit: Payer: Self-pay

## 2022-04-01 ENCOUNTER — Ambulatory Visit: Payer: 59 | Attending: Internal Medicine | Admitting: Family Medicine

## 2022-04-01 ENCOUNTER — Encounter: Payer: Self-pay | Admitting: Family Medicine

## 2022-04-01 VITALS — BP 143/81 | HR 66 | Temp 98.0°F | Ht 65.0 in | Wt 190.6 lb

## 2022-04-01 DIAGNOSIS — R0781 Pleurodynia: Secondary | ICD-10-CM | POA: Diagnosis not present

## 2022-04-01 MED ORDER — DICLOFENAC SODIUM 1 % EX GEL
4.0000 g | Freq: Four times a day (QID) | CUTANEOUS | 1 refills | Status: DC
  Filled 2022-04-01: qty 100, 7d supply, fill #0
  Filled 2022-07-10: qty 100, 7d supply, fill #1

## 2022-04-01 NOTE — Progress Notes (Signed)
Subjective:  Patient ID: Brandy Stevenson, female    DOB: Dec 11, 1955  Age: 67 y.o. MRN: [id number]  CC: Cyst   HPI Brandy Stevenson is a 67 y.o. year old female patient of Dr. Wynetta Emery with a history of hypertension, COPD, GERD, hyperlipidemia, PAD (status post left axillobifemoral bypass graft), left breast cancer (status postlumpectomy and radiation), depression and anxiety, chronic low back pain seen today for an acute visit.  Interval History:  Last PCP visit was in 12/2021 for chronic disease management.  But she was seen by the clinical pharmacist for management of hypertension last week.  Visit with vascular surgery was on 02/13/2022.  She Complains of a knot on the left side of her thorax and she feels like it moves and is somewhat painful when moving and she has to blow out through her mouth for relief. She states it has been present since 2009 when she had breast ca but it was not bothersome then. She also states ' a tube was left in her left side when she had her bypass in 10/2021'. Past Medical History:  Diagnosis Date   Arthritis    back problems to be evaluated by neurology next month   Cancer [facility]) 2009   L, Breast, radiation and surgery.   COPD (chronic obstructive pulmonary disease) (HCC)    Depression    GERD (gastroesophageal reflux disease)    Hyperlipemia    Hypertension    Leriche syndrome (HCC)    Infrarenal aortic occlusion extending to bilateral internal iliac arteries.  External iliacs fill via collaterals from lumbar arteries to hypogastric/internal iliac arteries -minimal disease on the right side and no disease on the left side leg arteries distally.   Peripheral vascular disease ([place]) 2013   Pre-diabetes     Past Surgical History:  Procedure Laterality Date   APPENDECTOMY  1980   AXILLARY-FEMORAL BYPASS GRAFT Bilateral 10/29/2021   Procedure: LEFT AXILLOBIFEMORAL BYPASS GRAFT;  Surgeon: [name], MD;  Location: [facility];   Service: Vascular;  Laterality: Bilateral;   BREAST LUMPECTOMY Left    s/p radiation therapy    BREAST SURGERY     COLONOSCOPY  07/2015   COLONOSCOPY WITH PROPOFOL N/A 06/26/2015   Procedure: COLONOSCOPY WITH PROPOFOL;  Surgeon: [name], MD;  Location: [facility];  Service: Endoscopy;  Laterality: N/A;   CT CTA CORONARY W/CA SCORE W/CM &/OR WO/CM  12/10/2017   Coronary calcium score is 0.  No evidence of CAD.   PERIPHERAL VASCULAR CATHETERIZATION N/A 10/22/2015   Procedure: Abdominal Aortogram w/Lower Extremity;  Surgeon: [name], MD;  [facility]: Aortic arch widely patent as far as innominate artery, right subclavian artery, right vertebral artery and right, common carotid.  Bilateral single renal arteries - Nonocclusive disease. Infrarenal aorta 100% - bilateral common Iliac 100% --> B Ex Iliacs fill via Int Iliac. Leg OK   TRANSTHORACIC ECHOCARDIOGRAM  08/2017   EF 60-65%. No RWMA.  Normal DF.  Normal valves.    TUBAL LIGATION  1991    Family History  Problem Relation Age of Onset   Hyperlipidemia Mother    Hypertension Mother    Heart disease Mother        Atrial Fib.   Hyperlipidemia Sister    Hypertension Sister    Heart disease Sister        Before age 49,  CHF   Heart attack Brother    Hyperlipidemia Brother    Hypertension Brother  Heart disease Brother    Hypertension Daughter     Social History   Socioeconomic History   Marital status: Married    Spouse name: Not on file   Number of children: Not on file   Years of education: Not on file   Highest education level: Not on file  Occupational History   Not on file  Tobacco Use   Smoking status: Former    Packs/day: 0.25    Years: 40.00    Total pack years: 10.00    Types: Cigarettes    Quit date: 02/08/2017    Years since quitting: 5.1    Passive exposure: Never   Smokeless tobacco: Never  Vaping Use   Vaping Use: Never used  Substance and Sexual Activity    Alcohol use: No   Drug use: No   Sexual activity: Yes  Other Topics Concern   Not on file  Social History Narrative   Not on file   Social Determinants of Health   Financial Resource Strain: Low Risk  (01/10/2022)   Overall Financial Resource Strain (CARDIA)    Difficulty of Paying Living Expenses: Not hard at all  Food Insecurity: No Food Insecurity (01/10/2022)   Hunger Vital Sign    Worried About Running Out of Food in the Last Year: Never true    [place] in the Last Year: Never true  Transportation Needs: No Transportation Needs (01/10/2022)   PRAPARE - Hydrologist (Medical): No    Lack of Transportation (Non-Medical): No  Physical Activity: Insufficiently Active (01/10/2022)   Exercise Vital Sign    Days of Exercise per Week: 2 days    Minutes of Exercise per Session: 10 min  Stress: No Stress Concern Present (01/10/2022)   [place]    Feeling of Stress : Not at all  Social Connections: [place] (01/10/2022)   Social Connection and Isolation Panel [NHANES]    Frequency of Communication with Friends and Family: More than three times a week    Frequency of Social Gatherings with Friends and Family: Three times a week    Attends Religious Services: More than 4 times per year    Active Member of Clubs or Organizations: Yes    Attends Music therapist: More than 4 times per year    Marital Status: Married    Allergies  Allergen Reactions   Vicodin [Hydrocodone-Acetaminophen] Nausea And Vomiting and Other (See Comments)    Sweating   Bactrim [Sulfamethoxazole-Trimethoprim] Nausea And Vomiting    HA    Incruse Ellipta [Umeclidinium Bromide] Other (See Comments)    Dry mouth   Tramadol Nausea And Vomiting   Wellbutrin [Bupropion] Other (See Comments)    Made me feel bad and sleepy    Outpatient Medications Prior to Visit  Medication Sig  Dispense Refill   acetaminophen (TYLENOL) 500 MG tablet Take 1,000 mg by mouth every 8 (eight) hours.     albuterol (PROAIR HFA) 108 (90 Base) MCG/ACT inhaler Inhale 2 puffs into the lungs every 6 (six) hours as needed for wheezing or shortness of breath. 8.5 g 3   amLODipine (NORVASC) 5 MG tablet Take 1 tablet (5 mg total) by mouth daily. 90 tablet 1   aspirin EC 81 MG tablet Take 1 tablet (81 mg total) by mouth daily. 90 tablet 3   atorvastatin (LIPITOR) 80 MG tablet Take 1 tablet (80 mg total) by mouth at  bedtime. 90 tablet 1   carvedilol (COREG) 6.25 MG tablet TAKE 1 TABLET (6.25 MG TOTAL) BY MOUTH 2 (TWO) TIMES DAILY. 180 tablet 1   fluticasone-salmeterol (ADVAIR) 250-50 MCG/ACT AEPB Inhale 1 puff into the lungs in the morning and at bedtime. 60 each 6   gabapentin (NEURONTIN) 300 MG capsule TAKE 1 CAPSULE BY MOUTH AT  BEDTIME 100 capsule 2   hydrALAZINE (APRESOLINE) 25 MG tablet Take 1 tablet (25 mg total) by mouth 3 (three) times daily. (Patient taking differently: Take 25 mg by mouth See admin instructions. Take 25 mg twice daily, may take a third 25 mg dose as needed for high blood pressure) 180 tablet 6   meloxicam (MOBIC) 15 MG tablet TAKE 1 TABLET BY MOUTH DAILY 100 tablet 0   nystatin (MYCOSTATIN) 100000 UNIT/ML suspension SWISH AND SWALLOW 5 MLS (500,000 UNITS TOTAL) BY MOUTH 4 (FOUR) TIMES DAILY. 100 mL 0   omeprazole (PRILOSEC) 40 MG capsule TAKE 1 CAPSULE BY MOUTH DAILY 100 capsule 1   potassium chloride (KLOR-CON) 10 MEQ tablet Take 2 tablets (20 mEq total) by mouth daily. 60 tablet 2   spironolactone (ALDACTONE) 25 MG tablet Take 1 tablet (25 mg total) by mouth daily. 90 tablet 3   cilostazol (PLETAL) 50 MG tablet TAKE 1 TABLET BY MOUTH TWICE  DAILY (Patient not taking: Reported on 04/01/2022) 180 tablet 3   Menthol, Topical Analgesic, (BIOFREEZE) 5 % PTCH Apply 0.5 patches topically daily as needed (pain). (Patient not taking: Reported on 04/01/2022)     oxyCODONE-acetaminophen  (PERCOCET/ROXICET) 5-325 MG tablet Take 1 tablet by mouth every 6 (six) hours as needed for moderate pain. (Patient not taking: Reported on 04/01/2022) 20 tablet 0   No facility-administered medications prior to visit.     ROS Review of Systems  Constitutional:  Negative for activity change and appetite change.  HENT:  Negative for sinus pressure and sore throat.   Respiratory:  Negative for chest tightness, shortness of breath and wheezing.   Cardiovascular:  Negative for chest pain and palpitations.  Gastrointestinal:  Negative for abdominal distention, abdominal pain and constipation.  Genitourinary: Negative.   Musculoskeletal:        See HPI  Psychiatric/Behavioral:  Negative for behavioral problems and dysphoric mood.     Objective:  BP (!) 143/81   Pulse 66   Temp 98 F (36.7 C) (Oral)   Ht '5\' 5"'$  (1.651 m)   Wt 190 lb 9.6 oz (86.5 kg)   SpO2 99%   BMI 31.72 kg/m      04/01/2022    9:52 AM 03/27/2022    3:40 PM 02/13/2022    1:26 PM  BP/Weight  Systolic BP 295 188 416  Diastolic BP 81 82 87  Wt. (Lbs) 190.6  186  BMI 31.72 kg/m2  30.95 kg/m2      Physical Exam Constitutional:      Appearance: She is well-developed.  Cardiovascular:     Rate and Rhythm: Normal rate.     Heart sounds: Normal heart sounds. No murmur heard. Pulmonary:     Effort: Pulmonary effort is normal.     Breath sounds: Normal breath sounds. No wheezing or rales.     Comments: Left inframammary region with induration along rib extending laterally towards midline with associated tenderness to palpation Right side is not tender with no induration noted Chest:     Chest wall: No tenderness.  Abdominal:     General: Bowel sounds are normal. There is no distension.  Palpations: Abdomen is soft. There is no mass.     Tenderness: There is no abdominal tenderness.  Musculoskeletal:        General: Normal range of motion.     Right lower leg: No edema.     Left lower leg: No edema.   Neurological:     Mental Status: She is alert and oriented to person, place, and time.  Psychiatric:        Mood and Affect: Mood normal.        Latest Ref Rng & Units 10/30/2021    5:30 AM 10/22/2021    9:24 AM 08/20/2021    9:29 AM  CMP  Glucose 70 - 99 mg/dL 114  92  90   BUN 8 - 23 mg/dL '10  10  12   '$ Creatinine 0.44 - 1.00 mg/dL 0.84  0.82  0.85   Sodium 135 - 145 mmol/L 137  139  141   Potassium 3.5 - 5.1 mmol/L 3.7  3.5  4.3   Chloride 98 - 111 mmol/L 100  107  104   CO2 22 - 32 mmol/L '24  23  21   '$ Calcium 8.9 - 10.3 mg/dL 9.0  9.0  9.5   Total Protein 6.5 - 8.1 g/dL  7.6    Total Bilirubin 0.3 - 1.2 mg/dL  0.4    Alkaline Phos 38 - 126 U/L  66    AST 15 - 41 U/L  20    ALT 0 - 44 U/L  25      Lipid Panel     Component Value Date/Time   CHOL 117 10/30/2021 0530   CHOL 150 06/14/2021 1458   TRIG 49 10/30/2021 0530   HDL 54 10/30/2021 0530   HDL 67 06/14/2021 1458   CHOLHDL 2.2 10/30/2021 0530   VLDL 10 10/30/2021 0530   LDLCALC 53 10/30/2021 0530   LDLCALC 65 06/14/2021 1458    CBC    Component Value Date/Time   WBC 6.1 12/12/2021 1501   WBC 8.2 11/01/2021 1206   RBC 3.57 (L) 12/12/2021 1501   RBC 2.94 (L) 11/01/2021 1206   HGB 11.3 12/12/2021 1501   HGB 13.8 06/12/2008 0939   HCT 34.4 12/12/2021 1501   HCT 40.2 06/12/2008 0939   PLT 229 12/12/2021 1501   MCV 96 12/12/2021 1501   MCV 90.1 06/12/2008 0939   MCH 31.7 12/12/2021 1501   MCH 32.7 11/01/2021 1206   MCHC 32.8 12/12/2021 1501   MCHC 32.9 11/01/2021 1206   RDW 12.7 12/12/2021 1501   RDW 13.9 06/12/2008 0939   LYMPHSABS 1.8 08/07/2021 0940   LYMPHSABS 0.4 (L) 03/10/2017 1335   LYMPHSABS 1.5 06/12/2008 0939   MONOABS 0.5 08/07/2021 0940   MONOABS 0.6 06/12/2008 0939   EOSABS 0.1 08/07/2021 0940   EOSABS 0.1 03/10/2017 1335   BASOSABS 0.1 08/07/2021 0940   BASOSABS 0.0 03/10/2017 1335   BASOSABS 0.0 06/12/2008 0939    Lab Results  Component Value Date   HGBA1C 5.7 (H) 06/14/2021     Assessment & Plan:  1. Rib pain on left side She did have a left axillofemoral bypass but states symptoms have been present before her bypass and worsened recently It is possible that she has some radiation changes in her rib cage with superimposed costochondritis I will order chest x-ray to better evaluate her rib cage Advised to use topical Voltaren gel Also recommended to discuss this with her vascular surgeon. - DG Ribs Bilateral W/Chest;  Future - diclofenac Sodium (VOLTAREN) 1 % GEL; Apply 4 g topically 4 (four) times daily.  Dispense: 100 g; Refill: 1    Meds ordered this encounter  Medications   diclofenac Sodium (VOLTAREN) 1 % GEL    Sig: Apply 4 g topically 4 (four) times daily.    Dispense:  100 g    Refill:  1    Follow-up: Return for previously scheduled appointment.       [name], MD, FAAFP. [facility] [place], [place]   04/01/2022, 12:56 PM

## 2022-04-01 NOTE — Patient Instructions (Signed)
Costochondritis  Costochondritis is irritation and swelling (inflammation) of the tissue that connects the ribs to the breastbone (sternum). This tissue is called cartilage. This condition causes pain in the front of the chest. The pain often starts slowly. It may be in more than one rib. What are the causes? The cause of this condition is not always known. It can come from stress on the sternum. The cause of this stress could be: Chest injury. Exercise or activity. This may include lifting. Very bad coughing. What increases the risk? Being female. Being 27-85 years old. Starting a new exercise or work activity. Having low levels of vitamin D. Having a condition that makes you cough a lot. What are the signs or symptoms? Chest pain that: Starts slowly. It can be sharp or dull. Gets worse with deep breathing, coughing, or exercise. Gets better with rest. May be worse when you press on your ribs and breastbone. How is this treated? In most cases, this condition goes away on its own over time. You may need to take an NSAID, such as ibuprofen. This can help reduce pain. You may also need to: Rest and stay away from activities that make pain worse. Put heat or ice on the area that hurts. Do exercises to stretch your chest muscles. If these treatments do not help, your doctor may inject a medicine to numb the area. This can help relieve the pain. Follow these instructions at home: Managing pain, stiffness, and swelling     If told, put ice on the painful area. To do this: Put ice in a plastic bag. Place a towel between your skin and the bag. Leave the ice on for 20 minutes, 2-3 times a day. If told, put heat on the affected area. Do this as often as told by your doctor. Use the heat source that your doctor recommends, such as a moist heat pack or a heating pad. Place a towel between your skin and the heat source. Leave the heat on for 20-30 minutes. If your skin turns bright red,  take off the ice or heat right away to prevent skin damage. The risk of skin damage is higher if you cannot feel pain, heat, or cold. Activity Rest as told by your doctor. Do not do things that make your pain worse. This includes activities that use your chest, belly (abdomen), and side muscles. You may have to avoid lifting. Ask your doctor how much you can safely lift. Return to your normal activities when your doctor says that it is safe. General instructions Take over-the-counter and prescription medicines only as told by your doctor. Contact a doctor if: You have chills or a fever. Your pain does not go away or gets worse. You have a cough that does not go away. Get help right away if: You have a hard time breathing. You have very bad chest pain that does not get better with medicines, heat, or ice. These symptoms may be an emergency. Get help right away. Call 911. Do not wait to see if the symptoms will go away. Do not drive yourself to the hospital. This information is not intended to replace advice given to you by your health care provider. Make sure you discuss any questions you have with your health care provider. Document Revised: 09/05/2021 Document Reviewed: 09/05/2021 Elsevier Patient Education  [place].

## 2022-04-01 NOTE — Progress Notes (Signed)
Knot on left side.

## 2022-04-03 ENCOUNTER — Ambulatory Visit
Admission: RE | Admit: 2022-04-03 | Discharge: 2022-04-03 | Disposition: A | Discharge status: Home / Self Care | Payer: 59 | Source: Ambulatory Visit | Attending: Family Medicine | Admitting: Family Medicine

## 2022-04-03 DIAGNOSIS — R0781 Pleurodynia: Secondary | ICD-10-CM

## 2022-04-03 DIAGNOSIS — Z853 Personal history of malignant neoplasm of breast: Secondary | ICD-10-CM | POA: Diagnosis not present

## 2022-04-03 DIAGNOSIS — I700 Atherosclerosis of aorta: Secondary | ICD-10-CM | POA: Diagnosis not present

## 2022-04-03 DIAGNOSIS — N644 Mastodynia: Secondary | ICD-10-CM | POA: Diagnosis not present

## 2022-04-13 ENCOUNTER — Other Ambulatory Visit: Payer: Self-pay | Admitting: Internal Medicine

## 2022-04-13 DIAGNOSIS — E875 Hyperkalemia: Secondary | ICD-10-CM

## 2022-04-14 ENCOUNTER — Ambulatory Visit: Payer: Self-pay | Admitting: Internal Medicine

## 2022-04-14 ENCOUNTER — Other Ambulatory Visit: Payer: Self-pay

## 2022-04-14 ENCOUNTER — Other Ambulatory Visit: Payer: Self-pay | Admitting: Internal Medicine

## 2022-04-14 DIAGNOSIS — I739 Peripheral vascular disease, unspecified: Secondary | ICD-10-CM

## 2022-04-14 DIAGNOSIS — I10 Essential (primary) hypertension: Secondary | ICD-10-CM

## 2022-04-14 DIAGNOSIS — Z760 Encounter for issue of repeat prescription: Secondary | ICD-10-CM

## 2022-04-14 MED ORDER — POTASSIUM CHLORIDE ER 10 MEQ PO TBCR
20.0000 meq | EXTENDED_RELEASE_TABLET | Freq: Every day | ORAL | 0 refills | Status: DC
  Filled 2022-04-14: qty 180, 90d supply, fill #0

## 2022-04-14 NOTE — Telephone Encounter (Signed)
Requested Prescriptions  Pending Prescriptions Disp Refills   potassium chloride (KLOR-CON) 10 MEQ tablet 180 tablet 0    Sig: Take 2 tablets (20 mEq total) by mouth daily.     Endocrinology:  Minerals - Potassium Supplementation Passed - 04/13/2022 11:09 PM      Passed - K in normal range and within 360 days    Potassium  Date Value Ref Range Status  10/30/2021 3.7 3.5 - 5.1 mmol/L Final         Passed - Cr in normal range and within 360 days    Creat  Date Value Ref Range Status  04/17/2016 0.77 0.50 - 1.05 mg/dL Final    Comment:      For patients > or = 66 years of age: The upper reference limit for Creatinine is approximately 13% higher for people identified as African-American.      Creatinine, Ser  Date Value Ref Range Status  10/30/2021 0.84 0.44 - 1.00 mg/dL Final         Passed - Valid encounter within last 12 months    Recent Outpatient Visits           1 week ago Rib pain on left side   [place], Charlane Ferretti, MD   2 weeks ago Essential hypertension   [place], Jarome Matin, RPH-CPP   4 months ago Essential hypertension   [place], MD   10 months ago Essential hypertension   [place], Deborah B, MD   1 year ago Essential hypertension   [place], MD       Future Appointments             In 3 days [name], MD [place]

## 2022-04-15 ENCOUNTER — Other Ambulatory Visit: Payer: Self-pay

## 2022-04-15 NOTE — Telephone Encounter (Signed)
Requested Prescriptions  Pending Prescriptions Disp Refills   cilostazol (PLETAL) 50 MG tablet [Pharmacy Med Name: CILOSTAZOL  50MG  TAB] 200 tablet 1    Sig: TAKE 1 TABLET BY MOUTH TWICE  DAILY     Hematology: Antiplatelets - cilostazol Passed - 04/14/2022  7:13 AM      Passed - PLT in normal range and within 360 days    Platelets  Date Value Ref Range Status  12/12/2021 229 150 - 450 x10E3/uL Final         Passed - WBC in normal range and within 360 days    WBC  Date Value Ref Range Status  12/12/2021 6.1 3.4 - 10.8 x10E3/uL Final  11/01/2021 8.2 4.0 - 10.5 K/uL Final         Passed - Valid encounter within last 6 months    Recent Outpatient Visits           2 weeks ago Rib pain on left side   [place], Enobong, MD   2 weeks ago Essential hypertension   [place], Jarome Matin, RPH-CPP   4 months ago Essential hypertension   [place], MD   10 months ago Essential hypertension   [place], Deborah B, MD   1 year ago Essential hypertension   [place], MD       Future Appointments             In 2 days [name], MD [place]             meloxicam [facility]) 15 MG tablet [Pharmacy Med Name: Meloxicam 15 MG Oral Tablet] 100 tablet 2    Sig: TAKE 1 TABLET BY MOUTH DAILY     Analgesics:  COX2 Inhibitors Failed - 04/14/2022  7:13 AM      Failed - Manual Review: Labs are only required if the patient has taken medication for more than 8 weeks.      Passed - HGB in normal range and within 360 days    Hemoglobin  Date Value Ref Range Status  12/12/2021 11.3 11.1 - 15.9 g/dL Final   HGB  Date Value Ref Range Status  06/12/2008 13.8 11.6 - 15.9 g/dL Final         Passed - Cr in  normal range and within 360 days    Creat  Date Value Ref Range Status  04/17/2016 0.77 0.50 - 1.05 mg/dL Final    Comment:      For patients > or = 66 years of age: The upper reference limit for Creatinine is approximately 13% higher for people identified as African-American.      Creatinine, Ser  Date Value Ref Range Status  10/30/2021 0.84 0.44 - 1.00 mg/dL Final         Passed - HCT in normal range and within 360 days    HCT  Date Value Ref Range Status  06/12/2008 40.2 34.8 - 46.6 % Final   Hematocrit  Date Value Ref Range Status  12/12/2021 34.4 34.0 - 46.6 % Final         Passed - AST in normal range and within 360 days    AST  Date Value Ref Range Status  10/22/2021 20 15 - 41 U/L  Final         Passed - ALT in normal range and within 360 days    ALT  Date Value Ref Range Status  10/22/2021 25 0 - 44 U/L Final         Passed - eGFR is 30 or above and within 360 days    GFR, Est African American  Date Value Ref Range Status  04/17/2016 >89 >=60 mL/min Final   GFR calc Af Amer  Date Value Ref Range Status  04/28/2019 93 >59 mL/min/1.73 Final   GFR, Est Non African American  Date Value Ref Range Status  04/17/2016 85 >=60 mL/min Final   GFR, Estimated  Date Value Ref Range Status  10/30/2021 >60 >60 mL/min Final    Comment:    (NOTE) Calculated using the CKD-EPI Creatinine Equation (2021)    eGFR  Date Value Ref Range Status  08/20/2021 76 >59 mL/min/1.73 Final         Passed - Patient is not pregnant      Passed - Valid encounter within last 12 months    Recent Outpatient Visits           2 weeks ago Rib pain on left side   [place], Enobong, MD   2 weeks ago Essential hypertension   [place], Jarome Matin, RPH-CPP   4 months ago Essential hypertension   [place], MD   10 months ago  Essential hypertension   [place], Deborah B, MD   1 year ago Essential hypertension   [place], MD       Future Appointments             In 2 days [name], MD [place]            Refused Prescriptions Disp Refills   lisinopril (ZESTRIL) 40 MG tablet [Pharmacy Med Name: Lisinopril 40 MG Oral Tablet] 100 tablet 2    Sig: TAKE 1 TABLET BY MOUTH DAILY     Cardiovascular:  ACE Inhibitors Failed - 04/14/2022  7:13 AM      Failed - Last BP in normal range    BP Readings from Last 1 Encounters:  04/01/22 (!) 143/81         Passed - Cr in normal range and within 180 days    Creat  Date Value Ref Range Status  04/17/2016 0.77 0.50 - 1.05 mg/dL Final    Comment:      For patients > or = 66 years of age: The upper reference limit for Creatinine is approximately 13% higher for people identified as African-American.      Creatinine, Ser  Date Value Ref Range Status  10/30/2021 0.84 0.44 - 1.00 mg/dL Final         Passed - K in normal range and within 180 days    Potassium  Date Value Ref Range Status  10/30/2021 3.7 3.5 - 5.1 mmol/L Final         Passed - Patient is not pregnant      Passed - Valid encounter within last 6 months    Recent Outpatient Visits           2 weeks ago Rib pain on left side   [facility]  [place], Enobong, MD   2 weeks ago Essential hypertension   [place], Jarome Matin, RPH-CPP   4 months ago Essential hypertension   [place], MD   10 months ago Essential hypertension   [place], MD   1 year ago Essential hypertension   [place], MD        Future Appointments             In 2 days [name], MD [place]             amLODipine ([place]) 10 MG tablet [Pharmacy Med Name: amLODIPine Besylate 10 MG Oral Tablet] 100 tablet 2    Sig: TAKE 1 TABLET BY MOUTH AT  BEDTIME     Cardiovascular: Calcium Channel Blockers 2 Failed - 04/14/2022  7:13 AM      Failed - Last BP in normal range    BP Readings from Last 1 Encounters:  04/01/22 (!) 143/81         Passed - Last Heart Rate in normal range    Pulse Readings from Last 1 Encounters:  04/01/22 66         Passed - Valid encounter within last 6 months    Recent Outpatient Visits           2 weeks ago Rib pain on left side   [place], Charlane Ferretti, MD   2 weeks ago Essential hypertension   [place], Jarome Matin, RPH-CPP   4 months ago Essential hypertension   [place], MD   10 months ago Essential hypertension   [place], Deborah B, MD   1 year ago Essential hypertension   [place], MD       Future Appointments             In 2 days [name], MD [place]

## 2022-04-17 ENCOUNTER — Ambulatory Visit: Payer: 59 | Attending: Internal Medicine | Admitting: Internal Medicine

## 2022-04-17 ENCOUNTER — Other Ambulatory Visit: Payer: Self-pay

## 2022-04-17 ENCOUNTER — Encounter: Payer: Self-pay | Admitting: Internal Medicine

## 2022-04-17 VITALS — BP 117/77 | HR 62 | Temp 98.0°F | Ht 65.0 in | Wt 191.0 lb

## 2022-04-17 DIAGNOSIS — Z86010 Personal history of colonic polyps: Secondary | ICD-10-CM | POA: Diagnosis not present

## 2022-04-17 DIAGNOSIS — I7409 Other arterial embolism and thrombosis of abdominal aorta: Secondary | ICD-10-CM

## 2022-04-17 DIAGNOSIS — J449 Chronic obstructive pulmonary disease, unspecified: Secondary | ICD-10-CM | POA: Diagnosis not present

## 2022-04-17 DIAGNOSIS — Z13820 Encounter for screening for osteoporosis: Secondary | ICD-10-CM

## 2022-04-17 DIAGNOSIS — Z1211 Encounter for screening for malignant neoplasm of colon: Secondary | ICD-10-CM | POA: Diagnosis not present

## 2022-04-17 DIAGNOSIS — R222 Localized swelling, mass and lump, trunk: Secondary | ICD-10-CM

## 2022-04-17 DIAGNOSIS — E669 Obesity, unspecified: Secondary | ICD-10-CM

## 2022-04-17 DIAGNOSIS — I10 Essential (primary) hypertension: Secondary | ICD-10-CM | POA: Diagnosis not present

## 2022-04-17 MED ORDER — TIOTROPIUM BROMIDE MONOHYDRATE 18 MCG IN CAPS
18.0000 ug | ORAL_CAPSULE | Freq: Every day | RESPIRATORY_TRACT | 12 refills | Status: DC
  Filled 2022-04-17: qty 30, 30d supply, fill #0
  Filled 2022-05-23 (×2): qty 30, 30d supply, fill #1
  Filled 2022-06-27: qty 30, 30d supply, fill #2
  Filled 2022-08-22: qty 30, 30d supply, fill #3
  Filled 2022-11-28: qty 30, 30d supply, fill #4
  Filled 2023-01-19: qty 30, 30d supply, fill #5
  Filled 2023-02-18 – 2023-03-11 (×2): qty 30, 30d supply, fill #6
  Filled 2023-04-07 – 2023-04-08 (×2): qty 30, 30d supply, fill #7

## 2022-04-17 MED ORDER — POTASSIUM CHLORIDE ER 10 MEQ PO TBCR
20.0000 meq | EXTENDED_RELEASE_TABLET | Freq: Every day | ORAL | 1 refills | Status: DC
  Filled 2022-04-17 – 2022-07-10 (×2): qty 180, 90d supply, fill #0
  Filled 2022-10-07: qty 180, 90d supply, fill #1

## 2022-04-17 MED ORDER — SPIRONOLACTONE 25 MG PO TABS
25.0000 mg | ORAL_TABLET | Freq: Every day | ORAL | 3 refills | Status: DC
  Filled 2022-04-17 – 2022-06-27 (×3): qty 90, 90d supply, fill #0
  Filled 2022-09-25: qty 90, 90d supply, fill #1
  Filled 2022-12-25: qty 90, 90d supply, fill #2

## 2022-04-17 NOTE — Progress Notes (Signed)
Patient ID: Brandy Stevenson, female    DOB: 08-18-1955  MRN: [id number]  CC: Hypertension (HTN f/u. Med refill. No to flu vax)   Subjective: Brandy Stevenson is a 67 y.o. female who presents for chronic ds management Her concerns today include:  Hx of PAD (axillobifemoral bypass graft BL 10/2021 ), HTN, HL, former tob dep, LT breast CA (lumpectomy and XRT 2009), GERD, COPD, Trigeminal neuralgia RT, dep/anxiety, OA shoulders   Saw Dr. Margarita Rana the end of last mth for  knot under LT breast There since dx with breast CA.  Has not changed in size Feels like something moves and aching in the area when she laughs and with certain movement.  Never paid attention to it until after her axillobifemoral bypass surgery Had neg rib x-ray last mth and Neg MMG 01/2022.  HTN:  on Norvasc 5 mg, Hydralazine 72m TID, Coreg 6.25 mg BID and Spir 25 mg daily Limits salt.  No CP/SOB HL:  taking and tolerating Lipitor 80 mg daily COPD:  Using Advair inh BID; having to use Albuterol 3-4x/day  Obesity: has not started exercising as yet.  Eating one meal a day but snacking on cakes and candy  HM: Referred for bone density study as part of her Medicare wellness visit last fall.  Patient states she has not received an appointment.  Due for repeat colonoscopy with Dr. SMichail Sermon  History of colon polyps. Patient Active Problem List   Diagnosis Date Noted   Prediabetes 06/15/2021   Influenza vaccine refused 02/03/2020   Resistant hypertension 01/20/2020   Tubular adenoma of colon 04/27/2019   Cervical radiculopathy 10/01/2018   Hypertensive retinopathy 09/08/2018   Seasonal allergies 06/29/2018   Aortoiliac occlusive disease ([place] 11/04/2017   DOE (dyspnea on exertion) 11/03/2017   Chronic chest wall pain 11/03/2017   Ichthyosis 10/06/2017   Former smoker 04/07/2017   Primary osteoarthritis of right shoulder 04/07/2017   Chronic midline low back pain without sciatica 04/07/2017   Hiatal hernia  05/01/2016   DJD (degenerative joint disease) of cervical spine 04/17/2016   History of breast cancer 02/18/2016   Foot callus 02/18/2016   Anxiety and depression 05/22/2015   Insomnia 03/15/2015   Unspecified vitamin D deficiency 04/08/2013   Hyperlipidemia with target LDL less than 70 04/08/2013   Atherosclerotic PVD with intermittent claudication ([place] 03/22/2013   Accelerated hypertension 02/23/2013   GERD (gastroesophageal reflux disease) 02/23/2013   COPD (chronic obstructive pulmonary disease) ([place] 02/23/2013     Current Outpatient Medications on File Prior to Visit  Medication Sig Dispense Refill   acetaminophen (TYLENOL) 500 MG tablet Take 1,000 mg by mouth every 8 (eight) hours.     albuterol (PROAIR HFA) 108 (90 Base) MCG/ACT inhaler Inhale 2 puffs into the lungs every 6 (six) hours as needed for wheezing or shortness of breath. 8.5 g 3   amLODipine (NORVASC) 5 MG tablet Take 1 tablet (5 mg total) by mouth daily. 90 tablet 1   aspirin EC 81 MG tablet Take 1 tablet (81 mg total) by mouth daily. 90 tablet 3   atorvastatin (LIPITOR) 80 MG tablet Take 1 tablet (80 mg total) by mouth at bedtime. 90 tablet 1   carvedilol (COREG) 6.25 MG tablet TAKE 1 TABLET (6.25 MG TOTAL) BY MOUTH 2 (TWO) TIMES DAILY. 180 tablet 1   diclofenac Sodium (VOLTAREN) 1 % GEL Apply 4 g topically 4 (four) times daily. 100 g 1   fluticasone-salmeterol (ADVAIR) 250-50 MCG/ACT AEPB Inhale 1 puff into  the lungs in the morning and at bedtime. 60 each 6   gabapentin (NEURONTIN) 300 MG capsule TAKE 1 CAPSULE BY MOUTH AT  BEDTIME 100 capsule 2   hydrALAZINE (APRESOLINE) 25 MG tablet Take 1 tablet (25 mg total) by mouth 3 (three) times daily. (Patient taking differently: Take 25 mg by mouth See admin instructions. Take 25 mg twice daily, may take a third 25 mg dose as needed for high blood pressure) 180 tablet 6   meloxicam (MOBIC) 15 MG tablet TAKE 1 TABLET BY MOUTH DAILY 100 tablet 2   Menthol, Topical Analgesic,  (BIOFREEZE) 5 % PTCH Apply 0.5 patches topically daily as needed (pain).     nystatin (MYCOSTATIN) 100000 UNIT/ML suspension SWISH AND SWALLOW 5 MLS (500,000 UNITS TOTAL) BY MOUTH 4 (FOUR) TIMES DAILY. 100 mL 0   omeprazole (PRILOSEC) 40 MG capsule TAKE 1 CAPSULE BY MOUTH DAILY 100 capsule 1   potassium chloride (KLOR-CON) 10 MEQ tablet Take 2 tablets (20 mEq total) by mouth daily. 180 tablet 0   spironolactone (ALDACTONE) 25 MG tablet Take 1 tablet (25 mg total) by mouth daily. 90 tablet 3   cilostazol (PLETAL) 50 MG tablet TAKE 1 TABLET BY MOUTH TWICE  DAILY (Patient not taking: Reported on 04/17/2022) 200 tablet 1   No current facility-administered medications on file prior to visit.    Allergies  Allergen Reactions   Vicodin [Hydrocodone-Acetaminophen] Nausea And Vomiting and Other (See Comments)    Sweating   Bactrim [Sulfamethoxazole-Trimethoprim] Nausea And Vomiting    HA    Incruse Ellipta [Umeclidinium Bromide] Other (See Comments)    Dry mouth   Tramadol Nausea And Vomiting   Wellbutrin [Bupropion] Other (See Comments)    Made me feel bad and sleepy    Social History   Socioeconomic History   Marital status: Married    Spouse name: Not on file   Number of children: Not on file   Years of education: Not on file   Highest education level: Not on file  Occupational History   Not on file  Tobacco Use   Smoking status: Former    Packs/day: 0.25    Years: 40.00    Total pack years: 10.00    Types: Cigarettes    Quit date: 02/08/2017    Years since quitting: 5.1    Passive exposure: Never   Smokeless tobacco: Never  Vaping Use   Vaping Use: Never used  Substance and Sexual Activity   Alcohol use: No   Drug use: No   Sexual activity: Yes  Other Topics Concern   Not on file  Social History Narrative   Not on file   Social Determinants of Health   Financial Resource Strain: Low Risk  (01/10/2022)   Overall Financial Resource Strain (CARDIA)    Difficulty of  Paying Living Expenses: Not hard at all  Food Insecurity: No Food Insecurity (01/10/2022)   Hunger Vital Sign    Worried About Running Out of Food in the Last Year: Never true    Ran Out of Food in the Last Year: Never true  Transportation Needs: No Transportation Needs (01/10/2022)   PRAPARE - Hydrologist (Medical): No    Lack of Transportation (Non-Medical): No  Physical Activity: Insufficiently Active (01/10/2022)   Exercise Vital Sign    Days of Exercise per Week: 2 days    Minutes of Exercise per Session: 10 min  Stress: No Stress Concern Present (01/10/2022)   [employer]  of Occupational Health - Occupational Stress Questionnaire    Feeling of Stress : Not at all  Social Connections: Socially Integrated (01/10/2022)   Social Connection and Isolation Panel [NHANES]    Frequency of Communication with Friends and Family: More than three times a week    Frequency of Social Gatherings with Friends and Family: Three times a week    Attends Religious Services: More than 4 times per year    Active Member of Clubs or Organizations: Yes    Attends Archivist Meetings: More than 4 times per year    Marital Status: Married  Human resources officer Violence: Not At Risk (01/10/2022)   Humiliation, Afraid, Rape, and Kick questionnaire    Fear of Current or Ex-Partner: No    Emotionally Abused: No    Physically Abused: No    Sexually Abused: No    Family History  Problem Relation Age of Onset   Hyperlipidemia Mother    Hypertension Mother    Heart disease Mother        Atrial Fib.   Hyperlipidemia Sister    Hypertension Sister    Heart disease Sister        Before age 32,  CHF   Heart attack Brother    Hyperlipidemia Brother    Hypertension Brother    Heart disease Brother    Hypertension Daughter     Past Surgical History:  Procedure Laterality Date   APPENDECTOMY  1980   AXILLARY-FEMORAL BYPASS GRAFT Bilateral 10/29/2021   Procedure:  LEFT AXILLOBIFEMORAL BYPASS GRAFT;  Surgeon: [name], MD;  [facility];  Service: Vascular;  Laterality: Bilateral;   BREAST LUMPECTOMY Left    s/p radiation therapy    BREAST SURGERY     COLONOSCOPY  07/2015   COLONOSCOPY WITH PROPOFOL N/A 06/26/2015   Procedure: COLONOSCOPY WITH PROPOFOL;  Surgeon: [name], MD;  Location: [facility] ENDOSCOPY;  Service: Endoscopy;  Laterality: N/A;   CT CTA CORONARY W/CA SCORE W/CM &/OR WO/CM  12/10/2017   Coronary calcium score is 0.  No evidence of CAD.   PERIPHERAL VASCULAR CATHETERIZATION N/A 10/22/2015   Procedure: Abdominal Aortogram w/Lower Extremity;  Surgeon: [name], MD;  Location: [facility] INVASIVE CV LAB: Aortic arch widely patent as far as innominate artery, right subclavian artery, right vertebral artery and right, common carotid.  Bilateral single renal arteries - Nonocclusive disease. Infrarenal aorta 100% - bilateral common Iliac 100% --> B Ex Iliacs fill via Int Iliac. Leg OK   TRANSTHORACIC ECHOCARDIOGRAM  08/2017   EF 60-65%. No RWMA.  Normal DF.  Normal valves.    TUBAL LIGATION  1991    ROS: Review of Systems Negative except as stated above  PHYSICAL EXAM: BP 117/77 (BP Location: Left Arm, Patient Position: Sitting, Cuff Size: Large)   Pulse 62   Temp 98 F (36.7 C) (Oral)   Ht 5' 5"$  (1.651 m)   Wt 191 lb (86.6 kg)   SpO2 99%   BMI 31.78 kg/m   Wt Readings from Last 3 Encounters:  04/17/22 191 lb (86.6 kg)  04/01/22 190 lb 9.6 oz (86.5 kg)  02/13/22 186 lb (84.4 kg)    Physical Exam  General appearance - alert, well appearing, and in no distress Mental status - normal mood, behavior, speech, dress, motor activity, and thought processes Chest - clear to auscultation, no wheezes, rales or rhonchi, symmetric air entry Heart - normal rate, regular rhythm, normal S1, S2, no murmurs, rubs, clicks or  gallops Extremities - peripheral pulses normal, no pedal edema, no clubbing or cyanosis Skin:  Patient has a soft to firm 5 cm movable soft tissue mass under the left breast laterally.  It is not tender to touch.     Latest Ref Rng & Units 10/30/2021    5:30 AM 10/22/2021    9:24 AM 08/20/2021    9:29 AM  CMP  Glucose 70 - 99 mg/dL 114  92  90   BUN 8 - 23 mg/dL 10  10  12   $ Creatinine 0.44 - 1.00 mg/dL 0.84  0.82  0.85   Sodium 135 - 145 mmol/L 137  139  141   Potassium 3.5 - 5.1 mmol/L 3.7  3.5  4.3   Chloride 98 - 111 mmol/L 100  107  104   CO2 22 - 32 mmol/L 24  23  21   $ Calcium 8.9 - 10.3 mg/dL 9.0  9.0  9.5   Total Protein 6.5 - 8.1 g/dL  7.6    Total Bilirubin 0.3 - 1.2 mg/dL  0.4    Alkaline Phos 38 - 126 U/L  66    AST 15 - 41 U/L  20    ALT 0 - 44 U/L  25     Lipid Panel     Component Value Date/Time   CHOL 117 10/30/2021 0530   CHOL 150 06/14/2021 1458   TRIG 49 10/30/2021 0530   HDL 54 10/30/2021 0530   HDL 67 06/14/2021 1458   CHOLHDL 2.2 10/30/2021 0530   VLDL 10 10/30/2021 0530   LDLCALC 53 10/30/2021 0530   LDLCALC 65 06/14/2021 1458    CBC    Component Value Date/Time   WBC 6.1 12/12/2021 1501   WBC 8.2 11/01/2021 1206   RBC 3.57 (L) 12/12/2021 1501   RBC 2.94 (L) 11/01/2021 1206   HGB 11.3 12/12/2021 1501   HGB 13.8 06/12/2008 0939   HCT 34.4 12/12/2021 1501   HCT 40.2 06/12/2008 0939   PLT 229 12/12/2021 1501   MCV 96 12/12/2021 1501   MCV 90.1 06/12/2008 0939   MCH 31.7 12/12/2021 1501   MCH 32.7 11/01/2021 1206   MCHC 32.8 12/12/2021 1501   MCHC 32.9 11/01/2021 1206   RDW 12.7 12/12/2021 1501   RDW 13.9 06/12/2008 0939   LYMPHSABS 1.8 08/07/2021 0940   LYMPHSABS 0.4 (L) 03/10/2017 1335   LYMPHSABS 1.5 06/12/2008 0939   MONOABS 0.5 08/07/2021 0940   MONOABS 0.6 06/12/2008 0939   EOSABS 0.1 08/07/2021 0940   EOSABS 0.1 03/10/2017 1335   BASOSABS 0.1 08/07/2021 0940   BASOSABS 0.0 03/10/2017 1335   BASOSABS 0.0 06/12/2008 0939    ASSESSMENT AND PLAN: 1. Essential hypertension At goal.  Continue medications listed above. -  potassium chloride (KLOR-CON) 10 MEQ tablet; Take 2 tablets (20 mEq total) by mouth daily.  Dispense: 180 tablet; Refill: 1 - spironolactone (ALDACTONE) 25 MG tablet; Take 1 tablet (25 mg total) by mouth daily.  Dispense: 90 tablet; Refill: 3  2. Mass of chest wall, left I think this may be a small lipoma.  However given that it is somewhat bothersome to her, we will have general surgeon look at it. - Ambulatory referral to General Surgery  3. Aortoiliac occlusive disease ([place]) Patient is status post bilateral axillofemoral bypass bilaterally and doing well without claudication symptoms.  Continue on aspirin and atorvastatin  4. Chronic obstructive pulmonary disease, unspecified COPD type ([place]) Not well-controlled on Advair. We will add Spiriva. -  tiotropium (SPIRIVA HANDIHALER) 18 MCG inhalation capsule; Place 1 capsule (18 mcg total) into inhaler and inhale daily.  Dispense: 30 capsule; Refill: 12  5. Obesity (BMI 30.0-34.9) Good healthy eating habits encourage.  Advised healthier snacks like fruits or nuts.  6. History of colon polyps 7. Screening for colon cancer Referral submitted for her colonoscopy with Dr. Michail Sermon.  8. Osteoporosis screening - DG BONE DENSITY (DXA); Future     Patient was given the opportunity to ask questions.  Patient verbalized understanding of the plan and was able to repeat key elements of the plan.   This documentation was completed using Radio producer.  Any transcriptional errors are unintentional.  No orders of the defined types were placed in this encounter.    Requested Prescriptions    No prescriptions requested or ordered in this encounter    No follow-ups on file.  Karle Plumber, MD, FACP

## 2022-04-22 ENCOUNTER — Telehealth: Payer: Self-pay | Admitting: Internal Medicine

## 2022-04-22 NOTE — Telephone Encounter (Signed)
Patient is calling because Dr. Wynetta Emery put her on a new medication and the patient wants to know does Dr. Wynetta Emery want her to stay on fluticasone-salmeterol (ADVAIR) 250-50 MCG/ACT AEPB, spironolactone (ALDACTONE) 25 MG tablet, and the new medicine? Or does she want her to discontinue the fluticasone-salmeterol?

## 2022-04-23 NOTE — Telephone Encounter (Signed)
Called & spoke with the patient. Verified name & DOB. Informed to continue the Advair with the new inhaler Tiotropium per Dr.Johnson. Brandy Stevenson expressed verbal understanding.

## 2022-04-23 NOTE — Telephone Encounter (Signed)
Called LVM to call back

## 2022-05-01 ENCOUNTER — Other Ambulatory Visit: Payer: Self-pay | Admitting: Internal Medicine

## 2022-05-01 DIAGNOSIS — E785 Hyperlipidemia, unspecified: Secondary | ICD-10-CM

## 2022-05-08 ENCOUNTER — Other Ambulatory Visit: Payer: Self-pay

## 2022-05-08 ENCOUNTER — Other Ambulatory Visit: Payer: Self-pay | Admitting: Internal Medicine

## 2022-05-08 DIAGNOSIS — I10 Essential (primary) hypertension: Secondary | ICD-10-CM

## 2022-05-08 NOTE — Telephone Encounter (Signed)
Requested medication (s) are due for refill today:   Yes for both  Requested medication (s) are on the active medication list:   Yes for both  Future visit scheduled:   Yes 08/25/2022   Last ordered: Coreg 10/23/2021 #180, 1 refill;   Nystatin Suspension  02/06/2022 100 ml, 0 refills  Returned because no protocol assigned to the Nystatin.     Requested Prescriptions  Pending Prescriptions Disp Refills   carvedilol (COREG) 6.25 MG tablet 180 tablet 1    Sig: TAKE 1 TABLET (6.25 MG TOTAL) BY MOUTH 2 (TWO) TIMES DAILY.     Cardiovascular: Beta Blockers 3 Passed - 05/08/2022  9:23 AM      Passed - Cr in normal range and within 360 days    Creat  Date Value Ref Range Status  04/17/2016 0.77 0.50 - 1.05 mg/dL Final    Comment:      For patients > or = 66 years of age: The upper reference limit for Creatinine is approximately 13% higher for people identified as African-American.      Creatinine, Ser  Date Value Ref Range Status  10/30/2021 0.84 0.44 - 1.00 mg/dL Final         Passed - AST in normal range and within 360 days    AST  Date Value Ref Range Status  10/22/2021 20 15 - 41 U/L Final         Passed - ALT in normal range and within 360 days    ALT  Date Value Ref Range Status  10/22/2021 25 0 - 44 U/L Final         Passed - Last BP in normal range    BP Readings from Last 1 Encounters:  04/17/22 117/77         Passed - Last Heart Rate in normal range    Pulse Readings from Last 1 Encounters:  04/17/22 62         Passed - Valid encounter within last 6 months    Recent Outpatient Visits           3 weeks ago Essential hypertension   [place], MD   1 month ago Rib pain on left side   [place], [place], MD   1 month ago Essential hypertension   [place], Jarome Matin, RPH-CPP   4 months ago Essential hypertension    [place], MD   10 months ago Essential hypertension   [place], MD       Future Appointments             In 3 months [name], MD [place]             nystatin (MYCOSTATIN) 100000 UNIT/ML suspension 100 mL 0    Sig: SWISH AND SWALLOW 5 MLS (500,000 UNITS TOTAL) BY MOUTH 4 (FOUR) TIMES DAILY.     Off-Protocol Failed - 05/08/2022  9:23 AM      Failed - Medication not assigned to a protocol, review manually.      Passed - Valid encounter within last 12 months    Recent Outpatient Visits           3 weeks ago Essential hypertension   [place] Karle Plumber B, MD   1 month ago Rib pain on left side   [place], Enobong, MD   1 month ago Essential hypertension   [place], Jarome Matin, RPH-CPP   4 months ago Essential hypertension   [place], MD   10 months ago Essential hypertension   [place], MD       Future Appointments             In 3 months Wynetta Emery Dalbert Batman, MD [place]

## 2022-05-09 ENCOUNTER — Other Ambulatory Visit: Payer: Self-pay

## 2022-05-09 MED ORDER — NYSTATIN 100000 UNIT/ML MT SUSP
5.0000 mL | Freq: Four times a day (QID) | OROMUCOSAL | 0 refills | Status: DC
  Filled 2022-05-09: qty 100, 5d supply, fill #0

## 2022-05-09 MED ORDER — CARVEDILOL 6.25 MG PO TABS
ORAL_TABLET | Freq: Two times a day (BID) | ORAL | 0 refills | Status: DC
  Filled 2022-05-09: qty 180, 90d supply, fill #0

## 2022-05-13 ENCOUNTER — Other Ambulatory Visit: Payer: Self-pay

## 2022-05-21 DIAGNOSIS — I7409 Other arterial embolism and thrombosis of abdominal aorta: Secondary | ICD-10-CM | POA: Diagnosis not present

## 2022-05-21 DIAGNOSIS — I739 Peripheral vascular disease, unspecified: Secondary | ICD-10-CM | POA: Diagnosis not present

## 2022-05-23 ENCOUNTER — Other Ambulatory Visit: Payer: Self-pay

## 2022-06-16 ENCOUNTER — Other Ambulatory Visit: Payer: Self-pay

## 2022-06-16 ENCOUNTER — Other Ambulatory Visit: Payer: Self-pay | Admitting: Internal Medicine

## 2022-06-16 DIAGNOSIS — J449 Chronic obstructive pulmonary disease, unspecified: Secondary | ICD-10-CM

## 2022-06-16 MED ORDER — ALBUTEROL SULFATE HFA 108 (90 BASE) MCG/ACT IN AERS
2.0000 | INHALATION_SPRAY | Freq: Four times a day (QID) | RESPIRATORY_TRACT | 3 refills | Status: DC | PRN
Start: 2022-06-16 — End: 2022-11-05
  Filled 2022-06-16: qty 6.7, 20d supply, fill #0
  Filled 2022-06-27: qty 6.7, 25d supply, fill #0
  Filled 2022-08-07: qty 6.7, 25d supply, fill #1
  Filled 2022-09-08: qty 6.7, 25d supply, fill #2
  Filled 2022-10-07: qty 6.7, 25d supply, fill #3

## 2022-06-20 ENCOUNTER — Other Ambulatory Visit: Payer: Self-pay

## 2022-06-27 ENCOUNTER — Other Ambulatory Visit: Payer: Self-pay

## 2022-06-27 ENCOUNTER — Other Ambulatory Visit: Payer: Self-pay | Admitting: Internal Medicine

## 2022-06-27 DIAGNOSIS — J449 Chronic obstructive pulmonary disease, unspecified: Secondary | ICD-10-CM

## 2022-06-27 MED ORDER — FLUTICASONE-SALMETEROL 250-50 MCG/ACT IN AEPB
1.0000 | INHALATION_SPRAY | Freq: Two times a day (BID) | RESPIRATORY_TRACT | 2 refills | Status: AC
Start: 2022-06-27 — End: ?
  Filled 2022-06-27: qty 60, 30d supply, fill #0
  Filled 2022-08-22: qty 60, 30d supply, fill #1
  Filled 2022-09-25: qty 60, 30d supply, fill #2

## 2022-06-27 MED ORDER — NYSTATIN 100000 UNIT/ML MT SUSP
5.0000 mL | Freq: Four times a day (QID) | OROMUCOSAL | 0 refills | Status: DC
  Filled 2022-06-27: qty 100, 5d supply, fill #0

## 2022-07-10 ENCOUNTER — Other Ambulatory Visit: Payer: Self-pay

## 2022-07-10 ENCOUNTER — Other Ambulatory Visit: Payer: Self-pay | Admitting: Internal Medicine

## 2022-07-10 DIAGNOSIS — I10 Essential (primary) hypertension: Secondary | ICD-10-CM

## 2022-07-10 MED ORDER — HYDRALAZINE HCL 25 MG PO TABS
25.0000 mg | ORAL_TABLET | Freq: Three times a day (TID) | ORAL | 0 refills | Status: DC
Start: 2022-07-10 — End: 2022-10-07
  Filled 2022-07-10: qty 270, 90d supply, fill #0

## 2022-07-10 NOTE — Telephone Encounter (Signed)
Requested Prescriptions  Pending Prescriptions Disp Refills   hydrALAZINE (APRESOLINE) 25 MG tablet 270 tablet 0    Sig: Take 1 tablet (25 mg total) by mouth 3 (three) times daily.     Cardiovascular:  Vasodilators Failed - 07/10/2022  5:56 PM      Failed - RBC in normal range and within 360 days    RBC  Date Value Ref Range Status  12/12/2021 3.57 (L) 3.77 - 5.28 x10E6/uL Final  11/01/2021 2.94 (L) 3.87 - 5.11 MIL/uL Final         Failed - ANA Screen, Ifa, Serum in normal range and within 360 days    No results found for: "ANA", "ANATITER", "LABANTI"       Passed - HCT in normal range and within 360 days    HCT  Date Value Ref Range Status  06/12/2008 40.2 34.8 - 46.6 % Final   Hematocrit  Date Value Ref Range Status  12/12/2021 34.4 34.0 - 46.6 % Final         Passed - HGB in normal range and within 360 days    Hemoglobin  Date Value Ref Range Status  12/12/2021 11.3 11.1 - 15.9 g/dL Final   HGB  Date Value Ref Range Status  06/12/2008 13.8 11.6 - 15.9 g/dL Final         Passed - WBC in normal range and within 360 days    WBC  Date Value Ref Range Status  12/12/2021 6.1 3.4 - 10.8 x10E3/uL Final  11/01/2021 8.2 4.0 - 10.5 K/uL Final         Passed - PLT in normal range and within 360 days    Platelets  Date Value Ref Range Status  12/12/2021 229 150 - 450 x10E3/uL Final         Passed - Last BP in normal range    BP Readings from Last 1 Encounters:  04/17/22 117/77         Passed - Valid encounter within last 12 months    Recent Outpatient Visits           2 months ago Essential hypertension   [place] [facility] Marcine Matar, MD   3 months ago Rib pain on left side   [place] [facility] Hoy Register, MD   3 months ago Essential hypertension   [place] [facility] [place], Cornelius Moras, RPH-CPP   7 months ago Essential hypertension   [place] [facility] Marcine Matar, MD   1 year ago Essential hypertension   [place] [facility] Marcine Matar, MD       Future Appointments             In 1 month La[place] Benes, Binnie Rail, MD [facility]

## 2022-07-11 ENCOUNTER — Other Ambulatory Visit: Payer: Self-pay

## 2022-07-14 ENCOUNTER — Other Ambulatory Visit: Payer: Self-pay | Admitting: Internal Medicine

## 2022-07-14 ENCOUNTER — Other Ambulatory Visit: Payer: Self-pay

## 2022-07-14 DIAGNOSIS — I70219 Atherosclerosis of native arteries of extremities with intermittent claudication, unspecified extremity: Secondary | ICD-10-CM

## 2022-07-23 ENCOUNTER — Other Ambulatory Visit: Payer: Self-pay | Admitting: Internal Medicine

## 2022-07-23 DIAGNOSIS — Z760 Encounter for issue of repeat prescription: Secondary | ICD-10-CM

## 2022-08-07 ENCOUNTER — Other Ambulatory Visit: Payer: Self-pay | Admitting: Internal Medicine

## 2022-08-07 DIAGNOSIS — I10 Essential (primary) hypertension: Secondary | ICD-10-CM

## 2022-08-07 MED ORDER — CARVEDILOL 6.25 MG PO TABS
ORAL_TABLET | Freq: Two times a day (BID) | ORAL | 0 refills | Status: DC
Start: 2022-08-07 — End: 2022-09-09
  Filled 2022-08-07: qty 60, 30d supply, fill #0

## 2022-08-08 ENCOUNTER — Other Ambulatory Visit: Payer: Self-pay

## 2022-08-22 ENCOUNTER — Other Ambulatory Visit: Payer: Self-pay | Admitting: Internal Medicine

## 2022-08-22 ENCOUNTER — Other Ambulatory Visit: Payer: Self-pay

## 2022-08-22 MED ORDER — NYSTATIN 100000 UNIT/ML MT SUSP
5.0000 mL | Freq: Four times a day (QID) | OROMUCOSAL | 0 refills | Status: DC
  Filled 2022-08-22: qty 100, 5d supply, fill #0

## 2022-08-22 NOTE — Telephone Encounter (Signed)
Requested medications are due for refill today.  yes  Requested medications are on the active medications list.  yes  Last refill. 06/27/2022 0 rf  Future visit scheduled.   yes  Notes to clinic.  Medication not assigned to a protocol. Please review for refill.    Requested Prescriptions  Pending Prescriptions Disp Refills   nystatin (MYCOSTATIN) 100000 UNIT/ML suspension 100 mL 0    Sig: SWISH AND SWALLOW 5 MLS (500,000 UNITS TOTAL) BY MOUTH 4 (FOUR) TIMES DAILY.     Off-Protocol Failed - 08/22/2022 11:07 AM      Failed - Medication not assigned to a protocol, review manually.      Passed - Valid encounter within last 12 months    Recent Outpatient Visits           4 months ago Essential hypertension   [place] [facility] Jonah Blue B, MD   4 months ago Rib pain on left side   [place] [facility] Hoy Register, MD   4 months ago Essential hypertension   [facility] [place], Cornelius Moras, RPH-CPP   8 months ago Essential hypertension   [place] [facility] Marcine Matar, MD   1 year ago Essential hypertension   [place] [facility] Marcine Matar, MD       Future Appointments             In 3 days Marcine Matar, MD [facility]   In 2 weeks [place], Thomasene Ripple, NP [facility]

## 2022-08-25 ENCOUNTER — Ambulatory Visit: Payer: 59 | Attending: Internal Medicine | Admitting: Internal Medicine

## 2022-08-25 ENCOUNTER — Encounter: Payer: Self-pay | Admitting: Internal Medicine

## 2022-08-25 ENCOUNTER — Other Ambulatory Visit: Payer: Self-pay

## 2022-08-25 VITALS — BP 126/79 | HR 62 | Temp 97.8°F | Ht 65.0 in | Wt 196.0 lb

## 2022-08-25 DIAGNOSIS — I10 Essential (primary) hypertension: Secondary | ICD-10-CM

## 2022-08-25 DIAGNOSIS — Z860100 Personal history of colon polyps, unspecified: Secondary | ICD-10-CM

## 2022-08-25 DIAGNOSIS — E66811 Obesity, class 1: Secondary | ICD-10-CM

## 2022-08-25 DIAGNOSIS — R7303 Prediabetes: Secondary | ICD-10-CM

## 2022-08-25 DIAGNOSIS — E669 Obesity, unspecified: Secondary | ICD-10-CM | POA: Diagnosis not present

## 2022-08-25 DIAGNOSIS — J449 Chronic obstructive pulmonary disease, unspecified: Secondary | ICD-10-CM

## 2022-08-25 DIAGNOSIS — Z1211 Encounter for screening for malignant neoplasm of colon: Secondary | ICD-10-CM

## 2022-08-25 LAB — POCT GLYCOSYLATED HEMOGLOBIN (HGB A1C): HbA1c, POC (prediabetic range): 6 % (ref 5.7–6.4)

## 2022-08-25 LAB — GLUCOSE, POCT (MANUAL RESULT ENTRY): POC Glucose: 96 mg/dl (ref 70–99)

## 2022-08-25 MED ORDER — NYSTATIN 100000 UNIT/ML MT SUSP
5.0000 mL | Freq: Four times a day (QID) | OROMUCOSAL | 0 refills | Status: DC
  Filled 2022-09-25: qty 100, 5d supply, fill #0

## 2022-08-25 MED ORDER — AMLODIPINE BESYLATE 5 MG PO TABS
5.0000 mg | ORAL_TABLET | Freq: Every day | ORAL | 1 refills | Status: DC
Start: 2022-08-25 — End: 2022-12-25
  Filled 2022-08-25: qty 90, 90d supply, fill #0
  Filled 2022-12-17: qty 90, 90d supply, fill #1

## 2022-08-25 NOTE — Patient Instructions (Signed)
Prediabetes Eating Plan Prediabetes is a condition that causes blood sugar (glucose) levels to be higher than normal. This increases the risk for developing type 2 diabetes (type 2 diabetes mellitus). Working with a health care provider or nutrition specialist (dietitian) to make diet and lifestyle changes can help prevent the onset of diabetes. These changes may help you: Control your blood glucose levels. Improve your cholesterol levels. Manage your blood pressure. What are tips for following this plan? Reading food labels Read food labels to check the amount of fat, salt (sodium), and sugar in prepackaged foods. Avoid foods that have: Saturated fats. Trans fats. Added sugars. Avoid foods that have more than 300 milligrams (mg) of sodium per serving. Limit your sodium intake to less than 2,300 mg each day. Shopping Avoid buying pre-made and processed foods. Avoid buying drinks with added sugar. Cooking Cook with olive oil. Do not use butter, lard, or ghee. Bake, broil, grill, steam, or boil foods. Avoid frying. Meal planning  Work with your dietitian to create an eating plan that is right for you. This may include tracking how many calories you take in each day. Use a food diary, notebook, or mobile application to track what you eat at each meal. Consider following a Mediterranean diet. This includes: Eating several servings of fresh fruits and vegetables each day. Eating fish at least twice a week. Eating one serving each day of whole grains, beans, nuts, and seeds. Using olive oil instead of other fats. Limiting alcohol. Limiting red meat. Using nonfat or low-fat dairy products. Consider following a plant-based diet. This includes dietary choices that focus on eating mostly vegetables and fruit, grains, beans, nuts, and seeds. If you have high blood pressure, you may need to limit your sodium intake or follow a diet such as the DASH (Dietary Approaches to Stop Hypertension) eating  plan. The DASH diet aims to lower high blood pressure. Lifestyle Set weight loss goals with help from your health care team. It is recommended that most people with prediabetes lose 7% of their body weight. Exercise for at least 30 minutes 5 or more days a week. Attend a support group or seek support from a mental health counselor. Take over-the-counter and prescription medicines only as told by your health care provider. What foods are recommended? Fruits Berries. Bananas. Apples. Oranges. Grapes. Papaya. Mango. Pomegranate. Kiwi. Grapefruit. Cherries. Vegetables Lettuce. Spinach. Peas. Beets. Cauliflower. Cabbage. Broccoli. Carrots. Tomatoes. Squash. Eggplant. Herbs. Peppers. Onions. Cucumbers. Brussels sprouts. Grains Whole grains, such as whole-wheat or whole-grain breads, crackers, cereals, and pasta. Unsweetened oatmeal. Bulgur. Barley. Quinoa. Brown rice. Corn or whole-wheat flour tortillas or taco shells. Meats and other proteins Seafood. Poultry without skin. Lean cuts of pork and beef. Tofu. Eggs. Nuts. Beans. Dairy Low-fat or fat-free dairy products, such as yogurt, cottage cheese, and cheese. Beverages Water. Tea. Coffee. Sugar-free or diet soda. Seltzer water. Low-fat or nonfat milk. Milk alternatives, such as soy or almond milk. Fats and oils Olive oil. Canola oil. Sunflower oil. Grapeseed oil. Avocado. Walnuts. Sweets and desserts Sugar-free or low-fat pudding. Sugar-free or low-fat ice cream and other frozen treats. Seasonings and condiments Herbs. Sodium-free spices. Mustard. Relish. Low-salt, low-sugar ketchup. Low-salt, low-sugar barbecue sauce. Low-fat or fat-free mayonnaise. The items listed above may not be a complete list of recommended foods and beverages. Contact a dietitian for more information. What foods are not recommended? Fruits Fruits canned with syrup. Vegetables Canned vegetables. Frozen vegetables with butter or cream sauce. Grains Refined white  flour and flour   products, such as bread, pasta, snack foods, and cereals. Meats and other proteins Fatty cuts of meat. Poultry with skin. Breaded or fried meat. Processed meats. Dairy Full-fat yogurt, cheese, or milk. Beverages Sweetened drinks, such as iced tea and soda. Fats and oils Butter. Lard. Ghee. Sweets and desserts Baked goods, such as cake, cupcakes, pastries, cookies, and cheesecake. Seasonings and condiments Spice mixes with added salt. Ketchup. Barbecue sauce. Mayonnaise. The items listed above may not be a complete list of foods and beverages that are not recommended. Contact a dietitian for more information. Where to find more information American Diabetes Association: www.diabetes.org Summary You may need to make diet and lifestyle changes to help prevent the onset of diabetes. These changes can help you control blood sugar, improve cholesterol levels, and manage blood pressure. Set weight loss goals with help from your health care team. It is recommended that most people with prediabetes lose 7% of their body weight. Consider following a Mediterranean diet. This includes eating plenty of fresh fruits and vegetables, whole grains, beans, nuts, seeds, fish, and low-fat dairy, and using olive oil instead of other fats. This information is not intended to replace advice given to you by your health care provider. Make sure you discuss any questions you have with your health care provider. Document Revised: 05/19/2019 Document Reviewed: 05/19/2019 Elsevier Patient Education  2024 Elsevier Inc.  

## 2022-08-25 NOTE — Progress Notes (Signed)
Patient ID: Brandy Stevenson, female    DOB: [birth year]  MRN: [id number]  CC: Hypertension (Htn f/u. Med refills. /Intermittent pain from R lower back to R thigh/Yes to referral for colonscopy)   Subjective: Brandy Stevenson is a 67 y.o. female who presents for chronic ds management Her concerns today include:  Hx of PAD (axillobifemoral bypass graft BL 10/2021 ), HTN, HL, former tob dep, LT breast CA (lumpectomy and XRT 2009), GERD, COPD, Trigeminal neuralgia RT, dep/anxiety, OA shoulders   HTN:  on Norvasc 5 mg, Hydralazine 25mg  TID, Coreg 6.25 mg BID and Spir 25 mg daily Limits salt.  No CP/SOB  HL/PAD:  taking and tolerating Lipitor 80 mg daily.  No longer on Pletal  COPD:  using Advair BID; Spiriva added on last visit to help decrease use of Albuterol.  Using Spiriva once a day and Albuterol 2x/day down from 4x/day. Needs refill on nystatin solution for recurrent oral thrush.  Rinse her mouth thoroughly after each use of Advair.  PAD:  Has f/u appt 09/18/2022 with vascular surgeon Saw the general surgeon regarding the knot under LT breast  Obesity/PreDM:   Results for orders placed or performed in visit on 08/25/22  POCT glucose (manual entry)  Result Value Ref Range   POC Glucose 96 70 - 99 mg/dl  POCT glycosylated hemoglobin (Hb A1C)  Result Value Ref Range   Hemoglobin A1C     HbA1c POC (<> result, manual entry)     HbA1c, POC (prediabetic range) 6.0 5.7 - 6.4 %   HbA1c, POC (controlled diabetic range)    gained 6 lbs since 03/2022.  Has a lot of dogs in her area.  Afraid to walk in her neighborhood.  Just got an exercise bike which she plans to start using.   C/o pain in RT lower back that radiates around to lateral hip and lower abdomen. Started 1 wk ago and lasted 3 days.   No radiation down leg/numbness/tingling. -Had to use walker last wk. Use Biofreeze which helped a lot. Just about resolved now.  No dysuria/abn vaginal dischg/N/V.  No affected by food.  No  fever.    HM:  hx of colon polyps. Due for repeat by Dr. Bosie Clos. Patient Active Problem List   Diagnosis Date Noted   Prediabetes 06/15/2021   Influenza vaccine refused 02/03/2020   Resistant hypertension 01/20/2020   Tubular adenoma of colon 04/27/2019   Cervical radiculopathy 10/01/2018   Hypertensive retinopathy 09/08/2018   Seasonal allergies 06/29/2018   Aortoiliac occlusive disease (HCC) 11/04/2017   DOE (dyspnea on exertion) 11/03/2017   Chronic chest wall pain 11/03/2017   Ichthyosis 10/06/2017   Former smoker 04/07/2017   Primary osteoarthritis of right shoulder 04/07/2017   Chronic midline low back pain without sciatica 04/07/2017   Hiatal hernia 05/01/2016   DJD (degenerative joint disease) of cervical spine 04/17/2016   History of breast cancer 02/18/2016   Foot callus 02/18/2016   Anxiety and depression 05/22/2015   Insomnia 03/15/2015   Unspecified vitamin D deficiency 04/08/2013   Hyperlipidemia with target LDL less than 70 04/08/2013   Atherosclerotic PVD with intermittent claudication (HCC) 03/22/2013   Accelerated hypertension 02/23/2013   GERD (gastroesophageal reflux disease) 02/23/2013   COPD (chronic obstructive pulmonary disease) (HCC) 02/23/2013     Current Outpatient Medications on File Prior to Visit  Medication Sig Dispense Refill   acetaminophen (TYLENOL) 500 MG tablet Take 1,000 mg by mouth every 8 (eight) hours.     albuterol (  PROAIR HFA) 108 (90 Base) MCG/ACT inhaler Inhale 2 puffs into the lungs every 6 (six) hours as needed for wheezing or shortness of breath. 6.7 g 3   aspirin EC 81 MG tablet Take 1 tablet (81 mg total) by mouth daily. 90 tablet 3   atorvastatin (LIPITOR) 80 MG tablet TAKE 1 TABLET BY MOUTH AT  BEDTIME 100 tablet 2   carvedilol (COREG) 6.25 MG tablet TAKE 1 TABLET (6.25 MG TOTAL) BY MOUTH 2 (TWO) TIMES DAILY. 60 tablet 0   fluticasone-salmeterol (ADVAIR) 250-50 MCG/ACT AEPB Inhale 1 puff into the lungs in the morning and at  bedtime. 60 each 2   gabapentin (NEURONTIN) 300 MG capsule TAKE 1 CAPSULE BY MOUTH AT  BEDTIME 100 capsule 2   hydrALAZINE (APRESOLINE) 25 MG tablet Take 1 tablet (25 mg total) by mouth 3 (three) times daily. 270 tablet 0   meloxicam (MOBIC) 15 MG tablet TAKE 1 TABLET BY MOUTH DAILY 100 tablet 2   Menthol, Topical Analgesic, (BIOFREEZE) 5 % PTCH Apply 0.5 patches topically daily as needed (pain).     omeprazole (PRILOSEC) 40 MG capsule TAKE 1 CAPSULE BY MOUTH DAILY 100 capsule 0   potassium chloride (KLOR-CON) 10 MEQ tablet Take 2 tablets (20 mEq total) by mouth daily. 180 tablet 1   spironolactone (ALDACTONE) 25 MG tablet Take 1 tablet (25 mg total) by mouth daily. 90 tablet 3   tiotropium (SPIRIVA HANDIHALER) 18 MCG inhalation capsule Place 1 capsule (18 mcg total) into inhaler and inhale daily. 30 capsule 12   diclofenac Sodium (VOLTAREN) 1 % GEL Apply 4 g topically 4 (four) times daily. (Patient not taking: Reported on 08/25/2022) 100 g 1   No current facility-administered medications on file prior to visit.    Allergies  Allergen Reactions   Vicodin [Hydrocodone-Acetaminophen] Nausea And Vomiting and Other (See Comments)    Sweating   Bactrim [Sulfamethoxazole-Trimethoprim] Nausea And Vomiting    HA    Incruse Ellipta [Umeclidinium Bromide] Other (See Comments)    Dry mouth   Tramadol Nausea And Vomiting   Wellbutrin [Bupropion] Other (See Comments)    Made me feel bad and sleepy    Social History   Socioeconomic History   Marital status: Married    Spouse name: Not on file   Number of children: Not on file   Years of education: Not on file   Highest education level: Not on file  Occupational History   Not on file  Tobacco Use   Smoking status: Former    Packs/day: 0.25    Years: 40.00    Additional pack years: 0.00    Total pack years: 10.00    Types: Cigarettes    Quit date: 02/08/2017    Years since quitting: 5.5    Passive exposure: Never   Smokeless tobacco:  Never  Vaping Use   Vaping Use: Never used  Substance and Sexual Activity   Alcohol use: No   Drug use: No   Sexual activity: Yes  Other Topics Concern   Not on file  Social History Narrative   Not on file   Social Determinants of Health   Financial Resource Strain: Low Risk  (01/10/2022)   Overall Financial Resource Strain (CARDIA)    Difficulty of Paying Living Expenses: Not hard at all  Food Insecurity: No Food Insecurity (01/10/2022)   Hunger Vital Sign    Worried About Running Out of Food in the Last Year: Never true    Ran Out of Food  in the Last Year: Never true  Transportation Needs: No Transportation Needs (01/10/2022)   PRAPARE - Administrator, Civil Service (Medical): No    Lack of Transportation (Non-Medical): No  Physical Activity: Insufficiently Active (01/10/2022)   Exercise Vital Sign    Days of Exercise per Week: 2 days    Minutes of Exercise per Session: 10 min  Stress: No Stress Concern Present (01/10/2022)   [employer] of Occupational Health - Occupational Stress Questionnaire    Feeling of Stress : Not at all  Social Connections: Socially Integrated (01/10/2022)   Social Connection and Isolation Panel [NHANES]    Frequency of Communication with Friends and Family: More than three times a week    Frequency of Social Gatherings with Friends and Family: Three times a week    Attends Religious Services: More than 4 times per year    Active Member of Clubs or Organizations: Yes    Attends Banker Meetings: More than 4 times per year    Marital Status: Married  Catering manager Violence: Not At Risk (01/10/2022)   Humiliation, Afraid, Rape, and Kick questionnaire    Fear of Current or Ex-Partner: No    Emotionally Abused: No    Physically Abused: No    Sexually Abused: No    Family History  Problem Relation Age of Onset   Hyperlipidemia Mother    Hypertension Mother    Heart disease Mother        Atrial Fib.    Hyperlipidemia Sister    Hypertension Sister    Heart disease Sister        Before age 42,  CHF   Heart attack Brother    Hyperlipidemia Brother    Hypertension Brother    Heart disease Brother    Hypertension Daughter     Past Surgical History:  Procedure Laterality Date   APPENDECTOMY  1980   AXILLARY-FEMORAL BYPASS GRAFT Bilateral 10/29/2021   Procedure: LEFT AXILLOBIFEMORAL BYPASS GRAFT;  Surgeon: Chuck Hint, MD;  Location: [facility] OR;  Service: Vascular;  Laterality: Bilateral;   BREAST LUMPECTOMY Left    s/p radiation therapy    BREAST SURGERY     COLONOSCOPY  07/2015   COLONOSCOPY WITH PROPOFOL N/A 06/26/2015   Procedure: COLONOSCOPY WITH PROPOFOL;  Surgeon: Charolett Bumpers, MD;  Location: [facility] ENDOSCOPY;  Service: Endoscopy;  Laterality: N/A;   CT CTA CORONARY W/CA SCORE W/CM &/OR WO/CM  12/10/2017   Coronary calcium score is 0.  No evidence of CAD.   PERIPHERAL VASCULAR CATHETERIZATION N/A 10/22/2015   Procedure: Abdominal Aortogram w/Lower Extremity;  Surgeon: Chuck Hint, MD;  Location: [facility] INVASIVE CV LAB: Aortic arch widely patent as far as innominate artery, right subclavian artery, right vertebral artery and right, common carotid.  Bilateral single renal arteries - Nonocclusive disease. Infrarenal aorta 100% - bilateral common Iliac 100% --> B Ex Iliacs fill via Int Iliac. Leg OK   TRANSTHORACIC ECHOCARDIOGRAM  08/2017   EF 60-65%. No RWMA.  Normal DF.  Normal valves.    TUBAL LIGATION  1991    ROS: Review of Systems Negative except as stated above  PHYSICAL EXAM: BP 126/79 (BP Location: Left Arm, Patient Position: Sitting, Cuff Size: Normal)   Pulse 62   Temp 97.8 F (36.6 C) (Oral)   Ht 5\' 5"  (1.651 m)   Wt 196 lb (88.9 kg)   SpO2 99%   BMI 32.62 kg/m   Wt Readings from Last 3 Encounters:  08/25/22 196 lb (88.9 kg)  04/17/22 191 lb (86.6 kg)  04/01/22 190 lb 9.6 oz (86.5 kg)    Physical Exam  General appearance - alert, well appearing,  obese older African-American female and in no distress Mental status - normal mood, behavior, speech, dress, motor activity, and thought processes Mouth: No oral thrush noted at this time.   Neck - supple, no significant adenopathy Chest - clear to auscultation, no wheezes, rales or rhonchi, symmetric air entry Heart - normal rate, regular rhythm, normal S1, S2, no murmurs, rubs, clicks or gallops Extremities - peripheral pulses normal, no pedal edema, no clubbing or cyanosis MSK: No tenderness on palpation of the lumbar spine.  Power in the lower extremities 5/5 bilaterally.  Good flexion extension and rotation of the right hip.  She ambulates unassisted.     Latest Ref Rng & Units 10/30/2021    5:30 AM 10/22/2021    9:24 AM 08/20/2021    9:29 AM  CMP  Glucose 70 - 99 mg/dL 865  92  90   BUN 8 - 23 mg/dL 10  10  12    Creatinine 0.44 - 1.00 mg/dL 7.84  6.96  2.95   Sodium 135 - 145 mmol/L 137  139  141   Potassium 3.5 - 5.1 mmol/L 3.7  3.5  4.3   Chloride 98 - 111 mmol/L 100  107  104   CO2 22 - 32 mmol/L 24  23  21    Calcium 8.9 - 10.3 mg/dL 9.0  9.0  9.5   Total Protein 6.5 - 8.1 g/dL  7.6    Total Bilirubin 0.3 - 1.2 mg/dL  0.4    Alkaline Phos 38 - 126 U/L  66    AST 15 - 41 U/L  20    ALT 0 - 44 U/L  25     Lipid Panel     Component Value Date/Time   CHOL 117 10/30/2021 0530   CHOL 150 06/14/2021 1458   TRIG 49 10/30/2021 0530   HDL 54 10/30/2021 0530   HDL 67 06/14/2021 1458   CHOLHDL 2.2 10/30/2021 0530   VLDL 10 10/30/2021 0530   LDLCALC 53 10/30/2021 0530   LDLCALC 65 06/14/2021 1458    CBC    Component Value Date/Time   WBC 6.1 12/12/2021 1501   WBC 8.2 11/01/2021 1206   RBC 3.57 (L) 12/12/2021 1501   RBC 2.94 (L) 11/01/2021 1206   HGB 11.3 12/12/2021 1501   HGB 13.8 06/12/2008 0939   HCT 34.4 12/12/2021 1501   HCT 40.2 06/12/2008 0939   PLT 229 12/12/2021 1501   MCV 96 12/12/2021 1501   MCV 90.1 06/12/2008 0939   MCH 31.7 12/12/2021 1501   MCH 32.7  11/01/2021 1206   MCHC 32.8 12/12/2021 1501   MCHC 32.9 11/01/2021 1206   RDW 12.7 12/12/2021 1501   RDW 13.9 06/12/2008 0939   LYMPHSABS 1.8 08/07/2021 0940   LYMPHSABS 0.4 (L) 03/10/2017 1335   LYMPHSABS 1.5 06/12/2008 0939   MONOABS 0.5 08/07/2021 0940   MONOABS 0.6 06/12/2008 0939   EOSABS 0.1 08/07/2021 0940   EOSABS 0.1 03/10/2017 1335   BASOSABS 0.1 08/07/2021 0940   BASOSABS 0.0 03/10/2017 1335   BASOSABS 0.0 06/12/2008 0939    ASSESSMENT AND PLAN: 1. Essential hypertension At goal.  Continue medications listed above. - amLODipine (NORVASC) 5 MG tablet; Take 1 tablet (5 mg total) by mouth daily.  Dispense: 90 tablet; Refill: 1  2. Chronic obstructive  pulmonary disease, unspecified COPD type (HCC) Better control.  Continue Spiriva and Advair.  Continue albuterol as needed.  Will send refill on nystatin solution  3. Obesity (BMI 30.0-34.9) 4. Prediabetes Discussed on encourage healthy eating habits.  Printed information given. Encouraged her to start using her exercise bike - POCT glucose (manual entry) - POCT glycosylated hemoglobin (Hb A1C)  5. History of colon polyps - Ambulatory referral to Gastroenterology  6. Screening for colon cancer - Ambulatory referral to Gastroenterology     Patient was given the opportunity to ask questions.  Patient verbalized understanding of the plan and was able to repeat key elements of the plan.   This documentation was completed using Paediatric nurse.  Any transcriptional errors are unintentional.  Orders Placed This Encounter  Procedures   Ambulatory referral to Gastroenterology   POCT glucose (manual entry)   POCT glycosylated hemoglobin (Hb A1C)     Requested Prescriptions   Signed Prescriptions Disp Refills   amLODipine (NORVASC) 5 MG tablet 90 tablet 1    Sig: Take 1 tablet (5 mg total) by mouth daily.   nystatin (MYCOSTATIN) 100000 UNIT/ML suspension 100 mL 0    Sig: SWISH AND SWALLOW 5 MLS  (500,000 UNITS TOTAL) BY MOUTH 4 (FOUR) TIMES DAILY.    Return in about 4 months (around 12/25/2022).  Jonah Blue, MD, FACP

## 2022-09-08 NOTE — Progress Notes (Unsigned)
Cardiology Clinic Note   Patient Name: Brandy Stevenson [facility] Date of Encounter: 09/08/2022  Primary Care Provider:  Marcine Matar, MD Primary Cardiologist:  Bryan Lemma, MD  Patient Profile    Brandy Stevenson presents to the clinic today for follow-up evaluation of her atherosclerosis, resistant hypertension, and preoperative cardiac evaluation.  Past Medical History    Past Medical History:  Diagnosis Date   Arthritis    back problems to be evaluated by neurology next month   Cancer [facility]) 2009   L, Breast, radiation and surgery.   COPD (chronic obstructive pulmonary disease) (HCC)    Depression    GERD (gastroesophageal reflux disease)    Hyperlipemia    Hypertension    Leriche syndrome (HCC)    Infrarenal aortic occlusion extending to bilateral internal iliac arteries.  External iliacs fill via collaterals from lumbar arteries to hypogastric/internal iliac arteries -minimal disease on the right side and no disease on the left side leg arteries distally.   Peripheral vascular disease (HCC) 2013   Pre-diabetes    Past Surgical History:  Procedure Laterality Date   APPENDECTOMY  1980   AXILLARY-FEMORAL BYPASS GRAFT Bilateral 10/29/2021   Procedure: LEFT AXILLOBIFEMORAL BYPASS GRAFT;  Surgeon: Chuck Hint, MD;  Location: [facility] OR;  Service: Vascular;  Laterality: Bilateral;   BREAST LUMPECTOMY Left    s/p radiation therapy    BREAST SURGERY     COLONOSCOPY  07/2015   COLONOSCOPY WITH PROPOFOL N/A 06/26/2015   Procedure: COLONOSCOPY WITH PROPOFOL;  Surgeon: Charolett Bumpers, MD;  Location: [facility] ENDOSCOPY;  Service: Endoscopy;  Laterality: N/A;   CT CTA CORONARY W/CA SCORE W/CM &/OR WO/CM  12/10/2017   Coronary calcium score is 0.  No evidence of CAD.   PERIPHERAL VASCULAR CATHETERIZATION N/A 10/22/2015   Procedure: Abdominal Aortogram w/Lower Extremity;  Surgeon: Chuck Hint, MD;  Location: [facility] INVASIVE CV LAB: Aortic arch widely patent as far  as innominate artery, right subclavian artery, right vertebral artery and right, common carotid.  Bilateral single renal arteries - Nonocclusive disease. Infrarenal aorta 100% - bilateral common Iliac 100% --> B Ex Iliacs fill via Int Iliac. Leg OK   TRANSTHORACIC ECHOCARDIOGRAM  08/2017   EF 60-65%. No RWMA.  Normal DF.  Normal valves.    TUBAL LIGATION  1991    Allergies  Allergies  Allergen Reactions   Vicodin [Hydrocodone-Acetaminophen] Nausea And Vomiting and Other (See Comments)    Sweating   Bactrim [Sulfamethoxazole-Trimethoprim] Nausea And Vomiting    HA    Incruse Ellipta [Umeclidinium Bromide] Other (See Comments)    Dry mouth   Tramadol Nausea And Vomiting   Wellbutrin [Bupropion] Other (See Comments)    Made me feel bad and sleepy    History of Present Illness    Brandy Stevenson has a PMH of atherosclerosis, PVD with intermittent claudication, hyperlipidemia, and resistant hypertension.  Her PMH also includes COPD and previous breast CA.  She was seen in follow-up by Dr. Herbie Baltimore on 01/20/2020.  During that time her blood pressure was elevated.  She was started on hydralazine twice daily along with her amlodipine, and lisinopril.  Her PCP was following her hyperlipidemia.  She reported that she has stopped her Pletal and it was recommended that it be restarted.  She was instructed to follow-up with vascular surgery.  She reported that she felt fairly well.  She was not very active due to not feeling safe in her neighborhood because of all the dogs.  She was doing leg exercises at home.  She was elevating her feet when she noted edema.  She reported that her blood pressure was fairly well controlled at home.  She was trying to lose weight and had been adjusting her diet.  Her COPD was at baseline with no resting dyspnea.  She reported bilateral chest ache with deep inspiration.  She did not have exertional chest discomfort.  She presents to the clinic today for follow-up  evaluation and preoperative cardiac evaluation.  She states she noticed increased pain around 3 to 4 weeks ago.  She has been visiting her aunt and has been out unable to walk with her due to pain in bilateral lower extremities.  We reviewed recommendations from vein and vascular surgery.  She is unable to perform traditional stress testing.  I will order a coronary CTA, give her a salty 6 diet sheet, have her increase her physical activity as tolerated, give high-fiber diet information, and recommended diclofenac gel.  We will have her follow-up in 12 months.  Today she denies chest pain, shortness of breath, lower extremity edema, fatigue, palpitations, melena, hematuria, hemoptysis, diaphoresis, presyncope, syncope, orthopnea, and PND.   Home Medications    Prior to Admission medications   Medication Sig Start Date End Date Taking? Authorizing Provider  acetaminophen (TYLENOL) 500 MG tablet Take 1,000 mg by mouth every 8 (eight) hours.    [provider]  albuterol (PROAIR HFA) 108 (90 Base) MCG/ACT inhaler Inhale 2 puffs into the lungs every 6 (six) hours as needed for wheezing or shortness of breath. Patient taking differently: Inhale 2 puffs into the lungs. 2 puffs 2-4 times a day as needed for wheezing 06/14/21   Marcine Matar, MD  amLODipine (NORVASC) 10 MG tablet TAKE 1 TABLET BY MOUTH AT  BEDTIME Patient taking differently: Take 10 mg by mouth daily. 06/24/21   Marcine Matar, MD  aspirin EC 81 MG tablet Take 1 tablet (81 mg total) by mouth daily. 10/03/16   Funches, Gerilyn Nestle, MD  atorvastatin (LIPITOR) 40 MG tablet TAKE 1 TABLET BY MOUTH  DAILY Patient taking differently: Take 40 mg by mouth at bedtime. 06/24/21   Marcine Matar, MD  carvedilol (COREG) 6.25 MG tablet TAKE 1 TABLET (6.25 MG TOTAL) BY MOUTH 2 (TWO) TIMES DAILY. Patient taking differently: Take 6.25 mg by mouth 2 (two) times daily. 07/12/21 07/12/22  Marcine Matar, MD  cilostazol (PLETAL) 50 MG tablet  TAKE 1 TABLET BY MOUTH TWICE  DAILY Patient taking differently: Take 50 mg by mouth 2 (two) times daily. 06/26/21   Marcine Matar, MD  fluticasone-salmeterol (ADVAIR) 250-50 MCG/ACT AEPB Inhale 1 puff into the lungs in the morning and at bedtime. 06/14/21   Marcine Matar, MD  gabapentin (NEURONTIN) 300 MG capsule TAKE 1 CAPSULE BY MOUTH AT  BEDTIME Patient taking differently: Take 300 mg by mouth at bedtime. 06/24/21   Marcine Matar, MD  hydrALAZINE (APRESOLINE) 25 MG tablet Take 1 tablet (25 mg total) by mouth 3 (three) times daily. Patient taking differently: Take 25 mg by mouth in the morning and at bedtime. 06/14/21   Marcine Matar, MD  lisinopril (ZESTRIL) 40 MG tablet TAKE 1 TABLET BY MOUTH  DAILY Patient taking differently: Take 40 mg by mouth daily. 06/24/21   Marcine Matar, MD  meloxicam (MOBIC) 15 MG tablet TAKE 1 TABLET BY MOUTH  DAILY Patient taking differently: Take 15 mg by mouth daily. 06/24/21   Jonah Blue  B, MD  Menthol, Topical Analgesic, (BIOFREEZE EX) Apply 1 patch topically once.    [provider]  Menthol, Topical Analgesic, (BIOFREEZE ROLL-ON EX) Apply 1 application. topically at bedtime as needed (back pain).    [provider]  nystatin (MYCOSTATIN) 100000 UNIT/ML suspension TAKE 5 MLS (500,000 UNITS TOTAL) BY MOUTH 4 (FOUR) TIMES DAILY. SWISH AND SWALLOW 08/16/21   Marcine Matar, MD  omeprazole (PRILOSEC) 40 MG capsule TAKE 1 CAPSULE BY MOUTH  DAILY Patient taking differently: Take 40 mg by mouth daily. 06/24/21   Marcine Matar, MD  ondansetron (ZOFRAN-ODT) 4 MG disintegrating tablet Take 1 tablet (4 mg total) by mouth every 8 (eight) hours as needed for nausea or vomiting. 08/07/21   Gloris Manchester, MD  oxyCODONE-acetaminophen (PERCOCET/ROXICET) 5-325 MG tablet Take 1 tablet by mouth every 8 (eight) hours as needed for up to 15 doses for severe pain. 08/07/21   Gloris Manchester, MD  potassium chloride (KLOR-CON) 10 MEQ tablet Take 2  tablets (20 mEq total) by mouth once daily. 08/16/21   Marcine Matar, MD  spironolactone (ALDACTONE) 25 MG tablet Take 1 tablet (25 mg total) by mouth daily. 06/14/21   Marcine Matar, MD    Family History    Family History  Problem Relation Age of Onset   Hyperlipidemia Mother    Hypertension Mother    Heart disease Mother        Atrial Fib.   Hyperlipidemia Sister    Hypertension Sister    Heart disease Sister        Before age 60,  CHF   Heart attack Brother    Hyperlipidemia Brother    Hypertension Brother    Heart disease Brother    Hypertension Daughter    She indicated that her mother is alive. She indicated that her father is deceased. She indicated that her sister is alive. She indicated that both of her brothers are deceased. She indicated that her daughter is alive.  Social History    Social History   Socioeconomic History   Marital status: Married    Spouse name: Not on file   Number of children: Not on file   Years of education: Not on file   Highest education level: Not on file  Occupational History   Not on file  Tobacco Use   Smoking status: Former    Packs/day: 0.25    Years: 40.00    Additional pack years: 0.00    Total pack years: 10.00    Types: Cigarettes    Quit date: 02/08/2017    Years since quitting: 5.5    Passive exposure: Never   Smokeless tobacco: Never  Vaping Use   Vaping Use: Never used  Substance and Sexual Activity   Alcohol use: No   Drug use: No   Sexual activity: Yes  Other Topics Concern   Not on file  Social History Narrative   Not on file   Social Determinants of Health   Financial Resource Strain: Low Risk  (01/10/2022)   Overall Financial Resource Strain (CARDIA)    Difficulty of Paying Living Expenses: Not hard at all  Food Insecurity: No Food Insecurity (01/10/2022)   Hunger Vital Sign    Worried About Running Out of Food in the Last Year: Never true    Ran Out of Food in the Last Year: Never true   Transportation Needs: No Transportation Needs (01/10/2022)   PRAPARE - Administrator, Civil Service (Medical):  No    Lack of Transportation (Non-Medical): No  Physical Activity: Insufficiently Active (01/10/2022)   Exercise Vital Sign    Days of Exercise per Week: 2 days    Minutes of Exercise per Session: 10 min  Stress: No Stress Concern Present (01/10/2022)   [employer] of Occupational Health - Occupational Stress Questionnaire    Feeling of Stress : Not at all  Social Connections: Socially Integrated (01/10/2022)   Social Connection and Isolation Panel [NHANES]    Frequency of Communication with Friends and Family: More than three times a week    Frequency of Social Gatherings with Friends and Family: Three times a week    Attends Religious Services: More than 4 times per year    Active Member of Clubs or Organizations: Yes    Attends Banker Meetings: More than 4 times per year    Marital Status: Married  Catering manager Violence: Not At Risk (01/10/2022)   Humiliation, Afraid, Rape, and Kick questionnaire    Fear of Current or Ex-Partner: No    Emotionally Abused: No    Physically Abused: No    Sexually Abused: No     Review of Systems    General:  No chills, fever, night sweats or weight changes.  Cardiovascular:  No chest pain, dyspnea on exertion, edema, orthopnea, palpitations, paroxysmal nocturnal dyspnea. Dermatological: No rash, lesions/masses Respiratory: No cough, dyspnea Urologic: No hematuria, dysuria Abdominal:   No nausea, vomiting, diarrhea, bright red blood per rectum, melena, or hematemesis Neurologic:  No visual changes, wkns, changes in mental status. All other systems reviewed and are otherwise negative except as noted above.  Physical Exam    VS:  There were no vitals taken for this visit. , BMI There is no height or weight on file to calculate BMI. GEN: Well nourished, well developed, in no acute distress. HEENT:  normal. Neck: Supple, no JVD, carotid bruits, or masses. Cardiac: RRR, no murmurs, rubs, or gallops. No clubbing, cyanosis, edema.  Radials 2+/DP/PT 1+ and equal bilaterally.  Respiratory:  Respirations regular and unlabored, clear to auscultation bilaterally. GI: Soft, nontender, nondistended, BS + x 4. MS: no deformity or atrophy. Skin: warm and dry, no rash. Neuro:  Strength and sensation are intact. Psych: Normal affect.  Accessory Clinical Findings    Recent Labs: 10/22/2021: ALT 25 10/30/2021: BUN 10; Creatinine, Ser 0.84; Potassium 3.7; Sodium 137 12/12/2021: Hemoglobin 11.3; Platelets 229   Recent Lipid Panel    Component Value Date/Time   CHOL 117 10/30/2021 0530   CHOL 150 06/14/2021 1458   TRIG 49 10/30/2021 0530   HDL 54 10/30/2021 0530   HDL 67 06/14/2021 1458   CHOLHDL 2.2 10/30/2021 0530   VLDL 10 10/30/2021 0530   LDLCALC 53 10/30/2021 0530   LDLCALC 65 06/14/2021 1458    ECG personally reviewed by me today-normal sinus rhythm 62 bpm no ST or T wave deviation.- No acute changes  Echocardiogram 09/07/2017  Study Conclusions   - Left ventricle: The cavity size was normal. Systolic function was    normal. The estimated ejection fraction was in the range of 60%    to 65%. Wall motion was normal; there were no regional wall    motion abnormalities. Left ventricular diastolic function    parameters were normal.   Assessment & Plan   1.  Resistant hypertension-BP today 136/78.  Well-controlled at home. Continue amlodipine, lisinopril, hydralazine, spironolactone, carvedilol Heart healthy low-sodium diet-salty 6 given Increase physical activity as tolerated Order  BMP  Hyperlipidemia-LDL 65 on 06/14/2021. Continue atorvastatin Heart healthy low-sodium high-fiber diet Increase physical activity as tolerated Follows with PCP  PVD-continues to note occasional periods of intermittent claudication.  Has restarted Pletal.  Was seen in the emergency department on  08/07/2021 and reporting bilateral leg pain.  Negative DVT, CTA with runoff showed chronic infra renal aortic occlusion extending into bilateral CIA.  Exercise challenge showed cramping in both calves after 1 lap.  Cramps resolved with rest.  ABIs unchanged from 3/22.  No nonhealing sores.  Motor and sensory intact.  Scheduled for BUE arterial duplex.  Possibly may have femoral bypass versus aortofemoral bypass versus conservative management. Continue Pletal Increase physical activity as tolerated  Preoperative cardiac evaluation-vascular surgery as discussed above. Patient is not able to complete greater than 4 METS of physical activity. Order Coronary CTA. Order CBC, BMP   Follow-up with Dr. Herbie Baltimore or me in 12 months.   Thomasene Ripple. Kahlia Lagunes NP-C    09/08/2022, 10:41 AM [facility] [address] Office [phone] Fax [phone]  Notice: This dictation was prepared with Dragon dictation along with smaller phrase technology. Any transcriptional errors that result from this process are unintentional and may not be corrected upon review.  I spent 14 minutes examining this patient, reviewing medications, and using patient centered shared decision making involving her cardiac care.  Prior to her visit I spent greater than 20 minutes reviewing her past medical history,  medications, and prior cardiac tests.

## 2022-09-09 ENCOUNTER — Other Ambulatory Visit: Payer: Self-pay | Admitting: Internal Medicine

## 2022-09-09 DIAGNOSIS — I10 Essential (primary) hypertension: Secondary | ICD-10-CM

## 2022-09-10 ENCOUNTER — Other Ambulatory Visit: Payer: Self-pay

## 2022-09-10 MED ORDER — CARVEDILOL 6.25 MG PO TABS
ORAL_TABLET | Freq: Two times a day (BID) | ORAL | 1 refills | Status: DC
Start: 2022-09-10 — End: 2023-03-11
  Filled 2022-09-10: qty 180, 90d supply, fill #0
  Filled 2022-11-28: qty 180, 90d supply, fill #1

## 2022-09-11 ENCOUNTER — Ambulatory Visit: Payer: 59 | Attending: General Practice | Admitting: General Practice

## 2022-09-11 ENCOUNTER — Other Ambulatory Visit: Payer: Self-pay

## 2022-09-11 ENCOUNTER — Encounter: Payer: Self-pay | Admitting: General Practice

## 2022-09-11 VITALS — BP 122/76 | HR 68 | Ht 65.0 in | Wt 199.8 lb

## 2022-09-11 DIAGNOSIS — E785 Hyperlipidemia, unspecified: Secondary | ICD-10-CM | POA: Diagnosis not present

## 2022-09-11 DIAGNOSIS — I1A0 Resistant hypertension: Secondary | ICD-10-CM | POA: Diagnosis not present

## 2022-09-11 DIAGNOSIS — I739 Peripheral vascular disease, unspecified: Secondary | ICD-10-CM | POA: Diagnosis not present

## 2022-09-11 NOTE — Patient Instructions (Signed)
Medication Instructions:  The current medical regimen is effective;  continue present plan and medications as directed. Please refer to the Current Medication list given to you today.  *If you need a refill on your cardiac medications before your next appointment, please call your pharmacy*  Lab Work: NONR If you have labs (blood work) drawn today and your tests are completely normal, you will receive your results only by: MyChart Message (if you have MyChart) OR A paper copy in the mail If you have any lab test that is abnormal or we need to change your treatment, we will call you to review the results.  Other Instructions INCREASE PHYSICAL ACTIVITY AS TOLERATED  PLEASE READ AND FOLLOW ATTACHED INCREASED FIBER DIET  Follow-Up: At [facility], you and your health needs are our priority.  As part of our continuing mission to provide you with exceptional heart care, we have created designated Provider Care Teams.  These Care Teams include your primary Cardiologist (physician) and Advanced Practice Providers (APPs -  Physician Assistants and Nurse Practitioners) who all work together to provide you with the care you need, when you need it.   Your next appointment:   12 month(s)  Provider:   Bryan Lemma, MD  or Edd Fabian, FNP          High-Fiber Eating Plan Fiber, also called dietary fiber, is a type of carbohydrate. It is found foods such as fruits, vegetables, whole grains, and beans. A high-fiber diet can have many health benefits. Your health care provider may recommend a high-fiber diet to help: Prevent constipation. Fiber can make your bowel movements more regular. Lower your cholesterol. Relieve the following conditions: Inflammation of veins in the anus (hemorrhoids). Inflammation of specific areas of the digestive tract (uncomplicated diverticulosis). A problem of the large intestine, also called the colon, that sometimes causes pain and diarrhea (irritable  bowel syndrome, or IBS). Prevent overeating as part of a weight-loss plan. Prevent heart disease, type 2 diabetes, and certain cancers. What are tips for following this plan? Reading food labels  Check the nutrition facts label on food products for the amount of dietary fiber. Choose foods that have 5 grams of fiber or more per serving. The goals for recommended daily fiber intake include: Men (age 70 or younger): 34-38 g. Men (over age 30): 28-34 g. Women (age 66 or younger): 25-28 g. Women (over age 32): 22-25 g. Your daily fiber goal is _____________ g. Shopping Choose whole fruits and vegetables instead of processed forms, such as apple juice or applesauce. Choose a wide variety of high-fiber foods such as avocados, lentils, oats, and kidney beans. Read the nutrition facts label of the foods you choose. Be aware of foods with added fiber. These foods often have high sugar and sodium amounts per serving. Cooking Use whole-grain flour for baking and cooking. Cook with brown rice instead of white rice. Meal planning Start the day with a breakfast that is high in fiber, such as a cereal that contains 5 g of fiber or more per serving. Eat breads and cereals that are made with whole-grain flour instead of refined flour or white flour. Eat brown rice, bulgur wheat, or millet instead of white rice. Use beans in place of meat in soups, salads, and pasta dishes. Be sure that half of the grains you eat each day are whole grains. General information You can get the recommended daily intake of dietary fiber by: Eating a variety of fruits, vegetables, grains, nuts, and  beans. Taking a fiber supplement if you are not able to take in enough fiber in your diet. It is better to get fiber through food than from a supplement. Gradually increase how much fiber you consume. If you increase your intake of dietary fiber too quickly, you may have bloating, cramping, or gas. Drink plenty of water to help  you digest fiber. Choose high-fiber snacks, such as berries, raw vegetables, nuts, and popcorn. What foods should I eat? Fruits Berries. Pears. Apples. Oranges. Avocado. Prunes and raisins. Dried figs. Vegetables Sweet potatoes. Spinach. Kale. Artichokes. Cabbage. Broccoli. Cauliflower. Green peas. Carrots. Squash. Grains Whole-grain breads. Multigrain cereal. Oats and oatmeal. Brown rice. Barley. Bulgur wheat. Millet. Quinoa. Bran muffins. Popcorn. Rye wafer crackers. Meats and other proteins Navy beans, kidney beans, and pinto beans. Soybeans. Split peas. Lentils. Nuts and seeds. Dairy Fiber-fortified yogurt. Beverages Fiber-fortified soy milk. Fiber-fortified orange juice. Other foods Fiber bars. The items listed above may not be a complete list of recommended foods and beverages. Contact a dietitian for more information. What foods should I avoid? Fruits Fruit juice. Cooked, strained fruit. Vegetables Fried potatoes. Canned vegetables. Well-cooked vegetables. Grains White bread. Pasta made with refined flour. White rice. Meats and other proteins Fatty cuts of meat. Fried chicken or fried fish. Dairy Milk. Yogurt. Cream cheese. Sour cream. Fats and oils Butters. Beverages Soft drinks. Other foods Cakes and pastries. The items listed above may not be a complete list of foods and beverages to avoid. Talk with your dietitian about what choices are best for you. Summary Fiber is a type of carbohydrate. It is found in foods such as fruits, vegetables, whole grains, and beans. A high-fiber diet has many benefits. It can help to prevent constipation, lower blood cholesterol, aid weight loss, and reduce your risk of heart disease, diabetes, and certain cancers. Increase your intake of fiber gradually. Increasing fiber too quickly may cause cramping, bloating, and gas. Drink plenty of water while you increase the amount of fiber you consume. The best sources of fiber include whole  fruits and vegetables, whole grains, nuts, seeds, and beans. This information is not intended to replace advice given to you by your health care provider. Make sure you discuss any questions you have with your health care provider. Document Revised: 06/23/2019 Document Reviewed: 06/23/2019 Elsevier Patient Education  2024 [employer].

## 2022-09-18 ENCOUNTER — Ambulatory Visit (HOSPITAL_COMMUNITY)
Admission: RE | Admit: 2022-09-18 | Discharge: 2022-09-18 | Disposition: A | Discharge status: Home / Self Care | Payer: 59 | Source: Ambulatory Visit | Attending: Vascular Surgery | Admitting: Vascular Surgery

## 2022-09-18 ENCOUNTER — Encounter: Payer: Self-pay | Admitting: Vascular Surgery

## 2022-09-18 ENCOUNTER — Ambulatory Visit (INDEPENDENT_AMBULATORY_CARE_PROVIDER_SITE_OTHER)
Admission: RE | Admit: 2022-09-18 | Discharge: 2022-09-18 | Disposition: A | Discharge status: Home / Self Care | Payer: 59 | Source: Ambulatory Visit | Attending: Vascular Surgery | Admitting: Vascular Surgery

## 2022-09-18 ENCOUNTER — Ambulatory Visit (INDEPENDENT_AMBULATORY_CARE_PROVIDER_SITE_OTHER): Payer: 59 | Admitting: Vascular Surgery

## 2022-09-18 VITALS — BP 155/88 | HR 57 | Temp 97.3°F | Resp 18 | Ht 65.0 in | Wt 196.7 lb

## 2022-09-18 DIAGNOSIS — I7409 Other arterial embolism and thrombosis of abdominal aorta: Secondary | ICD-10-CM | POA: Diagnosis not present

## 2022-09-18 DIAGNOSIS — Z48812 Encounter for surgical aftercare following surgery on the circulatory system: Secondary | ICD-10-CM

## 2022-09-18 DIAGNOSIS — I739 Peripheral vascular disease, unspecified: Secondary | ICD-10-CM

## 2022-09-18 LAB — VAS US ABI WITH/WO TBI
Left ABI: 0.95
Right ABI: 0.95

## 2022-09-18 NOTE — Progress Notes (Signed)
REASON FOR VISIT:   Follow-up of peripheral arterial disease  MEDICAL ISSUES:   S/P LEFT AXILLOBIFEMORAL BYPASS: This patient underwent a left axillobifemoral bypass graft on 02/13/2022.  Follow-up duplex scan today shows that her graft is widely patent.  She has biphasic Doppler signals in both feet with ABIs of 95%.  I have encouraged her to stay as active as possible.  She is not a smoker.  She is on aspirin and is on a statin.  I have ordered a follow-up graft duplex and ABIs in 6 months.  I have explained that I will be retiring so she will be seen on the PA schedule at that time.  She knows to call sooner if she has problems.  HPI:   Brandy Stevenson is a pleasant 67 y.o. female who I have been following with aortoiliac occlusive disease.  She had progressed to the point of rest pain and ultimately elected to undergo revascularization.  Her aorta was heavily calcified all the way up to the renals.  She also had significant COPD.  Therefore I recommended extra-anatomic bypass.  She underwent a left axillobifemoral bypass graft on 02/13/2022.  She states that her symptoms improved significantly and she is now able to walk with her friends without claudication.  She denies any rest pain.  She quit smoking in 2018.  She is on aspirin and is on a statin.  Past Medical History:  Diagnosis Date   Arthritis    back problems to be evaluated by neurology next month   Cancer [facility]) 2009   L, Breast, radiation and surgery.   COPD (chronic obstructive pulmonary disease) (HCC)    Depression    GERD (gastroesophageal reflux disease)    Hyperlipemia    Hypertension    Leriche syndrome (HCC)    Infrarenal aortic occlusion extending to bilateral internal iliac arteries.  External iliacs fill via collaterals from lumbar arteries to hypogastric/internal iliac arteries -minimal disease on the right side and no disease on the left side leg arteries distally.   Peripheral vascular disease (HCC)  2013   Pre-diabetes     Family History  Problem Relation Age of Onset   Hyperlipidemia Mother    Hypertension Mother    Heart disease Mother        Atrial Fib.   Hyperlipidemia Sister    Hypertension Sister    Heart disease Sister        Before age 91,  CHF   Heart attack Brother    Hyperlipidemia Brother    Hypertension Brother    Heart disease Brother    Hypertension Daughter     SOCIAL HISTORY: Social History   Tobacco Use   Smoking status: Former    Current packs/day: 0.00    Average packs/day: 0.3 packs/day for 40.0 years (10.0 ttl pk-yrs)    Types: Cigarettes    Start date: 02/08/1977    Quit date: 02/08/2017    Years since quitting: 5.6    Passive exposure: Never   Smokeless tobacco: Never  Substance Use Topics   Alcohol use: No    Allergies  Allergen Reactions   Vicodin [Hydrocodone-Acetaminophen] Nausea And Vomiting and Other (See Comments)    Sweating   Bactrim [Sulfamethoxazole-Trimethoprim] Nausea And Vomiting    HA    Incruse Ellipta [Umeclidinium Bromide] Other (See Comments)    Dry mouth   Tramadol Nausea And Vomiting   Wellbutrin [Bupropion] Other (See Comments)    Made me feel bad and sleepy  Current Outpatient Medications  Medication Sig Dispense Refill   acetaminophen (TYLENOL) 500 MG tablet Take 1,000 mg by mouth every 8 (eight) hours.     albuterol (PROAIR HFA) 108 (90 Base) MCG/ACT inhaler Inhale 2 puffs into the lungs every 6 (six) hours as needed for wheezing or shortness of breath. 6.7 g 3   amLODipine (NORVASC) 5 MG tablet Take 1 tablet (5 mg total) by mouth daily. 90 tablet 1   aspirin EC 81 MG tablet Take 1 tablet (81 mg total) by mouth daily. 90 tablet 3   atorvastatin (LIPITOR) 80 MG tablet TAKE 1 TABLET BY MOUTH AT  BEDTIME 100 tablet 2   carvedilol (COREG) 6.25 MG tablet TAKE 1 TABLET (6.25 MG TOTAL) BY MOUTH 2 (TWO) TIMES DAILY. 180 tablet 1   diclofenac Sodium (VOLTAREN) 1 % GEL Apply 4 g topically 4 (four) times daily. 100  g 1   fluticasone-salmeterol (ADVAIR) 250-50 MCG/ACT AEPB Inhale 1 puff into the lungs in the morning and at bedtime. 60 each 2   gabapentin (NEURONTIN) 300 MG capsule TAKE 1 CAPSULE BY MOUTH AT  BEDTIME 100 capsule 2   hydrALAZINE (APRESOLINE) 25 MG tablet Take 1 tablet (25 mg total) by mouth 3 (three) times daily. 270 tablet 0   meloxicam (MOBIC) 15 MG tablet TAKE 1 TABLET BY MOUTH DAILY 100 tablet 2   Menthol, Topical Analgesic, (BIOFREEZE) 5 % PTCH Apply 0.5 patches topically daily as needed (pain).     nystatin (MYCOSTATIN) 100000 UNIT/ML suspension SWISH AND SWALLOW 5 MLS (500,000 UNITS TOTAL) BY MOUTH 4 (FOUR) TIMES DAILY. 100 mL 0   omeprazole (PRILOSEC) 40 MG capsule TAKE 1 CAPSULE BY MOUTH DAILY 100 capsule 0   potassium chloride (KLOR-CON) 10 MEQ tablet Take 2 tablets (20 mEq total) by mouth daily. 180 tablet 1   spironolactone (ALDACTONE) 25 MG tablet Take 1 tablet (25 mg total) by mouth daily. 90 tablet 3   tiotropium (SPIRIVA HANDIHALER) 18 MCG inhalation capsule Place 1 capsule (18 mcg total) into inhaler and inhale daily. 30 capsule 12   No current facility-administered medications for this visit.    REVIEW OF SYSTEMS:  [X]  denotes positive finding, [ ]  denotes negative finding Cardiac  Comments:  Chest pain or chest pressure:    Shortness of breath upon exertion:    Short of breath when lying flat:    Irregular heart rhythm:        Vascular    Pain in calf, thigh, or hip brought on by ambulation:    Pain in feet at night that wakes you up from your sleep:     Blood clot in your veins:    Leg swelling:         Pulmonary    Oxygen at home:    Productive cough:     Wheezing:         Neurologic    Sudden weakness in arms or legs:     Sudden numbness in arms or legs:     Sudden onset of difficulty speaking or slurred speech:    Temporary loss of vision in one eye:     Problems with dizziness:         Gastrointestinal    Blood in stool:     Vomited blood:          Genitourinary    Burning when urinating:     Blood in urine:        Psychiatric    Major depression:  Hematologic    Bleeding problems:    Problems with blood clotting too easily:        Skin    Rashes or ulcers:        Constitutional    Fever or chills:     PHYSICAL EXAM:   Vitals:   09/18/22 0840  BP: (!) 155/88  Pulse: (!) 57  Resp: 18  Temp: (!) 97.3 F (36.3 C)  TempSrc: Temporal  SpO2: 94%  Weight: 196 lb 11.2 oz (89.2 kg)  Height: 5\' 5"  (1.651 m)    GENERAL: The patient is a well-nourished female, in no acute distress. The vital signs are documented above. CARDIAC: There is a regular rate and rhythm.  VASCULAR: I do not detect carotid bruits. All of her her incisions are healing nicely. She has palpable dorsalis pedis pulses bilaterally. PULMONARY: There is good air exchange bilaterally without wheezing or rales. ABDOMEN: Soft and non-tender with normal pitched bowel sounds.  MUSCULOSKELETAL: There are no major deformities or cyanosis. NEUROLOGIC: No focal weakness or paresthesias are detected. SKIN: There are no ulcers or rashes noted. PSYCHIATRIC: The patient has a normal affect.  DATA:    GRAFT DUPLEX: I have independently interpreted the patient's graft duplex of her left axillobifemoral bypass graft.  The left axillofemoral graft is widely patent with some mild stenosis at the distal anastomosis.  Peak systolic velocity is 222 cm/s.  The left or right femoral-femoral graft is widely patent with some mild stenosis at the proximal anastomosis.  Peak systolic velocity is 220 cm/s.  ARTERIAL DOPPLER STUDY: I have independently interpreted her arterial Doppler study today.  On the right side there is a biphasic posterior tibial and dorsalis pedis signal.  ABIs 95%.  Toe pressures 95 mmHg.  On the left side there is a biphasic posterior tibial and dorsalis pedis signal.  ABIs 95%.  Toe pressures 117 mmHg.  [facility] [phone]

## 2022-09-24 ENCOUNTER — Other Ambulatory Visit: Payer: Self-pay

## 2022-09-24 DIAGNOSIS — I7409 Other arterial embolism and thrombosis of abdominal aorta: Secondary | ICD-10-CM

## 2022-09-24 DIAGNOSIS — I739 Peripheral vascular disease, unspecified: Secondary | ICD-10-CM

## 2022-09-25 ENCOUNTER — Other Ambulatory Visit: Payer: Self-pay

## 2022-09-26 ENCOUNTER — Other Ambulatory Visit: Payer: Self-pay

## 2022-10-01 ENCOUNTER — Other Ambulatory Visit: Payer: Self-pay | Admitting: Internal Medicine

## 2022-10-01 DIAGNOSIS — Z760 Encounter for issue of repeat prescription: Secondary | ICD-10-CM

## 2022-10-07 ENCOUNTER — Other Ambulatory Visit: Payer: Self-pay | Admitting: Internal Medicine

## 2022-10-07 ENCOUNTER — Other Ambulatory Visit: Payer: Self-pay

## 2022-10-07 DIAGNOSIS — I10 Essential (primary) hypertension: Secondary | ICD-10-CM

## 2022-10-07 MED ORDER — HYDRALAZINE HCL 25 MG PO TABS
25.0000 mg | ORAL_TABLET | Freq: Three times a day (TID) | ORAL | 0 refills | Status: DC
Start: 2022-10-07 — End: 2022-12-27
  Filled 2022-10-07: qty 270, 90d supply, fill #0

## 2022-10-08 ENCOUNTER — Other Ambulatory Visit: Payer: Self-pay

## 2022-10-10 ENCOUNTER — Other Ambulatory Visit: Payer: Self-pay

## 2022-10-21 ENCOUNTER — Ambulatory Visit: Payer: 59 | Attending: Internal Medicine

## 2022-10-21 VITALS — Ht 65.0 in | Wt 196.0 lb

## 2022-10-21 DIAGNOSIS — Z0000 Encounter for general adult medical examination without abnormal findings: Secondary | ICD-10-CM

## 2022-10-21 NOTE — Progress Notes (Signed)
Subjective:   Brandy Stevenson is a 67 y.o. female who presents for Medicare Annual (Subsequent) preventive examination.  Visit Complete: Virtual  I connected with  Brandy Stevenson on 10/21/22 by a audio enabled telemedicine application and verified that I am speaking with the correct person using two identifiers.  Patient Location: Home  Provider Location: Home Office  I discussed the limitations of evaluation and management by telemedicine. The patient expressed understanding and agreed to proceed.  Vital Signs: Because this visit was a virtual/telehealth visit, some criteria may be missing or patient reported. Any vitals not documented were not able to be obtained and vitals that have been documented are patient reported.   Review of Systems     Cardiac Risk Factors include: advanced age (>77men, >80 women);smoking/ tobacco exposure;hypertension;dyslipidemia     Objective:    Today's Vitals   10/21/22 1131  Weight: 196 lb (88.9 kg)  Height: 5\' 5"  (1.651 m)   Body mass index is 32.62 kg/m.     10/21/2022   12:20 PM 01/10/2022    9:47 AM 10/29/2021    5:59 AM 10/22/2021    9:20 AM 08/07/2021    8:39 AM 10/23/2020   10:11 AM 10/05/2019    8:46 AM  Advanced Directives  Does Patient Have a Medical Advance Directive? No No No No No No No  Would patient like information on creating a medical advance directive? Yes (MAU/Ambulatory/Procedural Areas - Information given) Yes (ED - Information included in AVS) No - Patient declined No - Patient declined  No - Patient declined No - Patient declined    Current Medications (verified) Outpatient Encounter Medications as of 10/21/2022  Medication Sig   acetaminophen (TYLENOL) 500 MG tablet Take 1,000 mg by mouth every 8 (eight) hours.   albuterol (PROAIR HFA) 108 (90 Base) MCG/ACT inhaler Inhale 2 puffs into the lungs every 6 (six) hours as needed for wheezing or shortness of breath.   amLODipine (NORVASC) 5 MG tablet Take  1 tablet (5 mg total) by mouth daily.   aspirin EC 81 MG tablet Take 1 tablet (81 mg total) by mouth daily.   atorvastatin (LIPITOR) 80 MG tablet TAKE 1 TABLET BY MOUTH AT  BEDTIME   carvedilol (COREG) 6.25 MG tablet TAKE 1 TABLET (6.25 MG TOTAL) BY MOUTH 2 (TWO) TIMES DAILY.   diclofenac Sodium (VOLTAREN) 1 % GEL Apply 4 g topically 4 (four) times daily.   fluticasone-salmeterol (ADVAIR) 250-50 MCG/ACT AEPB Inhale 1 puff into the lungs in the morning and at bedtime.   gabapentin (NEURONTIN) 300 MG capsule TAKE 1 CAPSULE BY MOUTH AT  BEDTIME   hydrALAZINE (APRESOLINE) 25 MG tablet Take 1 tablet (25 mg total) by mouth 3 (three) times daily.   meloxicam (MOBIC) 15 MG tablet TAKE 1 TABLET BY MOUTH DAILY   Menthol, Topical Analgesic, (BIOFREEZE) 5 % PTCH Apply 0.5 patches topically daily as needed (pain).   nystatin (MYCOSTATIN) 100000 UNIT/ML suspension SWISH AND SWALLOW 5 MLS (500,000 UNITS TOTAL) BY MOUTH 4 (FOUR) TIMES DAILY.   omeprazole (PRILOSEC) 40 MG capsule TAKE 1 CAPSULE BY MOUTH DAILY   potassium chloride (KLOR-CON) 10 MEQ tablet Take 2 tablets (20 mEq total) by mouth daily.   spironolactone (ALDACTONE) 25 MG tablet Take 1 tablet (25 mg total) by mouth daily.   tiotropium (SPIRIVA HANDIHALER) 18 MCG inhalation capsule Place 1 capsule (18 mcg total) into inhaler and inhale daily.   No facility-administered encounter medications on file as of 10/21/2022.  Allergies (verified) Vicodin [hydrocodone-acetaminophen], Bactrim [sulfamethoxazole-trimethoprim], Incruse ellipta [umeclidinium bromide], Tramadol, and Wellbutrin [bupropion]   History: Past Medical History:  Diagnosis Date   Arthritis    back problems to be evaluated by neurology next month   Cancer (HCC) 2009   L, Breast, radiation and surgery.   COPD (chronic obstructive pulmonary disease) (HCC)    Depression    GERD (gastroesophageal reflux disease)    Hyperlipemia    Hypertension    Leriche syndrome (HCC)     Infrarenal aortic occlusion extending to bilateral internal iliac arteries.  External iliacs fill via collaterals from lumbar arteries to hypogastric/internal iliac arteries -minimal disease on the right side and no disease on the left side leg arteries distally.   Peripheral vascular disease (HCC) 2013   Pre-diabetes    Past Surgical History:  Procedure Laterality Date   APPENDECTOMY  1980   AXILLARY-FEMORAL BYPASS GRAFT Bilateral 10/29/2021   Procedure: LEFT AXILLOBIFEMORAL BYPASS GRAFT;  Surgeon: Chuck Hint, MD;  Location: [facility] OR;  Service: Vascular;  Laterality: Bilateral;   BREAST LUMPECTOMY Left    s/p radiation therapy    BREAST SURGERY     COLONOSCOPY  07/2015   COLONOSCOPY WITH PROPOFOL N/A 06/26/2015   Procedure: COLONOSCOPY WITH PROPOFOL;  Surgeon: Charolett Bumpers, MD;  Location: [facility] ENDOSCOPY;  Service: Endoscopy;  Laterality: N/A;   CT CTA CORONARY W/CA SCORE W/CM &/OR WO/CM  12/10/2017   Coronary calcium score is 0.  No evidence of CAD.   PERIPHERAL VASCULAR CATHETERIZATION N/A 10/22/2015   Procedure: Abdominal Aortogram w/Lower Extremity;  Surgeon: Chuck Hint, MD;  Location: [facility] INVASIVE CV LAB: Aortic arch widely patent as far as innominate artery, right subclavian artery, right vertebral artery and right, common carotid.  Bilateral single renal arteries - Nonocclusive disease. Infrarenal aorta 100% - bilateral common Iliac 100% --> B Ex Iliacs fill via Int Iliac. Leg OK   TRANSTHORACIC ECHOCARDIOGRAM  08/2017   EF 60-65%. No RWMA.  Normal DF.  Normal valves.    TUBAL LIGATION  1991   Family History  Problem Relation Age of Onset   Hyperlipidemia Mother    Hypertension Mother    Heart disease Mother        Atrial Fib.   Hyperlipidemia Sister    Hypertension Sister    Heart disease Sister        Before age 61,  CHF   Heart attack Brother    Hyperlipidemia Brother    Hypertension Brother    Heart disease Brother    Hypertension Daughter    Social  History   Socioeconomic History   Marital status: Married    Spouse name: Not on file   Number of children: Not on file   Years of education: Not on file   Highest education level: Not on file  Occupational History   Not on file  Tobacco Use   Smoking status: Former    Current packs/day: 0.00    Average packs/day: 0.3 packs/day for 40.0 years (10.0 ttl pk-yrs)    Types: Cigarettes    Start date: 02/08/1977    Quit date: 02/08/2017    Years since quitting: 5.7    Passive exposure: Never   Smokeless tobacco: Never  Vaping Use   Vaping status: Never Used  Substance and Sexual Activity   Alcohol use: No   Drug use: No   Sexual activity: Yes  Other Topics Concern   Not on file  Social History Narrative  Not on file   Social Determinants of Health   Financial Resource Strain: Low Risk  (10/21/2022)   Overall Financial Resource Strain (CARDIA)    Difficulty of Paying Living Expenses: Not hard at all  Food Insecurity: No Food Insecurity (10/21/2022)   Hunger Vital Sign    Worried About Running Out of Food in the Last Year: Never true    Ran Out of Food in the Last Year: Never true  Transportation Needs: No Transportation Needs (10/21/2022)   PRAPARE - Administrator, Civil Service (Medical): No    Lack of Transportation (Non-Medical): No  Physical Activity: Insufficiently Active (10/21/2022)   Exercise Vital Sign    Days of Exercise per Week: 3 days    Minutes of Exercise per Session: 30 min  Stress: No Stress Concern Present (10/21/2022)   [employer] of Occupational Health - Occupational Stress Questionnaire    Feeling of Stress : Not at all  Social Connections: Socially Integrated (10/21/2022)   Social Connection and Isolation Panel [NHANES]    Frequency of Communication with Friends and Family: More than three times a week    Frequency of Social Gatherings with Friends and Family: Three times a week    Attends Religious Services: More than 4 times per year     Active Member of Clubs or Organizations: Yes    Attends Banker Meetings: 1 to 4 times per year    Marital Status: Married    Tobacco Counseling Counseling given: Not Answered   Clinical Intake:  Pre-visit preparation completed: Yes  Pain : No/denies pain  Diabetes: No  How often do you need to have someone help you when you read instructions, pamphlets, or other written materials from your doctor or pharmacy?: 1 - Never  Interpreter Needed?: No  Information entered by :: Kandis Fantasia LPN   Activities of Daily Living    10/21/2022   12:19 PM 01/10/2022    9:52 AM  In your present state of health, do you have any difficulty performing the following activities:  Hearing? 0 0  Vision? 0 0  Difficulty concentrating or making decisions? 0 0  Walking or climbing stairs? 0 1  Dressing or bathing? 0 0  Doing errands, shopping? 0 0  Preparing Food and eating ? N N  Using the Toilet? N N  In the past six months, have you accidently leaked urine? N N  Do you have problems with loss of bowel control? N N  Managing your Medications? N N  Managing your Finances? N N  Housekeeping or managing your Housekeeping? N N    Patient Care Team: Marcine Matar, MD as PCP - General (Internal Medicine) Marykay Lex, MD as PCP - Cardiology (Cardiology)  Indicate any recent Medical Services you may have received from other than [facility] providers in the past year (date may be approximate).     Assessment:   This is a routine wellness examination for [place].  Hearing/Vision screen Hearing Screening - Comments:: Denies hearing difficulties   Vision Screening - Comments:: .css  Dietary issues and exercise activities discussed:     Goals Addressed             This Visit's Progress    Remain active and independent        Depression Screen    10/21/2022   12:13 PM 08/25/2022   10:58 AM 04/17/2022    1:58 PM 01/10/2022    9:47 AM 06/14/2021  2:15 PM  02/12/2021    9:15 AM 10/23/2020   10:10 AM  PHQ 2/9 Scores  PHQ - 2 Score 1 1 1  0 1 1 2   PHQ- 9 Score 4 4 4   5 7     Fall Risk    10/21/2022   12:19 PM 08/25/2022   10:52 AM 04/01/2022    9:55 AM 01/10/2022    9:47 AM 12/12/2021    1:52 PM  Fall Risk   Falls in the past year? 0 0 0 0 0  Number falls in past yr: 0 0 0 0 0  Injury with Fall? 0 0 0 0 0  Risk for fall due to : No Fall Risks No Fall Risks  No Fall Risks No Fall Risks  Follow up Falls prevention discussed;Education provided;Falls evaluation completed    Falls evaluation completed    MEDICARE RISK AT HOME: Medicare Risk at Home Any stairs in or around the home?: No If so, are there any without handrails?: No Home free of loose throw rugs in walkways, pet beds, electrical cords, etc?: Yes Adequate lighting in your home to reduce risk of falls?: Yes Life alert?: No Use of a cane, walker or w/c?: No Grab bars in the bathroom?: Yes Shower chair or bench in shower?: No Elevated toilet seat or a handicapped toilet?: No  TIMED UP AND GO:  Was the test performed?  No    Cognitive Function:    01/10/2022    9:48 AM 10/23/2020   10:09 AM  MMSE - Mini Mental State Exam  Orientation to time 5 5  Orientation to Place 5 5  Registration 3 3  Attention/ Calculation 5 5  Recall 3 3  Language- name 2 objects 2 2  Language- repeat 1 1  Language- follow 3 step command 3 3  Language- read & follow direction 1 1  Write a sentence 1 1  Copy design 1 0  Total score 30 29        10/21/2022   12:20 PM 01/10/2022    9:48 AM 08/30/2019   10:43 AM  6CIT Screen  What Year? 0 points 0 points 0 points  What month? 0 points 0 points 0 points  What time? 0 points 0 points 0 points  Count back from 20 0 points 0 points 0 points  Months in reverse 0 points 0 points 0 points  Repeat phrase 0 points 0 points 0 points  Total Score 0 points 0 points 0 points    Immunizations  There is no immunization history on file for this  patient.  TDAP status: Due, Education has been provided regarding the importance of this vaccine. Advised may receive this vaccine at local pharmacy or Health Dept. Aware to provide a copy of the vaccination record if obtained from local pharmacy or Health Dept. Verbalized acceptance and understanding.  Flu Vaccine status: Declined, Education has been provided regarding the importance of this vaccine but patient still declined. Advised may receive this vaccine at local pharmacy or Health Dept. Aware to provide a copy of the vaccination record if obtained from local pharmacy or Health Dept. Verbalized acceptance and understanding.  Pneumococcal vaccine status: Declined,  Education has been provided regarding the importance of this vaccine but patient still declined. Advised may receive this vaccine at local pharmacy or Health Dept. Aware to provide a copy of the vaccination record if obtained from local pharmacy or Health Dept. Verbalized acceptance and understanding.   Covid-19 vaccine status:  Declined, Education has been provided regarding the importance of this vaccine but patient still declined. Advised may receive this vaccine at local pharmacy or Health Dept.or vaccine clinic. Aware to provide a copy of the vaccination record if obtained from local pharmacy or Health Dept. Verbalized acceptance and understanding.  Qualifies for Shingles Vaccine? Yes   Zostavax completed No   Shingrix Completed?: No.    Education has been provided regarding the importance of this vaccine. Patient has been advised to call insurance company to determine out of pocket expense if they have not yet received this vaccine. Advised may also receive vaccine at local pharmacy or Health Dept. Verbalized acceptance and understanding.  Screening Tests Health Maintenance  Topic Date Due   DTaP/Tdap/Td (1 - Tdap) Never done   DEXA SCAN  10/19/2021   Colonoscopy  04/11/2022   Zoster Vaccines- Shingrix (1 of 2) 12/12/2022  (Originally 10/20/2006)   Pneumonia Vaccine 21+ Years old (1 of 2 - PCV) 12/13/2022 (Originally 10/20/1961)   COVID-19 Vaccine (1 - 2023-24 season) 12/29/2022 (Originally 11/01/2021)   INFLUENZA VACCINE  06/01/2023 (Originally 10/02/2022)   MAMMOGRAM  02/21/2023   Medicare Annual Wellness (AWV)  10/21/2023   Hepatitis C Screening  Completed   HPV VACCINES  Aged Out    Health Maintenance  Health Maintenance Due  Topic Date Due   DTaP/Tdap/Td (1 - Tdap) Never done   DEXA SCAN  10/19/2021   Colonoscopy  04/11/2022    Colorectal cancer screening: Referral to GI placed  . Pt aware the office will call re: appt.  Mammogram status: Completed 02/20/22. Repeat every year  Dexa status:  Scheduled for 10/22/22   Lung Cancer Screening: (Low Dose CT Chest recommended if Age 58-80 years, 20 pack-year currently smoking OR have quit w/in 15years.) does not qualify.   Lung Cancer Screening Referral: n/a  Additional Screening:  Hepatitis C Screening: does qualify; Completed 04/26/21  Vision Screening: Recommended annual ophthalmology exams for early detection of glaucoma and other disorders of the eye. Is the patient up to date with their annual eye exam?  Yes  Who is the provider or what is the name of the office in which the patient attends annual eye exams? [facility] If pt is not established with a provider, would they like to be referred to a provider to establish care? No .   Dental Screening: Recommended annual dental exams for proper oral hygiene  Community Resource Referral / Chronic Care Management: CRR required this visit?  No   CCM required this visit?  No     Plan:     I have personally reviewed and noted the following in the patient's chart:   Medical and social history Use of alcohol, tobacco or illicit drugs  Current medications and supplements including opioid prescriptions. Patient is not currently taking opioid prescriptions. Functional ability and  status Nutritional status Physical activity Advanced directives List of other physicians Hospitalizations, surgeries, and ER visits in previous 12 months Vitals Screenings to include cognitive, depression, and falls Referrals and appointments  In addition, I have reviewed and discussed with patient certain preventive protocols, quality metrics, and best practice recommendations. A written personalized care plan for preventive services as well as general preventive health recommendations were provided to patient.     Kandis Fantasia [place], [place]   1/66/0630   After Visit Summary: (MyChart) Due to this being a telephonic visit, the after visit summary with patients personalized plan was offered to patient via MyChart  Nurse Notes: No concerns at this time

## 2022-10-21 NOTE — Patient Instructions (Addendum)
Brandy Stevenson , Thank you for taking time to come for your Medicare Wellness Visit. I appreciate your ongoing commitment to your health goals. Please review the following plan we discussed and let me know if I can assist you in the future.   Referrals/Orders/Follow-Ups/Clinician Recommendations: Aim for 30 minutes of exercise or brisk walking, 6-8 glasses of water, and 5 servings of fruits and vegetables each day.  This is a list of the screening recommended for you and due dates:  Health Maintenance  Topic Date Due   DTaP/Tdap/Td vaccine (1 - Tdap) Never done   DEXA scan (bone density measurement)  10/19/2021   Colon Cancer Screening  04/11/2022   Zoster (Shingles) Vaccine (1 of 2) 12/12/2022*   Pneumonia Vaccine (1 of 2 - PCV) 12/13/2022*   COVID-19 Vaccine (1 - 2023-24 season) 12/29/2022*   Flu Shot  06/01/2023*   Mammogram  02/21/2023   Medicare Annual Wellness Visit  10/21/2023   Hepatitis C Screening  Completed   HPV Vaccine  Aged Out  *Topic was postponed. The date shown is not the original due date.    Advanced directives: (ACP Link)Information on Advanced Care Planning can be found at [facility] Advance Health Care Directives Advance Health Care Directives ([email or web address]   Next Medicare Annual Wellness Visit scheduled for next year: Yes  Preventive Care 65 Years and Older, Female Preventive care refers to lifestyle choices and visits with your health care provider that can promote health and wellness. What does preventive care include? A yearly physical exam. This is also called an annual well check. Dental exams once or twice a year. Routine eye exams. Ask your health care provider how often you should have your eyes checked. Personal lifestyle choices, including: Daily care of your teeth and gums. Regular physical activity. Eating a healthy diet. Avoiding tobacco and drug use. Limiting alcohol use. Practicing safe sex. Taking low-dose aspirin every  day. Taking vitamin and mineral supplements as recommended by your health care provider. What happens during an annual well check? The services and screenings done by your health care provider during your annual well check will depend on your age, overall health, lifestyle risk factors, and family history of disease. Counseling  Your health care provider may ask you questions about your: Alcohol use. Tobacco use. Drug use. Emotional well-being. Home and relationship well-being. Sexual activity. Eating habits. History of falls. Memory and ability to understand (cognition). Work and work Astronomer. Reproductive health. Screening  You may have the following tests or measurements: Height, weight, and BMI. Blood pressure. Lipid and cholesterol levels. These may be checked every 5 years, or more frequently if you are over 59 years old. Skin check. Lung cancer screening. You may have this screening every year starting at age 54 if you have a 30-pack-year history of smoking and currently smoke or have quit within the past 15 years. Fecal occult blood test (FOBT) of the stool. You may have this test every year starting at age 61. Flexible sigmoidoscopy or colonoscopy. You may have a sigmoidoscopy every 5 years or a colonoscopy every 10 years starting at age 50. Hepatitis C blood test. Hepatitis B blood test. Sexually transmitted disease (STD) testing. Diabetes screening. This is done by checking your blood sugar (glucose) after you have not eaten for a while (fasting). You may have this done every 1-3 years. Bone density scan. This is done to screen for osteoporosis. You may have this done starting at age 33. Mammogram. This may  be done every 1-2 years. Talk to your health care provider about how often you should have regular mammograms. Talk with your health care provider about your test results, treatment options, and if necessary, the need for more tests. Vaccines  Your health care  provider may recommend certain vaccines, such as: Influenza vaccine. This is recommended every year. Tetanus, diphtheria, and acellular pertussis (Tdap, Td) vaccine. You may need a Td booster every 10 years. Zoster vaccine. You may need this after age 74. Pneumococcal 13-valent conjugate (PCV13) vaccine. One dose is recommended after age 74. Pneumococcal polysaccharide (PPSV23) vaccine. One dose is recommended after age 25. Talk to your health care provider about which screenings and vaccines you need and how often you need them. This information is not intended to replace advice given to you by your health care provider. Make sure you discuss any questions you have with your health care provider. Document Released: 03/16/2015 Document Revised: 11/07/2015 Document Reviewed: 12/19/2014 Elsevier Interactive Patient Education  2017 [employer].  Fall Prevention in the Home Falls can cause injuries. They can happen to people of all ages. There are many things you can do to make your home safe and to help prevent falls. What can I do on the outside of my home? Regularly fix the edges of walkways and driveways and fix any cracks. Remove anything that might make you trip as you walk through a door, such as a raised step or threshold. Trim any bushes or trees on the path to your home. Use bright outdoor lighting. Clear any walking paths of anything that might make someone trip, such as rocks or tools. Regularly check to see if handrails are loose or broken. Make sure that both sides of any steps have handrails. Any raised decks and porches should have guardrails on the edges. Have any leaves, snow, or ice cleared regularly. Use sand or salt on walking paths during winter. Clean up any spills in your garage right away. This includes oil or grease spills. What can I do in the bathroom? Use night lights. Install grab bars by the toilet and in the tub and shower. Do not use towel bars as grab  bars. Use non-skid mats or decals in the tub or shower. If you need to sit down in the shower, use a plastic, non-slip stool. Keep the floor dry. Clean up any water that spills on the floor as soon as it happens. Remove soap buildup in the tub or shower regularly. Attach bath mats securely with double-sided non-slip rug tape. Do not have throw rugs and other things on the floor that can make you trip. What can I do in the bedroom? Use night lights. Make sure that you have a light by your bed that is easy to reach. Do not use any sheets or blankets that are too big for your bed. They should not hang down onto the floor. Have a firm chair that has side arms. You can use this for support while you get dressed. Do not have throw rugs and other things on the floor that can make you trip. What can I do in the kitchen? Clean up any spills right away. Avoid walking on wet floors. Keep items that you use a lot in easy-to-reach places. If you need to reach something above you, use a strong step stool that has a grab bar. Keep electrical cords out of the way. Do not use floor polish or wax that makes floors slippery. If you must use  wax, use non-skid floor wax. Do not have throw rugs and other things on the floor that can make you trip. What can I do with my stairs? Do not leave any items on the stairs. Make sure that there are handrails on both sides of the stairs and use them. Fix handrails that are broken or loose. Make sure that handrails are as long as the stairways. Check any carpeting to make sure that it is firmly attached to the stairs. Fix any carpet that is loose or worn. Avoid having throw rugs at the top or bottom of the stairs. If you do have throw rugs, attach them to the floor with carpet tape. Make sure that you have a light switch at the top of the stairs and the bottom of the stairs. If you do not have them, ask someone to add them for you. What else can I do to help prevent  falls? Wear shoes that: Do not have high heels. Have rubber bottoms. Are comfortable and fit you well. Are closed at the toe. Do not wear sandals. If you use a stepladder: Make sure that it is fully opened. Do not climb a closed stepladder. Make sure that both sides of the stepladder are locked into place. Ask someone to hold it for you, if possible. Clearly mark and make sure that you can see: Any grab bars or handrails. First and last steps. Where the edge of each step is. Use tools that help you move around (mobility aids) if they are needed. These include: Canes. Walkers. Scooters. Crutches. Turn on the lights when you go into a dark area. Replace any light bulbs as soon as they burn out. Set up your furniture so you have a clear path. Avoid moving your furniture around. If any of your floors are uneven, fix them. If there are any pets around you, be aware of where they are. Review your medicines with your doctor. Some medicines can make you feel dizzy. This can increase your chance of falling. Ask your doctor what other things that you can do to help prevent falls. This information is not intended to replace advice given to you by your health care provider. Make sure you discuss any questions you have with your health care provider. Document Released: 12/14/2008 Document Revised: 07/26/2015 Document Reviewed: 03/24/2014 Elsevier Interactive Patient Education  2017 [employer].

## 2022-10-22 ENCOUNTER — Ambulatory Visit
Admission: RE | Admit: 2022-10-22 | Discharge: 2022-10-22 | Disposition: A | Discharge status: Home / Self Care | Payer: 59 | Source: Ambulatory Visit | Attending: Internal Medicine | Admitting: Internal Medicine

## 2022-10-22 ENCOUNTER — Telehealth: Payer: Self-pay | Admitting: *Deleted

## 2022-10-22 ENCOUNTER — Telehealth: Payer: Self-pay | Admitting: Internal Medicine

## 2022-10-22 DIAGNOSIS — M8588 Other specified disorders of bone density and structure, other site: Secondary | ICD-10-CM | POA: Diagnosis not present

## 2022-10-22 DIAGNOSIS — E349 Endocrine disorder, unspecified: Secondary | ICD-10-CM | POA: Diagnosis not present

## 2022-10-22 DIAGNOSIS — J449 Chronic obstructive pulmonary disease, unspecified: Secondary | ICD-10-CM | POA: Diagnosis not present

## 2022-10-22 DIAGNOSIS — M85859 Other specified disorders of bone density and structure, unspecified thigh: Secondary | ICD-10-CM | POA: Insufficient documentation

## 2022-10-22 DIAGNOSIS — Z13820 Encounter for screening for osteoporosis: Secondary | ICD-10-CM

## 2022-10-22 DIAGNOSIS — K219 Gastro-esophageal reflux disease without esophagitis: Secondary | ICD-10-CM | POA: Diagnosis not present

## 2022-10-22 DIAGNOSIS — E559 Vitamin D deficiency, unspecified: Secondary | ICD-10-CM

## 2022-10-22 NOTE — Telephone Encounter (Signed)
Good Morning Verdon Cummins,  We have received a surgical clearance request for Ms. [employer]. They were seen recently in clinic by you on 09/11/2022. Can you please comment on surgical clearance. Please forward you guidance and recommendations to P CV DIV PREOP.  Thank you, Alden Server

## 2022-10-22 NOTE — Telephone Encounter (Signed)
   Patient Name: Lindaann Ying [facility]  DOB: December 28, 1955 MRN: [id number]  Primary Cardiologist: Bryan Lemma, MD  Chart reviewed as part of pre-operative protocol coverage. Given past medical history and time since last visit, based on ACC/AHA guidelines, Alaysiah Minnie is at acceptable risk for the planned procedure without further cardiovascular testing.   Patient can hold ASA 81 mg for 7 days prior to procedure if necessary.  I will route this recommendation to the requesting party via Epic fax function and remove from pre-op pool.  Please call with questions.  Napoleon Form, Leodis Rains, NP 10/22/2022, 2:49 PM

## 2022-10-22 NOTE — Telephone Encounter (Signed)
Phone call placed to patient today to discuss results of bone density study.  Patient informed that the test that shows that she has osteopenia which is thinning of the bones but not to the extent to be called osteoporosis.  Advised that moving forward we need to do things to prevent progression to full osteoporosis.  In looking through her chart, I see that she was diagnosed with vitamin D deficiency several years ago.  She is currently not on any vitamin D supplement.  I request that she comes to the lab sometime this week for [place] to check vitamin D level to better determine whether she needs to be on high-dose vitamin D versus a lower daily dose.  I also advised of the importance of regular weightbearing exercises like walking several days a week.  She expressed understanding.  She will come to the lab tomorrow.

## 2022-10-22 NOTE — Telephone Encounter (Signed)
   Pre-operative Risk Assessment    Patient Name: Cordy Goll [facility]  DOB: Aug 05, 1955 MRN: [id number]    DATE OF LAST VISIT: 09/11/22 Edd Fabian, FNP DATE OF NEXT APPT: NOT SCHEDULED  Request for Surgical Clearance    Procedure:   COLONOSCOPY  Date of Surgery:  Clearance 12/02/22                                 Surgeon:  DR. [name] Surgeon's Group or Practice Name: [facility]  Phone number:  [phone] Fax number:  [phone]   Type of Clearance Requested:   - Medical ; NO MEDICATIONS LISTED TO BE HELD   Type of Anesthesia:   PROPOFOL   Additional requests/questions:    Elpidio Anis   10/22/2022, 11:00 AM

## 2022-10-23 ENCOUNTER — Ambulatory Visit: Payer: 59 | Attending: Internal Medicine

## 2022-10-23 DIAGNOSIS — E559 Vitamin D deficiency, unspecified: Secondary | ICD-10-CM

## 2022-10-24 ENCOUNTER — Other Ambulatory Visit: Payer: Self-pay | Admitting: Internal Medicine

## 2022-10-24 ENCOUNTER — Other Ambulatory Visit: Payer: Self-pay

## 2022-10-24 LAB — VITAMIN D 25 HYDROXY (VIT D DEFICIENCY, FRACTURES): Vit D, 25-Hydroxy: 13 ng/mL — ABNORMAL LOW (ref 30.0–100.0)

## 2022-10-24 MED ORDER — VITAMIN D (ERGOCALCIFEROL) 1.25 MG (50000 UNIT) PO CAPS
50000.0000 [IU] | ORAL_CAPSULE | ORAL | 2 refills | Status: DC
  Filled 2022-10-24 – 2022-11-10 (×2): qty 12, 84d supply, fill #0
  Filled 2023-02-12: qty 12, 84d supply, fill #1
  Filled 2023-05-11 – 2023-06-01 (×2): qty 12, 84d supply, fill #2

## 2022-10-31 ENCOUNTER — Other Ambulatory Visit: Payer: Self-pay

## 2022-11-05 ENCOUNTER — Other Ambulatory Visit: Payer: Self-pay | Admitting: *Deleted

## 2022-11-05 ENCOUNTER — Ambulatory Visit: Payer: Self-pay | Admitting: *Deleted

## 2022-11-05 ENCOUNTER — Other Ambulatory Visit: Payer: Self-pay | Admitting: Internal Medicine

## 2022-11-05 ENCOUNTER — Other Ambulatory Visit: Payer: Self-pay

## 2022-11-05 DIAGNOSIS — J449 Chronic obstructive pulmonary disease, unspecified: Secondary | ICD-10-CM

## 2022-11-05 MED ORDER — ALBUTEROL SULFATE HFA 108 (90 BASE) MCG/ACT IN AERS: 2.0000 | INHALATION_SPRAY | Freq: Four times a day (QID) | RESPIRATORY_TRACT | 3 refills | Status: DC | PRN

## 2022-11-05 NOTE — Telephone Encounter (Signed)
Requested Prescriptions  Pending Prescriptions Disp Refills   albuterol (PROAIR HFA) 108 (90 Base) MCG/ACT inhaler 6.7 g 3    Sig: Inhale 2 puffs into the lungs every 6 (six) hours as needed for wheezing or shortness of breath.     Pulmonology:  Beta Agonists 2 Failed - 11/05/2022  9:01 AM      Failed - Last BP in normal range    BP Readings from Last 1 Encounters:  09/18/22 (!) 155/88         Passed - Last Heart Rate in normal range    Pulse Readings from Last 1 Encounters:  09/18/22 (!) 57         Passed - Valid encounter within last 12 months    Recent Outpatient Visits           2 months ago Essential hypertension   [place] Marcine Matar, MD   6 months ago Essential hypertension   [facility] Marcine Matar, MD   7 months ago Rib pain on left side   [place] [facility] Hoy Register, MD   7 months ago Essential hypertension   [facility] [place], Cornelius Moras, RPH-CPP   10 months ago Essential hypertension   [facility] Marcine Matar, MD       Future Appointments             In 1 month Laural Benes, Binnie Rail, MD [facility]

## 2022-11-05 NOTE — Telephone Encounter (Signed)
Summary: chills + mild cough   Patient called and stated she is visiting her mother and needs a refill of albuterol (PROAIR HFA) 108 (90 Base) MCG/ACT inhaler sent to the pharmacy there. Patient stated she is sick, she stated she is having chills & a mild cough, she stated no fever. Please advise.  [id number] - [place], [place] - [address] AT [facility] OF [address] & [place] [id number].    Patients callback #  [phone]         Chief Complaint: Stuffy Nose Symptoms: Stuffy nose, runny at times. Mild dry cough. Wheezing at times, mild. "Albuterol always helps with this." Frequency: Yesterday Pertinent Negatives: Patient denies SOB, fever Disposition: [] ED /[] Urgent Care (no appt availability in office) / [] Appointment(In office/virtual)/ []  [place] [facility]/ [x] Home Care/ [] Refused Recommended Disposition /[] [place] [facility]/ []  Follow-up with PCP Additional Notes: Home care advise provided, albuterol request sent to [facility] refill pool. Advised UC/ED for worsening symptoms. Also advised home covid test. Pt is out of town.  Reason for Disposition  Cough with cold symptoms (e.g., runny nose, postnasal drip, throat clearing)  Answer Assessment - Initial Assessment Questions 1. ONSET: "When did the cough begin?"      Yesterday 2. SEVERITY: "How bad is the cough today?"      No 3. SPUTUM: "Describe the color of your sputum" (none, dry cough; clear, white, yellow, green)     Dry 4. HEMOPTYSIS: "Are you coughing up any blood?" If so ask: "How much?" (flecks, streaks, tablespoons, etc.)     No 5. DIFFICULTY BREATHING: "Are you having difficulty breathing?" If Yes, ask: "How bad is it?" (e.g., mild, moderate, severe)    - MILD: No SOB at rest, mild SOB with walking, speaks normally in sentences, can lie down, no retractions, pulse < 100.    - MODERATE: SOB at rest, SOB with minimal exertion and prefers to sit, cannot lie down flat, speaks in phrases,  mild retractions, audible wheezing, pulse 100-120.    - SEVERE: Very SOB at rest, speaks in single words, struggling to breathe, sitting hunched forward, retractions, pulse > 120      No unless nose stuffy 6. FEVER: "Do you have a fever?" If Yes, ask: "What is your temperature, how was it measured, and when did it start?"     Chills, no fever 7. CARDIAC HISTORY: "Do you have any history of heart disease?" (e.g., heart attack, congestive heart failure)      *No Answer* 8. LUNG HISTORY: "Do you have any history of lung disease?"  (e.g., pulmonary embolus, asthma, emphysema)     *No Answer* 9. PE RISK FACTORS: "Do you have a history of blood clots?" (or: recent major surgery, recent prolonged travel, bedridden)     *No Answer* 10. OTHER SYMPTOMS: "Do you have any other symptoms?" (e.g., runny nose, wheezing, chest pain)       Runny nose, stuffy  Protocols used: Cough - Acute Non-Productive-A-AH

## 2022-11-10 ENCOUNTER — Other Ambulatory Visit: Payer: Self-pay

## 2022-11-11 ENCOUNTER — Other Ambulatory Visit: Payer: Self-pay

## 2022-11-28 ENCOUNTER — Other Ambulatory Visit: Payer: Self-pay

## 2022-11-28 ENCOUNTER — Other Ambulatory Visit: Payer: Self-pay | Admitting: Internal Medicine

## 2022-11-28 DIAGNOSIS — J449 Chronic obstructive pulmonary disease, unspecified: Secondary | ICD-10-CM

## 2022-11-28 MED ORDER — FLUTICASONE-SALMETEROL 250-50 MCG/ACT IN AEPB
1.0000 | INHALATION_SPRAY | Freq: Two times a day (BID) | RESPIRATORY_TRACT | 2 refills | Status: DC
  Filled 2022-11-28: qty 60, 30d supply, fill #0

## 2022-11-28 MED ORDER — NYSTATIN 100000 UNIT/ML MT SUSP
5.0000 mL | Freq: Four times a day (QID) | OROMUCOSAL | 0 refills | Status: DC
  Filled 2022-11-28: qty 100, 5d supply, fill #0

## 2022-11-28 NOTE — Telephone Encounter (Signed)
Requested Prescriptions  Pending Prescriptions Disp Refills   fluticasone-salmeterol (ADVAIR) 250-50 MCG/ACT AEPB 60 each 2    Sig: Inhale 1 puff into the lungs in the morning and at bedtime.     Pulmonology:  Combination Products Passed - 11/28/2022 10:43 AM      Passed - Valid encounter within last 12 months    Recent Outpatient Visits           3 months ago Essential hypertension   [place] [facility] Marcine Matar, MD   7 months ago Essential hypertension   [place] [facility] Jonah Blue B, MD   8 months ago Rib pain on left side   [place] [facility] Hoy Register, MD   8 months ago Essential hypertension   [facility] [place], Cornelius Moras, RPH-CPP   11 months ago Essential hypertension   [place] [facility] Marcine Matar, MD       Future Appointments             In 3 weeks Marcine Matar, MD [place] [facility]             nystatin (MYCOSTATIN) 100000 UNIT/ML suspension 100 mL 0    Sig: SWISH AND SWALLOW 5 MLS (500,000 UNITS TOTAL) BY MOUTH 4 (FOUR) TIMES DAILY.     Off-Protocol Failed - 11/28/2022 10:43 AM      Failed - Medication not assigned to a protocol, review manually.      Passed - Valid encounter within last 12 months    Recent Outpatient Visits           3 months ago Essential hypertension   [place] [facility] Marcine Matar, MD   7 months ago Essential hypertension   [place] [facility] Marcine Matar, MD   8 months ago Rib pain on left side   [place] [facility] Hoy Register, MD   8 months ago Essential hypertension   [facility] [place], Cornelius Moras, RPH-CPP   11 months ago Essential hypertension   [place] [facility] &  [facility] Marcine Matar, MD       Future Appointments             In 3 weeks Marcine Matar, MD [facility]

## 2022-11-28 NOTE — Telephone Encounter (Signed)
Requested medications are due for refill today.  unsure  Requested medications are on the active medications list.  yes  Last refill. 08/25/2022 0 rf  Future visit scheduled.   yes  Notes to clinic.  Refill not delegated.    Requested Prescriptions  Pending Prescriptions Disp Refills   nystatin (MYCOSTATIN) 100000 UNIT/ML suspension 100 mL 0    Sig: SWISH AND SWALLOW 5 MLS (500,000 UNITS TOTAL) BY MOUTH 4 (FOUR) TIMES DAILY.     Off-Protocol Failed - 11/28/2022 10:43 AM      Failed - Medication not assigned to a protocol, review manually.      Passed - Valid encounter within last 12 months    Recent Outpatient Visits           3 months ago Essential hypertension   [place] [facility] Marcine Matar, MD   7 months ago Essential hypertension   [place] [facility] Marcine Matar, MD   8 months ago Rib pain on left side   [facility] Hoy Register, MD   8 months ago Essential hypertension   [facility] [place], Cornelius Moras, RPH-CPP   11 months ago Essential hypertension   [place] [facility] Marcine Matar, MD       Future Appointments             In 3 weeks Marcine Matar, MD [place] [facility]            Signed Prescriptions Disp Refills   fluticasone-salmeterol (ADVAIR) 250-50 MCG/ACT AEPB 60 each 2    Sig: Inhale 1 puff into the lungs in the morning and at bedtime.     Pulmonology:  Combination Products Passed - 11/28/2022 10:43 AM      Passed - Valid encounter within last 12 months    Recent Outpatient Visits           3 months ago Essential hypertension   [place] [facility] Marcine Matar, MD   7 months ago Essential hypertension   [facility] Marcine Matar, MD   8 months ago Rib pain  on left side   [place] [facility] Hoy Register, MD   8 months ago Essential hypertension   [facility] [place], Cornelius Moras, RPH-CPP   11 months ago Essential hypertension   [place] [facility] Marcine Matar, MD       Future Appointments             In 3 weeks Marcine Matar, MD [facility]

## 2022-12-01 ENCOUNTER — Other Ambulatory Visit (HOSPITAL_COMMUNITY): Payer: Self-pay

## 2022-12-01 ENCOUNTER — Other Ambulatory Visit: Payer: Self-pay

## 2022-12-01 MED ORDER — PEG 3350-KCL-NA BICARB-NACL 420 G PO SOLR
ORAL | 0 refills | Status: DC
  Filled 2022-12-01: qty 4000, 1d supply, fill #0

## 2022-12-02 DIAGNOSIS — D124 Benign neoplasm of descending colon: Secondary | ICD-10-CM | POA: Diagnosis not present

## 2022-12-02 DIAGNOSIS — K5730 Diverticulosis of large intestine without perforation or abscess without bleeding: Secondary | ICD-10-CM | POA: Diagnosis not present

## 2022-12-02 DIAGNOSIS — K635 Polyp of colon: Secondary | ICD-10-CM | POA: Diagnosis not present

## 2022-12-02 DIAGNOSIS — Z09 Encounter for follow-up examination after completed treatment for conditions other than malignant neoplasm: Secondary | ICD-10-CM | POA: Diagnosis not present

## 2022-12-02 DIAGNOSIS — D120 Benign neoplasm of cecum: Secondary | ICD-10-CM | POA: Diagnosis not present

## 2022-12-02 DIAGNOSIS — D125 Benign neoplasm of sigmoid colon: Secondary | ICD-10-CM | POA: Diagnosis not present

## 2022-12-02 DIAGNOSIS — K648 Other hemorrhoids: Secondary | ICD-10-CM | POA: Diagnosis not present

## 2022-12-02 DIAGNOSIS — Z860100 Personal history of colon polyps, unspecified: Secondary | ICD-10-CM | POA: Diagnosis not present

## 2022-12-02 LAB — HM COLONOSCOPY

## 2022-12-04 DIAGNOSIS — D125 Benign neoplasm of sigmoid colon: Secondary | ICD-10-CM | POA: Diagnosis not present

## 2022-12-04 DIAGNOSIS — D120 Benign neoplasm of cecum: Secondary | ICD-10-CM | POA: Diagnosis not present

## 2022-12-04 DIAGNOSIS — K635 Polyp of colon: Secondary | ICD-10-CM | POA: Diagnosis not present

## 2022-12-04 DIAGNOSIS — D124 Benign neoplasm of descending colon: Secondary | ICD-10-CM | POA: Diagnosis not present

## 2022-12-10 ENCOUNTER — Encounter: Payer: Self-pay | Admitting: Internal Medicine

## 2022-12-10 NOTE — Progress Notes (Signed)
I received colonoscopy report.  Colonoscopy done 12/02/2022 by Dr. [name] with [facility] gastroenterology. Patient 7 polyps removed.  Largest one was 20 mm.  Also showed internal hemorrhoids and diverticulosis in the descending colon.  Pathology showed 6 tubular adenoma.  Gastroenterologist has recommended repeat colonoscopy in 2 years.

## 2022-12-11 ENCOUNTER — Other Ambulatory Visit: Payer: Self-pay

## 2022-12-11 ENCOUNTER — Other Ambulatory Visit: Payer: Self-pay | Admitting: Internal Medicine

## 2022-12-11 DIAGNOSIS — J449 Chronic obstructive pulmonary disease, unspecified: Secondary | ICD-10-CM

## 2022-12-11 MED ORDER — ALBUTEROL SULFATE HFA 108 (90 BASE) MCG/ACT IN AERS
2.0000 | INHALATION_SPRAY | Freq: Four times a day (QID) | RESPIRATORY_TRACT | 0 refills | Status: DC | PRN
  Filled 2022-12-11: qty 6.7, 25d supply, fill #0

## 2022-12-12 ENCOUNTER — Other Ambulatory Visit: Payer: Self-pay

## 2022-12-17 ENCOUNTER — Encounter: Payer: Self-pay | Admitting: Internal Medicine

## 2022-12-17 NOTE — Progress Notes (Signed)
Pathology report received from [facility] gastroenterology.  Patient had colonoscopy done 12/02/2022.  Pathology revealed 5 tubular adenomas in the sigmoid and descending colon, 1 tubular adenoma in the cecum and 1 hyperplastic polyp in the sigmoid.

## 2022-12-19 ENCOUNTER — Other Ambulatory Visit: Payer: Self-pay

## 2022-12-21 ENCOUNTER — Other Ambulatory Visit: Payer: Self-pay | Admitting: Internal Medicine

## 2022-12-21 DIAGNOSIS — Z760 Encounter for issue of repeat prescription: Secondary | ICD-10-CM

## 2022-12-22 NOTE — Telephone Encounter (Signed)
Requested medication (s) are due for refill today:   Yes  Requested medication (s) are on the active medication list:   Yes  Future visit scheduled:   Yes 10/24 with Dr. Laural Benes   Last ordered: 04/15/2022 #100, 2 refills  Returned because labs are due so unable to refill.   Requested Prescriptions  Pending Prescriptions Disp Refills   meloxicam (MOBIC) 15 MG tablet [Pharmacy Med Name: Meloxicam 15 MG Oral Tablet] 100 tablet 2    Sig: TAKE 1 TABLET BY MOUTH DAILY     Analgesics:  COX2 Inhibitors Failed - 12/21/2022  5:23 AM      Failed - Manual Review: Labs are only required if the patient has taken medication for more than 8 weeks.      Failed - HGB in normal range and within 360 days    Hemoglobin  Date Value Ref Range Status  12/12/2021 11.3 11.1 - 15.9 g/dL Final   HGB  Date Value Ref Range Status  06/12/2008 13.8 11.6 - 15.9 g/dL Final         Failed - Cr in normal range and within 360 days    Creat  Date Value Ref Range Status  04/17/2016 0.77 0.50 - 1.05 mg/dL Final    Comment:      For patients > or = 66 years of age: The upper reference limit for Creatinine is approximately 13% higher for people identified as African-American.      Creatinine, Ser  Date Value Ref Range Status  10/30/2021 0.84 0.44 - 1.00 mg/dL Final         Failed - HCT in normal range and within 360 days    HCT  Date Value Ref Range Status  06/12/2008 40.2 34.8 - 46.6 % Final   Hematocrit  Date Value Ref Range Status  12/12/2021 34.4 34.0 - 46.6 % Final         Failed - AST in normal range and within 360 days    AST  Date Value Ref Range Status  10/22/2021 20 15 - 41 U/L Final         Failed - ALT in normal range and within 360 days    ALT  Date Value Ref Range Status  10/22/2021 25 0 - 44 U/L Final         Failed - eGFR is 30 or above and within 360 days    GFR, Est African American  Date Value Ref Range Status  04/17/2016 >89 >=60 mL/min Final   GFR calc Af Amer   Date Value Ref Range Status  04/28/2019 93 >59 mL/min/1.73 Final   GFR, Est Non African American  Date Value Ref Range Status  04/17/2016 85 >=60 mL/min Final   GFR, Estimated  Date Value Ref Range Status  10/30/2021 >60 >60 mL/min Final    Comment:    (NOTE) Calculated using the CKD-EPI Creatinine Equation (2021)    eGFR  Date Value Ref Range Status  08/20/2021 76 >59 mL/min/1.73 Final         Passed - Patient is not pregnant      Passed - Valid encounter within last 12 months    Recent Outpatient Visits           3 months ago Essential hypertension   [place] [facility] Marcine Matar, MD   8 months ago Essential hypertension   [place] [facility] Marcine Matar, MD   8  months ago Rib pain on left side   [place] [facility] [place], Odette Horns, MD   9 months ago Essential hypertension   [facility] [place], Cornelius Moras, RPH-CPP   1 year ago Essential hypertension    [facility] Marcine Matar, MD       Future Appointments             In 3 days Marcine Matar, MD [facility]

## 2022-12-25 ENCOUNTER — Other Ambulatory Visit: Payer: Self-pay

## 2022-12-25 ENCOUNTER — Ambulatory Visit: Payer: 59 | Attending: Internal Medicine | Admitting: Internal Medicine

## 2022-12-25 VITALS — BP 129/84 | HR 65 | Temp 97.9°F | Ht 65.0 in | Wt 199.0 lb

## 2022-12-25 DIAGNOSIS — Z23 Encounter for immunization: Secondary | ICD-10-CM | POA: Diagnosis not present

## 2022-12-25 DIAGNOSIS — E785 Hyperlipidemia, unspecified: Secondary | ICD-10-CM | POA: Diagnosis not present

## 2022-12-25 DIAGNOSIS — J449 Chronic obstructive pulmonary disease, unspecified: Secondary | ICD-10-CM | POA: Diagnosis not present

## 2022-12-25 DIAGNOSIS — E559 Vitamin D deficiency, unspecified: Secondary | ICD-10-CM | POA: Diagnosis not present

## 2022-12-25 DIAGNOSIS — I739 Peripheral vascular disease, unspecified: Secondary | ICD-10-CM | POA: Diagnosis not present

## 2022-12-25 DIAGNOSIS — R221 Localized swelling, mass and lump, neck: Secondary | ICD-10-CM | POA: Diagnosis not present

## 2022-12-25 DIAGNOSIS — Z2821 Immunization not carried out because of patient refusal: Secondary | ICD-10-CM | POA: Diagnosis not present

## 2022-12-25 DIAGNOSIS — Z6833 Body mass index (BMI) 33.0-33.9, adult: Secondary | ICD-10-CM

## 2022-12-25 DIAGNOSIS — E66811 Obesity, class 1: Secondary | ICD-10-CM

## 2022-12-25 DIAGNOSIS — I10 Essential (primary) hypertension: Secondary | ICD-10-CM | POA: Diagnosis not present

## 2022-12-25 DIAGNOSIS — E6609 Other obesity due to excess calories: Secondary | ICD-10-CM

## 2022-12-25 MED ORDER — ATORVASTATIN CALCIUM 80 MG PO TABS
80.0000 mg | ORAL_TABLET | Freq: Every day | ORAL | 3 refills | Status: DC
  Filled 2022-12-25 – 2022-12-27 (×2): qty 90, 90d supply, fill #0

## 2022-12-25 MED ORDER — FLUTICASONE-SALMETEROL 250-50 MCG/ACT IN AEPB
1.0000 | INHALATION_SPRAY | Freq: Two times a day (BID) | RESPIRATORY_TRACT | 2 refills | Status: DC
  Filled 2022-12-25: qty 60, 30d supply, fill #0
  Filled 2023-02-12: qty 60, 30d supply, fill #1
  Filled 2023-03-24: qty 60, 30d supply, fill #2
  Filled 2023-04-22: qty 60, 30d supply, fill #3
  Filled 2023-05-26: qty 60, 30d supply, fill #4
  Filled 2023-07-02: qty 60, 30d supply, fill #5

## 2022-12-25 MED ORDER — AMLODIPINE BESYLATE 5 MG PO TABS
5.0000 mg | ORAL_TABLET | Freq: Every day | ORAL | 1 refills | Status: DC
Start: 2022-12-25 — End: 2023-09-08
  Filled 2022-12-25 – 2023-03-11 (×2): qty 90, 90d supply, fill #0
  Filled 2023-06-08: qty 90, 90d supply, fill #1

## 2022-12-25 MED ORDER — SPIRONOLACTONE 25 MG PO TABS
25.0000 mg | ORAL_TABLET | Freq: Every day | ORAL | 3 refills | Status: DC
  Filled 2022-12-25: qty 90, 90d supply, fill #0
  Filled 2023-03-11: qty 90, 90d supply, fill #1
  Filled 2023-06-19: qty 90, 90d supply, fill #2

## 2022-12-25 MED ORDER — POTASSIUM CHLORIDE ER 10 MEQ PO TBCR
20.0000 meq | EXTENDED_RELEASE_TABLET | Freq: Every day | ORAL | 1 refills | Status: DC
  Filled 2022-12-25 – 2022-12-27 (×2): qty 180, 90d supply, fill #0
  Filled 2023-04-07 – 2023-04-08 (×2): qty 180, 90d supply, fill #1

## 2022-12-25 NOTE — Progress Notes (Signed)
Patient ID: Brandy Stevenson, female    DOB: 11/03/1955  MRN: [id number]  CC: Hypertension (HTN  f/u. Med refill. /No questions / concerns/No to shingles & flu vax. Yes to Tdap vax)   Subjective: Brandy Stevenson is a 67 y.o. female who presents for chronic ds management. Her concerns today include:  Hx of PAD (axillobifemoral bypass graft BL 10/2021 ), HTN, HL, former tob dep, LT breast CA (lumpectomy and XRT 2009), GERD, COPD, Trigeminal neuralgia RT, dep/anxiety, OA shoulders   HTN:  on Norvasc 5 mg daily, Hydralazine 25mg  TID, Coreg 6.25 mg BID and Spir 25 mg daily,  did not take morning doses already for today.  Reports compliance with taking her medications.She has not been checking BP since last visit. She limits salt in the foods. No chest pains, HA, dizziness or shortness of breath or lower extremity swelling.  PAD/HL: Saw Dr. Edilia Bo 09/18/2022.  Studies done revealed that her grafts are patent.  ABIs were 95% bilaterally. Continued on Lipitor 80 mg and ASA 81 mg daily  Vitamin D deficiency: Vitamin D on last visit was 13.  She was placed on high-dose vitamin D. Taking consistently.  ACD: Found to have anemia of chronic disease about 1 yr ago.  I recommended taking iron supplement 2-3 times a week.  She has not been doing that.   Obesity:  wgh stable.  legs better since surgery for PAD.  Still gets cramps in legs sometimes but not as much as before.  Moving a little more.  Has a stationary bike which she uses about once a wk  COPD:  on Advair and Spiriva.  Not using the Spiriva every day because it causes dry mouth and crackle of voice.  Using Spiriva PRN. Back to using Albuterol about every 6 hrs.  Complains of fullness on the left side of the neck ever since she had her vascular surgery in August of last year.  Surgeon went into the artery under the left collarbone.  Some soreness in the area and aching in the LT arm at times.  No swelling of the arm.  HM: She declines  shingles vaccine, flu and PCV vaccines.  Yes to Tdap.   Patient Active Problem List   Diagnosis Date Noted   Osteopenia of femoral neck 10/22/2022   Prediabetes 06/15/2021   Influenza vaccine refused 02/03/2020   Resistant hypertension 01/20/2020   Tubular adenoma of colon 04/27/2019   Cervical radiculopathy 10/01/2018   Hypertensive retinopathy 09/08/2018   Seasonal allergies 06/29/2018   Aortoiliac occlusive disease (HCC) 11/04/2017   DOE (dyspnea on exertion) 11/03/2017   Chronic chest wall pain 11/03/2017   Ichthyosis 10/06/2017   Former smoker 04/07/2017   Primary osteoarthritis of right shoulder 04/07/2017   Chronic midline low back pain without sciatica 04/07/2017   Hiatal hernia 05/01/2016   DJD (degenerative joint disease) of cervical spine 04/17/2016   History of breast cancer 02/18/2016   Foot callus 02/18/2016   Anxiety and depression 05/22/2015   Insomnia 03/15/2015   Vitamin D deficiency 04/08/2013   Hyperlipidemia with target LDL less than 70 04/08/2013   Atherosclerotic PVD with intermittent claudication (HCC) 03/22/2013   Accelerated hypertension 02/23/2013   GERD (gastroesophageal reflux disease) 02/23/2013   COPD (chronic obstructive pulmonary disease) (HCC) 02/23/2013     Current Outpatient Medications on File Prior to Visit  Medication Sig Dispense Refill   acetaminophen (TYLENOL) 500 MG tablet Take 1,000 mg by mouth every 8 (eight) hours.  albuterol (PROAIR HFA) 108 (90 Base) MCG/ACT inhaler Inhale 2 puffs into the lungs every 6 (six) hours as needed for wheezing or shortness of breath. 6.7 g 3   aspirin EC 81 MG tablet Take 1 tablet (81 mg total) by mouth daily. 90 tablet 3   carvedilol (COREG) 6.25 MG tablet TAKE 1 TABLET (6.25 MG TOTAL) BY MOUTH 2 (TWO) TIMES DAILY. 180 tablet 1   diclofenac Sodium (VOLTAREN) 1 % GEL Apply 4 g topically 4 (four) times daily. 100 g 1   gabapentin (NEURONTIN) 300 MG capsule TAKE 1 CAPSULE BY MOUTH AT  BEDTIME 100  capsule 2   hydrALAZINE (APRESOLINE) 25 MG tablet Take 1 tablet (25 mg total) by mouth 3 (three) times daily. 270 tablet 0   meloxicam (MOBIC) 15 MG tablet TAKE 1 TABLET BY MOUTH DAILY 90 tablet 0   Menthol, Topical Analgesic, (BIOFREEZE) 5 % PTCH Apply 0.5 patches topically daily as needed (pain).     nystatin (MYCOSTATIN) 100000 UNIT/ML suspension SWISH AND SWALLOW 5 MLS (500,000 UNITS TOTAL) BY MOUTH 4 (FOUR) TIMES DAILY. 100 mL 0   omeprazole (PRILOSEC) 40 MG capsule TAKE 1 CAPSULE BY MOUTH DAILY 100 capsule 2   tiotropium (SPIRIVA HANDIHALER) 18 MCG inhalation capsule Place 1 capsule (18 mcg total) into inhaler and inhale daily. 30 capsule 12   Vitamin D, Ergocalciferol, (DRISDOL) 1.25 MG (50000 UNIT) CAPS capsule Take 1 capsule (50,000 Units total) by mouth every 7 (seven) days. 12 capsule 2   albuterol (PROAIR HFA) 108 (90 Base) MCG/ACT inhaler Inhale 2 puffs into the lungs every 6 (six) hours as needed for wheezing or shortness of breath. (Patient not taking: Reported on 12/25/2022) 6.7 g 0   No current facility-administered medications on file prior to visit.    Allergies  Allergen Reactions   Vicodin [Hydrocodone-Acetaminophen] Nausea And Vomiting and Other (See Comments)    Sweating   Bactrim [Sulfamethoxazole-Trimethoprim] Nausea And Vomiting    HA    Incruse Ellipta [Umeclidinium Bromide] Other (See Comments)    Dry mouth   Tramadol Nausea And Vomiting   Wellbutrin [Bupropion] Other (See Comments)    Made me feel bad and sleepy    Social History   Socioeconomic History   Marital status: Married    Spouse name: Not on file   Number of children: Not on file   Years of education: Not on file   Highest education level: 12th grade  Occupational History   Not on file  Tobacco Use   Smoking status: Former    Current packs/day: 0.00    Average packs/day: 0.3 packs/day for 40.0 years (10.0 ttl pk-yrs)    Types: Cigarettes    Start date: 02/08/1977    Quit date: 02/08/2017     Years since quitting: 5.8    Passive exposure: Never   Smokeless tobacco: Never  Vaping Use   Vaping status: Never Used  Substance and Sexual Activity   Alcohol use: No   Drug use: No   Sexual activity: Yes  Other Topics Concern   Not on file  Social History Narrative   Not on file   Social Determinants of Health   Financial Resource Strain: Low Risk  (10/21/2022)   Overall Financial Resource Strain (CARDIA)    Difficulty of Paying Living Expenses: Not hard at all  Food Insecurity: No Food Insecurity (12/25/2022)   Hunger Vital Sign    Worried About Running Out of Food in the Last Year: Never true  Ran Out of Food in the Last Year: Never true  Transportation Needs: No Transportation Needs (12/25/2022)   PRAPARE - Administrator, Civil Service (Medical): No    Lack of Transportation (Non-Medical): No  Physical Activity: Inactive (12/25/2022)   Exercise Vital Sign    Days of Exercise per Week: 0 days    Minutes of Exercise per Session: 30 min  Stress: No Stress Concern Present (12/25/2022)   [employer] of Occupational Health - Occupational Stress Questionnaire    Feeling of Stress : Only a little  Social Connections: Socially Integrated (12/25/2022)   Social Connection and Isolation Panel [NHANES]    Frequency of Communication with Friends and Family: More than three times a week    Frequency of Social Gatherings with Friends and Family: Twice a week    Attends Religious Services: More than 4 times per year    Active Member of [employer] or Organizations: Yes    Attends Banker Meetings: 1 to 4 times per year    Marital Status: Married  Catering manager Violence: Not At Risk (10/21/2022)   Humiliation, Afraid, Rape, and Kick questionnaire    Fear of Current or Ex-Partner: No    Emotionally Abused: No    Physically Abused: No    Sexually Abused: No    Family History  Problem Relation Age of Onset   Hyperlipidemia Mother    Hypertension  Mother    Heart disease Mother        Atrial Fib.   Hyperlipidemia Sister    Hypertension Sister    Heart disease Sister        Before age 4,  CHF   Heart attack Brother    Hyperlipidemia Brother    Hypertension Brother    Heart disease Brother    Hypertension Daughter     Past Surgical History:  Procedure Laterality Date   APPENDECTOMY  1980   AXILLARY-FEMORAL BYPASS GRAFT Bilateral 10/29/2021   Procedure: LEFT AXILLOBIFEMORAL BYPASS GRAFT;  Surgeon: Chuck Hint, MD;  Location: [facility] OR;  Service: Vascular;  Laterality: Bilateral;   BREAST LUMPECTOMY Left    s/p radiation therapy    BREAST SURGERY     COLONOSCOPY  07/2015   COLONOSCOPY WITH PROPOFOL N/A 06/26/2015   Procedure: COLONOSCOPY WITH PROPOFOL;  Surgeon: Charolett Bumpers, MD;  Location: [facility];  Service: Endoscopy;  Laterality: N/A;   CT CTA CORONARY W/CA SCORE W/CM &/OR WO/CM  12/10/2017   Coronary calcium score is 0.  No evidence of CAD.   PERIPHERAL VASCULAR CATHETERIZATION N/A 10/22/2015   Procedure: Abdominal Aortogram w/Lower Extremity;  Surgeon: Chuck Hint, MD;  [facility] INVASIVE CV LAB: Aortic arch widely patent as far as innominate artery, right subclavian artery, right vertebral artery and right, common carotid.  Bilateral single renal arteries - Nonocclusive disease. Infrarenal aorta 100% - bilateral common Iliac 100% --> B Ex Iliacs fill via Int Iliac. Leg OK   TRANSTHORACIC ECHOCARDIOGRAM  08/2017   EF 60-65%. No RWMA.  Normal DF.  Normal valves.    TUBAL LIGATION  1991    ROS: Review of Systems Negative except as stated above  PHYSICAL EXAM: BP 129/84 (BP Location: Left Arm, Patient Position: Sitting, Cuff Size: Normal)   Pulse 65   Temp 97.9 F (36.6 C) (Axillary)   Ht 5\' 5"  (1.651 m)   Wt 199 lb (90.3 kg)   SpO2 97%   BMI 33.12 kg/m   Wt Readings from Last  3 Encounters:  12/25/22 199 lb (90.3 kg)  10/21/22 196 lb (88.9 kg)  09/18/22 196 lb 11.2 oz (89.2 kg)   BP  150/82 Physical Exam  General appearance - alert, well appearing, older African-American female and in no distress Mental status - normal mood, behavior, speech, dress, motor activity, and thought processes Neck - supple, no significant adenopathy.  Moderate Fullness on the left lateral side of the neck and the soft tissue above the clavicle when compared to the right side.  However no distinct mass felt.. Chest - clear to auscultation, no wheezes, rales or rhonchi, symmetric air entry Heart - normal rate, regular rhythm, normal S1, S2, no murmurs, rubs, clicks or gallops Extremities - peripheral pulses in the upper extremities normal and equal bilaterally.  No edema noted of the arms or lower extremities.       Latest Ref Rng & Units 10/30/2021    5:30 AM 10/22/2021    9:24 AM 08/20/2021    9:29 AM  CMP  Glucose 70 - 99 mg/dL 161  92  90   BUN 8 - 23 mg/dL 10  10  12    Creatinine 0.44 - 1.00 mg/dL 0.96  0.45  4.09   Sodium 135 - 145 mmol/L 137  139  141   Potassium 3.5 - 5.1 mmol/L 3.7  3.5  4.3   Chloride 98 - 111 mmol/L 100  107  104   CO2 22 - 32 mmol/L 24  23  21    Calcium 8.9 - 10.3 mg/dL 9.0  9.0  9.5   Total Protein 6.5 - 8.1 g/dL  7.6    Total Bilirubin 0.3 - 1.2 mg/dL  0.4    Alkaline Phos 38 - 126 U/L  66    AST 15 - 41 U/L  20    ALT 0 - 44 U/L  25     Lipid Panel     Component Value Date/Time   CHOL 117 10/30/2021 0530   CHOL 150 06/14/2021 1458   TRIG 49 10/30/2021 0530   HDL 54 10/30/2021 0530   HDL 67 06/14/2021 1458   CHOLHDL 2.2 10/30/2021 0530   VLDL 10 10/30/2021 0530   LDLCALC 53 10/30/2021 0530   LDLCALC 65 06/14/2021 1458    CBC    Component Value Date/Time   WBC 6.1 12/12/2021 1501   WBC 8.2 11/01/2021 1206   RBC 3.57 (L) 12/12/2021 1501   RBC 2.94 (L) 11/01/2021 1206   HGB 11.3 12/12/2021 1501   HGB 13.8 06/12/2008 0939   HCT 34.4 12/12/2021 1501   HCT 40.2 06/12/2008 0939   PLT 229 12/12/2021 1501   MCV 96 12/12/2021 1501   MCV 90.1  06/12/2008 0939   MCH 31.7 12/12/2021 1501   MCH 32.7 11/01/2021 1206   MCHC 32.8 12/12/2021 1501   MCHC 32.9 11/01/2021 1206   RDW 12.7 12/12/2021 1501   RDW 13.9 06/12/2008 0939   LYMPHSABS 1.8 08/07/2021 0940   LYMPHSABS 0.4 (L) 03/10/2017 1335   LYMPHSABS 1.5 06/12/2008 0939   MONOABS 0.5 08/07/2021 0940   MONOABS 0.6 06/12/2008 0939   EOSABS 0.1 08/07/2021 0940   EOSABS 0.1 03/10/2017 1335   BASOSABS 0.1 08/07/2021 0940   BASOSABS 0.0 03/10/2017 1335   BASOSABS 0.0 06/12/2008 0939    ASSESSMENT AND PLAN: 1. Essential hypertension Diastolic blood pressure slightly above goal.  No changes made to medications as she has not taken her blood pressure medicines as yet for the morning. Continue Norvasc  5 mg daily, Hydralazine 25mg  TID, Coreg 6.25 mg BID and Spir 25 mg daily, - spironolactone (ALDACTONE) 25 MG tablet; Take 1 tablet (25 mg total) by mouth daily.  Dispense: 90 tablet; Refill: 3 - amLODipine (NORVASC) 5 MG tablet; Take 1 tablet (5 mg total) by mouth daily.  Dispense: 90 tablet; Refill: 1 - CBC - Comprehensive metabolic panel - potassium chloride (KLOR-CON) 10 MEQ tablet; Take 2 tablets (20 mEq total) by mouth daily.  Dispense: 180 tablet; Refill: 1  2. PAD (peripheral artery disease) (HCC) Doing well with minimal to no claudication symptoms.  Continue atorvastatin and aspirin.  3. Hyperlipidemia, unspecified hyperlipidemia type - atorvastatin (LIPITOR) 80 MG tablet; Take 1 tablet (80 mg total) by mouth at bedtime.  Dispense: 90 tablet; Refill: 3 - Lipid panel  4. Vitamin D deficiency Continue high-dose vitamin D once a week. - VITAMIN D 25 Hydroxy (Vit-D Deficiency, Fractures)  5. Neck fullness - CT SOFT TISSUE NECK W CONTRAST; Future  6. Obesity (BMI 30.0-34.9) Encouraged her to use her stationary bike a bit more.  Aim for at least 3 times a week for 30 minutes.  Encourage healthy eating.  7. Chronic obstructive pulmonary disease, unspecified COPD type  (HCC) - fluticasone-salmeterol (ADVAIR) 250-50 MCG/ACT AEPB; Inhale 1 puff into the lungs in the morning and at bedtime.  Dispense: 90 each; Refill: 2  8. Need for Tdap vaccination Given today  9. Influenza vaccination declined   10. 23-polyvalent pneumococcal polysaccharide vaccine declined    Patient was given the opportunity to ask questions.  Patient verbalized understanding of the plan and was able to repeat key elements of the plan.   This documentation was completed using Paediatric nurse.  Any transcriptional errors are unintentional.  Orders Placed This Encounter  Procedures   CT SOFT TISSUE NECK W CONTRAST   Tdap vaccine greater than or equal to 7yo IM   CBC   Comprehensive metabolic panel   Lipid panel   VITAMIN D 25 Hydroxy (Vit-D Deficiency, Fractures)     Requested Prescriptions   Signed Prescriptions Disp Refills   spironolactone (ALDACTONE) 25 MG tablet 90 tablet 3    Sig: Take 1 tablet (25 mg total) by mouth daily.   amLODipine (NORVASC) 5 MG tablet 90 tablet 1    Sig: Take 1 tablet (5 mg total) by mouth daily.   atorvastatin (LIPITOR) 80 MG tablet 90 tablet 3    Sig: Take 1 tablet (80 mg total) by mouth at bedtime.   potassium chloride (KLOR-CON) 10 MEQ tablet 180 tablet 1    Sig: Take 2 tablets (20 mEq total) by mouth daily.   fluticasone-salmeterol (ADVAIR) 250-50 MCG/ACT AEPB 120 each 2    Sig: Inhale 1 puff into the lungs in the morning and at bedtime.    Return in about 4 months (around 04/27/2023).  Jonah Blue, MD, FACP

## 2022-12-26 LAB — LIPID PANEL
Chol/HDL Ratio: 2.1 ratio (ref 0.0–4.4)
Cholesterol, Total: 146 mg/dL (ref 100–199)
HDL: 69 mg/dL (ref >39)
LDL Chol Calc (NIH): 62 mg/dL (ref 0–99)
Triglycerides: 77 mg/dL (ref 0–149)
VLDL Cholesterol Cal: 15 mg/dL (ref 5–40)

## 2022-12-26 LAB — CBC
Hematocrit: 35.3 % (ref 34.0–46.6)
Hemoglobin: 11.1 g/dL (ref 11.1–15.9)
MCH: 30.9 pg (ref 26.6–33.0)
MCHC: 31.4 g/dL — ABNORMAL LOW (ref 31.5–35.7)
MCV: 98 fL — ABNORMAL HIGH (ref 79–97)
Platelets: 256 10*3/uL (ref 150–450)
RBC: 3.59 x10E6/uL — ABNORMAL LOW (ref 3.77–5.28)
RDW: 12.7 % (ref 11.7–15.4)
WBC: 4.6 10*3/uL (ref 3.4–10.8)

## 2022-12-26 LAB — COMPREHENSIVE METABOLIC PANEL
ALT: 46 [IU]/L — ABNORMAL HIGH (ref 0–32)
AST: 33 [IU]/L (ref 0–40)
Albumin: 4.2 g/dL (ref 3.9–4.9)
Alkaline Phosphatase: 111 [IU]/L (ref 44–121)
BUN/Creatinine Ratio: 15 (ref 12–28)
BUN: 12 mg/dL (ref 8–27)
Bilirubin Total: 0.3 mg/dL (ref 0.0–1.2)
CO2: 26 mmol/L (ref 20–29)
Calcium: 9.3 mg/dL (ref 8.7–10.3)
Chloride: 104 mmol/L (ref 96–106)
Creatinine, Ser: 0.79 mg/dL (ref 0.57–1.00)
Globulin, Total: 3.1 g/dL (ref 1.5–4.5)
Glucose: 93 mg/dL (ref 70–99)
Potassium: 4.2 mmol/L (ref 3.5–5.2)
Sodium: 144 mmol/L (ref 134–144)
Total Protein: 7.3 g/dL (ref 6.0–8.5)
eGFR: 82 mL/min/{1.73_m2} (ref >59)

## 2022-12-26 LAB — VITAMIN D 25 HYDROXY (VIT D DEFICIENCY, FRACTURES): Vit D, 25-Hydroxy: 33.6 ng/mL (ref 30.0–100.0)

## 2022-12-27 ENCOUNTER — Other Ambulatory Visit: Payer: Self-pay | Admitting: Internal Medicine

## 2022-12-27 DIAGNOSIS — I10 Essential (primary) hypertension: Secondary | ICD-10-CM

## 2022-12-29 ENCOUNTER — Other Ambulatory Visit: Payer: Self-pay

## 2022-12-29 MED ORDER — HYDRALAZINE HCL 25 MG PO TABS
25.0000 mg | ORAL_TABLET | Freq: Three times a day (TID) | ORAL | 1 refills | Status: DC
  Filled 2022-12-29: qty 270, 90d supply, fill #0
  Filled 2023-04-07 – 2023-04-08 (×2): qty 270, 90d supply, fill #1

## 2023-01-03 ENCOUNTER — Other Ambulatory Visit: Payer: Self-pay | Admitting: Internal Medicine

## 2023-01-03 DIAGNOSIS — E785 Hyperlipidemia, unspecified: Secondary | ICD-10-CM

## 2023-01-05 ENCOUNTER — Other Ambulatory Visit: Payer: Self-pay

## 2023-01-05 NOTE — Telephone Encounter (Signed)
Requested Prescriptions  Pending Prescriptions Disp Refills   atorvastatin (LIPITOR) 80 MG tablet [Pharmacy Med Name: Atorvastatin Calcium 80 MG Oral Tablet] 100 tablet 2    Sig: TAKE 1 TABLET BY MOUTH AT  BEDTIME     Cardiovascular:  Antilipid - Statins Failed - 01/03/2023 10:06 PM      Failed - Lipid Panel in normal range within the last 12 months    Cholesterol, Total  Date Value Ref Range Status  12/25/2022 146 100 - 199 mg/dL Final   LDL Chol Calc (NIH)  Date Value Ref Range Status  12/25/2022 62 0 - 99 mg/dL Final   HDL  Date Value Ref Range Status  12/25/2022 69 >39 mg/dL Final   Triglycerides  Date Value Ref Range Status  12/25/2022 77 0 - 149 mg/dL Final         Passed - Patient is not pregnant      Passed - Valid encounter within last 12 months    Recent Outpatient Visits           1 week ago Essential hypertension   [place] [facility] Marcine Matar, MD   4 months ago Essential hypertension   [place] [facility] Marcine Matar, MD   8 months ago Essential hypertension   [place] [facility] Marcine Matar, MD   9 months ago Rib pain on left side   [place] [facility] Hoy Register, MD   9 months ago Essential hypertension   [facility] [place], Cornelius Moras, RPH-CPP

## 2023-01-09 ENCOUNTER — Other Ambulatory Visit: Payer: Self-pay

## 2023-01-09 ENCOUNTER — Other Ambulatory Visit: Payer: Self-pay | Admitting: Internal Medicine

## 2023-01-12 ENCOUNTER — Other Ambulatory Visit: Payer: Self-pay

## 2023-01-19 ENCOUNTER — Other Ambulatory Visit: Payer: Self-pay | Admitting: Internal Medicine

## 2023-01-19 DIAGNOSIS — J449 Chronic obstructive pulmonary disease, unspecified: Secondary | ICD-10-CM

## 2023-01-20 ENCOUNTER — Other Ambulatory Visit: Payer: Self-pay

## 2023-01-20 MED ORDER — ALBUTEROL SULFATE HFA 108 (90 BASE) MCG/ACT IN AERS
2.0000 | INHALATION_SPRAY | Freq: Four times a day (QID) | RESPIRATORY_TRACT | 0 refills | Status: DC | PRN
  Filled 2023-01-20: qty 6.7, 25d supply, fill #0

## 2023-01-20 NOTE — Telephone Encounter (Signed)
Requested Prescriptions  Pending Prescriptions Disp Refills   nystatin (MYCOSTATIN) 100000 UNIT/ML suspension 100 mL 0    Sig: SWISH AND SWALLOW 5 MLS (500,000 UNITS TOTAL) BY MOUTH 4 (FOUR) TIMES DAILY.     Off-Protocol Failed - 01/19/2023  4:08 PM      Failed - Medication not assigned to a protocol, review manually.      Passed - Valid encounter within last 12 months    Recent Outpatient Visits           3 weeks ago Essential hypertension   [facility] [place] Marcine Matar, MD   4 months ago Essential hypertension   [facility] [place] Marcine Matar, MD   9 months ago Essential hypertension   [place] [facility] [place]. [facility] Marcine Matar, MD   9 months ago Rib pain on left side   [place] [facility] [place]. [facility] Hoy Register, MD   9 months ago Essential hypertension   [place] [facility] [place] [facility] [place]. [facility] Lois Huxley, Stephen L, RPH-CPP               albuterol (PROAIR HFA) 108 (90 Base) MCG/ACT inhaler 6.7 g 0    Sig: Inhale 2 puffs into the lungs every 6 (six) hours as needed for wheezing or shortness of breath.     Pulmonology:  Beta Agonists 2 Passed - 01/19/2023  4:08 PM      Passed - Last BP in normal range    BP Readings from Last 1 Encounters:  12/25/22 129/84         Passed - Last Heart Rate in normal range    Pulse Readings from Last 1 Encounters:  12/25/22 65         Passed - Valid encounter within last 12 months    Recent Outpatient Visits           3 weeks ago Essential hypertension   [place] [facility] [place] [facility] [place]. [facility] Marcine Matar, MD   4 months ago Essential hypertension   [place] [facility] [place] [facility] [place]. [facility]  Marcine Matar, MD   9 months ago Essential hypertension   [place] [facility] [place] [facility] [place]. [facility] Marcine Matar, MD   9 months ago Rib pain on left side   [place] [facility] [place] [facility] [place]. [facility] Hoy Register, MD   9 months ago Essential hypertension   [place] [facility] [place] [facility] Drucilla Chalet, RPH-CPP

## 2023-01-20 NOTE — Telephone Encounter (Signed)
Requested medication (s) are due for refill today - yes   Requested medication (s) are on the active medication list -yes  Future visit scheduled -no  Last refill: 11/28/22  Notes to clinic: off protocol- provider review   Requested Prescriptions  Pending Prescriptions Disp Refills   nystatin (MYCOSTATIN) 100000 UNIT/ML suspension 100 mL 0    Sig: SWISH AND SWALLOW 5 MLS (500,000 UNITS TOTAL) BY MOUTH 4 (FOUR) TIMES DAILY.     Off-Protocol Failed - 01/19/2023  4:08 PM      Failed - Medication not assigned to a protocol, review manually.      Passed - Valid encounter within last 12 months    Recent Outpatient Visits           3 weeks ago Essential hypertension   [facility] [place]. [facility] Marcine Matar, MD   4 months ago Essential hypertension   [facility] [place] [facility] [place]. [facility] Marcine Matar, MD   9 months ago Essential hypertension   [facility] [place] [facility] [place]. [facility] Marcine Matar, MD   9 months ago Rib pain on left side   [facility] [place] [facility] [place]. [facility] Hoy Register, MD   9 months ago Essential hypertension   [facility] [place] [facility] [place]. [facility] Lois Huxley, Cornelius Moras, RPH-CPP              Signed Prescriptions Disp Refills   albuterol (PROAIR HFA) 108 (90 Base) MCG/ACT inhaler 6.7 g 0    Sig: Inhale 2 puffs into the lungs every 6 (six) hours as needed for wheezing or shortness of breath.     Pulmonology:  Beta Agonists 2 Passed - 01/19/2023  4:08 PM      Passed - Last BP in normal range    BP Readings from Last 1 Encounters:  12/25/22 129/84         Passed - Last Heart Rate in normal range    Pulse Readings from Last 1 Encounters:  12/25/22 65         Passed - Valid encounter within last 12 months    Recent Outpatient  Visits           3 weeks ago Essential hypertension   [facility] [place] [facility] [place]. [facility] Marcine Matar, MD   4 months ago Essential hypertension   [facility] [place] [facility] [place]. [facility] Marcine Matar, MD   9 months ago Essential hypertension   [facility] [place] [facility] [place]. [facility] Marcine Matar, MD   9 months ago Rib pain on left side   [facility] [place]. [facility] Hoy Register, MD   9 months ago Essential hypertension   [facility] . [facility] Drucilla Chalet, RPH-CPP                 Requested Prescriptions  Pending Prescriptions Disp Refills   nystatin (MYCOSTATIN) 100000 UNIT/ML suspension 100 mL 0    Sig: SWISH AND SWALLOW 5 MLS (500,000 UNITS  TOTAL) BY MOUTH 4 (FOUR) TIMES DAILY.     Off-Protocol Failed - 01/19/2023  4:08 PM      Failed - Medication not assigned to a protocol, review manually.      Passed - Valid encounter within last 12 months    Recent Outpatient Visits           3 weeks ago Essential hypertension   [facility] [facility] [place] [facility] [place]. [facility] Marcine Matar, MD   4 months ago Essential hypertension   [place] [facility] [facility] [place] [facility] [place]. [facility] Marcine Matar, MD   9 months ago Essential hypertension   [facility] [facility] [place] [place]. [facility] Marcine Matar, MD   9 months ago Rib pain on left side   [facility] [facility] [place] [facility] Dept [facility] [place]. [facility] Hoy Register, MD   9 months ago Essential hypertension   [place] [facility] [place] [facility] [place]. [facility] Lois Huxley, Cornelius Moras, RPH-CPP              Signed Prescriptions Disp Refills   albuterol  (PROAIR HFA) 108 (90 Base) MCG/ACT inhaler 6.7 g 0    Sig: Inhale 2 puffs into the lungs every 6 (six) hours as needed for wheezing or shortness of breath.     Pulmonology:  Beta Agonists 2 Passed - 01/19/2023  4:08 PM      Passed - Last BP in normal range    BP Readings from Last 1 Encounters:  12/25/22 129/84         Passed - Last Heart Rate in normal range    Pulse Readings from Last 1 Encounters:  12/25/22 65         Passed - Valid encounter within last 12 months    Recent Outpatient Visits           3 weeks ago Essential hypertension   [facility] [place] [facility] [place]. [facility] Marcine Matar, MD   4 months ago Essential hypertension   [facility] [facility] [place] [facility] Dept Of [place]. [facility] Marcine Matar, MD   9 months ago Essential hypertension   [facility] [place] [place]. [facility] Marcine Matar, MD   9 months ago Rib pain on left side   [place] [facility] [place] [facility] [place]. [facility] Hoy Register, MD   9 months ago Essential hypertension   [place] [facility] [place] [facility] Dept Of [place]. [facility] Drucilla Chalet, RPH-CPP

## 2023-01-21 ENCOUNTER — Other Ambulatory Visit: Payer: Self-pay

## 2023-01-21 MED ORDER — NYSTATIN 100000 UNIT/ML MT SUSP
5.0000 mL | Freq: Four times a day (QID) | OROMUCOSAL | 0 refills | Status: DC
  Filled 2023-01-21: qty 100, 5d supply, fill #0

## 2023-02-09 DIAGNOSIS — C50912 Malignant neoplasm of unspecified site of left female breast: Secondary | ICD-10-CM | POA: Diagnosis not present

## 2023-02-12 ENCOUNTER — Other Ambulatory Visit: Payer: Self-pay

## 2023-02-12 DIAGNOSIS — C50912 Malignant neoplasm of unspecified site of left female breast: Secondary | ICD-10-CM | POA: Diagnosis not present

## 2023-02-17 DIAGNOSIS — C50912 Malignant neoplasm of unspecified site of left female breast: Secondary | ICD-10-CM | POA: Diagnosis not present

## 2023-02-18 ENCOUNTER — Other Ambulatory Visit: Payer: Self-pay

## 2023-02-18 ENCOUNTER — Other Ambulatory Visit: Payer: Self-pay | Admitting: Internal Medicine

## 2023-02-18 DIAGNOSIS — J449 Chronic obstructive pulmonary disease, unspecified: Secondary | ICD-10-CM

## 2023-02-18 MED ORDER — ALBUTEROL SULFATE HFA 108 (90 BASE) MCG/ACT IN AERS
2.0000 | INHALATION_SPRAY | Freq: Four times a day (QID) | RESPIRATORY_TRACT | 2 refills | Status: DC | PRN
  Filled 2023-02-18: qty 6.7, 25d supply, fill #0
  Filled 2023-03-24: qty 6.7, 25d supply, fill #1
  Filled 2023-04-22: qty 6.7, 25d supply, fill #2

## 2023-02-19 ENCOUNTER — Other Ambulatory Visit: Payer: Self-pay

## 2023-02-23 ENCOUNTER — Telehealth: Payer: Self-pay

## 2023-02-23 NOTE — Telephone Encounter (Signed)
Pt called with concerns about noticing swelling on left side of abdomen and mild rest pain that has been going on for about a week. She feels this is the same side where the graft was placed and is concerned, and would like to move up her 6 month f/u. I have r/s her labs and APP appt. Pt is aware.

## 2023-02-27 ENCOUNTER — Ambulatory Visit (INDEPENDENT_AMBULATORY_CARE_PROVIDER_SITE_OTHER)
Admission: RE | Admit: 2023-02-27 | Discharge: 2023-02-27 | Disposition: A | Discharge status: Home / Self Care | Payer: 59 | Source: Ambulatory Visit | Attending: Vascular Surgery | Admitting: Vascular Surgery

## 2023-02-27 ENCOUNTER — Ambulatory Visit (HOSPITAL_COMMUNITY)
Admission: RE | Admit: 2023-02-27 | Discharge: 2023-02-27 | Disposition: A | Discharge status: Home / Self Care | Payer: 59 | Source: Ambulatory Visit | Attending: Vascular Surgery | Admitting: Vascular Surgery

## 2023-02-27 DIAGNOSIS — I739 Peripheral vascular disease, unspecified: Secondary | ICD-10-CM | POA: Diagnosis not present

## 2023-02-27 DIAGNOSIS — I7409 Other arterial embolism and thrombosis of abdominal aorta: Secondary | ICD-10-CM | POA: Diagnosis not present

## 2023-02-27 LAB — VAS US ABI WITH/WO TBI
Left ABI: 0.83
Right ABI: 0.82

## 2023-03-02 ENCOUNTER — Other Ambulatory Visit: Payer: Self-pay

## 2023-03-05 ENCOUNTER — Ambulatory Visit: Payer: 59

## 2023-03-11 ENCOUNTER — Other Ambulatory Visit: Payer: Self-pay | Admitting: Internal Medicine

## 2023-03-11 ENCOUNTER — Other Ambulatory Visit: Payer: Self-pay

## 2023-03-11 DIAGNOSIS — I10 Essential (primary) hypertension: Secondary | ICD-10-CM

## 2023-03-11 MED ORDER — CARVEDILOL 6.25 MG PO TABS
6.2500 mg | ORAL_TABLET | Freq: Two times a day (BID) | ORAL | 1 refills | Status: DC
  Filled 2023-03-11: qty 180, 90d supply, fill #0
  Filled 2023-06-08: qty 180, 90d supply, fill #1

## 2023-03-13 ENCOUNTER — Ambulatory Visit (INDEPENDENT_AMBULATORY_CARE_PROVIDER_SITE_OTHER): Payer: 59 | Admitting: Physician Assistant

## 2023-03-13 ENCOUNTER — Other Ambulatory Visit: Payer: Self-pay

## 2023-03-13 ENCOUNTER — Encounter: Payer: Self-pay | Admitting: Physician Assistant

## 2023-03-13 VITALS — BP 145/92 | HR 64 | Temp 98.2°F | Ht 65.0 in | Wt 205.8 lb

## 2023-03-13 DIAGNOSIS — I739 Peripheral vascular disease, unspecified: Secondary | ICD-10-CM | POA: Diagnosis not present

## 2023-03-13 DIAGNOSIS — I7409 Other arterial embolism and thrombosis of abdominal aorta: Secondary | ICD-10-CM | POA: Diagnosis not present

## 2023-03-13 NOTE — Progress Notes (Signed)
 Office Note     CC:  follow up Requesting Provider:  Vicci Barnie NOVAK, MD  HPI: Brandy Stevenson is a 68 y.o. ([birth year]) female who presents for surveillance of PAD.  She underwent left axillobifemoral bypass on 02/13/2022 by Dr. Eliza.  This was performed due to rest pain and claudication symptoms.  She reports no return of symptoms.  She is without rest pain or tissue loss.  She walks is much as possible.  She takes a daily aspirin  and statin.  She quit smoking in 2018.   Past Medical History:  Diagnosis Date   Arthritis    back problems to be evaluated by neurology next month   Cancer [facility]) 2009   L, Breast, radiation and surgery.   COPD (chronic obstructive pulmonary disease) (HCC)    Depression    GERD (gastroesophageal reflux disease)    Hyperlipemia    Hypertension    Leriche syndrome (HCC)    Infrarenal aortic occlusion extending to bilateral internal iliac arteries.  External iliacs fill via collaterals from lumbar arteries to hypogastric/internal iliac arteries -minimal disease on the right side and no disease on the left side leg arteries distally.   Peripheral vascular disease (HCC) 2013   Pre-diabetes     Past Surgical History:  Procedure Laterality Date   APPENDECTOMY  1980   AXILLARY-FEMORAL BYPASS GRAFT Bilateral 10/29/2021   Procedure: LEFT AXILLOBIFEMORAL BYPASS GRAFT;  Surgeon: Eliza Lonni RAMAN, MD;  Location[facility] OR;  Service: Vascular;  Laterality: Bilateral;   BREAST LUMPECTOMY Left    s/p radiation therapy    BREAST SURGERY     COLONOSCOPY  07/2015   COLONOSCOPY WITH PROPOFOL  N/A 06/26/2015   Procedure: COLONOSCOPY WITH PROPOFOL ;  Surgeon: Gladis MARLA Vicci, MD;  Location: [facility] ENDOSCOPY;  Service: Endoscopy;  Laterality: N/A;   CT CTA CORONARY W/CA SCORE W/CM &/OR WO/CM  12/10/2017   Coronary calcium  score is 0.  No evidence of CAD.   PERIPHERAL VASCULAR CATHETERIZATION N/A 10/22/2015   Procedure: Abdominal Aortogram w/Lower Extremity;   Surgeon: Lonni RAMAN Eliza, MD;  Location: [facility] INVASIVE CV LAB: Aortic arch widely patent as far as innominate artery, right subclavian artery, right vertebral artery and right, common carotid.  Bilateral single renal arteries - Nonocclusive disease. Infrarenal aorta 100% - bilateral common Iliac 100% --> B Ex Iliacs fill via Int Iliac. Leg OK   TRANSTHORACIC ECHOCARDIOGRAM  08/2017   EF 60-65%. No RWMA.  Normal DF.  Normal valves.    TUBAL LIGATION  1991    Social History   Socioeconomic History   Marital status: Married    Spouse name: Not on file   Number of children: Not on file   Years of education: Not on file   Highest education level: 12th grade  Occupational History   Not on file  Tobacco Use   Smoking status: Former    Current packs/day: 0.00    Average packs/day: 0.3 packs/day for 40.0 years (10.0 ttl pk-yrs)    Types: Cigarettes    Start date: 02/08/1977    Quit date: 02/08/2017    Years since quitting: 6.0    Passive exposure: Never   Smokeless tobacco: Never  Vaping Use   Vaping status: Never Used  Substance and Sexual Activity   Alcohol use: No   Drug use: No   Sexual activity: Yes  Other Topics Concern   Not on file  Social History Narrative   Not on file   Social Drivers of Health  Financial Resource Strain: Low Risk  (10/21/2022)   Overall Financial Resource Strain (CARDIA)    Difficulty of Paying Living Expenses: Not hard at all  Food Insecurity: No Food Insecurity (12/25/2022)   Hunger Vital Sign    Worried About Running Out of Food in the Last Year: Never true    Ran Out of Food in the Last Year: Never true  Transportation Needs: No Transportation Needs (12/25/2022)   PRAPARE - Administrator, Civil Service (Medical): No    Lack of Transportation (Non-Medical): No  Physical Activity: Inactive (12/25/2022)   Exercise Vital Sign    Days of Exercise per Week: 0 days    Minutes of Exercise per Session: 30 min  Stress: No Stress  Concern Present (12/25/2022)   [employer] of Occupational Health - Occupational Stress Questionnaire    Feeling of Stress : Only a little  Social Connections: Socially Integrated (12/25/2022)   Social Connection and Isolation Panel [NHANES]    Frequency of Communication with Friends and Family: More than three times a week    Frequency of Social Gatherings with Friends and Family: Twice a week    Attends Religious Services: More than 4 times per year    Active Member of [employer] or Organizations: Yes    Attends Banker Meetings: 1 to 4 times per year    Marital Status: Married  Catering Manager Violence: Not At Risk (10/21/2022)   Humiliation, Afraid, Rape, and Kick questionnaire    Fear of Current or Ex-Partner: No    Emotionally Abused: No    Physically Abused: No    Sexually Abused: No    Family History  Problem Relation Age of Onset   Hyperlipidemia Mother    Hypertension Mother    Heart disease Mother        Atrial Fib.   Hyperlipidemia Sister    Hypertension Sister    Heart disease Sister        Before age 50,  CHF   Heart attack Brother    Hyperlipidemia Brother    Hypertension Brother    Heart disease Brother    Hypertension Daughter     Current Outpatient Medications  Medication Sig Dispense Refill   acetaminophen  (TYLENOL ) 500 MG tablet Take 1,000 mg by mouth every 8 (eight) hours.     albuterol  (PROAIR  HFA) 108 (90 Base) MCG/ACT inhaler Inhale 2 puffs into the lungs every 6 (six) hours as needed for wheezing or shortness of breath. 6.7 g 2   amLODipine  (NORVASC ) 5 MG tablet Take 1 tablet (5 mg total) by mouth daily. 90 tablet 1   aspirin  EC 81 MG tablet Take 1 tablet (81 mg total) by mouth daily. 90 tablet 3   atorvastatin  (LIPITOR ) 80 MG tablet TAKE 1 TABLET BY MOUTH AT  BEDTIME 100 tablet 2   carvedilol  (COREG ) 6.25 MG tablet Take 1 tablet (6.25 mg total) by mouth 2 (two) times daily. 180 tablet 1   diclofenac  Sodium (VOLTAREN ) 1 % GEL Apply 4  g topically 4 (four) times daily. 100 g 1   fluticasone -salmeterol (ADVAIR ) 250-50 MCG/ACT AEPB Inhale 1 puff into the lungs in the morning and at bedtime. 120 each 2   gabapentin  (NEURONTIN ) 300 MG capsule TAKE 1 CAPSULE BY MOUTH AT  BEDTIME 100 capsule 2   hydrALAZINE  (APRESOLINE ) 25 MG tablet Take 1 tablet (25 mg total) by mouth 3 (three) times daily. 270 tablet 1   meloxicam  (MOBIC ) 15 MG tablet TAKE 1  TABLET BY MOUTH DAILY 90 tablet 0   Menthol, Topical Analgesic, (BIOFREEZE) 5 % PTCH Apply 0.5 patches topically daily as needed (pain).     nystatin  (MYCOSTATIN ) 100000 UNIT/ML suspension SWISH AND SWALLOW 5 MLS (500,000 UNITS TOTAL) BY MOUTH 4 (FOUR) TIMES DAILY. 100 mL 0   omeprazole  (PRILOSEC) 40 MG capsule TAKE 1 CAPSULE BY MOUTH DAILY 100 capsule 2   potassium chloride  (KLOR-CON ) 10 MEQ tablet Take 2 tablets (20 mEq total) by mouth daily. 180 tablet 1   spironolactone  (ALDACTONE ) 25 MG tablet Take 1 tablet (25 mg total) by mouth daily. 90 tablet 3   tiotropium (SPIRIVA  HANDIHALER) 18 MCG inhalation capsule Place 1 capsule (18 mcg total) into inhaler and inhale daily. 30 capsule 12   Vitamin D , Ergocalciferol , (DRISDOL ) 1.25 MG (50000 UNIT) CAPS capsule Take 1 capsule (50,000 Units total) by mouth every 7 (seven) days. 12 capsule 2   albuterol  (PROAIR  HFA) 108 (90 Base) MCG/ACT inhaler Inhale 2 puffs into the lungs every 6 (six) hours as needed for wheezing or shortness of breath. 6.7 g 3   No current facility-administered medications for this visit.    Allergies  Allergen Reactions   Vicodin [Hydrocodone -Acetaminophen ] Nausea And Vomiting and Other (See Comments)    Sweating   Bactrim  [Sulfamethoxazole -Trimethoprim ] Nausea And Vomiting    HA    Incruse Ellipta  [Umeclidinium Bromide ] Other (See Comments)    Dry mouth   Tramadol  Nausea And Vomiting   Wellbutrin  [Bupropion ] Other (See Comments)    Made me feel bad and sleepy     REVIEW OF SYSTEMS:   [X]  denotes positive finding,  [ ]  denotes negative finding Cardiac  Comments:  Chest pain or chest pressure:    Shortness of breath upon exertion:    Short of breath when lying flat:    Irregular heart rhythm:        Vascular    Pain in calf, thigh, or hip brought on by ambulation:    Pain in feet at night that wakes you up from your sleep:     Blood clot in your veins:    Leg swelling:         Pulmonary    Oxygen at home:    Productive cough:     Wheezing:         Neurologic    Sudden weakness in arms or legs:     Sudden numbness in arms or legs:     Sudden onset of difficulty speaking or slurred speech:    Temporary loss of vision in one eye:     Problems with dizziness:         Gastrointestinal    Blood in stool:     Vomited blood:         Genitourinary    Burning when urinating:     Blood in urine:        Psychiatric    Major depression:         Hematologic    Bleeding problems:    Problems with blood clotting too easily:        Skin    Rashes or ulcers:        Constitutional    Fever or chills:      PHYSICAL EXAMINATION:  Vitals:   03/13/23 0938  BP: (!) 145/92  Pulse: 64  Temp: 98.2 F (36.8 C)  TempSrc: Temporal  SpO2: 95%  Weight: 205 lb 12.8 oz (93.4 kg)  Height: 5' 5 (1.651  m)    General:  WDWN in NAD; vital signs documented above Gait: Not observed HENT: WNL, normocephalic Pulmonary: normal non-labored breathing , without Rales, rhonchi,  wheezing Cardiac: regular HR Abdomen: soft, NT, no masses Skin: without rashes Vascular Exam/Pulses: Palpable DP pulses Extremities: without ischemic changes, without Gangrene , without cellulitis; without open wounds;  Musculoskeletal: no muscle wasting or atrophy  Neurologic: A&O X 3 Psychiatric:  The pt has Normal affect.   Non-Invasive Vascular Imaging:   Widely patent axillofemoral bypass.  Elevated velocity at the distal anastomosis over 300cm/s however no disease appreciated  Widely patent femorofemoral  bypass  ABI/TBIToday's ABIToday's TBIPrevious ABIPrevious TBI  +-------+-----------+-----------+------------+------------+  Right 0.82       0.66       0.95        0.62          +-------+-----------+-----------+------------+------------+  Left  0.83       0.64       0.95        0.76          +-------+-----------+-----------+------------+------------+     ASSESSMENT/PLAN:: 68 y.o. female here for follow up for surveillance of left axillobifemoral bypass graft  Bilateral lower extremities are well-perfused with palpable DP pulses; she has a strong pulse in her femorofemoral bypass graft.  On duplex axillofemoral and femorofemoral bypasses are widely patent.  There is an elevated velocity at the distal anastomosis of the axillofemoral bypass however given the strong femoral-femoral bypass graft pulse as well as palpable DP pulses, we will continue to monitor this with ultrasound.  She will continue to walk is much as possible.  She will also continue her aspirin  and statin daily.  We will repeat surveillance imaging in 6 months.  She knows to call/return office sooner with any questions or concerns.   Donnice Sender, PA-C [facility] [phone]  Clinic MD:   [place]

## 2023-03-24 ENCOUNTER — Other Ambulatory Visit: Payer: Self-pay

## 2023-03-24 ENCOUNTER — Other Ambulatory Visit: Payer: Self-pay | Admitting: Internal Medicine

## 2023-03-24 DIAGNOSIS — Z1231 Encounter for screening mammogram for malignant neoplasm of breast: Secondary | ICD-10-CM

## 2023-03-24 DIAGNOSIS — I739 Peripheral vascular disease, unspecified: Secondary | ICD-10-CM

## 2023-03-24 MED ORDER — NYSTATIN 100000 UNIT/ML MT SUSP
5.0000 mL | Freq: Four times a day (QID) | OROMUCOSAL | 0 refills | Status: DC
  Filled 2023-03-24: qty 100, 5d supply, fill #0

## 2023-03-25 ENCOUNTER — Ambulatory Visit (INDEPENDENT_AMBULATORY_CARE_PROVIDER_SITE_OTHER): Payer: 59 | Admitting: Physician Assistant

## 2023-03-25 ENCOUNTER — Ambulatory Visit: Payer: 59

## 2023-03-25 ENCOUNTER — Other Ambulatory Visit: Payer: Self-pay

## 2023-03-25 ENCOUNTER — Telehealth: Payer: Self-pay

## 2023-03-25 VITALS — BP 124/79 | HR 60 | Temp 98.4°F | Resp 20 | Ht 65.0 in | Wt 201.3 lb

## 2023-03-25 DIAGNOSIS — I739 Peripheral vascular disease, unspecified: Secondary | ICD-10-CM

## 2023-03-25 DIAGNOSIS — T82858S Stenosis of vascular prosthetic devices, implants and grafts, sequela: Secondary | ICD-10-CM

## 2023-03-25 DIAGNOSIS — I7409 Other arterial embolism and thrombosis of abdominal aorta: Secondary | ICD-10-CM

## 2023-03-25 MED ORDER — OXYCODONE HCL 5 MG PO TABS
5.0000 mg | ORAL_TABLET | Freq: Four times a day (QID) | ORAL | 0 refills | Status: DC | PRN
  Filled 2023-03-25: qty 10, 3d supply, fill #0

## 2023-03-25 NOTE — Telephone Encounter (Signed)
Triage Call:    Concern: Pain - excruciating   Location: started on right side then moved to left leg down to ankle    Description: gradual beginning yesterday afternoon and worsening overnight   Procedure:  Axillofemoral and femorofemoral bypass 01/2022  Consulted: PA   Resolution: Appointment scheduled today

## 2023-03-25 NOTE — Progress Notes (Signed)
Office Note   History of Present Illness   Brandy Stevenson is a 68 y.o. (Aug 17, 1955) female who presents as a triage visit.  She has a history of left axillobifemoral bypass on 02/13/2022 by Dr. Edilia Bo.  This was performed for rest pain and claudication symptoms.   She returns today as a triage visit.  She was seen at our office 2 weeks ago with no return of rest pain or claudication.  She was staying active as well.   She states Monday night she started having some aching pain around her right upper thigh.  When she woke up yesterday morning her right thigh pain was gone.  Unfortunately she started developing pain in her left leg, beginning at the left lateral hip and traveling down the leg to the ankle.  This pain has been fairly constant since yesterday.  She feels slight relief when standing, walking, or sitting upright.  Her pain is worse when she lays down and extends into her foot.  She denies any sensory or motor deficits in her feet.  She denies any cramping in her legs when she walks.  She denies any excessive coldness in her feet.  Overall she feels this pain is similar to the pain she felt prior to her bypass.   Current Outpatient Medications  Medication Sig Dispense Refill   acetaminophen (TYLENOL) 500 MG tablet Take 1,000 mg by mouth every 8 (eight) hours.     albuterol (PROAIR HFA) 108 (90 Base) MCG/ACT inhaler Inhale 2 puffs into the lungs every 6 (six) hours as needed for wheezing or shortness of breath. 6.7 g 3   albuterol (PROAIR HFA) 108 (90 Base) MCG/ACT inhaler Inhale 2 puffs into the lungs every 6 (six) hours as needed for wheezing or shortness of breath. 6.7 g 2   amLODipine (NORVASC) 5 MG tablet Take 1 tablet (5 mg total) by mouth daily. 90 tablet 1   aspirin EC 81 MG tablet Take 1 tablet (81 mg total) by mouth daily. 90 tablet 3   atorvastatin (LIPITOR) 80 MG tablet TAKE 1 TABLET BY MOUTH AT  BEDTIME 100 tablet 2   carvedilol (COREG) 6.25 MG tablet Take 1  tablet (6.25 mg total) by mouth 2 (two) times daily. 180 tablet 1   diclofenac Sodium (VOLTAREN) 1 % GEL Apply 4 g topically 4 (four) times daily. 100 g 1   fluticasone-salmeterol (ADVAIR) 250-50 MCG/ACT AEPB Inhale 1 puff into the lungs in the morning and at bedtime. 120 each 2   gabapentin (NEURONTIN) 300 MG capsule TAKE 1 CAPSULE BY MOUTH AT  BEDTIME 100 capsule 2   hydrALAZINE (APRESOLINE) 25 MG tablet Take 1 tablet (25 mg total) by mouth 3 (three) times daily. 270 tablet 1   meloxicam (MOBIC) 15 MG tablet TAKE 1 TABLET BY MOUTH DAILY 90 tablet 0   Menthol, Topical Analgesic, (BIOFREEZE) 5 % PTCH Apply 0.5 patches topically daily as needed (pain).     nystatin (MYCOSTATIN) 100000 UNIT/ML suspension SWISH AND SWALLOW 5 MLS (500,000 UNITS TOTAL) BY MOUTH 4 (FOUR) TIMES DAILY. 100 mL 0   omeprazole (PRILOSEC) 40 MG capsule TAKE 1 CAPSULE BY MOUTH DAILY 100 capsule 2   potassium chloride (KLOR-CON) 10 MEQ tablet Take 2 tablets (20 mEq total) by mouth daily. 180 tablet 1   spironolactone (ALDACTONE) 25 MG tablet Take 1 tablet (25 mg total) by mouth daily. 90 tablet 3   tiotropium (SPIRIVA HANDIHALER) 18 MCG inhalation capsule Place 1 capsule (18 mcg total)  into inhaler and inhale daily. 30 capsule 12   Vitamin D, Ergocalciferol, (DRISDOL) 1.25 MG (50000 UNIT) CAPS capsule Take 1 capsule (50,000 Units total) by mouth every 7 (seven) days. 12 capsule 2   No current facility-administered medications for this visit.    REVIEW OF SYSTEMS (negative unless checked):   Cardiac:  []  Chest pain or chest pressure? []  Shortness of breath upon activity? []  Shortness of breath when lying flat? []  Irregular heart rhythm?  Vascular:  []  Pain in calf, thigh, or hip brought on by walking? []  Pain in feet at night that wakes you up from your sleep? []  Blood clot in your veins? []  Leg swelling?  Pulmonary:  []  Oxygen at home? []  Productive cough? []  Wheezing?  Neurologic:  []  Sudden weakness in arms  or legs? []  Sudden numbness in arms or legs? []  Sudden onset of difficult speaking or slurred speech? []  Temporary loss of vision in one eye? []  Problems with dizziness?  Gastrointestinal:  []  Blood in stool? []  Vomited blood?  Genitourinary:  []  Burning when urinating? []  Blood in urine?  Psychiatric:  []  Major depression  Hematologic:  []  Bleeding problems? []  Problems with blood clotting?  Dermatologic:  []  Rashes or ulcers?  Constitutional:  []  Fever or chills?  Ear/Nose/Throat:  []  Change in hearing? []  Nose bleeds? []  Sore throat?  Musculoskeletal:  []  Back pain? []  Joint pain? []  Muscle pain?   Physical Examination   Vitals:   03/25/23 1324  BP: 124/79  Pulse: 60  Resp: 20  Temp: 98.4 F (36.9 C)  TempSrc: Temporal  SpO2: 96%  Weight: 201 lb 4.8 oz (91.3 kg)  Height: 5\' 5"  (1.651 m)   Body mass index is 33.5 kg/m.  General:  WDWN in NAD; vital signs documented above Gait: Not observed HENT: WNL, normocephalic Pulmonary: normal non-labored breathing , without rales, rhonchi,  wheezing Cardiac: regular Abdomen: soft, NT, no masses Skin: without rashes Vascular Exam/Pulses: Very faintly palpable femoral pulses bilaterally.  I cannot palpate a pulse in the femorofemoral bypass graft.  2+ DP pulses bilaterally Extremities: without ischemic changes, without gangrene.  Intact motor and sensation of both feet, no temperature differences Musculoskeletal: no muscle wasting or atrophy  Neurologic: A&O X 3;  No focal weakness or paresthesias are detected Psychiatric:  The pt has Normal affect.   Medical Decision Making   Brandy Stevenson is a 68 y.o. female who presents as a triage visit  The patient has a history of left axillobifemoral bypass grafting in December 2023 for rest pain and claudication.  Her symptoms were resolved after her bypass.  She was last seen at our office 2 weeks ago with no return of symptoms.  At that time she had a  focal area of stenosis at the distal portion of her axillary-femoral bypass around 70%, which we planned to monitor She states 2 days ago she developed sudden onset pain in her right groin.  This pain went away yesterday and then she started developing aching pain in her left leg.  This extends from her left hip down to her ankle.  This pain is worse when laying flat in bed.  If she lays down for a while her left foot also begins to hurt. On exam she has palpable DP pulses bilaterally.  She has faintly palpable femoral pulses bilaterally.  I cannot palpate a pulse in her femorofemoral bypass, however there is a monophasic signal present by Doppler.  2 weeks ago she had  a strong pulse in the femorofemoral bypass graft Based on the patient's symptoms, there is concern for worsening stenosis within her left axillobifemoral bypass graft.  Potentially this is causing her left lower extremity pain.  There is no evidence at this time that the graft is occluded, given that there is a signal within the graft.  She also does have palpable pedal pulses. Other options for the patient's pain includes neurological or musculoskeletal pain.  I have discussed this case with Dr. Randie Heinz.  He agrees that it is important to rule out bypass graft stenosis for her pain.  We will order a CTA chest/abdomen/pelvis with lower extremity runoff.  The patient can follow-up with Dr. Randie Heinz in a few weeks to discuss her results.  I have sent in a small prescription of oxycodone for pain control  Loel Dubonnet PA-C [facility] Office: [phone]  Clinic MD: Randie Heinz

## 2023-03-26 ENCOUNTER — Encounter (HOSPITAL_COMMUNITY): Payer: 59

## 2023-03-26 ENCOUNTER — Ambulatory Visit: Payer: 59

## 2023-04-01 ENCOUNTER — Ambulatory Visit: Payer: 59

## 2023-04-01 ENCOUNTER — Encounter (HOSPITAL_COMMUNITY): Payer: 59

## 2023-04-03 ENCOUNTER — Ambulatory Visit (HOSPITAL_COMMUNITY)
Admission: RE | Admit: 2023-04-03 | Discharge: 2023-04-03 | Disposition: A | Discharge status: Home / Self Care | Payer: 59 | Source: Ambulatory Visit | Attending: Vascular Surgery | Admitting: Vascular Surgery

## 2023-04-03 DIAGNOSIS — I739 Peripheral vascular disease, unspecified: Secondary | ICD-10-CM | POA: Insufficient documentation

## 2023-04-03 MED ORDER — SODIUM CHLORIDE (PF) 0.9 % IJ SOLN
INTRAMUSCULAR | Status: AC
Start: 2023-04-03 — End: ?
  Filled 2023-04-03: qty 50

## 2023-04-03 MED ORDER — IOHEXOL 350 MG/ML SOLN
100.0000 mL | Freq: Once | INTRAVENOUS | Status: AC | PRN
  Administered 2023-04-03: 100 mL via INTRAVENOUS

## 2023-04-08 ENCOUNTER — Ambulatory Visit (INDEPENDENT_AMBULATORY_CARE_PROVIDER_SITE_OTHER): Payer: 59 | Admitting: Vascular Surgery

## 2023-04-08 ENCOUNTER — Encounter: Payer: Self-pay | Admitting: Vascular Surgery

## 2023-04-08 ENCOUNTER — Other Ambulatory Visit: Payer: Self-pay

## 2023-04-08 VITALS — BP 147/86 | HR 65 | Temp 97.9°F | Ht 65.0 in | Wt 206.0 lb

## 2023-04-08 DIAGNOSIS — Z48812 Encounter for surgical aftercare following surgery on the circulatory system: Secondary | ICD-10-CM | POA: Diagnosis not present

## 2023-04-08 DIAGNOSIS — I739 Peripheral vascular disease, unspecified: Secondary | ICD-10-CM | POA: Diagnosis not present

## 2023-04-08 NOTE — Progress Notes (Signed)
 Patient ID: Brandy Stevenson, female   DOB: [birth year], 68 y.o.   MRN: [id number]  Reason for Consult: Follow-up   Referred by Brandy Barnie NOVAK, MD  Subjective:     HPI:  Brandy Stevenson is a 68 y.o. female  with history of left axillary to bifemoral bypass for rest pain.  At her last visit here she was having pain in the left hip radiating down the left leg.  This is subsequently resolved.  She is walking without limitation today denies any rest pain or tissue loss.  She is a former smoker she continues on aspirin  and a statin.  Past Medical History:  Diagnosis Date   Arthritis    back problems to be evaluated by neurology next month   Cancer [facility]) 2009   L, Breast, radiation and surgery.   COPD (chronic obstructive pulmonary disease) (HCC)    Depression    GERD (gastroesophageal reflux disease)    Hyperlipemia    Hypertension    Leriche syndrome (HCC)    Infrarenal aortic occlusion extending to bilateral internal iliac arteries.  External iliacs fill via collaterals from lumbar arteries to hypogastric/internal iliac arteries -minimal disease on the right side and no disease on the left side leg arteries distally.   Peripheral arterial disease (HCC)    Peripheral vascular disease (HCC) 2013   Pre-diabetes    Family History  Problem Relation Age of Onset   Hyperlipidemia Mother    Hypertension Mother    Heart disease Mother        Atrial Fib.   Hyperlipidemia Sister    Hypertension Sister    Heart disease Sister        Before age 56,  CHF   Heart attack Brother    Hyperlipidemia Brother    Hypertension Brother    Heart disease Brother    Hypertension Daughter    Past Surgical History:  Procedure Laterality Date   APPENDECTOMY  1980   AXILLARY-FEMORAL BYPASS GRAFT Bilateral 10/29/2021   Procedure: LEFT AXILLOBIFEMORAL BYPASS GRAFT;  Surgeon: Brandy Lonni RAMAN, MD;  Location: [facility];  Service: Vascular;  Laterality: Bilateral;   BREAST LUMPECTOMY  Left    s/p radiation therapy    BREAST SURGERY     COLONOSCOPY  07/2015   COLONOSCOPY WITH PROPOFOL  N/A 06/26/2015   Procedure: COLONOSCOPY WITH PROPOFOL ;  Surgeon: Brandy MARLA Vicci, MD;  Location: [facility];  Service: Endoscopy;  Laterality: N/A;   CT CTA CORONARY W/CA SCORE W/CM &/OR WO/CM  12/10/2017   Coronary calcium  score is 0.  No evidence of CAD.   PERIPHERAL VASCULAR CATHETERIZATION N/A 10/22/2015   Procedure: Abdominal Aortogram w/Lower Extremity;  Surgeon: Lonni Stevenson Eliza, MD;  Location: [facility] INVASIVE CV LAB: Aortic arch widely patent as far as innominate artery, right subclavian artery, right vertebral artery and right, common carotid.  Bilateral single renal arteries - Nonocclusive disease. Infrarenal aorta 100% - bilateral common Iliac 100% --> B Ex Iliacs fill via Int Iliac. Leg OK   TRANSTHORACIC ECHOCARDIOGRAM  08/2017   EF 60-65%. No RWMA.  Normal DF.  Normal valves.    TUBAL LIGATION  1991    Short Social History:  Social History   Tobacco Use   Smoking status: Former    Current packs/day: 0.00    Average packs/day: 0.3 packs/day for 40.0 years (10.0 ttl pk-yrs)    Types: Cigarettes    Start date: 02/08/1977    Quit date: 02/08/2017    Years since quitting:  6.1    Passive exposure: Never   Smokeless tobacco: Never  Substance Use Topics   Alcohol use: No    Allergies  Allergen Reactions   Vicodin [Hydrocodone -Acetaminophen ] Nausea And Vomiting and Other (See Comments)    Sweating   Bactrim  [Sulfamethoxazole -Trimethoprim ] Nausea And Vomiting    HA    Incruse Ellipta  [Umeclidinium Bromide ] Other (See Comments)    Dry mouth   Tramadol  Nausea And Vomiting   Wellbutrin  [Bupropion ] Other (See Comments)    Made me feel bad and sleepy    Current Outpatient Medications  Medication Sig Dispense Refill   acetaminophen  (TYLENOL ) 500 MG tablet Take 1,000 mg by mouth every 8 (eight) hours.     albuterol  (PROAIR  HFA) 108 (90 Base) MCG/ACT inhaler Inhale 2 puffs  into the lungs every 6 (six) hours as needed for wheezing or shortness of breath. 6.7 g 2   amLODipine  (NORVASC ) 5 MG tablet Take 1 tablet (5 mg total) by mouth daily. 90 tablet 1   aspirin  EC 81 MG tablet Take 1 tablet (81 mg total) by mouth daily. 90 tablet 3   atorvastatin  (LIPITOR ) 80 MG tablet TAKE 1 TABLET BY MOUTH AT  BEDTIME 100 tablet 2   carvedilol  (COREG ) 6.25 MG tablet Take 1 tablet (6.25 mg total) by mouth 2 (two) times daily. 180 tablet 1   diclofenac  Sodium (VOLTAREN ) 1 % GEL Apply 4 g topically 4 (four) times daily. 100 g 1   fluticasone -salmeterol (ADVAIR ) 250-50 MCG/ACT AEPB Inhale 1 puff into the lungs in the morning and at bedtime. 120 each 2   gabapentin  (NEURONTIN ) 300 MG capsule TAKE 1 CAPSULE BY MOUTH AT  BEDTIME 100 capsule 2   hydrALAZINE  (APRESOLINE ) 25 MG tablet Take 1 tablet (25 mg total) by mouth 3 (three) times daily. 270 tablet 1   meloxicam  (MOBIC ) 15 MG tablet TAKE 1 TABLET BY MOUTH DAILY 90 tablet 0   Menthol, Topical Analgesic, (BIOFREEZE) 5 % PTCH Apply 0.5 patches topically daily as needed (pain).     nystatin  (MYCOSTATIN ) 100000 UNIT/ML suspension SWISH AND SWALLOW 5 MLS (500,000 UNITS TOTAL) BY MOUTH 4 (FOUR) TIMES DAILY. 100 mL 0   omeprazole  (PRILOSEC) 40 MG capsule TAKE 1 CAPSULE BY MOUTH DAILY 100 capsule 2   oxyCODONE  (ROXICODONE ) 5 MG immediate release tablet Take 1 tablet (5 mg total) by mouth every 6 (six) hours as needed for severe pain (pain score 7-10). 10 tablet 0   potassium chloride  (KLOR-CON ) 10 MEQ tablet Take 2 tablets (20 mEq total) by mouth daily. 180 tablet 1   spironolactone  (ALDACTONE ) 25 MG tablet Take 1 tablet (25 mg total) by mouth daily. 90 tablet 3   tiotropium (SPIRIVA  HANDIHALER) 18 MCG inhalation capsule Place 1 capsule (18 mcg total) into inhaler and inhale daily. 30 capsule 12   Vitamin D , Ergocalciferol , (DRISDOL ) 1.25 MG (50000 UNIT) CAPS capsule Take 1 capsule (50,000 Units total) by mouth every 7 (seven) days. 12 capsule 2    No current facility-administered medications for this visit.    Review of Systems  Constitutional:  Constitutional negative. HENT: HENT negative.  Eyes: Eyes negative.  Respiratory: Respiratory negative.  Cardiovascular: Cardiovascular negative.  GI: Gastrointestinal negative.  Musculoskeletal: Positive for leg pain.  Skin: Skin negative.  Neurological: Neurological negative. Hematologic: Hematologic/lymphatic negative.  Psychiatric: Psychiatric negative.        Objective:  Objective   Vitals:   04/08/23 1010  BP: (!) 147/86  Pulse: 65  Temp: 97.9 F (36.6 C)  SpO2:  96%  Weight: 206 lb (93.4 kg)  Height: 5' 5 (1.651 m)   Body mass index is 34.28 kg/m.  Physical Exam HENT:     Head: Normocephalic.     Nose: Nose normal.  Eyes:     Pupils: Pupils are equal, round, and reactive to light.  Cardiovascular:     Pulses:          Femoral pulses are 1+ on the right side and 1+ on the left side.    Comments: Pulse exam limited by patient's habitus and scar tissue Pulmonary:     Effort: Pulmonary effort is normal.  Abdominal:     General: Abdomen is flat.  Musculoskeletal:        General: Normal range of motion.     Right lower leg: No edema.     Left lower leg: No edema.  Skin:    General: Skin is warm.     Capillary Refill: Capillary refill takes less than 2 seconds.  Neurological:     General: No focal deficit present.     Mental Status: She is alert.  Psychiatric:        Mood and Affect: Mood normal.     Data: CTA IMPRESSION: VASCULAR   1. Aorto iliac occlusive disease with widely patent left ax fem and left to right fem-fem bypass grafts. No evidence of ingraft stenosis or anastomotic complication. 2. No significant femoropopliteal disease in either lower extremity. 3. On the right, the popliteal artery continues as the anterior tibial artery due to chronic occlusion of the tibioperoneal trunk. Both the peroneal and posterior tibial arteries  reconstitute distally. 4. On the left there is patent 3 vessel runoff to the ankle. 5. Mild to moderate stenoses of the origin of the celiac axis and right renal artery. 6. High-grade stenosis of the origin of the IMA.   NON-VASCULAR   1. No acute abnormality within the abdomen or pelvis. 2. Mild paraseptal pulmonary emphysema. 3. Hepatic steatosis. 4. Cholelithiasis. 5. Focal L5-S1 degenerative disc disease.   ABI Findings:  +---------+------------------+-----+--------+--------+  Right   Rt Pressure (mmHg)IndexWaveformComment   +---------+------------------+-----+--------+--------+  Brachial 154                                      +---------+------------------+-----+--------+--------+  PTA     126               0.82 biphasic          +---------+------------------+-----+--------+--------+  DP      120               0.78 biphasic          +---------+------------------+-----+--------+--------+  Great Toe101               0.66                   +---------+------------------+-----+--------+--------+   +---------+------------------+-----+--------+-------+  Left    Lt Pressure (mmHg)IndexWaveformComment  +---------+------------------+-----+--------+-------+  Brachial 154                                     +---------+------------------+-----+--------+-------+  PTA     128               0.83 biphasic         +---------+------------------+-----+--------+-------+  DP  122               0.79 biphasic         +---------+------------------+-----+--------+-------+  Great Toe98                0.64                  +---------+------------------+-----+--------+-------+   +-------+-----------+-----------+------------+------------+  ABI/TBIToday's ABIToday's TBIPrevious ABIPrevious TBI  +-------+-----------+-----------+------------+------------+  Right 0.82       0.66       0.95        0.62           +-------+-----------+-----------+------------+------------+  Left  0.83       0.64       0.95        0.76          +-------+-----------+-----------+------------+------------+         Bilateral ABIs appear decreased compared to prior study on 09/18/2022.    Summary:  Right: Resting right ankle-brachial index indicates mild right lower  extremity arterial disease. The right toe-brachial index is abnormal.   Left: Resting left ankle-brachial index indicates mild left lower  extremity arterial disease. The left toe-brachial index is abnormal.       Assessment/Plan:     68 year old female with history as above of left axillary to bifemoral bypass for rest pain.  Currently she does not have rest pain or any lower extremity symptoms and hip pain from previous has resolved.  Her feet appear warm and well-perfused she continues on aspirin  and statin she will follow-up in 1 year with repeat duplex and ABI.     Penne Lonni [place] MD [facility]

## 2023-04-09 ENCOUNTER — Other Ambulatory Visit: Payer: Self-pay

## 2023-04-09 ENCOUNTER — Ambulatory Visit
Admission: RE | Admit: 2023-04-09 | Discharge: 2023-04-09 | Disposition: A | Discharge status: Home / Self Care | Payer: 59 | Source: Ambulatory Visit | Attending: Internal Medicine | Admitting: Internal Medicine

## 2023-04-09 DIAGNOSIS — Z1231 Encounter for screening mammogram for malignant neoplasm of breast: Secondary | ICD-10-CM

## 2023-04-10 ENCOUNTER — Ambulatory Visit (HOSPITAL_COMMUNITY): Payer: 59

## 2023-04-15 ENCOUNTER — Other Ambulatory Visit: Payer: Self-pay | Admitting: Internal Medicine

## 2023-04-15 ENCOUNTER — Encounter: Payer: Self-pay | Admitting: Internal Medicine

## 2023-04-15 DIAGNOSIS — R928 Other abnormal and inconclusive findings on diagnostic imaging of breast: Secondary | ICD-10-CM

## 2023-04-23 ENCOUNTER — Other Ambulatory Visit: Payer: Self-pay

## 2023-04-24 ENCOUNTER — Other Ambulatory Visit: Payer: Self-pay

## 2023-04-25 ENCOUNTER — Other Ambulatory Visit: Payer: Self-pay | Admitting: Internal Medicine

## 2023-04-25 DIAGNOSIS — Z760 Encounter for issue of repeat prescription: Secondary | ICD-10-CM

## 2023-04-27 NOTE — Telephone Encounter (Signed)
 Requested Prescriptions  Pending Prescriptions Disp Refills   meloxicam (MOBIC) 15 MG tablet [Pharmacy Med Name: Meloxicam 15 MG Oral Tablet] 90 tablet 0    Sig: TAKE 1 TABLET BY MOUTH DAILY     Analgesics:  COX2 Inhibitors Failed - 04/27/2023  1:22 PM      Failed - Manual Review: Labs are only required if the patient has taken medication for more than 8 weeks.      Failed - ALT in normal range and within 360 days    ALT  Date Value Ref Range Status  12/25/2022 46 (H) 0 - 32 IU/L Final         Passed - HGB in normal range and within 360 days    Hemoglobin  Date Value Ref Range Status  12/25/2022 11.1 11.1 - 15.9 g/dL Final   HGB  Date Value Ref Range Status  06/12/2008 13.8 11.6 - 15.9 g/dL Final         Passed - Cr in normal range and within 360 days    Creat  Date Value Ref Range Status  04/17/2016 0.77 0.50 - 1.05 mg/dL Final    Comment:      For patients > or = 67 years of age: The upper reference limit for Creatinine is approximately 13% higher for people identified as African-American.      Creatinine, Ser  Date Value Ref Range Status  12/25/2022 0.79 0.57 - 1.00 mg/dL Final         Passed - HCT in normal range and within 360 days    HCT  Date Value Ref Range Status  06/12/2008 40.2 34.8 - 46.6 % Final   Hematocrit  Date Value Ref Range Status  12/25/2022 35.3 34.0 - 46.6 % Final         Passed - AST in normal range and within 360 days    AST  Date Value Ref Range Status  12/25/2022 33 0 - 40 IU/L Final         Passed - eGFR is 30 or above and within 360 days    GFR, Est African American  Date Value Ref Range Status  04/17/2016 >89 >=60 mL/min Final   GFR calc Af Amer  Date Value Ref Range Status  04/28/2019 93 >59 mL/min/1.73 Final   GFR, Est Non African American  Date Value Ref Range Status  04/17/2016 85 >=60 mL/min Final   GFR, Estimated  Date Value Ref Range Status  10/30/2021 >60 >60 mL/min Final    Comment:    (NOTE) Calculated  using the CKD-EPI Creatinine Equation (2021)    eGFR  Date Value Ref Range Status  12/25/2022 82 >59 mL/min/1.73 Final         Passed - Patient is not pregnant      Passed - Valid encounter within last 12 months    Recent Outpatient Visits           4 months ago Essential hypertension   [facility] [place] Marcine Matar, MD   8 months ago Essential hypertension   [facility] Marcine Matar, MD   1 year ago Essential hypertension   [facility] Of . [facility] Marcine Matar, MD   1 year ago Rib pain on left side   [facility] [place] Hoy Register, MD   1 year ago Essential hypertension   [facility] Of [place]. [facility] Drucilla Chalet, RPH-CPP

## 2023-04-28 ENCOUNTER — Other Ambulatory Visit: Payer: Self-pay | Admitting: Internal Medicine

## 2023-04-28 ENCOUNTER — Ambulatory Visit
Admission: RE | Admit: 2023-04-28 | Discharge: 2023-04-28 | Disposition: A | Discharge status: Home / Self Care | Payer: 59 | Source: Ambulatory Visit | Attending: Internal Medicine | Admitting: Internal Medicine

## 2023-04-28 DIAGNOSIS — R928 Other abnormal and inconclusive findings on diagnostic imaging of breast: Secondary | ICD-10-CM

## 2023-04-29 ENCOUNTER — Encounter: Payer: Self-pay | Admitting: Internal Medicine

## 2023-05-02 ENCOUNTER — Other Ambulatory Visit: Payer: Self-pay | Admitting: Internal Medicine

## 2023-05-02 DIAGNOSIS — I70219 Atherosclerosis of native arteries of extremities with intermittent claudication, unspecified extremity: Secondary | ICD-10-CM

## 2023-05-05 ENCOUNTER — Other Ambulatory Visit: Payer: Self-pay | Admitting: Internal Medicine

## 2023-05-05 DIAGNOSIS — J449 Chronic obstructive pulmonary disease, unspecified: Secondary | ICD-10-CM

## 2023-05-06 ENCOUNTER — Telehealth: Payer: Self-pay | Admitting: Internal Medicine

## 2023-05-06 NOTE — Telephone Encounter (Signed)
 I received order form from [employer] on this pt for request of special bra given she had lumpectomy in past.  Please inquire whether she requested this. Also she is over due for f/u appt for chronic ds management.  Please give appt. Thanks.

## 2023-05-06 NOTE — Telephone Encounter (Signed)
 Called & spoke to the patient. Verified name & DOB. Patient stated that she does use [employer] for her special made bras. Additionally, a follow-up visit has been scheduled for 06/16/2023. Patient confirmed appointment.

## 2023-05-11 ENCOUNTER — Other Ambulatory Visit: Payer: Self-pay | Admitting: Internal Medicine

## 2023-05-11 DIAGNOSIS — J449 Chronic obstructive pulmonary disease, unspecified: Secondary | ICD-10-CM

## 2023-05-12 ENCOUNTER — Ambulatory Visit
Admission: RE | Admit: 2023-05-12 | Discharge: 2023-05-12 | Disposition: A | Discharge status: Home / Self Care | Payer: 59 | Source: Ambulatory Visit | Attending: Internal Medicine | Admitting: Internal Medicine

## 2023-05-12 ENCOUNTER — Ambulatory Visit
Admission: RE | Admit: 2023-05-12 | Discharge: 2023-05-12 | Disposition: A | Discharge status: Home / Self Care | Source: Ambulatory Visit | Attending: Internal Medicine | Admitting: Internal Medicine

## 2023-05-12 ENCOUNTER — Other Ambulatory Visit: Payer: Self-pay

## 2023-05-12 DIAGNOSIS — R928 Other abnormal and inconclusive findings on diagnostic imaging of breast: Secondary | ICD-10-CM

## 2023-05-12 DIAGNOSIS — N6321 Unspecified lump in the left breast, upper outer quadrant: Secondary | ICD-10-CM

## 2023-05-12 HISTORY — PX: BREAST BIOPSY: SHX20

## 2023-05-13 LAB — SURGICAL PATHOLOGY

## 2023-05-14 ENCOUNTER — Telehealth: Payer: Self-pay | Admitting: *Deleted

## 2023-05-14 NOTE — Telephone Encounter (Signed)
 Spoke to patient to confirm upcoming morning [facility] clinic appointment on 3/19, paperwork will be sent via mail.   Gave location and time, also informed patient that the surgeon's office would be calling as well to get information from them similar to the packet that they will be receiving so make sure to do both.  Reminded patient that all providers will be coming to the clinic to see them HERE and if they had any questions to not hesitate to reach back out to myself or their navigators.

## 2023-05-15 ENCOUNTER — Other Ambulatory Visit: Payer: Self-pay

## 2023-05-18 ENCOUNTER — Encounter: Payer: Self-pay | Admitting: *Deleted

## 2023-05-18 DIAGNOSIS — D0512 Intraductal carcinoma in situ of left breast: Secondary | ICD-10-CM | POA: Insufficient documentation

## 2023-05-19 NOTE — Progress Notes (Unsigned)
 [facility] CONSULT NOTE  Patient Care Team: Marcine Matar, MD as PCP - General (Internal Medicine) Marykay Lex, MD as PCP - Cardiology (Cardiology) Pershing Proud, RN as Oncology Nurse Navigator Donnelly Angelica, RN as Oncology Nurse Navigator Harriette Bouillon, MD as Consulting Physician (General Surgery) Rachel Moulds, MD as Consulting Physician (Hematology and Oncology) Antony Blackbird, MD as Consulting Physician (Radiation Oncology)  CHIEF COMPLAINTS/PURPOSE OF CONSULTATION:  Newly diagnosed breast cancer  HISTORY OF PRESENTING ILLNESS:  Brandy Stevenson 68 y.o. female is here because of recent diagnosis of left breast cancer  I reviewed her records extensively and collaborated the history with the patient.  SUMMARY OF ONCOLOGIC HISTORY: Oncology History  Ductal carcinoma in situ (DCIS) of left breast  04/09/2023 Mammogram   In the left breast, a possible mass warrants further evaluation. In the right breast, no findings suspicious for malignancy. Suspicious 8 mm mass in the left breast at 2 o'clock, 5 cm the nipple. Tissue sampling is indicated. Single left axillary lymph node, deep, difficult to visualize and near the axillary artery, with eccentric cortical thickening.   05/18/2023 Initial Diagnosis   Ductal carcinoma in situ (DCIS) of left breast   05/20/2023 Pathology Results   . Breast, left, needle core biopsy, 2 o'clock, 5cmfn, coil clip :       DUCTAL CARCINOMA IN SITU, HIGH NUCLEAR GRADE       NECROSIS: NOT IDENTIFIED       CALCIFICATIONS: NOT IDENTIFIED       DCIS LENGTH: 0.6 CM        Estrogen Receptor:  100%, POSITIVE, STRONG STAINING INTENSITY  Progesterone Receptor:  0%, NEGATIVE  COMMENT:  The negative hormone receptor study(ies) in this case has an internal positive control     Discussed the use of AI scribe software for clinical note transcription with the patient, who gave verbal consent to proceed.  History of Present  Illness    Brandy Stevenson is a 68 year old female with ductal carcinoma in situ (DCIS) who presents for oncology consultation regarding breast cancer treatment options.  Initially diagnosed with ductal carcinoma in situ (DCIS) in 2009, she underwent a lumpectomy and radiation therapy. Hormone therapy was offered but declined due to her smoking habit, as she was advised against taking tamoxifen while smoking due to the increased risk of blood clots.  Recently, she has been informed that a mastectomy is necessary because it is challenging to radiate the breast again. She is experiencing emotional difficulty with the prospect of losing her breast. The cancer remains noninvasive, but there is a small chance it could be invasive upon removal.  She has a history of smoking, with a maximum of six cigarettes per day, and has since quit. She has not undergone genetic testing for BRCA1 or BRCA2 genes and does not recall if it was done in 2009. She has three children and no family history of cancer.  In August 2023, she underwent a vascular bypass surgery due to poor circulation in her legs. She has a history of hypertension but no diabetes or heart disease. She has not taken hormones or birth control for an extended period.   MEDICAL HISTORY:  Past Medical History:  Diagnosis Date   Arthritis    back problems to be evaluated by neurology next month   Cancer [facility]) 2009   L, Breast, radiation and surgery.   COPD (chronic obstructive pulmonary disease) (HCC)    Depression  GERD (gastroesophageal reflux disease)    Hyperlipemia    Hypertension    Leriche syndrome (HCC)    Infrarenal aortic occlusion extending to bilateral internal iliac arteries.  External iliacs fill via collaterals from lumbar arteries to hypogastric/internal iliac arteries -minimal disease on the right side and no disease on the left side leg arteries distally.   Peripheral arterial disease (HCC)    Peripheral vascular  disease (HCC) 2013   Pre-diabetes     SURGICAL HISTORY: Past Surgical History:  Procedure Laterality Date   APPENDECTOMY  1980   AXILLARY-FEMORAL BYPASS GRAFT Bilateral 10/29/2021   Procedure: LEFT AXILLOBIFEMORAL BYPASS GRAFT;  Surgeon: Chuck Hint, MD;  Location: [facility] OR;  Service: Vascular;  Laterality: Bilateral;   BREAST BIOPSY Left 05/12/2023   [place] LT BREAST BX W LOC DEV 1ST LESION IMG BX SPEC US GUIDE 05/12/2023 [facility] MAMMOGRAPHY   BREAST LUMPECTOMY Left    s/p radiation therapy    BREAST SURGERY     COLONOSCOPY  07/2015   COLONOSCOPY WITH PROPOFOL N/A 06/26/2015   Procedure: COLONOSCOPY WITH PROPOFOL;  Surgeon: Charolett Bumpers, MD;  Location: [facility] END[facility];  Service: Endoscopy;  Laterality: N/A;   CT CTA CORONARY W/CA SCORE W/CM &/OR WO/CM  12/10/2017   Coronary calcium score is 0.  No evidence of CAD.   PERIPHERAL VASCULAR CATHETERIZATION N/A 10/22/2015   Procedure: Abdominal Aortogram w/Lower Extremity;  Surgeon: Chuck Hint, MD;  Location: [facility] INVASIVE CV LAB: Aortic arch widely patent as far as innominate artery, right subclavian artery, right vertebral artery and right, common carotid.  Bilateral single renal arteries - Nonocclusive disease. Infrarenal aorta 100% - bilateral common Iliac 100% --> B Ex Iliacs fill via Int Iliac. Leg OK   TRANSTHORACIC ECHOCARDIOGRAM  08/2017   EF 60-65%. No RWMA.  Normal DF.  Normal valves.    TUBAL LIGATION  1991    SOCIAL HISTORY: Social History   Socioeconomic History   Marital status: Married    Spouse name: Not on file   Number of children: Not on file   Years of education: Not on file   Highest education level: 12th grade  Occupational History   Not on file  Tobacco Use   Smoking status: Former    Current packs/day: 0.00    Average packs/day: 0.3 packs/day for 40.0 years (10.0 ttl pk-yrs)    Types: Cigarettes    Start date: 02/08/1977    Quit date: 02/08/2017    Years since quitting: 6.2    Passive exposure:  Never   Smokeless tobacco: Never  Vaping Use   Vaping status: Never Used  Substance and Sexual Activity   Alcohol use: No   Drug use: No   Sexual activity: Yes  Other Topics Concern   Not on file  Social History Narrative   Not on file   Social Drivers of Health   Financial Resource Strain: Low Risk  (10/21/2022)   Overall Financial Resource Strain (CARDIA)    Difficulty of Paying Living Expenses: Not hard at all  Food Insecurity: No Food Insecurity (12/25/2022)   Hunger Vital Sign    Worried About Running Out of Food in the Last Year: Never true    Ran Out of Food in the Last Year: Never true  Transportation Needs: No Transportation Needs (12/25/2022)   PRAPARE - Administrator, Civil Service (Medical): No    Lack of Transportation (Non-Medical): No  Physical Activity: Inactive (12/25/2022)   Exercise Vital Sign  Days of Exercise per Week: 0 days    Minutes of Exercise per Session: 30 min  Stress: No Stress Concern Present (12/25/2022)   [employer] of Occupational Health - Occupational Stress Questionnaire    Feeling of Stress : Only a little  Social Connections: Socially Integrated (12/25/2022)   Social Connection and Isolation Panel [NHANES]    Frequency of Communication with Friends and Family: More than three times a week    Frequency of Social Gatherings with Friends and Family: Twice a week    Attends Religious Services: More than 4 times per year    Active Member of [employer] or Organizations: Yes    Attends Banker Meetings: 1 to 4 times per year    Marital Status: Married  Catering manager Violence: Not At Risk (10/21/2022)   Humiliation, Afraid, Rape, and Kick questionnaire    Fear of Current or Ex-Partner: No    Emotionally Abused: No    Physically Abused: No    Sexually Abused: No    FAMILY HISTORY: Family History  Problem Relation Age of Onset   Hyperlipidemia Mother    Hypertension Mother    Heart disease Mother         Atrial Fib.   Hyperlipidemia Sister    Hypertension Sister    Heart disease Sister        Before age 54,  CHF   Hypertension Daughter    Breast cancer Cousin 72   Heart attack Brother    Hyperlipidemia Brother    Hypertension Brother    Heart disease Brother    BRCA 1/2 Neg Hx     ALLERGIES:  is allergic to vicodin [hydrocodone-acetaminophen], bactrim [sulfamethoxazole-trimethoprim], incruse ellipta [umeclidinium bromide], tramadol, and wellbutrin [bupropion].  MEDICATIONS:  Current Outpatient Medications  Medication Sig Dispense Refill   acetaminophen (TYLENOL) 500 MG tablet Take 1,000 mg by mouth every 8 (eight) hours.     albuterol (PROAIR HFA) 108 (90 Base) MCG/ACT inhaler Inhale 2 puffs into the lungs every 6 (six) hours as needed for wheezing or shortness of breath. 6.7 g 2   amLODipine (NORVASC) 5 MG tablet Take 1 tablet (5 mg total) by mouth daily. 90 tablet 1   aspirin EC 81 MG tablet Take 1 tablet (81 mg total) by mouth daily. 90 tablet 3   atorvastatin (LIPITOR) 80 MG tablet TAKE 1 TABLET BY MOUTH AT  BEDTIME 100 tablet 2   carvedilol (COREG) 6.25 MG tablet Take 1 tablet (6.25 mg total) by mouth 2 (two) times daily. 180 tablet 1   diclofenac Sodium (VOLTAREN) 1 % GEL Apply 4 g topically 4 (four) times daily. 100 g 1   fluticasone-salmeterol (ADVAIR) 250-50 MCG/ACT AEPB Inhale 1 puff into the lungs in the morning and at bedtime. 120 each 2   gabapentin (NEURONTIN) 300 MG capsule TAKE 1 CAPSULE BY MOUTH AT  BEDTIME 100 capsule 2   hydrALAZINE (APRESOLINE) 25 MG tablet Take 1 tablet (25 mg total) by mouth 3 (three) times daily. 270 tablet 1   meloxicam (MOBIC) 15 MG tablet TAKE 1 TABLET BY MOUTH DAILY 90 tablet 0   Menthol, Topical Analgesic, (BIOFREEZE) 5 % PTCH Apply 0.5 patches topically daily as needed (pain).     nystatin (MYCOSTATIN) 100000 UNIT/ML suspension SWISH AND SWALLOW 5 MLS (500,000 UNITS TOTAL) BY MOUTH 4 (FOUR) TIMES DAILY. 100 mL 0   omeprazole (PRILOSEC) 40  MG capsule TAKE 1 CAPSULE BY MOUTH DAILY 100 capsule 2   oxyCODONE (ROXICODONE) 5 MG  immediate release tablet Take 1 tablet (5 mg total) by mouth every 6 (six) hours as needed for severe pain (pain score 7-10). 10 tablet 0   potassium chloride (KLOR-CON) 10 MEQ tablet Take 2 tablets (20 mEq total) by mouth daily. 180 tablet 1   spironolactone (ALDACTONE) 25 MG tablet Take 1 tablet (25 mg total) by mouth daily. 90 tablet 3   tiotropium (SPIRIVA HANDIHALER) 18 MCG inhalation capsule Place 1 capsule (18 mcg total) into inhaler and inhale daily. 30 capsule 12   Vitamin D, Ergocalciferol, (DRISDOL) 1.25 MG (50000 UNIT) CAPS capsule Take 1 capsule (50,000 Units total) by mouth every 7 (seven) days. 12 capsule 2   No current facility-administered medications for this visit.    REVIEW OF SYSTEMS:   Constitutional: Denies fevers, chills or abnormal night sweats Eyes: Denies blurriness of vision, double vision or watery eyes Ears, nose, mouth, throat, and face: Denies mucositis or sore throat Respiratory: Denies cough, dyspnea or wheezes Cardiovascular: Denies palpitation, chest discomfort or lower extremity swelling Gastrointestinal:  Denies nausea, heartburn or change in bowel habits Skin: Denies abnormal skin rashes Lymphatics: Denies new lymphadenopathy or easy bruising Neurological:Denies numbness, tingling or new weaknesses Behavioral/Psych: Mood is stable, no new changes  Breast: Denies any palpable lumps or discharge All other systems were reviewed with the patient and are negative.  PHYSICAL EXAMINATION: ECOG PERFORMANCE STATUS: 0 - Asymptomatic  Vitals:   05/20/23 0909  BP: (!) 144/76  Pulse: 63  Resp: 17  Temp: (!) 97.5 F (36.4 C)  SpO2: 100%   Filed Weights   05/20/23 0909  Weight: 202 lb 8 oz (91.9 kg)    GENERAL:alert, no distress and comfortable SKIN: skin color, texture, turgor are normal, no rashes or significant lesions EYES: normal, conjunctiva are pink and  non-injected, sclera clear OROPHARYNX:no exudate, no erythema and lips, buccal mucosa, and tongue normal  NECK: supple, thyroid normal size, non-tender, without nodularity LYMPH:  no palpable lymphadenopathy in the cervical, axillary LUNGS: clear to auscultation and percussion with normal breathing effort HEART: regular rate & rhythm and no murmurs and no lower extremity edema ABDOMEN:abdomen soft, non-tender and normal bowel sounds Musculoskeletal:no cyanosis of digits and no clubbing  PSYCH: alert & oriented x 3 with fluent speech NEURO: no focal motor/sensory deficits BREAST: Left breast post treatment changes. Large pendulous breasts. No palpable masses No regional adenopathy  LABORATORY DATA:  I have reviewed the data as listed Lab Results  Component Value Date   WBC 4.6 12/25/2022   HGB 11.1 12/25/2022   HCT 35.3 12/25/2022   MCV 98 (H) 12/25/2022   PLT 256 12/25/2022   Lab Results  Component Value Date   NA 144 12/25/2022   K 4.2 12/25/2022   CL 104 12/25/2022   CO2 26 12/25/2022    RADIOGRAPHIC STUDIES: I have personally reviewed the radiological reports and agreed with the findings in the report.  ASSESSMENT AND PLAN:  Ductal carcinoma in situ (DCIS) of left breast Ductal Carcinoma In Situ (DCIS) Recurrent noninvasive breast cancer, ER positive, PR negative.  Mastectomy discussed by surgery team, she is understandably upset about this. Anti-estrogen therapy post-surgery to reduce cancer risk. Genetic testing for BRCA1/2 advised for hereditary risk assessment. - Initiate anti-estrogen therapy post-surgery with a daily pill for five years. We have discussed options for antiestrogen therapy today. With regards to Tamoxifen, we discussed that this is a SERM, selective estrogen receptor modulator. We discussed mechanism of action of Tamoxifen, adverse effects on Tamoxifen including but  not limited to post menopausal symptoms, increased risk of DVT/PE, increased risk of  endometrial cancer, questionable cataracts with long term use and increased risk of cardiovascular events in the study which was not statistically significant. A benefit from Tamoxifen would be improvement in bone density. With regards to aromatase inhibitors, we discussed mechanism of action, adverse effects including but not limited to post menopausal symptoms, arthralgias, myalgias, increased risk of cardiovascular events and bone loss.  She is agreeable to anti estrogen therapy. We will consider aromatase inhibitors in the adjuvant setting. - Monitor bone density during hormone therapy.  Peripheral Vascular Disease Vascular bypass surgery performed in August 2023 for poor leg circulation.  Hypertension Hypertension noted.  Smoking history Light smoking history with cessation achieved, beneficial for health and hormone therapy risk reduction.  Recommended genetic testing since this is here second breast cancer.  Rachel Moulds MD   All questions were answered. The patient knows to call the clinic with any problems, questions or concerns.    Rachel Moulds, MD 05/20/23

## 2023-05-20 ENCOUNTER — Ambulatory Visit
Admission: RE | Admit: 2023-05-20 | Discharge: 2023-05-20 | Disposition: A | Discharge status: Home / Self Care | Source: Ambulatory Visit | Attending: Radiation Oncology | Admitting: Radiation Oncology

## 2023-05-20 ENCOUNTER — Encounter: Payer: Self-pay | Admitting: Genetic Counselor

## 2023-05-20 ENCOUNTER — Inpatient Hospital Stay: Admitting: Genetic Counselor

## 2023-05-20 ENCOUNTER — Encounter: Payer: Self-pay | Admitting: *Deleted

## 2023-05-20 ENCOUNTER — Inpatient Hospital Stay

## 2023-05-20 ENCOUNTER — Ambulatory Visit: Payer: Self-pay | Admitting: Surgery

## 2023-05-20 ENCOUNTER — Inpatient Hospital Stay: Attending: Hematology and Oncology | Admitting: Hematology and Oncology

## 2023-05-20 ENCOUNTER — Inpatient Hospital Stay: Admitting: Licensed Clinical Social Worker

## 2023-05-20 ENCOUNTER — Ambulatory Visit: Admitting: Physical Therapy

## 2023-05-20 VITALS — BP 144/76 | HR 63 | Temp 97.5°F | Resp 17 | Wt 202.5 lb

## 2023-05-20 DIAGNOSIS — D0512 Intraductal carcinoma in situ of left breast: Secondary | ICD-10-CM

## 2023-05-20 DIAGNOSIS — Z860101 Personal history of adenomatous and serrated colon polyps: Secondary | ICD-10-CM

## 2023-05-20 DIAGNOSIS — I739 Peripheral vascular disease, unspecified: Secondary | ICD-10-CM | POA: Diagnosis not present

## 2023-05-20 DIAGNOSIS — Z803 Family history of malignant neoplasm of breast: Secondary | ICD-10-CM | POA: Diagnosis not present

## 2023-05-20 DIAGNOSIS — Z170 Estrogen receptor positive status [ER+]: Secondary | ICD-10-CM | POA: Insufficient documentation

## 2023-05-20 DIAGNOSIS — Z8042 Family history of malignant neoplasm of prostate: Secondary | ICD-10-CM

## 2023-05-20 DIAGNOSIS — Z79899 Other long term (current) drug therapy: Secondary | ICD-10-CM | POA: Diagnosis not present

## 2023-05-20 DIAGNOSIS — Z1722 Progesterone receptor negative status: Secondary | ICD-10-CM | POA: Insufficient documentation

## 2023-05-20 DIAGNOSIS — Z87891 Personal history of nicotine dependence: Secondary | ICD-10-CM | POA: Insufficient documentation

## 2023-05-20 DIAGNOSIS — I10 Essential (primary) hypertension: Secondary | ICD-10-CM | POA: Diagnosis not present

## 2023-05-20 DIAGNOSIS — Z853 Personal history of malignant neoplasm of breast: Secondary | ICD-10-CM

## 2023-05-20 LAB — CBC WITH DIFFERENTIAL (CANCER CENTER ONLY)
Abs Immature Granulocytes: 0.01 10*3/uL (ref 0.00–0.07)
Basophils Absolute: 0.1 10*3/uL (ref 0.0–0.1)
Basophils Relative: 1 %
Eosinophils Absolute: 0.1 10*3/uL (ref 0.0–0.5)
Eosinophils Relative: 1 %
HCT: 38 % (ref 36.0–46.0)
Hemoglobin: 12.4 g/dL (ref 12.0–15.0)
Immature Granulocytes: 0 %
Lymphocytes Relative: 45 %
Lymphs Abs: 1.9 10*3/uL (ref 0.7–4.0)
MCH: 32.2 pg (ref 26.0–34.0)
MCHC: 32.6 g/dL (ref 30.0–36.0)
MCV: 98.7 fL (ref 80.0–100.0)
Monocytes Absolute: 0.7 10*3/uL (ref 0.1–1.0)
Monocytes Relative: 17 %
Neutro Abs: 1.5 10*3/uL — ABNORMAL LOW (ref 1.7–7.7)
Neutrophils Relative %: 36 %
Platelet Count: 254 10*3/uL (ref 150–400)
RBC: 3.85 MIL/uL — ABNORMAL LOW (ref 3.87–5.11)
RDW: 13.2 % (ref 11.5–15.5)
WBC Count: 4.2 10*3/uL (ref 4.0–10.5)
nRBC: 0 % (ref 0.0–0.2)

## 2023-05-20 LAB — CMP (CANCER CENTER ONLY)
ALT: 33 U/L (ref 0–44)
AST: 22 U/L (ref 15–41)
Albumin: 4.5 g/dL (ref 3.5–5.0)
Alkaline Phosphatase: 78 U/L (ref 38–126)
Anion gap: 3 — ABNORMAL LOW (ref 5–15)
BUN: 13 mg/dL (ref 8–23)
CO2: 30 mmol/L (ref 22–32)
Calcium: 9.8 mg/dL (ref 8.9–10.3)
Chloride: 106 mmol/L (ref 98–111)
Creatinine: 0.79 mg/dL (ref 0.44–1.00)
GFR, Estimated: >60 mL/min (ref >60)
Glucose, Bld: 99 mg/dL (ref 70–99)
Potassium: 4.5 mmol/L (ref 3.5–5.1)
Sodium: 139 mmol/L (ref 135–145)
Total Bilirubin: 0.5 mg/dL (ref 0.0–1.2)
Total Protein: 7.9 g/dL (ref 6.5–8.1)

## 2023-05-20 NOTE — Progress Notes (Signed)
 REFERRING PROVIDER: Rachel Moulds, MD [address] [place],  [place] [address]  PRIMARY PROVIDER:  Marcine Matar, MD  PRIMARY REASON FOR VISIT:  1. Ductal carcinoma in situ (DCIS) of left breast   2. Personal history of breast cancer   3. Personal history of adenomatous and serrated colon polyps   4. Family history of breast cancer   5. Family history of prostate cancer     HISTORY OF PRESENT ILLNESS:   Brandy Stevenson, a 68 y.o. female, was seen for a [facility] cancer genetics consultation at the request of Dr. Al Pimple due to a personal history of breast cancer.  Brandy Stevenson presents to clinic today to discuss the possibility of a hereditary predisposition to cancer, to discuss genetic testing, and to further clarify her future cancer risks, as well as potential cancer risks for family members.   In March 2025, at the age of 89, Brandy Stevenson was diagnosed with ductal carcinoma in situ (DCIS) of the left breast (ER+/PR-). The treatment plan is pending.  She also has a history of left DCIS at the age of 17 as well as personal history of more than 10 lifetime tubular adenomas of the colon.   CANCER HISTORY:  Oncology History  Ductal carcinoma in situ (DCIS) of left breast  04/09/2023 Mammogram   In the left breast, a possible mass warrants further evaluation. In the right breast, no findings suspicious for malignancy. Suspicious 8 mm mass in the left breast at 2 o'clock, 5 cm the nipple. Tissue sampling is indicated. Single left axillary lymph node, deep, difficult to visualize and near the axillary artery, with eccentric cortical thickening.   05/18/2023 Initial Diagnosis   Ductal carcinoma in situ (DCIS) of left breast   05/20/2023 Pathology Results   . Breast, left, needle core biopsy, 2 o'clock, 5cmfn, coil clip :       DUCTAL CARCINOMA IN SITU, HIGH NUCLEAR GRADE       NECROSIS: NOT IDENTIFIED       CALCIFICATIONS: NOT IDENTIFIED       DCIS LENGTH: 0.6 CM        Estrogen Receptor:   100%, POSITIVE, STRONG STAINING INTENSITY  Progesterone Receptor:  0%, NEGATIVE  COMMENT:  The negative hormone receptor study(ies) in this case has an internal positive control        Past Medical History:  Diagnosis Date   Arthritis    back problems to be evaluated by neurology next month   Cancer (HCC) 2009   L, Breast, radiation and surgery.   COPD (chronic obstructive pulmonary disease) (HCC)    Depression    GERD (gastroesophageal reflux disease)    Hyperlipemia    Hypertension    Leriche syndrome (HCC)    Infrarenal aortic occlusion extending to bilateral internal iliac arteries.  External iliacs fill via collaterals from lumbar arteries to hypogastric/internal iliac arteries -minimal disease on the right side and no disease on the left side leg arteries distally.   Peripheral arterial disease (HCC)    Peripheral vascular disease (HCC) 2013   Pre-diabetes     Past Surgical History:  Procedure Laterality Date   APPENDECTOMY  1980   AXILLARY-FEMORAL BYPASS GRAFT Bilateral 10/29/2021   Procedure: LEFT AXILLOBIFEMORAL BYPASS GRAFT;  Surgeon: Chuck Hint, MD;  Location: [facility] OR;  Service: Vascular;  Laterality: Bilateral;   BREAST BIOPSY Left 05/12/2023   [place] LT BREAST BX W LOC DEV 1ST LESION IMG BX SPEC US GUIDE 05/12/2023 [facility] MAMMOGRAPHY  BREAST LUMPECTOMY Left    s/p radiation therapy    BREAST SURGERY     COLONOSCOPY  07/2015   COLONOSCOPY WITH PROPOFOL N/A 06/26/2015   Procedure: COLONOSCOPY WITH PROPOFOL;  Surgeon: Charolett Bumpers, MD;  Location: [facility];  Service: Endoscopy;  Laterality: N/A;   CT CTA CORONARY W/CA SCORE W/CM &/OR WO/CM  12/10/2017   Coronary calcium score is 0.  No evidence of CAD.   PERIPHERAL VASCULAR CATHETERIZATION N/A 10/22/2015   Procedure: Abdominal Aortogram w/Lower Extremity;  Surgeon: Chuck Hint, MD;  Location: [facility] INVASIVE CV LAB: Aortic arch widely patent as far as innominate artery, right subclavian artery, right  vertebral artery and right, common carotid.  Bilateral single renal arteries - Nonocclusive disease. Infrarenal aorta 100% - bilateral common Iliac 100% --> B Ex Iliacs fill via Int Iliac. Leg OK   TRANSTHORACIC ECHOCARDIOGRAM  08/2017   EF 60-65%. No RWMA.  Normal DF.  Normal valves.    TUBAL LIGATION  1991    FAMILY HISTORY:  We obtained a detailed, 4-generation family history.  Significant diagnoses are listed below: Family History  Problem Relation Age of Onset   Cancer Maternal Uncle        unknown type; mets   Gastric cancer Maternal Uncle    Prostate cancer Maternal Uncle        mets?   Gastric cancer Paternal Grandfather        dx >50   Breast cancer Cousin 12       maternal female cousin   Breast cancer Cousin        paternal female cousin; dx <50    Brandy Stevenson is unaware of previous family history of genetic testing for hereditary cancer risks. There is no reported Ashkenazi Jewish ancestry. There is no known consanguinity.  GENETIC COUNSELING ASSESSMENT: Brandy Stevenson is a 68 y.o. female with a personal and family history of cancer which is somewhat suggestive of a hereditary cancer syndrome and predisposition to cancer given the presence of breast cancer before age 11 in multiple relatives.  Her personal history of more than 10 lifetime tubular adenomas is also somewhat suggestive of a hereditary polyposis condition. We, therefore, discussed and recommended the following at today's visit.   DISCUSSION: We discussed that 5 - 10% of cancer is hereditary.  Most cases of hereditary breast cancer are associated with mutations in BRCA1/2.  There are other genes that can be associated with hereditary breast, prostate, gastric, or other cancer syndromes.  There are genes, including but not limited to APC and MUTYH, that are associated with hereditary polyposis syndromes. We discussed that testing is beneficial for several reasons including knowing how to follow individuals for their  cancer risks, identifying whether potential treatment/surgery would be beneficial, and understanding if other family members could be at an increased risk for cancer and allowing them to undergo genetic testing.   We reviewed the characteristics, features and inheritance patterns of hereditary cancer syndromes. We also discussed genetic testing, including the appropriate family members to test, the process of testing, insurance coverage and turn-around-time for results. We discussed the implications of a negative, positive, carrier and/or variant of uncertain significant result. We recommended Brandy Stevenson pursue genetic testing for a panel that includes genes associated with breast cancer, gastric cancer, prostate cancer, other cancers, as well as colon polyps.  Based on Brandy Stevenson's personal history of breast cancer at age 43, she meets NCCN medical criteria for genetic testing.  PLAN:  Ms.  Stevenson did not wish to pursue genetic testing at today's visit. She is feeling overwhelmed by information today, but says she may consider genetics in the future.  She says that genetics would not impact her surgery decisions at this time. We remain available to coordinate genetic testing at any time in the future. We, therefore, recommend Brandy Stevenson continue to follow the cancer screening guidelines given by her primary healthcare provider.  Relatives should discuss the family history of cancer with their providers.  Based on Brandy Stevenson's family history, we recommended her maternal and paternal cousins, who were diagnosed with pre-menopausal breast cancer, have genetic counseling and testing.  Brandy Stevenson questions were answered to her satisfaction today. Our contact information was provided should additional questions or concerns arise. Thank you for the referral and allowing [place] to share in the care of your patient.   Neera Teng M. Rennie Plowman, MS, [facility] Genetic Counselor [email or web address] (P)  [phone]   30 minutes were spent on the date of the encounter in service to the patient including preparation, face-to-face consultation, documentation and care coordination.  The patient was accompanied by her husband.  Dr. Al Pimple was available to discuss this case as needed.    _______________________________________________________________________ For Office Staff:  Number of people involved in session: 2 Was an Intern/ student involved with case: no

## 2023-05-20 NOTE — Assessment & Plan Note (Signed)
 Ductal Carcinoma In Situ (DCIS) Recurrent noninvasive breast cancer, ER positive, PR negative.  Mastectomy discussed by surgery team, she is understandably upset about this. Anti-estrogen therapy post-surgery to reduce cancer risk. Genetic testing for BRCA1/2 advised for hereditary risk assessment. - Initiate anti-estrogen therapy post-surgery with a daily pill for five years. We have discussed options for antiestrogen therapy today. With regards to Tamoxifen, we discussed that this is a SERM, selective estrogen receptor modulator. We discussed mechanism of action of Tamoxifen, adverse effects on Tamoxifen including but not limited to post menopausal symptoms, increased risk of DVT/PE, increased risk of endometrial cancer, questionable cataracts with long term use and increased risk of cardiovascular events in the study which was not statistically significant. A benefit from Tamoxifen would be improvement in bone density. With regards to aromatase inhibitors, we discussed mechanism of action, adverse effects including but not limited to post menopausal symptoms, arthralgias, myalgias, increased risk of cardiovascular events and bone loss.  She is agreeable to anti estrogen therapy. We will consider aromatase inhibitors in the adjuvant setting. - Monitor bone density during hormone therapy.  Peripheral Vascular Disease Vascular bypass surgery performed in August 2023 for poor leg circulation.  Hypertension Hypertension noted.  Smoking history Light smoking history with cessation achieved, beneficial for health and hormone therapy risk reduction.  Recommended genetic testing since this is here second breast cancer.  Rachel Moulds MD

## 2023-05-20 NOTE — Progress Notes (Signed)
 [facility] Clinical Social Work  Initial Assessment   Brandy Stevenson is a 68 y.o. year old female accompanied by husband. Clinical Social Work was referred by  [facility]  for assessment of psychosocial needs.   SDOH (Social Determinants of Health) assessments performed: Yes SDOH Interventions    Flowsheet Row Clinical Support from 05/20/2023 in [facility] - A Dept Of [place]. [facility] Clinical Support from 10/21/2022 in [facility] - A Dept Of Eligha Bridegroom. [facility]  SDOH Interventions    Food Insecurity Interventions Intervention Not Indicated Intervention Not Indicated  Housing Interventions Intervention Not Indicated Intervention Not Indicated  Transportation Interventions Intervention Not Indicated Intervention Not Indicated  Utilities Interventions Intervention Not Indicated Intervention Not Indicated  Alcohol Usage Interventions -- Intervention Not Indicated (Score <7)  Financial Strain Interventions -- Intervention Not Indicated  Physical Activity Interventions -- Intervention Not Indicated  Stress Interventions -- Intervention Not Indicated  Social Connections Interventions -- Intervention Not Indicated  Health Literacy Interventions -- Intervention Not Indicated       SDOH Screenings   Food Insecurity: No Food Insecurity (05/20/2023)  Housing: Low Risk  (05/20/2023)  Transportation Needs: No Transportation Needs (05/20/2023)  Utilities: Not At Risk (05/20/2023)  Alcohol Screen: Low Risk  (10/21/2022)  Depression (PHQ2-9): Low Risk  (10/21/2022)  Financial Resource Strain: Low Risk  (10/21/2022)  Physical Activity: Inactive (12/25/2022)  Social Connections: Socially Integrated (12/25/2022)  Stress: No Stress Concern Present (12/25/2022)  Tobacco Use: Medium Risk (05/20/2023)   Received from [facility]  Health Literacy: Adequate Health Literacy (10/21/2022)     Distress Screen completed: No     No data  to display            Family/Social Information:  Housing Arrangement: patient lives with husband, [place] members/support persons in your life? Family- mom, husband, 3 kids (42, 18, 54) and Friends Transportation concerns: no  Employment: Disabled.  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 yes, leaning towards lumpectomy and AI Patient reported stressors: Adjusting to my illness Patient enjoys time with family/ friends Current coping skills/ strengths: Religious Affiliation  and Supportive family/friends     SUMMARY: Current SDOH Barriers:  NO major barriers identified today  Clinical Social Work Clinical Goal(s):  No clinical social work goals at this time  Interventions: Discussed common feeling and emotions when being diagnosed with cancer, and the importance of support during treatment Informed patient of the support team roles and support services at [facility] Provided CSW contact information and encouraged patient to call with any questions or concerns   Follow Up Plan: Patient will contact CSW with any support or resource needs Patient verbalizes understanding of plan: Yes    Diamond Martucci E Melayah Skorupski, LCSW Clinical Social Worker [facility]

## 2023-05-20 NOTE — Progress Notes (Signed)
 Radiation Oncology         [phone] ________________________________  Multidisciplinary Breast Oncology Clinic [facility]) Initial Outpatient Consultation  Name: Brandy Stevenson MRN: [id number]  Date: 05/20/2023  DOB: 1955-07-14  FT:DDUKGUR, Binnie Rail, MD  Harriette Bouillon, MD   REFERRING PHYSICIAN: Harriette Bouillon, MD  DIAGNOSIS: The encounter diagnosis was Ductal carcinoma in situ (DCIS) of left breast.  Stage 0 (cTis (DCIS), cN0, cM0) Left Breast UIQ, High-grade DCIS , ER+ / PR- / Her2 not assessed  History of left breast DCIS diagnosed in 2009, s/p lumpectomy, radiation therapy, and antiestrogen therapy for 2.5 years    ICD-10-CM   1. Ductal carcinoma in situ (DCIS) of left breast  D05.12       HISTORY OF PRESENT ILLNESS::Brandy Stevenson is a 68 y.o. female who is presenting to the office today for evaluation of her newly diagnosed breast cancer. She is accompanied by her husband. She is doing well overall.   She had routine screening mammography on 04/09/23 showing a possible abnormality in the left breast. She underwent a left breast diagnostic mammography with tomography and left breast ultrasonography at [facility] on 04/28/23 showing: a suspicious mass in the 2 o'clock left breast measuring 8 mm in the greatest linear extent of the sample, located 5 cmfn. A single abnormal left axillary lymph was also demonstrated with eccentric cortical thickening.   Biopsy of the 2 o'clock left breast on 05/12/23 showed: high grade DCIS measuring 6 mm in the greatest linear extent of the sample. Prognostic indicators significant for: estrogen receptor, 100% positive with strong staining intensity and progesterone receptor, 0% negative. Her2 not assessed. No axillary lymph nodes were examined.   The patient was referred today for presentation in the multidisciplinary conference.  Radiology studies and pathology slides were presented there for review and discussion of  treatment options.  A consensus was discussed regarding potential next steps.  PREVIOUS RADIATION THERAPY: Yes   History of DCIS of the left breast diagnosed in 2009, s/p lumpectomy, radiation therapy completed in August of 2009, followed by tamoxifen for approximately 2.5 years.   PAST MEDICAL HISTORY:  Past Medical History:  Diagnosis Date   Arthritis    back problems to be evaluated by neurology next month   Cancer [facility]) 2009   L, Breast, radiation and surgery.   COPD (chronic obstructive pulmonary disease) (HCC)    Depression    GERD (gastroesophageal reflux disease)    Hyperlipemia    Hypertension    Leriche syndrome (HCC)    Infrarenal aortic occlusion extending to bilateral internal iliac arteries.  External iliacs fill via collaterals from lumbar arteries to hypogastric/internal iliac arteries -minimal disease on the right side and no disease on the left side leg arteries distally.   Peripheral arterial disease (HCC)    Peripheral vascular disease (HCC) 2013   Pre-diabetes     PAST SURGICAL HISTORY: Past Surgical History:  Procedure Laterality Date   APPENDECTOMY  1980   AXILLARY-FEMORAL BYPASS GRAFT Bilateral 10/29/2021   Procedure: LEFT AXILLOBIFEMORAL BYPASS GRAFT;  Surgeon: Chuck Hint, MD;  Location: [facility] OR;  Service: Vascular;  Laterality: Bilateral;   BREAST BIOPSY Left 05/12/2023   [place] LT BREAST BX W LOC DEV 1ST LESION IMG BX SPEC US GUIDE 05/12/2023 [facility] MAMMOGRAPHY   BREAST LUMPECTOMY Left    s/p radiation therapy    BREAST SURGERY     COLONOSCOPY  07/2015   COLONOSCOPY WITH PROPOFOL N/A 06/26/2015   Procedure: COLONOSCOPY WITH  PROPOFOL;  Surgeon: Charolett Bumpers, MD;  Location: [facility];  Service: Endoscopy;  Laterality: N/A;   CT CTA CORONARY W/CA SCORE W/CM &/OR WO/CM  12/10/2017   Coronary calcium score is 0.  No evidence of CAD.   PERIPHERAL VASCULAR CATHETERIZATION N/A 10/22/2015   Procedure: Abdominal Aortogram w/Lower Extremity;  Surgeon:  Chuck Hint, MD;  Location: [facility] INVASIVE CV LAB: Aortic arch widely patent as far as innominate artery, right subclavian artery, right vertebral artery and right, common carotid.  Bilateral single renal arteries - Nonocclusive disease. Infrarenal aorta 100% - bilateral common Iliac 100% --> B Ex Iliacs fill via Int Iliac. Leg OK   TRANSTHORACIC ECHOCARDIOGRAM  08/2017   EF 60-65%. No RWMA.  Normal DF.  Normal valves.    TUBAL LIGATION  1991    FAMILY HISTORY:  Family History  Problem Relation Age of Onset   Hyperlipidemia Mother    Hypertension Mother    Heart disease Mother        Atrial Fib.   Hyperlipidemia Sister    Hypertension Sister    Heart disease Sister        Before age 73,  CHF   Hypertension Daughter    Breast cancer Cousin 9   Heart attack Brother    Hyperlipidemia Brother    Hypertension Brother    Heart disease Brother    BRCA 1/2 Neg Hx     SOCIAL HISTORY:  Social History   Socioeconomic History   Marital status: Married    Spouse name: Not on file   Number of children: Not on file   Years of education: Not on file   Highest education level: 12th grade  Occupational History   Not on file  Tobacco Use   Smoking status: Former    Current packs/Stevenson: 0.00    Average packs/Stevenson: 0.3 packs/Stevenson for 40.0 years (10.0 ttl pk-yrs)    Types: Cigarettes    Start date: 02/08/1977    Quit date: 02/08/2017    Years since quitting: 6.2    Passive exposure: Never   Smokeless tobacco: Never  Vaping Use   Vaping status: Never Used  Substance and Sexual Activity   Alcohol use: No   Drug use: No   Sexual activity: Yes  Other Topics Concern   Not on file  Social History Narrative   Not on file   Social Drivers of Health   Financial Resource Strain: Low Risk  (10/21/2022)   Overall Financial Resource Strain (CARDIA)    Difficulty of Paying Living Expenses: Not hard at all  Food Insecurity: No Food Insecurity (05/20/2023)   Hunger Vital Sign    Worried  About Running Out of Food in the Last Year: Never true    Ran Out of Food in the Last Year: Never true  Transportation Needs: No Transportation Needs (05/20/2023)   PRAPARE - Administrator, Civil Service (Medical): No    Lack of Transportation (Non-Medical): No  Physical Activity: Inactive (12/25/2022)   Exercise Vital Sign    Days of Exercise per Week: 0 days    Minutes of Exercise per Session: 30 min  Stress: No Stress Concern Present (12/25/2022)   [employer] of Occupational Health - Occupational Stress Questionnaire    Feeling of Stress : Only a little  Social Connections: Socially Integrated (12/25/2022)   Social Connection and Isolation Panel [NHANES]    Frequency of Communication with Friends and Family: More than three times a week  Frequency of Social Gatherings with Friends and Family: Twice a week    Attends Religious Services: More than 4 times per year    Active Member of [employer] or Organizations: Yes    Attends Banker Meetings: 1 to 4 times per year    Marital Status: Married    ALLERGIES:  Allergies  Allergen Reactions   Vicodin [Hydrocodone-Acetaminophen] Nausea And Vomiting and Other (See Comments)    Sweating   Bactrim [Sulfamethoxazole-Trimethoprim] Nausea And Vomiting    HA    Incruse Ellipta [Umeclidinium Bromide] Other (See Comments)    Dry mouth   Tramadol Nausea And Vomiting   Wellbutrin [Bupropion] Other (See Comments)    Made me feel bad and sleepy    MEDICATIONS:  Current Outpatient Medications  Medication Sig Dispense Refill   acetaminophen (TYLENOL) 500 MG tablet Take 1,000 mg by mouth every 8 (eight) hours.     albuterol (PROAIR HFA) 108 (90 Base) MCG/ACT inhaler Inhale 2 puffs into the lungs every 6 (six) hours as needed for wheezing or shortness of breath. 6.7 g 2   amLODipine (NORVASC) 5 MG tablet Take 1 tablet (5 mg total) by mouth daily. 90 tablet 1   aspirin EC 81 MG tablet Take 1 tablet (81 mg total) by  mouth daily. 90 tablet 3   atorvastatin (LIPITOR) 80 MG tablet TAKE 1 TABLET BY MOUTH AT  BEDTIME 100 tablet 2   carvedilol (COREG) 6.25 MG tablet Take 1 tablet (6.25 mg total) by mouth 2 (two) times daily. 180 tablet 1   diclofenac Sodium (VOLTAREN) 1 % GEL Apply 4 g topically 4 (four) times daily. 100 g 1   fluticasone-salmeterol (ADVAIR) 250-50 MCG/ACT AEPB Inhale 1 puff into the lungs in the morning and at bedtime. 120 each 2   gabapentin (NEURONTIN) 300 MG capsule TAKE 1 CAPSULE BY MOUTH AT  BEDTIME 100 capsule 2   hydrALAZINE (APRESOLINE) 25 MG tablet Take 1 tablet (25 mg total) by mouth 3 (three) times daily. 270 tablet 1   meloxicam (MOBIC) 15 MG tablet TAKE 1 TABLET BY MOUTH DAILY 90 tablet 0   Menthol, Topical Analgesic, (BIOFREEZE) 5 % PTCH Apply 0.5 patches topically daily as needed (pain).     nystatin (MYCOSTATIN) 100000 UNIT/ML suspension SWISH AND SWALLOW 5 MLS (500,000 UNITS TOTAL) BY MOUTH 4 (FOUR) TIMES DAILY. 100 mL 0   omeprazole (PRILOSEC) 40 MG capsule TAKE 1 CAPSULE BY MOUTH DAILY 100 capsule 2   oxyCODONE (ROXICODONE) 5 MG immediate release tablet Take 1 tablet (5 mg total) by mouth every 6 (six) hours as needed for severe pain (pain score 7-10). 10 tablet 0   potassium chloride (KLOR-CON) 10 MEQ tablet Take 2 tablets (20 mEq total) by mouth daily. 180 tablet 1   spironolactone (ALDACTONE) 25 MG tablet Take 1 tablet (25 mg total) by mouth daily. 90 tablet 3   tiotropium (SPIRIVA HANDIHALER) 18 MCG inhalation capsule Place 1 capsule (18 mcg total) into inhaler and inhale daily. 30 capsule 12   Vitamin D, Ergocalciferol, (DRISDOL) 1.25 MG (50000 UNIT) CAPS capsule Take 1 capsule (50,000 Units total) by mouth every 7 (seven) days. 12 capsule 2   No current facility-administered medications for this encounter.    REVIEW OF SYSTEMS: A 10+ POINT REVIEW OF SYSTEMS WAS OBTAINED including neurology, dermatology, psychiatry, cardiac, respiratory, lymph, extremities, GI, GU,  musculoskeletal, constitutional, reproductive, HEENT.    PHYSICAL EXAM:     05/20/2023  Vitals with BMI   Height  Weight 202 lbs 8 oz   BMI   Systolic 144 !   Diastolic 76 !   Pulse 63       Lungs are clear to auscultation bilaterally. Heart has regular rate and rhythm. No palpable cervical, supraclavicular, or axillary adenopathy. Abdomen soft, non-tender, normal bowel sounds. Breast: Right breast extremely large and pendulous with no palpable mass, nipple discharge, or bleeding. Left breast is somewhat smaller than the right with hyperpigmentation changes present and diffuse induration consistent with radiation therapy. Lumpectomy scar is also present in the upper outer quadrant of the left breast as well as a biopsy site in the lateral aspect of the breast with steri strips in place.   KPS = 90  100 - Normal; no complaints; no evidence of disease. 90   - Able to carry on normal activity; minor signs or symptoms of disease. 80   - Normal activity with effort; some signs or symptoms of disease. 28   - Cares for self; unable to carry on normal activity or to do active work. 60   - Requires occasional assistance, but is able to care for most of his personal needs. 50   - Requires considerable assistance and frequent medical care. 40   - Disabled; requires special care and assistance. 30   - Severely disabled; hospital admission is indicated although death not imminent. 20   - Very sick; hospital admission necessary; active supportive treatment necessary. 10   - Moribund; fatal processes progressing rapidly. 0     - Dead  Karnofsky DA, Abelmann WH, Craver LS and Burchenal [facility] [phone]) The use of the nitrogen mustards in the palliative treatment of carcinoma: with particular reference to bronchogenic carcinoma Cancer 1 634-56  LABORATORY DATA:  Lab Results  Component Value Date   WBC 4.6 12/25/2022   HGB 11.1 12/25/2022   HCT 35.3 12/25/2022   MCV 98 (H) 12/25/2022   PLT 256  12/25/2022   Lab Results  Component Value Date   NA 144 12/25/2022   K 4.2 12/25/2022   CL 104 12/25/2022   CO2 26 12/25/2022   Lab Results  Component Value Date   ALT 46 (H) 12/25/2022   AST 33 12/25/2022   ALKPHOS 111 12/25/2022   BILITOT 0.3 12/25/2022    PULMONARY FUNCTION TEST:   Review Flowsheet        No data to display          RADIOGRAPHY: [place] LT BREAST BX W LOC DEV 1ST LESION IMG BX SPEC US GUIDE Addendum Date: 05/14/2023 ADDENDUM REPORT: 05/14/2023 09:34 ADDENDUM: Pathology revealed DUCTAL CARCINOMA IN SITU, HIGH NUCLEAR GRADE, NECROSIS: NOT IDENTIFIED, CALCIFICATIONS: NOT IDENTIFIED of the LEFT breast, 2 o'clock, 5cmfn, (coil clip). This was found to be concordant by Dr. Amie Portland. Pathology results were discussed with the patient by telephone. The patient reported doing well after the biopsy with minimal to no tenderness at the site. Post biopsy instructions and care were reviewed and questions were answered. The patient was encouraged to call [facility] for any additional concerns. My direct phone number was provided. The patient was referred to [facility] on May 20, 2023. Consideration for a bilateral breast MRI for further evaluation of extent of disease given the high grade histology. NOTE: Biopsy was NOT attempted of the single LEFT axillary lymph node with eccentric cortical thickening due to its position, deep, difficult to visualize and near  the axillary artery. Pathology results reported by Rene Kocher, RN on 05/14/2023. Electronically Signed   By: Amie Portland M.D.   On: 05/14/2023 09:34   Result Date: 05/14/2023 CLINICAL DATA:  Patient presents for ultrasound-guided core needle biopsy of a left breast mass. EXAM: ULTRASOUND GUIDED LEFT BREAST CORE NEEDLE BIOPSY COMPARISON:  Previous exam(s). PROCEDURE: I met with the patient and we discussed the  procedure of ultrasound-guided biopsy, including benefits and alternatives. We discussed the high likelihood of a successful procedure. We discussed the risks of the procedure, including infection, bleeding, tissue injury, clip migration, and inadequate sampling. Informed written consent was given. The usual time-out protocol was performed immediately prior to the procedure. Lesion quadrant: Upper outer quadrant Using sterile technique and 1% Lidocaine as local anesthetic, under direct ultrasound visualization, a 12 gauge spring-loaded device was used to perform biopsy of the 8 mm mass at 2 o'clock, 5 cm from the nipple, using an inferior approach. At the conclusion of the procedure a coil shaped tissue marker clip was deployed into the biopsy cavity. Follow up 2 view mammogram was performed and dictated separately. IMPRESSION: Ultrasound guided biopsy of a left breast mass. No apparent complications. Electronically Signed: By: Amie Portland M.D. On: 05/12/2023 08:22   MM CLIP PLACEMENT LEFT Result Date: 05/12/2023 CLINICAL DATA:  Assess post biopsy marker clip placement following ultrasound-guided core needle biopsy of a left breast mass. EXAM: 3D DIAGNOSTIC LEFT MAMMOGRAM POST ULTRASOUND BIOPSY COMPARISON:  Previous exam(s). ACR Breast Density Category b: There are scattered areas of fibroglandular density. FINDINGS: 3D Mammographic images were obtained following ultrasound guided biopsy of a left breast mass. The biopsy marking clip is in expected position at the site of biopsy. IMPRESSION: Appropriate positioning of the coil shaped biopsy marking clip at the site of biopsy in the within the mass in the upper outer left breast. Final Assessment: Post Procedure Mammograms for Marker Placement Electronically Signed   By: Amie Portland M.D.   On: 05/12/2023 08:32   MM 3D DIAGNOSTIC MAMMOGRAM UNILATERAL LEFT BREAST Result Date: 04/28/2023 CLINICAL DATA:  Recall from screening for a possible left breast mass.  Patient has a history of a left lumpectomy for breast carcinoma with adjuvant radiation therapy, lumpectomy performed in 2009. EXAM: DIGITAL DIAGNOSTIC UNILATERAL LEFT MAMMOGRAM WITH TOMOSYNTHESIS AND CAD; ULTRASOUND LEFT BREAST LIMITED TECHNIQUE: Left digital diagnostic mammography and breast tomosynthesis was performed. The images were evaluated with computer-aided detection. ; Targeted ultrasound examination of the left breast was performed. COMPARISON:  Previous exam(s). ACR Breast Density Category b: There are scattered areas of fibroglandular density. FINDINGS: On spot compression imaging, the possible mass noted in the left breast persists as a lobulated, partly circumscribed, 8 mm mass in the upper outer quadrant, anterior to the lumpectomy bed. On physical exam, the breast is firm throughout the upper outer quadrant. No discrete mass is palpated. Targeted ultrasound is performed, showing a small hypoechoic irregular mass in the left breast at 2 o'clock, 5 cm the nipple, measuring 8 x 6 x 7 mm. This is consistent in size and location to the mammographic mass. Sonographic detection of this mass was difficult due to the dense fibrotic tissue. Sonographic imaging of the left axilla demonstrates 1 deep lymph node with eccentric cortical thickening measuring 6 mm. This lies just superficial to the subclavian artery. Remaining lymph nodes are all normal. IMPRESSION: 1. Suspicious 8 mm mass in the left breast at 2 o'clock, 5 cm the nipple. Tissue sampling is indicated. 2. Single left  axillary lymph node, deep, difficult to visualize and near the axillary artery, with eccentric cortical thickening. RECOMMENDATION: 1. Ultrasound-guided core needle biopsy the 8 mm mass at 2 o'clock, 5 cm the nipple. Biopsy will not be attempted of the single axillary lymph node with eccentric cortical thickening due to its position. I have discussed the findings and recommendations with the patient. If applicable, a reminder letter  will be sent to the patient regarding the next appointment. BI-RADS CATEGORY  4: Suspicious. Electronically Signed   By: Amie Portland M.D.   On: 04/28/2023 13:54   [place] LIMITED ULTRASOUND INCLUDING AXILLA LEFT BREAST  Result Date: 04/28/2023 CLINICAL DATA:  Recall from screening for a possible left breast mass. Patient has a history of a left lumpectomy for breast carcinoma with adjuvant radiation therapy, lumpectomy performed in 2009. EXAM: DIGITAL DIAGNOSTIC UNILATERAL LEFT MAMMOGRAM WITH TOMOSYNTHESIS AND CAD; ULTRASOUND LEFT BREAST LIMITED TECHNIQUE: Left digital diagnostic mammography and breast tomosynthesis was performed. The images were evaluated with computer-aided detection. ; Targeted ultrasound examination of the left breast was performed. COMPARISON:  Previous exam(s). ACR Breast Density Category b: There are scattered areas of fibroglandular density. FINDINGS: On spot compression imaging, the possible mass noted in the left breast persists as a lobulated, partly circumscribed, 8 mm mass in the upper outer quadrant, anterior to the lumpectomy bed. On physical exam, the breast is firm throughout the upper outer quadrant. No discrete mass is palpated. Targeted ultrasound is performed, showing a small hypoechoic irregular mass in the left breast at 2 o'clock, 5 cm the nipple, measuring 8 x 6 x 7 mm. This is consistent in size and location to the mammographic mass. Sonographic detection of this mass was difficult due to the dense fibrotic tissue. Sonographic imaging of the left axilla demonstrates 1 deep lymph node with eccentric cortical thickening measuring 6 mm. This lies just superficial to the subclavian artery. Remaining lymph nodes are all normal. IMPRESSION: 1. Suspicious 8 mm mass in the left breast at 2 o'clock, 5 cm the nipple. Tissue sampling is indicated. 2. Single left axillary lymph node, deep, difficult to visualize and near the axillary artery, with eccentric cortical thickening.  RECOMMENDATION: 1. Ultrasound-guided core needle biopsy the 8 mm mass at 2 o'clock, 5 cm the nipple. Biopsy will not be attempted of the single axillary lymph node with eccentric cortical thickening due to its position. I have discussed the findings and recommendations with the patient. If applicable, a reminder letter will be sent to the patient regarding the next appointment. BI-RADS CATEGORY  4: Suspicious. Electronically Signed   By: Amie Portland M.D.   On: 04/28/2023 13:54      IMPRESSION: Stage 0 (cTis (DCIS), cN0, cM0) Left Breast UIQ, High-grade DCIS , ER+ / PR- / Her2 not assessed  In light of her previous radiation treatments, adjuvant radiation therapy is not recommended for her new breast cancer.   Options to consider would be mastectomy, or lumpectomy and adjuvant hormonal therapy. If she were to have a mastectomy she would have significant asymmetry issues in light of her extremely large and pendulous right breast  With her previous breast cancer she did not complete a course of  hormonal therapy but is now willing to proceed with this treatment as recommended.   After careful consideration, she would like to proceed with a lumpectomy rather than a mastectomy with the understanding that she will continue to have close follow up with imaging and exams.   PLAN:  Left breast lumpectomy  Aromatase inhibitor    ------------------------------------------------  Billie Lade, PhD, MD  This document serves as a record of services personally performed by Antony Blackbird, MD. It was created on his behalf by Neena Rhymes, a trained medical scribe. The creation of this record is based on the scribe's personal observations and the provider's statements to them. This document has been checked and approved by the attending provider.

## 2023-05-26 ENCOUNTER — Other Ambulatory Visit: Payer: Self-pay | Admitting: Internal Medicine

## 2023-05-26 ENCOUNTER — Other Ambulatory Visit: Payer: Self-pay | Admitting: Surgery

## 2023-05-26 DIAGNOSIS — D0512 Intraductal carcinoma in situ of left breast: Secondary | ICD-10-CM

## 2023-05-26 DIAGNOSIS — J449 Chronic obstructive pulmonary disease, unspecified: Secondary | ICD-10-CM

## 2023-05-27 ENCOUNTER — Other Ambulatory Visit: Payer: Self-pay

## 2023-05-28 ENCOUNTER — Other Ambulatory Visit: Payer: Self-pay | Admitting: Internal Medicine

## 2023-05-28 ENCOUNTER — Telehealth: Payer: Self-pay | Admitting: *Deleted

## 2023-05-28 ENCOUNTER — Encounter: Payer: Self-pay | Admitting: *Deleted

## 2023-05-28 ENCOUNTER — Other Ambulatory Visit: Payer: Self-pay

## 2023-05-28 DIAGNOSIS — J449 Chronic obstructive pulmonary disease, unspecified: Secondary | ICD-10-CM

## 2023-05-28 NOTE — Telephone Encounter (Signed)
 Left message for a return phone call to follow up from [facility] 3/19.

## 2023-06-01 ENCOUNTER — Other Ambulatory Visit: Payer: Self-pay

## 2023-06-04 ENCOUNTER — Other Ambulatory Visit: Payer: Self-pay

## 2023-06-05 ENCOUNTER — Other Ambulatory Visit: Payer: Self-pay

## 2023-06-05 ENCOUNTER — Encounter (HOSPITAL_BASED_OUTPATIENT_CLINIC_OR_DEPARTMENT_OTHER): Payer: Self-pay | Admitting: Surgery

## 2023-06-05 NOTE — Progress Notes (Signed)
   06/05/23 0946  PAT Phone Screen  Is the patient taking a GLP-1 receptor agonist? No  Do You Have Diabetes? No  Do You Have Hypertension? Yes  Have You Ever Been to the ER for Asthma? Yes  Have You Taken Oral Steroids in the Past 3 Months? No  Do you Take Phenteramine or any Other Diet Drugs? No  Recent  Lab Work, EKG, CXR? Yes  Where was this test performed? 05-20-23 had CBC/diff, CMP at [facility]  Do you have a history of heart problems? (S)  Yes (sees cards for HTN, athlerosclerosis)  Cardiologist Name Dr Herbie Baltimore  Have you ever had tests on your heart? Yes  What cardiac tests were performed? (S)  Echo;Other (comment) (08-2021 CT coronary-No CAD)  What date/year were cardiac tests completed? 2019 ECHO EF 60-65%  Results viewable: [facility] Media Tab  Any Recent Hospitalizations? No  Height 5\' 5"  (1.651 m)  Weight 92.1 kg  Pat Appointment Scheduled No  Reason for No Appointment Not Needed

## 2023-06-08 ENCOUNTER — Other Ambulatory Visit: Payer: Self-pay | Admitting: Internal Medicine

## 2023-06-08 ENCOUNTER — Other Ambulatory Visit: Payer: Self-pay

## 2023-06-08 DIAGNOSIS — J449 Chronic obstructive pulmonary disease, unspecified: Secondary | ICD-10-CM

## 2023-06-08 MED ORDER — ALBUTEROL SULFATE HFA 108 (90 BASE) MCG/ACT IN AERS
2.0000 | INHALATION_SPRAY | Freq: Four times a day (QID) | RESPIRATORY_TRACT | 2 refills | Status: DC | PRN
  Filled 2023-06-08: qty 6.7, 25d supply, fill #0
  Filled 2023-07-17: qty 6.7, 25d supply, fill #1
  Filled 2023-09-08: qty 6.7, 25d supply, fill #2

## 2023-06-09 ENCOUNTER — Ambulatory Visit
Admission: RE | Admit: 2023-06-09 | Discharge: 2023-06-09 | Disposition: A | Discharge status: Home / Self Care | Source: Ambulatory Visit | Attending: Surgery | Admitting: Surgery

## 2023-06-09 ENCOUNTER — Other Ambulatory Visit: Payer: Self-pay

## 2023-06-09 DIAGNOSIS — D0512 Intraductal carcinoma in situ of left breast: Secondary | ICD-10-CM

## 2023-06-09 HISTORY — PX: BREAST BIOPSY: SHX20

## 2023-06-10 NOTE — Progress Notes (Signed)

## 2023-06-11 ENCOUNTER — Encounter

## 2023-06-11 ENCOUNTER — Ambulatory Visit (HOSPITAL_BASED_OUTPATIENT_CLINIC_OR_DEPARTMENT_OTHER)
Admission: RE | Admit: 2023-06-11 | Discharge: 2023-06-11 | Disposition: A | Discharge status: Home / Self Care | Source: Ambulatory Visit | Attending: Surgery | Admitting: Surgery

## 2023-06-11 ENCOUNTER — Ambulatory Visit (HOSPITAL_BASED_OUTPATIENT_CLINIC_OR_DEPARTMENT_OTHER): Admitting: Anesthesiology

## 2023-06-11 ENCOUNTER — Other Ambulatory Visit: Payer: Self-pay

## 2023-06-11 ENCOUNTER — Encounter (HOSPITAL_BASED_OUTPATIENT_CLINIC_OR_DEPARTMENT_OTHER): Payer: Self-pay | Admitting: Surgery

## 2023-06-11 ENCOUNTER — Ambulatory Visit
Admission: RE | Admit: 2023-06-11 | Discharge: 2023-06-11 | Discharge status: Home / Self Care | Source: Ambulatory Visit | Attending: Surgery | Admitting: Surgery

## 2023-06-11 ENCOUNTER — Encounter (HOSPITAL_BASED_OUTPATIENT_CLINIC_OR_DEPARTMENT_OTHER)
Admission: RE | Disposition: A | Discharge status: Home / Self Care | Payer: Self-pay | Source: Ambulatory Visit | Attending: Surgery

## 2023-06-11 DIAGNOSIS — Z1722 Progesterone receptor negative status: Secondary | ICD-10-CM | POA: Insufficient documentation

## 2023-06-11 DIAGNOSIS — D0512 Intraductal carcinoma in situ of left breast: Secondary | ICD-10-CM | POA: Insufficient documentation

## 2023-06-11 DIAGNOSIS — Z87891 Personal history of nicotine dependence: Secondary | ICD-10-CM | POA: Diagnosis not present

## 2023-06-11 DIAGNOSIS — I10 Essential (primary) hypertension: Secondary | ICD-10-CM | POA: Insufficient documentation

## 2023-06-11 DIAGNOSIS — Z170 Estrogen receptor positive status [ER+]: Secondary | ICD-10-CM | POA: Diagnosis not present

## 2023-06-11 DIAGNOSIS — F418 Other specified anxiety disorders: Secondary | ICD-10-CM | POA: Diagnosis not present

## 2023-06-11 DIAGNOSIS — Z7951 Long term (current) use of inhaled steroids: Secondary | ICD-10-CM | POA: Diagnosis not present

## 2023-06-11 DIAGNOSIS — K219 Gastro-esophageal reflux disease without esophagitis: Secondary | ICD-10-CM | POA: Insufficient documentation

## 2023-06-11 DIAGNOSIS — J449 Chronic obstructive pulmonary disease, unspecified: Secondary | ICD-10-CM | POA: Diagnosis not present

## 2023-06-11 DIAGNOSIS — Z01818 Encounter for other preprocedural examination: Secondary | ICD-10-CM

## 2023-06-11 HISTORY — PX: BREAST LUMPECTOMY WITH RADIOACTIVE SEED LOCALIZATION: SHX6424

## 2023-06-11 SURGERY — BREAST LUMPECTOMY WITH RADIOACTIVE SEED LOCALIZATION
Anesthesia: General | Site: Breast | Laterality: Left

## 2023-06-11 MED ORDER — DEXAMETHASONE SODIUM PHOSPHATE 10 MG/ML IJ SOLN
INTRAMUSCULAR | Status: DC | PRN
  Administered 2023-06-11: 10 mg via INTRAVENOUS

## 2023-06-11 MED ORDER — CHLORHEXIDINE GLUCONATE CLOTH 2 % EX PADS
6.0000 | MEDICATED_PAD | Freq: Once | CUTANEOUS | Status: DC
Start: 2023-06-11 — End: 2023-06-11

## 2023-06-11 MED ORDER — OXYCODONE HCL 5 MG PO TABS: 5.0000 mg | ORAL_TABLET | Freq: Once | ORAL | Status: DC | PRN

## 2023-06-11 MED ORDER — BUPIVACAINE-EPINEPHRINE (PF) 0.25% -1:200000 IJ SOLN
INTRAMUSCULAR | Status: DC | PRN
  Administered 2023-06-11: 20 mL

## 2023-06-11 MED ORDER — OXYCODONE HCL 5 MG/5ML PO SOLN: 5.0000 mg | Freq: Once | ORAL | Status: DC | PRN

## 2023-06-11 MED ORDER — ONDANSETRON HCL 4 MG/2ML IJ SOLN: 4.0000 mg | Freq: Once | INTRAMUSCULAR | Status: DC | PRN

## 2023-06-11 MED ORDER — CEFAZOLIN SODIUM-DEXTROSE 2-3 GM-%(50ML) IV SOLR
INTRAVENOUS | Status: DC | PRN
  Administered 2023-06-11: 2 g via INTRAVENOUS

## 2023-06-11 MED ORDER — OXYCODONE HCL 5 MG PO TABS: 5.0000 mg | ORAL_TABLET | Freq: Four times a day (QID) | ORAL | 0 refills | Status: DC | PRN

## 2023-06-11 MED ORDER — PROPOFOL 10 MG/ML IV BOLUS
INTRAVENOUS | Status: DC | PRN
  Administered 2023-06-11: 150 ug via INTRAVENOUS

## 2023-06-11 MED ORDER — FENTANYL CITRATE (PF) 100 MCG/2ML IJ SOLN: 25.0000 ug | INTRAMUSCULAR | Status: DC | PRN

## 2023-06-11 MED ORDER — FENTANYL CITRATE (PF) 100 MCG/2ML IJ SOLN
INTRAMUSCULAR | Status: DC | PRN
  Administered 2023-06-11: 100 ug via INTRAVENOUS

## 2023-06-11 MED ORDER — MIDAZOLAM HCL 2 MG/2ML IJ SOLN
INTRAMUSCULAR | Status: AC
  Filled 2023-06-11: qty 2

## 2023-06-11 MED ORDER — LACTATED RINGERS IV SOLN: INTRAVENOUS | Status: DC

## 2023-06-11 MED ORDER — MIDAZOLAM HCL 2 MG/2ML IJ SOLN
INTRAMUSCULAR | Status: DC | PRN
  Administered 2023-06-11: 2 mg via INTRAVENOUS

## 2023-06-11 MED ORDER — ACETAMINOPHEN 10 MG/ML IV SOLN: 1000.0000 mg | Freq: Once | INTRAVENOUS | Status: DC | PRN

## 2023-06-11 MED ORDER — LIDOCAINE 2% (20 MG/ML) 5 ML SYRINGE
INTRAMUSCULAR | Status: DC | PRN
  Administered 2023-06-11: 60 mg via INTRAVENOUS

## 2023-06-11 MED ORDER — EPHEDRINE SULFATE-NACL 50-0.9 MG/10ML-% IV SOSY: PREFILLED_SYRINGE | INTRAVENOUS | Status: DC | PRN

## 2023-06-11 MED ORDER — CHLORHEXIDINE GLUCONATE CLOTH 2 % EX PADS: 6.0000 | MEDICATED_PAD | Freq: Once | CUTANEOUS | Status: DC

## 2023-06-11 MED ORDER — ONDANSETRON HCL 4 MG/2ML IJ SOLN
INTRAMUSCULAR | Status: DC | PRN
  Administered 2023-06-11: 4 mg via INTRAVENOUS

## 2023-06-11 MED ORDER — CEFAZOLIN SODIUM-DEXTROSE 2-4 GM/100ML-% IV SOLN
INTRAVENOUS | Status: AC
  Filled 2023-06-11: qty 100

## 2023-06-11 MED ORDER — FENTANYL CITRATE (PF) 100 MCG/2ML IJ SOLN
INTRAMUSCULAR | Status: AC
  Filled 2023-06-11: qty 2

## 2023-06-11 MED ORDER — BUPIVACAINE-EPINEPHRINE (PF) 0.25% -1:200000 IJ SOLN
INTRAMUSCULAR | Status: AC
  Filled 2023-06-11: qty 30

## 2023-06-11 MED ORDER — CEFAZOLIN SODIUM-DEXTROSE 3-4 GM/150ML-% IV SOLN: 3.0000 g | INTRAVENOUS | Status: DC

## 2023-06-11 MED ORDER — PROPOFOL 10 MG/ML IV BOLUS
INTRAVENOUS | Status: AC
  Filled 2023-06-11: qty 20

## 2023-06-11 SURGICAL SUPPLY — 41 items
APPLIER CLIP 9.375 MED OPEN (MISCELLANEOUS) ×1 IMPLANT
BINDER BREAST 3XL (GAUZE/BANDAGES/DRESSINGS) IMPLANT
BINDER BREAST XLRG (GAUZE/BANDAGES/DRESSINGS) IMPLANT
BINDER BREAST XXLRG (GAUZE/BANDAGES/DRESSINGS) IMPLANT
BLADE SURG 15 STRL LF DISP TIS (BLADE) ×2 IMPLANT
CHLORAPREP W/TINT 26 (MISCELLANEOUS) ×2 IMPLANT
CLIP APPLIE 9.375 MED OPEN (MISCELLANEOUS) IMPLANT
COVER BACK TABLE 60X90IN (DRAPES) ×2 IMPLANT
COVER MAYO STAND STRL (DRAPES) ×2 IMPLANT
COVER PROBE CYLINDRICAL 5X96 (MISCELLANEOUS) ×2 IMPLANT
DERMABOND ADVANCED .7 DNX12 (GAUZE/BANDAGES/DRESSINGS) ×2 IMPLANT
DRAIN CHANNEL 19F RND (DRAIN) IMPLANT
DRAPE LAPAROSCOPIC ABDOMINAL (DRAPES) IMPLANT
DRAPE UTILITY XL STRL (DRAPES) ×2 IMPLANT
ELECT COATED BLADE 2.86 ST (ELECTRODE) ×2 IMPLANT
ELECT REM PT RETURN 9FT ADLT (ELECTROSURGICAL) ×1 IMPLANT
ELECTRODE REM PT RTRN 9FT ADLT (ELECTROSURGICAL) ×2 IMPLANT
EVACUATOR SILICONE 100CC (DRAIN) IMPLANT
GLOVE BIOGEL PI IND STRL 8 (GLOVE) ×2 IMPLANT
GLOVE ECLIPSE 8.0 STRL XLNG CF (GLOVE) ×2 IMPLANT
GOWN STRL REUS W/ TWL LRG LVL3 (GOWN DISPOSABLE) ×4 IMPLANT
GOWN STRL REUS W/ TWL XL LVL3 (GOWN DISPOSABLE) ×2 IMPLANT
HEMOSTAT ARISTA ABSORB 3G PWDR (HEMOSTASIS) IMPLANT
HEMOSTAT SNOW SURGICEL 2X4 (HEMOSTASIS) IMPLANT
KIT MARKER MARGIN INK (KITS) ×2 IMPLANT
NDL HYPO 25X1 1.5 SAFETY (NEEDLE) ×2 IMPLANT
NEEDLE HYPO 25X1 1.5 SAFETY (NEEDLE) ×1 IMPLANT
NS IRRIG 1000ML POUR BTL (IV SOLUTION) ×2 IMPLANT
PACK BASIN DAY SURGERY FS (CUSTOM PROCEDURE TRAY) ×2 IMPLANT
PENCIL SMOKE EVACUATOR (MISCELLANEOUS) ×2 IMPLANT
SLEEVE SCD COMPRESS KNEE MED (STOCKING) ×2 IMPLANT
SPONGE T-LAP 4X18 ~~LOC~~+RFID (SPONGE) ×2 IMPLANT
SUT ETHILON 2 0 FS 18 (SUTURE) IMPLANT
SUT MNCRL AB 4-0 PS2 18 (SUTURE) ×2 IMPLANT
SUT SILK 2 0 SH (SUTURE) IMPLANT
SUT VICRYL 3-0 CR8 SH (SUTURE) ×2 IMPLANT
SYR CONTROL 10ML LL (SYRINGE) ×2 IMPLANT
TOWEL GREEN STERILE FF (TOWEL DISPOSABLE) ×2 IMPLANT
TRAY FAXITRON CT DISP (TRAY / TRAY PROCEDURE) ×2 IMPLANT
TUBE CONNECTING 20X1/4 (TUBING) IMPLANT
YANKAUER SUCT BULB TIP NO VENT (SUCTIONS) IMPLANT

## 2023-06-11 NOTE — Transfer of Care (Signed)
 Immediate Anesthesia Transfer of Care Note  Patient: Brandy Stevenson  Procedure(s) Performed: BREAST LUMPECTOMY WITH RADIOACTIVE SEED LOCALIZATION (Left: Breast)  Patient Location: PACU  Anesthesia Type:General  Level of Consciousness: drowsy and patient cooperative  Airway & Oxygen Therapy: Patient Spontanous Breathing and Patient connected to face mask oxygen  Post-op Assessment: Report given to RN and Post -op Vital signs reviewed and stable  Post vital signs: Reviewed and stable  Last Vitals:  Vitals Value Taken Time  BP 135/82 06/11/23 0830  Temp    Pulse 67 06/11/23 0833  Resp 15 06/11/23 0833  SpO2 99 % 06/11/23 0833  Vitals shown include unfiled device data.  Last Pain:  Vitals:   06/11/23 1610  TempSrc: Oral         Complications: No notable events documented.

## 2023-06-11 NOTE — Op Note (Signed)
 Preoperative diagnosis: Left breast DCIS upper outer quadrant  Postoperative diagnosis: Same  Procedure: Left breast seed localized lumpectomy  Surgeon: Harriette Bouillon, MD  Assistant: Herma Mering, RNFA  Anesthesia: LMA with 0.25% Marcaine with epinephrine  EBL: Minimal  Specimen: Left breast tissue with seed and clip verified by Faxitron, additional medial posterior margin sent separately.  All tissue oriented with ink.  Drains: 19 round  IV fluids: Per anesthesia record  Indications for procedure: The patient is a 68 year old female with a previous history of left breast DCIS over 10 years ago.  She was treated with breast conserving surgery radiation therapy.  She was seen in the [facility] and evaluated.  She had a small focus of intermediate grade DCIS in her left breast measuring less than a centimeter.  We reviewed mastectomy as the standard of care but also reviewed literature on breast conserving surgery in the setting and outcomes with that.  We felt these outcomes were still quite good and after being seen by the multidisciplinary team, the patient desired to conserve her breast.  She wished to undergo lumpectomy.  I reviewed the pros and cons of this as well as potential local regional recurrence rates in the future and that these might be slightly elevated than baseline risk compared to previous traditional therapy of mastectomy.  We also discussed possible wound healing issues and cosmetic issues in the setting.  After reviewing the above she wished to proceed with breast conserving surgery.The procedure has been discussed with the patient. Alternatives to surgery have been discussed with the patient.  Risks of surgery include bleeding,  Infection,  Seroma formation, death,  and the need for further surgery.   The patient understands and wishes to proceed.    Description of procedure: The patient was met in the holding area and questions were answered.  Of note his seed was placed as an  outpatient in the left breast.  Films were available for review.  She was then taken back to the operative room.  She was placed supine upon the OR table.  After induction of general anesthesia, left breast was prepped and draped in sterile fashion.  Timeout performed.  Neoprobe used to identify the seed left breast upper outer quadrant.  Films were available for review for guidance.  A curvilinear incision was made over the signal.  Dissection was carried down all tissue was excised with grossly negative margins around the seed and clip.  This was oriented with ink.  Imaging revealed both the seed and clip to be present.  Grossly, the medial margin left close therefore took additional medial and posterior margin and submitted the separately after orienting with ink.  The cavity was irrigated.  She had significant postradiation changes.  Sutures were not holding the tissue well together.  I opted to place a drain due to the poor quality of her breast tissue as opposed to try to close this in.  This was placed through a separate stab incision and a 19 round drain was placed and secured to the skin with 2-0 nylon.  We then approximated the skin edges with 3-0 Vicryl.  4 Monocryl was used to close the skin in a subcuticular fashion.  Dermabond was applied.  All counts found to be correct.  Breast binder placed.  The patient was awoke extubated taken to recovery in satisfactory condition.

## 2023-06-11 NOTE — Anesthesia Postprocedure Evaluation (Signed)
 Anesthesia Post Note  Patient: Brandy Stevenson  Procedure(s) Performed: BREAST LUMPECTOMY WITH RADIOACTIVE SEED LOCALIZATION (Left: Breast)     Patient location during evaluation: PACU Anesthesia Type: General Level of consciousness: awake and alert Pain management: pain level controlled Vital Signs Assessment: post-procedure vital signs reviewed and stable Respiratory status: spontaneous breathing, nonlabored ventilation, respiratory function stable and patient connected to nasal cannula oxygen Cardiovascular status: blood pressure returned to baseline and stable Postop Assessment: no apparent nausea or vomiting Anesthetic complications: no   No notable events documented.  Last Vitals:  Vitals:   06/11/23 0915 06/11/23 0930  BP: (!) 142/84 (!) 143/83  Pulse: (!) 59   Resp: 12 16  Temp:  36.6 C  SpO2: 91% 94%    Last Pain:  Vitals:   06/11/23 1610  TempSrc: Oral                 Mariann Barter

## 2023-06-11 NOTE — Interval H&P Note (Signed)
 History and Physical Interval Note:  06/11/2023 7:18 AM  Brandy Stevenson  has presented today for surgery, with the diagnosis of LEFT BREAST CANCER.  The various methods of treatment have been discussed with the patient and family. After consideration of risks, benefits and other options for treatment, the patient has consented to  Procedure(s) with comments: BREAST LUMPECTOMY WITH RADIOACTIVE SEED LOCALIZATION (Left) - LEFT BREAST SEED LUMPECTOMY as a surgical intervention.  The patient's history has been reviewed, patient examined, no change in status, stable for surgery.  I have reviewed the patient's chart and labs.  Questions were answered to the patient's satisfaction.   The procedure has been discussed with the patient. Alternatives to surgery have been discussed with the patient.  Risks of surgery include bleeding,  Infection,  Seroma formation, death,  and the need for further surgery.   The patient understands and wishes to proceed.   [name] A Mance Vallejo

## 2023-06-11 NOTE — Anesthesia Preprocedure Evaluation (Signed)
 Anesthesia Evaluation  Patient identified by MRN, date of birth, ID band Patient awake    Reviewed: Allergy & Precautions, NPO status , Patient's Chart, lab work & pertinent test results, reviewed documented beta blocker date and time   History of Anesthesia Complications Negative for: history of anesthetic complications  Airway Mallampati: II  TM Distance: >3 FB     Dental  (+) Edentulous Upper, Edentulous Lower   Pulmonary neg sleep apnea, COPD, former smoker, neg PE   breath sounds clear to auscultation       Cardiovascular hypertension, + Peripheral Vascular Disease (s/p aortobifem) and + DOE  (-) CAD, (-) Past MI, (-) Cardiac Stents and (-) CABG (-) pacemaker(-) Cardiac Defibrillator  Rhythm:Regular Rate:Normal  Clean coronary CTA 2023   Neuro/Psych neg Seizures PSYCHIATRIC DISORDERS Anxiety Depression     Neuromuscular disease    GI/Hepatic hiatal hernia,GERD  Medicated and Controlled,,(+) neg Cirrhosis        Endo/Other    Renal/GU Renal disease     Musculoskeletal  (+) Arthritis , Osteoarthritis,    Abdominal   Peds  Hematology   Anesthesia Other Findings   Reproductive/Obstetrics                              Anesthesia Physical Anesthesia Plan  ASA: 3  Anesthesia Plan: General   Post-op Pain Management:    Induction: Intravenous  PONV Risk Score and Plan: 3 and Ondansetron and Dexamethasone  Airway Management Planned: LMA  Additional Equipment:   Intra-op Plan:   Post-operative Plan: Extubation in OR  Informed Consent: I have reviewed the patients History and Physical, chart, labs and discussed the procedure including the risks, benefits and alternatives for the proposed anesthesia with the patient or authorized representative who has indicated his/her understanding and acceptance.     Dental advisory given  Plan Discussed with: CRNA  Anesthesia Plan  Comments:          Anesthesia Quick Evaluation

## 2023-06-11 NOTE — Discharge Instructions (Addendum)
 [facility] Office Phone Number [phone]  BREAST BIOPSY/ PARTIAL MASTECTOMY: POST OP INSTRUCTIONS  Always review your discharge instruction sheet given to you by the facility where your surgery was performed.  IF YOU HAVE DISABILITY OR FAMILY LEAVE FORMS, YOU MUST BRING THEM TO THE OFFICE FOR PROCESSING.  DO NOT GIVE THEM TO YOUR DOCTOR.  A prescription for pain medication may be given to you upon discharge.  Take your pain medication as prescribed, if needed.  If narcotic pain medicine is not needed, then you may take acetaminophen (Tylenol) or ibuprofen (Advil) as needed. Take your usually prescribed medications unless otherwise directed If you need a refill on your pain medication, please contact your pharmacy.  They will contact our office to request authorization.  Prescriptions will not be filled after 5pm or on week-ends. You should eat very light the first 24 hours after surgery, such as soup, crackers, pudding, etc.  Resume your normal diet the day after surgery. Most patients will experience some swelling and bruising in the breast.  Ice packs and a good support bra will help.  Swelling and bruising can take several days to resolve.  It is common to experience some constipation if taking pain medication after surgery.  Increasing fluid intake and taking a stool softener will usually help or prevent this problem from occurring.  A mild laxative (Milk of Magnesia or Miralax) should be taken according to package directions if there are no bowel movements after 48 hours. Unless discharge instructions indicate otherwise, you may remove your bandages 24-48 hours after surgery, and you may shower at that time.  You may have steri-strips (small skin tapes) in place directly over the incision.  These strips should be left on the skin for 7-10 days.  If your surgeon used skin glue on the incision, you may shower in 24 hours.  The glue will flake off over the next 2-3 weeks.  Any  sutures or staples will be removed at the office during your follow-up visit. ACTIVITIES:  You may resume regular daily activities (gradually increasing) beginning the next day.  Wearing a good support bra or sports bra minimizes pain and swelling.  You may have sexual intercourse when it is comfortable. You may drive when you no longer are taking prescription pain medication, you can comfortably wear a seatbelt, and you can safely maneuver your car and apply brakes. RETURN TO WORK:  ______________________________________________________________________________________ Brandy Stevenson should see your doctor in the office for a follow-up appointment approximately two weeks after your surgery.  Your doctor's nurse will typically make your follow-up appointment when she calls you with your pathology report.  Expect your pathology report 2-3 business days after your surgery.  You may call to check if you do not hear from [place] after three days. OTHER INSTRUCTIONS: _______________________________________________________________________________________________ _____________________________________________________________________________________________________________________________________ _____________________________________________________________________________________________________________________________________ _____________________________________________________________________________________________________________________________________  WHEN TO CALL YOUR DOCTOR: Fever over 101.0 Nausea and/or vomiting. Extreme swelling or bruising. Continued bleeding from incision. Increased pain, redness, or drainage from the incision.  The clinic staff is available to answer your questions during regular business hours.  Please don't hesitate to call and ask to speak to one of the nurses for clinical concerns.  If you have a medical emergency, go to the nearest emergency room or call 911.  A surgeon from [facility] is always on call at the hospital.  For further questions, please visit [email or web address]     Post Anesthesia Home Care Instructions  Activity: Get plenty of rest for the remainder  of the day. A responsible individual must stay with you for 24 hours following the procedure.  For the next 24 hours, DO NOT: -Drive a car -Advertising copywriter -Drink alcoholic beverages -Take any medication unless instructed by your physician -Make any legal decisions or sign important papers.  Meals: Start with liquid foods such as gelatin or soup. Progress to regular foods as tolerated. Avoid greasy, spicy, heavy foods. If nausea and/or vomiting occur, drink only clear liquids until the nausea and/or vomiting subsides. Call your physician if vomiting continues.  Special Instructions/Symptoms: Your throat may feel dry or sore from the anesthesia or the breathing tube placed in your throat during surgery. If this causes discomfort, gargle with warm salt water. The discomfort should disappear within 24 hours.  If you had a scopolamine patch placed behind your ear for the management of post- operative nausea and/or vomiting:  1. The medication in the patch is effective for 72 hours, after which it should be removed.  Wrap patch in a tissue and discard in the trash. Wash hands thoroughly with soap and water. 2. You may remove the patch earlier than 72 hours if you experience unpleasant side effects which may include dry mouth, dizziness or visual disturbances. 3. Avoid touching the patch. Wash your hands with soap and water after contact with the patch.    About my Jackson-Pratt Bulb Drain  What is a Jackson-Pratt bulb? A Jackson-Pratt is a soft, round device used to collect drainage. It is connected to a long, thin drainage catheter, which is held in place by one or two small stiches near your surgical incision site. When the bulb is squeezed, it forms a vacuum, forcing the drainage to  empty into the bulb.  Emptying the Jackson-Pratt bulb- To empty the bulb: 1. Release the plug on the top of the bulb. 2. Pour the bulb's contents into a measuring container which your nurse will provide. 3. Record the time emptied and amount of drainage. Empty the drain(s) as often as your     doctor or nurse recommends.  Date                  Time                    Amount (Drain 1)                 Amount (Drain 2)  _____________________________________________________________________  _____________________________________________________________________  _____________________________________________________________________  _____________________________________________________________________  _____________________________________________________________________  _____________________________________________________________________  _____________________________________________________________________  _____________________________________________________________________  Squeezing the Jackson-Pratt Bulb- To squeeze the bulb: 1. Make sure the plug at the top of the bulb is open. 2. Squeeze the bulb tightly in your fist. You will hear air squeezing from the bulb. 3. Replace the plug while the bulb is squeezed. 4. Use a safety pin to attach the bulb to your clothing. This will keep the catheter from     pulling at the bulb insertion site.  When to call your doctor- Call your doctor if: Drain site becomes red, swollen or hot. You have a fever greater than 101 degrees F. There is oozing at the drain site. Drain falls out (apply a guaze bandage over the drain hole and secure it with tape). Drainage increases daily not related to activity patterns. (You will usually have more drainage when you are active than when you are resting.) Drainage has a bad odor.

## 2023-06-11 NOTE — Anesthesia Procedure Notes (Signed)
 Procedure Name: LMA Insertion Date/Time: 06/11/2023 7:39 AM  Performed by: Alvera Novel, CRNAPre-anesthesia Checklist: Patient identified, Emergency Drugs available, Suction available and Patient being monitored Patient Re-evaluated:Patient Re-evaluated prior to induction Oxygen Delivery Method: Circle System Utilized Preoxygenation: Pre-oxygenation with 100% oxygen Induction Type: IV induction Ventilation: Mask ventilation without difficulty LMA: LMA inserted LMA Size: 4.0 Number of attempts: 1 Placement Confirmation: positive ETCO2 Tube secured with: Tape Dental Injury: Teeth and Oropharynx as per pre-operative assessment

## 2023-06-11 NOTE — H&P (Signed)
 History of Present Illness: Brandy Stevenson is a 68 y.o. female who is seen today as an office consultation for evaluation of Breast Cancer  Patient seen today in the [facility] for newly diagnosed left breast DCIS on mammogram. Patient has history of breast conserving surgery in 2009 consisting of lumpectomy, radiation therapy. She received no antiestrogen therapy at that time. Denies any history of breast pain breast mass or nipple discharge.   Review of Systems: A complete review of systems was obtained from the patient. I have reviewed this information and discussed as appropriate with the patient. See HPI as well for other ROS.    Medical History: Past Medical History:  Diagnosis Date  Anxiety  COPD (chronic obstructive pulmonary disease) (CMS/HHS-HCC)  GERD (gastroesophageal reflux disease)  Hyperlipidemia   Patient Active Problem List  Diagnosis  Accelerated hypertension  Anxiety and depression  Aortoiliac occlusive disease (CMS/HHS-HCC)  Atherosclerotic PVD with intermittent claudication (CMS-HCC)  Chronic chest wall pain  Chronic midline low back pain without sciatica  COPD (chronic obstructive pulmonary disease) (CMS/HHS-HCC)  Cervical radiculopathy  DOE (dyspnea on exertion)  Ductal carcinoma in situ (DCIS) of left breast  Foot callus  Former smoker   Past Surgical History:  Procedure Laterality Date  APPENDECTOMY N/A 1980  .Tubal ligation N/A 1991  Colonoscopy with propofo N/A 06/26/2015  COLONOSCOPY N/A 07/2015  Peripheral vascular catheterization N/A 10/22/2015  Transthoracic echocardiogram N/A 08/2017  Ct cta coronary w/ca score w/cm &/or wo/cm N/A 12/10/2017  LEFT AXILLOBIFEMORAL BYPASS GRAFT (Bilateral: Groin) 10/29/2021  Dr. Ashley Royalty  Breast biopsy (Left) Left 05/12/2023  Breast lumpectomy (Left) Left  Date unknown  Breast surgery N/A  Date unknown    Allergies  Allergen Reactions  Hydrocodone-Acetaminophen Nausea And Vomiting and Other (See  Comments)  Sweating  Bupropion Other (See Comments) and Unknown  Made me feel bad and sleepy  Sulfamethoxazole-Trimethoprim Nausea And Vomiting  HA  Tramadol Nausea And Vomiting  Umeclidinium Bromide Other (See Comments) and Unknown  Dry mouth   Current Outpatient Medications on File Prior to Visit  Medication Sig Dispense Refill  acetaminophen (TYLENOL) 500 MG tablet Take 1,000 mg by mouth every 8 (eight) hours  albuterol MDI, PROVENTIL, VENTOLIN, PROAIR, HFA 90 mcg/actuation inhaler Inhale into the lungs  amLODIPine (NORVASC) 5 MG tablet Take 5 mg by mouth once daily  aspirin 81 MG EC tablet Take 1 tablet by mouth once daily  atorvastatin (LIPITOR) 80 MG tablet Take 1 tablet by mouth at bedtime  carvediloL (COREG) 6.25 MG tablet Take 6.25 mg by mouth 2 (two) times daily  ergocalciferol, vitamin D2, 1,250 mcg (50,000 unit) capsule Take 50,000 Units by mouth every 7 (seven) days  fluticasone propion-salmeteroL (ADVAIR DISKUS) 250-50 mcg/dose diskus inhaler Inhale into the lungs  gabapentin (NEURONTIN) 300 MG capsule Take 1 capsule by mouth at bedtime  hydrALAZINE (APRESOLINE) 25 MG tablet Take 25 mg by mouth 3 (three) times daily  meloxicam (MOBIC) 15 MG tablet Take 1 tablet by mouth once daily  menthol 5 % PtMd Apply 0.5 patches topically as directed  omeprazole (PRILOSEC) 40 MG DR capsule Take 1 capsule by mouth once daily  potassium chloride (KLOR-CON) 10 MEQ ER tablet Take by mouth  spironolactone (ALDACTONE) 25 MG tablet Take 1 tablet by mouth once daily  tiotropium (SPIRIVA) 18 mcg inhalation capsule Place into inhaler and inhale   No current facility-administered medications on file prior to visit.   Family History  Problem Relation Age of Onset  Hyperlipidemia (Elevated cholesterol) Mother  High blood pressure (Hypertension) Mother  Heart disease Mother  Coronary Artery Disease (Blocked arteries around heart) Sister  Hyperlipidemia (Elevated cholesterol) Sister  High  blood pressure (Hypertension) Sister  Coronary Artery Disease (Blocked arteries around heart) Brother  Myocardial Infarction (Heart attack) Brother  Hyperlipidemia (Elevated cholesterol) Brother  High blood pressure (Hypertension) Brother    Social History   Tobacco Use  Smoking Status Former  Current packs/day: 0.00  Types: Cigarettes  Quit date: 01/2017  Years since quitting: 6.3  Smokeless Tobacco Never    Social History   Socioeconomic History  Marital status: Married  Tobacco Use  Smoking status: Former  Current packs/day: 0.00  Types: Cigarettes  Quit date: 01/2017  Years since quitting: 6.3  Smokeless tobacco: Never  Vaping Use  Vaping status: Never Used  Substance and Sexual Activity  Alcohol use: Defer  Drug use: Never   Social Drivers of Corporate investment banker Strain: Low Risk (10/21/2022)  Received from [facility]  Overall Financial Resource Strain (CARDIA)  Difficulty of Paying Living Expenses: Not hard at all  Food Insecurity: No Food Insecurity (12/25/2022)  Received from [facility]  Hunger Vital Sign  Worried About Running Out of Food in the Last Year: Never true  Ran Out of Food in the Last Year: Never true  Transportation Needs: No Transportation Needs (12/25/2022)  Received from [facility] - Transportation  Lack of Transportation (Medical): No  Lack of Transportation (Non-Medical): No  Physical Activity: Inactive (12/25/2022)  Received from [facility]  Exercise Vital Sign  Days of Exercise per Week: 0 days  Minutes of Exercise per Session: 30 min  Stress: No Stress Concern Present (12/25/2022)  Received from [facility] of Occupational Health - Occupational Stress Questionnaire  Feeling of Stress : Only a little  Social Connections: Socially Integrated (12/25/2022)  Received from [facility]  Social Connection and Isolation Panel [NHANES]  Frequency of Communication with Friends and Family: More than  three times a week  Frequency of Social Gatherings with Friends and Family: Twice a week  Attends Religious Services: More than 4 times per year  Active Member of [employer] or Organizations: Yes  Attends Banker Meetings: 1 to 4 times per year  Marital Status: Married   Objective:  There were no vitals filed for this visit.  There is no height or weight on file to calculate BMI.  Physical Exam Exam conducted with a chaperone present.  HENT:  Head: Normocephalic.  Eyes:  Pupils: Pupils are equal, round, and reactive to light.  Chest:  Breasts: Right: Normal.   Comments: Left breast shows postradiation and postsurgical changes. There is mild cosmetic deformity and shrinkage of left breast. No masses noted. Musculoskeletal:  General: Normal range of motion.  Lymphadenopathy:  Upper Body:  Right upper body: No supraclavicular or axillary adenopathy.  Left upper body: No supraclavicular or axillary adenopathy.  Skin: General: Skin is warm.  Neurological:  General: No focal deficit present.  Mental Status: She is alert.  Psychiatric:  Mood and Affect: Mood normal.     Labs, Imaging and Diagnostic Testing: Left breast shows 8 mm nodule upper quadrant core biopsy proven to be high-grade DCIS.  Assessment and Plan:   Diagnoses and all orders for this visit:  Ductal carcinoma in situ (DCIS) of left breast   History of left breast DCIS 2009 treated with lumpectomy and radiation therapy. She received no systemic therapy. I reviewed options of left simple  mastectomy with or without reconstruction versus lumpectomy. Reviewed the literature of postlumpectomy after radiation therapy. She was seen by radiation oncology and medical oncology. The standard of care has been mastectomy in the past but certainly she is a candidate for repeat lumpectomy. I reviewed potential increased local regional recurrence rates of up to 10 to 15% compared to the average baseline of 6%. She will  also receive systemic antiestrogen therapy to reduce her risk. Given her large breast size I think there would be a significant discrepancy with mastectomy and would strongly encouraged her to consider reconstruction if she does that route. After discussing with all the consultants, we feel that left breast lumpectomy would be her best choice given the fact that her breast size and postop pain may be significant from asymmetry. She understands the potential increased risk of recurrence but she will receive antiestrogen therapy which she did not receive before. Plan will be for left breast seed localized lumpectomy. Risk of bleeding, infection, poor wound healing, cosmetic deformity, exacerbation of underlying medical problems, additional surgery, potential mastectomy down the road of all reviewed today.   Hayden Rasmussen, MD

## 2023-06-12 ENCOUNTER — Encounter (HOSPITAL_BASED_OUTPATIENT_CLINIC_OR_DEPARTMENT_OTHER): Payer: Self-pay | Admitting: Surgery

## 2023-06-15 LAB — SURGICAL PATHOLOGY

## 2023-06-16 ENCOUNTER — Ambulatory Visit: Attending: Internal Medicine | Admitting: Internal Medicine

## 2023-06-16 ENCOUNTER — Other Ambulatory Visit: Payer: Self-pay

## 2023-06-16 VITALS — BP 148/78 | HR 62 | Ht 65.0 in | Wt 204.0 lb

## 2023-06-16 DIAGNOSIS — J449 Chronic obstructive pulmonary disease, unspecified: Secondary | ICD-10-CM | POA: Diagnosis not present

## 2023-06-16 DIAGNOSIS — D0512 Intraductal carcinoma in situ of left breast: Secondary | ICD-10-CM | POA: Diagnosis not present

## 2023-06-16 DIAGNOSIS — Z6379 Other stressful life events affecting family and household: Secondary | ICD-10-CM

## 2023-06-16 DIAGNOSIS — I10 Essential (primary) hypertension: Secondary | ICD-10-CM

## 2023-06-16 DIAGNOSIS — R7303 Prediabetes: Secondary | ICD-10-CM | POA: Diagnosis not present

## 2023-06-16 LAB — POCT GLYCOSYLATED HEMOGLOBIN (HGB A1C): HbA1c, POC (prediabetic range): 6.1 % (ref 5.7–6.4)

## 2023-06-16 LAB — GLUCOSE, POCT (MANUAL RESULT ENTRY): POC Glucose: 116 mg/dL — AB (ref 70–99)

## 2023-06-16 MED ORDER — NYSTATIN 100000 UNIT/ML MT SUSP
5.0000 mL | Freq: Four times a day (QID) | OROMUCOSAL | 0 refills | Status: DC
  Filled 2023-06-16: qty 100, 5d supply, fill #0

## 2023-06-16 NOTE — Progress Notes (Unsigned)
 Patient ID: Brandy Stevenson, female    DOB: [birth year]  MRN: [id number]  CC: Hypertension (HTN f/u. Med refill/Requesting for L breast drain to be checked )   Subjective: Brandy Stevenson is a 68 y.o. female who presents for chronic ds management. Her concerns today include:  Hx of PAD (axillobifemoral bypass graft BL 10/2021 ), HTN, HL, former tob dep, LT breast CA (lumpectomy and XRT 2009), GERD, COPD, Trigeminal neuralgia RT, dep/anxiety, OA shoulders   Discussed the use of AI scribe software for clinical note transcription with the patient, who gave verbal consent to proceed.  History of Present Illness   Miss Brandy Stevenson, a patient with a history of breast cancer, hypertension, and prediabetes, presents with concerns following a recent surgery for a recurrence of breast cancer. The surgery involved radiation seed implants and a drain was left in place post-surgery. The patient reports discomfort due to the drain and difficulty managing it at home. She also expresses concern about the amount of drainage, which she feels is less than expected. She has not yet started this therapy. The patient also mentions difficulty finding a comfortable and supportive bra due to her heavy breasts and the presence of the drain.   The patient's blood pressure was found to be elevated during the visit, but she reports taking her blood pressure medications as prescribed. She has not been monitoring her blood pressure at home.   Prediabetes: A1c done today reveals she is still in the range for prediabetes       Patient Active Problem List   Diagnosis Date Noted   Ductal carcinoma in situ (DCIS) of left breast 05/18/2023   Osteopenia of femoral neck 10/22/2022   Prediabetes 06/15/2021   Influenza vaccine refused 02/03/2020   Resistant hypertension 01/20/2020   Tubular adenoma of colon 04/27/2019   Cervical radiculopathy 10/01/2018   Hypertensive retinopathy 09/08/2018   Seasonal allergies  06/29/2018   Aortoiliac occlusive disease (HCC) 11/04/2017   DOE (dyspnea on exertion) 11/03/2017   Chronic chest wall pain 11/03/2017   Ichthyosis 10/06/2017   Former smoker 04/07/2017   Primary osteoarthritis of right shoulder 04/07/2017   Chronic midline low back pain without sciatica 04/07/2017   Hiatal hernia 05/01/2016   DJD (degenerative joint disease) of cervical spine 04/17/2016   History of breast cancer 02/18/2016   Foot callus 02/18/2016   Anxiety and depression 05/22/2015   Insomnia 03/15/2015   Vitamin D deficiency 04/08/2013   Hyperlipidemia with target LDL less than 70 04/08/2013   Atherosclerotic PVD with intermittent claudication (HCC) 03/22/2013   Accelerated hypertension 02/23/2013   GERD (gastroesophageal reflux disease) 02/23/2013   COPD (chronic obstructive pulmonary disease) (HCC) 02/23/2013     Current Outpatient Medications on File Prior to Visit  Medication Sig Dispense Refill   acetaminophen (TYLENOL) 500 MG tablet Take 1,000 mg by mouth every 8 (eight) hours.     albuterol (PROAIR HFA) 108 (90 Base) MCG/ACT inhaler Inhale 2 puffs into the lungs every 6 (six) hours as needed for wheezing or shortness of breath. 6.7 g 2   amLODipine (NORVASC) 5 MG tablet Take 1 tablet (5 mg total) by mouth daily. 90 tablet 1   aspirin EC 81 MG tablet Take 1 tablet (81 mg total) by mouth daily. 90 tablet 3   atorvastatin (LIPITOR) 80 MG tablet TAKE 1 TABLET BY MOUTH AT  BEDTIME 100 tablet 2   carvedilol (COREG) 6.25 MG tablet Take 1 tablet (6.25 mg total) by mouth 2 (two) times  daily. 180 tablet 1   fluticasone-salmeterol (ADVAIR) 250-50 MCG/ACT AEPB Inhale 1 puff into the lungs in the morning and at bedtime. 120 each 2   gabapentin (NEURONTIN) 300 MG capsule TAKE 1 CAPSULE BY MOUTH AT  BEDTIME 100 capsule 2   hydrALAZINE (APRESOLINE) 25 MG tablet Take 1 tablet (25 mg total) by mouth 3 (three) times daily. 270 tablet 1   meloxicam (MOBIC) 15 MG tablet TAKE 1 TABLET BY MOUTH  DAILY 90 tablet 0   Menthol, Topical Analgesic, (BIOFREEZE) 5 % PTCH Apply 0.5 patches topically daily as needed (pain).     nystatin (MYCOSTATIN) 100000 UNIT/ML suspension SWISH AND SWALLOW 5 MLS (500,000 UNITS TOTAL) BY MOUTH 4 (FOUR) TIMES DAILY. 100 mL 0   omeprazole (PRILOSEC) 40 MG capsule TAKE 1 CAPSULE BY MOUTH DAILY 100 capsule 2   oxyCODONE (OXY IR/ROXICODONE) 5 MG immediate release tablet Take 1 tablet (5 mg total) by mouth every 6 (six) hours as needed for severe pain (pain score 7-10). 15 tablet 0   potassium chloride (KLOR-CON) 10 MEQ tablet Take 2 tablets (20 mEq total) by mouth daily. 180 tablet 1   spironolactone (ALDACTONE) 25 MG tablet Take 1 tablet (25 mg total) by mouth daily. 90 tablet 3   tiotropium (SPIRIVA HANDIHALER) 18 MCG inhalation capsule Place 1 capsule (18 mcg total) into inhaler and inhale daily. 30 capsule 12   Vitamin D, Ergocalciferol, (DRISDOL) 1.25 MG (50000 UNIT) CAPS capsule Take 1 capsule (50,000 Units total) by mouth every 7 (seven) days. 12 capsule 2   No current facility-administered medications on file prior to visit.    Allergies  Allergen Reactions   Vicodin [Hydrocodone-Acetaminophen] Nausea And Vomiting and Other (See Comments)    Sweating   Bactrim [Sulfamethoxazole-Trimethoprim] Nausea And Vomiting    HA    Incruse Ellipta [Umeclidinium Bromide] Other (See Comments)    Dry mouth   Tramadol Nausea And Vomiting   Wellbutrin [Bupropion] Other (See Comments)    Made me feel bad and sleepy    Social History   Socioeconomic History   Marital status: Married    Spouse name: Not on file   Number of children: Not on file   Years of education: Not on file   Highest education level: Some college, no degree  Occupational History   Not on file  Tobacco Use   Smoking status: Former    Current packs/day: 0.00    Average packs/day: 0.3 packs/day for 40.0 years (10.0 ttl pk-yrs)    Types: Cigarettes    Start date: 02/08/1977    Quit date:  02/08/2017    Years since quitting: 6.3    Passive exposure: Never   Smokeless tobacco: Never  Vaping Use   Vaping status: Never Used  Substance and Sexual Activity   Alcohol use: No   Drug use: No   Sexual activity: Yes    Birth control/protection: Post-menopausal, Surgical    Comment: BTL  Other Topics Concern   Not on file  Social History Narrative   Not on file   Social Drivers of Health   Financial Resource Strain: Low Risk  (06/16/2023)   Overall Financial Resource Strain (CARDIA)    Difficulty of Paying Living Expenses: Not hard at all  Food Insecurity: No Food Insecurity (06/16/2023)   Hunger Vital Sign    Worried About Running Out of Food in the Last Year: Never true    Ran Out of Food in the Last Year: Never true  Transportation Needs: No  Transportation Needs (06/16/2023)   PRAPARE - Administrator, Civil Service (Medical): No    Lack of Transportation (Non-Medical): No  Physical Activity: Inactive (06/16/2023)   Exercise Vital Sign    Days of Exercise per Week: 0 days    Minutes of Exercise per Session: 30 min  Stress: No Stress Concern Present (06/16/2023)   [employer] of Occupational Health - Occupational Stress Questionnaire    Feeling of Stress : Not at all  Social Connections: Socially Integrated (06/16/2023)   Social Connection and Isolation Panel [NHANES]    Frequency of Communication with Friends and Family: More than three times a week    Frequency of Social Gatherings with Friends and Family: Twice a week    Attends Religious Services: More than 4 times per year    Active Member of [employer] or Organizations: Yes    Attends Banker Meetings: Patient declined    Marital Status: Married  Catering manager Violence: Not At Risk (05/20/2023)   Humiliation, Afraid, Rape, and Kick questionnaire    Fear of Current or Ex-Partner: No    Emotionally Abused: No    Physically Abused: No    Sexually Abused: No    Family History  Problem  Relation Age of Onset   Hyperlipidemia Mother    Hypertension Mother    Heart disease Mother        Atrial Fib.   Hyperlipidemia Sister    Hypertension Sister    Heart disease Sister        Before age 30,  CHF   Heart attack Brother    Hyperlipidemia Brother    Hypertension Brother    Heart disease Brother    Cancer Maternal Uncle        unknown type; mets   Gastric cancer Maternal Uncle    Prostate cancer Maternal Uncle        mets?   Gastric cancer Paternal Grandfather        dx >50   Hypertension Daughter    Breast cancer Cousin 1       maternal female cousin   Breast cancer Cousin        paternal female cousin; dx <50    Past Surgical History:  Procedure Laterality Date   APPENDECTOMY  1980   AXILLARY-FEMORAL BYPASS GRAFT Bilateral 10/29/2021   Procedure: LEFT AXILLOBIFEMORAL BYPASS GRAFT;  Surgeon: Chuck Hint, MD;  Location: [facility] OR;  Service: Vascular;  Laterality: Bilateral;   BREAST BIOPSY Left 05/12/2023   [place] LT BREAST BX W LOC DEV 1ST LESION IMG BX SPEC US GUIDE 05/12/2023 [facility] MAMMOGRAPHY   BREAST BIOPSY  06/09/2023   MM LT RADIOACTIVE SEED LOC MAMMO GUIDE 06/09/2023 [facility] MAMMOGRAPHY   BREAST LUMPECTOMY Left    s/p radiation therapy    BREAST LUMPECTOMY WITH RADIOACTIVE SEED LOCALIZATION Left 06/11/2023   Procedure: BREAST LUMPECTOMY WITH RADIOACTIVE SEED LOCALIZATION;  Surgeon: Harriette Bouillon, MD;  Location: [facility];  Service: General;  Laterality: Left;  LEFT BREAST SEED LUMPECTOMY   BREAST SURGERY     COLONOSCOPY  07/2015   COLONOSCOPY WITH PROPOFOL N/A 06/26/2015   Procedure: COLONOSCOPY WITH PROPOFOL;  Surgeon: Charolett Bumpers, MD;  Location: [facility];  Service: Endoscopy;  Laterality: N/A;   CT CTA CORONARY W/CA SCORE W/CM &/OR WO/CM  12/10/2017   Coronary calcium score is 0.  No evidence of CAD.   PERIPHERAL VASCULAR CATHETERIZATION N/A 10/22/2015   Procedure: Abdominal Aortogram w/Lower Extremity;  Surgeon: Chuck Hint, MD;  Location: [facility] INVASIVE CV LAB: Aortic arch widely patent as far as innominate artery, right subclavian artery, right vertebral artery and right, common carotid.  Bilateral single renal arteries - Nonocclusive disease. Infrarenal aorta 100% - bilateral common Iliac 100% --> B Ex Iliacs fill via Int Iliac. Leg OK   TRANSTHORACIC ECHOCARDIOGRAM  08/2017   EF 60-65%. No RWMA.  Normal DF.  Normal valves.    TUBAL LIGATION  1991    ROS: Review of Systems Negative except as stated above  PHYSICAL EXAM: BP (!) 149/83 (BP Location: Right Arm, Patient Position: Sitting, Cuff Size: Large)   Pulse 62   Ht 5\' 5"  (1.651 m)   Wt 204 lb (92.5 kg)   SpO2 99%   BMI 33.95 kg/m   Physical Exam  {female adult master:310786} {female adult master:310785}     Latest Ref Rng & Units 05/20/2023   11:24 AM 12/25/2022    9:55 AM 10/30/2021    5:30 AM  CMP  Glucose 70 - 99 mg/dL 99  93  161   BUN 8 - 23 mg/dL 13  12  10    Creatinine 0.44 - 1.00 mg/dL 0.96  0.45  4.09   Sodium 135 - 145 mmol/L 139  144  137   Potassium 3.5 - 5.1 mmol/L 4.5  4.2  3.7   Chloride 98 - 111 mmol/L 106  104  100   CO2 22 - 32 mmol/L 30  26  24    Calcium 8.9 - 10.3 mg/dL 9.8  9.3  9.0   Total Protein 6.5 - 8.1 g/dL 7.9  7.3    Total Bilirubin 0.0 - 1.2 mg/dL 0.5  0.3    Alkaline Phos 38 - 126 U/L 78  111    AST 15 - 41 U/L 22  33    ALT 0 - 44 U/L 33  46     Lipid Panel     Component Value Date/Time   CHOL 146 12/25/2022 0955   TRIG 77 12/25/2022 0955   HDL 69 12/25/2022 0955   CHOLHDL 2.1 12/25/2022 0955   CHOLHDL 2.2 10/30/2021 0530   VLDL 10 10/30/2021 0530   LDLCALC 62 12/25/2022 0955    CBC    Component Value Date/Time   WBC 4.2 05/20/2023 1124   WBC 8.2 11/01/2021 1206   RBC 3.85 (L) 05/20/2023 1124   HGB 12.4 05/20/2023 1124   HGB 11.1 12/25/2022 0955   HGB 13.8 06/12/2008 0939   HCT 38.0 05/20/2023 1124   HCT 35.3 12/25/2022 0955   HCT 40.2 06/12/2008 0939   PLT 254 05/20/2023 1124    PLT 256 12/25/2022 0955   MCV 98.7 05/20/2023 1124   MCV 98 (H) 12/25/2022 0955   MCV 90.1 06/12/2008 0939   MCH 32.2 05/20/2023 1124   MCHC 32.6 05/20/2023 1124   RDW 13.2 05/20/2023 1124   RDW 12.7 12/25/2022 0955   RDW 13.9 06/12/2008 0939   LYMPHSABS 1.9 05/20/2023 1124   LYMPHSABS 0.4 (L) 03/10/2017 1335   LYMPHSABS 1.5 06/12/2008 0939   MONOABS 0.7 05/20/2023 1124   MONOABS 0.6 06/12/2008 0939   EOSABS 0.1 05/20/2023 1124   EOSABS 0.1 03/10/2017 1335   BASOSABS 0.1 05/20/2023 1124   BASOSABS 0.0 03/10/2017 1335   BASOSABS 0.0 06/12/2008 0939    ASSESSMENT AND PLAN:  Assessment and Plan Assessment & Plan      1. Prediabetes (Primary) *** - POCT glycosylated hemoglobin (Hb A1C) -  POCT glucose (manual entry)    Patient was given the opportunity to ask questions.  Patient verbalized understanding of the plan and was able to repeat key elements of the plan.   This documentation was completed using Paediatric nurse.  Any transcriptional errors are unintentional.  Orders Placed This Encounter  Procedures   POCT glycosylated hemoglobin (Hb A1C)   POCT glucose (manual entry)     Requested Prescriptions   Pending Prescriptions Disp Refills   nystatin (MYCOSTATIN) 100000 UNIT/ML suspension 100 mL 0    Sig: SWISH AND SWALLOW 5 MLS (500,000 UNITS TOTAL) BY MOUTH 4 (FOUR) TIMES DAILY.    No follow-ups on file.  Concetta Dee, MD, FACP

## 2023-06-17 ENCOUNTER — Encounter: Payer: Self-pay | Admitting: Internal Medicine

## 2023-06-17 ENCOUNTER — Other Ambulatory Visit: Payer: Self-pay | Admitting: Internal Medicine

## 2023-06-17 ENCOUNTER — Encounter: Payer: Self-pay | Admitting: *Deleted

## 2023-06-17 DIAGNOSIS — Z760 Encounter for issue of repeat prescription: Secondary | ICD-10-CM

## 2023-06-19 ENCOUNTER — Other Ambulatory Visit: Payer: Self-pay

## 2023-06-19 ENCOUNTER — Other Ambulatory Visit: Payer: Self-pay | Admitting: Internal Medicine

## 2023-06-19 DIAGNOSIS — J449 Chronic obstructive pulmonary disease, unspecified: Secondary | ICD-10-CM

## 2023-06-19 MED ORDER — TIOTROPIUM BROMIDE MONOHYDRATE 18 MCG IN CAPS
18.0000 ug | ORAL_CAPSULE | Freq: Every day | RESPIRATORY_TRACT | 6 refills | Status: AC
  Filled 2023-06-19: qty 30, 30d supply, fill #0
  Filled 2023-07-23: qty 30, 30d supply, fill #1
  Filled 2023-08-20: qty 30, 30d supply, fill #2
  Filled 2023-09-30: qty 30, 30d supply, fill #3
  Filled 2023-10-29: qty 30, 30d supply, fill #4
  Filled 2023-11-25: qty 30, 30d supply, fill #5

## 2023-06-22 ENCOUNTER — Encounter: Payer: Self-pay | Admitting: Surgery

## 2023-06-23 ENCOUNTER — Other Ambulatory Visit: Payer: Self-pay

## 2023-06-24 ENCOUNTER — Other Ambulatory Visit: Payer: Self-pay

## 2023-06-26 ENCOUNTER — Inpatient Hospital Stay: Attending: Hematology and Oncology | Admitting: Hematology and Oncology

## 2023-06-26 ENCOUNTER — Other Ambulatory Visit: Payer: Self-pay

## 2023-06-26 VITALS — BP 138/83 | HR 59 | Temp 98.1°F | Resp 18 | Wt 203.2 lb

## 2023-06-26 DIAGNOSIS — M8580 Other specified disorders of bone density and structure, unspecified site: Secondary | ICD-10-CM | POA: Diagnosis not present

## 2023-06-26 DIAGNOSIS — Z170 Estrogen receptor positive status [ER+]: Secondary | ICD-10-CM | POA: Insufficient documentation

## 2023-06-26 DIAGNOSIS — Z803 Family history of malignant neoplasm of breast: Secondary | ICD-10-CM | POA: Insufficient documentation

## 2023-06-26 DIAGNOSIS — Z79899 Other long term (current) drug therapy: Secondary | ICD-10-CM | POA: Insufficient documentation

## 2023-06-26 DIAGNOSIS — Z87891 Personal history of nicotine dependence: Secondary | ICD-10-CM | POA: Insufficient documentation

## 2023-06-26 DIAGNOSIS — Z809 Family history of malignant neoplasm, unspecified: Secondary | ICD-10-CM | POA: Insufficient documentation

## 2023-06-26 DIAGNOSIS — D0512 Intraductal carcinoma in situ of left breast: Secondary | ICD-10-CM | POA: Insufficient documentation

## 2023-06-26 DIAGNOSIS — Z79811 Long term (current) use of aromatase inhibitors: Secondary | ICD-10-CM | POA: Insufficient documentation

## 2023-06-26 DIAGNOSIS — Z8042 Family history of malignant neoplasm of prostate: Secondary | ICD-10-CM | POA: Insufficient documentation

## 2023-06-26 DIAGNOSIS — Z800 Family history of malignant neoplasm of digestive organs: Secondary | ICD-10-CM | POA: Insufficient documentation

## 2023-06-26 MED ORDER — ANASTROZOLE 1 MG PO TABS
1.0000 mg | ORAL_TABLET | Freq: Every day | ORAL | 3 refills | Status: AC
  Filled 2023-06-26: qty 30, 30d supply, fill #0
  Filled 2023-07-23: qty 30, 30d supply, fill #1
  Filled 2023-08-20: qty 30, 30d supply, fill #2
  Filled 2023-09-30: qty 30, 30d supply, fill #3
  Filled 2023-10-29: qty 30, 30d supply, fill #4
  Filled 2023-11-25: qty 30, 30d supply, fill #5
  Filled 2023-12-23: qty 30, 30d supply, fill #6
  Filled 2024-01-24: qty 30, 30d supply, fill #7
  Filled 2024-02-18: qty 30, 30d supply, fill #8
  Filled 2024-03-28: qty 30, 30d supply, fill #9

## 2023-06-26 NOTE — Assessment & Plan Note (Addendum)
 Ductal Carcinoma In Situ (DCIS) Recurrent noninvasive breast cancer, ER positive, PR negative.   Assessment and Plan Assessment & Plan Stage 0 breast cancer Stage 0 breast cancer, estrogen receptor-positive, surgically removed. Anastrozole  recommended for hormonal therapy due to postmenopausal status. Discussed side effects including but not limited to hot flashes, vaginal dryness, arthralgias, bone density loss, cardiovascular events and need for  bone density monitoring. -. She agreed to start anastrozole . - Prescribe anastrozole  1 mg daily for 5 years. - Monitor bone density every 2 years. - Send prescription to [facility] - Advise her to report any intolerable side effects.  Osteopenia Osteopenia noted on 2024 bone density scan. Emphasized bone health maintenance, especially with anastrozole  use. - Encourage walking at least 30 minutes a day. - Continue vitamin D  supplementation.    Murleen Arms MD

## 2023-06-26 NOTE — Progress Notes (Signed)
 [facility] CONSULT NOTE  Patient Care Team: Lawrance Presume, MD as PCP - General (Internal Medicine) Arleen Lacer, MD as PCP - Cardiology (Cardiology) Auther Bo, RN as Oncology Nurse Navigator Alane Hsu, RN as Oncology Nurse Navigator Sim Dryer, MD as Consulting Physician (General Surgery) Murleen Arms, MD as Consulting Physician (Hematology and Oncology) Retta Caster, MD as Consulting Physician (Radiation Oncology)  CHIEF COMPLAINTS/PURPOSE OF CONSULTATION:  Newly diagnosed breast cancer  HISTORY OF PRESENTING ILLNESS:  Brandy Stevenson 68 y.o. female is here because of recent diagnosis of left breast cancer  I reviewed her records extensively and collaborated the history with the patient.  SUMMARY OF ONCOLOGIC HISTORY: Oncology History  Ductal carcinoma in situ (DCIS) of left breast  04/09/2023 Mammogram   In the left breast, a possible mass warrants further evaluation. In the right breast, no findings suspicious for malignancy. Suspicious 8 mm mass in the left breast at 2 o'clock, 5 cm the nipple. Tissue sampling is indicated. Single left axillary lymph node, deep, difficult to visualize and near the axillary artery, with eccentric cortical thickening.   05/18/2023 Initial Diagnosis   Ductal carcinoma in situ (DCIS) of left breast   05/20/2023 Pathology Results   . Breast, left, needle core biopsy, 2 o'clock, 5cmfn, coil clip :       DUCTAL CARCINOMA IN SITU, HIGH NUCLEAR GRADE       NECROSIS: NOT IDENTIFIED       CALCIFICATIONS: NOT IDENTIFIED       DCIS LENGTH: 0.6 CM        Estrogen Receptor:  100%, POSITIVE, STRONG STAINING INTENSITY  Progesterone Receptor:  0%, NEGATIVE  COMMENT:  The negative hormone receptor study(ies) in this case has an internal positive control       Brandy Stevenson is a 68 year old female with ductal carcinoma in situ (DCIS) who presents for oncology consultation regarding breast cancer  treatment options.  Initially diagnosed with ductal carcinoma in situ (DCIS) in 2009, she underwent a lumpectomy and radiation therapy. Hormone therapy was offered but declined due to her smoking habit, as she was advised against taking tamoxifen while smoking due to the increased risk of blood clots. She didn't want a mastectomy. After much discussion, we agreed to proceed with lumpectomy and anti estrogen therapy.  Discussed the use of AI scribe software for clinical note transcription with the patient, who gave verbal consent to proceed.  History of Present Illness  She underwent surgery on April 10th, which successfully removed all cancerous tissue. This is her second occurrence of breast cancer, with the first being in 2009 in the same breast, treated with radiation therapy. The current cancer is estrogen receptor-positive.  She is experiencing challenges with the surgical scar, noting that an adhesive patch placed over the scar and drainage tube is difficult to remove without pulling the skin. She has been soaking it with water  to ease removal. No issues with drainage or infection at the surgical site. No fever or other systemic symptoms.  She recalls a bone density scan in 2024 that showed osteopenia. She continues to take vitamin D  supplements.  MEDICAL HISTORY:  Past Medical History:  Diagnosis Date   Arthritis    back problems to be evaluated by neurology next month   Cancer [facility]) 2009   L, Breast, radiation and surgery.   COPD (chronic obstructive pulmonary disease) (HCC)    Depression    GERD (gastroesophageal reflux disease)  Hyperlipemia    Hypertension    Leriche syndrome (HCC)    Infrarenal aortic occlusion extending to bilateral internal iliac arteries.  External iliacs fill via collaterals from lumbar arteries to hypogastric/internal iliac arteries -minimal disease on the right side and no disease on the left side leg arteries distally.   Peripheral arterial disease  (HCC)    Peripheral vascular disease (HCC) 2013   Pre-diabetes     SURGICAL HISTORY: Past Surgical History:  Procedure Laterality Date   APPENDECTOMY  1980   AXILLARY-FEMORAL BYPASS GRAFT Bilateral 10/29/2021   Procedure: LEFT AXILLOBIFEMORAL BYPASS GRAFT;  Surgeon: Dannis Dy, MD;  Location: [facility] OR;  Service: Vascular;  Laterality: Bilateral;   BREAST BIOPSY Left 05/12/2023   US  LT BREAST BX W LOC DEV 1ST LESION IMG BX SPEC US  GUIDE 05/12/2023 [facility] MAMMOGRAPHY   BREAST BIOPSY  06/09/2023   MM LT RADIOACTIVE SEED LOC MAMMO GUIDE 06/09/2023 [facility] MAMMOGRAPHY   BREAST LUMPECTOMY Left    s/p radiation therapy    BREAST LUMPECTOMY WITH RADIOACTIVE SEED LOCALIZATION Left 06/11/2023   Procedure: BREAST LUMPECTOMY WITH RADIOACTIVE SEED LOCALIZATION;  Surgeon: Sim Dryer, MD;  Location: [facility];  Service: General;  Laterality: Left;  LEFT BREAST SEED LUMPECTOMY   BREAST SURGERY     COLONOSCOPY  07/2015   COLONOSCOPY WITH PROPOFOL  N/A 06/26/2015   Procedure: COLONOSCOPY WITH PROPOFOL ;  Surgeon: Garrett Kallman, MD;  Location: [facility];  Service: Endoscopy;  Laterality: N/A;   CT CTA CORONARY W/CA SCORE W/CM &/OR WO/CM  12/10/2017   Coronary calcium  score is 0.  No evidence of CAD.   PERIPHERAL VASCULAR CATHETERIZATION N/A 10/22/2015   Procedure: Abdominal Aortogram w/Lower Extremity;  Surgeon: Dannis Dy, MD;  Location: [facility] INVASIVE CV LAB: Aortic arch widely patent as far as innominate artery, right subclavian artery, right vertebral artery and right, common carotid.  Bilateral single renal arteries - Nonocclusive disease. Infrarenal aorta 100% - bilateral common Iliac 100% --> B Ex Iliacs fill via Int Iliac. Leg OK   TRANSTHORACIC ECHOCARDIOGRAM  08/2017   EF 60-65%. No RWMA.  Normal DF.  Normal valves.    TUBAL LIGATION  1991    SOCIAL HISTORY: Social History   Socioeconomic History   Marital status: Married    Spouse name: Not on file    Number of children: Not on file   Years of education: Not on file   Highest education level: Some college, no degree  Occupational History   Not on file  Tobacco Use   Smoking status: Former    Current packs/day: 0.00    Average packs/day: 0.3 packs/day for 40.0 years (10.0 ttl pk-yrs)    Types: Cigarettes    Start date: 02/08/1977    Quit date: 02/08/2017    Years since quitting: 6.3    Passive exposure: Never   Smokeless tobacco: Never  Vaping Use   Vaping status: Never Used  Substance and Sexual Activity   Alcohol use: No   Drug use: No   Sexual activity: Yes    Birth control/protection: Post-menopausal, Surgical    Comment: BTL  Other Topics Concern   Not on file  Social History Narrative   Not on file   Social Drivers of Health   Financial Resource Strain: Low Risk  (06/16/2023)   Overall Financial Resource Strain (CARDIA)    Difficulty of Paying Living Expenses: Not hard at all  Food Insecurity: No Food Insecurity (06/16/2023)   Hunger Vital Sign  Worried About Programme researcher, broadcasting/film/video in the Last Year: Never true    Ran Out of Food in the Last Year: Never true  Transportation Needs: No Transportation Needs (06/16/2023)   PRAPARE - Administrator, Civil Service (Medical): No    Lack of Transportation (Non-Medical): No  Physical Activity: Inactive (06/16/2023)   Exercise Vital Sign    Days of Exercise per Week: 0 days    Minutes of Exercise per Session: 30 min  Stress: No Stress Concern Present (06/16/2023)   [employer] of Occupational Health - Occupational Stress Questionnaire    Feeling of Stress : Not at all  Social Connections: Socially Integrated (06/16/2023)   Social Connection and Isolation Panel [NHANES]    Frequency of Communication with Friends and Family: More than three times a week    Frequency of Social Gatherings with Friends and Family: Twice a week    Attends Religious Services: More than 4 times per year    Active Member of [employer] or  Organizations: Yes    Attends Banker Meetings: Patient declined    Marital Status: Married  Catering manager Violence: Not At Risk (05/20/2023)   Humiliation, Afraid, Rape, and Kick questionnaire    Fear of Current or Ex-Partner: No    Emotionally Abused: No    Physically Abused: No    Sexually Abused: No    FAMILY HISTORY: Family History  Problem Relation Age of Onset   Hyperlipidemia Mother    Hypertension Mother    Heart disease Mother        Atrial Fib.   Hyperlipidemia Sister    Hypertension Sister    Heart disease Sister        Before age 10,  CHF   Heart attack Brother    Hyperlipidemia Brother    Hypertension Brother    Heart disease Brother    Cancer Maternal Uncle        unknown type; mets   Gastric cancer Maternal Uncle    Prostate cancer Maternal Uncle        mets?   Gastric cancer Paternal Grandfather        dx >50   Hypertension Daughter    Breast cancer Cousin 49       maternal female cousin   Breast cancer Cousin        paternal female cousin; dx <50    ALLERGIES:  is allergic to vicodin [hydrocodone -acetaminophen ], bactrim  [sulfamethoxazole -trimethoprim ], incruse ellipta  [umeclidinium bromide ], tramadol , and wellbutrin  [bupropion ].  MEDICATIONS:  Current Outpatient Medications  Medication Sig Dispense Refill   anastrozole  (ARIMIDEX ) 1 MG tablet Take 1 tablet (1 mg total) by mouth daily. 90 tablet 3   acetaminophen  (TYLENOL ) 500 MG tablet Take 1,000 mg by mouth every 8 (eight) hours.     albuterol  (PROAIR  HFA) 108 (90 Base) MCG/ACT inhaler Inhale 2 puffs into the lungs every 6 (six) hours as needed for wheezing or shortness of breath. 6.7 g 2   amLODipine  (NORVASC ) 5 MG tablet Take 1 tablet (5 mg total) by mouth daily. 90 tablet 1   aspirin  EC 81 MG tablet Take 1 tablet (81 mg total) by mouth daily. 90 tablet 3   atorvastatin  (LIPITOR ) 80 MG tablet TAKE 1 TABLET BY MOUTH AT  BEDTIME 100 tablet 2   carvedilol  (COREG ) 6.25 MG tablet Take 1  tablet (6.25 mg total) by mouth 2 (two) times daily. 180 tablet 1   fluticasone -salmeterol (ADVAIR ) 250-50 MCG/ACT AEPB Inhale 1 puff into  the lungs in the morning and at bedtime. 120 each 2   gabapentin  (NEURONTIN ) 300 MG capsule TAKE 1 CAPSULE BY MOUTH AT  BEDTIME 100 capsule 2   hydrALAZINE  (APRESOLINE ) 25 MG tablet Take 1 tablet (25 mg total) by mouth 3 (three) times daily. 270 tablet 1   meloxicam  (MOBIC ) 15 MG tablet TAKE 1 TABLET BY MOUTH DAILY 100 tablet 0   Menthol, Topical Analgesic, (BIOFREEZE) 5 % PTCH Apply 0.5 patches topically daily as needed (pain).     nystatin  (MYCOSTATIN ) 100000 UNIT/ML suspension SWISH AND SWALLOW 5 MLS (500,000 UNITS TOTAL) BY MOUTH 4 (FOUR) TIMES DAILY. 100 mL 0   omeprazole  (PRILOSEC) 40 MG capsule TAKE 1 CAPSULE BY MOUTH DAILY 100 capsule 2   oxyCODONE  (OXY IR/ROXICODONE ) 5 MG immediate release tablet Take 1 tablet (5 mg total) by mouth every 6 (six) hours as needed for severe pain (pain score 7-10). 15 tablet 0   potassium chloride  (KLOR-CON ) 10 MEQ tablet Take 2 tablets (20 mEq total) by mouth daily. 180 tablet 1   spironolactone  (ALDACTONE ) 25 MG tablet Take 1 tablet (25 mg total) by mouth daily. 90 tablet 3   tiotropium (SPIRIVA  HANDIHALER) 18 MCG inhalation capsule Place 1 capsule (18 mcg total) into inhaler and inhale daily. 30 capsule 6   Vitamin D , Ergocalciferol , (DRISDOL ) 1.25 MG (50000 UNIT) CAPS capsule Take 1 capsule (50,000 Units total) by mouth every 7 (seven) days. 12 capsule 2   No current facility-administered medications for this visit.    REVIEW OF SYSTEMS:   Constitutional: Denies fevers, chills or abnormal night sweats Eyes: Denies blurriness of vision, double vision or watery eyes Ears, nose, mouth, throat, and face: Denies mucositis or sore throat Respiratory: Denies cough, dyspnea or wheezes Cardiovascular: Denies palpitation, chest discomfort or lower extremity swelling Gastrointestinal:  Denies nausea, heartburn or change in  bowel habits Skin: Denies abnormal skin rashes Lymphatics: Denies new lymphadenopathy or easy bruising Neurological:Denies numbness, tingling or new weaknesses Behavioral/Psych: Mood is stable, no new changes  Breast: Denies any palpable lumps or discharge All other systems were reviewed with the patient and are negative.  PHYSICAL EXAMINATION: ECOG PERFORMANCE STATUS: 0 - Asymptomatic  Vitals:   06/26/23 1204  BP: 138/83  Pulse: (!) 59  Resp: 18  Temp: 98.1 F (36.7 C)  SpO2: 98%   Filed Weights   06/26/23 1204  Weight: 203 lb 3 oz (92.2 kg)    GENERAL:alert, no distress and comfortable Breast appears to be healing well  LABORATORY DATA:  I have reviewed the data as listed Lab Results  Component Value Date   WBC 4.2 05/20/2023   HGB 12.4 05/20/2023   HCT 38.0 05/20/2023   MCV 98.7 05/20/2023   PLT 254 05/20/2023   Lab Results  Component Value Date   NA 139 05/20/2023   K 4.5 05/20/2023   CL 106 05/20/2023   CO2 30 05/20/2023    RADIOGRAPHIC STUDIES: I have personally reviewed the radiological reports and agreed with the findings in the report.  ASSESSMENT AND PLAN:  Ductal carcinoma in situ (DCIS) of left breast Ductal Carcinoma In Situ (DCIS) Recurrent noninvasive breast cancer, ER positive, PR negative.   Assessment and Plan Assessment & Plan Stage 0 breast cancer Stage 0 breast cancer, estrogen receptor-positive, surgically removed. Anastrozole  recommended for hormonal therapy due to postmenopausal status. Discussed side effects including but not limited to hot flashes, vaginal dryness, arthralgias, bone density loss, cardiovascular events and need for  bone density monitoring. -. She  agreed to start anastrozole . - Prescribe anastrozole  1 mg daily for 5 years. - Monitor bone density every 2 years. - Send prescription to [facility] - Advise her to report any intolerable side effects.  Osteopenia Osteopenia noted on 2024  bone density scan. Emphasized bone health maintenance, especially with anastrozole  use. - Encourage walking at least 30 minutes a day. - Continue vitamin D  supplementation.    Murleen Arms MD   All questions were answered. The patient knows to call the clinic with any problems, questions or concerns.    Murleen Arms, MD 06/26/23

## 2023-06-29 ENCOUNTER — Encounter: Payer: Self-pay | Admitting: *Deleted

## 2023-06-29 ENCOUNTER — Telehealth: Payer: Self-pay | Admitting: Hematology and Oncology

## 2023-06-29 DIAGNOSIS — D0512 Intraductal carcinoma in situ of left breast: Secondary | ICD-10-CM

## 2023-06-29 NOTE — Telephone Encounter (Signed)
 Spoke with patient confirming upcoming appointment

## 2023-07-02 ENCOUNTER — Other Ambulatory Visit: Payer: Self-pay | Admitting: Internal Medicine

## 2023-07-02 DIAGNOSIS — I10 Essential (primary) hypertension: Secondary | ICD-10-CM

## 2023-07-03 ENCOUNTER — Other Ambulatory Visit: Payer: Self-pay

## 2023-07-03 MED ORDER — HYDRALAZINE HCL 25 MG PO TABS
25.0000 mg | ORAL_TABLET | Freq: Three times a day (TID) | ORAL | 1 refills | Status: DC
  Filled 2023-07-03: qty 270, 90d supply, fill #0
  Filled 2023-10-01: qty 270, 90d supply, fill #1

## 2023-07-03 MED ORDER — POTASSIUM CHLORIDE ER 10 MEQ PO TBCR
20.0000 meq | EXTENDED_RELEASE_TABLET | Freq: Every day | ORAL | 1 refills | Status: DC
  Filled 2023-07-03: qty 180, 90d supply, fill #0
  Filled 2023-09-30: qty 180, 90d supply, fill #1

## 2023-07-06 ENCOUNTER — Other Ambulatory Visit: Payer: Self-pay

## 2023-07-14 ENCOUNTER — Ambulatory Visit: Admitting: Pharmacist

## 2023-07-17 ENCOUNTER — Other Ambulatory Visit: Payer: Self-pay

## 2023-07-23 ENCOUNTER — Encounter: Payer: Self-pay | Admitting: Cardiology

## 2023-07-24 ENCOUNTER — Other Ambulatory Visit: Payer: Self-pay

## 2023-08-11 DIAGNOSIS — C50912 Malignant neoplasm of unspecified site of left female breast: Secondary | ICD-10-CM | POA: Diagnosis not present

## 2023-08-13 ENCOUNTER — Ambulatory Visit: Admitting: Pharmacist

## 2023-08-13 ENCOUNTER — Other Ambulatory Visit: Payer: Self-pay | Admitting: Internal Medicine

## 2023-08-13 ENCOUNTER — Other Ambulatory Visit: Payer: Self-pay

## 2023-08-13 DIAGNOSIS — J449 Chronic obstructive pulmonary disease, unspecified: Secondary | ICD-10-CM

## 2023-08-13 MED ORDER — FLUTICASONE-SALMETEROL 250-50 MCG/ACT IN AEPB
1.0000 | INHALATION_SPRAY | Freq: Two times a day (BID) | RESPIRATORY_TRACT | 2 refills | Status: DC
  Filled 2023-08-13 – 2023-09-08 (×2): qty 60, 30d supply, fill #0
  Filled 2023-10-23: qty 60, 30d supply, fill #1
  Filled 2023-11-25: qty 60, 30d supply, fill #2

## 2023-08-20 ENCOUNTER — Other Ambulatory Visit: Payer: Self-pay | Admitting: Internal Medicine

## 2023-08-20 ENCOUNTER — Other Ambulatory Visit: Payer: Self-pay

## 2023-08-21 ENCOUNTER — Other Ambulatory Visit: Payer: Self-pay

## 2023-08-24 ENCOUNTER — Other Ambulatory Visit: Payer: Self-pay

## 2023-08-25 ENCOUNTER — Other Ambulatory Visit: Payer: Self-pay

## 2023-09-08 ENCOUNTER — Other Ambulatory Visit: Payer: Self-pay

## 2023-09-08 ENCOUNTER — Other Ambulatory Visit: Payer: Self-pay | Admitting: Internal Medicine

## 2023-09-08 DIAGNOSIS — I10 Essential (primary) hypertension: Secondary | ICD-10-CM

## 2023-09-08 MED ORDER — AMLODIPINE BESYLATE 5 MG PO TABS
5.0000 mg | ORAL_TABLET | Freq: Every day | ORAL | 1 refills | Status: DC
  Filled 2023-09-08: qty 90, 90d supply, fill #0

## 2023-09-08 MED ORDER — CARVEDILOL 6.25 MG PO TABS
6.2500 mg | ORAL_TABLET | Freq: Two times a day (BID) | ORAL | 1 refills | Status: DC
Start: 2023-09-08 — End: 2024-01-22
  Filled 2023-09-08: qty 180, 90d supply, fill #0
  Filled 2023-12-01: qty 180, 90d supply, fill #1

## 2023-09-09 ENCOUNTER — Other Ambulatory Visit: Payer: Self-pay

## 2023-09-10 ENCOUNTER — Encounter (HOSPITAL_COMMUNITY): Payer: 59

## 2023-09-10 ENCOUNTER — Other Ambulatory Visit: Payer: Self-pay

## 2023-09-10 ENCOUNTER — Ambulatory Visit: Payer: 59

## 2023-09-14 DIAGNOSIS — I890 Lymphedema, not elsewhere classified: Secondary | ICD-10-CM | POA: Diagnosis not present

## 2023-09-16 ENCOUNTER — Other Ambulatory Visit: Payer: Self-pay | Admitting: Internal Medicine

## 2023-09-16 DIAGNOSIS — E785 Hyperlipidemia, unspecified: Secondary | ICD-10-CM

## 2023-09-25 ENCOUNTER — Telehealth: Payer: Self-pay | Admitting: Internal Medicine

## 2023-09-25 NOTE — Telephone Encounter (Signed)
 Called pt to confirm appt LVM

## 2023-09-28 ENCOUNTER — Other Ambulatory Visit: Payer: Self-pay

## 2023-09-28 ENCOUNTER — Ambulatory Visit: Attending: Internal Medicine | Admitting: Internal Medicine

## 2023-09-28 ENCOUNTER — Encounter: Payer: Self-pay | Admitting: Internal Medicine

## 2023-09-28 VITALS — BP 154/83 | HR 64 | Temp 97.7°F | Ht 65.0 in | Wt 201.0 lb

## 2023-09-28 DIAGNOSIS — J449 Chronic obstructive pulmonary disease, unspecified: Secondary | ICD-10-CM

## 2023-09-28 DIAGNOSIS — I10 Essential (primary) hypertension: Secondary | ICD-10-CM

## 2023-09-28 DIAGNOSIS — E559 Vitamin D deficiency, unspecified: Secondary | ICD-10-CM | POA: Diagnosis not present

## 2023-09-28 DIAGNOSIS — D0512 Intraductal carcinoma in situ of left breast: Secondary | ICD-10-CM | POA: Diagnosis not present

## 2023-09-28 DIAGNOSIS — Z6833 Body mass index (BMI) 33.0-33.9, adult: Secondary | ICD-10-CM

## 2023-09-28 DIAGNOSIS — E782 Mixed hyperlipidemia: Secondary | ICD-10-CM

## 2023-09-28 DIAGNOSIS — E66811 Obesity, class 1: Secondary | ICD-10-CM

## 2023-09-28 DIAGNOSIS — M79602 Pain in left arm: Secondary | ICD-10-CM

## 2023-09-28 DIAGNOSIS — E669 Obesity, unspecified: Secondary | ICD-10-CM

## 2023-09-28 MED ORDER — AMLODIPINE BESYLATE 10 MG PO TABS
10.0000 mg | ORAL_TABLET | Freq: Every day | ORAL | 1 refills | Status: DC
  Filled 2023-09-28 – 2023-09-30 (×2): qty 90, 90d supply, fill #0

## 2023-09-28 MED ORDER — SPIRONOLACTONE 25 MG PO TABS
25.0000 mg | ORAL_TABLET | Freq: Every day | ORAL | 3 refills | Status: DC
  Filled 2023-09-28: qty 90, 90d supply, fill #0

## 2023-09-28 NOTE — Patient Instructions (Addendum)
 Call [facility] to schedule an appointment for a CT of the neck  Phone number: [phone] Address: [address] [place], [place] [address]  VISIT SUMMARY:  Today, we discussed your ongoing issues with blood pressure management and post-surgical healing following your recent lumpectomy. We also reviewed your concerns about left arm pain, weight management, and vitamin D  supplementation.  YOUR PLAN:  -POST-SURGICAL WOUND HEALING COMPLICATION: You are experiencing delayed healing and persistent drainage from your lumpectomy site, likely due to prior radiation damage. We will delay lymphedema therapy until the drainage resolves. Please discuss with your surgeon about using a compression sleeve for managing lymphedema.  -LYMPHEDEMA: Lymphedema is swelling that generally occurs in one of your arms or legs, often due to the removal of or damage to your lymph nodes as part of cancer treatment. Your left arm pain is likely related to lymphedema. We recommend using your compression sleeve to manage the swelling and consulting with your oncologist about its use.  -HYPERTENSION: Hypertension is high blood pressure. Your blood pressure was elevated today at 154/83 mmHg. We will increase your amlodipine  dose to 10 mg daily and send the updated prescription to your pharmacy. Please resume regular home blood pressure monitoring.  -PREDIABETES: Prediabetes means your blood sugar levels are higher than normal but not yet high enough to be diagnosed as diabetes. Reducing your intake of sugary drinks like ginger ale can help manage this condition. We advise you to reduce your ginger ale consumption to one per day.  -VITAMIN D  DEFICIENCY: Vitamin D  deficiency means you have lower than normal levels of vitamin D , which is important for bone health. We will order a vitamin D  level test to determine if you need to continue high-dose supplementation. We will send the results via MyChart.  -GENERAL HEALTH  MAINTENANCE: You are compliant with your atorvastatin  medication and mindful of your diet, limiting salt and sugary snacks. Please continue taking atorvastatin  as prescribed.  INSTRUCTIONS:  Please follow up with your oncologist, Dr. Marrie, as scheduled. Ensure you receive your lab results and medication refills as discussed.

## 2023-09-28 NOTE — Progress Notes (Signed)
 Patient ID: Brandy Stevenson, female    DOB: 25-Feb-1956  MRN: [id number]  CC: Hypertension (HTN f/u. Thompson if vit D is still needed /)   Subjective: Brandy Stevenson is a 68 y.o. female who presents for chronic ds management. Her concerns today include:  Hx of PAD (axillobifemoral bypass graft BL 10/2021 ), HTN, HL, former tob dep, LT breast CA (lumpectomy and XRT 2009), GERD, COPD, Trigeminal neuralgia RT, dep/anxiety, OA shoulders   Discussed the use of AI scribe software for clinical note transcription with the patient, who gave verbal consent to proceed.  History of Present Illness Brandy Stevenson is a 69 year old female with hypertension and breast cancer who presents for follow-up on blood pressure management and post-surgical healing concerns.  She is experiencing ongoing issues with healing following a lumpectomy on her left breast performed 06/2023. There is persistent drainage from the site. Initially, a drain was placed for two weeks post-surgery, but after its removal, a tear at the incision site was closed with surgical glue. a new hole has formed above the original site. She is unable to undergo further radiation due to prior damage and did not require chemotherapy. She reports that she is currently on anastrozole  and will be taking it for five years.  She reports significant pain in her left arm, which she attributes to the anastrozole . The pain is constant and severe, affecting her daily activities and sleep. She has not started lymphedema therapy due to ongoing drainage and healing issues but has a sleeve from previous treatment that she plans to try using again. Has appt with Dr. Loretha tomorrow.  HTN: Her blood pressure management includes amlodipine , hydralazine , carvedilol , and spironolactone . She has not been checking her blood pressure at home recently due to stress and travel related to her mother's health. She has been compliant with her medication  regimen, taking amlodipine  at night along with atorvastatin , hydralazine , and carvedilol . She is also mindful of her salt intake.  COPD: She has a history of COPD and uses her inhalers daily Spiriva  and Advair . No recent exacerbations.  Prediabetes/obesity: She is concerned about her weight, noting minimal change despite dietary efforts. She consumes one meal per day and limits snacks and sugary drinks, although she drinks two ginger ales daily. She is aware of her borderline prediabetes status and is willing to reduce her ginger ale intake.  Vitamin D  deficiency: She has been out of vitamin D  for two weeks and is unsure if she should continue supplementation. She was previously on a high-dose regimen once a week.    Patient Active Problem List   Diagnosis Date Noted   Ductal carcinoma in situ (DCIS) of left breast 05/18/2023   Osteopenia of femoral neck 10/22/2022   Prediabetes 06/15/2021   Influenza vaccine refused 02/03/2020   Resistant hypertension 01/20/2020   Tubular adenoma of colon 04/27/2019   Cervical radiculopathy 10/01/2018   Hypertensive retinopathy 09/08/2018   Seasonal allergies 06/29/2018   Aortoiliac occlusive disease (HCC) 11/04/2017   DOE (dyspnea on exertion) 11/03/2017   Chronic chest wall pain 11/03/2017   Ichthyosis 10/06/2017   Former smoker 04/07/2017   Primary osteoarthritis of right shoulder 04/07/2017   Chronic midline low back pain without sciatica 04/07/2017   Hiatal hernia 05/01/2016   DJD (degenerative joint disease) of cervical spine 04/17/2016   History of breast cancer 02/18/2016   Foot callus 02/18/2016   Anxiety and depression 05/22/2015   Insomnia 03/15/2015   Vitamin D  deficiency 04/08/2013  Hyperlipidemia with target LDL less than 70 04/08/2013   Atherosclerotic PVD with intermittent claudication (HCC) 03/22/2013   Accelerated hypertension 02/23/2013   GERD (gastroesophageal reflux disease) 02/23/2013   COPD (chronic obstructive  pulmonary disease) (HCC) 02/23/2013     Current Outpatient Medications on File Prior to Visit  Medication Sig Dispense Refill   acetaminophen  (TYLENOL ) 500 MG tablet Take 1,000 mg by mouth every 8 (eight) hours.     albuterol  (PROAIR  HFA) 108 (90 Base) MCG/ACT inhaler Inhale 2 puffs into the lungs every 6 (six) hours as needed for wheezing or shortness of breath. 6.7 g 2   anastrozole  (ARIMIDEX ) 1 MG tablet Take 1 tablet (1 mg total) by mouth daily. 90 tablet 3   aspirin  EC 81 MG tablet Take 1 tablet (81 mg total) by mouth daily. 90 tablet 3   atorvastatin  (LIPITOR ) 80 MG tablet TAKE 1 TABLET BY MOUTH AT  BEDTIME 100 tablet 2   carvedilol  (COREG ) 6.25 MG tablet Take 1 tablet (6.25 mg total) by mouth 2 (two) times daily. 180 tablet 1   fluticasone -salmeterol (ADVAIR ) 250-50 MCG/ACT AEPB Inhale 1 puff into the lungs in the morning and at bedtime. 60 each 2   gabapentin  (NEURONTIN ) 300 MG capsule TAKE 1 CAPSULE BY MOUTH AT  BEDTIME 100 capsule 2   hydrALAZINE  (APRESOLINE ) 25 MG tablet Take 1 tablet (25 mg total) by mouth 3 (three) times daily. 270 tablet 1   meloxicam  (MOBIC ) 15 MG tablet TAKE 1 TABLET BY MOUTH DAILY 100 tablet 0   Menthol, Topical Analgesic, (BIOFREEZE) 5 % PTCH Apply 0.5 patches topically daily as needed (pain).     nystatin  (MYCOSTATIN ) 100000 UNIT/ML suspension SWISH AND SWALLOW 5 MLS (500,000 UNITS TOTAL) BY MOUTH 4 (FOUR) TIMES DAILY. 100 mL 0   omeprazole  (PRILOSEC) 40 MG capsule TAKE 1 CAPSULE BY MOUTH DAILY 100 capsule 2   oxyCODONE  (OXY IR/ROXICODONE ) 5 MG immediate release tablet Take 1 tablet (5 mg total) by mouth every 6 (six) hours as needed for severe pain (pain score 7-10). 15 tablet 0   potassium chloride  (KLOR-CON ) 10 MEQ tablet Take 2 tablets (20 mEq total) by mouth daily. 180 tablet 1   tiotropium (SPIRIVA  HANDIHALER) 18 MCG inhalation capsule Place 1 capsule (18 mcg total) into inhaler and inhale daily. 30 capsule 6   Vitamin D , Ergocalciferol , (DRISDOL ) 1.25  MG (50000 UNIT) CAPS capsule Take 1 capsule (50,000 Units total) by mouth every 7 (seven) days. 12 capsule 2   No current facility-administered medications on file prior to visit.    Allergies  Allergen Reactions   Vicodin [Hydrocodone -Acetaminophen ] Nausea And Vomiting and Other (See Comments)    Sweating   Bactrim  [Sulfamethoxazole -Trimethoprim ] Nausea And Vomiting    HA    Incruse Ellipta  [Umeclidinium Bromide ] Other (See Comments)    Dry mouth   Tramadol  Nausea And Vomiting   Wellbutrin  [Bupropion ] Other (See Comments)    Made me feel bad and sleepy    Social History   Socioeconomic History   Marital status: Married    Spouse name: Not on file   Number of children: Not on file   Years of education: Not on file   Highest education level: Some college, no degree  Occupational History   Not on file  Tobacco Use   Smoking status: Former    Current packs/day: 0.00    Average packs/day: 0.3 packs/day for 40.0 years (10.0 ttl pk-yrs)    Types: Cigarettes    Start date: 02/08/1977  Quit date: 02/08/2017    Years since quitting: 6.6    Passive exposure: Never   Smokeless tobacco: Never  Vaping Use   Vaping status: Never Used  Substance and Sexual Activity   Alcohol use: No   Drug use: No   Sexual activity: Yes    Birth control/protection: Post-menopausal, Surgical    Comment: BTL  Other Topics Concern   Not on file  Social History Narrative   Not on file   Social Drivers of Health   Financial Resource Strain: Low Risk  (09/28/2023)   Overall Financial Resource Strain (CARDIA)    Difficulty of Paying Living Expenses: Not hard at all  Food Insecurity: No Food Insecurity (09/28/2023)   Hunger Vital Sign    Worried About Running Out of Food in the Last Year: Never true    Ran Out of Food in the Last Year: Never true  Transportation Needs: No Transportation Needs (09/28/2023)   PRAPARE - Administrator, Civil Service (Medical): No    Lack of Transportation  (Non-Medical): No  Physical Activity: Inactive (09/28/2023)   Exercise Vital Sign    Days of Exercise per Week: 0 days    Minutes of Exercise per Session: 0 min  Stress: Stress Concern Present (09/28/2023)   [employer] of Occupational Health - Occupational Stress Questionnaire    Feeling of Stress: Rather much  Social Connections: Socially Integrated (09/28/2023)   Social Connection and Isolation Panel    Frequency of Communication with Friends and Family: More than three times a week    Frequency of Social Gatherings with Friends and Family: Three times a week    Attends Religious Services: More than 4 times per year    Active Member of Clubs or Organizations: Yes    Attends Banker Meetings: 1 to 4 times per year    Marital Status: Married  Catering manager Violence: Not At Risk (09/28/2023)   Humiliation, Afraid, Rape, and Kick questionnaire    Fear of Current or Ex-Partner: No    Emotionally Abused: No    Physically Abused: No    Sexually Abused: No    Family History  Problem Relation Age of Onset   Hyperlipidemia Mother    Hypertension Mother    Heart disease Mother        Atrial Fib.   Hyperlipidemia Sister    Hypertension Sister    Heart disease Sister        Before age 50,  CHF   Heart attack Brother    Hyperlipidemia Brother    Hypertension Brother    Heart disease Brother    Cancer Maternal Uncle        unknown type; mets   Gastric cancer Maternal Uncle    Prostate cancer Maternal Uncle        mets?   Gastric cancer Paternal Grandfather        dx >50   Hypertension Daughter    Breast cancer Cousin 25       maternal female cousin   Breast cancer Cousin        paternal female cousin; dx <50    Past Surgical History:  Procedure Laterality Date   APPENDECTOMY  1980   AXILLARY-FEMORAL BYPASS GRAFT Bilateral 10/29/2021   Procedure: LEFT AXILLOBIFEMORAL BYPASS GRAFT;  Surgeon: Eliza Lonni RAMAN, MD;  Location[facility] OR;  Service: Vascular;   Laterality: Bilateral;   BREAST BIOPSY Left 05/12/2023   US  LT BREAST BX W LOC DEV  1ST LESION IMG BX SPEC US  GUIDE 05/12/2023 [facility] M[facility]MO[facility]   BREAST BIOPSY  06/09/2023   MM LT RADIOACTIVE SEED LOC MAMMO GUIDE 06/09/2023 [facility]   BREAST LUMPECTOMY Left    s/p radiation therapy    BREAST LUMPECTOMY WITH RADIOACTIVE SEED LOCALIZATION Left 06/11/2023   Procedure: BREAST LUMPECTOMY WITH RADIOACTIVE SEED LOCALIZATION;  Surgeon: Vanderbilt Ned, MD;  Location: [facility];  Service: General;  Laterality: Left;  LEFT BREAST SEED LUMPECTOMY   BREAST SURGERY     COLONOSCOPY  07/2015   COLONOSCOPY WITH PROPOFOL  N/A 06/26/2015   Procedure: COLONOSCOPY WITH PROPOFOL ;  Surgeon: Gladis MARLA Louder, MD;  Location: [facility];  Service: Endoscopy;  Laterality: N/A;   CT CTA CORONARY W/CA SCORE W/CM &/OR WO/CM  12/10/2017   Coronary calcium  score is 0.  No evidence of CAD.   PERIPHERAL VASCULAR CATHETERIZATION N/A 10/22/2015   Procedure: Abdominal Aortogram w/Lower Extremity;  Surgeon: Lonni GORMAN Blade, MD;  Location: [facility] INVASIVE CV LAB: Aortic arch widely patent as far as innominate artery, right subclavian artery, right vertebral artery and right, common carotid.  Bilateral single renal arteries - Nonocclusive disease. Infrarenal aorta 100% - bilateral common Iliac 100% --> B Ex Iliacs fill via Int Iliac. Leg OK   TRANSTHORACIC ECHOCARDIOGRAM  08/2017   EF 60-65%. No RWMA.  Normal DF.  Normal valves.    TUBAL LIGATION  1991    ROS: Review of Systems Negative except as stated above  PHYSICAL EXAM: BP (!) 154/83   Pulse 64   Temp 97.7 F (36.5 C) (Oral)   Ht 5' 5 (1.651 m)   Wt 201 lb (91.2 kg)   SpO2 99%   BMI 33.45 kg/m   Wt Readings from Last 3 Encounters:  09/28/23 201 lb (91.2 kg)  06/26/23 203 lb 3 oz (92.2 kg)  06/16/23 204 lb (92.5 kg)  ' Physical Exam  General appearance - alert, well appearing, obese older African-American female and in no  distress Mental status - normal mood, behavior, speech, dress, motor activity, and thought processes Neck - supple, no significant adenopathy Chest - clear to auscultation, no wheezes, rales or rhonchi, symmetric air entry Heart - normal rate, regular rhythm, normal S1, S2, no murmurs, rubs, clicks or gallops Extremities - trace BL LE edema No significant edema noted in the upper arms.  She has good radial and brachial pulses bilaterally.  Arms are warm.      Latest Ref Rng & Units 05/20/2023   11:24 AM 12/25/2022    9:55 AM 10/30/2021    5:30 AM  CMP  Glucose 70 - 99 mg/dL 99  93  885   BUN 8 - 23 mg/dL 13  12  10    Creatinine 0.44 - 1.00 mg/dL 9.20  9.20  9.15   Sodium 135 - 145 mmol/L 139  144  137   Potassium 3.5 - 5.1 mmol/L 4.5  4.2  3.7   Chloride 98 - 111 mmol/L 106  104  100   CO2 22 - 32 mmol/L 30  26  24    Calcium  8.9 - 10.3 mg/dL 9.8  9.3  9.0   Total Protein 6.5 - 8.1 g/dL 7.9  7.3    Total Bilirubin 0.0 - 1.2 mg/dL 0.5  0.3    Alkaline Phos 38 - 126 U/L 78  111    AST 15 - 41 U/L 22  33    ALT 0 - 44 U/L 33  46  Lipid Panel     Component Value Date/Time   CHOL 146 12/25/2022 0955   TRIG 77 12/25/2022 0955   HDL 69 12/25/2022 0955   CHOLHDL 2.1 12/25/2022 0955   CHOLHDL 2.2 10/30/2021 0530   VLDL 10 10/30/2021 0530   LDLCALC 62 12/25/2022 0955    CBC    Component Value Date/Time   WBC 4.2 05/20/2023 1124   WBC 8.2 11/01/2021 1206   RBC 3.85 (L) 05/20/2023 1124   HGB 12.4 05/20/2023 1124   HGB 11.1 12/25/2022 0955   HGB 13.8 06/12/2008 0939   HCT 38.0 05/20/2023 1124   HCT 35.3 12/25/2022 0955   HCT 40.2 06/12/2008 0939   PLT 254 05/20/2023 1124   PLT 256 12/25/2022 0955   MCV 98.7 05/20/2023 1124   MCV 98 (H) 12/25/2022 0955   MCV 90.1 06/12/2008 0939   MCH 32.2 05/20/2023 1124   MCHC 32.6 05/20/2023 1124   RDW 13.2 05/20/2023 1124   RDW 12.7 12/25/2022 0955   RDW 13.9 06/12/2008 0939   LYMPHSABS 1.9 05/20/2023 1124   LYMPHSABS 0.4 (L)  03/10/2017 1335   LYMPHSABS 1.5 06/12/2008 0939   MONOABS 0.7 05/20/2023 1124   MONOABS 0.6 06/12/2008 0939   EOSABS 0.1 05/20/2023 1124   EOSABS 0.1 03/10/2017 1335   BASOSABS 0.1 05/20/2023 1124   BASOSABS 0.0 03/10/2017 1335   BASOSABS 0.0 06/12/2008 0939    ASSESSMENT AND PLAN: 1. Essential hypertension (Primary) Not at goal.  Increase Norvasc  to 10 mg daily. Continue spironolactone  25 mg daily, hydralazine  25 mg 3 times a day, carvedilol  6.25 mg twice a day - spironolactone  (ALDACTONE ) 25 MG tablet; Take 1 tablet (25 mg total) by mouth daily.  Dispense: 90 tablet; Refill: 3 - amLODipine  (NORVASC ) 10 MG tablet; Take 1 tablet (10 mg total) by mouth daily.  Dispense: 90 tablet; Refill: 1  2. Mixed hyperlipidemia Continue atorvastatin  80 mg daily  3. Ductal carcinoma in situ (DCIS) of left breast Patient experiencing some pain in the left shoulder and arm which she attributes to anastrozole .  No soft tissue swelling noted.  She has appointment with her oncologist tomorrow which she can discuss at that time.  4. Vitamin D  deficiency Recheck vitamin D  level to see whether she still needs high-dose vitamin D  - VITAMIN D  25 Hydroxy (Vit-D Deficiency, Fractures)  5. Chronic obstructive pulmonary disease, unspecified COPD type (HCC) Continue Advair  and Spiriva   6. Obesity (BMI 30.0-34.9) Continue to encourage healthy eating habits.  7. Arm pain, anterior, left See #3 above.   Patient was given the opportunity to ask questions.  Patient verbalized understanding of the plan and was able to repeat key elements of the plan.   This documentation was completed using Paediatric nurse.  Any transcriptional errors are unintentional.  Orders Placed This Encounter  Procedures   VITAMIN D  25 Hydroxy (Vit-D Deficiency, Fractures)     Requested Prescriptions   Signed Prescriptions Disp Refills   spironolactone  (ALDACTONE ) 25 MG tablet 90 tablet 3    Sig: Take 1  tablet (25 mg total) by mouth daily.   amLODipine  (NORVASC ) 10 MG tablet 90 tablet 1    Sig: Take 1 tablet (10 mg total) by mouth daily.    Return in about 4 months (around 01/29/2024).  Barnie Louder, MD, FACP

## 2023-09-29 ENCOUNTER — Ambulatory Visit: Payer: Self-pay | Admitting: Internal Medicine

## 2023-09-29 ENCOUNTER — Inpatient Hospital Stay: Attending: Hematology and Oncology | Admitting: Adult Health

## 2023-09-29 ENCOUNTER — Encounter: Payer: Self-pay | Admitting: Adult Health

## 2023-09-29 ENCOUNTER — Other Ambulatory Visit: Payer: Self-pay | Admitting: Internal Medicine

## 2023-09-29 VITALS — BP 140/68 | HR 64 | Temp 98.0°F | Resp 18 | Ht 65.0 in | Wt 200.8 lb

## 2023-09-29 DIAGNOSIS — M85851 Other specified disorders of bone density and structure, right thigh: Secondary | ICD-10-CM | POA: Diagnosis not present

## 2023-09-29 DIAGNOSIS — D0512 Intraductal carcinoma in situ of left breast: Secondary | ICD-10-CM | POA: Diagnosis not present

## 2023-09-29 DIAGNOSIS — I890 Lymphedema, not elsewhere classified: Secondary | ICD-10-CM | POA: Insufficient documentation

## 2023-09-29 DIAGNOSIS — Z87891 Personal history of nicotine dependence: Secondary | ICD-10-CM | POA: Insufficient documentation

## 2023-09-29 DIAGNOSIS — Z79811 Long term (current) use of aromatase inhibitors: Secondary | ICD-10-CM | POA: Insufficient documentation

## 2023-09-29 DIAGNOSIS — Z1722 Progesterone receptor negative status: Secondary | ICD-10-CM | POA: Insufficient documentation

## 2023-09-29 DIAGNOSIS — Z170 Estrogen receptor positive status [ER+]: Secondary | ICD-10-CM | POA: Diagnosis not present

## 2023-09-29 LAB — VITAMIN D 25 HYDROXY (VIT D DEFICIENCY, FRACTURES): Vit D, 25-Hydroxy: 44.9 ng/mL (ref 30.0–100.0)

## 2023-09-29 NOTE — Progress Notes (Unsigned)
 SURVIVORSHIP VISIT:  BRIEF ONCOLOGIC HISTORY:  Oncology History  Ductal carcinoma in situ (DCIS) of left breast  04/09/2023 Mammogram   In the left breast, a possible mass warrants further evaluation. In the right breast, no findings suspicious for malignancy. Suspicious 8 mm mass in the left breast at 2 o'clock, 5 cm the nipple. Tissue sampling is indicated. Single left axillary lymph node, deep, difficult to visualize and near the axillary artery, with eccentric cortical thickening.   05/18/2023 Initial Diagnosis   Ductal carcinoma in situ (DCIS) of left breast   05/20/2023 Pathology Results   . Breast, left, needle core biopsy, 2 o'clock, 5cmfn, coil clip :       DUCTAL CARCINOMA IN SITU, HIGH NUCLEAR GRADE       NECROSIS: NOT IDENTIFIED       CALCIFICATIONS: NOT IDENTIFIED       DCIS LENGTH: 0.6 CM        Estrogen Receptor:  100%, POSITIVE, STRONG STAINING INTENSITY  Progesterone Receptor:  0%, NEGATIVE  COMMENT:  The negative hormone receptor study(ies) in this case has an internal positive control    06/11/2023 Cancer Staging   Staging form: Breast, AJCC 8th Edition - Pathologic stage from 06/11/2023: Stage 0 (pTis (DCIS), pN0, cM0, ER+, PR-) - Signed by Crawford Morna Pickle, NP on 09/29/2023 Stage prefix: Initial diagnosis   06/11/2023 Surgery   Left lumpectomy: DCIS, 0.6 cm, margins negative   07/2023 -  Anti-estrogen oral therapy   1 mg Anastrozole  x 5 years     INTERVAL HISTORY:  Ms. Laughridge to review her survivorship care plan detailing her treatment course for breast cancer, as well as monitoring long-term side effects of that treatment, education regarding health maintenance, screening, and overall wellness and health promotion.     Overall, Ms. Jacquet reports feeling quite well   REVIEW OF SYSTEMS:  Review of Systems  Constitutional:  Negative for appetite change, chills, fatigue, fever and unexpected weight change.  HENT:   Negative for hearing loss, lump/mass  and trouble swallowing.   Eyes:  Negative for eye problems and icterus.  Respiratory:  Negative for chest tightness, cough and shortness of breath.   Cardiovascular:  Negative for chest pain, leg swelling and palpitations.  Gastrointestinal:  Negative for abdominal distention, abdominal pain, constipation, diarrhea, nausea and vomiting.  Endocrine: Negative for hot flashes.  Genitourinary:  Negative for difficulty urinating.   Musculoskeletal:  Negative for arthralgias.  Skin:  Positive for wound. Negative for itching and rash.  Neurological:  Negative for dizziness, extremity weakness, headaches and numbness.  Hematological:  Negative for adenopathy. Does not bruise/bleed easily.  Psychiatric/Behavioral:  Negative for depression. The patient is not nervous/anxious.    Breast: Denies any new nodularity, masses, tenderness, nipple changes, or nipple discharge.       PAST MEDICAL/SURGICAL HISTORY:  Past Medical History:  Diagnosis Date   Arthritis    back problems to be evaluated by neurology next month   Cancer [facility]) 2009   L, Breast, radiation and surgery.   COPD (chronic obstructive pulmonary disease) (HCC)    Depression    GERD (gastroesophageal reflux disease)    Hyperlipemia    Hypertension    Leriche syndrome (HCC)    Infrarenal aortic occlusion extending to bilateral internal iliac arteries.  External iliacs fill via collaterals from lumbar arteries to hypogastric/internal iliac arteries -minimal disease on the right side and no disease on the left side leg arteries distally.   Peripheral arterial disease (HCC)  Peripheral vascular disease (HCC) 2013   Pre-diabetes    Past Surgical History:  Procedure Laterality Date   APPENDECTOMY  1980   AXILLARY-FEMORAL BYPASS GRAFT Bilateral 10/29/2021   Procedure: LEFT AXILLOBIFEMORAL BYPASS GRAFT;  Surgeon: Eliza Lonni RAMAN, MD;  Location[facility] OR;  Service: Vascular;  Laterality: Bilateral;   BREAST BIOPSY Left 05/12/2023    US  LT BREAST BX W LOC DEV 1ST LESION IMG BX SPEC US  GUIDE 05/12/2023 [facility] MAMMOGRAPHY   BREAST BIOPSY  06/09/2023   MM LT RADIOACTIVE SEED LOC MAMMO GUIDE 06/09/2023 [facility] MAMMOGRAPHY   BREAST LUMPECTOMY Left    s/p radiation therapy    BREAST LUMPECTOMY WITH RADIOACTIVE SEED LOCALIZATION Left 06/11/2023   Procedure: BREAST LUMPECTOMY WITH RADIOACTIVE SEED LOCALIZATION;  Surgeon: Vanderbilt Ned, MD;  Location: [facility];  Service: General;  Laterality: Left;  LEFT BREAST SEED LUMPECTOMY   BREAST SURGERY     COLONOSCOPY  07/2015   COLONOSCOPY WITH PROPOFOL  N/A 06/26/2015   Procedure: COLONOSCOPY WITH PROPOFOL ;  Surgeon: Gladis MARLA Louder, MD;  Location: [facility];  Service: Endoscopy;  Laterality: N/A;   CT CTA CORONARY W/CA SCORE W/CM &/OR WO/CM  12/10/2017   Coronary calcium  score is 0.  No evidence of CAD.   PERIPHERAL VASCULAR CATHETERIZATION N/A 10/22/2015   Procedure: Abdominal Aortogram w/Lower Extremity;  Surgeon: Lonni RAMAN Eliza, MD;  Location: [facility] INVASIVE CV LAB: Aortic arch widely patent as far as innominate artery, right subclavian artery, right vertebral artery and right, common carotid.  Bilateral single renal arteries - Nonocclusive disease. Infrarenal aorta 100% - bilateral common Iliac 100% --> B Ex Iliacs fill via Int Iliac. Leg OK   TRANSTHORACIC ECHOCARDIOGRAM  08/2017   EF 60-65%. No RWMA.  Normal DF.  Normal valves.    TUBAL LIGATION  1991     ALLERGIES:  Allergies  Allergen Reactions   Hydrocodone -Acetaminophen  Nausea And Vomiting and Other (See Comments)    Sweating   Vicodin [Hydrocodone -Acetaminophen ] Nausea And Vomiting and Other (See Comments)    Sweating   Bactrim  [Sulfamethoxazole -Trimethoprim ] Nausea And Vomiting    HA    Bupropion  Other (See Comments)    Made me feel bad and sleepy   Tramadol  Nausea And Vomiting   Umeclidinium Bromide  Other (See Comments)    Dry mouth     CURRENT MEDICATIONS:  Outpatient Encounter Medications  as of 09/29/2023  Medication Sig   acetaminophen  (TYLENOL ) 500 MG tablet Take 1,000 mg by mouth every 8 (eight) hours.   albuterol  (PROAIR  HFA) 108 (90 Base) MCG/ACT inhaler Inhale 2 puffs into the lungs every 6 (six) hours as needed for wheezing or shortness of breath.   amLODipine  (NORVASC ) 10 MG tablet Take 1 tablet (10 mg total) by mouth daily.   anastrozole  (ARIMIDEX ) 1 MG tablet Take 1 tablet (1 mg total) by mouth daily.   aspirin  EC 81 MG tablet Take 1 tablet (81 mg total) by mouth daily.   atorvastatin  (LIPITOR ) 80 MG tablet TAKE 1 TABLET BY MOUTH AT  BEDTIME   carvedilol  (COREG ) 6.25 MG tablet Take 1 tablet (6.25 mg total) by mouth 2 (two) times daily.   fluticasone -salmeterol (ADVAIR ) 250-50 MCG/ACT AEPB Inhale 1 puff into the lungs in the morning and at bedtime.   gabapentin  (NEURONTIN ) 300 MG capsule TAKE 1 CAPSULE BY MOUTH AT  BEDTIME   hydrALAZINE  (APRESOLINE ) 25 MG tablet Take 1 tablet (25 mg total) by mouth 3 (three) times daily.   meloxicam  (MOBIC ) 15 MG tablet TAKE 1 TABLET  BY MOUTH DAILY   Menthol, Topical Analgesic, (BIOFREEZE) 5 % PTCH Apply 0.5 patches topically daily as needed (pain).   nystatin  (MYCOSTATIN ) 100000 UNIT/ML suspension SWISH AND SWALLOW 5 MLS (500,000 UNITS TOTAL) BY MOUTH 4 (FOUR) TIMES DAILY.   omeprazole  (PRILOSEC) 40 MG capsule TAKE 1 CAPSULE BY MOUTH DAILY   oxyCODONE  (OXY IR/ROXICODONE ) 5 MG immediate release tablet Take 1 tablet (5 mg total) by mouth every 6 (six) hours as needed for severe pain (pain score 7-10).   potassium chloride  (KLOR-CON ) 10 MEQ tablet Take 2 tablets (20 mEq total) by mouth daily.   spironolactone  (ALDACTONE ) 25 MG tablet Take 1 tablet (25 mg total) by mouth daily.   tiotropium (SPIRIVA  HANDIHALER) 18 MCG inhalation capsule Place 1 capsule (18 mcg total) into inhaler and inhale daily.   [DISCONTINUED] amLODipine  (NORVASC ) 5 MG tablet Take 1 tablet (5 mg total) by mouth daily.   [DISCONTINUED] spironolactone  (ALDACTONE ) 25 MG  tablet Take 1 tablet (25 mg total) by mouth daily.   [DISCONTINUED] Vitamin D , Ergocalciferol , (DRISDOL ) 1.25 MG (50000 UNIT) CAPS capsule Take 1 capsule (50,000 Units total) by mouth every 7 (seven) days.   No facility-administered encounter medications on file as of 09/29/2023.     ONCOLOGIC FAMILY HISTORY:  Family History  Problem Relation Age of Onset   Hyperlipidemia Mother    Hypertension Mother    Heart disease Mother        Atrial Fib.   Hyperlipidemia Sister    Hypertension Sister    Heart disease Sister        Before age 67,  CHF   Heart attack Brother    Hyperlipidemia Brother    Hypertension Brother    Heart disease Brother    Cancer Maternal Uncle        unknown type; mets   Gastric cancer Maternal Uncle    Prostate cancer Maternal Uncle        mets?   Gastric cancer Paternal Grandfather        dx >50   Hypertension Daughter    Breast cancer Cousin 1       maternal female cousin   Breast cancer Cousin        paternal female cousin; dx <50     SOCIAL HISTORY:  Social History   Socioeconomic History   Marital status: Married    Spouse name: Not on file   Number of children: Not on file   Years of education: Not on file   Highest education level: Some college, no degree  Occupational History   Not on file  Tobacco Use   Smoking status: Former    Current packs/day: 0.00    Average packs/day: 0.3 packs/day for 40.0 years (10.0 ttl pk-yrs)    Types: Cigarettes    Start date: 02/08/1977    Quit date: 02/08/2017    Years since quitting: 6.6    Passive exposure: Never   Smokeless tobacco: Never  Vaping Use   Vaping status: Never Used  Substance and Sexual Activity   Alcohol use: No   Drug use: No   Sexual activity: Yes    Birth control/protection: Post-menopausal, Surgical    Comment: BTL  Other Topics Concern   Not on file  Social History Narrative   Not on file   Social Drivers of Health   Financial Resource Strain: Low Risk  (09/28/2023)    Overall Financial Resource Strain (CARDIA)    Difficulty of Paying Living Expenses: Not hard at all  Food Insecurity: No Food Insecurity (09/28/2023)   Hunger Vital Sign    Worried About Running Out of Food in the Last Year: Never true    Ran Out of Food in the Last Year: Never true  Transportation Needs: No Transportation Needs (09/28/2023)   PRAPARE - Administrator, Civil Service (Medical): No    Lack of Transportation (Non-Medical): No  Physical Activity: Inactive (09/28/2023)   Exercise Vital Sign    Days of Exercise per Week: 0 days    Minutes of Exercise per Session: 0 min  Stress: Stress Concern Present (09/28/2023)   [employer] of Occupational Health - Occupational Stress Questionnaire    Feeling of Stress: Rather much  Social Connections: Socially Integrated (09/28/2023)   Social Connection and Isolation Panel    Frequency of Communication with Friends and Family: More than three times a week    Frequency of Social Gatherings with Friends and Family: Three times a week    Attends Religious Services: More than 4 times per year    Active Member of Clubs or Organizations: Yes    Attends Banker Meetings: 1 to 4 times per year    Marital Status: Married  Catering manager Violence: Not At Risk (09/28/2023)   Humiliation, Afraid, Rape, and Kick questionnaire    Fear of Current or Ex-Partner: No    Emotionally Abused: No    Physically Abused: No    Sexually Abused: No     OBSERVATIONS/OBJECTIVE:  BP (!) 140/68 (BP Location: Right Arm, Patient Position: Sitting)   Pulse 64   Temp 98 F (36.7 C) (Tympanic)   Resp 18   Ht 5' 5 (1.651 m)   Wt 200 lb 12.8 oz (91.1 kg)   SpO2 98%   BMI 33.41 kg/m  GENERAL: Patient is a well appearing female in no acute distress HEENT:  Sclerae anicteric.  Oropharynx clear and moist. No ulcerations or evidence of oropharyngeal candidiasis. Neck is supple.  NODES:  No cervical, supraclavicular, or axillary  lymphadenopathy palpated.  BREAST EXAM:  right breast benign, left breast s/p lumpectomy, small open wound about 2mm in size, draining sanguinous drainage on dressing, appears to be healing, no swelling, erythema, or warmth noted. LUNGS:  Clear to auscultation bilaterally.  No wheezes or rhonchi. HEART:  Regular rate and rhythm. No murmur appreciated. ABDOMEN:  Soft, nontender.  Positive, normoactive bowel sounds. No organomegaly palpated. MSK:  No focal spinal tenderness to palpation. Full range of motion bilaterally in the upper extremities. EXTREMITIES:  No peripheral edema.   SKIN:  Clear with no obvious rashes or skin changes. No nail dyscrasia. NEURO:  Nonfocal. Well oriented.  Appropriate affect.   LABORATORY DATA:  None for this visit.  DIAGNOSTIC IMAGING:  None for this visit.      ASSESSMENT AND PLAN:  Ms.. Lai is a pleasant 68 y.o. female with Stage *** right/left breast invasive ductal carcinoma, ER+/PR+/HER2-, diagnosed in ***, treated with lumpectomy, adjuvant radiation therapy, and anti-estrogen therapy with *** beginning in ***.  She presents to the Survivorship Clinic for our initial meeting and routine follow-up post-completion of treatment for breast cancer.    1. Stage *** right/left breast cancer:  Ms. Covington is continuing to recover from definitive treatment for breast cancer. She will follow-up with her medical oncologist, Dr.  PIERRETTE with history and physical exam per surveillance protocol.  She will continue her anti-estrogen therapy with ***. Thus far, she is tolerating the *** well, with minimal side  effects. Her mammogram is due ***; orders placed today.   Today, a comprehensive survivorship care plan and treatment summary was reviewed with the patient today detailing her breast cancer diagnosis, treatment course, potential late/long-term effects of treatment, appropriate follow-up care with recommendations for the future, and patient education resources.  A copy  of this summary, along with a letter will be sent to the patient's primary care provider via mail/fax/In Basket message after today's visit.    #. Problem(s) at Visit______________  #. Bone health:  Given Ms. Appleyard's age/history of breast cancer and her current treatment regimen including anti-estrogen therapy with Anastrozole , she is at risk for bone demineralization.  Her last DEXA scan was 10/22/2022, which showed osteopenia with a t score of -1.6 in the right femur.  Repeat is recommended in 10/2024. She was given education on specific activities to promote bone health.  #. Cancer screening:  Due to Ms. Bille's history and her age, she should receive screening for skin cancers, colon cancer, and gynecologic cancers.  The information and recommendations are listed on the patient's comprehensive care plan/treatment summary and were reviewed in detail with the patient.    #Lung cancer screening  I discussed screening Low Dose Ct chest without contrast for early detection of lung cancer in this Counseling and Shared Decision-Making Visit Patient Laylamarie [employer] Wint with 08-Aug-1955 and Age 68 y.o. years met the following criteria and I counseled that in  A) age 91-75 AND B) smoking history of 20 pack year smoking AND C) Current smoker or one who has quit smoking within the last 15 years I counseled - Annual low dose CT chest can pick up lung cancer early and has potential to save lives and cure lung cancer - This is similar in concept to screening mammogram, colonoscopies and pap smears - I explained Ct scan chest is low dose radiation CT chest - I explained early lung cancer asymptomatic and only way to  detect is CT  With the real advantage that early lung cancer is curable through radiation or surgery - I explained CT superior to CXR - I explained that false positives are present and can incur cost and workup like biopsies, additional scan but benefit outweighs risk - I counseled that  patient should abstain from smoking - I counseled that patient should adhered to protocol requirements of scan and followup scans   #. Health maintenance and wellness promotion: Ms. Kelleher was encouraged to consume 5-7 servings of fruits and vegetables per day. We reviewed the Nutrition Rainbow handout.  She was also encouraged to engage in moderate to vigorous exercise for 30 minutes per day most days of the week.  She was instructed to limit her alcohol consumption and continue to abstain from tobacco use/***was encouraged stop smoking.     #. Support services/counseling: It is not uncommon for this period of the patient's cancer care trajectory to be one of many emotions and stressors.   She was given information regarding our available services and encouraged to contact me with any questions or for help enrolling in any of our support group/programs.    Follow up instructions:    -Return to cancer center ***  -Mammogram due in *** -Bone density testing in 10/2024 -She is welcome to return back to the Survivorship Clinic at any time; no additional follow-up needed at this time.  -Consider referral back to survivorship as a long-term survivor for continued surveillance  The patient was provided an opportunity to ask questions and  all were answered. The patient agreed with the plan and demonstrated an understanding of the instructions.   Total encounter time:*** minutes*in face-to-face visit time, chart review, lab review, care coordination, order entry, and documentation of the encounter time.    Morna Kendall, NP 09/29/23 12:30 PM Medical Oncology and Hematology [facility] [address] [place], [place] [address] Tel. [phone]    Fax. [phone]  *Total Encounter Time as defined by the Centers for Medicare and Medicaid Services includes, in addition to the face-to-face time of a patient visit (documented in the note above) non-face-to-face time: obtaining and  reviewing outside history, ordering and reviewing medications, tests or procedures, care coordination (communications with other health care professionals or caregivers) and documentation in the medical record.

## 2023-09-30 ENCOUNTER — Other Ambulatory Visit: Payer: Self-pay

## 2023-09-30 ENCOUNTER — Other Ambulatory Visit: Payer: Self-pay | Admitting: Internal Medicine

## 2023-09-30 MED ORDER — NYSTATIN 100000 UNIT/ML MT SUSP
5.0000 mL | Freq: Four times a day (QID) | OROMUCOSAL | 0 refills | Status: DC
  Filled 2023-09-30: qty 100, 5d supply, fill #0

## 2023-10-01 ENCOUNTER — Other Ambulatory Visit: Payer: Self-pay

## 2023-10-06 ENCOUNTER — Other Ambulatory Visit: Payer: Self-pay | Admitting: Internal Medicine

## 2023-10-06 DIAGNOSIS — Z760 Encounter for issue of repeat prescription: Secondary | ICD-10-CM

## 2023-10-07 NOTE — Telephone Encounter (Signed)
 Requested medication (s) are due for refill today: Yes  Requested medication (s) are on the active medication list: Yes  Last refill:  06/17/23  Future visit scheduled: Yes  Notes to clinic:  Dorn review.    Requested Prescriptions  Pending Prescriptions Disp Refills   meloxicam  (MOBIC ) 15 MG tablet [Pharmacy Med Name: Meloxicam  15 MG Oral Tablet] 100 tablet 2    Sig: TAKE 1 TABLET BY MOUTH DAILY     Analgesics:  COX2 Inhibitors Failed - 10/07/2023  3:08 PM      Failed - Manual Review: Labs are only required if the patient has taken medication for more than 8 weeks.      Passed - HGB in normal range and within 360 days    Hemoglobin  Date Value Ref Range Status  05/20/2023 12.4 12.0 - 15.0 g/dL Final  89/75/7975 88.8 11.1 - 15.9 g/dL Final   HGB  Date Value Ref Range Status  06/12/2008 13.8 11.6 - 15.9 g/dL Final         Passed - Cr in normal range and within 360 days    Creatinine  Date Value Ref Range Status  05/20/2023 0.79 0.44 - 1.00 mg/dL Final   Creat  Date Value Ref Range Status  04/17/2016 0.77 0.50 - 1.05 mg/dL Final    Comment:      For patients > or = 67 years of age: The upper reference limit for Creatinine is approximately 13% higher for people identified as African-American.            Passed - HCT in normal range and within 360 days    HCT  Date Value Ref Range Status  05/20/2023 38.0 36.0 - 46.0 % Final  06/12/2008 40.2 34.8 - 46.6 % Final   Hematocrit  Date Value Ref Range Status  12/25/2022 35.3 34.0 - 46.6 % Final         Passed - AST in normal range and within 360 days    AST  Date Value Ref Range Status  05/20/2023 22 15 - 41 U/L Final         Passed - ALT in normal range and within 360 days    ALT  Date Value Ref Range Status  05/20/2023 33 0 - 44 U/L Final         Passed - eGFR is 30 or above and within 360 days    GFR, Est African American  Date Value Ref Range Status  04/17/2016 >89 >=60 mL/min Final   GFR calc Af  Amer  Date Value Ref Range Status  04/28/2019 93 >59 mL/min/1.73 Final   GFR, Est Non African American  Date Value Ref Range Status  04/17/2016 85 >=60 mL/min Final   GFR, Estimated  Date Value Ref Range Status  05/20/2023 >60 >60 mL/min Final    Comment:    (NOTE) Calculated using the CKD-EPI Creatinine Equation (2021)    eGFR  Date Value Ref Range Status  12/25/2022 82 >59 mL/min/1.73 Final         Passed - Patient is not pregnant      Passed - Valid encounter within last 12 months    Recent Outpatient Visits           1 week ago Essential hypertension   [facility] [place]. [facility] Vicci Sober B, MD   3 months ago Ductal carcinoma in situ (DCIS) of left breast   [facility] [place]. [facility] Vicci Barnie NOVAK, MD   9 months ago Essential hypertension   [facility] [place]. [facility] Vicci Barnie NOVAK, MD   1 year ago Essential hypertension   [facility] [place]. [facility] Vicci Barnie NOVAK, MD   1 year ago Essential hypertension   [facility] [place]. [facility] Vicci Barnie NOVAK, MD       Future Appointments             In 3 months Vicci Barnie NOVAK, MD [facility] Of [place]. [facility]

## 2023-10-12 DIAGNOSIS — I890 Lymphedema, not elsewhere classified: Secondary | ICD-10-CM | POA: Diagnosis not present

## 2023-10-12 DIAGNOSIS — D0512 Intraductal carcinoma in situ of left breast: Secondary | ICD-10-CM | POA: Diagnosis not present

## 2023-10-12 NOTE — Therapy (Incomplete)
 OUTPATIENT PHYSICAL THERAPY  UPPER EXTREMITY ONCOLOGY EVALUATION  Patient Name: Brandy Stevenson MRN: [id number] DOB:[birth year], 68 y.o., female Today's Date: 10/12/2023  END OF SESSION:   Past Medical History:  Diagnosis Date   Arthritis    back problems to be evaluated by neurology next month   Cancer [facility] 2009   L, Breast, radiation and surgery.   COPD (chronic obstructive pulmonary disease) (HCC)    Depression    GERD (gastroesophageal reflux disease)    Hyperlipemia    Hypertension    Leriche syndrome (HCC)    Infrarenal aortic occlusion extending to bilateral internal iliac arteries.  External iliacs fill via collaterals from lumbar arteries to hypogastric/internal iliac arteries -minimal disease on the right side and no disease on the left side leg arteries distally.   Peripheral arterial disease (HCC)    Peripheral vascular disease (HCC) 2013   Pre-diabetes    Past Surgical History:  Procedure Laterality Date   APPENDECTOMY  1980   AXILLARY-FEMORAL BYPASS GRAFT Bilateral 10/29/2021   Procedure: LEFT AXILLOBIFEMORAL BYPASS GRAFT;  Surgeon: Eliza Lonni RAMAN, MD;  Location: [facility] OR;  Service: Vascular;  Laterality: Bilateral;   BREAST BIOPSY Left 05/12/2023   US  LT BREAST BX W LOC DEV 1ST LESION IMG BX SPEC US  GUIDE 05/12/2023 [facility]   BREAST BIOPSY  06/09/2023   MM LT RADIOACTIVE SEED LOC MAMMO GUIDE 06/09/2023 [facility]   BREAST LUMPECTOMY Left    s/p radiation therapy    BREAST LUMPECTOMY WITH RADIOACTIVE SEED LOCALIZATION Left 06/11/2023   Procedure: BREAST LUMPECTOMY WITH RADIOACTIVE SEED LOCALIZATION;  Surgeon: Vanderbilt Ned, MD;  Location: [facility];  Service: General;  Laterality: Left;  LEFT BREAST SEED LUMPECTOMY   BREAST SURGERY     COLONOSCOPY  07/2015   COLONOSCOPY WITH PROPOFOL  N/A 06/26/2015   Procedure: COLONOSCOPY WITH PROPOFOL ;  Surgeon: Gladis MARLA Louder, MD;  Location: [facility];  Service: Endoscopy;   Laterality: N/A;   CT CTA CORONARY W/CA SCORE W/CM &/OR WO/CM  12/10/2017   Coronary calcium  score is 0.  No evidence of CAD.   PERIPHERAL VASCULAR CATHETERIZATION N/A 10/22/2015   Procedure: Abdominal Aortogram w/Lower Extremity;  Surgeon: Lonni RAMAN Eliza, MD;  Location: [facility] INVASIVE CV LAB: Aortic arch widely patent as far as innominate artery, right subclavian artery, right vertebral artery and right, common carotid.  Bilateral single renal arteries - Nonocclusive disease. Infrarenal aorta 100% - bilateral common Iliac 100% --> B Ex Iliacs fill via Int Iliac. Leg OK   TRANSTHORACIC ECHOCARDIOGRAM  08/2017   EF 60-65%. No RWMA.  Normal DF.  Normal valves.    TUBAL LIGATION  1991   Patient Active Problem List   Diagnosis Date Noted   Ductal carcinoma in situ (DCIS) of left breast 05/18/2023   Osteopenia of femoral neck 10/22/2022   Prediabetes 06/15/2021   Influenza vaccine refused 02/03/2020   Resistant hypertension 01/20/2020   Tubular adenoma of colon 04/27/2019   Cervical radiculopathy 10/01/2018   Hypertensive retinopathy 09/08/2018   Seasonal allergies 06/29/2018   Aortoiliac occlusive disease (HCC) 11/04/2017   DOE (dyspnea on exertion) 11/03/2017   Chronic chest wall pain 11/03/2017   Ichthyosis 10/06/2017   Former smoker 04/07/2017   Primary osteoarthritis of right shoulder 04/07/2017   Chronic midline low back pain without sciatica 04/07/2017   Hiatal hernia 05/01/2016   DJD (degenerative joint disease) of cervical spine 04/17/2016   History of breast cancer 02/18/2016   Foot callus 02/18/2016   Anxiety and  depression 05/22/2015   Insomnia 03/15/2015   Vitamin D  deficiency 04/08/2013   Hyperlipidemia with target LDL less than 70 04/08/2013   Atherosclerotic PVD with intermittent claudication (HCC) 03/22/2013   Accelerated hypertension 02/23/2013   GERD (gastroesophageal reflux disease) 02/23/2013   COPD (chronic obstructive pulmonary disease) (HCC) 02/23/2013     PCP:   REFERRING PROVIDER: Morna Kendall, NP  REFERRING DIAG: Left breast swelling  THERAPY DIAG:  No diagnosis found.  ONSET DATE: 06/11/2023  Rationale for Evaluation and Treatment: Rehabilitation  SUBJECTIVE:                                                                                                                                                                                           SUBJECTIVE STATEMENT:   PERTINENT HISTORY:  Pt had a left lumpectomy for DCIS in 2009, with radiation. She had a re-occurrence of DCIS with Left lumpectomy performed on 06/11/2023. Due to prior radiation to that side she has had difficulty with wound healing and a chronic seroma. She has had some recurrent joint pain since starting anastrozole . She has a history of Axillary femoral bypass on 10/29/2021. She also has COPD, and OA of her shoulders  PAIN:  Are you having pain? {yes/no:20286} NPRS scale: ***/10 Pain location: *** Pain orientation: {Pain Orientation:25161}  PAIN TYPE: {type:313116} Pain description: {PAIN DESCRIPTION:21022940}  Aggravating factors: *** Relieving factors: ***  PRECAUTIONS: COPD, OA shoulders, Axillary-femoral Bypass 10/2021, PVD with intermittent claudication  RED FLAGS: {PT Red Flags:29287}   WEIGHT BEARING RESTRICTIONS: No  FALLS:  Has patient fallen in last 6 months? {fallsyesno:27318}  LIVING ENVIRONMENT: Lives with: {[facility] lives with:25569::lives with their family} Lives in: {Lives in:25570} Stairs: {yes/no:20286}; {Stairs:24000} Has following equipment at home: {Assistive devices:23999}  OCCUPATION: ***  LEISURE: ***  HAND DOMINANCE: {RIGHT/LEFT:21944}   PRIOR LEVEL OF FUNCTION: Independent  PATIENT GOALS: ***   OBJECTIVE: Note: Objective measures were completed at Evaluation unless otherwise noted.  COGNITION: Overall cognitive status: Within functional limits for tasks assessed   PALPATION: ***  OBSERVATIONS / OTHER  ASSESSMENTS: ***  SENSATION: Light touch: {intact/deficits:24005}   POSTURE: forward head, rounded shoulders  UPPER EXTREMITY AROM/PROM:  A/PROM RIGHT   eval   Shoulder extension   Shoulder flexion   Shoulder abduction   Shoulder internal rotation   Shoulder external rotation     (Blank rows = not tested)  A/PROM LEFT   eval  Shoulder extension   Shoulder flexion   Shoulder abduction   Shoulder internal rotation   Shoulder external rotation     (Blank rows = not tested)  CERVICAL AROM: All within normal limits:  Percent limited  Flexion   Extension   Right lateral flexion   Left lateral flexion   Right rotation   Left rotation     UPPER EXTREMITY STRENGTH:   LYMPHEDEMA ASSESSMENTS:   SURGERY TYPE/DATE: Left Lumpectomy 2009, Left Lumpectomy 06/11/2023  NUMBER OF LYMPH NODES REMOVED: 0  CHEMOTHERAPY: NO  RADIATION:2009 left Breast  HORMONE TREATMENT: Anastrozole   INFECTIONS: ***   LYMPHEDEMA ASSESSMENTS:   LANDMARK RIGHT  eval  At axilla    15 cm proximal to olecranon process   10 cm proximal to olecranon process   Olecranon process   15 cm proximal to ulnar styloid process   10 cm proximal to ulnar styloid process   Just proximal to ulnar styloid process   Across hand at thumb web space   At base of 2nd digit   (Blank rows = not tested)  LANDMARK LEFT  eval  At axilla    15 cm proximal to olecranon process   10 cm proximal to olecranon process   Olecranon process   15 cm proximal to ulnar styloid process   10 cm proximal to ulnar styloid process   Just proximal to ulnar styloid process   Across hand at thumb web space   At base of 2nd digit   (Blank rows = not tested)   FUNCTIONAL TESTS:  {Functional tests:24029}  GAIT: Distance walked: *** Assistive device utilized: {Assistive devices:23999} Level of assistance: {Levels of assistance:24026} Comments: ***  L-DEX LYMPHEDEMA SCREENING: The patient was assessed using the  L-Dex machine today to produce a lymphedema index baseline score. The patient will be reassessed on a regular basis (typically every 3 months) to obtain new L-Dex scores. If the score is > 6.5 points away from his/her baseline score indicating onset of subclinical lymphedema, it will be recommended to wear a compression garment for 4 weeks, 12 hours per day and then be reassessed. If the score continues to be > 6.5 points from baseline at reassessment, we will initiate lymphedema treatment. Assessing in this manner has a 95% rate of preventing clinically significant lymphedema.  QUICK DASH SURVEY: ***                                                                                                                            TREATMENT DATE: ***    PATIENT EDUCATION:  Education details: *** Person educated: {Person educated:25204} Education method: {Education Method:25205} Education comprehension: {Education Comprehension:25206}  HOME EXERCISE PROGRAM: ***  ASSESSMENT:  CLINICAL IMPRESSION: Patient is a 68 y.o. female who was seen today for physical therapy evaluation and treatment for complaints of  left breast swelling with  a small non healing wound..    OBJECTIVE IMPAIRMENTS: {opptimpairments:25111}.   ACTIVITY LIMITATIONS: {activitylimitations:27494}  PARTICIPATION LIMITATIONS: {participationrestrictions:25113}  PERSONAL FACTORS: {Personal factors:25162} are also affecting patient's functional outcome.   REHAB POTENTIAL: {rehabpotential:25112}  CLINICAL DECISION MAKING: {clinical decision making:25114}  EVALUATION COMPLEXITY: {Evaluation complexity:25115}  GOALS: Goals reviewed with patient? {yes/no:20286}  SHORT TERM GOALS: Target date: ***  Pt will be independent with HEP to improve shoulder ROM Baseline: Goal status: INITIAL  2.  Pt will be independent with compression bra to decrease left breast swelling Baseline:  Goal status: INITIAL  3.  Pt will be  independent with MLD techniques to the left breast to decrease swelling Baseline:  Goal status: INITIAL  4.  *** Baseline:  Goal status: INITIAL  5.  *** Baseline:  Goal status: INITIAL  6.  *** Baseline:  Goal status: INITIAL  LONG TERM GOALS: Target date: ***  *** Baseline:  Goal status: INITIAL  2.  *** Baseline:  Goal status: INITIAL  3.  *** Baseline:  Goal status: INITIAL  4.  *** Baseline:  Goal status: INITIAL  5.  *** Baseline:  Goal status: INITIAL  6.  *** Baseline:  Goal status: INITIAL  PLAN:  PT FREQUENCY: {rehab frequency:25116}  PT DURATION: {rehab duration:25117}  PLANNED INTERVENTIONS: {rehab planned interventions:25118::Patient/Family education,Balance training,Joint mobilization,Therapeutic exercises,Therapeutic activity,Neuromuscular re-education,Gait training,Self Care}  PLAN FOR NEXT SESSION: ***  Grayce JINNY Sheldon, PT 10/12/2023, 9:39 PM

## 2023-10-13 ENCOUNTER — Telehealth: Payer: Self-pay

## 2023-10-13 ENCOUNTER — Ambulatory Visit: Attending: Adult Health

## 2023-10-13 NOTE — Telephone Encounter (Signed)
 Tried calling pt due to No Show for her evaluation. Voicemail was full and I was unable to leave a message.

## 2023-10-16 ENCOUNTER — Telehealth: Payer: Self-pay

## 2023-10-16 ENCOUNTER — Ambulatory Visit: Admitting: Internal Medicine

## 2023-10-16 NOTE — Telephone Encounter (Signed)
 Pt called requesting an appt, states she is very concerned about her legs that have felt fine until about a week ago. She states they are swelling w/ pain and cramping. She has also been advised to let her cardiologist know. She has been scheduled next week with studies and to see MD.

## 2023-10-21 ENCOUNTER — Encounter: Payer: Self-pay | Admitting: Vascular Surgery

## 2023-10-21 ENCOUNTER — Ambulatory Visit (HOSPITAL_COMMUNITY)

## 2023-10-21 ENCOUNTER — Ambulatory Visit: Attending: Vascular Surgery | Admitting: Vascular Surgery

## 2023-10-21 ENCOUNTER — Ambulatory Visit (HOSPITAL_COMMUNITY)
Admission: RE | Admit: 2023-10-21 | Discharge: 2023-10-21 | Disposition: A | Discharge status: Home / Self Care | Source: Ambulatory Visit | Attending: Vascular Surgery | Admitting: Vascular Surgery

## 2023-10-21 ENCOUNTER — Ambulatory Visit (HOSPITAL_BASED_OUTPATIENT_CLINIC_OR_DEPARTMENT_OTHER)
Admission: RE | Admit: 2023-10-21 | Discharge: 2023-10-21 | Disposition: A | Discharge status: Home / Self Care | Source: Ambulatory Visit | Attending: Vascular Surgery | Admitting: Vascular Surgery

## 2023-10-21 VITALS — BP 124/77 | HR 60 | Temp 97.7°F | Resp 20 | Ht 65.0 in | Wt 199.2 lb

## 2023-10-21 DIAGNOSIS — T82858D Stenosis of vascular prosthetic devices, implants and grafts, subsequent encounter: Secondary | ICD-10-CM | POA: Diagnosis not present

## 2023-10-21 DIAGNOSIS — T82858S Stenosis of vascular prosthetic devices, implants and grafts, sequela: Secondary | ICD-10-CM

## 2023-10-21 DIAGNOSIS — I739 Peripheral vascular disease, unspecified: Secondary | ICD-10-CM | POA: Diagnosis not present

## 2023-10-21 DIAGNOSIS — I7409 Other arterial embolism and thrombosis of abdominal aorta: Secondary | ICD-10-CM

## 2023-10-21 LAB — VAS US ABI WITH/WO TBI
Left ABI: 1.02
Right ABI: 1

## 2023-10-21 NOTE — Progress Notes (Signed)
 Patient ID: Brandy Stevenson, female   DOB: 1956/02/12, 68 y.o.   MRN: [id number]  Reason for Consult: Triage   Referred by Vicci Barnie NOVAK, MD  Subjective:     HPI:  Brandy Stevenson is a 68 y.o. female history of left axillary to bifemoral bypass for aortic occlusion.  She has been walking without limitation but has been concerned due to dark discoloration of her feet more recently.  She does not have any pain and remains asymptomatic continues to walk.  She has recently been treated for breast cancer which she has recovered well from.  She continues on aspirin  and statin.  Past Medical History:  Diagnosis Date   Arthritis    back problems to be evaluated by neurology next month   Cancer [facility]) 2009   L, Breast, radiation and surgery.   COPD (chronic obstructive pulmonary disease) (HCC)    Depression    GERD (gastroesophageal reflux disease)    Hyperlipemia    Hypertension    Leriche syndrome (HCC)    Infrarenal aortic occlusion extending to bilateral internal iliac arteries.  External iliacs fill via collaterals from lumbar arteries to hypogastric/internal iliac arteries -minimal disease on the right side and no disease on the left side leg arteries distally.   Peripheral arterial disease (HCC)    Peripheral vascular disease (HCC) 2013   Pre-diabetes    Family History  Problem Relation Age of Onset   Hyperlipidemia Mother    Hypertension Mother    Heart disease Mother        Atrial Fib.   Hyperlipidemia Sister    Hypertension Sister    Heart disease Sister        Before age 71,  CHF   Heart attack Brother    Hyperlipidemia Brother    Hypertension Brother    Heart disease Brother    Cancer Maternal Uncle        unknown type; mets   Gastric cancer Maternal Uncle    Prostate cancer Maternal Uncle        mets?   Gastric cancer Paternal Grandfather        dx >50   Hypertension Daughter    Breast cancer Cousin 42       maternal female cousin    Breast cancer Cousin        paternal female cousin; dx <50   Past Surgical History:  Procedure Laterality Date   APPENDECTOMY  1980   AXILLARY-FEMORAL BYPASS GRAFT Bilateral 10/29/2021   Procedure: LEFT AXILLOBIFEMORAL BYPASS GRAFT;  Surgeon: Eliza Lonni RAMAN, MD;  Location: [facility] OR;  Service: Vascular;  Laterality: Bilateral;   BREAST BIOPSY Left 05/12/2023   US  LT BREAST BX W LOC DEV 1ST LESION IMG BX SPEC US  GUIDE 05/12/2023 [facility]AMMO[facility]APHY   BREAST BIOPSY  06/09/2023   MM LT RADIOACTIVE SEED LOC MAMMO GUIDE 06/09/2023 [facility]AMMOGRAPHY   BREAST LUMPECTOMY Left    s/p radiation therapy    BREAST LUMPECTOMY WITH RADIOACTIVE SEED LOCALIZATION Left 06/11/2023   Procedure: BREAST LUMPECTOMY WITH RADIOACTIVE SEED LOCALIZATION;  Surgeon: Vanderbilt Ned, MD;  Location: [facility];  Service: General;  Laterality: Left;  LEFT BREAST SEED LUMPECTOMY   BREAST SURGERY     COLONOSCOPY  07/2015   COLONOSCOPY WITH PROPOFOL  N/A 06/26/2015   Procedure: COLONOSCOPY WITH PROPOFOL ;  Surgeon: Gladis MARLA Vicci, MD;  Location: [facility];  Service: Endoscopy;  Laterality: N/A;   CT CTA CORONARY W/CA SCORE W/CM &/OR WO/CM  12/10/2017   Coronary calcium  score is 0.  No evidence of CAD.   PERIPHERAL VASCULAR CATHETERIZATION N/A 10/22/2015   Procedure: Abdominal Aortogram w/Lower Extremity;  Surgeon: Lonni GORMAN Blade, MD;  Location: [facility] INVASIVE CV LAB: Aortic arch widely patent as far as innominate artery, right subclavian artery, right vertebral artery and right, common carotid.  Bilateral single renal arteries - Nonocclusive disease. Infrarenal aorta 100% - bilateral common Iliac 100% --> B Ex Iliacs fill via Int Iliac. Leg OK   TRANSTHORACIC ECHOCARDIOGRAM  08/2017   EF 60-65%. No RWMA.  Normal DF.  Normal valves.    TUBAL LIGATION  1991    Short Social History:  Social History   Tobacco Use   Smoking status: Former    Current packs/day: 0.00    Average packs/day: 0.3 packs/day for  40.0 years (10.0 ttl pk-yrs)    Types: Cigarettes    Start date: 02/08/1977    Quit date: 02/08/2017    Years since quitting: 6.7    Passive exposure: Never   Smokeless tobacco: Never  Substance Use Topics   Alcohol use: No    Allergies  Allergen Reactions   Hydrocodone -Acetaminophen  Nausea And Vomiting and Other (See Comments)    Sweating   Vicodin [Hydrocodone -Acetaminophen ] Nausea And Vomiting and Other (See Comments)    Sweating   Bactrim  [Sulfamethoxazole -Trimethoprim ] Nausea And Vomiting    HA    Bupropion  Other (See Comments)    Made me feel bad and sleepy   Tramadol  Nausea And Vomiting   Umeclidinium Bromide  Other (See Comments)    Dry mouth    Current Outpatient Medications  Medication Sig Dispense Refill   acetaminophen  (TYLENOL ) 500 MG tablet Take 1,000 mg by mouth every 8 (eight) hours.     albuterol  (PROAIR  HFA) 108 (90 Base) MCG/ACT inhaler Inhale 2 puffs into the lungs every 6 (six) hours as needed for wheezing or shortness of breath. 6.7 g 2   amLODipine  (NORVASC ) 10 MG tablet Take 1 tablet (10 mg total) by mouth daily. 90 tablet 1   anastrozole  (ARIMIDEX ) 1 MG tablet Take 1 tablet (1 mg total) by mouth daily. 90 tablet 3   aspirin  EC 81 MG tablet Take 1 tablet (81 mg total) by mouth daily. 90 tablet 3   atorvastatin  (LIPITOR ) 80 MG tablet TAKE 1 TABLET BY MOUTH AT  BEDTIME 100 tablet 2   carvedilol  (COREG ) 6.25 MG tablet Take 1 tablet (6.25 mg total) by mouth 2 (two) times daily. 180 tablet 1   fluticasone -salmeterol (ADVAIR ) 250-50 MCG/ACT AEPB Inhale 1 puff into the lungs in the morning and at bedtime. 60 each 2   gabapentin  (NEURONTIN ) 300 MG capsule TAKE 1 CAPSULE BY MOUTH AT  BEDTIME 100 capsule 2   hydrALAZINE  (APRESOLINE ) 25 MG tablet Take 1 tablet (25 mg total) by mouth 3 (three) times daily. 270 tablet 1   meloxicam  (MOBIC ) 15 MG tablet TAKE 1 TABLET BY MOUTH DAILY 100 tablet 1   Menthol, Topical Analgesic, (BIOFREEZE) 5 % PTCH Apply 0.5 patches  topically daily as needed (pain).     nystatin  (MYCOSTATIN ) 100000 UNIT/ML suspension SWISH AND SWALLOW 5 MLS (500,000 UNITS TOTAL) BY MOUTH 4 (FOUR) TIMES DAILY. 100 mL 0   omeprazole  (PRILOSEC) 40 MG capsule TAKE 1 CAPSULE BY MOUTH DAILY 100 capsule 2   oxyCODONE  (OXY IR/ROXICODONE ) 5 MG immediate release tablet Take 1 tablet (5 mg total) by mouth every 6 (six) hours as needed for severe pain (pain score 7-10). 15 tablet 0  potassium chloride  (KLOR-CON ) 10 MEQ tablet Take 2 tablets (20 mEq total) by mouth daily. 180 tablet 1   spironolactone  (ALDACTONE ) 25 MG tablet Take 1 tablet (25 mg total) by mouth daily. 90 tablet 3   tiotropium (SPIRIVA  HANDIHALER) 18 MCG inhalation capsule Place 1 capsule (18 mcg total) into inhaler and inhale daily. 30 capsule 6   No current facility-administered medications for this visit.    Review of Systems  Constitutional:  Constitutional negative. HENT: HENT negative.  Eyes: Eyes negative.  Respiratory: Respiratory negative.  Cardiovascular: Cardiovascular negative.  GI: Gastrointestinal negative.  Musculoskeletal:       Dark discoloration of feet Skin: Skin negative.  Neurological: Neurological negative. Hematologic: Hematologic/lymphatic negative.  Psychiatric: Psychiatric negative.        Objective:  Objective   Vitals:   10/21/23 1419  BP: 124/77  Pulse: 60  Resp: 20  Temp: 97.7 F (36.5 C)  TempSrc: Temporal  SpO2: 98%  Weight: 199 lb 3.2 oz (90.4 kg)  Height: 5' 5 (1.651 m)   Body mass index is 33.15 kg/m.  Physical Exam HENT:     Head: Normocephalic.     Mouth/Throat:     Mouth: Mucous membranes are moist.  Eyes:     Pupils: Pupils are equal, round, and reactive to light.  Cardiovascular:     Pulses:          Femoral pulses are 2+ on the right side and 2+ on the left side.      Dorsalis pedis pulses are 1+ on the right side and 1+ on the left side.  Abdominal:     General: Abdomen is flat.  Musculoskeletal:         General: Normal range of motion.     Right lower leg: No edema.     Left lower leg: No edema.     Comments: Feet are somewhat darker discolored than her legs bilaterally  Skin:    Capillary Refill: Capillary refill takes less than 2 seconds.  Neurological:     Mental Status: She is alert.     Data: Left Graft #1: Left Ax- Fem bypass graft  +--------------------+--------+---------------+----------+--------+                     PSV cm/sStenosis       Waveform  Comments  +--------------------+--------+---------------+----------+--------+  Inflow             154                    monophasic          +--------------------+--------+---------------+----------+--------+  Proximal Anastomosis143                    monophasic          +--------------------+--------+---------------+----------+--------+  Proximal Graft      135                    monophasic          +--------------------+--------+---------------+----------+--------+  Mid Graft           133                    monophasic          +--------------------+--------+---------------+----------+--------+  Distal Graft        183     50-70% stenosismonophasic          +--------------------+--------+---------------+----------+--------+  Distal Anastomosis  190  50-70% stenosismonophasic          +--------------------+--------+---------------+----------+--------+  Outflow            94                     monophasic          +--------------------+--------+---------------+----------+--------+   Patent left Ax- Fem BPG. Largely monophasic flow is visualized throughout  the graft.   Left Graft #2: Left - right Fem- Fem bypass graft  +--------------------+--------+---------------+----------+--------+                     PSV cm/sStenosis       Waveform  Comments  +--------------------+--------+---------------+----------+--------+  Inflow             94                      monophasic          +--------------------+--------+---------------+----------+--------+  Proximal Anastomosis208     50-70% stenosismonophasic          +--------------------+--------+---------------+----------+--------+  Proximal Graft      99                     monophasic          +--------------------+--------+---------------+----------+--------+  Mid Graft           50                     monophasic          +--------------------+--------+---------------+----------+--------+  Distal Graft        54                     monophasic          +--------------------+--------+---------------+----------+--------+  Distal Anastomosis  108                    monophasic          +--------------------+--------+---------------+----------+--------+  Outflow            83                     monophasic          +--------------------+--------+---------------+----------+--------+   Patent left- right fem-fem BPG. Largely monophasic flow is visualized.  Elevated velocity is seen at the prox anastomosis.    Summary:  Left: Graft #1: Patent left Ax-Fem BPG. Largely monophasic flow is  visualized throughout the graft. The distal graft and distal anastomosis  suggests 50-70% stenosis.   Graft #2: Patent left- right fem-fem BPG. Largely monophasic flow is  visualized. The proximal anastomosis suggests 50-70% stenosis.   ABI Findings:  +---------+------------------+-----+-----------+--------+  Right   Rt Pressure (mmHg)IndexWaveform   Comment   +---------+------------------+-----+-----------+--------+  Brachial 123                    triphasic            +---------+------------------+-----+-----------+--------+  PTA     122               0.99 multiphasic          +---------+------------------+-----+-----------+--------+  DP      123               1.00 multiphasic          +---------+------------------+-----+-----------+--------+  [place]  0.71                      +---------+------------------+-----+-----------+--------+   +---------+------------------+-----+-----------+---------------------------  ----+  Left    Lt Pressure (mmHg)IndexWaveform   Comment                           +---------+------------------+-----+-----------+---------------------------  ----+  Brachial                                   unable to obtain, history  of                                                breast cancer                     +---------+------------------+-----+-----------+---------------------------  ----+  PTA     126               1.02 multiphasic                                  +---------+------------------+-----+-----------+---------------------------  ----+  DP      125               1.02 multiphasic                                  +---------+------------------+-----+-----------+---------------------------  ----+  Great Toe93                0.76                                              +---------+------------------+-----+-----------+---------------------------  ----+   +-------+-----------+-----------+------------+------------+  ABI/TBIToday's ABIToday's TBIPrevious ABIPrevious TBI  +-------+-----------+-----------+------------+------------+  Right 1.00       0.71       0.82        0.66          +-------+-----------+-----------+------------+------------+  Left  1.02       0.76       0.83        0.64          +-------+-----------+-----------+------------+------------+       Bilateral ABIs and TBIs appear increased.    Summary:  Right: Resting right ankle-brachial index is within normal range. The  right toe-brachial index is normal.   Left: Resting left ankle-brachial index is within normal range. The left  toe-brachial index is normal.          Assessment/Plan:     68 year old female with history of axillary bifemoral  bypass for aortic occlusion with evidence of monophasic flow throughout the bypasses secondary to distal anastomotic stenosis of the left common femoral artery.  This was present at her last evaluation as well and she underwent CT angio and this was reassuring.  We again reviewed her CT angio together today and I do not see any issues with the graft other than some mild common femoral disease bilaterally.  She continues to have palpable dorsalis pedis  pulses bilaterally.  We have discussed continuing aspirin  and Plavix and she will follow-up in 1 year with repeat noninvasive studies.     Penne Lonni Colorado MD [facility]

## 2023-10-23 ENCOUNTER — Other Ambulatory Visit: Payer: Self-pay | Admitting: Internal Medicine

## 2023-10-23 ENCOUNTER — Other Ambulatory Visit: Payer: Self-pay

## 2023-10-23 DIAGNOSIS — J449 Chronic obstructive pulmonary disease, unspecified: Secondary | ICD-10-CM

## 2023-10-23 MED ORDER — ALBUTEROL SULFATE HFA 108 (90 BASE) MCG/ACT IN AERS
2.0000 | INHALATION_SPRAY | Freq: Four times a day (QID) | RESPIRATORY_TRACT | 2 refills | Status: DC | PRN
  Filled 2023-10-23: qty 6.7, 25d supply, fill #0
  Filled 2023-11-25: qty 6.7, 25d supply, fill #1
  Filled 2023-12-23: qty 6.7, 25d supply, fill #2

## 2023-10-26 ENCOUNTER — Other Ambulatory Visit: Payer: Self-pay

## 2023-10-27 ENCOUNTER — Ambulatory Visit: Payer: 59 | Attending: Internal Medicine

## 2023-10-27 VITALS — Ht 65.0 in | Wt 199.2 lb

## 2023-10-27 DIAGNOSIS — Z0000 Encounter for general adult medical examination without abnormal findings: Secondary | ICD-10-CM | POA: Diagnosis not present

## 2023-10-27 NOTE — Progress Notes (Signed)
 Because this visit was a virtual/telehealth visit,  certain criteria was not obtained, such a blood pressure, CBG if applicable, and timed get up and go. Any medications not marked as taking were not mentioned during the medication reconciliation part of the visit. Any vitals not documented were not able to be obtained due to this being a telehealth visit or patient was unable to self-report a recent blood pressure reading due to a lack of equipment at home via telehealth. Vitals that have been documented are verbally provided by the patient.   Subjective:   Brandy Stevenson is a 68 y.o. who presents for a Medicare Wellness preventive visit.  As a reminder, Annual Wellness Visits don't include a physical exam, and some assessments may be limited, especially if this visit is performed virtually. We may recommend an in-person follow-up visit with your provider if needed.  Visit Complete: Virtual I connected with  Brandy Stevenson on 10/27/23 by a audio enabled telemedicine application and verified that I am speaking with the correct person using two identifiers.  Patient Location: Home  Provider Location: Office/Clinic  I discussed the limitations of evaluation and management by telemedicine. The patient expressed understanding and agreed to proceed.  Vital Signs: Because this visit was a virtual/telehealth visit, some criteria may be missing or patient reported. Any vitals not documented were not able to be obtained and vitals that have been documented are patient reported.  VideoDeclined- This patient declined Librarian, academic. Therefore the visit was completed with audio only.  Persons Participating in Visit: Patient.  AWV Questionnaire: No: Patient Medicare AWV questionnaire was not completed prior to this visit.  Cardiac Risk Factors include: advanced age (>19men, >40 women);sedentary lifestyle;hypertension;dyslipidemia;family history of  premature cardiovascular disease;obesity (BMI >30kg/m2)     Objective:    Today's Vitals   10/27/23 0914  Weight: 199 lb 3.2 oz (90.4 kg)  Height: 5' 5 (1.651 m)  PainSc: 4   PainLoc: Foot   Body mass index is 33.15 kg/m.     10/27/2023    9:19 AM 09/29/2023   11:39 AM 06/11/2023    6:48 AM 06/05/2023   10:14 AM 10/21/2022   12:20 PM 01/10/2022    9:47 AM 10/29/2021    5:59 AM  Advanced Directives  Does Patient Have a Medical Advance Directive? No No No No No No No  Would patient like information on creating a medical advance directive? No - Patient declined  No - Patient declined No - Patient declined Yes (MAU/Ambulatory/Procedural Areas - Information given) Yes (ED - Information included in AVS) No - Patient declined    Current Medications (verified) Outpatient Encounter Medications as of 10/27/2023  Medication Sig   acetaminophen  (TYLENOL ) 500 MG tablet Take 1,000 mg by mouth every 8 (eight) hours.   albuterol  (PROAIR  HFA) 108 (90 Base) MCG/ACT inhaler Inhale 2 puffs into the lungs every 6 (six) hours as needed for wheezing or shortness of breath.   amLODipine  (NORVASC ) 10 MG tablet Take 1 tablet (10 mg total) by mouth daily.   anastrozole  (ARIMIDEX ) 1 MG tablet Take 1 tablet (1 mg total) by mouth daily.   aspirin  EC 81 MG tablet Take 1 tablet (81 mg total) by mouth daily.   atorvastatin  (LIPITOR ) 80 MG tablet TAKE 1 TABLET BY MOUTH AT  BEDTIME   carvedilol  (COREG ) 6.25 MG tablet Take 1 tablet (6.25 mg total) by mouth 2 (two) times daily.   fluticasone -salmeterol (ADVAIR ) 250-50 MCG/ACT AEPB Inhale 1 puff  into the lungs in the morning and at bedtime.   gabapentin  (NEURONTIN ) 300 MG capsule TAKE 1 CAPSULE BY MOUTH AT  BEDTIME   hydrALAZINE  (APRESOLINE ) 25 MG tablet Take 1 tablet (25 mg total) by mouth 3 (three) times daily.   meloxicam  (MOBIC ) 15 MG tablet TAKE 1 TABLET BY MOUTH DAILY   Menthol, Topical Analgesic, (BIOFREEZE) 5 % PTCH Apply 0.5 patches topically daily as needed  (pain).   nystatin  (MYCOSTATIN ) 100000 UNIT/ML suspension SWISH AND SWALLOW 5 MLS (500,000 UNITS TOTAL) BY MOUTH 4 (FOUR) TIMES DAILY.   omeprazole  (PRILOSEC) 40 MG capsule TAKE 1 CAPSULE BY MOUTH DAILY   oxyCODONE  (OXY IR/ROXICODONE ) 5 MG immediate release tablet Take 1 tablet (5 mg total) by mouth every 6 (six) hours as needed for severe pain (pain score 7-10).   potassium chloride  (KLOR-CON ) 10 MEQ tablet Take 2 tablets (20 mEq total) by mouth daily.   spironolactone  (ALDACTONE ) 25 MG tablet Take 1 tablet (25 mg total) by mouth daily.   tiotropium (SPIRIVA  HANDIHALER) 18 MCG inhalation capsule Place 1 capsule (18 mcg total) into inhaler and inhale daily.   No facility-administered encounter medications on file as of 10/27/2023.    Allergies (verified) Hydrocodone -acetaminophen , Vicodin [hydrocodone -acetaminophen ], Bactrim  [sulfamethoxazole -trimethoprim ], Bupropion , Tramadol , and Umeclidinium bromide    History: Past Medical History:  Diagnosis Date   Arthritis    back problems to be evaluated by neurology next month   Cancer (HCC) 2009   L, Breast, radiation and surgery.   COPD (chronic obstructive pulmonary disease) (HCC)    Depression    GERD (gastroesophageal reflux disease)    Hyperlipemia    Hypertension    Leriche syndrome (HCC)    Infrarenal aortic occlusion extending to bilateral internal iliac arteries.  External iliacs fill via collaterals from lumbar arteries to hypogastric/internal iliac arteries -minimal disease on the right side and no disease on the left side leg arteries distally.   Peripheral arterial disease (HCC)    Peripheral vascular disease (HCC) 2013   Pre-diabetes    Past Surgical History:  Procedure Laterality Date   APPENDECTOMY  1980   AXILLARY-FEMORAL BYPASS GRAFT Bilateral 10/29/2021   Procedure: LEFT AXILLOBIFEMORAL BYPASS GRAFT;  Surgeon: Eliza Lonni RAMAN, MD;  Location: [facility];  Service: Vascular;  Laterality: Bilateral;   BREAST BIOPSY Left  05/12/2023   US  LT BREAST BX W LOC DEV 1ST LESION IMG BX SPEC US  GUIDE 05/12/2023 [facility]   BREAST BIOPSY  06/09/2023   MM LT RADIOACTIVE SEED LOC MAMMO GUIDE 06/09/2023 [facility]   BREAST LUMPECTOMY Left    s/p radiation therapy    BREAST LUMPECTOMY WITH RADIOACTIVE SEED LOCALIZATION Left 06/11/2023   Procedure: BREAST LUMPECTOMY WITH RADIOACTIVE SEED LOCALIZATION;  Surgeon: Vanderbilt Ned, MD;  Location: [facility];  Service: General;  Laterality: Left;  LEFT BREAST SEED LUMPECTOMY   BREAST SURGERY     COLONOSCOPY  07/2015   COLONOSCOPY WITH PROPOFOL  N/A 06/26/2015   Procedure: COLONOSCOPY WITH PROPOFOL ;  Surgeon: Gladis MARLA Louder, MD;  Location: [facility];  Service: Endoscopy;  Laterality: N/A;   CT CTA CORONARY W/CA SCORE W/CM &/OR WO/CM  12/10/2017   Coronary calcium  score is 0.  No evidence of CAD.   PERIPHERAL VASCULAR CATHETERIZATION N/A 10/22/2015   Procedure: Abdominal Aortogram w/Lower Extremity;  Surgeon: Lonni RAMAN Eliza, MD;  Location: [facility] INVASIVE CV LAB: Aortic arch widely patent as far as innominate artery, right subclavian artery, right vertebral artery and right, common carotid.  Bilateral single renal arteries -  Nonocclusive disease. Infrarenal aorta 100% - bilateral common Iliac 100% --> B Ex Iliacs fill via Int Iliac. Leg OK   TRANSTHORACIC ECHOCARDIOGRAM  08/2017   EF 60-65%. No RWMA.  Normal DF.  Normal valves.    TUBAL LIGATION  1991   Family History  Problem Relation Age of Onset   Hyperlipidemia Mother    Hypertension Mother    Heart disease Mother        Atrial Fib.   Hyperlipidemia Sister    Hypertension Sister    Heart disease Sister        Before age 65,  CHF   Heart attack Brother    Hyperlipidemia Brother    Hypertension Brother    Heart disease Brother    Cancer Maternal Uncle        unknown type; mets   Gastric cancer Maternal Uncle    Prostate cancer Maternal Uncle        mets?   Gastric cancer Paternal  Grandfather        dx >50   Hypertension Daughter    Breast cancer Cousin 16       maternal female cousin   Breast cancer Cousin        paternal female cousin; dx <50   Social History   Socioeconomic History   Marital status: Married    Spouse name: Not on file   Number of children: Not on file   Years of education: Not on file   Highest education level: Some college, no degree  Occupational History   Not on file  Tobacco Use   Smoking status: Former    Current packs/day: 0.00    Average packs/day: 0.3 packs/day for 40.0 years (10.0 ttl pk-yrs)    Types: Cigarettes    Start date: 02/08/1977    Quit date: 02/08/2017    Years since quitting: 6.7    Passive exposure: Never   Smokeless tobacco: Never  Vaping Use   Vaping status: Never Used  Substance and Sexual Activity   Alcohol use: No   Drug use: No   Sexual activity: Yes    Birth control/protection: Post-menopausal, Surgical    Comment: BTL  Other Topics Concern   Not on file  Social History Narrative   Not on file   Social Drivers of Health   Financial Resource Strain: Low Risk  (10/27/2023)   Overall Financial Resource Strain (CARDIA)    Difficulty of Paying Living Expenses: Not hard at all  Food Insecurity: No Food Insecurity (10/27/2023)   Hunger Vital Sign    Worried About Running Out of Food in the Last Year: Never true    Ran Out of Food in the Last Year: Never true  Transportation Needs: No Transportation Needs (10/27/2023)   PRAPARE - Administrator, Civil Service (Medical): No    Lack of Transportation (Non-Medical): No  Physical Activity: Inactive (10/27/2023)   Exercise Vital Sign    Days of Exercise per Week: 0 days    Minutes of Exercise per Session: 0 min  Stress: No Stress Concern Present (10/27/2023)   [employer] of Occupational Health - Occupational Stress Questionnaire    Feeling of Stress: Not at all  Recent Concern: Stress - Stress Concern Present (09/28/2023)   [employer] of Occupational Health - Occupational Stress Questionnaire    Feeling of Stress: Rather much  Social Connections: Socially Integrated (10/27/2023)   Social Connection and Isolation Panel    Frequency of Communication with  Friends and Family: More than three times a week    Frequency of Social Gatherings with Friends and Family: Three times a week    Attends Religious Services: More than 4 times per year    Active Member of Clubs or Organizations: Yes    Attends Banker Meetings: 1 to 4 times per year    Marital Status: Married    Tobacco Counseling Counseling given: Not Answered    Clinical Intake:  Pre-visit preparation completed: Yes  Pain : 0-10 Pain Score: 4  (SWELLING, PAINFUL TO BEND FORWARD) Pain Location: Foot Pain Orientation: Right, Left     BMI - recorded: 33.15 Nutritional Status: BMI > 30  Obese Nutritional Risks: None Diabetes: No  Lab Results  Component Value Date   HGBA1C 6.1 06/16/2023   HGBA1C 6.0 08/25/2022   HGBA1C 5.7 (H) 06/14/2021     How often do you need to have someone help you when you read instructions, pamphlets, or other written materials from your doctor or pharmacy?: 1 - Never What is the last grade level you completed in school?: HSG  Interpreter Needed?: No  Information entered by :: Roz Fuller, LPN.   Activities of Daily Living     10/27/2023    9:19 AM 06/11/2023    6:53 AM  In your present state of health, do you have any difficulty performing the following activities:  Hearing? 0 0  Vision? 0 0  Difficulty concentrating or making decisions? 0 0  Comment BSE: GAMES ON PHONE, READING   Walking or climbing stairs? 0   Dressing or bathing? 0   Doing errands, shopping? 0   Preparing Food and eating ? N   Using the Toilet? N   In the past six months, have you accidently leaked urine? N   Do you have problems with loss of bowel control? N   Managing your Medications? N   Managing your Finances?  N   Housekeeping or managing your Housekeeping? N     Patient Care Team: Vicci Barnie NOVAK, MD as PCP - General (Internal Medicine) Anner Alm ORN, MD as PCP - Cardiology (Cardiology) Vanderbilt Ned, MD as Consulting Physician (General Surgery) Loretha Ash, MD as Consulting Physician (Hematology and Oncology) Shannon Agent, MD as Consulting Physician (Radiation Oncology) [facility] as Consulting Physician (Ophthalmology)  I have updated your Care Teams any recent Medical Services you may have received from other providers in the past year.     Assessment:   This is a routine wellness examination for [place].  Hearing/Vision screen Hearing Screening - Comments:: Adequate hearing, no hearing aids. Vision Screening - Comments:: Adequate vision with eyeglasses, up to date with eye exam completed by [facility]    Goals Addressed             This Visit's Progress    10/27/2023: My goal is to get back to physical activity to help increase my energy.         Depression Screen     10/27/2023    9:21 AM 09/29/2023   11:39 AM 09/28/2023    3:34 PM 05/20/2023   11:25 AM 10/21/2022   12:13 PM 08/25/2022   10:58 AM 04/17/2022    1:58 PM  PHQ 2/9 Scores  PHQ - 2 Score 0 0 1 0 1 1 1   PHQ- 9 Score 1  4  4 4 4     Fall Risk     10/27/2023    9:19  AM 09/28/2023    3:34 PM 10/21/2022   12:19 PM 08/25/2022   10:52 AM 04/01/2022    9:55 AM  Fall Risk   Falls in the past year? 0 0 0 0 0  Number falls in past yr: 0 0 0 0 0  Injury with Fall? 0 0 0 0 0  Risk for fall due to : No Fall Risks No Fall Risks No Fall Risks No Fall Risks   Follow up Falls evaluation completed Falls evaluation completed Falls prevention discussed;Education provided;Falls evaluation completed      MEDICARE RISK AT HOME:  Medicare Risk at Home Any stairs in or around the home?: No If so, are there any without handrails?: No Home free of loose throw rugs in walkways, pet beds,  electrical cords, etc?: Yes Adequate lighting in your home to reduce risk of falls?: Yes Life alert?: No Use of a cane, walker or w/c?: No Grab bars in the bathroom?: No Shower chair or bench in shower?: No Elevated toilet seat or a handicapped toilet?: Yes (LIFTER ON TOP)  TIMED UP AND GO:  Was the test performed?  No  Cognitive Function: 6CIT completed    10/27/2023    9:21 AM 01/10/2022    9:48 AM 10/23/2020   10:09 AM  MMSE - Mini Mental State Exam  Not completed: Unable to complete    Orientation to time  5 5  Orientation to Place  5 5  Registration  3 3  Attention/ Calculation  5 5  Recall  3 3  Language- name 2 objects  2 2  Language- repeat  1 1  Language- follow 3 step command  3 3  Language- read & follow direction  1 1  Write a sentence  1 1  Copy design  1 0  Total score  30 29        10/27/2023    9:28 AM 10/21/2022   12:20 PM 01/10/2022    9:48 AM 08/30/2019   10:43 AM  6CIT Screen  What Year? 0 points 0 points 0 points 0 points  What month? 0 points 0 points 0 points 0 points  What time? 0 points 0 points 0 points 0 points  Count back from 20 0 points 0 points 0 points 0 points  Months in reverse 0 points 0 points 0 points 0 points  Repeat phrase 0 points 0 points 0 points 0 points  Total Score 0 points 0 points 0 points 0 points    Immunizations Immunization History  Administered Date(s) Administered   Tdap 12/25/2022    Screening Tests Health Maintenance  Topic Date Due   COVID-19 Vaccine (1) Never done   INFLUENZA VACCINE  10/02/2023   Pneumococcal Vaccine: 50+ Years (1 of 2 - PCV) 12/25/2023 (Originally 10/20/1974)   MAMMOGRAM  04/08/2024   Medicare Annual Wellness (AWV)  10/26/2024   Colonoscopy  12/01/2025   DTaP/Tdap/Td (2 - Td or Tdap) 12/24/2032   DEXA SCAN  Completed   Hepatitis C Screening  Completed   HPV VACCINES  Aged Out   Meningococcal B Vaccine  Aged Out   Zoster Vaccines- Shingrix  Discontinued    Health  Maintenance  Health Maintenance Due  Topic Date Due   COVID-19 Vaccine (1) Never done   INFLUENZA VACCINE  10/02/2023   Health Maintenance Items Addressed: Yes  Additional Screening:  Vision Screening: Recommended annual ophthalmology exams for early detection of glaucoma and other disorders of the eye. Would you  like a referral to an eye doctor? No    Dental Screening: Recommended annual dental exams for proper oral hygiene  Community Resource Referral / Chronic Care Management: CRR required this visit?  No   CCM required this visit?  No   Plan:    I have personally reviewed and noted the following in the patient's chart:   Medical and social history Use of alcohol, tobacco or illicit drugs  Current medications and supplements including opioid prescriptions. Patient is currently taking opioid prescriptions. Information provided to patient regarding non-opioid alternatives. Patient advised to discuss non-opioid treatment plan with their provider. Functional ability and status Nutritional status Physical activity Advanced directives List of other physicians Hospitalizations, surgeries, and ER visits in previous 12 months Vitals Screenings to include cognitive, depression, and falls Referrals and appointments  In addition, I have reviewed and discussed with patient certain preventive protocols, quality metrics, and best practice recommendations. A written personalized care plan for preventive services as well as general preventive health recommendations were provided to patient.   Roz LOISE Fuller, LPN   [id number]   After Visit Summary: (MyChart) Due to this being a telephonic visit, the after visit summary with patients personalized plan was offered to patient via MyChart   Notes: Nothing significant to report at this time.

## 2023-10-29 ENCOUNTER — Other Ambulatory Visit: Payer: Self-pay

## 2023-10-29 ENCOUNTER — Other Ambulatory Visit: Payer: Self-pay | Admitting: Internal Medicine

## 2023-10-30 MED ORDER — NYSTATIN 100000 UNIT/ML MT SUSP
5.0000 mL | Freq: Four times a day (QID) | OROMUCOSAL | 0 refills | Status: DC
  Filled 2023-10-30 – 2023-11-13 (×2): qty 100, 5d supply, fill #0

## 2023-10-31 ENCOUNTER — Other Ambulatory Visit: Payer: Self-pay

## 2023-11-01 ENCOUNTER — Other Ambulatory Visit: Payer: Self-pay

## 2023-11-11 ENCOUNTER — Other Ambulatory Visit: Payer: Self-pay

## 2023-11-12 ENCOUNTER — Other Ambulatory Visit: Payer: Self-pay

## 2023-11-13 ENCOUNTER — Ambulatory Visit: Attending: Internal Medicine | Admitting: Internal Medicine

## 2023-11-13 ENCOUNTER — Encounter: Payer: Self-pay | Admitting: Internal Medicine

## 2023-11-13 ENCOUNTER — Other Ambulatory Visit: Payer: Self-pay

## 2023-11-13 VITALS — BP 125/80 | HR 64 | Ht 65.0 in | Wt 199.8 lb

## 2023-11-13 DIAGNOSIS — I10 Essential (primary) hypertension: Secondary | ICD-10-CM

## 2023-11-13 DIAGNOSIS — R600 Localized edema: Secondary | ICD-10-CM

## 2023-11-13 DIAGNOSIS — Z2821 Immunization not carried out because of patient refusal: Secondary | ICD-10-CM | POA: Diagnosis not present

## 2023-11-13 MED ORDER — HYDRALAZINE HCL 50 MG PO TABS
50.0000 mg | ORAL_TABLET | Freq: Three times a day (TID) | ORAL | 3 refills | Status: AC
  Filled 2023-11-13: qty 270, 90d supply, fill #0
  Filled 2024-02-18: qty 270, 90d supply, fill #1

## 2023-11-13 NOTE — Progress Notes (Signed)
 Patient ID: Brandy Stevenson, female    DOB: 1955/07/26  MRN: [id number]  CC: Foot Swelling (Swelling and tightness in both feet./No to flu vax)   Subjective: Brandy Stevenson is a 68 y.o. female who presents for UC visit Her concerns today include:  Hx of PAD (axillobifemoral bypass graft BL 10/2021 ), HTN, HL, former tob dep, LT breast CA (lumpectomy and XRT 2009, lumpectomy 06/2023 post recurrence), GERD, COPD, Trigeminal neuralgia RT, dep/anxiety, OA shoulders, Vit D def  Discussed the use of AI scribe software for clinical note transcription with the patient, who gave verbal consent to proceed.  History of Present Illness Brandy Stevenson is a 68 year old female with a history of vascular issues who presents with swelling and tightness in both feet.  She has been experiencing swelling and tightness in both feet and lower legs for at least 3 weeks. Initially, she consulted with a vascular specialist who conducted tests and noted a mild blockage that was not concerning. Despite this, the swelling persisted and became painful, particularly around the ankles, with a pulling sensation when trying to straighten her feet.  She has been soaking her feet in Epsom salt and using lavender soap for relaxation. Her current medications BP meds include Spironolactone  25 mg, Coreg  6.25 mg BID, hydralazine  25 mg TID, amlodipine  10 mg daily.  I saw her at the end of July at which time we had increased amlodipine  from 5 mg to 10 mg daily. She denies any recent long-distance travel by plane but mentions traveling by car for about two and a half hours to [facility] for church last weekend, which she does two to three times a month. No increased shortness of breath, chest pain, or pain behind the legs.    Patient Active Problem List   Diagnosis Date Noted   Ductal carcinoma in situ (DCIS) of left breast 05/18/2023   Osteopenia of femoral neck 10/22/2022   Prediabetes 06/15/2021   Influenza  vaccine refused 02/03/2020   Resistant hypertension 01/20/2020   Tubular adenoma of colon 04/27/2019   Cervical radiculopathy 10/01/2018   Hypertensive retinopathy 09/08/2018   Seasonal allergies 06/29/2018   Aortoiliac occlusive disease (HCC) 11/04/2017   DOE (dyspnea on exertion) 11/03/2017   Chronic chest wall pain 11/03/2017   Ichthyosis 10/06/2017   Former smoker 04/07/2017   Primary osteoarthritis of right shoulder 04/07/2017   Chronic midline low back pain without sciatica 04/07/2017   Hiatal hernia 05/01/2016   DJD (degenerative joint disease) of cervical spine 04/17/2016   History of breast cancer 02/18/2016   Foot callus 02/18/2016   Anxiety and depression 05/22/2015   Insomnia 03/15/2015   Vitamin D  deficiency 04/08/2013   Hyperlipidemia with target LDL less than 70 04/08/2013   Atherosclerotic PVD with intermittent claudication (HCC) 03/22/2013   Accelerated hypertension 02/23/2013   GERD (gastroesophageal reflux disease) 02/23/2013   COPD (chronic obstructive pulmonary disease) (HCC) 02/23/2013     Current Outpatient Medications on File Prior to Visit  Medication Sig Dispense Refill   acetaminophen  (TYLENOL ) 500 MG tablet Take 1,000 mg by mouth every 8 (eight) hours.     albuterol  (PROAIR  HFA) 108 (90 Base) MCG/ACT inhaler Inhale 2 puffs into the lungs every 6 (six) hours as needed for wheezing or shortness of breath. 6.7 g 2   amLODipine  (NORVASC ) 10 MG tablet Take 1 tablet (10 mg total) by mouth daily. 90 tablet 1   anastrozole  (ARIMIDEX ) 1 MG tablet Take 1 tablet (1 mg total)  by mouth daily. 90 tablet 3   aspirin  EC 81 MG tablet Take 1 tablet (81 mg total) by mouth daily. 90 tablet 3   atorvastatin  (LIPITOR ) 80 MG tablet TAKE 1 TABLET BY MOUTH AT  BEDTIME 100 tablet 2   carvedilol  (COREG ) 6.25 MG tablet Take 1 tablet (6.25 mg total) by mouth 2 (two) times daily. 180 tablet 1   fluticasone -salmeterol (ADVAIR ) 250-50 MCG/ACT AEPB Inhale 1 puff into the lungs in the  morning and at bedtime. 60 each 2   gabapentin  (NEURONTIN ) 300 MG capsule TAKE 1 CAPSULE BY MOUTH AT  BEDTIME 100 capsule 2   meloxicam  (MOBIC ) 15 MG tablet TAKE 1 TABLET BY MOUTH DAILY 100 tablet 1   Menthol, Topical Analgesic, (BIOFREEZE) 5 % PTCH Apply 0.5 patches topically daily as needed (pain).     omeprazole  (PRILOSEC) 40 MG capsule TAKE 1 CAPSULE BY MOUTH DAILY 100 capsule 2   potassium chloride  (KLOR-CON ) 10 MEQ tablet Take 2 tablets (20 mEq total) by mouth daily. 180 tablet 1   spironolactone  (ALDACTONE ) 25 MG tablet Take 1 tablet (25 mg total) by mouth daily. 90 tablet 3   tiotropium (SPIRIVA  HANDIHALER) 18 MCG inhalation capsule Place 1 capsule (18 mcg total) into inhaler and inhale daily. 30 capsule 6   nystatin  (MYCOSTATIN ) 100000 UNIT/ML suspension SWISH AND SWALLOW 5 MLS (500,000 UNITS TOTAL) BY MOUTH 4 (FOUR) TIMES DAILY. (Patient not taking: Reported on 11/13/2023) 100 mL 0   oxyCODONE  (OXY IR/ROXICODONE ) 5 MG immediate release tablet Take 1 tablet (5 mg total) by mouth every 6 (six) hours as needed for severe pain (pain score 7-10). (Patient not taking: Reported on 11/13/2023) 15 tablet 0   No current facility-administered medications on file prior to visit.    Allergies  Allergen Reactions   Hydrocodone -Acetaminophen  Nausea And Vomiting and Other (See Comments)    Sweating   Vicodin [Hydrocodone -Acetaminophen ] Nausea And Vomiting and Other (See Comments)    Sweating   Bactrim  [Sulfamethoxazole -Trimethoprim ] Nausea And Vomiting    HA    Bupropion  Other (See Comments)    Made me feel bad and sleepy   Tramadol  Nausea And Vomiting   Umeclidinium Bromide  Other (See Comments)    Dry mouth    Social History   Socioeconomic History   Marital status: Married    Spouse name: Not on file   Number of children: Not on file   Years of education: Not on file   Highest education level: Some college, no degree  Occupational History   Not on file  Tobacco Use   Smoking status:  Former    Current packs/day: 0.00    Average packs/day: 0.3 packs/day for 40.0 years (10.0 ttl pk-yrs)    Types: Cigarettes    Start date: 02/08/1977    Quit date: 02/08/2017    Years since quitting: 6.7    Passive exposure: Never   Smokeless tobacco: Never  Vaping Use   Vaping status: Never Used  Substance and Sexual Activity   Alcohol use: No   Drug use: No   Sexual activity: Yes    Birth control/protection: Post-menopausal, Surgical    Comment: BTL  Other Topics Concern   Not on file  Social History Narrative   Not on file   Social Drivers of Health   Financial Resource Strain: Low Risk  (10/27/2023)   Overall Financial Resource Strain (CARDIA)    Difficulty of Paying Living Expenses: Not hard at all  Food Insecurity: No Food Insecurity (10/27/2023)  Hunger Vital Sign    Worried About Running Out of Food in the Last Year: Never true    Ran Out of Food in the Last Year: Never true  Transportation Needs: No Transportation Needs (10/27/2023)   PRAPARE - Administrator, Civil Service (Medical): No    Lack of Transportation (Non-Medical): No  Physical Activity: Inactive (10/27/2023)   Exercise Vital Sign    Days of Exercise per Week: 0 days    Minutes of Exercise per Session: 0 min  Stress: No Stress Concern Present (10/27/2023)   [employer] of Occupational Health - Occupational Stress Questionnaire    Feeling of Stress: Not at all  Recent Concern: Stress - Stress Concern Present (09/28/2023)   [employer] of Occupational Health - Occupational Stress Questionnaire    Feeling of Stress: Rather much  Social Connections: Socially Integrated (10/27/2023)   Social Connection and Isolation Panel    Frequency of Communication with Friends and Family: More than three times a week    Frequency of Social Gatherings with Friends and Family: Three times a week    Attends Religious Services: More than 4 times per year    Active Member of Clubs or Organizations: Yes     Attends Banker Meetings: 1 to 4 times per year    Marital Status: Married  Catering manager Violence: Not At Risk (10/27/2023)   Humiliation, Afraid, Rape, and Kick questionnaire    Fear of Current or Ex-Partner: No    Emotionally Abused: No    Physically Abused: No    Sexually Abused: No    Family History  Problem Relation Age of Onset   Hyperlipidemia Mother    Hypertension Mother    Heart disease Mother        Atrial Fib.   Hyperlipidemia Sister    Hypertension Sister    Heart disease Sister        Before age 79,  CHF   Heart attack Brother    Hyperlipidemia Brother    Hypertension Brother    Heart disease Brother    Cancer Maternal Uncle        unknown type; mets   Gastric cancer Maternal Uncle    Prostate cancer Maternal Uncle        mets?   Gastric cancer Paternal Grandfather        dx >50   Hypertension Daughter    Breast cancer Cousin 22       maternal female cousin   Breast cancer Cousin        paternal female cousin; dx <50    Past Surgical History:  Procedure Laterality Date   APPENDECTOMY  1980   AXILLARY-FEMORAL BYPASS GRAFT Bilateral 10/29/2021   Procedure: LEFT AXILLOBIFEMORAL BYPASS GRAFT;  Surgeon: Eliza Lonni RAMAN, MD;  Location: [facility] OR;  Service: Vascular;  Laterality: Bilateral;   BREAST BIOPSY Left 05/12/2023   US  LT BREAST BX W LOC DEV 1ST LESION IMG BX SPEC US  GUIDE 05/12/2023 [facility]   BREAST BIOPSY  06/09/2023   MM LT RADIOACTIVE SEED LOC MAMMO GUIDE 06/09/2023 [facility]   BREAST LUMPECTOMY Left    s/p radiation therapy    BREAST LUMPECTOMY WITH RADIOACTIVE SEED LOCALIZATION Left 06/11/2023   Procedure: BREAST LUMPECTOMY WITH RADIOACTIVE SEED LOCALIZATION;  Surgeon: Vanderbilt Ned, MD;  Location: [facility];  Service: General;  Laterality: Left;  LEFT BREAST SEED LUMPECTOMY   BREAST SURGERY     COLONOSCOPY  07/2015  COLONOSCOPY WITH PROPOFOL  N/A 06/26/2015   Procedure: COLONOSCOPY WITH  PROPOFOL ;  Surgeon: Gladis MARLA Louder, MD;  Location: [facility];  Service: Endoscopy;  Laterality: N/A;   CT CTA CORONARY W/CA SCORE W/CM &/OR WO/CM  12/10/2017   Coronary calcium  score is 0.  No evidence of CAD.   PERIPHERAL VASCULAR CATHETERIZATION N/A 10/22/2015   Procedure: Abdominal Aortogram w/Lower Extremity;  Surgeon: Lonni GORMAN Blade, MD;  Location: [facility] INVASIVE CV LAB: Aortic arch widely patent as far as innominate artery, right subclavian artery, right vertebral artery and right, common carotid.  Bilateral single renal arteries - Nonocclusive disease. Infrarenal aorta 100% - bilateral common Iliac 100% --> B Ex Iliacs fill via Int Iliac. Leg OK   TRANSTHORACIC ECHOCARDIOGRAM  08/2017   EF 60-65%. No RWMA.  Normal DF.  Normal valves.    TUBAL LIGATION  1991    ROS: Review of Systems Negative except as stated above  PHYSICAL EXAM: BP 125/80   Pulse 64   Ht 5' 5 (1.651 m)   Wt 199 lb 12.8 oz (90.6 kg)   SpO2 99%   BMI 33.25 kg/m   Physical Exam  General appearance - alert, well appearing, and in no distress Mental status - normal mood, behavior, speech, dress, motor activity, and thought processes Chest - clear to auscultation, no wheezes, rales or rhonchi, symmetric air entry Heart - normal rate, regular rhythm, normal S1, S2, no murmurs, rubs, clicks or gallops Extremities - 2+ BL LE and pedal edema      Latest Ref Rng & Units 05/20/2023   11:24 AM 12/25/2022    9:55 AM 10/30/2021    5:30 AM  CMP  Glucose 70 - 99 mg/dL 99  93  885   BUN 8 - 23 mg/dL 13  12  10    Creatinine 0.44 - 1.00 mg/dL 9.20  9.20  9.15   Sodium 135 - 145 mmol/L 139  144  137   Potassium 3.5 - 5.1 mmol/L 4.5  4.2  3.7   Chloride 98 - 111 mmol/L 106  104  100   CO2 22 - 32 mmol/L 30  26  24    Calcium  8.9 - 10.3 mg/dL 9.8  9.3  9.0   Total Protein 6.5 - 8.1 g/dL 7.9  7.3    Total Bilirubin 0.0 - 1.2 mg/dL 0.5  0.3    Alkaline Phos 38 - 126 U/L 78  111    AST 15 - 41 U/L 22  33    ALT 0  - 44 U/L 33  46     Lipid Panel     Component Value Date/Time   CHOL 146 12/25/2022 0955   TRIG 77 12/25/2022 0955   HDL 69 12/25/2022 0955   CHOLHDL 2.1 12/25/2022 0955   CHOLHDL 2.2 10/30/2021 0530   VLDL 10 10/30/2021 0530   LDLCALC 62 12/25/2022 0955    CBC    Component Value Date/Time   WBC 4.2 05/20/2023 1124   WBC 8.2 11/01/2021 1206   RBC 3.85 (L) 05/20/2023 1124   HGB 12.4 05/20/2023 1124   HGB 11.1 12/25/2022 0955   HGB 13.8 06/12/2008 0939   HCT 38.0 05/20/2023 1124   HCT 35.3 12/25/2022 0955   HCT 40.2 06/12/2008 0939   PLT 254 05/20/2023 1124   PLT 256 12/25/2022 0955   MCV 98.7 05/20/2023 1124   MCV 98 (H) 12/25/2022 0955   MCV 90.1 06/12/2008 0939   MCH 32.2 05/20/2023 1124   MCHC  32.6 05/20/2023 1124   RDW 13.2 05/20/2023 1124   RDW 12.7 12/25/2022 0955   RDW 13.9 06/12/2008 0939   LYMPHSABS 1.9 05/20/2023 1124   LYMPHSABS 0.4 (L) 03/10/2017 1335   LYMPHSABS 1.5 06/12/2008 0939   MONOABS 0.7 05/20/2023 1124   MONOABS 0.6 06/12/2008 0939   EOSABS 0.1 05/20/2023 1124   EOSABS 0.1 03/10/2017 1335   BASOSABS 0.1 05/20/2023 1124   BASOSABS 0.0 03/10/2017 1335   BASOSABS 0.0 06/12/2008 0939    ASSESSMENT AND PLAN:  Assessment and Plan Assessment & Plan Bilateral lower extremity edema likely due to increase dose of Amlodipine  on last visit with me end of July - Hold amlodipine . - Increase hydralazine  to 50 mg three times a day. - Instructed to take two 25 mg hydralazine  tablets three times a day until she runs out of current bottle. New rxn sent for the 50 mg tablets to start thereafter.. - Call in one week to report resolution of swelling.  Essential hypertension Blood pressure well-controlled at 125/80 mmHg. Amlodipine  increase improved control but caused edema. So will stop Amlodipine  for now.  Increase hydralazine  to 50 mg 3 times a day.  Continue spironolactone  25 mg daily, carvedilol  6.25 mg twice a day  Patient declined influenza  vaccine     Patient was given the opportunity to ask questions.  Patient verbalized understanding of the plan and was able to repeat key elements of the plan.   This documentation was completed using Paediatric nurse.  Any transcriptional errors are unintentional.  No orders of the defined types were placed in this encounter.    Requested Prescriptions   Signed Prescriptions Disp Refills   hydrALAZINE  (APRESOLINE ) 50 MG tablet 270 tablet 3    Sig: Take 1 tablet (50 mg total) by mouth 3 (three) times daily.    Return if symptoms worsen or fail to improve.  Barnie Louder, MD, FACP

## 2023-11-13 NOTE — Patient Instructions (Signed)
 VISIT SUMMARY:  During your visit, we discussed the swelling and tightness in your feet that you have been experiencing. We reviewed your recent vascular tests and medications, and identified that the increased dose of amlodipine  is likely causing the swelling.  YOUR PLAN:  -BILATERAL LOWER EXTREMITY EDEMA: This means swelling in both of your lower legs, likely due to the increased dose of amlodipine . We will stop the amlodipine  and increase your hydralazine  to 50 mg three times a day. For now, take two 25 mg hydralazine  tablets three times a day until the 50 mg tablets are available. Please call us  in one week to report if the swelling has improved.  -ESSENTIAL HYPERTENSION: This means high blood pressure. Your blood pressure is well-controlled at 125/80 mmHg. The increase in amlodipine  helped control your blood pressure but caused swelling, so we are adjusting your medication to manage both issues.  INSTRUCTIONS:  Please call us  in one week to report if the swelling in your feet has improved.

## 2023-11-18 ENCOUNTER — Other Ambulatory Visit: Payer: Self-pay

## 2023-11-26 ENCOUNTER — Other Ambulatory Visit: Payer: Self-pay

## 2023-12-03 ENCOUNTER — Other Ambulatory Visit: Payer: Self-pay

## 2023-12-03 DIAGNOSIS — I890 Lymphedema, not elsewhere classified: Secondary | ICD-10-CM | POA: Diagnosis not present

## 2023-12-14 ENCOUNTER — Other Ambulatory Visit: Payer: Self-pay

## 2023-12-14 ENCOUNTER — Ambulatory Visit: Attending: Nurse Practitioner | Admitting: Nurse Practitioner

## 2023-12-14 ENCOUNTER — Encounter: Payer: Self-pay | Admitting: Nurse Practitioner

## 2023-12-14 VITALS — BP 163/94 | HR 70 | Resp 19 | Ht 65.0 in | Wt 201.6 lb

## 2023-12-14 DIAGNOSIS — I10 Essential (primary) hypertension: Secondary | ICD-10-CM | POA: Diagnosis not present

## 2023-12-14 DIAGNOSIS — B351 Tinea unguium: Secondary | ICD-10-CM | POA: Diagnosis not present

## 2023-12-14 DIAGNOSIS — M79671 Pain in right foot: Secondary | ICD-10-CM

## 2023-12-14 DIAGNOSIS — M79672 Pain in left foot: Secondary | ICD-10-CM | POA: Diagnosis not present

## 2023-12-14 DIAGNOSIS — M7989 Other specified soft tissue disorders: Secondary | ICD-10-CM

## 2023-12-14 MED ORDER — SPIRONOLACTONE 25 MG PO TABS
37.5000 mg | ORAL_TABLET | Freq: Every day | ORAL | 0 refills | Status: DC
  Filled 2023-12-14: qty 45, 30d supply, fill #0

## 2023-12-14 MED ORDER — DICLOFENAC SODIUM 1 % EX GEL
4.0000 g | Freq: Four times a day (QID) | CUTANEOUS | 0 refills | Status: AC
  Filled 2023-12-14: qty 200, 14d supply, fill #0

## 2023-12-14 NOTE — Progress Notes (Signed)
 Assessment & Plan:  [name] was seen today for foot swelling.  Diagnoses and all orders for this visit:  Essential hypertension -     spironolactone  (ALDACTONE ) 25 MG tablet; Take 1.5 tablets (37.5 mg total) by mouth daily. Blood pressure may be elevated due to amlodipine  discontinuation. Home monitoring needed to assess spironolactone  adjustment. Increasing spironolactone  may aid leg fluid retention. - Recheck blood pressure in office. - Encourage home blood pressure monitoring. - Consider increasing spironolactone  to 50 mg if blood pressure remains elevated.  Onychomycosis -     Ambulatory referral to Podiatry Toenail fungus present on left great toe. - Refer to foot doctor for toenail fungus treatment and nail trimming.   Leg swelling Mild edema which is non pitting on exam today.  Possibly related to hormonal therapy  (arimidex )  - Recommend wearing compression socks. - Advise elevating legs when sitting for more than 30 minutes. - Encourage drinking at least five bottles of water  daily.   Pain in both feet -     diclofenac  Sodium (VOLTAREN ) 1 % GEL; Apply 4 g topically 4 (four) times daily. To both feet Tightness and discomfort in foot likely due to tendinitis. - Prescribe ibuprofen  gel for topical application. - Recommend stretching exercises for the foot.    Patient has been counseled on age-appropriate routine health concerns for screening and prevention. These are reviewed and up-to-date. Referrals have been placed accordingly. Immunizations are up-to-date or declined.    Subjective:   Chief Complaint  Patient presents with   Foot Swelling    Left and right foot.    History of Present Illness Brandy Stevenson is a 68 year old female with hypertension and a history of breast cancer who presents with concerns of leg swelling and elevated blood pressure.  She is a patient of Dr. Vicci  Hx of PAD (axillobifemoral bypass graft BL 10/2021 ), HTN, HL,  former tob dep, LT breast CA (lumpectomy and XRT 2009, lumpectomy 06/2023 post recurrence), GERD, COPD, Trigeminal neuralgia RT, dep/anxiety, OA shoulders, Vit D def   She has been experiencing leg swelling, which became noticeable AFTER discontinuing amlodipine . The swelling is more pronounced in the evenings and is accompanied by a feeling of tightness in the front of her foot. She manages the swelling with compression socks and leg elevation. Associated symptoms include bilateral lateral foot pain. There is also hyperpigmentation of skin on feet. No evidence of compromised circulation on exam.   Her history of breast cancer, which recurred in April, has led to her current hormonal therapy. She has been on this therapy for approximately three months and notes that her leg swelling started shortly thereafter.. She reports that her vascular surgeon told her the swelling was not related to any vascular disorder at this time.    Her blood pressure has been elevated, which she attributes to not taking her medication on a timely basis.   She was previously on amlodipine , which was discontinued, and she has not been regularly monitoring her blood pressure at home. She is currently prescribed carvedilol  6.25 mg BID, hydralazine  50mg  TID and spironolactone   BP Readings from Last 3 Encounters:  12/15/23 (!) 150/92  12/14/23 (!) 163/94  11/13/23 125/80     She has a history of COPD and uses albuterol , Ventolin , and Spiriva  inhalers. She reports a voice change with Spiriva , which she uses less frequently. She experiences shortness of breath after physical activity but feels she manages well given her COPD history.  Review of Systems  Constitutional:  Negative for fever, malaise/fatigue and weight loss.  HENT: Negative.  Negative for nosebleeds.   Eyes: Negative.  Negative for blurred vision, double vision and photophobia.  Respiratory: Negative.  Negative for cough and shortness of breath.    Cardiovascular:  Positive for leg swelling. Negative for chest pain and palpitations.  Gastrointestinal: Negative.  Negative for heartburn, nausea and vomiting.  Musculoskeletal:  Positive for joint pain. Negative for myalgias.  Neurological: Negative.  Negative for dizziness, focal weakness, seizures and headaches.  Psychiatric/Behavioral: Negative.  Negative for suicidal ideas.     Past Medical History:  Diagnosis Date   Arthritis    back problems to be evaluated by neurology next month   Cancer [facility]) 2009   L, Breast, radiation and surgery.   COPD (chronic obstructive pulmonary disease) (HCC)    Depression    GERD (gastroesophageal reflux disease)    Hyperlipemia    Hypertension    Leriche syndrome (HCC)    Infrarenal aortic occlusion extending to bilateral internal iliac arteries.  External iliacs fill via collaterals from lumbar arteries to hypogastric/internal iliac arteries -minimal disease on the right side and no disease on the left side leg arteries distally.   Peripheral arterial disease    Peripheral vascular disease 2013   Pre-diabetes     Past Surgical History:  Procedure Laterality Date   APPENDECTOMY  1980   AXILLARY-FEMORAL BYPASS GRAFT Bilateral 10/29/2021   Procedure: LEFT AXILLOBIFEMORAL BYPASS GRAFT;  Surgeon: Eliza Lonni RAMAN, MD;  Location: [facility] OR;  Service: Vascular;  Laterality: Bilateral;   BREAST BIOPSY Left 05/12/2023   US  LT BREAST BX W LOC DEV 1ST LESION IMG BX SPEC US  GUIDE 05/12/2023 [facility] MAMMOGRAPHY   BREAST BIOPSY  06/09/2023   MM LT RADIOACTIVE SEED LOC MAMMO GUIDE 06/09/2023 [facility]AMMOGRAPHY   BREAST LUMPECTOMY Left    s/p radiation therapy    BREAST LUMPECTOMY WITH RADIOACTIVE SEED LOCALIZATION Left 06/11/2023   Procedure: BREAST LUMPECTOMY WITH RADIOACTIVE SEED LOCALIZATION;  Surgeon: Vanderbilt Ned, MD;  Location: [facility];  Service: General;  Laterality: Left;  LEFT BREAST SEED LUMPECTOMY   BREAST SURGERY      COLONOSCOPY  07/2015   COLONOSCOPY WITH PROPOFOL  N/A 06/26/2015   Procedure: COLONOSCOPY WITH PROPOFOL ;  Surgeon: Gladis MARLA Louder, MD;  Location: [facility];  Service: Endoscopy;  Laterality: N/A;   CT CTA CORONARY W/CA SCORE W/CM &/OR WO/CM  12/10/2017   Coronary calcium  score is 0.  No evidence of CAD.   PERIPHERAL VASCULAR CATHETERIZATION N/A 10/22/2015   Procedure: Abdominal Aortogram w/Lower Extremity;  Surgeon: Lonni RAMAN Eliza, MD;  Location: [facility] INVASIVE CV LAB: Aortic arch widely patent as far as innominate artery, right subclavian artery, right vertebral artery and right, common carotid.  Bilateral single renal arteries - Nonocclusive disease. Infrarenal aorta 100% - bilateral common Iliac 100% --> B Ex Iliacs fill via Int Iliac. Leg OK   TRANSTHORACIC ECHOCARDIOGRAM  08/2017   EF 60-65%. No RWMA.  Normal DF.  Normal valves.    TUBAL LIGATION  1991    Family History  Problem Relation Age of Onset   Hyperlipidemia Mother    Hypertension Mother    Heart disease Mother        Atrial Fib.   Hyperlipidemia Sister    Hypertension Sister    Heart disease Sister        Before age 14,  CHF   Heart attack Brother    Hyperlipidemia Brother  Hypertension Brother    Heart disease Brother    Cancer Maternal Uncle        unknown type; mets   Gastric cancer Maternal Uncle    Prostate cancer Maternal Uncle        mets?   Gastric cancer Paternal Grandfather        dx >50   Hypertension Daughter    Breast cancer Cousin 52       maternal female cousin   Breast cancer Cousin        paternal female cousin; dx <50    Social History Reviewed with no changes to be made today.   Outpatient Medications Prior to Visit  Medication Sig Dispense Refill   acetaminophen  (TYLENOL ) 500 MG tablet Take 1,000 mg by mouth every 8 (eight) hours.     albuterol  (PROAIR  HFA) 108 (90 Base) MCG/ACT inhaler Inhale 2 puffs into the lungs every 6 (six) hours as needed for wheezing or shortness of  breath. 6.7 g 2   anastrozole  (ARIMIDEX ) 1 MG tablet Take 1 tablet (1 mg total) by mouth daily. 90 tablet 3   aspirin  EC 81 MG tablet Take 1 tablet (81 mg total) by mouth daily. 90 tablet 3   atorvastatin  (LIPITOR ) 80 MG tablet TAKE 1 TABLET BY MOUTH AT  BEDTIME 100 tablet 2   carvedilol  (COREG ) 6.25 MG tablet Take 1 tablet (6.25 mg total) by mouth 2 (two) times daily. 180 tablet 1   gabapentin  (NEURONTIN ) 300 MG capsule TAKE 1 CAPSULE BY MOUTH AT  BEDTIME 100 capsule 2   hydrALAZINE  (APRESOLINE ) 50 MG tablet Take 1 tablet (50 mg total) by mouth 3 (three) times daily. 270 tablet 3   meloxicam  (MOBIC ) 15 MG tablet TAKE 1 TABLET BY MOUTH DAILY 100 tablet 1   Menthol, Topical Analgesic, (BIOFREEZE) 5 % PTCH Apply 0.5 patches topically daily as needed (pain).     omeprazole  (PRILOSEC) 40 MG capsule TAKE 1 CAPSULE BY MOUTH DAILY 100 capsule 2   potassium chloride  (KLOR-CON ) 10 MEQ tablet Take 2 tablets (20 mEq total) by mouth daily. 180 tablet 1   tiotropium (SPIRIVA  HANDIHALER) 18 MCG inhalation capsule Place 1 capsule (18 mcg total) into inhaler and inhale daily. 30 capsule 6   fluticasone -salmeterol (ADVAIR ) 250-50 MCG/ACT AEPB Inhale 1 puff into the lungs in the morning and at bedtime. 60 each 2   spironolactone  (ALDACTONE ) 25 MG tablet Take 1 tablet (25 mg total) by mouth daily. 90 tablet 3   amLODipine  (NORVASC ) 10 MG tablet Take 1 tablet (10 mg total) by mouth daily. (Patient not taking: Reported on 12/14/2023) 90 tablet 1   nystatin  (MYCOSTATIN ) 100000 UNIT/ML suspension SWISH AND SWALLOW 5 MLS (500,000 UNITS TOTAL) BY MOUTH 4 (FOUR) TIMES DAILY. (Patient not taking: Reported on 12/14/2023) 100 mL 0   oxyCODONE  (OXY IR/ROXICODONE ) 5 MG immediate release tablet Take 1 tablet (5 mg total) by mouth every 6 (six) hours as needed for severe pain (pain score 7-10). (Patient not taking: Reported on 12/14/2023) 15 tablet 0   No facility-administered medications prior to visit.    Allergies  Allergen  Reactions   Hydrocodone -Acetaminophen  Nausea And Vomiting and Other (See Comments)    Sweating   Vicodin [Hydrocodone -Acetaminophen ] Nausea And Vomiting and Other (See Comments)    Sweating   Bactrim  [Sulfamethoxazole -Trimethoprim ] Nausea And Vomiting    HA    Bupropion  Other (See Comments)    Made me feel bad and sleepy   Tramadol  Nausea And Vomiting   Umeclidinium  Bromide Other (See Comments)    Dry mouth       Objective:    BP (!) 163/94 (BP Location: Left Arm, Patient Position: Sitting, Cuff Size: Large)   Pulse 70   Resp 19   Ht 5' 5 (1.651 m)   Wt 201 lb 9.6 oz (91.4 kg)   SpO2 98%   BMI 33.55 kg/m  Wt Readings from Last 3 Encounters:  12/15/23 201 lb (91.2 kg)  12/14/23 201 lb 9.6 oz (91.4 kg)  11/13/23 199 lb 12.8 oz (90.6 kg)    Physical Exam Vitals and nursing note reviewed.  Constitutional:      Appearance: She is well-developed.  HENT:     Head: Normocephalic and atraumatic.  Cardiovascular:     Rate and Rhythm: Normal rate and regular rhythm.     Heart sounds: Normal heart sounds. No murmur heard.    No friction rub. No gallop.  Pulmonary:     Effort: Pulmonary effort is normal. No tachypnea or respiratory distress.     Breath sounds: Normal breath sounds. No decreased breath sounds, wheezing, rhonchi or rales.  Chest:     Chest wall: No tenderness.  Musculoskeletal:        General: Normal range of motion.     Cervical back: Normal range of motion.     Right lower leg: No edema.     Left lower leg: No edema.  Feet:     Right foot:     Skin integrity: Dry skin present.     Toenail Condition: Fungal disease present.    Left foot:     Skin integrity: Dry skin present.     Toenail Condition: Fungal disease present. Skin:    General: Skin is warm and dry.  Neurological:     Mental Status: She is alert and oriented to person, place, and time.     Coordination: Coordination normal.  Psychiatric:        Behavior: Behavior normal. Behavior is  cooperative.        Thought Content: Thought content normal.        Judgment: Judgment normal.          Patient has been counseled extensively about nutrition and exercise as well as the importance of adherence with medications and regular follow-up. The patient was given clear instructions to go to ER or return to medical center if symptoms don't improve, worsen or new problems develop. The patient verbalized understanding.   Follow-up: Return in about 2 weeks (around 12/28/2023) for BP check with nurse/BMP.   Haze LELON Servant, FNP-BC [facility] [place], [place] [phone]   12/25/2023, 1:43 PM

## 2023-12-15 ENCOUNTER — Encounter: Payer: Self-pay | Admitting: Emergency Medicine

## 2023-12-15 ENCOUNTER — Ambulatory Visit: Payer: Self-pay

## 2023-12-15 ENCOUNTER — Ambulatory Visit
Admission: EM | Admit: 2023-12-15 | Discharge: 2023-12-15 | Disposition: A | Discharge status: Home / Self Care | Attending: Family Medicine | Admitting: Family Medicine

## 2023-12-15 DIAGNOSIS — I10 Essential (primary) hypertension: Secondary | ICD-10-CM

## 2023-12-15 MED ORDER — CLONIDINE HCL 0.1 MG PO TABS
0.1000 mg | ORAL_TABLET | Freq: Once | ORAL | Status: AC
  Administered 2023-12-15: 0.1 mg via ORAL

## 2023-12-15 NOTE — Discharge Instructions (Signed)
 Your blood pressure was noted to be elevated during your visit today. If you are currently taking medication for high blood pressure, please ensure you are taking this as directed. If you do not have a history of high blood pressure and your blood pressure remains persistently elevated, you may need to begin taking a medication at some point. You may return here within the next few days to recheck if unable to see your primary care provider or if you do not have a one.  BP (!) 150/92 (BP Location: Left Arm)   Pulse 66   Temp 98.4 F (36.9 C) (Oral)   Resp 18   Ht 5' 5 (1.651 m)   Wt 91.2 kg   SpO2 96%   BMI 33.45 kg/m   BP Readings from Last 3 Encounters:  12/15/23 (!) 150/92  12/14/23 (!) 163/94  11/13/23 125/80   Meds ordered this encounter  Medications   cloNIDine  (CATAPRES ) tablet 0.1 mg

## 2023-12-15 NOTE — ED Triage Notes (Signed)
 Pt st's she has had HTN all day today  Pt called her provider and spoke with nurse who instructed her to take another HTN med   Pt st's she took the meds but blood pressure remained high  Pt st's her spirolactone was increased yesterday

## 2023-12-15 NOTE — Telephone Encounter (Signed)
 Patient called reporting: Last BP reading 192/95 at 2 PM, took Hydralazine  50 mg took at 3 PM, Last BP 5 minutes ago 186/90 70.  Nurse instructed patient to recheck BP, new reading 189/93 HR 67. Auto BP monitor, arm.  Patient reports slight HA 3/10  Denies blurred vision, diff breath, weakness/numbness, chest pain.  Advised UC/ED. Patient reports going to [facility] now.

## 2023-12-15 NOTE — Telephone Encounter (Signed)
 Patient was seen yesterday and had Spironolactone  increased to 37.5 mg which is 1.5 tablets. Patient reports high BP readings today. 182/91, 184/94 earlier today. 192/95 which was on the phone with Nurse Triage. Patient endorses that she had only take 25 mg of Spironolactone  today-didn't take a half tablet. Patient is encouraged to take the half tablet she needs to complete the new dose of Spironolactone  and to take her next dose of hydralazine  which she is due for per her schedule. Patient takes the medication while on the phone with Nurse Triage. Patient is instructed to call back to Nurse Triage at 4:00 PM to report of blood pressure readings at that time. Patient is educated about blood pressure medication and the time needed to get a full effect on the readings. Patient is encouraged to rest-states she will call back at 4:00 PM.  FYI Only or Action Required?: Action required by provider: patient to call back with BP readings at 4:00 PM.  Patient was last seen in primary care on 12/14/2023 by Theotis Haze ORN, NP.  Called Nurse Triage reporting Hypertension.  Symptoms began today.  Interventions attempted: Prescription medications: hydralazine , Coreg , Spironolactone  and Rest, hydration, or home remedies.  Symptoms are: unchanged.  Triage Disposition: Urgent Home Treatment With Follow-up Call  Patient/caregiver understands and will follow disposition?: Yes  Copied from CRM #[id number]. Topic: Clinical - Red Word Triage >> Dec 15, 2023  2:18 PM Winona R wrote: High BP 8:27am 182/91 11am 184/94 after medication. Came in yesterday for an acute appointment where her rx spironolactone  (ALDACTONE ) 25 MG tablet was increased from Take 1 tablet (25 mg total) by mouth daily to 1.5 tablets (37.5 mg total) by mouth daily however it is still high no more swelling in her feet. Reason for Disposition  [1] Systolic BP >= 180 OR Diastolic >= 110 AND [2] missed most recent dose of blood pressure  medication  Answer Assessment - Initial Assessment Questions 1. BLOOD PRESSURE: What is your blood pressure? Did you take at least two measurements 5 minutes apart?     184/94           192/95 2. ONSET: When did you take your blood pressure?     While on the phone 3. HOW: How did you take your blood pressure? (e.g., automatic home BP monitor, visiting nurse)     Automatic home BP monitor 4. HISTORY: Do you have a history of high blood pressure?     yes 5. MEDICINES: Are you taking any medicines for blood pressure? Have you missed any doses recently?     Yes. Due for next dose of hydralazine  and needs to take 1/2 tablet of Spirolactone.  6. OTHER SYMPTOMS: Do you have any symptoms? (e.g., blurred vision, chest pain, difficulty breathing, headache, weakness)     headache  Protocols used: Blood Pressure - High-A-AH

## 2023-12-15 NOTE — Telephone Encounter (Signed)
 Patient previously called and was triaged. Continuation of documentation was placed in previous CRM.  Copied from CRM [id number]. Topic: Clinical - Red Word Triage >> Dec 15, 2023  4:36 PM Selinda RAMAN wrote: Red Word that prompted transfer to Nurse Triage: The patient called in stating she has been talking with a nurse throughout the day about her blood pressure. It has been very high even on medication. She took her meds around 2:20 as directed and the most recent reading is 186/90 still. I will transfer her back to [place] [facility]

## 2023-12-16 NOTE — ED Provider Notes (Signed)
 [facility]   [id number] 12/15/23 Arrival Time: 1710  ASSESSMENT & PLAN:  1. Elevated blood pressure reading with diagnosis of hypertension    HA has resolved. Will check her BP at home before taking evening BP meds.  Meds ordered this encounter  Medications   cloNIDine  (CATAPRES ) tablet 0.1 mg   Recheck BP 150/92.   Follow-up Information     Schedule an appointment as soon as possible for a visit  with Vicci Barnie NOVAK, MD.   Specialty: Internal Medicine Why: To recheck your blood pressure. Contact information: [address] [place] [address] [phone]                 Reviewed expectations re: course of current medical issues. Questions answered. Outlined signs and symptoms indicating need for more acute intervention. Patient verbalized understanding. After Visit Summary given.   SUBJECTIVE:  Brandy Stevenson is a 68 y.o. female who presents with concerns regarding increased blood pressures. Reports that she is treated for BP.  She reports taking medications as instructed.  Denies symptoms of chest pain, palpations, orthopnea, nocturnal dyspnea, or LE edema. With mild HA upon arrival here.  Social History   Tobacco Use  Smoking Status Former   Current packs/day: 0.00   Average packs/day: 0.3 packs/day for 40.0 years (10.0 ttl pk-yrs)   Types: Cigarettes   Start date: 02/08/1977   Quit date: 02/08/2017   Years since quitting: 6.8   Passive exposure: Never  Smokeless Tobacco Never    OBJECTIVE:  Vitals:   12/15/23 1736 12/15/23 1737 12/15/23 1908  BP: (!) 184/84  (!) 150/92  Pulse: 66    Resp: 18    Temp: 98.4 F (36.9 C)    TempSrc: Oral    SpO2: 96%    Weight:  91.2 kg   Height:  5' 5 (1.651 m)     General appearance: alert; no distress Eyes: PERRLA; EOMI HENT: normocephalic; atraumatic Neck: supple Lungs: clear to auscultation bilaterally CV: RRR Abdomen: soft, non-tender; bowel sounds  normal Extremities: no edema; symmetrical with no gross deformities Skin: warm and dry Psychological: alert and cooperative; normal mood and affect  ECG: Orders placed or performed in visit on 09/11/22   EKG 12-Lead   Labs:  Labs Reviewed - No data to display  Imaging: No results found.  Allergies  Allergen Reactions   Hydrocodone -Acetaminophen  Nausea And Vomiting and Other (See Comments)    Sweating   Vicodin [Hydrocodone -Acetaminophen ] Nausea And Vomiting and Other (See Comments)    Sweating   Bactrim  [Sulfamethoxazole -Trimethoprim ] Nausea And Vomiting    HA    Bupropion  Other (See Comments)    Made me feel bad and sleepy   Tramadol  Nausea And Vomiting   Umeclidinium Bromide  Other (See Comments)    Dry mouth    Past Medical History:  Diagnosis Date   Arthritis    back problems to be evaluated by neurology next month   Cancer (HCC) 2009   L, Breast, radiation and surgery.   COPD (chronic obstructive pulmonary disease) (HCC)    Depression    GERD (gastroesophageal reflux disease)    Hyperlipemia    Hypertension    Leriche syndrome (HCC)    Infrarenal aortic occlusion extending to bilateral internal iliac arteries.  External iliacs fill via collaterals from lumbar arteries to hypogastric/internal iliac arteries -minimal disease on the right side and no disease on the left side leg arteries distally.   Peripheral arterial disease  Peripheral vascular disease 2013   Pre-diabetes    Social History   Socioeconomic History   Marital status: Married    Spouse name: Not on file   Number of children: Not on file   Years of education: Not on file   Highest education level: Some college, no degree  Occupational History   Not on file  Tobacco Use   Smoking status: Former    Current packs/day: 0.00    Average packs/day: 0.3 packs/day for 40.0 years (10.0 ttl pk-yrs)    Types: Cigarettes    Start date: 02/08/1977    Quit date: 02/08/2017    Years since quitting: 6.8     Passive exposure: Never   Smokeless tobacco: Never  Vaping Use   Vaping status: Never Used  Substance and Sexual Activity   Alcohol use: No   Drug use: No   Sexual activity: Yes    Birth control/protection: Post-menopausal, Surgical    Comment: BTL  Other Topics Concern   Not on file  Social History Narrative   Not on file   Social Drivers of Health   Financial Resource Strain: Low Risk  (10/27/2023)   Overall Financial Resource Strain (CARDIA)    Difficulty of Paying Living Expenses: Not hard at all  Food Insecurity: No Food Insecurity (10/27/2023)   Hunger Vital Sign    Worried About Running Out of Food in the Last Year: Never true    Ran Out of Food in the Last Year: Never true  Transportation Needs: No Transportation Needs (10/27/2023)   PRAPARE - Administrator, Civil Service (Medical): No    Lack of Transportation (Non-Medical): No  Physical Activity: Inactive (10/27/2023)   Exercise Vital Sign    Days of Exercise per Week: 0 days    Minutes of Exercise per Session: 0 min  Stress: No Stress Concern Present (10/27/2023)   [employer] of Occupational Health - Occupational Stress Questionnaire    Feeling of Stress: Not at all  Recent Concern: Stress - Stress Concern Present (09/28/2023)   [employer] of Occupational Health - Occupational Stress Questionnaire    Feeling of Stress: Rather much  Social Connections: Socially Integrated (10/27/2023)   Social Connection and Isolation Panel    Frequency of Communication with Friends and Family: More than three times a week    Frequency of Social Gatherings with Friends and Family: Three times a week    Attends Religious Services: More than 4 times per year    Active Member of Clubs or Organizations: Yes    Attends Banker Meetings: 1 to 4 times per year    Marital Status: Married  Catering manager Violence: Not At Risk (10/27/2023)   Humiliation, Afraid, Rape, and Kick questionnaire    Fear  of Current or Ex-Partner: No    Emotionally Abused: No    Physically Abused: No    Sexually Abused: No   Family History  Problem Relation Age of Onset   Hyperlipidemia Mother    Hypertension Mother    Heart disease Mother        Atrial Fib.   Hyperlipidemia Sister    Hypertension Sister    Heart disease Sister        Before age 29,  CHF   Heart attack Brother    Hyperlipidemia Brother    Hypertension Brother    Heart disease Brother    Cancer Maternal Uncle        unknown type; mets  Gastric cancer Maternal Uncle    Prostate cancer Maternal Uncle        mets?   Gastric cancer Paternal Grandfather        dx >50   Hypertension Daughter    Breast cancer Cousin 36       maternal female cousin   Breast cancer Cousin        paternal female cousin; dx <50   Past Surgical History:  Procedure Laterality Date   APPENDECTOMY  1980   AXILLARY-FEMORAL BYPASS GRAFT Bilateral 10/29/2021   Procedure: LEFT AXILLOBIFEMORAL BYPASS GRAFT;  Surgeon: Eliza Lonni RAMAN, MD;  Location: [facility];  Service: Vascular;  Laterality: Bilateral;   BREAST BIOPSY Left 05/12/2023   US  LT BREAST BX W LOC DEV 1ST LESION IMG BX SPEC US  GUIDE 05/12/2023 [facility] MAMMOGRAPHY   BREAST BIOPSY  06/09/2023   MM LT RADIOACTIVE SEED LOC MAMMO GUIDE 06/09/2023 [facility]AMMOGRAPHY   BREAST LUMPECTOMY Left    s/p radiation therapy    BREAST LUMPECTOMY WITH RADIOACTIVE SEED LOCALIZATION Left 06/11/2023   Procedure: BREAST LUMPECTOMY WITH RADIOACTIVE SEED LOCALIZATION;  Surgeon: Vanderbilt Ned, MD;  Location: [facility];  Service: General;  Laterality: Left;  LEFT BREAST SEED LUMPECTOMY   BREAST SURGERY     COLONOSCOPY  07/2015   COLONOSCOPY WITH PROPOFOL  N/A 06/26/2015   Procedure: COLONOSCOPY WITH PROPOFOL ;  Surgeon: Gladis MARLA Louder, MD;  Location: [facility];  Service: Endoscopy;  Laterality: N/A;   CT CTA CORONARY W/CA SCORE W/CM &/OR WO/CM  12/10/2017   Coronary calcium  score is 0.  No evidence of  CAD.   PERIPHERAL VASCULAR CATHETERIZATION N/A 10/22/2015   Procedure: Abdominal Aortogram w/Lower Extremity;  Surgeon: Lonni RAMAN Eliza, MD;  Location: [facility] INVASIVE CV LAB: Aortic arch widely patent as far as innominate artery, right subclavian artery, right vertebral artery and right, common carotid.  Bilateral single renal arteries - Nonocclusive disease. Infrarenal aorta 100% - bilateral common Iliac 100% --> B Ex Iliacs fill via Int Iliac. Leg OK   TRANSTHORACIC ECHOCARDIOGRAM  08/2017   EF 60-65%. No RWMA.  Normal DF.  Normal valves.    TUBAL LIGATION  1991       Rolinda Rogue, MD 12/16/23 [phone]

## 2023-12-21 ENCOUNTER — Other Ambulatory Visit: Payer: Self-pay | Admitting: Internal Medicine

## 2023-12-21 DIAGNOSIS — J449 Chronic obstructive pulmonary disease, unspecified: Secondary | ICD-10-CM

## 2023-12-22 ENCOUNTER — Other Ambulatory Visit: Payer: Self-pay

## 2023-12-22 MED ORDER — FLUTICASONE-SALMETEROL 250-50 MCG/ACT IN AEPB
1.0000 | INHALATION_SPRAY | Freq: Two times a day (BID) | RESPIRATORY_TRACT | 2 refills | Status: DC
  Filled 2023-12-22: qty 60, 30d supply, fill #0
  Filled 2024-01-24: qty 60, 30d supply, fill #1
  Filled 2024-02-23: qty 60, 30d supply, fill #2

## 2023-12-23 ENCOUNTER — Other Ambulatory Visit: Payer: Self-pay

## 2023-12-25 ENCOUNTER — Encounter: Payer: Self-pay | Admitting: Nurse Practitioner

## 2023-12-28 ENCOUNTER — Ambulatory Visit

## 2023-12-30 ENCOUNTER — Other Ambulatory Visit: Payer: Self-pay

## 2024-01-04 ENCOUNTER — Other Ambulatory Visit: Payer: Self-pay | Admitting: Nurse Practitioner

## 2024-01-04 ENCOUNTER — Other Ambulatory Visit: Payer: Self-pay

## 2024-01-04 ENCOUNTER — Other Ambulatory Visit: Payer: Self-pay | Admitting: Internal Medicine

## 2024-01-04 DIAGNOSIS — I10 Essential (primary) hypertension: Secondary | ICD-10-CM

## 2024-01-04 MED ORDER — SPIRONOLACTONE 25 MG PO TABS
37.5000 mg | ORAL_TABLET | Freq: Every day | ORAL | 1 refills | Status: DC
  Filled 2024-01-04 – 2024-01-07 (×2): qty 135, 90d supply, fill #0

## 2024-01-04 MED ORDER — POTASSIUM CHLORIDE ER 10 MEQ PO TBCR
20.0000 meq | EXTENDED_RELEASE_TABLET | Freq: Every day | ORAL | 1 refills | Status: DC
  Filled 2024-01-04: qty 180, 90d supply, fill #0

## 2024-01-05 ENCOUNTER — Other Ambulatory Visit: Payer: Self-pay

## 2024-01-07 ENCOUNTER — Ambulatory Visit: Admitting: Podiatry

## 2024-01-07 ENCOUNTER — Other Ambulatory Visit: Payer: Self-pay

## 2024-01-13 ENCOUNTER — Ambulatory Visit: Admitting: Podiatry

## 2024-01-21 ENCOUNTER — Telehealth: Payer: Self-pay

## 2024-01-21 NOTE — Telephone Encounter (Signed)
 Copied from CRM #[id number]. Topic: Appointments - Scheduling Inquiry for Clinic >> Jan 20, 2024  4:20 PM Shanda MATSU wrote: Reason for CRM: Patient called in wanting to speak with provider directly to see if there is any way possible she and her husband can be seen on 03/08/2023, patient stated that she is out of town and will not be back in time for her appt on 02/01/2024, patient also wanted to know if there is any way possible that enough meds can be prescribed to hold her over until she can see can come in to be seen.

## 2024-01-22 ENCOUNTER — Other Ambulatory Visit: Payer: Self-pay | Admitting: Internal Medicine

## 2024-01-22 ENCOUNTER — Other Ambulatory Visit: Payer: Self-pay

## 2024-01-22 DIAGNOSIS — I10 Essential (primary) hypertension: Secondary | ICD-10-CM

## 2024-01-22 MED ORDER — CARVEDILOL 6.25 MG PO TABS
6.2500 mg | ORAL_TABLET | Freq: Two times a day (BID) | ORAL | 1 refills | Status: AC
  Filled 2024-01-22 – 2024-03-07 (×3): qty 180, 90d supply, fill #0

## 2024-01-22 NOTE — Telephone Encounter (Signed)
 You would need to have scheduler look to see if I have openings for the date she requested for her and her spouse. Which meds did she need refills on? Looking at her med list, looks like she should still have refills on everything. If she is out of town, she can request our pharmacy to mail to her.

## 2024-01-23 NOTE — Telephone Encounter (Signed)
Okay to do so? 

## 2024-01-24 ENCOUNTER — Other Ambulatory Visit: Payer: Self-pay | Admitting: Internal Medicine

## 2024-01-24 DIAGNOSIS — J449 Chronic obstructive pulmonary disease, unspecified: Secondary | ICD-10-CM

## 2024-01-25 ENCOUNTER — Other Ambulatory Visit: Payer: Self-pay

## 2024-01-25 MED ORDER — ALBUTEROL SULFATE HFA 108 (90 BASE) MCG/ACT IN AERS
2.0000 | INHALATION_SPRAY | Freq: Four times a day (QID) | RESPIRATORY_TRACT | 2 refills | Status: AC | PRN
  Filled 2024-01-25: qty 6.7, 25d supply, fill #0
  Filled 2024-03-05 – 2024-03-07 (×2): qty 6.7, 25d supply, fill #1

## 2024-02-01 ENCOUNTER — Ambulatory Visit: Admitting: Internal Medicine

## 2024-02-23 ENCOUNTER — Other Ambulatory Visit: Payer: Self-pay | Admitting: Internal Medicine

## 2024-02-23 ENCOUNTER — Other Ambulatory Visit: Payer: Self-pay

## 2024-02-23 MED ORDER — NYSTATIN 100000 UNIT/ML MT SUSP
5.0000 mL | Freq: Four times a day (QID) | OROMUCOSAL | 0 refills | Status: AC
  Filled 2024-02-23: qty 100, 5d supply, fill #0

## 2024-02-24 ENCOUNTER — Other Ambulatory Visit: Payer: Self-pay

## 2024-03-05 ENCOUNTER — Other Ambulatory Visit: Payer: Self-pay

## 2024-03-07 ENCOUNTER — Other Ambulatory Visit: Payer: Self-pay

## 2024-03-08 ENCOUNTER — Telehealth: Payer: Self-pay

## 2024-03-08 DIAGNOSIS — D369 Benign neoplasm, unspecified site: Secondary | ICD-10-CM

## 2024-03-08 NOTE — Telephone Encounter (Signed)
 Copied from CRM [id number]. Topic: Clinical - Medication Question >> Mar 08, 2024 11:37 AM Tonda B wrote: Reason for CRM: [facility] calling need a list of patients and have questions  medication please call [phone] option 2

## 2024-03-08 NOTE — Telephone Encounter (Signed)
 Routing to CMA

## 2024-03-08 NOTE — Telephone Encounter (Signed)
 Called [employer] for further clarification due to unclear message and spoke to [place]. Clarified that a medicine list is being requested. Med list successfully faxed on 03/08/2024. Fax #: [phone].

## 2024-03-10 ENCOUNTER — Telehealth: Payer: Self-pay | Admitting: Hematology and Oncology

## 2024-03-10 NOTE — Telephone Encounter (Signed)
 I SPOKE WITH PATIENT AND SHE IS AWARE OF RESCHEDULED MD APPOINTMENT FROM 04/01/2024 TO 04/13/2024.

## 2024-03-24 ENCOUNTER — Ambulatory Visit: Attending: Internal Medicine | Admitting: Internal Medicine

## 2024-03-24 ENCOUNTER — Encounter: Payer: Self-pay | Admitting: Internal Medicine

## 2024-03-24 VITALS — BP 136/76 | HR 71 | Temp 97.8°F | Ht 65.0 in | Wt 198.0 lb

## 2024-03-24 DIAGNOSIS — Z2821 Immunization not carried out because of patient refusal: Secondary | ICD-10-CM | POA: Diagnosis not present

## 2024-03-24 DIAGNOSIS — I10 Essential (primary) hypertension: Secondary | ICD-10-CM

## 2024-03-24 DIAGNOSIS — Z6832 Body mass index (BMI) 32.0-32.9, adult: Secondary | ICD-10-CM | POA: Diagnosis not present

## 2024-03-24 DIAGNOSIS — I7409 Other arterial embolism and thrombosis of abdominal aorta: Secondary | ICD-10-CM | POA: Diagnosis not present

## 2024-03-24 DIAGNOSIS — D0512 Intraductal carcinoma in situ of left breast: Secondary | ICD-10-CM | POA: Diagnosis not present

## 2024-03-24 DIAGNOSIS — E66811 Obesity, class 1: Secondary | ICD-10-CM | POA: Diagnosis not present

## 2024-03-24 DIAGNOSIS — J449 Chronic obstructive pulmonary disease, unspecified: Secondary | ICD-10-CM | POA: Diagnosis not present

## 2024-03-24 DIAGNOSIS — I739 Peripheral vascular disease, unspecified: Secondary | ICD-10-CM

## 2024-03-24 DIAGNOSIS — R7303 Prediabetes: Secondary | ICD-10-CM

## 2024-03-24 DIAGNOSIS — E6609 Other obesity due to excess calories: Secondary | ICD-10-CM | POA: Diagnosis not present

## 2024-03-24 LAB — GLUCOSE, POCT (MANUAL RESULT ENTRY)
POC Glucose: 61 mg/dL — AB (ref 70–99)
POC Glucose: 110 mg/dL — AB (ref 70–99)

## 2024-03-24 LAB — POCT GLYCOSYLATED HEMOGLOBIN (HGB A1C): HbA1c, POC (prediabetic range): 6 % (ref 5.7–6.4)

## 2024-03-24 NOTE — Progress Notes (Signed)
 "   Patient ID: Brandy Stevenson, female    DOB: 08-07-1955  MRN: [id number]  CC: Hypertension (HTN & pre-diabetes./Reports LT breast drainage - awaiting US  order from Dr.Cornette /No to all vax.)   Subjective: Brandy Stevenson is a 69 y.o. female who presents for chronic ds management. Her chronic medical issues include:  Hx of PAD (axillobifemoral bypass graft BL 10/2021 ), HTN, HL, former tob dep, LT breast CA (lumpectomy and XRT 2009, lumpectomy 06/2023 post recurrence), GERD, COPD, Trigeminal neuralgia RT, dep/anxiety, OA shoulders, Vit D def   Discussed the use of AI scribe software for clinical note transcription with the patient, who gave verbal consent to proceed.  History of Present Illness [name] is a 69 year old female with hypertension, breast cancer, and COPD who presents for follow-up of her chronic medical conditions.  HTN: Her blood pressure management includes hydralazine  50 mg three times a day, spironolactone  25 mg 1.5 tabs daily, and carvedilol  6.25 mg twice a day. She does not regularly monitor her blood pressure but is limiting salt intake. She reports that her leg swelling has gone down and she uses compression stockings.  She experienced dry, itchy skin on her foot two weeks ago and was treated at urgent care with triamcinolone cream and hydroxyzine 25 mg at bedtime, which have alleviated the itching.  She continues to have drainage from her left breast following breast cancer treatment. Initially minimal, the drainage increased after drain removal but has since decreased. Saw the surgeon in f/u earlier this mth. Due for MMG but due to lymphe-edema with drainage from breast, he wants her to have just breast US  for now. There was confusion between an ultrasound and a mammogram order  and she has been in contact with her surgeon's office regarding the correct test.  PreDM/obesity: Her A1c is 6, indicating prediabetes, and her blood sugar was 61 after  eating around 10:30-11:00 AM. Given 2 glucose tabs today while here. She has lost three pounds since her last visit, now weighing 198 pounds. She is mindful of her portions and engages in physical activity while caring for her mother.  COPD: She reports no major COPD exacerbations and uses Advair  twice daily, Spiriva  three times a week, and albuterol  as needed. Spiriva  causes throat discomfort, making her sound like she has a cold.  Regarding peripheral artery disease, she recalls a visit with Dr.Cain in August 2025. Reports no claudication symptoms at this time.  She continues to take aspirin  and atorvastatin .  She is currently spending time in [facility] caring for her mother, who has high blood pressure and will likely relocate there permanently both she and her husband.  She wants to maintain her doctors here in [place] for as long as she can.    Patient Active Problem List   Diagnosis Date Noted   Ductal carcinoma in situ (DCIS) of left breast 05/18/2023   Osteopenia of femoral neck 10/22/2022   Prediabetes 06/15/2021   Influenza vaccine refused 02/03/2020   Resistant hypertension 01/20/2020   Tubular adenoma of colon 04/27/2019   Cervical radiculopathy 10/01/2018   Hypertensive retinopathy 09/08/2018   Seasonal allergies 06/29/2018   Aortoiliac occlusive disease (HCC) 11/04/2017   DOE (dyspnea on exertion) 11/03/2017   Chronic chest wall pain 11/03/2017   Ichthyosis 10/06/2017   Former smoker 04/07/2017   Primary osteoarthritis of right shoulder 04/07/2017   Chronic midline low back pain without sciatica 04/07/2017   Hiatal hernia 05/01/2016   DJD (degenerative joint  disease) of cervical spine 04/17/2016   History of breast cancer 02/18/2016   Foot callus 02/18/2016   Anxiety and depression 05/22/2015   Insomnia 03/15/2015   Vitamin D  deficiency 04/08/2013   Hyperlipidemia with target LDL less than 70 04/08/2013   Atherosclerotic PVD with intermittent claudication  (HCC) 03/22/2013   Accelerated hypertension 02/23/2013   GERD (gastroesophageal reflux disease) 02/23/2013   COPD (chronic obstructive pulmonary disease) (HCC) 02/23/2013     Medications Ordered Prior to Encounter[1]  Allergies[2]  Social History   Socioeconomic History   Marital status: Married    Spouse name: Not on file   Number of children: Not on file   Years of education: Not on file   Highest education level: Some college, no degree  Occupational History   Not on file  Tobacco Use   Smoking status: Former    Current packs/day: 0.00    Average packs/day: 0.3 packs/day for 40.0 years (10.0 ttl pk-yrs)    Types: Cigarettes    Start date: 02/08/1977    Quit date: 02/08/2017    Years since quitting: 7.1    Passive exposure: Never   Smokeless tobacco: Never  Vaping Use   Vaping status: Never Used  Substance and Sexual Activity   Alcohol use: No   Drug use: No   Sexual activity: Yes    Birth control/protection: Post-menopausal, Surgical    Comment: BTL  Other Topics Concern   Not on file  Social History Narrative   Not on file   Social Drivers of Health   Tobacco Use: Low Risk (03/08/2024)   [facility]   Patient History    Passive Exposure: Not on file    Smoking Tobacco Use: Never    Smokeless Tobacco Use: Never  Recent Concern: Tobacco Use - Medium Risk (03/07/2024)   [facility] from [facility]   Patient History    Smoking Tobacco Use: Former    Smokeless Tobacco Use: Never    Passive Exposure: Not on file  Financial Resource Strain: Low Risk (10/27/2023)   Overall Financial Resource Strain (CARDIA)    Difficulty of Paying Living Expenses: Not hard at all  Food Insecurity: No Food Insecurity (10/27/2023)   Epic    Worried About Radiation Protection Practitioner of Food in the Last Year: Never true    Ran Out of Food in the Last Year: Never true  Transportation Needs: No Transportation Needs (10/27/2023)   Epic    Lack of  Transportation (Medical): No    Lack of Transportation (Non-Medical): No  Physical Activity: Inactive (10/27/2023)   Exercise Vital Sign    Days of Exercise per Week: 0 days    Minutes of Exercise per Session: 0 min  Stress: No Stress Concern Present (10/27/2023)   [employer] of Occupational Health - Occupational Stress Questionnaire    Feeling of Stress: Not at all  Recent Concern: Stress - Stress Concern Present (09/28/2023)   [employer] of Occupational Health - Occupational Stress Questionnaire    Feeling of Stress: Rather much  Social Connections: Socially Integrated (10/27/2023)   Social Connection and Isolation Panel    Frequency of Communication with Friends and Family: More than three times a week    Frequency of Social Gatherings with Friends and Family: Three times a week    Attends Religious Services: More than 4 times per year    Active Member of Clubs or Organizations: Yes    Attends Banker Meetings:  1 to 4 times per year    Marital Status: Married  Catering Manager Violence: Not At Risk (10/27/2023)   Epic    Fear of Current or Ex-Partner: No    Emotionally Abused: No    Physically Abused: No    Sexually Abused: No  Depression (PHQ2-9): Low Risk (12/14/2023)   Depression (PHQ2-9)    PHQ-2 Score: 2  Alcohol Screen: Low Risk (10/27/2023)   Alcohol Screen    Last Alcohol Screening Score (AUDIT): 0  Housing: Low Risk (10/27/2023)   Epic    Unable to Pay for Housing in the Last Year: No    Number of Times Moved in the Last Year: 0    Homeless in the Last Year: No  Utilities: Not At Risk (10/27/2023)   Epic    Threatened with loss of utilities: No  Health Literacy: Adequate Health Literacy (10/27/2023)   B1300 Health Literacy    Frequency of need for help with medical instructions: Never    Family History  Problem Relation Age of Onset   Hyperlipidemia Mother    Hypertension Mother    Heart disease Mother        Atrial Fib.    Hyperlipidemia Sister    Hypertension Sister    Heart disease Sister        Before age 1,  CHF   Heart attack Brother    Hyperlipidemia Brother    Hypertension Brother    Heart disease Brother    Cancer Maternal Uncle        unknown type; mets   Gastric cancer Maternal Uncle    Prostate cancer Maternal Uncle        mets?   Gastric cancer Paternal Grandfather        dx >50   Hypertension Daughter    Breast cancer Cousin 61       maternal female cousin   Breast cancer Cousin        paternal female cousin; dx <50    Past Surgical History:  Procedure Laterality Date   APPENDECTOMY  1980   AXILLARY-FEMORAL BYPASS GRAFT Bilateral 10/29/2021   Procedure: LEFT AXILLOBIFEMORAL BYPASS GRAFT;  Surgeon: Eliza Lonni RAMAN, MD;  Location: [facility] OR;  Service: Vascular;  Laterality: Bilateral;   BREAST BIOPSY Left 05/12/2023   US  LT BREAST BX W LOC DEV 1ST LESION IMG BX SPEC US  GUIDE 05/12/2023 [facility]   BREAST BIOPSY  06/09/2023   MM LT RADIOACTIVE SEED LOC MAMMO GUIDE 06/09/2023 [facility]   BREAST LUMPECTOMY Left    s/p radiation therapy    BREAST LUMPECTOMY WITH RADIOACTIVE SEED LOCALIZATION Left 06/11/2023   Procedure: BREAST LUMPECTOMY WITH RADIOACTIVE SEED LOCALIZATION;  Surgeon: Vanderbilt Ned, MD;  Location: [facility];  Service: General;  Laterality: Left;  LEFT BREAST SEED LUMPECTOMY   BREAST SURGERY     COLONOSCOPY  07/2015   COLONOSCOPY WITH PROPOFOL  N/A 06/26/2015   Procedure: COLONOSCOPY WITH PROPOFOL ;  Surgeon: Gladis MARLA Louder, MD;  Location: [facility];  Service: Endoscopy;  Laterality: N/A;   CT CTA CORONARY W/CA SCORE W/CM &/OR WO/CM  12/10/2017   Coronary calcium  score is 0.  No evidence of CAD.   PERIPHERAL VASCULAR CATHETERIZATION N/A 10/22/2015   Procedure: Abdominal Aortogram w/Lower Extremity;  Surgeon: Lonni RAMAN Eliza, MD;  [facility] INVASIVE CV LAB: Aortic arch widely patent as far as innominate artery, right subclavian  artery, right vertebral artery and right, common carotid.  Bilateral single renal arteries - Nonocclusive  disease. Infrarenal aorta 100% - bilateral common Iliac 100% --> B Ex Iliacs fill via Int Iliac. Leg OK   TRANSTHORACIC ECHOCARDIOGRAM  08/2017   EF 60-65%. No RWMA.  Normal DF.  Normal valves.    TUBAL LIGATION  1991    ROS: Review of Systems Negative except as stated above  PHYSICAL EXAM: BP 136/76 (BP Location: Left Arm, Patient Position: Sitting, Cuff Size: Large)   Pulse 71   Temp 97.8 F (36.6 C) (Oral)   Ht 5' 5 (1.651 m)   Wt 198 lb (89.8 kg)   SpO2 97%   BMI 32.95 kg/m   Wt Readings from Last 3 Encounters:  03/24/24 198 lb (89.8 kg)  12/15/23 201 lb (91.2 kg)  12/14/23 201 lb 9.6 oz (91.4 kg)    Physical Exam  General appearance - alert, well appearing, older AAF and in no distress Mental status - normal mood, behavior, speech, dress, motor activity, and thought processes Neck - supple, no significant adenopathy Chest - clear to auscultation, no wheezes, rales or rhonchi, symmetric air entry Heart - normal rate, regular rhythm, normal S1, S2, no murmurs, rubs, clicks or gallops Extremities - trace edema LEs      Latest Ref Rng & Units 05/20/2023   11:24 AM 12/25/2022    9:55 AM 10/30/2021    5:30 AM  CMP  Glucose 70 - 99 mg/dL 99  93  885   BUN 8 - 23 mg/dL 13  12  10    Creatinine 0.44 - 1.00 mg/dL 9.20  9.20  9.15   Sodium 135 - 145 mmol/L 139  144  137   Potassium 3.5 - 5.1 mmol/L 4.5  4.2  3.7   Chloride 98 - 111 mmol/L 106  104  100   CO2 22 - 32 mmol/L 30  26  24    Calcium  8.9 - 10.3 mg/dL 9.8  9.3  9.0   Total Protein 6.5 - 8.1 g/dL 7.9  7.3    Total Bilirubin 0.0 - 1.2 mg/dL 0.5  0.3    Alkaline Phos 38 - 126 U/L 78  111    AST 15 - 41 U/L 22  33    ALT 0 - 44 U/L 33  46     Lipid Panel     Component Value Date/Time   CHOL 146 12/25/2022 0955   TRIG 77 12/25/2022 0955   HDL 69 12/25/2022 0955   CHOLHDL 2.1 12/25/2022 0955   CHOLHDL 2.2  10/30/2021 0530   VLDL 10 10/30/2021 0530   LDLCALC 62 12/25/2022 0955    CBC    Component Value Date/Time   WBC 4.2 05/20/2023 1124   WBC 8.2 11/01/2021 1206   RBC 3.85 (L) 05/20/2023 1124   HGB 12.4 05/20/2023 1124   HGB 11.1 12/25/2022 0955   HGB 13.8 06/12/2008 0939   HCT 38.0 05/20/2023 1124   HCT 35.3 12/25/2022 0955   HCT 40.2 06/12/2008 0939   PLT 254 05/20/2023 1124   PLT 256 12/25/2022 0955   MCV 98.7 05/20/2023 1124   MCV 98 (H) 12/25/2022 0955   MCV 90.1 06/12/2008 0939   MCH 32.2 05/20/2023 1124   MCHC 32.6 05/20/2023 1124   RDW 13.2 05/20/2023 1124   RDW 12.7 12/25/2022 0955   RDW 13.9 06/12/2008 0939   LYMPHSABS 1.9 05/20/2023 1124   LYMPHSABS 0.4 (L) 03/10/2017 1335   LYMPHSABS 1.5 06/12/2008 0939   MONOABS 0.7 05/20/2023 1124   MONOABS 0.6 06/12/2008 9060  EOSABS 0.1 05/20/2023 1124   EOSABS 0.1 03/10/2017 1335   BASOSABS 0.1 05/20/2023 1124   BASOSABS 0.0 03/10/2017 1335   BASOSABS 0.0 06/12/2008 0939    ASSESSMENT AND PLAN: 1. Essential hypertension (Primary) Close to goal.  Continue current medications including hydralazine  50 mg 3 times a day, spironolactone  37.5 mg daily and carvedilol  6.25 mg twice a day Advised to monitor blood pressure at home at least twice a week with goal being 130/80 or lower. - CBC - Comprehensive metabolic panel with GFR - Lipid panel  2. Class 1 obesity due to excess calories with serious comorbidity and body mass index (BMI) of 32.0 to 32.9 in adult 3. Prediabetes Discussed and encouraged healthy eating habits and smaller portions.  Encouraged to continue to try to move is much as she can. - POCT glucose (manual entry) - POCT glycosylated hemoglobin (Hb A1C) - POCT glucose (manual entry)  4. Ductal carcinoma in situ (DCIS) of left breast Reportedly still experiencing some drainage from the breast though it has decreased.  Her surgeon has ordered an ultrasound.  She continues on Arimidex .  5. Aortoiliac  occlusive disease (HCC) 6. PAD (peripheral artery disease) Stable.  Continue aspirin  and atorvastatin  80 mg daily. - Lipid panel  7. Chronic obstructive pulmonary disease, unspecified COPD type (HCC) Stable.  She will continue Advair , Spiriva  and albuterol  inhalers.  8. Influenza vaccination declined     Patient was given the opportunity to ask questions.  Patient verbalized understanding of the plan and was able to repeat key elements of the plan.   This documentation was completed using Paediatric nurse.  Any transcriptional errors are unintentional.  Orders Placed This Encounter  Procedures   POCT glucose (manual entry)   POCT glycosylated hemoglobin (Hb A1C)     Requested Prescriptions    No prescriptions requested or ordered in this encounter    No follow-ups on file.  Barnie Louder, MD, FACP     [1]  Current Outpatient Medications on File Prior to Visit  Medication Sig Dispense Refill   acetaminophen  (TYLENOL ) 500 MG tablet Take 1,000 mg by mouth every 8 (eight) hours.     albuterol  (PROAIR  HFA) 108 (90 Base) MCG/ACT inhaler Inhale 2 puffs into the lungs every 6 (six) hours as needed for wheezing or shortness of breath. 6.7 g 2   anastrozole  (ARIMIDEX ) 1 MG tablet Take 1 tablet (1 mg total) by mouth daily. 90 tablet 3   aspirin  EC 81 MG tablet Take 1 tablet (81 mg total) by mouth daily. 90 tablet 3   atorvastatin  (LIPITOR ) 80 MG tablet TAKE 1 TABLET BY MOUTH AT  BEDTIME 100 tablet 2   carvedilol  (COREG ) 6.25 MG tablet Take 1 tablet (6.25 mg total) by mouth 2 (two) times daily. 180 tablet 1   fluticasone -salmeterol (ADVAIR ) 250-50 MCG/ACT AEPB Inhale 1 puff into the lungs in the morning and at bedtime. 60 each 2   gabapentin  (NEURONTIN ) 300 MG capsule TAKE 1 CAPSULE BY MOUTH AT  BEDTIME 100 capsule 2   hydrALAZINE  (APRESOLINE ) 50 MG tablet Take 1 tablet (50 mg total) by mouth 3 (three) times daily. 270 tablet 3   meloxicam  (MOBIC ) 15 MG tablet  TAKE 1 TABLET BY MOUTH DAILY 100 tablet 1   Menthol, Topical Analgesic, (BIOFREEZE) 5 % PTCH Apply 0.5 patches topically daily as needed (pain).     nystatin  (MYCOSTATIN ) 100000 UNIT/ML suspension SWISH AND SWALLOW 5 MLS (500,000 UNITS TOTAL) BY MOUTH 4 (FOUR) TIMES DAILY. 100  mL 0   omeprazole  (PRILOSEC) 40 MG capsule TAKE 1 CAPSULE BY MOUTH DAILY 100 capsule 2   potassium chloride  (KLOR-CON ) 10 MEQ tablet Take 2 tablets (20 mEq total) by mouth daily. 180 tablet 1   spironolactone  (ALDACTONE ) 25 MG tablet Take 1.5 tablets (37.5 mg total) by mouth daily. 135 tablet 1   tiotropium (SPIRIVA  HANDIHALER) 18 MCG inhalation capsule Place 1 capsule (18 mcg total) into inhaler and inhale daily. 30 capsule 6   No current facility-administered medications on file prior to visit.  [2]  Allergies Allergen Reactions   Hydrocodone -Acetaminophen  Nausea And Vomiting and Other (See Comments)    Sweating   Vicodin [Hydrocodone -Acetaminophen ] Nausea And Vomiting and Other (See Comments)    Sweating   Bactrim  [Sulfamethoxazole -Trimethoprim ] Nausea And Vomiting    HA    Bupropion  Other (See Comments)    Made me feel bad and sleepy   Tramadol  Nausea And Vomiting   Umeclidinium Bromide  Other (See Comments)    Dry mouth   "

## 2024-03-24 NOTE — Patient Instructions (Signed)
" °  VISIT SUMMARY: During your visit, we reviewed your chronic medical conditions, including hypertension, COPD, breast cancer, and prediabetes. We discussed your current medications, recent symptoms, and lifestyle habits. We also addressed the confusion regarding your breast imaging orders and provided guidance on managing your conditions.  YOUR PLAN: -ESSENTIAL HYPERTENSION: Hypertension means high blood pressure. Your blood pressure is not yet at the desired level, but the swelling in your legs has improved with the adjustment of your spironolactone  dose. Continue taking hydralazine  50 mg three times a day, spironolactone  25 mg (one and a half tablets) daily, and carvedilol  6.25 mg twice a day. Please monitor your blood pressure regularly at home and report any high readings. Keep limiting your salt intake.  -CHRONIC OBSTRUCTIVE PULMONARY DISEASE (COPD): COPD is a chronic lung condition that makes it hard to breathe. You have not had any major flare-ups, but you mentioned throat irritation with Spiriva , leading to reduced use. Continue using Advair  twice daily and try to use Spiriva  daily to reduce your reliance on albuterol . Continue using albuterol  as needed, but aim to space out its use.  -PERIPHERAL ARTERY DISEASE: Peripheral artery disease is a condition where the blood vessels in your legs are narrowed or blocked. Your recent ultrasound showed normal results. Continue taking aspirin  and atorvastatin  as prescribed.  -DUCTAL CARCINOMA IN SITU OF LEFT BREAST WITH POST-SURGICAL WOUND AND LYMPHEDEMA: Ductal carcinoma in situ is a type of breast cancer that starts in the milk ducts. You have ongoing wound drainage and lymphedema (swelling) in your left breast due to radiation damage. We have contacted Dr. Milta office to clarify the imaging orders, and only an ultrasound will be performed.  -PREDIABETES: Prediabetes means your blood sugar levels are higher than normal but not high enough to be  classified as diabetes. Your A1c is 6.0%, and you have lost 3 pounds. Continue monitoring your blood sugar levels, controlling your portions, and staying physically active.  -CLASS 1 OBESITY: Class 1 obesity means your body mass index (BMI) is between 30 and 34.9. You have lost 3 pounds, which is a positive step. Keep up with your physical activity and portion control.  -GENERAL HEALTH MAINTENANCE: You declined the flu shot. Please continue with your routine health check-ups and follow the lifestyle recommendations provided.  INSTRUCTIONS: Please monitor your blood pressure regularly at home and report any high readings. Continue with your current medications and lifestyle changes. We have clarified the imaging orders for your breast, and only an ultrasound will be performed. Follow up with your primary care physician as scheduled.    Contains text generated by Abridge.   "

## 2024-03-25 ENCOUNTER — Ambulatory Visit: Payer: Self-pay | Admitting: Internal Medicine

## 2024-03-25 LAB — CBC
Hematocrit: 35.1 % (ref 34.0–46.6)
Hemoglobin: 11.3 g/dL (ref 11.1–15.9)
MCH: 30.9 pg (ref 26.6–33.0)
MCHC: 32.2 g/dL (ref 31.5–35.7)
MCV: 96 fL (ref 79–97)
Platelets: 264 x10E3/uL (ref 150–450)
RBC: 3.66 x10E6/uL — ABNORMAL LOW (ref 3.77–5.28)
RDW: 12.8 % (ref 11.7–15.4)
WBC: 4.7 x10E3/uL (ref 3.4–10.8)

## 2024-03-25 LAB — LIPID PANEL
Chol/HDL Ratio: 2.3 ratio (ref 0.0–4.4)
Cholesterol, Total: 148 mg/dL (ref 100–199)
HDL: 63 mg/dL
LDL Chol Calc (NIH): 67 mg/dL (ref 0–99)
Triglycerides: 97 mg/dL (ref 0–149)
VLDL Cholesterol Cal: 18 mg/dL (ref 5–40)

## 2024-03-25 LAB — COMPREHENSIVE METABOLIC PANEL WITH GFR
ALT: 45 IU/L — ABNORMAL HIGH (ref 0–32)
AST: 25 IU/L (ref 0–40)
Albumin: 4.4 g/dL (ref 3.9–4.9)
Alkaline Phosphatase: 96 IU/L (ref 49–135)
BUN/Creatinine Ratio: 17 (ref 12–28)
BUN: 15 mg/dL (ref 8–27)
Bilirubin Total: 0.4 mg/dL (ref 0.0–1.2)
CO2: 25 mmol/L (ref 20–29)
Calcium: 9.7 mg/dL (ref 8.7–10.3)
Chloride: 105 mmol/L (ref 96–106)
Creatinine, Ser: 0.88 mg/dL (ref 0.57–1.00)
Globulin, Total: 3.3 g/dL (ref 1.5–4.5)
Glucose: 74 mg/dL (ref 70–99)
Potassium: 4.4 mmol/L (ref 3.5–5.2)
Sodium: 145 mmol/L — ABNORMAL HIGH (ref 134–144)
Total Protein: 7.7 g/dL (ref 6.0–8.5)
eGFR: 72 mL/min/1.73

## 2024-03-28 ENCOUNTER — Other Ambulatory Visit: Payer: Self-pay

## 2024-03-28 ENCOUNTER — Other Ambulatory Visit: Payer: Self-pay | Admitting: Internal Medicine

## 2024-03-28 DIAGNOSIS — J449 Chronic obstructive pulmonary disease, unspecified: Secondary | ICD-10-CM

## 2024-03-28 MED ORDER — FLUTICASONE-SALMETEROL 250-50 MCG/ACT IN AEPB
1.0000 | INHALATION_SPRAY | Freq: Two times a day (BID) | RESPIRATORY_TRACT | 2 refills | Status: AC
  Filled 2024-03-28: qty 60, 30d supply, fill #0

## 2024-03-29 ENCOUNTER — Other Ambulatory Visit: Payer: Self-pay

## 2024-04-01 ENCOUNTER — Inpatient Hospital Stay: Admitting: Hematology and Oncology

## 2024-04-06 ENCOUNTER — Other Ambulatory Visit: Payer: Self-pay | Admitting: Internal Medicine

## 2024-04-06 ENCOUNTER — Other Ambulatory Visit: Payer: Self-pay

## 2024-04-06 DIAGNOSIS — I10 Essential (primary) hypertension: Secondary | ICD-10-CM

## 2024-04-06 MED ORDER — POTASSIUM CHLORIDE ER 10 MEQ PO TBCR: 20.0000 meq | EXTENDED_RELEASE_TABLET | Freq: Every day | ORAL | 0 refills | Status: AC

## 2024-04-06 MED ORDER — SPIRONOLACTONE 25 MG PO TABS: 37.5000 mg | ORAL_TABLET | Freq: Every day | ORAL | 0 refills | Status: AC

## 2024-04-06 NOTE — Telephone Encounter (Signed)
 Change in pharmacy remainder of Rx forwarded per patient request Requested Prescriptions  Pending Prescriptions Disp Refills   potassium chloride  (KLOR-CON ) 10 MEQ tablet 180 tablet 1    Sig: Take 2 tablets (20 mEq total) by mouth daily.     Endocrinology:  Minerals - Potassium Supplementation Passed - 04/06/2024  9:09 AM      Passed - K in normal range and within 360 days    Potassium  Date Value Ref Range Status  03/24/2024 4.4 3.5 - 5.2 mmol/L Final         Passed - Cr in normal range and within 360 days    Creatinine  Date Value Ref Range Status  05/20/2023 0.79 0.44 - 1.00 mg/dL Final   Creat  Date Value Ref Range Status  04/17/2016 0.77 0.50 - 1.05 mg/dL Final    Comment:      For patients > or = 68 years of age: The upper reference limit for Creatinine is approximately 13% higher for people identified as African-American.      Creatinine, Ser  Date Value Ref Range Status  03/24/2024 0.88 0.57 - 1.00 mg/dL Final         Passed - Valid encounter within last 12 months    Recent Outpatient Visits           1 week ago Essential hypertension   [facility] [place] [facility] [place]. [facility]ci Barnie NOVAK, MD   3 months ago Essential hypertension   [facility] [place] - [facility] Of [place]. [facility] Theotis Haze ORN, NP   4 months ago Lower extremity edema   [facility] [place] [facility] [place]. [facility] Barnie NOVAK, MD   6 months ago Essential hypertension   [facility] [facility] [place] - [facility] Of [place]. [facility] Barnie NOVAK, MD   9 months ago Ductal carcinoma in situ (DCIS) of left breast   [facility] - [facility] Of [place]. [facility] Barnie B, MD               spironolactone  (ALDACTONE ) 25 MG tablet 135 tablet 1    Sig: Take 1.5 tablets (37.5 mg total) by mouth daily.     Cardiovascular: Diuretics -  Aldosterone Antagonist Failed - 04/06/2024  9:09 AM      Failed - Na in normal range and within 180 days    Sodium  Date Value Ref Range Status  03/24/2024 145 (H) 134 - 144 mmol/L Final         Passed - Cr in normal range and within 180 days    Creatinine  Date Value Ref Range Status  05/20/2023 0.79 0.44 - 1.00 mg/dL Final   Creat  Date Value Ref Range Status  04/17/2016 0.77 0.50 - 1.05 mg/dL Final    Comment:      For patients > or = 68 years of age: The upper reference limit for Creatinine is approximately 13% higher for people identified as African-American.      Creatinine, Ser  Date Value Ref Range Status  03/24/2024 0.88 0.57 - 1.00 mg/dL Final         Passed - K in normal range and within 180 days    Potassium  Date Value Ref Range Status  03/24/2024 4.4 3.5 - 5.2 mmol/L Final  Passed - eGFR is 30 or above and within 180 days    GFR, Est African American  Date Value Ref Range Status  04/17/2016 >89 >=60 mL/min Final   GFR calc Af Amer  Date Value Ref Range Status  04/28/2019 93 >59 mL/min/1.73 Final   GFR, Est Non African American  Date Value Ref Range Status  04/17/2016 85 >=60 mL/min Final   GFR, Estimated  Date Value Ref Range Status  05/20/2023 >60 >60 mL/min Final    Comment:    (NOTE) Calculated using the CKD-EPI Creatinine Equation (2021)    eGFR  Date Value Ref Range Status  03/24/2024 72 >59 mL/min/1.73 Final         Passed - Last BP in normal range    BP Readings from Last 1 Encounters:  03/24/24 136/76         Passed - Valid encounter within last 6 months    Recent Outpatient Visits           1 week ago Essential hypertension   [facility] [place] [facility] [place]. [facility] Barnie NOVAK, MD   3 months ago Essential hypertension   [place] [facility] [place] - [facility] Of [place]. [facility] Theotis Haze ORN, NP   4 months ago Lower extremity edema   [place] [facility] [place] - [facility] Dept Of [place]. [facility] Barnie NOVAK, MD   6 months ago Essential hypertension   [facility] [facility] [place] - [facility] Dept Of [place]. [facility] Barnie NOVAK, MD   9 months ago Ductal carcinoma in situ (DCIS) of left breast   [facility] - [facility] Dept Of [place]. [facility] Barnie NOVAK, MD

## 2024-04-06 NOTE — Telephone Encounter (Signed)
 Copied from CRM #[id number]. Topic: Clinical - Medication Refill >> Apr 06, 2024  8:54 AM Myrick T wrote: Patient is in [facility] visiting her mother for a while and need her meds sent there. Patient has 3 days of medication left  Medication: potassium chloride  (KLOR-CON ) 10 MEQ tablet, spironolactone  (ALDACTONE ) 25 MG tablet  Has the patient contacted their pharmacy? Yes (Agent: If no, request that the patient contact the pharmacy for the refill. If patient does not wish to contact the pharmacy document the reason why and proceed with request.) (Agent: If yes, when and what did the pharmacy advise?)  This is the patient's preferred pharmacy:   [id number] - [place] MO[place], [place] - [address] AT [facility] OF [facility] [address] [place] [address] [place] [address] Phone: [phone] Fax: [phone]  Is this the correct pharmacy for this prescription? Yes  Has the prescription been filled recently? Yes  Is the patient out of the medication? No  Has the patient been seen for an appointment in the last year OR does the patient have an upcoming appointment? Yes  Can we respond through MyChart? Yes  Agent: Please be advised that Rx refills may take up to 3 business days. We ask that you follow-up with your pharmacy.

## 2024-04-13 ENCOUNTER — Inpatient Hospital Stay: Admitting: Hematology and Oncology

## 2024-04-26 ENCOUNTER — Encounter

## 2024-04-26 ENCOUNTER — Other Ambulatory Visit

## 2024-07-22 ENCOUNTER — Ambulatory Visit: Payer: Self-pay | Admitting: Internal Medicine

## 2024-09-28 ENCOUNTER — Ambulatory Visit: Admitting: Adult Health

## 2024-11-01 ENCOUNTER — Ambulatory Visit
